# Patient Record
Sex: Female | Born: 1975 | Race: White | Hispanic: No | Marital: Married
Health system: Southern US, Community
[De-identification: ages and names within clinical notes are randomized; demographics above are authoritative.]

## PROBLEM LIST (undated history)

## (undated) DIAGNOSIS — M549 Dorsalgia, unspecified: Secondary | ICD-10-CM

## (undated) DIAGNOSIS — R87619 Unspecified abnormal cytological findings in specimens from cervix uteri: Secondary | ICD-10-CM

## (undated) DIAGNOSIS — E785 Hyperlipidemia, unspecified: Secondary | ICD-10-CM

## (undated) DIAGNOSIS — I10 Essential (primary) hypertension: Secondary | ICD-10-CM

## (undated) HISTORY — PX: OTHER SURGICAL HISTORY: SHX169

## (undated) HISTORY — PX: WISDOM TOOTH EXTRACTION: SHX21

---

## 2000-08-06 ENCOUNTER — Other Ambulatory Visit: Admission: RE | Admit: 2000-08-06 | Discharge: 2000-08-06 | Payer: Self-pay | Admitting: Obstetrics and Gynecology

## 2000-09-08 ENCOUNTER — Inpatient Hospital Stay (HOSPITAL_COMMUNITY): Admission: AD | Admit: 2000-09-08 | Discharge: 2000-09-08 | Payer: Self-pay | Admitting: Obstetrics & Gynecology

## 2000-09-08 ENCOUNTER — Encounter: Payer: Self-pay | Admitting: Obstetrics & Gynecology

## 2001-02-15 ENCOUNTER — Inpatient Hospital Stay (HOSPITAL_COMMUNITY): Admission: AD | Admit: 2001-02-15 | Discharge: 2001-02-18 | Payer: Self-pay | Admitting: Obstetrics and Gynecology

## 2001-02-26 ENCOUNTER — Inpatient Hospital Stay (HOSPITAL_COMMUNITY): Admission: AD | Admit: 2001-02-26 | Discharge: 2001-02-26 | Payer: Self-pay | Admitting: Obstetrics and Gynecology

## 2001-10-16 ENCOUNTER — Emergency Department (HOSPITAL_COMMUNITY): Admission: EM | Admit: 2001-10-16 | Discharge: 2001-10-16 | Payer: Self-pay | Admitting: Emergency Medicine

## 2002-03-07 ENCOUNTER — Other Ambulatory Visit: Admission: RE | Admit: 2002-03-07 | Discharge: 2002-03-07 | Payer: Self-pay | Admitting: Obstetrics and Gynecology

## 2002-03-29 ENCOUNTER — Emergency Department (HOSPITAL_COMMUNITY): Admission: EM | Admit: 2002-03-29 | Discharge: 2002-03-29 | Payer: Self-pay | Admitting: *Deleted

## 2002-03-29 ENCOUNTER — Encounter: Payer: Self-pay | Admitting: *Deleted

## 2002-05-21 ENCOUNTER — Emergency Department (HOSPITAL_COMMUNITY): Admission: EM | Admit: 2002-05-21 | Discharge: 2002-05-21 | Payer: Self-pay | Admitting: *Deleted

## 2003-03-09 ENCOUNTER — Other Ambulatory Visit: Admission: RE | Admit: 2003-03-09 | Discharge: 2003-03-09 | Payer: Self-pay | Admitting: Obstetrics and Gynecology

## 2003-05-08 ENCOUNTER — Emergency Department (HOSPITAL_COMMUNITY): Admission: EM | Admit: 2003-05-08 | Discharge: 2003-05-08 | Payer: Self-pay | Admitting: Emergency Medicine

## 2010-11-14 ENCOUNTER — Other Ambulatory Visit: Payer: Self-pay | Admitting: Obstetrics and Gynecology

## 2010-11-19 ENCOUNTER — Encounter (HOSPITAL_COMMUNITY): Payer: Self-pay | Admitting: *Deleted

## 2010-12-01 NOTE — H&P (Signed)
NAME:  Stacey Montoya, Stacey Montoya NO.:  [id number]  MEDICAL RECORD NO.:  [id number]  LOCATION:  [facility]                         FACILITY:  [facility]  PHYSICIAN:  Randye Lobo, M.D.   DATE OF BIRTH:  12-05-1975  DATE OF ADMISSION:  11/14/2010 DATE OF DISCHARGE:                             HISTORY & PHYSICAL   CHIEF COMPLAINT:  Abnormal Pap smear.  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  The patient subsequently had a colposcopy on November 03, 2010 which was unsatisfactory.  There were no lesions of the cervix appreciated.  Endocervical curettage demonstrated a squamous fragment with CIN-II and otherwise benign endocervical tissue.  PAST OBSTETRIC AND GYNECOLOGIC HISTORY:  Status post cesarean section x1.  The patient has a history of polycystic ovarian disease.  She has oligomenorrhea with menses 7-9 times per year.  She has recently been started on Ortho Micronor for contraception.  PAST MEDICAL HISTORY: 1. Tobacco use.  The patient smokes 1/2 package of cigarettes per day. 2. Hyperlipidemia. 3. Obesity. 4. Potential hypertension.  PAST SURGICAL HISTORY:  Status post cesarean section x1.  MEDICATIONS:  None.  ALLERGIES:  ZITHROMAX.  SOCIAL HISTORY:  The patient has recently moved back to the [place] area from [place].  She has a 41-year-old son.  She smokes 1/2 package of cigarettes per day after previously quitting.  PHYSICAL EXAMINATION:  VITAL SIGNS:  Height 5 feet 4 inches, weight 237 pounds, blood pressure 142/86. NECK:  Negative for adenopathy and thyromegaly. LUNGS:  Clear to auscultation bilaterally. HEART:  S1, S2 with regular rate and rhythm.  No evidence of a murmur, rub, or gallop.  ABDOMEN:  There is a  well-healed Pfannenstiel incision. There is central obesity appreciated.  The abdomen is soft and nontender and without evidence of hepatosplenomegaly or organomegaly.  BREASTS: No dominant masses, skin retractions, axillary adenopathy, or nipple discharge. PELVIC:  Normal external genitalia and urethra.  There are no visible lesions of the cervix or the vagina.  The cervix is very high in the vaginal vault.  The uterus is small and nontender.  No adnexal masses nor tenderness are appreciated.  IMPRESSION:  The patient is a 35 year old, gravida 1, para 1 female with an abnormal Pap smear, nonsatisfactory colposcopic examination, and Discrepancy between a Pap smear and endocervical curettage.  PLAN:  The patient will undergo a cold knife conization of the cervix with a fractional D and C at the [facility] [place] on December 02, 2010.  Risks, benefits, and alternatives have been reviewed with the patient who wishes to proceed.     Randye Lobo, M.D.     BES/MEDQ  D:  12/01/2010  T:  12/01/2010  Job:  [id number]

## 2010-12-02 ENCOUNTER — Ambulatory Visit (HOSPITAL_COMMUNITY)
Admission: RE | Admit: 2010-12-02 | Discharge: 2010-12-02 | Disposition: A | Discharge status: Home / Self Care | Payer: PRIVATE HEALTH INSURANCE | Source: Ambulatory Visit | Attending: Obstetrics and Gynecology | Admitting: Obstetrics and Gynecology

## 2010-12-02 ENCOUNTER — Ambulatory Visit (HOSPITAL_COMMUNITY): Payer: PRIVATE HEALTH INSURANCE | Admitting: Anesthesiology

## 2010-12-02 ENCOUNTER — Encounter (HOSPITAL_COMMUNITY): Payer: Self-pay | Admitting: *Deleted

## 2010-12-02 ENCOUNTER — Encounter (HOSPITAL_COMMUNITY): Payer: Self-pay | Admitting: Anesthesiology

## 2010-12-02 ENCOUNTER — Encounter (HOSPITAL_COMMUNITY)
Admission: RE | Disposition: A | Discharge status: Home / Self Care | Payer: Self-pay | Source: Ambulatory Visit | Attending: Obstetrics and Gynecology

## 2010-12-02 ENCOUNTER — Other Ambulatory Visit: Payer: Self-pay | Admitting: Obstetrics and Gynecology

## 2010-12-02 DIAGNOSIS — D069 Carcinoma in situ of cervix, unspecified: Secondary | ICD-10-CM | POA: Insufficient documentation

## 2010-12-02 HISTORY — DX: Unspecified abnormal cytological findings in specimens from cervix uteri: R87.619

## 2010-12-02 HISTORY — DX: Hyperlipidemia, unspecified: E78.5

## 2010-12-02 HISTORY — PX: DILATION AND CURETTAGE OF UTERUS: SHX78

## 2010-12-02 HISTORY — PX: CERVICAL CONIZATION W/BX: SHX1330

## 2010-12-02 LAB — CBC
MCH: 32 pg (ref 26.0–34.0)
MCV: 98.4 fL (ref 78.0–100.0)
Platelets: 315 10*3/uL (ref 150–400)
RBC: 4.38 MIL/uL (ref 3.87–5.11)

## 2010-12-02 SURGERY — CONE BIOPSY, CERVIX
Anesthesia: Choice

## 2010-12-02 MED ORDER — PROPOFOL 10 MG/ML IV EMUL
INTRAVENOUS | Status: DC | PRN
  Administered 2010-12-02: 250 mg via INTRAVENOUS

## 2010-12-02 MED ORDER — LIDOCAINE HCL (CARDIAC) 20 MG/ML IV SOLN
INTRAVENOUS | Status: DC | PRN
  Administered 2010-12-02: 70 mg via INTRAVENOUS

## 2010-12-02 MED ORDER — LIDOCAINE-EPINEPHRINE 1 %-1:100000 IJ SOLN
INTRAMUSCULAR | Status: DC | PRN
  Administered 2010-12-02: 10 mL

## 2010-12-02 MED ORDER — ONDANSETRON HCL 4 MG/2ML IJ SOLN
INTRAMUSCULAR | Status: DC | PRN
  Administered 2010-12-02: 4 mg via INTRAVENOUS

## 2010-12-02 MED ORDER — OXYCODONE-ACETAMINOPHEN 5-325 MG PO TABS
1.0000 | ORAL_TABLET | ORAL | Status: DC | PRN
  Administered 2010-12-02: 1 via ORAL

## 2010-12-02 MED ORDER — CEFAZOLIN SODIUM 1-5 GM-% IV SOLN: 1.0000 g | Freq: Once | INTRAVENOUS | Status: DC

## 2010-12-02 MED ORDER — LACTATED RINGERS IV SOLN
INTRAVENOUS | Status: DC
  Administered 2010-12-02 (×2): via INTRAVENOUS

## 2010-12-02 MED ORDER — CEFAZOLIN SODIUM 1-5 GM-% IV SOLN
INTRAVENOUS | Status: DC | PRN
  Administered 2010-12-02: 1 g via INTRAVENOUS

## 2010-12-02 MED ORDER — OXYCODONE-ACETAMINOPHEN 5-325 MG PO TABS: 1.0000 | ORAL_TABLET | ORAL | Status: AC | PRN

## 2010-12-02 MED ORDER — CEFAZOLIN SODIUM 1-5 GM-% IV SOLN
INTRAVENOUS | Status: AC
  Filled 2010-12-02: qty 50

## 2010-12-02 MED ORDER — KETOROLAC TROMETHAMINE 30 MG/ML IJ SOLN
INTRAMUSCULAR | Status: DC | PRN
  Administered 2010-12-02: 30 mg via INTRAVENOUS

## 2010-12-02 MED ORDER — MIDAZOLAM HCL 5 MG/5ML IJ SOLN
INTRAMUSCULAR | Status: DC | PRN
  Administered 2010-12-02 (×2): 1 mg via INTRAVENOUS

## 2010-12-02 MED ORDER — DEXAMETHASONE SODIUM PHOSPHATE 4 MG/ML IJ SOLN
INTRAMUSCULAR | Status: DC | PRN
  Administered 2010-12-02: 10 mg via INTRAVENOUS

## 2010-12-02 MED ORDER — FENTANYL CITRATE 0.05 MG/ML IJ SOLN
INTRAMUSCULAR | Status: DC | PRN
  Administered 2010-12-02: 100 ug via INTRAVENOUS

## 2010-12-02 MED ORDER — IBUPROFEN 200 MG PO TABS: 600.0000 mg | ORAL_TABLET | Freq: Four times a day (QID) | ORAL | Status: AC | PRN

## 2010-12-02 MED ORDER — OXYCODONE-ACETAMINOPHEN 5-325 MG PO TABS
ORAL_TABLET | ORAL | Status: AC
  Administered 2010-12-02: 1 via ORAL
  Filled 2010-12-02: qty 1

## 2010-12-02 MED ORDER — KETOROLAC TROMETHAMINE 30 MG/ML IJ SOLN
INTRAMUSCULAR | Status: AC
  Filled 2010-12-02: qty 1

## 2010-12-02 SURGICAL SUPPLY — 30 items
APPLICATOR COTTON TIP 6IN STRL (MISCELLANEOUS) IMPLANT
BLADE SURG 11 STRL SS (BLADE) ×2 IMPLANT
CATH ROBINSON RED A/P 16FR (CATHETERS) ×2 IMPLANT
CLOTH BEACON ORANGE TIMEOUT ST (SAFETY) ×2 IMPLANT
CONTAINER PREFILL 10% NBF 60ML (FORM) ×2 IMPLANT
COUNTER NEEDLE 1200 MAGNETIC (NEEDLE) ×2 IMPLANT
DRAPE UTILITY XL STRL (DRAPES) ×2 IMPLANT
ELECT BALL LEEP 5MM RED (ELECTRODE) ×2 IMPLANT
ELECT REM PT RETURN 9FT ADLT (ELECTROSURGICAL)
ELECTRODE REM PT RTRN 9FT ADLT (ELECTROSURGICAL) IMPLANT
GAUZE SPONGE 4X4 16PLY XRAY LF (GAUZE/BANDAGES/DRESSINGS) IMPLANT
GLOVE BIO SURGEON STRL SZ 6.5 (GLOVE) ×4 IMPLANT
GOWN PREVENTION PLUS LG XLONG (DISPOSABLE) ×4 IMPLANT
NEEDLE SPNL 22GX3.5 QUINCKE BK (NEEDLE) IMPLANT
NS IRRIG 1000ML POUR BTL (IV SOLUTION) ×2 IMPLANT
PACK VAGINAL MINOR WOMEN LF (CUSTOM PROCEDURE TRAY) ×2 IMPLANT
PAD PREP 24X48 CUFFED NSTRL (MISCELLANEOUS) ×2 IMPLANT
PENCIL BUTTON HOLSTER BLD 10FT (ELECTRODE) ×2 IMPLANT
SCOPETTES 8  STERILE (MISCELLANEOUS) ×1
SCOPETTES 8 STERILE (MISCELLANEOUS) ×1 IMPLANT
SPONGE SURGIFOAM ABS GEL 12-7 (HEMOSTASIS) IMPLANT
SUT CHROMIC 1 CT1 27 (SUTURE) ×6 IMPLANT
SUT SILK 2 0 SH (SUTURE) ×2 IMPLANT
SUT VIC AB 0 CT1 27 (SUTURE)
SUT VIC AB 0 CT1 27XBRD ANBCTR (SUTURE) IMPLANT
SYR CONTROL 10ML LL (SYRINGE) ×2 IMPLANT
TOWEL OR 17X24 6PK STRL BLUE (TOWEL DISPOSABLE) ×4 IMPLANT
TUBING NON-CON 1/4 X 20 CONN (TUBING) ×2 IMPLANT
WATER STERILE IRR 1000ML POUR (IV SOLUTION) ×2 IMPLANT
YANKAUER SUCT BULB TIP NO VENT (SUCTIONS) ×2 IMPLANT

## 2010-12-02 NOTE — Progress Notes (Signed)
Pre-op Note  No marked change in status since office pre-op visit. 

## 2010-12-02 NOTE — Anesthesia Postprocedure Evaluation (Signed)
Anesthesia Post Note  Patient: Stacey Montoya  Procedure(s) Performed:  CONIZATION CERVIX WITH BIOPSY - COLD KNIFE; DILATATION AND CURETTAGE (D&C)  Anesthesia type: General  Patient location: PACU  Post pain: Pain level controlled  Post assessment: Post-op Vital signs reviewed  Last Vitals:  Filed Vitals:   12/02/10 1235  BP:   Pulse: 99  Temp: 98.4 F (36.9 C)  Resp: 15    Post vital signs: Reviewed  Level of consciousness: sedated  Complications: No apparent anesthesia complications

## 2010-12-02 NOTE — Transfer of Care (Signed)
Immediate Anesthesia Transfer of Care Note  Patient: Stacey Montoya  Procedure(s) Performed:  CONIZATION CERVIX WITH BIOPSY - COLD KNIFE; DILATATION AND CURETTAGE (D&C)  Patient Location: PACU  Anesthesia Type: General  Level of Consciousness: awake and alert   Airway & Oxygen Therapy: Patient Spontanous Breathing and Patient connected to nasal cannula oxygen  Post-op Assessment: Report given to PACU RN and Post -op Vital signs reviewed and stable  Post vital signs: Reviewed and stable  Complications: No apparent anesthesia complications

## 2010-12-02 NOTE — Op Note (Signed)
NAME:  Stacey Montoya, Stacey Montoya NO.:  [id number]  MEDICAL RECORD NO.:  [id number]  LOCATION:  [facility]                          FACILITY:  [facility]  PHYSICIAN:  Randye Lobo, M.D.   DATE OF BIRTH:  Dec 07, 1975  DATE OF PROCEDURE:  12/02/2010 DATE OF DISCHARGE:  12/02/2010                              OPERATIVE REPORT   PREOPERATIVE DIAGNOSES: 1. Unsatisfactory colposcopy 2. Discrepancy between Pap smear and colposcopic biopsy.  POSTOPERATIVE DIAGNOSES: 1. Unsatisfactory colposcopy. 2. Discrepancy between Pap smear and colposcopic biopsies.  PROCEDURE:  Cold knife conization of the cervix, fractional D and C.  SURGEON:  Randye Lobo, MD  ANESTHESIA:  General endotracheal, local with 1% lidocaine with epinephrine 1:100,000.  IV FLUIDS:  __________  ESTIMATED BLOOD LOSS:  Minimal.  URINE OUTPUT:  200 mL prior to procedure.  COMPLICATIONS:  None.  INDICATIONS FOR THE PROCEDURE:  The patient is a 35 year old, gravida 1, para 1 Caucasian female with a history of a prior cesarean section, who presents for a routine examination.  The patient has not had a Pap smear since 2004.  The patient was noted to have CIN-II/CIN-III/carcinoma in situ on her Pap smear performed on October 10, 2010.  Colposcopy was performed on November 03, 2010, and this was unsatisfactory.  No lesions were seen.  Endocervical curettage demonstrated a squamous fragment was CIN-II and otherwise benign endocervical tissue.  The cervix was noted to be very high in the vagina.  Recommendation was made to proceed with a cold knife conization of the cervix after risks, benefits, and alternatives were reviewed.  FINDINGS:  Examination under anesthesia revealed a visibly and palpably normal cervix.  Monsel's solution was applied to the cervix after placement of the speculum.  The squamocolumnar junction was visualized only anteriorly.  SPECIMENS:  Conization of cervix, endocervical curettings, and  endometrial curettings were sent to pathology separately.  PROCEDURE:  The patient was sterilely prepped and draped.  A red rubber catheter was used to empty the bladder.  Lugol's solution was used to paint the cervix. Local 1% lidocaine was injected circumferentially to the cervix.  Figure-of-8 sutures of #1 chromic were placed at the 3 and the 9 o'clock positions along the descending branches of the uterine arteries along the cervix.  Deaver retractors were used to retract the vaginal sidewalls and a tenaculum was placed on the cervix anteriorly.  A scalpel was then used to perform the cold knife conization, which was performed without difficulty.  The specimen was sharply excised at the base and the cervix was marked at the 12 o'clock position.  The endocervix was curetted, and the specimen was sent to pathology.  The endometrium was similarly curetted, and the specimen was sent to pathology.  The surgical bed of the conization was then cauterized with monopolar cautery without cauterizing the endocervix.  Hemostasis was noted to be excellent. Monsel's solution was applied.  All traction was taken off the cervix, and there was still good hemostasis.  The procedure was therefore completed at this time.  All of the vaginal instruments were removed.  The patient was awakened and extubated and escorted to the recovery room in stable condition. There were no complications to the procedure.  All needle, instrument, and sponge counts were correct.     Randye Lobo, M.D.     BES/MEDQ  D:  12/02/2010  T:  12/02/2010  Job:  [id number]

## 2010-12-02 NOTE — H&P (Signed)
NAME:  TERRINA, DOCTER NO.:  [id number]      MEDICAL RECORD NO.:  [id number]      LOCATION:  [facility]                         [facility]      PHYSICIAN:  Randye Lobo, M.D.   DATE OF BIRTH:  November 24, 1975      DATE OF ADMISSION:  11/14/2010   DATE OF DISCHARGE:                                 HISTORY & PHYSICAL         CHIEF COMPLAINT:  Abnormal Pap smear.      HISTORY OF PRESENT ILLNESS:  The patient is a 35 year old, gravida 1,   para 1 Caucasian female who presented for a routine examination on   October 10, 2010 at which time she was noted to have a Pap smear showing   high-grade squamous intraepithelial lesion:  CIN-II/CIN-III/carcinoma in   situ.  The patient subsequently had a colposcopy on November 03, 2010   which was unsatisfactory.  There were no lesions of the cervix   appreciated.  Endocervical curettage demonstrated a squamous fragment   with CIN-II and otherwise benign endocervical tissue.      PAST OBSTETRIC AND GYNECOLOGIC HISTORY:  Status post cesarean section   x1.  The patient has a history of polycystic ovarian disease.  She has   oligomenorrhea with menses 7-9 times per year.  She has recently been   started on Ortho Micronor for contraception.      PAST MEDICAL HISTORY:   1. Tobacco use.  The patient smokes 1/2 package of cigarettes per day.   2. Hyperlipidemia.   3. Obesity.   4. Potential hypertension.      PAST SURGICAL HISTORY:  Status post cesarean section x1.      MEDICATIONS:  None.      ALLERGIES:  ZITHROMAX.      SOCIAL HISTORY:  The patient has recently moved back to the [place]   area from [place].  She has a 37-year-old son.  She smokes 1/2 package   of cigarettes per day after previously quitting.      PHYSICAL EXAMINATION:  VITAL SIGNS:  Height 5 feet 4 inches, weight 237   pounds, blood pressure 142/86.   NECK:  Negative for adenopathy and thyromegaly.   LUNGS:  Clear to auscultation bilaterally.   HEART:   S1, S2 with regular rate and rhythm.  No evidence of a murmur,   rub, or gallop.  ABDOMEN:  There is a well-healed Pfannenstiel incision.   There is central obesity appreciated.  The abdomen is soft and nontender   and without evidence of hepatosplenomegaly or organomegaly.  BREASTS:   No dominant masses, skin retractions, axillary adenopathy, or nipple   discharge.   PELVIC:  Normal external genitalia and urethra.  There are no visible   lesions of the cervix or the vagina.  The cervix is very high in the   vaginal vault.  The uterus is small and nontender.  No adnexal masses   nor tenderness are appreciated.      IMPRESSION:  The patient is a 35 year old, gravida 1, para 1 female with   an abnormal Pap smear, nonsatisfactory colposcopic examination, and  potential discrepancy between a Pap smear and endocervical curettage.      PLAN:  The patient will undergo a cold knife conization of the cervix   with a fractional D and C at the [facility] [place] on   December 02, 2010.  Risks, benefits, and alternatives have been reviewed   with the patient who wishes to proceed.               Randye Lobo, M.D.               BES/MEDQ  D:  12/01/2010  T:  12/01/2010  Job:  [id number]

## 2010-12-02 NOTE — Anesthesia Preprocedure Evaluation (Addendum)
Anesthesia Evaluation  Name, MR# and DOB Patient awake  General Assessment Comment  Reviewed: Allergy & Precautions, H&P , NPO status , Patient's Chart, lab work & pertinent test results, reviewed documented beta blocker date and time   History of Anesthesia Complications Negative for: history of anesthetic complications  Airway Mallampati: I TM Distance: >3 FB Neck ROM: full    Dental  (+) Teeth Intact   Pulmonary Current Smoker  clear to auscultation  Pulmonary exam normal       Cardiovascular Exercise Tolerance: Good regular Normal    Neuro/Psych Negative Neurological ROS  Negative Psych ROS   GI/Hepatic negative GI ROS Neg liver ROS    Endo/Other  Morbid obesityPCOS  Renal/GU      Musculoskeletal   Abdominal   Peds  Hematology negative hematology ROS (+)   Anesthesia Other Findings   Reproductive/Obstetrics negative OB ROS                          Anesthesia Physical Anesthesia Plan  ASA: II  Anesthesia Plan: General   Post-op Pain Management:    Induction:   Airway Management Planned:   Additional Equipment:   Intra-op Plan:   Post-operative Plan:   Informed Consent: I have reviewed the patients History and Physical, chart, labs and discussed the procedure including the risks, benefits and alternatives for the proposed anesthesia with the patient or authorized representative who has indicated his/her understanding and acceptance.   Dental Advisory Given  Plan Discussed with: CRNA and Surgeon  Anesthesia Plan Comments:         Anesthesia Quick Evaluation

## 2010-12-02 NOTE — Brief Op Note (Signed)
12/02/2010  11:43 AM  PATIENT:  Stacey Montoya  35 y.o. female  PRE-OPERATIVE DIAGNOSIS:  DISCREPANCY BETWEEN PAP AND COLPOSCOPIC BIOPSIES UNSATISFACTORY COLPOSCOPY  POST-OPERATIVE DIAGNOSIS:   DISCREPANCY BETWEEN PAP AND COLPOSCOPIC BIOPSIES UNSATISFACTORY COLPOSCOPY    PROCEDURE:  Procedure(s): CONIZATION CERVIX WITH BIOPSY FRACTIONAL DILATATION AND CURETTAGE (D&C)  SURGEON:  Surgeon(s): Brook A Silva     PHYSICIAN ASSISTANT:   ASSISTANTS:    ANESTHESIA:   general, paracervical block 1% lidocaine with epinephrine 1:100,000  EBL:  Total I/O In: 1000 [I.V.:1000] Out: -   BLOOD ADMINISTERED:none  DRAINS: none   LOCAL MEDICATIONS USED:  LIDOCAINE 10 CC  SPECIMEN:  Source of Specimen:  Conization of cervix - marked at 12:00, endocervical and endometrial curettings  DISPOSITION OF SPECIMEN:  PATHOLOGY  COUNTS:  YES  TOURNIQUET:  * No tourniquets in log *  DICTATION: .Other Dictation: Dictation Number   PLAN OF CARE: Discharge to home after PACU  PATIENT DISPOSITION:  PACU - hemodynamically stable.   Delay start of Pharmacological VTE agent (>24hrs) due to surgical blood loss or risk of bleeding:  not applicable

## 2010-12-08 ENCOUNTER — Encounter (HOSPITAL_COMMUNITY): Payer: Self-pay | Admitting: Obstetrics and Gynecology

## 2011-07-31 ENCOUNTER — Other Ambulatory Visit: Payer: Self-pay | Admitting: Obstetrics and Gynecology

## 2013-03-02 ENCOUNTER — Other Ambulatory Visit: Payer: Self-pay | Admitting: Obstetrics and Gynecology

## 2014-03-06 ENCOUNTER — Other Ambulatory Visit: Payer: Self-pay | Admitting: Obstetrics and Gynecology

## 2014-03-07 LAB — CYTOLOGY - PAP

## 2015-07-19 ENCOUNTER — Other Ambulatory Visit: Payer: Self-pay | Admitting: Obstetrics and Gynecology

## 2015-07-22 LAB — CYTOLOGY - PAP

## 2015-11-25 DIAGNOSIS — Z6839 Body mass index (BMI) 39.0-39.9, adult: Secondary | ICD-10-CM | POA: Diagnosis not present

## 2015-11-25 DIAGNOSIS — R6889 Other general symptoms and signs: Secondary | ICD-10-CM | POA: Diagnosis not present

## 2016-07-01 DIAGNOSIS — D485 Neoplasm of uncertain behavior of skin: Secondary | ICD-10-CM | POA: Diagnosis not present

## 2016-07-01 DIAGNOSIS — D2361 Other benign neoplasm of skin of right upper limb, including shoulder: Secondary | ICD-10-CM | POA: Diagnosis not present

## 2016-09-15 DIAGNOSIS — Z01419 Encounter for gynecological examination (general) (routine) without abnormal findings: Secondary | ICD-10-CM | POA: Diagnosis not present

## 2016-09-15 DIAGNOSIS — Z124 Encounter for screening for malignant neoplasm of cervix: Secondary | ICD-10-CM | POA: Diagnosis not present

## 2016-09-15 DIAGNOSIS — Z6841 Body Mass Index (BMI) 40.0 and over, adult: Secondary | ICD-10-CM | POA: Diagnosis not present

## 2016-10-28 DIAGNOSIS — J209 Acute bronchitis, unspecified: Secondary | ICD-10-CM | POA: Diagnosis not present

## 2016-10-28 DIAGNOSIS — J329 Chronic sinusitis, unspecified: Secondary | ICD-10-CM | POA: Diagnosis not present

## 2016-10-28 DIAGNOSIS — J069 Acute upper respiratory infection, unspecified: Secondary | ICD-10-CM | POA: Diagnosis not present

## 2017-07-27 DIAGNOSIS — H5213 Myopia, bilateral: Secondary | ICD-10-CM | POA: Diagnosis not present

## 2017-07-27 DIAGNOSIS — H52223 Regular astigmatism, bilateral: Secondary | ICD-10-CM | POA: Diagnosis not present

## 2017-09-14 DIAGNOSIS — J069 Acute upper respiratory infection, unspecified: Secondary | ICD-10-CM | POA: Diagnosis not present

## 2017-09-14 DIAGNOSIS — J029 Acute pharyngitis, unspecified: Secondary | ICD-10-CM | POA: Diagnosis not present

## 2017-10-27 DIAGNOSIS — N879 Dysplasia of cervix uteri, unspecified: Secondary | ICD-10-CM | POA: Diagnosis not present

## 2017-11-05 DIAGNOSIS — Z0000 Encounter for general adult medical examination without abnormal findings: Secondary | ICD-10-CM | POA: Diagnosis not present

## 2017-11-11 DIAGNOSIS — Z720 Tobacco use: Secondary | ICD-10-CM | POA: Diagnosis not present

## 2017-11-11 DIAGNOSIS — Z0000 Encounter for general adult medical examination without abnormal findings: Secondary | ICD-10-CM | POA: Diagnosis not present

## 2017-11-11 DIAGNOSIS — N879 Dysplasia of cervix uteri, unspecified: Secondary | ICD-10-CM | POA: Diagnosis not present

## 2017-11-11 DIAGNOSIS — R7301 Impaired fasting glucose: Secondary | ICD-10-CM | POA: Diagnosis not present

## 2017-12-27 DIAGNOSIS — Z6841 Body Mass Index (BMI) 40.0 and over, adult: Secondary | ICD-10-CM | POA: Diagnosis not present

## 2017-12-27 DIAGNOSIS — Z01419 Encounter for gynecological examination (general) (routine) without abnormal findings: Secondary | ICD-10-CM | POA: Diagnosis not present

## 2017-12-27 DIAGNOSIS — Z124 Encounter for screening for malignant neoplasm of cervix: Secondary | ICD-10-CM | POA: Diagnosis not present

## 2018-01-10 DIAGNOSIS — Z1231 Encounter for screening mammogram for malignant neoplasm of breast: Secondary | ICD-10-CM | POA: Diagnosis not present

## 2018-01-10 DIAGNOSIS — Z6841 Body Mass Index (BMI) 40.0 and over, adult: Secondary | ICD-10-CM | POA: Diagnosis not present

## 2018-03-10 ENCOUNTER — Ambulatory Visit (HOSPITAL_COMMUNITY)
Admission: RE | Admit: 2018-03-10 | Discharge: 2018-03-10 | Disposition: A | Discharge status: Home / Self Care | Payer: BLUE CROSS/BLUE SHIELD | Source: Ambulatory Visit | Attending: Internal Medicine | Admitting: Internal Medicine

## 2018-03-10 ENCOUNTER — Other Ambulatory Visit (HOSPITAL_COMMUNITY): Payer: Self-pay | Admitting: Internal Medicine

## 2018-03-10 DIAGNOSIS — R0782 Intercostal pain: Secondary | ICD-10-CM

## 2018-03-10 DIAGNOSIS — R0781 Pleurodynia: Secondary | ICD-10-CM | POA: Diagnosis not present

## 2018-03-10 DIAGNOSIS — R079 Chest pain, unspecified: Secondary | ICD-10-CM | POA: Diagnosis not present

## 2018-05-16 DIAGNOSIS — R030 Elevated blood-pressure reading, without diagnosis of hypertension: Secondary | ICD-10-CM | POA: Diagnosis not present

## 2018-05-16 DIAGNOSIS — E782 Mixed hyperlipidemia: Secondary | ICD-10-CM | POA: Diagnosis not present

## 2019-01-27 ENCOUNTER — Other Ambulatory Visit: Payer: Self-pay

## 2019-01-27 DIAGNOSIS — Z20822 Contact with and (suspected) exposure to covid-19: Secondary | ICD-10-CM

## 2019-01-28 LAB — NOVEL CORONAVIRUS, NAA: SARS-CoV-2, NAA: NOT DETECTED

## 2019-04-11 DIAGNOSIS — Z6841 Body Mass Index (BMI) 40.0 and over, adult: Secondary | ICD-10-CM | POA: Diagnosis not present

## 2019-04-11 DIAGNOSIS — Z3202 Encounter for pregnancy test, result negative: Secondary | ICD-10-CM | POA: Diagnosis not present

## 2019-04-11 DIAGNOSIS — Z124 Encounter for screening for malignant neoplasm of cervix: Secondary | ICD-10-CM | POA: Diagnosis not present

## 2019-04-11 DIAGNOSIS — Z01419 Encounter for gynecological examination (general) (routine) without abnormal findings: Secondary | ICD-10-CM | POA: Diagnosis not present

## 2019-04-12 ENCOUNTER — Other Ambulatory Visit: Payer: Self-pay | Admitting: Obstetrics and Gynecology

## 2019-04-12 DIAGNOSIS — N6320 Unspecified lump in the left breast, unspecified quadrant: Secondary | ICD-10-CM

## 2019-04-18 ENCOUNTER — Ambulatory Visit
Admission: RE | Admit: 2019-04-18 | Discharge: 2019-04-18 | Disposition: A | Discharge status: Home / Self Care | Payer: BC Managed Care – PPO | Source: Ambulatory Visit | Attending: Obstetrics and Gynecology | Admitting: Obstetrics and Gynecology

## 2019-04-18 ENCOUNTER — Other Ambulatory Visit: Payer: Self-pay

## 2019-04-18 ENCOUNTER — Ambulatory Visit
Admission: RE | Admit: 2019-04-18 | Discharge: 2019-04-18 | Disposition: A | Discharge status: Home / Self Care | Payer: BLUE CROSS/BLUE SHIELD | Source: Ambulatory Visit | Attending: Obstetrics and Gynecology | Admitting: Obstetrics and Gynecology

## 2019-04-18 DIAGNOSIS — N6320 Unspecified lump in the left breast, unspecified quadrant: Secondary | ICD-10-CM

## 2019-04-18 DIAGNOSIS — R928 Other abnormal and inconclusive findings on diagnostic imaging of breast: Secondary | ICD-10-CM | POA: Diagnosis not present

## 2019-04-18 DIAGNOSIS — N6489 Other specified disorders of breast: Secondary | ICD-10-CM | POA: Diagnosis not present

## 2019-05-15 ENCOUNTER — Ambulatory Visit: Payer: BC Managed Care – PPO | Attending: Internal Medicine

## 2019-05-15 ENCOUNTER — Other Ambulatory Visit: Payer: Self-pay

## 2019-05-15 DIAGNOSIS — Z20822 Contact with and (suspected) exposure to covid-19: Secondary | ICD-10-CM

## 2019-05-17 LAB — NOVEL CORONAVIRUS, NAA: SARS-CoV-2, NAA: NOT DETECTED

## 2019-05-17 LAB — SARS-COV-2, NAA 2 DAY TAT

## 2019-08-29 DIAGNOSIS — M25521 Pain in right elbow: Secondary | ICD-10-CM | POA: Diagnosis not present

## 2019-08-29 DIAGNOSIS — R42 Dizziness and giddiness: Secondary | ICD-10-CM | POA: Diagnosis not present

## 2019-09-12 DIAGNOSIS — Z716 Tobacco abuse counseling: Secondary | ICD-10-CM | POA: Diagnosis not present

## 2019-09-12 DIAGNOSIS — Z720 Tobacco use: Secondary | ICD-10-CM | POA: Diagnosis not present

## 2019-09-12 DIAGNOSIS — Z0001 Encounter for general adult medical examination with abnormal findings: Secondary | ICD-10-CM | POA: Diagnosis not present

## 2020-02-01 DIAGNOSIS — J209 Acute bronchitis, unspecified: Secondary | ICD-10-CM | POA: Diagnosis not present

## 2020-02-01 DIAGNOSIS — J069 Acute upper respiratory infection, unspecified: Secondary | ICD-10-CM | POA: Diagnosis not present

## 2020-02-07 DIAGNOSIS — M9901 Segmental and somatic dysfunction of cervical region: Secondary | ICD-10-CM | POA: Diagnosis not present

## 2020-02-07 DIAGNOSIS — M9902 Segmental and somatic dysfunction of thoracic region: Secondary | ICD-10-CM | POA: Diagnosis not present

## 2020-02-07 DIAGNOSIS — M546 Pain in thoracic spine: Secondary | ICD-10-CM | POA: Diagnosis not present

## 2020-02-17 DIAGNOSIS — I8290 Acute embolism and thrombosis of unspecified vein: Secondary | ICD-10-CM

## 2020-02-17 HISTORY — DX: Acute embolism and thrombosis of unspecified vein: I82.90

## 2020-02-21 DIAGNOSIS — M9901 Segmental and somatic dysfunction of cervical region: Secondary | ICD-10-CM | POA: Diagnosis not present

## 2020-02-21 DIAGNOSIS — M9902 Segmental and somatic dysfunction of thoracic region: Secondary | ICD-10-CM | POA: Diagnosis not present

## 2020-02-21 DIAGNOSIS — M546 Pain in thoracic spine: Secondary | ICD-10-CM | POA: Diagnosis not present

## 2020-02-27 DIAGNOSIS — M545 Low back pain, unspecified: Secondary | ICD-10-CM | POA: Diagnosis not present

## 2020-03-07 DIAGNOSIS — S22080D Wedge compression fracture of T11-T12 vertebra, subsequent encounter for fracture with routine healing: Secondary | ICD-10-CM | POA: Diagnosis not present

## 2020-03-07 DIAGNOSIS — M545 Low back pain, unspecified: Secondary | ICD-10-CM | POA: Diagnosis not present

## 2020-04-02 ENCOUNTER — Inpatient Hospital Stay (HOSPITAL_COMMUNITY)
Admission: EM | Admit: 2020-04-02 | Discharge: 2020-04-09 | DRG: 840 | Disposition: A | Discharge status: Home / Self Care | Payer: BC Managed Care – PPO | Attending: Internal Medicine | Admitting: Internal Medicine

## 2020-04-02 ENCOUNTER — Encounter (HOSPITAL_COMMUNITY): Payer: Self-pay | Admitting: Emergency Medicine

## 2020-04-02 ENCOUNTER — Inpatient Hospital Stay (HOSPITAL_COMMUNITY): Payer: BC Managed Care – PPO

## 2020-04-02 ENCOUNTER — Emergency Department (HOSPITAL_COMMUNITY): Payer: BC Managed Care – PPO

## 2020-04-02 ENCOUNTER — Other Ambulatory Visit: Payer: Self-pay

## 2020-04-02 DIAGNOSIS — E876 Hypokalemia: Secondary | ICD-10-CM | POA: Diagnosis present

## 2020-04-02 DIAGNOSIS — F17290 Nicotine dependence, other tobacco product, uncomplicated: Secondary | ICD-10-CM | POA: Diagnosis present

## 2020-04-02 DIAGNOSIS — J9601 Acute respiratory failure with hypoxia: Secondary | ICD-10-CM | POA: Diagnosis present

## 2020-04-02 DIAGNOSIS — E559 Vitamin D deficiency, unspecified: Secondary | ICD-10-CM | POA: Diagnosis present

## 2020-04-02 DIAGNOSIS — K5903 Drug induced constipation: Secondary | ICD-10-CM | POA: Diagnosis present

## 2020-04-02 DIAGNOSIS — I10 Essential (primary) hypertension: Secondary | ICD-10-CM | POA: Diagnosis present

## 2020-04-02 DIAGNOSIS — M899 Disorder of bone, unspecified: Secondary | ICD-10-CM | POA: Diagnosis present

## 2020-04-02 DIAGNOSIS — E861 Hypovolemia: Secondary | ICD-10-CM | POA: Diagnosis present

## 2020-04-02 DIAGNOSIS — R0902 Hypoxemia: Secondary | ICD-10-CM | POA: Diagnosis present

## 2020-04-02 DIAGNOSIS — N179 Acute kidney failure, unspecified: Principal | ICD-10-CM

## 2020-04-02 DIAGNOSIS — M25552 Pain in left hip: Secondary | ICD-10-CM | POA: Diagnosis present

## 2020-04-02 DIAGNOSIS — Z881 Allergy status to other antibiotic agents status: Secondary | ICD-10-CM

## 2020-04-02 DIAGNOSIS — I160 Hypertensive urgency: Secondary | ICD-10-CM | POA: Diagnosis not present

## 2020-04-02 DIAGNOSIS — C9000 Multiple myeloma not having achieved remission: Principal | ICD-10-CM | POA: Diagnosis present

## 2020-04-02 DIAGNOSIS — R000 Tachycardia, unspecified: Secondary | ICD-10-CM

## 2020-04-02 DIAGNOSIS — Z7189 Other specified counseling: Secondary | ICD-10-CM

## 2020-04-02 DIAGNOSIS — R7989 Other specified abnormal findings of blood chemistry: Secondary | ICD-10-CM | POA: Diagnosis present

## 2020-04-02 DIAGNOSIS — D649 Anemia, unspecified: Secondary | ICD-10-CM | POA: Diagnosis not present

## 2020-04-02 DIAGNOSIS — Z20822 Contact with and (suspected) exposure to covid-19: Secondary | ICD-10-CM | POA: Diagnosis present

## 2020-04-02 DIAGNOSIS — Z79899 Other long term (current) drug therapy: Secondary | ICD-10-CM

## 2020-04-02 DIAGNOSIS — M8458XA Pathological fracture in neoplastic disease, other specified site, initial encounter for fracture: Secondary | ICD-10-CM | POA: Diagnosis present

## 2020-04-02 DIAGNOSIS — C7951 Secondary malignant neoplasm of bone: Secondary | ICD-10-CM

## 2020-04-02 DIAGNOSIS — I361 Nonrheumatic tricuspid (valve) insufficiency: Secondary | ICD-10-CM | POA: Diagnosis not present

## 2020-04-02 DIAGNOSIS — E860 Dehydration: Secondary | ICD-10-CM | POA: Diagnosis present

## 2020-04-02 DIAGNOSIS — I248 Other forms of acute ischemic heart disease: Secondary | ICD-10-CM | POA: Diagnosis present

## 2020-04-02 DIAGNOSIS — M48061 Spinal stenosis, lumbar region without neurogenic claudication: Secondary | ICD-10-CM | POA: Diagnosis present

## 2020-04-02 DIAGNOSIS — J810 Acute pulmonary edema: Secondary | ICD-10-CM | POA: Diagnosis present

## 2020-04-02 DIAGNOSIS — E872 Acidosis: Secondary | ICD-10-CM | POA: Diagnosis present

## 2020-04-02 DIAGNOSIS — R0602 Shortness of breath: Secondary | ICD-10-CM

## 2020-04-02 DIAGNOSIS — E871 Hypo-osmolality and hyponatremia: Secondary | ICD-10-CM | POA: Diagnosis not present

## 2020-04-02 DIAGNOSIS — S3991XA Unspecified injury of abdomen, initial encounter: Secondary | ICD-10-CM

## 2020-04-02 DIAGNOSIS — D72829 Elevated white blood cell count, unspecified: Secondary | ICD-10-CM

## 2020-04-02 DIAGNOSIS — R631 Polydipsia: Secondary | ICD-10-CM | POA: Diagnosis present

## 2020-04-02 DIAGNOSIS — N920 Excessive and frequent menstruation with regular cycle: Secondary | ICD-10-CM | POA: Diagnosis present

## 2020-04-02 DIAGNOSIS — M549 Dorsalgia, unspecified: Secondary | ICD-10-CM

## 2020-04-02 DIAGNOSIS — Z5111 Encounter for antineoplastic chemotherapy: Secondary | ICD-10-CM

## 2020-04-02 DIAGNOSIS — D630 Anemia in neoplastic disease: Secondary | ICD-10-CM | POA: Diagnosis present

## 2020-04-02 DIAGNOSIS — G893 Neoplasm related pain (acute) (chronic): Secondary | ICD-10-CM | POA: Diagnosis present

## 2020-04-02 DIAGNOSIS — R748 Abnormal levels of other serum enzymes: Secondary | ICD-10-CM | POA: Diagnosis present

## 2020-04-02 DIAGNOSIS — R52 Pain, unspecified: Secondary | ICD-10-CM

## 2020-04-02 DIAGNOSIS — K5909 Other constipation: Secondary | ICD-10-CM | POA: Diagnosis present

## 2020-04-02 DIAGNOSIS — M5410 Radiculopathy, site unspecified: Secondary | ICD-10-CM | POA: Diagnosis present

## 2020-04-02 DIAGNOSIS — M545 Low back pain, unspecified: Secondary | ICD-10-CM | POA: Diagnosis not present

## 2020-04-02 DIAGNOSIS — R29898 Other symptoms and signs involving the musculoskeletal system: Secondary | ICD-10-CM | POA: Diagnosis not present

## 2020-04-02 DIAGNOSIS — R222 Localized swelling, mass and lump, trunk: Secondary | ICD-10-CM | POA: Diagnosis not present

## 2020-04-02 DIAGNOSIS — M79651 Pain in right thigh: Secondary | ICD-10-CM | POA: Diagnosis not present

## 2020-04-02 DIAGNOSIS — C7931 Secondary malignant neoplasm of brain: Secondary | ICD-10-CM | POA: Diagnosis not present

## 2020-04-02 DIAGNOSIS — M546 Pain in thoracic spine: Secondary | ICD-10-CM | POA: Diagnosis not present

## 2020-04-02 DIAGNOSIS — M79652 Pain in left thigh: Secondary | ICD-10-CM | POA: Diagnosis not present

## 2020-04-02 DIAGNOSIS — M8448XA Pathological fracture, other site, initial encounter for fracture: Secondary | ICD-10-CM | POA: Diagnosis not present

## 2020-04-02 DIAGNOSIS — M898X9 Other specified disorders of bone, unspecified site: Secondary | ICD-10-CM | POA: Diagnosis present

## 2020-04-02 DIAGNOSIS — C801 Malignant (primary) neoplasm, unspecified: Secondary | ICD-10-CM

## 2020-04-02 DIAGNOSIS — S2241XA Multiple fractures of ribs, right side, initial encounter for closed fracture: Secondary | ICD-10-CM | POA: Diagnosis not present

## 2020-04-02 HISTORY — DX: Dorsalgia, unspecified: M54.9

## 2020-04-02 HISTORY — DX: Malignant (primary) neoplasm, unspecified: C80.1

## 2020-04-02 LAB — I-STAT BETA HCG BLOOD, ED (MC, WL, AP ONLY): I-stat hCG, quantitative: <5 m[IU]/mL (ref <5)

## 2020-04-02 LAB — HIV ANTIBODY (ROUTINE TESTING W REFLEX): HIV Screen 4th Generation wRfx: NONREACTIVE

## 2020-04-02 LAB — BASIC METABOLIC PANEL
Anion gap: 8 (ref 5–15)
Anion gap: 8 (ref 5–15)
Anion gap: 5 (ref 5–15)
BUN: 53 mg/dL — ABNORMAL HIGH (ref 6–20)
BUN: 53 mg/dL — ABNORMAL HIGH (ref 6–20)
BUN: 49 mg/dL — ABNORMAL HIGH (ref 6–20)
CO2: 23 mmol/L (ref 22–32)
CO2: 24 mmol/L (ref 22–32)
CO2: 25 mmol/L (ref 22–32)
Calcium: 14.5 mg/dL (ref 8.9–10.3)
Calcium: 14.5 mg/dL (ref 8.9–10.3)
Calcium: 13.4 mg/dL (ref 8.9–10.3)
Chloride: 94 mmol/L — ABNORMAL LOW (ref 98–111)
Chloride: 92 mmol/L — ABNORMAL LOW (ref 98–111)
Chloride: 97 mmol/L — ABNORMAL LOW (ref 98–111)
Creatinine, Ser: 3.37 mg/dL — ABNORMAL HIGH (ref 0.44–1.00)
Creatinine, Ser: 3.38 mg/dL — ABNORMAL HIGH (ref 0.44–1.00)
Creatinine, Ser: 3.02 mg/dL — ABNORMAL HIGH (ref 0.44–1.00)
GFR, Estimated: 17 mL/min — ABNORMAL LOW (ref >60)
GFR, Estimated: 17 mL/min — ABNORMAL LOW (ref >60)
GFR, Estimated: 19 mL/min — ABNORMAL LOW (ref >60)
Glucose, Bld: 101 mg/dL — ABNORMAL HIGH (ref 70–99)
Glucose, Bld: 105 mg/dL — ABNORMAL HIGH (ref 70–99)
Glucose, Bld: 98 mg/dL (ref 70–99)
Potassium: 3.9 mmol/L (ref 3.5–5.1)
Potassium: 3.3 mmol/L — ABNORMAL LOW (ref 3.5–5.1)
Potassium: 3.7 mmol/L (ref 3.5–5.1)
Sodium: 125 mmol/L — ABNORMAL LOW (ref 135–145)
Sodium: 124 mmol/L — ABNORMAL LOW (ref 135–145)
Sodium: 127 mmol/L — ABNORMAL LOW (ref 135–145)

## 2020-04-02 LAB — CBC
HCT: 29.5 % — ABNORMAL LOW (ref 36.0–46.0)
Hemoglobin: 9.7 g/dL — ABNORMAL LOW (ref 12.0–15.0)
MCH: 31.7 pg (ref 26.0–34.0)
MCHC: 32.9 g/dL (ref 30.0–36.0)
MCV: 96.4 fL (ref 80.0–100.0)
Platelets: 387 10*3/uL (ref 150–400)
RBC: 3.06 MIL/uL — ABNORMAL LOW (ref 3.87–5.11)
RDW: 14.8 % (ref 11.5–15.5)
WBC: 14.5 10*3/uL — ABNORMAL HIGH (ref 4.0–10.5)
nRBC: 0 % (ref 0.0–0.2)

## 2020-04-02 LAB — SAVE SMEAR(SSMR), FOR PROVIDER SLIDE REVIEW

## 2020-04-02 LAB — HEPATIC FUNCTION PANEL
ALT: 27 U/L (ref 0–44)
AST: 18 U/L (ref 15–41)
Albumin: 2.3 g/dL — ABNORMAL LOW (ref 3.5–5.0)
Alkaline Phosphatase: 218 U/L — ABNORMAL HIGH (ref 38–126)
Bilirubin, Direct: 0.1 mg/dL (ref 0.0–0.2)
Indirect Bilirubin: 0.5 mg/dL (ref 0.3–0.9)
Total Bilirubin: 0.6 mg/dL (ref 0.3–1.2)
Total Protein: 9.9 g/dL — ABNORMAL HIGH (ref 6.5–8.1)

## 2020-04-02 LAB — RAPID URINE DRUG SCREEN, HOSP PERFORMED
Amphetamines: NOT DETECTED
Barbiturates: NOT DETECTED
Benzodiazepines: NOT DETECTED
Cocaine: NOT DETECTED
Opiates: POSITIVE — AB
Tetrahydrocannabinol: NOT DETECTED

## 2020-04-02 LAB — RESP PANEL BY RT-PCR (FLU A&B, COVID) ARPGX2
Influenza A by PCR: NEGATIVE
Influenza B by PCR: NEGATIVE
SARS Coronavirus 2 by RT PCR: NEGATIVE

## 2020-04-02 LAB — TROPONIN I (HIGH SENSITIVITY)
Troponin I (High Sensitivity): 51 ng/L — ABNORMAL HIGH (ref <18)
Troponin I (High Sensitivity): 68 ng/L — ABNORMAL HIGH (ref <18)

## 2020-04-02 LAB — TSH: TSH: 2.004 u[IU]/mL (ref 0.350–4.500)

## 2020-04-02 LAB — SEDIMENTATION RATE: Sed Rate: 132 mm/hr — ABNORMAL HIGH (ref 0–22)

## 2020-04-02 LAB — VITAMIN D 25 HYDROXY (VIT D DEFICIENCY, FRACTURES): Vit D, 25-Hydroxy: 19.61 ng/mL — ABNORMAL LOW (ref 30–100)

## 2020-04-02 LAB — SODIUM, URINE, RANDOM: Sodium, Ur: <10 mmol/L

## 2020-04-02 LAB — LACTATE DEHYDROGENASE: LDH: 128 U/L (ref 98–192)

## 2020-04-02 LAB — CREATININE, URINE, RANDOM: Creatinine, Urine: 55.67 mg/dL

## 2020-04-02 MED ORDER — ONDANSETRON HCL 4 MG PO TABS
4.0000 mg | ORAL_TABLET | Freq: Four times a day (QID) | ORAL | Status: DC | PRN
  Administered 2020-04-03: 4 mg via ORAL
  Filled 2020-04-02: qty 1

## 2020-04-02 MED ORDER — CALCITONIN (SALMON) 200 UNIT/ML IJ SOLN
400.0000 [IU] | Freq: Two times a day (BID) | INTRAMUSCULAR | Status: AC
  Administered 2020-04-02 – 2020-04-04 (×4): 400 [IU] via SUBCUTANEOUS
  Filled 2020-04-02 (×5): qty 2

## 2020-04-02 MED ORDER — LACTATED RINGERS IV BOLUS
2000.0000 mL | Freq: Once | INTRAVENOUS | Status: AC
  Administered 2020-04-02: 2000 mL via INTRAVENOUS

## 2020-04-02 MED ORDER — LABETALOL HCL 5 MG/ML IV SOLN
10.0000 mg | INTRAVENOUS | Status: DC | PRN
  Administered 2020-04-05 – 2020-04-06 (×2): 10 mg via INTRAVENOUS
  Filled 2020-04-02 (×5): qty 4

## 2020-04-02 MED ORDER — LORAZEPAM 0.5 MG PO TABS
0.5000 mg | ORAL_TABLET | Freq: Three times a day (TID) | ORAL | Status: DC | PRN
  Administered 2020-04-02: 0.5 mg via ORAL
  Administered 2020-04-03 – 2020-04-08 (×5): 1 mg via ORAL
  Filled 2020-04-02: qty 2
  Filled 2020-04-02: qty 1
  Filled 2020-04-02 (×4): qty 2

## 2020-04-02 MED ORDER — FENTANYL CITRATE (PF) 100 MCG/2ML IJ SOLN
25.0000 ug | INTRAMUSCULAR | Status: AC | PRN
  Administered 2020-04-02 – 2020-04-05 (×6): 25 ug via INTRAVENOUS
  Filled 2020-04-02 (×7): qty 2

## 2020-04-02 MED ORDER — DEXAMETHASONE SODIUM PHOSPHATE 10 MG/ML IJ SOLN
20.0000 mg | Freq: Every day | INTRAMUSCULAR | Status: AC
  Administered 2020-04-03 – 2020-04-05 (×3): 20 mg via INTRAVENOUS
  Filled 2020-04-02 (×3): qty 2
  Filled 2020-04-02 (×2): qty 5
  Filled 2020-04-02: qty 2

## 2020-04-02 MED ORDER — MORPHINE SULFATE (PF) 4 MG/ML IV SOLN
4.0000 mg | INTRAVENOUS | Status: DC | PRN
  Administered 2020-04-02: 4 mg via INTRAVENOUS
  Filled 2020-04-02: qty 1

## 2020-04-02 MED ORDER — FENTANYL CITRATE (PF) 100 MCG/2ML IJ SOLN: 50.0000 ug | INTRAMUSCULAR | Status: DC | PRN

## 2020-04-02 MED ORDER — METOPROLOL TARTRATE 5 MG/5ML IV SOLN
2.5000 mg | INTRAVENOUS | Status: AC
  Administered 2020-04-02: 2.5 mg via INTRAVENOUS
  Filled 2020-04-02: qty 5

## 2020-04-02 MED ORDER — DEXAMETHASONE SODIUM PHOSPHATE 10 MG/ML IJ SOLN
20.0000 mg | Freq: Once | INTRAMUSCULAR | Status: AC
  Administered 2020-04-02: 20 mg via INTRAVENOUS
  Filled 2020-04-02 (×2): qty 5
  Filled 2020-04-02 (×2): qty 2

## 2020-04-02 MED ORDER — ONDANSETRON HCL 4 MG/2ML IJ SOLN
4.0000 mg | Freq: Four times a day (QID) | INTRAMUSCULAR | Status: DC | PRN
  Administered 2020-04-02 – 2020-04-05 (×3): 4 mg via INTRAVENOUS
  Filled 2020-04-02 (×3): qty 2

## 2020-04-02 MED ORDER — FENTANYL CITRATE (PF) 100 MCG/2ML IJ SOLN
100.0000 ug | Freq: Once | INTRAMUSCULAR | Status: AC
Start: 2020-04-02 — End: 2020-04-02
  Administered 2020-04-02: 100 ug via INTRAVENOUS
  Filled 2020-04-02: qty 2

## 2020-04-02 MED ORDER — NICOTINE 21 MG/24HR TD PT24
21.0000 mg | MEDICATED_PATCH | Freq: Every day | TRANSDERMAL | Status: DC
  Administered 2020-04-02 – 2020-04-09 (×8): 21 mg via TRANSDERMAL
  Filled 2020-04-02 (×8): qty 1

## 2020-04-02 MED ORDER — SODIUM CHLORIDE 0.9% FLUSH
3.0000 mL | Freq: Two times a day (BID) | INTRAVENOUS | Status: DC
  Administered 2020-04-02 – 2020-04-09 (×11): 3 mL via INTRAVENOUS

## 2020-04-02 MED ORDER — ONDANSETRON HCL 4 MG/2ML IJ SOLN
4.0000 mg | Freq: Once | INTRAMUSCULAR | Status: AC
  Administered 2020-04-02: 4 mg via INTRAVENOUS
  Filled 2020-04-02: qty 2

## 2020-04-02 MED ORDER — HEPARIN SODIUM (PORCINE) 5000 UNIT/ML IJ SOLN
5000.0000 [IU] | Freq: Three times a day (TID) | INTRAMUSCULAR | Status: DC
  Administered 2020-04-02 – 2020-04-09 (×20): 5000 [IU] via SUBCUTANEOUS
  Filled 2020-04-02 (×20): qty 1

## 2020-04-02 MED ORDER — SODIUM CHLORIDE 0.9 % IV SOLN
60.0000 mg | Freq: Once | INTRAVENOUS | Status: AC
  Administered 2020-04-03: 60 mg via INTRAVENOUS
  Filled 2020-04-02: qty 20

## 2020-04-02 MED ORDER — ALBUTEROL SULFATE (2.5 MG/3ML) 0.083% IN NEBU
2.5000 mg | INHALATION_SOLUTION | RESPIRATORY_TRACT | Status: DC | PRN
  Administered 2020-04-04: 2.5 mg via RESPIRATORY_TRACT
  Filled 2020-04-02: qty 3

## 2020-04-02 MED ORDER — SODIUM CHLORIDE 0.9 % IV SOLN: INTRAVENOUS | Status: DC

## 2020-04-02 MED ORDER — OXYCODONE-ACETAMINOPHEN 5-325 MG PO TABS
1.0000 | ORAL_TABLET | ORAL | Status: DC | PRN
Start: 2020-04-02 — End: 2020-04-03
  Administered 2020-04-02 (×2): 1 via ORAL
  Filled 2020-04-02 (×2): qty 1

## 2020-04-02 MED ORDER — METOPROLOL TARTRATE 5 MG/5ML IV SOLN
2.5000 mg | INTRAVENOUS | Status: DC | PRN
  Administered 2020-04-02: 2.5 mg via INTRAVENOUS
  Filled 2020-04-02: qty 5

## 2020-04-02 NOTE — ED Notes (Signed)
Date and time results received: 04/02/20 1005   Test: Ca+ Critical Value: 14.5  Name of Provider Notified: Dr Kathrynn Humble

## 2020-04-02 NOTE — ED Notes (Signed)
Pt returned from ct scan. Pt repositioned in stretcher and puriwick applied

## 2020-04-02 NOTE — ED Notes (Signed)
Date and time results received: 04/02/20 1122   Test: Ca+ Critical Value: 14.5  Name of Provider Notified: Dr Kathrynn Humble

## 2020-04-02 NOTE — Consult Note (Addendum)
[place]  [phone]) [phone] Fax:(336) [phone]   MEDICAL ONCOLOGY - INITIAL CONSULTATION  Referral MD: Dr. Fuller Plan  Reason for Referral: Hypercalcemia, lytic lesions  HPI: Ms. Stacey Montoya is a 45 year old female with no significant past medical history.  She presented to the emergency room with back pain.  The patient has been seeing a chiropractor for her back pain.  She received realignment of her back on 1/5 and on 1/10 she reported severe back pain.  She was seen at [facility] on 1/20 and was diagnosed with a T12 compression fracture.  She received pain medication along with muscle relaxers and nausea medication.  Pain is worsened and has become debilitating.  Pain is located in the thoracolumbar spine and radiates down her left leg.  She also reports weakness of her bilateral legs (left greater than right) and diminished sensation in the left leg.  She is also had fevers nightly for the past 4 weeks.  Work-up shows a WBC of 14.5 and hemoglobin of 9.7.  Sodium 125, BUN 53, creatinine 3.37, calcium 14.5.  CT of the chest/abdomen/pelvis without contrast showed widespread lytic lesion involving the visualized skeleton consistent with metastatic disease or multiple myeloma.  No definite primary malignancy identified in the chest, abdomen, or pelvis.  There was also nonspecific pulmonary groundglass opacities in the left greater than right upper lobes which is likely infectious or inflammatory.  Bone survey has been obtained and results are currently pending.  The patient was seen in the emergency room.  Her husband was at the bedside.  The patient reports worsening back pain dating back to early January 2022.  She has more recently developed left hip pain with radiation down her left leg over the past 1.5 to 2 weeks.  She has been having some difficulty walking and has required use of a walker.  She reports her appetite has been poor and is uncertain if she has lost any  weight because she cannot stand on the scale.  She has been having fevers nightly typically in the 99 to low 100 range.  Fever has been as high as 101.  She is not having any headaches or dizziness.  Denies chest pain, shortness of breath.  Denies abdominal pain, nausea, vomiting.  She is having constipation.  No bleeding reported.  The patient is married and has 1 child.  Drinks alcohol socially.  She previously smoked cigarettes but now vapes.  Family history significant for paternal grandfather with pancreatic cancer and a maternal grandfather with lung cancer.  Medical oncology was asked see the patient for recommendations regarding her hypercalcemia and lytic lesions.   Past Medical History:  Diagnosis Date  . Back pain   :  History reviewed. No pertinent surgical history.:  Current Facility-Administered Medications  Medication Dose Route Frequency Provider Last Rate Last Admin  . 0.9 %  sodium chloride infusion   Intravenous Continuous Smith, Rondell A, MD      . albuterol (PROVENTIL) (2.5 MG/3ML) 0.083% nebulizer solution 2.5 mg  2.5 mg Nebulization Q4H PRN Smith, Rondell A, MD      . fentaNYL (SUBLIMAZE) injection 50 mcg  50 mcg Intravenous Q2H PRN Smith, Rondell A, MD      . heparin injection 5,000 Units  5,000 Units Subcutaneous Q8H Smith, Rondell A, MD      . metoprolol tartrate (LOPRESSOR) injection 2.5 mg  2.5 mg Intravenous Q2H PRN Smith, Rondell A, MD      . metoprolol tartrate (LOPRESSOR) injection  2.5 mg  2.5 mg Intravenous STAT Smith, Rondell A, MD      . morphine 4 MG/ML injection 4 mg  4 mg Intravenous Q4H PRN Fuller Plan A, MD   4 mg at 04/02/20 1149  . ondansetron (ZOFRAN) tablet 4 mg  4 mg Oral Q6H PRN Fuller Plan A, MD       Or  . ondansetron (ZOFRAN) injection 4 mg  4 mg Intravenous Q6H PRN Smith, Rondell A, MD      . oxyCODONE-acetaminophen (PERCOCET/ROXICET) 5-325 MG per tablet 1 tablet  1 tablet Oral Q4H PRN Smith, Rondell A, MD      . sodium chloride flush  (NS) 0.9 % injection 3 mL  3 mL Intravenous Q12H Norval Morton, MD       Current Outpatient Medications  Medication Sig Dispense Refill  . albuterol (VENTOLIN HFA) 108 (90 Base) MCG/ACT inhaler Inhale 1-2 puffs into the lungs as needed for wheezing.    . cyclobenzaprine (FLEXERIL) 10 MG tablet Take 10 mg by mouth 3 (three) times daily.    . norethindrone (MICRONOR) 0.35 MG tablet Take 1 tablet by mouth daily.    . ondansetron (ZOFRAN) 4 MG tablet Take 4 mg by mouth every 8 (eight) hours.    Marland Kitchen oxyCODONE-acetaminophen (PERCOCET/ROXICET) 5-325 MG tablet Take 1 tablet by mouth every 4 (four) hours as needed for pain.       Allergies  Allergen Reactions  . Azithromycin Rash  :  History reviewed. No pertinent family history.:  Social History   Socioeconomic History  . Marital status: Married    Spouse name: Not on file  . Number of children: Not on file  . Years of education: Not on file  . Highest education level: Not on file  Occupational History  . Not on file  Tobacco Use  . Smoking status: Never Smoker  . Smokeless tobacco: Never Used  Substance and Sexual Activity  . Alcohol use: Not Currently  . Drug use: Not Currently  . Sexual activity: Not on file  Other Topics Concern  . Not on file  Social History Narrative  . Not on file   Social Determinants of Health   Financial Resource Strain: Not on file  Food Insecurity: Not on file  Transportation Needs: Not on file  Physical Activity: Not on file  Stress: Not on file  Social Connections: Not on file  Intimate Partner Violence: Not on file  :  Review of Systems: A comprehensive 14 point review of systems was negative except as noted in the HPI.  Exam: Patient Vitals for the past 24 hrs:  BP Temp Pulse Resp SpO2  04/02/20 1315 (!) 191/113 -- (!) 130 17 93 %  04/02/20 1230 (!) 193/100 -- (!) 123 (!) 21 98 %  04/02/20 1130 (!) 187/108 -- (!) 125 15 94 %  04/02/20 1115 (!) 184/113 -- (!) 126 18 96 %  04/02/20  1045 (!) 170/102 -- (!) 123 16 90 %  04/02/20 1030 (!) 171/101 -- (!) 126 18 91 %  04/02/20 1000 (!) 175/110 -- (!) 130 (!) 22 96 %  04/02/20 0945 (!) 165/111 -- (!) 137 17 96 %  04/02/20 0906 -- -- (!) 136 19 99 %  04/02/20 0828 (!) 175/95 99.5 F (37.5 C) (!) 143 16 99 %    General:  well-nourished in no acute distress.   Eyes:  no scleral icterus.   ENT:  There were no oropharyngeal lesions.   Lymphatics:  Negative cervical, supraclavicular or axillary adenopathy.   Respiratory: lungs were clear bilaterally without wheezing or crackles.   Cardiovascular:  Regular rate and rhythm, S1/S2, without murmur, rub or gallop.  There was trace pedal edema.   GI:  abdomen was soft, flat, nontender, nondistended, without organomegaly.   Musculoskeletal: The patient is able to lift both legs off of the stretcher but has significant pain when lifting the left leg. Skin exam was without echymosis, petichae.   Neuro exam was nonfocal. Patient was alert and oriented.  Diminished sensation to the left lower extremity.   Lab Results  Component Value Date   WBC 14.5 (H) 04/02/2020   HGB 9.7 (L) 04/02/2020   HCT 29.5 (L) 04/02/2020   PLT 387 04/02/2020   GLUCOSE 105 (H) 04/02/2020   NA 124 (L) 04/02/2020   K 3.3 (L) 04/02/2020   CL 92 (L) 04/02/2020   CREATININE 3.38 (H) 04/02/2020   BUN 53 (H) 04/02/2020   CO2 24 04/02/2020    DG Chest 2 View  Result Date: 04/02/2020 CLINICAL DATA:  45 year old female with tachycardia and low grade fever. EXAM: CHEST - 2 VIEW COMPARISON:  03/10/2018 FINDINGS: The heart size and mediastinal contours are within normal limits. Both lungs are clear. The visualized skeletal structures are unremarkable. IMPRESSION: No acute cardiopulmonary process. Electronically Signed   By: Ruthann Cancer MD   On: 04/02/2020 09:09   CT L-SPINE NO CHARGE  Result Date: 04/02/2020 CLINICAL DATA:  Back pain.  Pathologic fracture with hypercalcemia. EXAM: CT LUMBAR SPINE WITHOUT  CONTRAST TECHNIQUE: Multidetector CT imaging of the lumbar spine was performed without intravenous contrast administration. Multiplanar CT image reconstructions were also generated. COMPARISON:  Thoracolumbar spine radiographs 03/07/2020 FINDINGS: Segmentation: 5 lumbar type vertebrae. Alignment: Straightening of the normal lumbar lordosis. No significant listhesis. Vertebrae: Widespread lytic bone lesions throughout the lumbar spine as well as included lower thoracic spine and pelvis. Pathologic T12 compression fracture with 50% vertebral body height loss, progressed from the prior radiographs. New pathologic L4 fracture involving the vertebral body and pedicles with 85% vertebral body height loss centrally and 6 mm retropulsion of the posterior vertebral cortex. Large lesion involving the left pedicle, pars, and transverse process of L1 with possible nondisplaced pathologic fracture. Paraspinal and other soft tissues: No acute paraspinal soft tissue abnormality. Intra-abdominal and pelvic contents reported separately. 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 IMPRESSION: 1. Widespread lytic bone lesions consistent with metastatic disease. 2. Pathologic T12 and L4 vertebral fractures as above. Possible nondisplaced pathologic fracture of the left L1 posterior elements. 3. 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 Electronically Signed   By: Logan Bores M.D.   On: 04/02/2020 12:52   CT CHEST ABDOMEN PELVIS WO CONTRAST  Result Date: 04/02/2020 CLINICAL DATA:  Thoracic and lumbar spine pain. Pathologic fracture with hypercalcemia. EXAM: CT CHEST, ABDOMEN AND PELVIS WITHOUT CONTRAST TECHNIQUE: Multidetector CT imaging of the chest, abdomen and pelvis was performed following the standard protocol without IV contrast. COMPARISON:  Chest radiographs 04/02/2020. Thoracolumbar spine radiographs 03/07/2020. FINDINGS: CT CHEST FINDINGS Cardiovascular:  Normal caliber of the thoracic aorta. Normal heart size. No pericardial effusion. Mediastinum/Nodes: No enlarged axillary, mediastinal, or hilar lymph nodes are identified with hilar assessment limited in the absence of IV contrast. Unremarkable visualized thyroid and esophagus. Lungs/Pleura: No pleural effusion or pneumothorax. Patchy and hazy ground-glass opacities in the left greater than right upper lobes. No parenchymal lung mass. Musculoskeletal: Widespread  lytic bone lesions throughout the visualized skeleton including a destructive 5 cm lesion involving the posterior right third rib and a 3 cm destructive lesion involving the posteromedial right sixth rib. Smaller destructive lesions with nondisplaced pathologic fractures of the posterolateral right fourth and posterior left fourth ribs. Large lesion involving much of the T1 vertebral body with disruption of the posterior vertebral body cortex but no gross epidural tumor. Pathologic vertebral body fractures with mild vertebral body height loss at T7 and T8 and moderate height loss at T12. CT ABDOMEN PELVIS FINDINGS Hepatobiliary: No focal liver abnormality is seen. No gallstones, gallbladder wall thickening, or biliary dilatation. Pancreas: Unremarkable. Spleen: Unremarkable. Adrenals/Urinary Tract: Unremarkable adrenal glands. No evidence of renal mass, calculi, or hydronephrosis. Unremarkable bladder. Stomach/Bowel: The stomach is moderately distended by fluid and is otherwise unremarkable. There is no evidence of bowel obstruction or inflammation. The appendix is unremarkable. Vascular/Lymphatic: Normal caliber of the abdominal aorta. No enlarged lymph nodes. Reproductive: Grossly unremarkable uterus and ovaries. Other: No ascites. Musculoskeletal: Widespread lytic bone lesions throughout the spine and pelvis with the lumbar spine evaluated in detail on the separate dedicated spine CT. IMPRESSION: 1. Widespread lytic lesions throughout the visualized  skeleton consistent with metastatic disease or multiple myeloma. No definite primary malignancy identified in the chest, abdomen, or pelvis. 2. Nonspecific pulmonary ground-glass opacities in the left greater than right upper lobes, likely infectious or inflammatory. Electronically Signed   By: Logan Bores M.D.   On: 04/02/2020 13:12     DG Chest 2 View  Result Date: 04/02/2020 CLINICAL DATA:  45 year old female with tachycardia and low grade fever. EXAM: CHEST - 2 VIEW COMPARISON:  03/10/2018 FINDINGS: The heart size and mediastinal contours are within normal limits. Both lungs are clear. The visualized skeletal structures are unremarkable. IMPRESSION: No acute cardiopulmonary process. Electronically Signed   By: Ruthann Cancer MD   On: 04/02/2020 09:09   CT L-SPINE NO CHARGE  Result Date: 04/02/2020 CLINICAL DATA:  Back pain.  Pathologic fracture with hypercalcemia. EXAM: CT LUMBAR SPINE WITHOUT CONTRAST TECHNIQUE: Multidetector CT imaging of the lumbar spine was performed without intravenous contrast administration. Multiplanar CT image reconstructions were also generated. COMPARISON:  Thoracolumbar spine radiographs 03/07/2020 FINDINGS: Segmentation: 5 lumbar type vertebrae. Alignment: Straightening of the normal lumbar lordosis. No significant listhesis. Vertebrae: Widespread lytic bone lesions throughout the lumbar spine as well as included lower thoracic spine and pelvis. Pathologic T12 compression fracture with 50% vertebral body height loss, progressed from the prior radiographs. New pathologic L4 fracture involving the vertebral body and pedicles with 85% vertebral body height loss centrally and 6 mm retropulsion of the posterior vertebral cortex. Large lesion involving the left pedicle, pars, and transverse process of L1 with possible nondisplaced pathologic fracture. Paraspinal and other soft tissues: No acute paraspinal soft tissue abnormality. Intra-abdominal and pelvic contents reported  separately. 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 IMPRESSION: 1. Widespread lytic bone lesions consistent with metastatic disease. 2. Pathologic T12 and L4 vertebral fractures as above. Possible nondisplaced pathologic fracture of the left L1 posterior elements. 3. 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 Electronically Signed   By: Logan Bores M.D.   On: 04/02/2020 12:52   CT CHEST ABDOMEN PELVIS WO CONTRAST  Result Date: 04/02/2020 CLINICAL DATA:  Thoracic and lumbar spine pain. Pathologic fracture with hypercalcemia. EXAM: CT CHEST, ABDOMEN AND PELVIS WITHOUT CONTRAST TECHNIQUE: Multidetector CT imaging of the chest, abdomen and pelvis was  performed following the standard protocol without IV contrast. COMPARISON:  Chest radiographs 04/02/2020. Thoracolumbar spine radiographs 03/07/2020. FINDINGS: CT CHEST FINDINGS Cardiovascular: Normal caliber of the thoracic aorta. Normal heart size. No pericardial effusion. Mediastinum/Nodes: No enlarged axillary, mediastinal, or hilar lymph nodes are identified with hilar assessment limited in the absence of IV contrast. Unremarkable visualized thyroid and esophagus. Lungs/Pleura: No pleural effusion or pneumothorax. Patchy and hazy ground-glass opacities in the left greater than right upper lobes. No parenchymal lung mass. Musculoskeletal: Widespread lytic bone lesions throughout the visualized skeleton including a destructive 5 cm lesion involving the posterior right third rib and a 3 cm destructive lesion involving the posteromedial right sixth rib. Smaller destructive lesions with nondisplaced pathologic fractures of the posterolateral right fourth and posterior left fourth ribs. Large lesion involving much of the T1 vertebral body with disruption of the posterior vertebral body cortex but no gross epidural tumor. Pathologic vertebral body fractures with mild vertebral body height loss at T7 and  T8 and moderate height loss at T12. CT ABDOMEN PELVIS FINDINGS Hepatobiliary: No focal liver abnormality is seen. No gallstones, gallbladder wall thickening, or biliary dilatation. Pancreas: Unremarkable. Spleen: Unremarkable. Adrenals/Urinary Tract: Unremarkable adrenal glands. No evidence of renal mass, calculi, or hydronephrosis. Unremarkable bladder. Stomach/Bowel: The stomach is moderately distended by fluid and is otherwise unremarkable. There is no evidence of bowel obstruction or inflammation. The appendix is unremarkable. Vascular/Lymphatic: Normal caliber of the abdominal aorta. No enlarged lymph nodes. Reproductive: Grossly unremarkable uterus and ovaries. Other: No ascites. Musculoskeletal: Widespread lytic bone lesions throughout the spine and pelvis with the lumbar spine evaluated in detail on the separate dedicated spine CT. IMPRESSION: 1. Widespread lytic lesions throughout the visualized skeleton consistent with metastatic disease or multiple myeloma. No definite primary malignancy identified in the chest, abdomen, or pelvis. 2. Nonspecific pulmonary ground-glass opacities in the left greater than right upper lobes, likely infectious or inflammatory. Electronically Signed   By: Logan Bores M.D.   On: 04/02/2020 13:12   Assessment and Plan:  This is a 45 year old female with  1) hypercalcemia  2) lytic bone lesions  3) acute renal failure  4) normocytic anemia  5) back pain secondary to pathologic fracture  PLAN: -I discussed the findings to date with the patient and her husband.  Given the hypercalcemia, acute renal failure, and anemia taken together with lytic bone lesions on her CT scan, findings concerning for multiple myeloma. -Bone survey results will be reviewed once available. -We discussed that additional work-up is needed to confirm the diagnosis.  Additional lab work has been ordered including SPEP, UPEP, kappa lambda light chains.  Will await this work-up.  I also  briefly discussed with the patient that she will likely need a bone marrow biopsy.  I reviewed this procedure with the patient.  Final recommendation per Dr. Irene Limbo.  -Recommend aggressive hydration for treatment of hypercalcemia.  Hold on bisphosphonate for now due to acute renal failure.  This can be considered when renal function improves.  Agree with calcitonin for now. -Recommend opioid pain medication for her back pain. -Recommend evaluation by neurosurgery for recommendations regarding her compression fracture.  Thank you for this referral.   Mikey Bussing, DNP, AGPCNP-BC, AOCNP    ADDENDUM  .Patient was Personally and independently interviewed, examined and relevant elements of the history of present illness were reviewed in details and an assessment and plan was created. All elements of the patient's history of present illness , assessment and plan were discussed in details with  Mikey Bussing, DNP, AGPCNP-BC, AOCNP  The above documentation reflects our combined findings assessment and plan.  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 Hypercalcemia for malignancy -Anemia likely related to myeloma AKI likely related to myeloma, hypercalcemia -multiple bony lytic lesion likely from myeloma PLAN -labs for myeloma w/u including UPEP PLAN -IVF, calcitonin -IV Pamidronate x 1 for malignancy related hypercalcemia -CT bone marrow aspiration and biopsy Dexamethasone 29m daily x 4 days -orthopedics consult for evaluation of need for prophylactic IM nailing of femur -neurosurgery consultation for evaluation of spinal bone lesions -radiation oncology for address painful large bone mets -will likely rx with CyBorD once diagnosis confirmed.   GSullivan LoneMD MS

## 2020-04-02 NOTE — ED Notes (Signed)
Patient transported to CT 

## 2020-04-02 NOTE — ED Notes (Addendum)
2C denied report stating "charge didn't know about this." Bed ready for 1 hour.

## 2020-04-02 NOTE — ED Triage Notes (Signed)
Reports pain to thoracic back that radiates to lower back and L hip.  Initially pain started 02/26/20 and she was going to a chiropractor to have adjustments.  States pain worsened and she went to Dr and diagnosed with T-12 compound fracture 3 weeks ago.  Unable to walk without assistance and reports weakness to bilateral lower extremities.  HR 140-150 at triage.  States she feels like heart is pounding.  Lying in recliner for 5 weeks and was told to come to ED when pain medication no longer working.  Taking Percocet for pain.  States she is not eating and not sleeping.  Denies SOB.

## 2020-04-02 NOTE — H&P (Addendum)
History and Physical    Stacey Montoya UYQ:[id number] DOB: Jul 12, 1975 DOA: 04/02/2020  Referring MD/NP/PA: Varney Biles, MD PCP: Stacey Squibb, MD  Patient coming from: Home  Chief Complaint: Back pain  I have personally briefly reviewed patient's old medical records in [place]   HPI: Stacey Montoya is a 45 y.o. female with past medical history significant for tobacco abuse who presents with complaints of severe back pain.  History is obtained from the patient with the help of her husband present at bedside. Symptoms initially started last year before Christmas in the lower part of her back.  Denies ever having any trauma or falls to onset pain.  Due to the pain in her back she was seen by a chiropractor on 1/5 who realigned her back.  She reported having a coughing spells which may have worsening in pain.   She had been seen by her primary care provider who had prescribed steroid Dosepak, Mobic, and Robaxin which have provided no relief.  Patient was referred to [facility] on 1/20 and was diagnosed with a T12 compression fracture.  At that time she was prescribed Percocet and advised to rest.  Since that time she reports that the pain in her back had spread to her left hip and would was intermittently having sharp pains radiating down her left leg.  Patient reports associated symptoms of decreased appetite, indigestion/upset stomach for which she been taking Tums up to 3 times per day, and some intermittent grogginess.  She is currently coming off her menstrual cycle which is usually heavy.  Denies any significant chest pain, shortness of breath, vomiting, or diarrhea symptoms.  She had been keeping her self hydrated.  At home pain had gotten to the point where she was unable to get up and move around like usual despite taking medications including the Percocet.  She previously smoked half a pack of cigarettes per day on average from the age of 64 to around 45  years old.  Thereafter she picked up vaping which she continues to do daily.  Patient had found a lump on her breast and had mammogram and ultrasound in March 2021 which showed that the area was benign.  Family history is significant for a paternal grandfather with pancreatic cancer and maternal grandfather with history of lung cancer.  Patient thinks her last checkup with lab work was sometime in September of last year and at that time she was not notified about abnormal kidney function  ED Course: Upon admission into the emergency department patient was seen to be afebrile 30-1 43, respirations 15-22, blood pressure is elevated up to 193/113,, and O2 saturations currently maintained on room air.  Labs from 2/15 were significant for WBC 14.5, hemoglobin 9.7, sodium 125, potassium 3.9, BUN 53, creatinine 3.37, glucose 101, calcium 14.5->14.5, and high-sensitivity troponin 51->68.  Chest x-ray showed no acute abnormality.  CT scan of the chest abdomen and pelvis had been obtained and showed widespread lytic lesions consistent with metastatic disease versus multiple myeloma.  Patient had been 2 L of lactated Ringer's, pain medication, and antiemetics.  TRH called to admit.  Review of Systems  Constitutional: Positive for malaise/fatigue. Negative for fever.  HENT: Negative for nosebleeds and sinus pain.   Eyes: Negative for photophobia and pain.  Respiratory: Negative for cough and shortness of breath.   Cardiovascular: Negative for chest pain and leg swelling.  Gastrointestinal: Positive for heartburn. Negative for diarrhea, melena and vomiting.  Genitourinary:  Positive for hematuria. Negative for dysuria.  Musculoskeletal: Positive for back pain and myalgias.  Skin: Negative for rash.  Neurological: Positive for weakness. Negative for loss of consciousness.  Psychiatric/Behavioral: Positive for substance abuse. Negative for memory loss.    Past Medical History:  Diagnosis Date  . Back pain      History reviewed. No pertinent surgical history.   reports that she has never smoked. She has never used smokeless tobacco. She reports previous alcohol use. She reports previous drug use.  Allergies  Allergen Reactions  . Azithromycin Rash    History reviewed. No pertinent family history.  Prior to Admission medications   Medication Sig Start Date End Date Taking? Authorizing Provider  albuterol (VENTOLIN HFA) 108 (90 Base) MCG/ACT inhaler Inhale 1-2 puffs into the lungs as needed for wheezing. 02/01/20  Yes [provider]  cyclobenzaprine (FLEXERIL) 10 MG tablet Take 10 mg by mouth 3 (three) times daily. 03/08/20  Yes [provider]  norethindrone (MICRONOR) 0.35 MG tablet Take 1 tablet by mouth daily. 03/15/20  Yes [provider]  ondansetron (ZOFRAN) 4 MG tablet Take 4 mg by mouth every 8 (eight) hours. 03/19/20  Yes [provider]  oxyCODONE-acetaminophen (PERCOCET/ROXICET) 5-325 MG tablet Take 1 tablet by mouth every 4 (four) hours as needed for pain. 03/19/20  Yes [provider]    Physical Exam:  Constitutional: Middle-aged female who appears to be in some distress Vitals:   04/02/20 1045 04/02/20 1115 04/02/20 1130 04/02/20 1230  BP: (!) 170/102 (!) 184/113 (!) 187/108 (!) 193/100  Pulse: (!) 123 (!) 126 (!) 125 (!) 123  Resp: 16 18 15  (!) 21  Temp:      SpO2: 90% 96% 94% 98%   Eyes: PERRL, lids and conjunctivae normal ENMT: Mucous membranes are dry. Posterior pharynx clear of any exudate or lesions.  Neck: normal, supple, no masses, no thyromegaly Respiratory: clear to auscultation bilaterally, no wheezing, no crackles. Normal respiratory effort. No accessory muscle use.  Cardiovascular: Tachycardic, no murmurs / rubs / gallops. No extremity edema. 2+ pedal pulses. No carotid bruits.  Abdomen: no tenderness, no masses palpated. No hepatosplenomegaly. Bowel sounds positive.  Musculoskeletal: no clubbing / cyanosis.   Tenderness to palpation of multiple areas including legs and back. Skin: no rashes, lesions, ulcers. No induration Neurologic: CN 2-12 grossly intact. Sensation intact, DTR normal. Strength 5/5 in all 4.  Psychiatric: Normal judgment and insight. Alert and oriented x 3. Normal mood.     Labs on Admission: I have personally reviewed following labs and imaging studies  CBC: Recent Labs  Lab 04/02/20 0843  WBC 14.5*  HGB 9.7*  HCT 29.5*  MCV 96.4  PLT 341   Basic Metabolic Panel: Recent Labs  Lab 04/02/20 0843 04/02/20 1015  NA 125* 124*  K 3.9 3.3*  CL 94* 92*  CO2 23 24  GLUCOSE 101* 105*  BUN 53* 53*  CREATININE 3.37* 3.38*  CALCIUM 14.5* 14.5*   GFR: CrCl cannot be calculated (Unknown ideal weight.). Liver Function Tests: No results for input(s): AST, ALT, ALKPHOS, BILITOT, PROT, ALBUMIN in the last 168 hours. No results for input(s): LIPASE, AMYLASE in the last 168 hours. No results for input(s): AMMONIA in the last 168 hours. Coagulation Profile: No results for input(s): INR, PROTIME in the last 168 hours. Cardiac Enzymes: No results for input(s): CKTOTAL, CKMB, CKMBINDEX, TROPONINI in the last 168 hours. BNP (last 3 results) No results for input(s): PROBNP in the last 8760 hours. HbA1C:  No results for input(s): HGBA1C in the last 72 hours. CBG: No results for input(s): GLUCAP in the last 168 hours. Lipid Profile: No results for input(s): CHOL, HDL, LDLCALC, TRIG, CHOLHDL, LDLDIRECT in the last 72 hours. Thyroid Function Tests: Recent Labs    04/02/20 1112  TSH 2.004   Anemia Panel: No results for input(s): VITAMINB12, FOLATE, FERRITIN, TIBC, IRON, RETICCTPCT in the last 72 hours. Urine analysis: No results found for: COLORURINE, APPEARANCEUR, LABSPEC, PHURINE, GLUCOSEU, HGBUR, BILIRUBINUR, KETONESUR, PROTEINUR, UROBILINOGEN, NITRITE, LEUKOCYTESUR Sepsis Labs: No results found for this or any previous visit (from the past 240 hour(s)).   Radiological  Exams on Admission: DG Chest 2 View  Result Date: 04/02/2020 CLINICAL DATA:  45 year old female with tachycardia and low grade fever. EXAM: CHEST - 2 VIEW COMPARISON:  03/10/2018 FINDINGS: The heart size and mediastinal contours are within normal limits. Both lungs are clear. The visualized skeletal structures are unremarkable. IMPRESSION: No acute cardiopulmonary process. Electronically Signed   By: Ruthann Cancer MD   On: 04/02/2020 09:09   CT L-SPINE NO CHARGE  Result Date: 04/02/2020 CLINICAL DATA:  Back pain.  Pathologic fracture with hypercalcemia. EXAM: CT LUMBAR SPINE WITHOUT CONTRAST TECHNIQUE: Multidetector CT imaging of the lumbar spine was performed without intravenous contrast administration. Multiplanar CT image reconstructions were also generated. COMPARISON:  Thoracolumbar spine radiographs 03/07/2020 FINDINGS: Segmentation: 5 lumbar type vertebrae. Alignment: Straightening of the normal lumbar lordosis. No significant listhesis. Vertebrae: Widespread lytic bone lesions throughout the lumbar spine as well as included lower thoracic spine and pelvis. Pathologic T12 compression fracture with 50% vertebral body height loss, progressed from the prior radiographs. New pathologic L4 fracture involving the vertebral body and pedicles with 85% vertebral body height loss centrally and 6 mm retropulsion of the posterior vertebral cortex. Large lesion involving the left pedicle, pars, and transverse process of L1 with possible nondisplaced pathologic fracture. Paraspinal and other soft tissues: No acute paraspinal soft tissue abnormality. Intra-abdominal and pelvic contents reported separately. 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 IMPRESSION: 1. Widespread lytic bone lesions consistent with metastatic disease. 2. Pathologic T12 and L4 vertebral fractures as above. Possible nondisplaced pathologic fracture of the left L1 posterior elements. 3. 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 Electronically Signed   By: Logan Bores M.D.   On: 04/02/2020 12:52   CT CHEST ABDOMEN PELVIS WO CONTRAST  Result Date: 04/02/2020 CLINICAL DATA:  Thoracic and lumbar spine pain. Pathologic fracture with hypercalcemia. EXAM: CT CHEST, ABDOMEN AND PELVIS WITHOUT CONTRAST TECHNIQUE: Multidetector CT imaging of the chest, abdomen and pelvis was performed following the standard protocol without IV contrast. COMPARISON:  Chest radiographs 04/02/2020. Thoracolumbar spine radiographs 03/07/2020. FINDINGS: CT CHEST FINDINGS Cardiovascular: Normal caliber of the thoracic aorta. Normal heart size. No pericardial effusion. Mediastinum/Nodes: No enlarged axillary, mediastinal, or hilar lymph nodes are identified with hilar assessment limited in the absence of IV contrast. Unremarkable visualized thyroid and esophagus. Lungs/Pleura: No pleural effusion or pneumothorax. Patchy and hazy ground-glass opacities in the left greater than right upper lobes. No parenchymal lung mass. Musculoskeletal: Widespread lytic bone lesions throughout the visualized skeleton including a destructive 5 cm lesion involving the posterior right third rib and a 3 cm destructive lesion involving the posteromedial right sixth rib. Smaller destructive lesions with nondisplaced pathologic fractures of the posterolateral right fourth and posterior left fourth ribs. Large lesion involving much of the T1 vertebral body with disruption of the posterior vertebral  body cortex but no gross epidural tumor. Pathologic vertebral body fractures with mild vertebral body height loss at T7 and T8 and moderate height loss at T12. CT ABDOMEN PELVIS FINDINGS Hepatobiliary: No focal liver abnormality is seen. No gallstones, gallbladder wall thickening, or biliary dilatation. Pancreas: Unremarkable. Spleen: Unremarkable. Adrenals/Urinary Tract: Unremarkable adrenal glands. No evidence of renal mass, calculi, or  hydronephrosis. Unremarkable bladder. Stomach/Bowel: The stomach is moderately distended by fluid and is otherwise unremarkable. There is no evidence of bowel obstruction or inflammation. The appendix is unremarkable. Vascular/Lymphatic: Normal caliber of the abdominal aorta. No enlarged lymph nodes. Reproductive: Grossly unremarkable uterus and ovaries. Other: No ascites. Musculoskeletal: Widespread lytic bone lesions throughout the spine and pelvis with the lumbar spine evaluated in detail on the separate dedicated spine CT. IMPRESSION: 1. Widespread lytic lesions throughout the visualized skeleton consistent with metastatic disease or multiple myeloma. No definite primary malignancy identified in the chest, abdomen, or pelvis. 2. Nonspecific pulmonary ground-glass opacities in the left greater than right upper lobes, likely infectious or inflammatory. Electronically Signed   By: Logan Bores M.D.   On: 04/02/2020 13:12    EKG: Independently reviewed.  Sinus tachycardia 145 bpm  Assessment/Plan Hypercalcemia & lytic bone lesions 2/2 suspected multiple myeloma: Acute.  Patient presents with severe back pain.  She had recently been taking Tums multiple times a day for indigestion.  On admission calcium level elevated at 14.5 and confirmed on repeat lab work.  She had initially received 2 L of Ringer's.  CT scan of the chest, abdomen, and pelvis noted widespread lytic lesions with pathologic compression fractures.  Given initial presentation with anemia, acute renal failure and widespread lytic bone lesions is very suggestive of multiple myeloma. -Admit to a progressive bed -Avoid Tums -Check urinalysis -Check SPEP, UPEP, and  peripheral smear -Normal saline IV fluids at 200 mL/h -Calcitonin 4 units/kg subcutaneous twice daily x4 doses -Oxycodone/fentanyl IV as needed for moderate to severe pain -Recheck BMP tonight at 8 PM  -Oncology consulted, we will follow-up for any further  recommendations.  Acute renal failure: Patient presents with creatinine elevated up to 3.37 with BUN 53.  Fractional excretion of sodium was 0.5% suggesting prerenal cause of symptoms. she reports still being able to make urine and per patient report lab work done from September 2021 showed her kidney function was within normal limits. -Strict intake and output -IV fluids as seen above -Continue to monitor kidney function  Hypertensive urgency/emergency: Acute.  On admission patient blood pressures elevated up to 191/113.  She is not on any antihypertensive medications at home. -Metoprolol IV as needed  Leukocytosis: Acute.  WBC elevated at 14.5 suspect secondary to above. -Follow-up urinalysis -Recheck CBC in a.m.  Hyponatremia: Acute. On admission sodium level noted to be 124.  Patient had been given started on lactated Ringer's. -Goal would be not to correct sodium or than 10 mmol in a 24-hour. -Adjust IV fluids as needed  Normocytic anemia: Acute. Hemoglobin 9.7 g/dL on admission.  Patient reports history of menorrhagia with irregular menstrual bleeding and had just recently ended her cycle. -Continue to monitor    DVT prophylaxis: Heparin Code Status: Full Family Communication: Husband updated at bedside Disposition Plan: Hopefully discharge home in 2 to 3 days depending on Consults called: Oncology Admission status: Inpatient, require more than 2 midnight stay for acute kidney failure  Norval Morton MD [facility]   If 7PM-7AM, please contact night-coverage   04/02/2020, 1:15 PM

## 2020-04-02 NOTE — ED Provider Notes (Signed)
[facility] EMERGENCY DEPARTMENT Provider Note   CSN: [id number] Arrival date & time: 04/02/20  0825     History Chief Complaint  Patient presents with  . Back Pain  . Tachycardia    Stacey Montoya is a 45 y.o. female.  HPI    Stacey Montoya is a 45 y.o. female with a 10-pack-year smoking history who is presenting today for evaluation of severe back pain. Patient reports that chiropractor performed a realignment of her back on 1/5. On 1/10 patient reports severe back pain. She denies any trauma to spine. Patient was seen at [facility] on 1/20 and was diagnosed with a T12 compound fracture, seen on thoracolumbar x-ray. Patient received percocet, cyclobenzaprine, and ondansetron. She reports pain has worsened and has become debilitating despite treatment. Reports she has had to live in a recliner for past 5 weeks. She endorses thoracolumbar pain that radiates down left leg. She reports weakness of bilateral legs (L>R) and diminished sensation of left leg. She endorses nightly fevers for four weeks. She is unsure if she has had weight loss. Denies drug use. Denies urinary retention. Endorses polydipsia, polyuria, nausea, constipation. Patient denies personal history of cancer, but endorses paternal grandfather diagnosed with pancreatic cancer and maternal grandfather had lung cancer.     Past Medical History:  Diagnosis Date  . Back pain     Patient Active Problem List   Diagnosis Date Noted  . Hyperglycemia 04/02/2020    History reviewed. No pertinent surgical history.   OB History   No obstetric history on file.     No family history on file.  Social History   Tobacco Use  . Smoking status: Never Smoker  . Smokeless tobacco: Never Used  Substance Use Topics  . Alcohol use: Not Currently  . Drug use: Not Currently    Home Medications Prior to Admission medications   Medication Sig Start Date End Date Taking?  Authorizing Provider  albuterol (VENTOLIN HFA) 108 (90 Base) MCG/ACT inhaler Inhale 1-2 puffs into the lungs as needed for wheezing. 02/01/20  Yes [provider]  cyclobenzaprine (FLEXERIL) 10 MG tablet Take 10 mg by mouth 3 (three) times daily. 03/08/20  Yes [provider]  norethindrone (MICRONOR) 0.35 MG tablet Take 1 tablet by mouth daily. 03/15/20  Yes [provider]  ondansetron (ZOFRAN) 4 MG tablet Take 4 mg by mouth every 8 (eight) hours. 03/19/20  Yes [provider]  oxyCODONE-acetaminophen (PERCOCET/ROXICET) 5-325 MG tablet Take 1 tablet by mouth every 4 (four) hours as needed for pain. 03/19/20  Yes [provider]    Allergies    Azithromycin  Review of Systems   Review of Systems  Constitutional: Positive for activity change, fatigue and fever.  Respiratory: Negative for shortness of breath.   Cardiovascular: Negative for chest pain.  Gastrointestinal: Positive for nausea. Negative for vomiting.  Musculoskeletal: Positive for back pain.  Neurological: Positive for numbness.  All other systems reviewed and are negative.   Physical Exam Updated Vital Signs BP (!) 191/113   Pulse (!) 130   Temp 99.5 F (37.5 C)   Resp 17   LMP 03/23/2020   SpO2 93%   Physical Exam Vitals and nursing note reviewed.  Constitutional:      Appearance: She is well-developed.  HENT:     Head: Normocephalic and atraumatic.  Eyes:     Extraocular Movements: EOM normal.  Cardiovascular:     Rate and Rhythm: Tachycardia present.  Pulmonary:     Effort: Pulmonary effort is normal.  Abdominal:     General: Bowel sounds are normal.  Musculoskeletal:        General: No swelling or tenderness.     Cervical back: Normal range of motion and neck supple.     Comments: Diffuse lumbar spine tenderness, : hip tenderness  Skin:    General: Skin is warm and dry.  Neurological:     Mental Status: She is alert and oriented to person, place, and time.      ED Results / Procedures / Treatments   Labs (all labs ordered are listed, but only abnormal results are displayed) Labs Reviewed  BASIC METABOLIC PANEL - Abnormal; Notable for the following components:      Result Value   Sodium 125 (*)    Chloride 94 (*)    Glucose, Bld 101 (*)    BUN 53 (*)    Creatinine, Ser 3.37 (*)    Calcium 14.5 (*)    GFR, Estimated 17 (*)    All other components within normal limits  CBC - Abnormal; Notable for the following components:   WBC 14.5 (*)    RBC 3.06 (*)    Hemoglobin 9.7 (*)    HCT 29.5 (*)    All other components within normal limits  BASIC METABOLIC PANEL - Abnormal; Notable for the following components:   Sodium 124 (*)    Potassium 3.3 (*)    Chloride 92 (*)    Glucose, Bld 105 (*)    BUN 53 (*)    Creatinine, Ser 3.38 (*)    Calcium 14.5 (*)    GFR, Estimated 17 (*)    All other components within normal limits  RAPID URINE DRUG SCREEN, HOSP PERFORMED - Abnormal; Notable for the following components:   Opiates POSITIVE (*)    All other components within normal limits  TROPONIN I (HIGH SENSITIVITY) - Abnormal; Notable for the following components:   Troponin I (High Sensitivity) 51 (*)    All other components within normal limits  TROPONIN I (HIGH SENSITIVITY) - Abnormal; Notable for the following components:   Troponin I (High Sensitivity) 68 (*)    All other components within normal limits  RESP PANEL BY RT-PCR (FLU A&B, COVID) ARPGX2  TSH  SODIUM, URINE, RANDOM  CREATININE, URINE, RANDOM  I-STAT BETA HCG BLOOD, ED ([facility], [facility], [facility] ONLY)    EKG EKG Interpretation  Date/Time:  Tuesday April 02 2020 08:31:55 EST Ventricular Rate:  145 PR Interval:  132 QRS Duration: 70 QT Interval:  266 QTC Calculation: 413 R Axis:   52 Text Interpretation: Sinus tachycardia Nonspecific ST and T wave abnormality Abnormal ECG No acute changes No old tracing to compare Confirmed by Varney Biles [phone]) on 04/02/2020 10:13:35  AM   Radiology DG Chest 2 View  Result Date: 04/02/2020 CLINICAL DATA:  45 year old female with tachycardia and low grade fever. EXAM: CHEST - 2 VIEW COMPARISON:  03/10/2018 FINDINGS: The heart size and mediastinal contours are within normal limits. Both lungs are clear. The visualized skeletal structures are unremarkable. IMPRESSION: No acute cardiopulmonary process. Electronically Signed   By: Ruthann Cancer MD   On: 04/02/2020 09:09   CT L-SPINE NO CHARGE  Result Date: 04/02/2020 CLINICAL DATA:  Back pain.  Pathologic fracture with hypercalcemia. EXAM: CT LUMBAR SPINE WITHOUT CONTRAST TECHNIQUE: Multidetector CT imaging of the lumbar spine was performed without intravenous contrast administration. Multiplanar CT image reconstructions were also generated. COMPARISON:  Thoracolumbar spine radiographs 03/07/2020 FINDINGS: Segmentation: 5 lumbar type vertebrae. Alignment: Straightening of the normal lumbar lordosis. No significant listhesis. Vertebrae: Widespread lytic bone lesions throughout the lumbar spine as well as included lower thoracic spine and pelvis. Pathologic T12 compression fracture with 50% vertebral body height loss, progressed from the prior radiographs. New pathologic L4 fracture involving the vertebral body and pedicles with 85% vertebral body height loss centrally and 6 mm retropulsion of the posterior vertebral cortex. Large lesion involving the left pedicle, pars, and transverse process of L1 with possible nondisplaced pathologic fracture. Paraspinal and other soft tissues: No acute paraspinal soft tissue abnormality. Intra-abdominal and pelvic contents reported separately. 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 IMPRESSION: 1. Widespread lytic bone lesions consistent with metastatic disease. 2. Pathologic T12 and L4 vertebral fractures as above. Possible nondisplaced pathologic fracture of the left L1 posterior elements. 3. 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 Electronically Signed   By: Logan Bores M.D.   On: 04/02/2020 12:52   CT CHEST ABDOMEN PELVIS WO CONTRAST  Result Date: 04/02/2020 CLINICAL DATA:  Thoracic and lumbar spine pain. Pathologic fracture with hypercalcemia. EXAM: CT CHEST, ABDOMEN AND PELVIS WITHOUT CONTRAST TECHNIQUE: Multidetector CT imaging of the chest, abdomen and pelvis was performed following the standard protocol without IV contrast. COMPARISON:  Chest radiographs 04/02/2020. Thoracolumbar spine radiographs 03/07/2020. FINDINGS: CT CHEST FINDINGS Cardiovascular: Normal caliber of the thoracic aorta. Normal heart size. No pericardial effusion. Mediastinum/Nodes: No enlarged axillary, mediastinal, or hilar lymph nodes are identified with hilar assessment limited in the absence of IV contrast. Unremarkable visualized thyroid and esophagus. Lungs/Pleura: No pleural effusion or pneumothorax. Patchy and hazy ground-glass opacities in the left greater than right upper lobes. No parenchymal lung mass. Musculoskeletal: Widespread lytic bone lesions throughout the visualized skeleton including a destructive 5 cm lesion involving the posterior right third rib and a 3 cm destructive lesion involving the posteromedial right sixth rib. Smaller destructive lesions with nondisplaced pathologic fractures of the posterolateral right fourth and posterior left fourth ribs. Large lesion involving much of the T1 vertebral body with disruption of the posterior vertebral body cortex but no gross epidural tumor. Pathologic vertebral body fractures with mild vertebral body height loss at T7 and T8 and moderate height loss at T12. CT ABDOMEN PELVIS FINDINGS Hepatobiliary: No focal liver abnormality is seen. No gallstones, gallbladder wall thickening, or biliary dilatation. Pancreas: Unremarkable. Spleen: Unremarkable. Adrenals/Urinary Tract: Unremarkable adrenal glands. No evidence of renal mass, calculi, or  hydronephrosis. Unremarkable bladder. Stomach/Bowel: The stomach is moderately distended by fluid and is otherwise unremarkable. There is no evidence of bowel obstruction or inflammation. The appendix is unremarkable. Vascular/Lymphatic: Normal caliber of the abdominal aorta. No enlarged lymph nodes. Reproductive: Grossly unremarkable uterus and ovaries. Other: No ascites. Musculoskeletal: Widespread lytic bone lesions throughout the spine and pelvis with the lumbar spine evaluated in detail on the separate dedicated spine CT. IMPRESSION: 1. Widespread lytic lesions throughout the visualized skeleton consistent with metastatic disease or multiple myeloma. No definite primary malignancy identified in the chest, abdomen, or pelvis. 2. Nonspecific pulmonary ground-glass opacities in the left greater than right upper lobes, likely infectious or inflammatory. Electronically Signed   By: Logan Bores M.D.   On: 04/02/2020 13:12    Procedures .Critical Care Performed by: Varney Biles, MD Authorized by: Varney Biles, MD   Critical care provider statement:    Critical care time (minutes):  52   Critical care  was necessary to treat or prevent imminent or life-threatening deterioration of the following conditions:  Metabolic crisis and circulatory failure   Critical care was time spent personally by me on the following activities:  Discussions with consultants, evaluation of patient's response to treatment, examination of patient, ordering and performing treatments and interventions, ordering and review of laboratory studies, ordering and review of radiographic studies, pulse oximetry, re-evaluation of patient's condition, obtaining history from patient or surrogate and review of old charts     Medications Ordered in ED Medications  morphine 4 MG/ML injection 4 mg (4 mg Intravenous Given 04/02/20 1149)  0.9 %  sodium chloride infusion (has no administration in time range)  fentaNYL (SUBLIMAZE) injection  50 mcg (has no administration in time range)  lactated ringers bolus 2,000 mL (2,000 mLs Intravenous New Bag/Given 04/02/20 1147)  ondansetron (ZOFRAN) injection 4 mg (4 mg Intravenous Given 04/02/20 1149)  fentaNYL (SUBLIMAZE) injection 100 mcg (100 mcg Intravenous Given 04/02/20 1332)    ED Course  I have reviewed the triage vital signs and the nursing notes.  Pertinent labs & imaging results that were available during my care of the patient were reviewed by me and considered in my medical decision making (see chart for details).    MDM Rules/Calculators/A&P                         CC: back pain. 45 year old female comes in a chief complaint of worsening back pain.  Few days ago she was diagnosed with T12 fracture.  There was no trauma.  Given his history, concerns are high for pathologic fracture.  Review of system is positive for fevers.  Patient is up-to-date with her cancer screening.  There is no family history of young people with cancer either.  Labs are also concerning.  Patient has profound hypercalcemia with calcium over 14.  With this she is tachycardic but does not have any QT prolongation.  Some of her pain could be attributed to the hypercalcemia.  Patient's creatinine is also elevated.  We have decided to get CT abdomen chest and pelvis without contrast with reformats of the lumbar spine.  2:39 PM Foley hypercalcemia, patient received 2 L of IV fluid.  Hospitalist to manage hypercalcemia further.  CT scan findings are concerning for metastatic cancer.  Primary is unknown at this time.  Hospitalist reports that he will also consult oncology for formal appointment.  Patient is stable for admission.  Results of the ER work-up discussed with her.  Final Clinical Impression(s) / ED Diagnoses Final diagnoses:  Back pain  AKI (acute kidney injury) ([place])  Hypercalcemia  Metastatic cancer to spine ([place])  Tachycardia    Rx / DC Orders ED Discharge Orders    None        Varney Biles, MD 04/02/20 1440

## 2020-04-03 ENCOUNTER — Ambulatory Visit
Admit: 2020-04-03 | Discharge: 2020-04-03 | Disposition: A | Discharge status: Home / Self Care | Payer: BC Managed Care – PPO | Source: Ambulatory Visit | Attending: Radiation Oncology | Admitting: Radiation Oncology

## 2020-04-03 ENCOUNTER — Inpatient Hospital Stay (HOSPITAL_COMMUNITY): Payer: BC Managed Care – PPO

## 2020-04-03 ENCOUNTER — Encounter (HOSPITAL_COMMUNITY): Payer: Self-pay | Admitting: Internal Medicine

## 2020-04-03 DIAGNOSIS — I160 Hypertensive urgency: Secondary | ICD-10-CM | POA: Diagnosis not present

## 2020-04-03 DIAGNOSIS — D649 Anemia, unspecified: Secondary | ICD-10-CM

## 2020-04-03 DIAGNOSIS — N179 Acute kidney failure, unspecified: Secondary | ICD-10-CM | POA: Diagnosis not present

## 2020-04-03 DIAGNOSIS — G893 Neoplasm related pain (acute) (chronic): Secondary | ICD-10-CM

## 2020-04-03 DIAGNOSIS — C9000 Multiple myeloma not having achieved remission: Secondary | ICD-10-CM

## 2020-04-03 DIAGNOSIS — K5903 Drug induced constipation: Secondary | ICD-10-CM

## 2020-04-03 DIAGNOSIS — E871 Hypo-osmolality and hyponatremia: Secondary | ICD-10-CM | POA: Diagnosis not present

## 2020-04-03 DIAGNOSIS — Z510 Encounter for antineoplastic radiation therapy: Secondary | ICD-10-CM | POA: Insufficient documentation

## 2020-04-03 DIAGNOSIS — M549 Dorsalgia, unspecified: Secondary | ICD-10-CM | POA: Diagnosis not present

## 2020-04-03 DIAGNOSIS — S3991XA Unspecified injury of abdomen, initial encounter: Secondary | ICD-10-CM | POA: Diagnosis not present

## 2020-04-03 LAB — COMPREHENSIVE METABOLIC PANEL
ALT: 26 U/L (ref 0–44)
AST: 16 U/L (ref 15–41)
Albumin: 2.2 g/dL — ABNORMAL LOW (ref 3.5–5.0)
Alkaline Phosphatase: 219 U/L — ABNORMAL HIGH (ref 38–126)
Anion gap: 6 (ref 5–15)
BUN: 41 mg/dL — ABNORMAL HIGH (ref 6–20)
CO2: 21 mmol/L — ABNORMAL LOW (ref 22–32)
Calcium: 12.1 mg/dL — ABNORMAL HIGH (ref 8.9–10.3)
Chloride: 104 mmol/L (ref 98–111)
Creatinine, Ser: 2.62 mg/dL — ABNORMAL HIGH (ref 0.44–1.00)
GFR, Estimated: 23 mL/min — ABNORMAL LOW (ref >60)
Glucose, Bld: 130 mg/dL — ABNORMAL HIGH (ref 70–99)
Potassium: 3.5 mmol/L (ref 3.5–5.1)
Sodium: 131 mmol/L — ABNORMAL LOW (ref 135–145)
Total Bilirubin: 0.4 mg/dL (ref 0.3–1.2)
Total Protein: 9.9 g/dL — ABNORMAL HIGH (ref 6.5–8.1)

## 2020-04-03 LAB — MRSA PCR SCREENING: MRSA by PCR: NEGATIVE

## 2020-04-03 LAB — CBC
HCT: 25.2 % — ABNORMAL LOW (ref 36.0–46.0)
Hemoglobin: 8.3 g/dL — ABNORMAL LOW (ref 12.0–15.0)
MCH: 31.3 pg (ref 26.0–34.0)
MCHC: 32.9 g/dL (ref 30.0–36.0)
MCV: 95.1 fL (ref 80.0–100.0)
Platelets: 316 10*3/uL (ref 150–400)
RBC: 2.65 MIL/uL — ABNORMAL LOW (ref 3.87–5.11)
RDW: 14.8 % (ref 11.5–15.5)
WBC: 11.4 10*3/uL — ABNORMAL HIGH (ref 4.0–10.5)
nRBC: 0 % (ref 0.0–0.2)

## 2020-04-03 LAB — URINALYSIS, ROUTINE W REFLEX MICROSCOPIC
Bilirubin Urine: NEGATIVE
Glucose, UA: NEGATIVE mg/dL
Ketones, ur: NEGATIVE mg/dL
Leukocytes,Ua: NEGATIVE
Nitrite: NEGATIVE
Protein, ur: 30 mg/dL — AB
Specific Gravity, Urine: 1.011 (ref 1.005–1.030)
pH: 5 (ref 5.0–8.0)

## 2020-04-03 LAB — BETA 2 MICROGLOBULIN, SERUM: Beta-2 Microglobulin: 11.1 mg/L — ABNORMAL HIGH (ref 0.6–2.4)

## 2020-04-03 LAB — KAPPA/LAMBDA LIGHT CHAINS
Kappa free light chain: 19.2 mg/L (ref 3.3–19.4)
Kappa, lambda light chain ratio: 1.88 — ABNORMAL HIGH (ref 0.26–1.65)
Lambda free light chains: 10.2 mg/L (ref 5.7–26.3)

## 2020-04-03 MED ORDER — FENTANYL CITRATE (PF) 100 MCG/2ML IJ SOLN
INTRAMUSCULAR | Status: AC | PRN
  Administered 2020-04-03: 100 ug via INTRAVENOUS
  Administered 2020-04-03: 50 ug via INTRAVENOUS

## 2020-04-03 MED ORDER — LIDOCAINE HCL 1 % IJ SOLN
INTRAMUSCULAR | Status: AC
  Filled 2020-04-03: qty 20

## 2020-04-03 MED ORDER — FENTANYL 12 MCG/HR TD PT72
1.0000 | MEDICATED_PATCH | TRANSDERMAL | Status: DC
  Administered 2020-04-03 – 2020-04-06 (×2): 1 via TRANSDERMAL
  Filled 2020-04-03 (×2): qty 1

## 2020-04-03 MED ORDER — POLYETHYLENE GLYCOL 3350 17 G PO PACK: 17.0000 g | PACK | Freq: Every day | ORAL | Status: DC

## 2020-04-03 MED ORDER — METOPROLOL TARTRATE 25 MG PO TABS
12.5000 mg | ORAL_TABLET | Freq: Two times a day (BID) | ORAL | Status: DC
  Administered 2020-04-03 – 2020-04-04 (×3): 12.5 mg via ORAL
  Filled 2020-04-03 (×3): qty 1

## 2020-04-03 MED ORDER — POLYETHYLENE GLYCOL 3350 17 G PO PACK
17.0000 g | PACK | Freq: Every day | ORAL | Status: DC
  Administered 2020-04-05 – 2020-04-09 (×5): 17 g via ORAL
  Filled 2020-04-03 (×7): qty 1

## 2020-04-03 MED ORDER — FENTANYL CITRATE (PF) 100 MCG/2ML IJ SOLN
INTRAMUSCULAR | Status: AC
  Filled 2020-04-03: qty 4

## 2020-04-03 MED ORDER — OXYCODONE HCL 5 MG PO TABS
10.0000 mg | ORAL_TABLET | ORAL | Status: DC | PRN
  Administered 2020-04-03 – 2020-04-09 (×19): 10 mg via ORAL
  Filled 2020-04-03 (×22): qty 2

## 2020-04-03 MED ORDER — MIDAZOLAM HCL 2 MG/2ML IJ SOLN
INTRAMUSCULAR | Status: AC
  Filled 2020-04-03: qty 6

## 2020-04-03 MED ORDER — GABAPENTIN 100 MG PO CAPS
200.0000 mg | ORAL_CAPSULE | Freq: Two times a day (BID) | ORAL | Status: DC
  Administered 2020-04-03 – 2020-04-04 (×3): 200 mg via ORAL
  Filled 2020-04-03 (×3): qty 2

## 2020-04-03 MED ORDER — MAGNESIUM CITRATE PO SOLN
0.5000 | Freq: Once | ORAL | Status: AC
  Administered 2020-04-03: 0.5 via ORAL
  Filled 2020-04-03 (×2): qty 296

## 2020-04-03 MED ORDER — MIDAZOLAM HCL 2 MG/2ML IJ SOLN
INTRAMUSCULAR | Status: AC | PRN
  Administered 2020-04-03: 1 mg via INTRAVENOUS
  Administered 2020-04-03: 2 mg via INTRAVENOUS
  Administered 2020-04-03: 1 mg via INTRAVENOUS

## 2020-04-03 MED ORDER — SENNOSIDES-DOCUSATE SODIUM 8.6-50 MG PO TABS
2.0000 | ORAL_TABLET | Freq: Two times a day (BID) | ORAL | Status: DC
  Administered 2020-04-03 – 2020-04-08 (×12): 2 via ORAL
  Filled 2020-04-03 (×12): qty 2

## 2020-04-03 MED ORDER — VITAMIN D 25 MCG (1000 UNIT) PO TABS
2000.0000 [IU] | ORAL_TABLET | Freq: Every day | ORAL | Status: DC
Start: 2020-04-03 — End: 2020-04-09
  Administered 2020-04-03 – 2020-04-09 (×7): 2000 [IU] via ORAL
  Filled 2020-04-03 (×7): qty 2

## 2020-04-03 NOTE — H&P (Addendum)
Chief Complaint: Patient was seen in consultation today for image guided bone marrow aspiration and biopsy Chief Complaint  Patient presents with   Back Pain   Tachycardia   at the request of Dr. Irene Limbo  Referring Physician(s): Dr. Irene Limbo  Supervising Physician: Jacqulynn Cadet  Patient Status: [facility] - In-pt  History of Present Illness: Stacey Montoya is a 45 y.o. female with no significant past medical history, she is a new patient to our service.  Patient presented to the emergency department on February 15 with chief complain of severe back pain.  Labs showed hypercalcemia, CT scan showed widespread lytic lesions on skeleton consistent with metastatic disease or multiple myeloma.  She has no known primary cancer diagnosis.  Patient was referred by Dr. Irene Limbo for image guided bone marrow aspiration and biopsy for further evaluation.   Patient laying in bed. States she has a severe left hip pain.  Denise headache, fever, chills, shortness of breath, cough, chest pain, abdominal pain, nausea ,vomiting, and bleeding.   Past Medical History:  Diagnosis Date   Back pain     History reviewed. No pertinent surgical history.  Allergies: Azithromycin  Medications: Prior to Admission medications   Medication Sig Start Date End Date Taking? Authorizing Provider  albuterol (VENTOLIN HFA) 108 (90 Base) MCG/ACT inhaler Inhale 1-2 puffs into the lungs as needed for wheezing. 02/01/20  Yes [provider]  cyclobenzaprine (FLEXERIL) 10 MG tablet Take 10 mg by mouth 3 (three) times daily. 03/08/20  Yes [provider]  norethindrone (MICRONOR) 0.35 MG tablet Take 1 tablet by mouth daily. 03/15/20  Yes [provider]  ondansetron (ZOFRAN) 4 MG tablet Take 4 mg by mouth every 8 (eight) hours. 03/19/20  Yes [provider]  oxyCODONE-acetaminophen (PERCOCET/ROXICET) 5-325 MG tablet Take 1 tablet by mouth every 4 (four) hours as needed for pain. 03/19/20   Yes [provider]     Family History  Problem Relation Age of Onset   Lung cancer Maternal Grandfather    Pancreatic cancer Paternal Grandfather     Social History   Socioeconomic History   Marital status: Married    Spouse name: Not on file   Number of children: Not on file   Years of education: Not on file   Highest education level: Not on file  Occupational History   Not on file  Tobacco Use   Smoking status: Current Every Day Smoker    Packs/day: 0.50    Years: 20.00    Pack years: 10.00    Types: Cigarettes   Smokeless tobacco: Never Used  Scientific laboratory technician Use: Every day  Substance and Sexual Activity   Alcohol use: Not Currently   Drug use: Not Currently   Sexual activity: Not on file  Other Topics Concern   Not on file  Social History Narrative   Not on file   Social Determinants of Health   Financial Resource Strain: Not on file  Food Insecurity: Not on file  Transportation Needs: Not on file  Physical Activity: Not on file  Stress: Not on file  Social Connections: Not on file     Review of Systems: A 12 point ROS discussed and pertinent positives are indicated in the HPI above.  All other systems are negative.   Vital Signs: BP (!) 164/96 (BP Location: Left Arm)    Pulse (!) 123    Temp 98.6 F (37 C) (Oral)    Resp 16  Ht 5' 4"  (1.626 m)    Wt 240 lb (108.9 kg)    LMP 03/23/2020    SpO2 95%    BMI 41.20 kg/m   Constitutional:      Appearance: Normal appearance.  Cardiovascular:     Rate and Rhythm: Tachycardic, regular rhythm.     Pulses: Normal pulses.     Heart sounds: Normal heart sounds.  Pulmonary:     Effort: Pulmonary effort is normal.     Breath sounds: Normal breath sounds.  Abdominal:     General: Abdomen is flat. Bowel sounds are normal.     Palpations: Abdomen is soft.  Neurological:     Mental Status: Patient is alert and oriented to person, place, and time.  Psychiatric:        Mood and  Affect: Mood normal.        Behavior: Behavior normal.        Thought Content: Thought content normal.        Judgment: Judgment normal.    MD Evaluation Airway: WNL Heart: Other (comments) Heart  comments: tachycardic Abdomen: WNL Chest/ Lungs: WNL ASA  Classification: 2 Mallampati/Airway Score: Two  Imaging: DG Chest 2 View  Result Date: 04/02/2020 CLINICAL DATA:  45 year old female with tachycardia and low grade fever. EXAM: CHEST - 2 VIEW COMPARISON:  03/10/2018 FINDINGS: The heart size and mediastinal contours are within normal limits. Both lungs are clear. The visualized skeletal structures are unremarkable. IMPRESSION: No acute cardiopulmonary process. Electronically Signed   By: Ruthann Cancer MD   On: 04/02/2020 09:09   CT L-SPINE NO CHARGE  Result Date: 04/02/2020 CLINICAL DATA:  Back pain.  Pathologic fracture with hypercalcemia. EXAM: CT LUMBAR SPINE WITHOUT CONTRAST TECHNIQUE: Multidetector CT imaging of the lumbar spine was performed without intravenous contrast administration. Multiplanar CT image reconstructions were also generated. COMPARISON:  Thoracolumbar spine radiographs 03/07/2020 FINDINGS: Segmentation: 5 lumbar type vertebrae. Alignment: Straightening of the normal lumbar lordosis. No significant listhesis. Vertebrae: Widespread lytic bone lesions throughout the lumbar spine as well as included lower thoracic spine and pelvis. Pathologic T12 compression fracture with 50% vertebral body height loss, progressed from the prior radiographs. New pathologic L4 fracture involving the vertebral body and pedicles with 85% vertebral body height loss centrally and 6 mm retropulsion of the posterior vertebral cortex. Large lesion involving the left pedicle, pars, and transverse process of L1 with possible nondisplaced pathologic fracture. Paraspinal and other soft tissues: No acute paraspinal soft tissue abnormality. Intra-abdominal and pelvic contents reported separately. 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 IMPRESSION: 1. Widespread lytic bone lesions consistent with metastatic disease. 2. Pathologic T12 and L4 vertebral fractures as above. Possible nondisplaced pathologic fracture of the left L1 posterior elements. 3. 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 Electronically Signed   By: Logan Bores M.D.   On: 04/02/2020 12:52   DG Bone Survey Met  Result Date: 04/02/2020 CLINICAL DATA:  Lytic lesion on x-ray.  Possible multiple myeloma. EXAM: METASTATIC BONE SURVEY COMPARISON:  CT of same day. FINDINGS: Ill-defined lucencies are noted throughout the skull which may represent lytic lesions. Lucencies are noted in the bilateral humeri. Lytic lesions are seen involving the right third and fourth ribs. Probable lytic lesion is seen involving the left fourth rib. Lytic lesions are noted in the left scapula. Fractures are seen involving the T12 and L4 vertebral bodies consistent with pathologic fractures. Lucencies are seen throughout the  pelvis and visualized proximal femurs consistent with lytic lesions. IMPRESSION: Multiple lytic lesions are noted consistent with multiple myeloma or metastatic disease. Pathologic fractures are seen involving the T12 and L4 vertebral bodies. Electronically Signed   By: Marijo Conception M.D.   On: 04/02/2020 15:29   DG FEMUR PORT MIN 2 VIEWS LEFT  Result Date: 04/02/2020 CLINICAL DATA:  Bilateral thigh pain.  History of myeloma. EXAM: LEFT FEMUR PORTABLE 2 VIEWS COMPARISON:  Femur exam on bone survey earlier today. Included portion from CT earlier today. FINDINGS: Lucent lesion in the lesser trochanter was better appreciated on CT earlier today. There are multiple small lucencies involving the femoral head and intertrochanteric femur that are not well seen by radiograph. No obvious destructive lesion in the femoral shaft. No evidence of pathologic fracture. IMPRESSION: Proximal  femoral lesions including a lesion of the lesser trochanter, majority of these are better seen on CT earlier today. There is no evidence of acute femur fracture or large destructive lesion. Consider MRI for more detailed assessment. Electronically Signed   By: Keith Rake M.D.   On: 04/02/2020 21:21   DG FEMUR PORT, MIN 2 VIEWS RIGHT  Result Date: 04/02/2020 CLINICAL DATA:  Right thigh pain.  Myeloma. EXAM: RIGHT FEMUR PORTABLE 2 VIEW COMPARISON:  Included portion from bone survey earlier today. Included portion from pelvis CT earlier today. FINDINGS: Lucent lesion in the intertrochanteric femur on CT earlier today is only faintly visualized, this is at risk for pathologic fracture. There is some thinning of the lateral cortex of the greater trochanter. There are multiple additional small lucencies in the femoral head on CT that are not well seen by radiograph. Small lesion noted in the distal lateral aspect of the femur in the region of the physeal scar. No evidence of acute fracture. IMPRESSION: Relatively large lucent lesion in the intertrochanteric femur on CT earlier today is only faintly visualized, this is at risk for pathologic fracture. There are multiple additional lucencies in the femoral head on CT are not well seen by radiograph. There is a small lesion in the distal lateral femoral metaphysis. MRI may be of value for more detailed assessment. Electronically Signed   By: Keith Rake M.D.   On: 04/02/2020 21:23   CT CHEST ABDOMEN PELVIS WO CONTRAST  Result Date: 04/02/2020 CLINICAL DATA:  Thoracic and lumbar spine pain. Pathologic fracture with hypercalcemia. EXAM: CT CHEST, ABDOMEN AND PELVIS WITHOUT CONTRAST TECHNIQUE: Multidetector CT imaging of the chest, abdomen and pelvis was performed following the standard protocol without IV contrast. COMPARISON:  Chest radiographs 04/02/2020. Thoracolumbar spine radiographs 03/07/2020. FINDINGS: CT CHEST FINDINGS Cardiovascular: Normal caliber  of the thoracic aorta. Normal heart size. No pericardial effusion. Mediastinum/Nodes: No enlarged axillary, mediastinal, or hilar lymph nodes are identified with hilar assessment limited in the absence of IV contrast. Unremarkable visualized thyroid and esophagus. Lungs/Pleura: No pleural effusion or pneumothorax. Patchy and hazy ground-glass opacities in the left greater than right upper lobes. No parenchymal lung mass. Musculoskeletal: Widespread lytic bone lesions throughout the visualized skeleton including a destructive 5 cm lesion involving the posterior right third rib and a 3 cm destructive lesion involving the posteromedial right sixth rib. Smaller destructive lesions with nondisplaced pathologic fractures of the posterolateral right fourth and posterior left fourth ribs. Large lesion involving much of the T1 vertebral body with disruption of the posterior vertebral body cortex but no gross epidural tumor. Pathologic vertebral body fractures with mild vertebral body height loss at T7 and T8 and  moderate height loss at T12. CT ABDOMEN PELVIS FINDINGS Hepatobiliary: No focal liver abnormality is seen. No gallstones, gallbladder wall thickening, or biliary dilatation. Pancreas: Unremarkable. Spleen: Unremarkable. Adrenals/Urinary Tract: Unremarkable adrenal glands. No evidence of renal mass, calculi, or hydronephrosis. Unremarkable bladder. Stomach/Bowel: The stomach is moderately distended by fluid and is otherwise unremarkable. There is no evidence of bowel obstruction or inflammation. The appendix is unremarkable. Vascular/Lymphatic: Normal caliber of the abdominal aorta. No enlarged lymph nodes. Reproductive: Grossly unremarkable uterus and ovaries. Other: No ascites. Musculoskeletal: Widespread lytic bone lesions throughout the spine and pelvis with the lumbar spine evaluated in detail on the separate dedicated spine CT. IMPRESSION: 1. Widespread lytic lesions throughout the visualized skeleton consistent  with metastatic disease or multiple myeloma. No definite primary malignancy identified in the chest, abdomen, or pelvis. 2. Nonspecific pulmonary ground-glass opacities in the left greater than right upper lobes, likely infectious or inflammatory. Electronically Signed   By: Logan Bores M.D.   On: 04/02/2020 13:12    Labs:  CBC: Recent Labs    04/02/20 0843 04/03/20 0432  WBC 14.5* 11.4*  HGB 9.7* 8.3*  HCT 29.5* 25.2*  PLT 387 316    COAGS: No results for input(s): INR, APTT in the last 8760 hours.  BMP: Recent Labs    04/02/20 0843 04/02/20 1015 04/02/20 1850 04/03/20 0432  NA 125* 124* 127* 131*  K 3.9 3.3* 3.7 3.5  CL 94* 92* 97* 104  CO2 23 24 25  21*  GLUCOSE 101* 105* 98 130*  BUN 53* 53* 49* 41*  CALCIUM 14.5* 14.5* 13.4* 12.1*  CREATININE 3.37* 3.38* 3.02* 2.62*  GFRNONAA 17* 17* 19* 23*    LIVER FUNCTION TESTS: Recent Labs    04/02/20 1800 04/03/20 0432  BILITOT 0.6 0.4  AST 18 16  ALT 27 26  ALKPHOS 218* 219*  PROT 9.9* 9.9*  ALBUMIN 2.3* 2.2*    TUMOR MARKERS: No results for input(s): AFPTM, CEA, CA199, CHROMGRNA in the last 8760 hours.  Assessment and Plan: 45 y.o. female with recent finding of hypercalcemia and lytic lesions on skeleton shown on CT scan with no known primary cancer diagnosis.  Patient has been referred to IR for image guided bone marrow aspiration and biopsy for further evaluation.   Risks and benefits of image guided bone marrow aspiration and biopsy was discussed with the patient and/or patient's family including, but not limited to bleeding, infection, damage to adjacent structures or low yield requiring additional tests.  All of the questions were answered and there is agreement to proceed.  Consent signed and in chart.    Thank you for this interesting consult.  I greatly enjoyed meeting Stacey Montoya and look forward to participating in their care.  A copy of this report was sent to the requesting provider on  this date.  Electronically Signed: Tera Mater, PA-C 04/03/2020, 8:54 AM   I spent a total of 20 Minutes   in face to face in clinical consultation, greater than 50% of which was counseling/coordinating care for image guided bone marrow aspiration and biopsy.

## 2020-04-03 NOTE — Consult Note (Signed)
Radiation Oncology         [phone]) [phone] ________________________________  Name: Stacey Montoya        MRN: [id number]  Date of Service: 04/03/20 DOB: Dec 08, 1975  ZY:YQMG, Edwinna Areola, MD    REFERRING PHYSICIAN: Dr. Irene Limbo  DIAGNOSIS: The primary encounter diagnosis was AKI (acute kidney injury) ([place]). Diagnoses of Back pain, Abdominal trauma, Hypercalcemia, Metastatic cancer to spine ([place]), Tachycardia, Lytic bone lesions on xray, Multiple myeloma ([facility]), Multiple myeloma ([place]), Pain, Pain, Multiple myeloma not having achieved remission ([place]), Hypercalcemia of malignancy, and Vitamin D deficiency were also pertinent to this visit.   HISTORY OF PRESENT ILLNESS: Stacey Montoya is a 45 y.o. female seen at the request of Dr. Irene Limbo for a probable new diagnosis of multiple myeloma. The patient has been having fevers and progressive low back pain since January 2022. She presented yesterday to the ED and was found to acute acute renal injury, hypercalcemia, anemia, and widespread lytic lesions throughout the skeleton without evidence by CT CAP of a primary solid tumor malignancy. She has had myeloma panels and bone marrow biopsy are pending. She was seen on 03/07/20 and found to have a compression fracture at T12 with outside imaging at [facility]. Symptoms progressed leading to her ED visit, but on CT in the ED of the lumbar spine she has findings concerning for disease throughout the lumbar spine but it sounds like Dr. Zada Finders was thinking that her radiculopathy was more from L5. She also has a destructive lesion in the intertrochanteric right femur seen best on CT, and lucencies in the femoral head on the left. She's contacted today to discuss urgent radiotherapy.   PREVIOUS RADIATION THERAPY: No   PAST MEDICAL HISTORY:  Past Medical History:  Diagnosis Date  . Back pain        PAST SURGICAL HISTORY:History reviewed. No pertinent surgical history.   FAMILY HISTORY:   Family History  Problem Relation Age of Onset  . Lung cancer Maternal Grandfather   . Pancreatic cancer Paternal Grandfather      SOCIAL HISTORY:  reports that she has been smoking cigarettes. She has a 10.00 pack-year smoking history. She has never used smokeless tobacco. She reports previous alcohol use. She reports previous drug use. 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 She is married and lives in [place] and has a 2 year old son.  ALLERGIES: Azithromycin   MEDICATIONS:  Current Facility-Administered Medications  Medication Dose Route Frequency Provider Last Rate Last Admin  . 0.9 %  sodium chloride infusion   Intravenous Continuous Smith, Rondell A, MD 200 mL/hr at 04/03/20 0400 Infusion Verify at 04/03/20 0400  . albuterol (PROVENTIL) (2.5 MG/3ML) 0.083% nebulizer solution 2.5 mg  2.5 mg Nebulization Q4H PRN Fuller Plan A, MD      . calcitonin (MIACALCIN) injection 400 Units  400 Units Subcutaneous BID Fuller Plan A, MD   400 Units at 04/03/20 1025  . cholecalciferol (VITAMIN D3) tablet 2,000 Units  2,000 Units Oral Daily Brunetta Genera, MD   2,000 Units at 04/03/20 1239  . dexamethasone (DECADRON) injection 20 mg  20 mg Intravenous Q breakfast Brunetta Genera, MD   20 mg at 04/03/20 1329  . fentaNYL (SUBLIMAZE) injection 25 mcg  25 mcg Intravenous Q2H PRN Fuller Plan A, MD   25 mcg at 04/03/20 0655  . heparin injection 5,000 Units  5,000 Units Subcutaneous Q8H Fuller Plan A, MD   5,000 Units at 04/03/20 1329  .  labetalol (NORMODYNE) injection 10 mg  10 mg Intravenous Q2H PRN Opyd, Ilene Qua, MD      . lidocaine (XYLOCAINE) 1 % (with pres) injection           . LORazepam (ATIVAN) tablet 0.5-1 mg  0.5-1 mg Oral Q8H PRN Brunetta Genera, MD   0.5 mg at 04/02/20 2206  . metoprolol tartrate (LOPRESSOR) tablet 12.5 mg  12.5 mg Oral BID Caren Griffins, MD   12.5 mg at 04/03/20 1238  . nicotine (NICODERM CQ - dosed in  mg/24 hours) patch 21 mg  21 mg Transdermal Daily Tamala Julian, Rondell A, MD   21 mg at 04/03/20 1022  . ondansetron (ZOFRAN) tablet 4 mg  4 mg Oral Q6H PRN Fuller Plan A, MD   4 mg at 04/03/20 0818   Or  . ondansetron (ZOFRAN) injection 4 mg  4 mg Intravenous Q6H PRN Fuller Plan A, MD   4 mg at 04/02/20 1745  . oxyCODONE (Oxy IR/ROXICODONE) immediate release tablet 10 mg  10 mg Oral Q4H PRN Caren Griffins, MD   10 mg at 04/03/20 1238  . senna-docusate (Senokot-S) tablet 2 tablet  2 tablet Oral BID Caren Griffins, MD   2 tablet at 04/03/20 1329  . sodium chloride flush (NS) 0.9 % injection 3 mL  3 mL Intravenous Q12H Smith, Rondell A, MD   3 mL at 04/03/20 1026     REVIEW OF SYSTEMS: On review of systems, the patient reports that she has really had pain in her left hip area that radiates down laterally from her low back and shoots into the front of her lower leg. She is not having any loss of control of bowel or bladder or frank weakness, limitations are by pain. No other complaints are noted.     PHYSICAL EXAM:  Wt Readings from Last 3 Encounters:  04/02/20 240 lb (108.9 kg)   Temp Readings from Last 3 Encounters:  04/03/20 98.6 F (37 C) (Oral)   BP Readings from Last 3 Encounters:  04/03/20 (!) 150/91   Pulse Readings from Last 3 Encounters:  04/03/20 (!) 119   Pain Assessment Pain Score: 6 /10  Unable to assess due to encounter type.   ECOG = 2  0 - Asymptomatic (Fully active, able to carry on all predisease activities without restriction)  1 - Symptomatic but completely ambulatory (Restricted in physically strenuous activity but ambulatory and able to carry out work of a light or sedentary nature. For example, light housework, office work)  2 - Symptomatic, <50% in bed during the day (Ambulatory and capable of all self care but unable to carry out any work activities. Up and about more than 50% of waking hours)  3 - Symptomatic, >50% in bed, but not bedbound  (Capable of only limited self-care, confined to bed or chair 50% or more of waking hours)  4 - Bedbound (Completely disabled. Cannot carry on any self-care. Totally confined to bed or chair)  5 - Death   Eustace Pen MM, Creech RH, Tormey DC, et al. [phone]). "Toxicity and response criteria of the [facility]". [place] Oncol. 5 (6): 649-55    LABORATORY DATA:  Lab Results  Component Value Date   WBC 11.4 (H) 04/03/2020   HGB 8.3 (L) 04/03/2020   HCT 25.2 (L) 04/03/2020   MCV 95.1 04/03/2020   PLT 316 04/03/2020   Lab Results  Component Value Date   NA 131 (L) 04/03/2020  K 3.5 04/03/2020   CL 104 04/03/2020   CO2 21 (L) 04/03/2020   Lab Results  Component Value Date   ALT 26 04/03/2020   AST 16 04/03/2020   ALKPHOS 219 (H) 04/03/2020   BILITOT 0.4 04/03/2020      RADIOGRAPHY: DG Chest 2 View  Result Date: 04/02/2020 CLINICAL DATA:  45 year old female with tachycardia and low grade fever. EXAM: CHEST - 2 VIEW COMPARISON:  03/10/2018 FINDINGS: The heart size and mediastinal contours are within normal limits. Both lungs are clear. The visualized skeletal structures are unremarkable. IMPRESSION: No acute cardiopulmonary process. Electronically Signed   By: Ruthann Cancer MD   On: 04/02/2020 09:09   CT L-SPINE NO CHARGE  Result Date: 04/02/2020 CLINICAL DATA:  Back pain.  Pathologic fracture with hypercalcemia. EXAM: CT LUMBAR SPINE WITHOUT CONTRAST TECHNIQUE: Multidetector CT imaging of the lumbar spine was performed without intravenous contrast administration. Multiplanar CT image reconstructions were also generated. COMPARISON:  Thoracolumbar spine radiographs 03/07/2020 FINDINGS: Segmentation: 5 lumbar type vertebrae. Alignment: Straightening of the normal lumbar lordosis. No significant listhesis. Vertebrae: Widespread lytic bone lesions throughout the lumbar spine as well as included lower thoracic spine and pelvis. Pathologic T12 compression fracture with  50% vertebral body height loss, progressed from the prior radiographs. New pathologic L4 fracture involving the vertebral body and pedicles with 85% vertebral body height loss centrally and 6 mm retropulsion of the posterior vertebral cortex. Large lesion involving the left pedicle, pars, and transverse process of L1 with possible nondisplaced pathologic fracture. Paraspinal and other soft tissues: No acute paraspinal soft tissue abnormality. Intra-abdominal and pelvic contents reported separately. 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 IMPRESSION: 1. Widespread lytic bone lesions consistent with metastatic disease. 2. Pathologic T12 and L4 vertebral fractures as above. Possible nondisplaced pathologic fracture of the left L1 posterior elements. 3. 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 Electronically Signed   By: Logan Bores M.D.   On: 04/02/2020 12:52   DG Bone Survey Met  Result Date: 04/02/2020 CLINICAL DATA:  Lytic lesion on x-ray.  Possible multiple myeloma. EXAM: METASTATIC BONE SURVEY COMPARISON:  CT of same day. FINDINGS: Ill-defined lucencies are noted throughout the skull which may represent lytic lesions. Lucencies are noted in the bilateral humeri. Lytic lesions are seen involving the right third and fourth ribs. Probable lytic lesion is seen involving the left fourth rib. Lytic lesions are noted in the left scapula. Fractures are seen involving the T12 and L4 vertebral bodies consistent with pathologic fractures. Lucencies are seen throughout the pelvis and visualized proximal femurs consistent with lytic lesions. IMPRESSION: Multiple lytic lesions are noted consistent with multiple myeloma or metastatic disease. Pathologic fractures are seen involving the T12 and L4 vertebral bodies. Electronically Signed   By: Marijo Conception M.D.   On: 04/02/2020 15:29   DG FEMUR PORT MIN 2 VIEWS LEFT  Result Date: 04/02/2020 CLINICAL  DATA:  Bilateral thigh pain.  History of myeloma. EXAM: LEFT FEMUR PORTABLE 2 VIEWS COMPARISON:  Femur exam on bone survey earlier today. Included portion from CT earlier today. FINDINGS: Lucent lesion in the lesser trochanter was better appreciated on CT earlier today. There are multiple small lucencies involving the femoral head and intertrochanteric femur that are not well seen by radiograph. No obvious destructive lesion in the femoral shaft. No evidence of pathologic fracture. IMPRESSION: Proximal femoral lesions including a lesion of the lesser trochanter, majority of these are  better seen on CT earlier today. There is no evidence of acute femur fracture or large destructive lesion. Consider MRI for more detailed assessment. Electronically Signed   By: Keith Rake M.D.   On: 04/02/2020 21:21   DG FEMUR PORT, MIN 2 VIEWS RIGHT  Result Date: 04/02/2020 CLINICAL DATA:  Right thigh pain.  Myeloma. EXAM: RIGHT FEMUR PORTABLE 2 VIEW COMPARISON:  Included portion from bone survey earlier today. Included portion from pelvis CT earlier today. FINDINGS: Lucent lesion in the intertrochanteric femur on CT earlier today is only faintly visualized, this is at risk for pathologic fracture. There is some thinning of the lateral cortex of the greater trochanter. There are multiple additional small lucencies in the femoral head on CT that are not well seen by radiograph. Small lesion noted in the distal lateral aspect of the femur in the region of the physeal scar. No evidence of acute fracture. IMPRESSION: Relatively large lucent lesion in the intertrochanteric femur on CT earlier today is only faintly visualized, this is at risk for pathologic fracture. There are multiple additional lucencies in the femoral head on CT are not well seen by radiograph. There is a small lesion in the distal lateral femoral metaphysis. MRI may be of value for more detailed assessment. Electronically Signed   By: Keith Rake M.D.    On: 04/02/2020 21:23   CT CHEST ABDOMEN PELVIS WO CONTRAST  Result Date: 04/02/2020 CLINICAL DATA:  Thoracic and lumbar spine pain. Pathologic fracture with hypercalcemia. EXAM: CT CHEST, ABDOMEN AND PELVIS WITHOUT CONTRAST TECHNIQUE: Multidetector CT imaging of the chest, abdomen and pelvis was performed following the standard protocol without IV contrast. COMPARISON:  Chest radiographs 04/02/2020. Thoracolumbar spine radiographs 03/07/2020. FINDINGS: CT CHEST FINDINGS Cardiovascular: Normal caliber of the thoracic aorta. Normal heart size. No pericardial effusion. Mediastinum/Nodes: No enlarged axillary, mediastinal, or hilar lymph nodes are identified with hilar assessment limited in the absence of IV contrast. Unremarkable visualized thyroid and esophagus. Lungs/Pleura: No pleural effusion or pneumothorax. Patchy and hazy ground-glass opacities in the left greater than right upper lobes. No parenchymal lung mass. Musculoskeletal: Widespread lytic bone lesions throughout the visualized skeleton including a destructive 5 cm lesion involving the posterior right third rib and a 3 cm destructive lesion involving the posteromedial right sixth rib. Smaller destructive lesions with nondisplaced pathologic fractures of the posterolateral right fourth and posterior left fourth ribs. Large lesion involving much of the T1 vertebral body with disruption of the posterior vertebral body cortex but no gross epidural tumor. Pathologic vertebral body fractures with mild vertebral body height loss at T7 and T8 and moderate height loss at T12. CT ABDOMEN PELVIS FINDINGS Hepatobiliary: No focal liver abnormality is seen. No gallstones, gallbladder wall thickening, or biliary dilatation. Pancreas: Unremarkable. Spleen: Unremarkable. Adrenals/Urinary Tract: Unremarkable adrenal glands. No evidence of renal mass, calculi, or hydronephrosis. Unremarkable bladder. Stomach/Bowel: The stomach is moderately distended by fluid and is  otherwise unremarkable. There is no evidence of bowel obstruction or inflammation. The appendix is unremarkable. Vascular/Lymphatic: Normal caliber of the abdominal aorta. No enlarged lymph nodes. Reproductive: Grossly unremarkable uterus and ovaries. Other: No ascites. Musculoskeletal: Widespread lytic bone lesions throughout the spine and pelvis with the lumbar spine evaluated in detail on the separate dedicated spine CT. IMPRESSION: 1. Widespread lytic lesions throughout the visualized skeleton consistent with metastatic disease or multiple myeloma. No definite primary malignancy identified in the chest, abdomen, or pelvis. 2. Nonspecific pulmonary ground-glass opacities in the left greater than right upper lobes, likely infectious  or inflammatory. Electronically Signed   By: Logan Bores M.D.   On: 04/02/2020 13:12       IMPRESSION/PLAN: 1. Multiple myeloma with widespread bony disease and pain secondary to bony lesions. Dr. Lisbeth Renshaw has reviewed her films and would offer a palliative course of radiation. She is on steroids per Dr. Irene Limbo and we will follow this closely and help taper if he feels that we can help. We discussed the nature of disease and would like her to transfer to [facility] for simulation and urgent radiotherapy as she has some radiographic concerns for epidural extension of her tumor in the spine. We discussed the risks, benefits, short, and long term effects of radiotherapy, as well as the curative intent, and the patient is interested in proceeding. I  discussed the delivery and logistics of radiotherapy and anticipates a course of 2 weeks of radiotherapy to at least L5, possibly in to the left femur as well once Dr. Ida Rogue had a chance to look through in more detail her femural lesions. She will sign written consent to proceed tomorrow for simulation as well as urgent start to treatment. Dr. Irene Limbo is working on whether or not she needs preventative pinning of her femur as well with  orthopedics.  In a visit lasting 90 minutes, greater than 50% of the time was spent by phone and in floor time between providers discussing the patient's condition, in preparation for the discussion, and coordinating the patient's care.   The above documentation reflects my direct findings during this shared patient visit. Please see the separate note by Dr. Lisbeth Renshaw on this date for the remainder of the patient's plan of care.    Carola Rhine, [facility]   **Disclaimer: This note was dictated with voice recognition software. Similar sounding words can inadvertently be transcribed and this note may contain transcription errors which may not have been corrected upon publication of note.**

## 2020-04-03 NOTE — Procedures (Signed)
Interventional Radiology Procedure Note  Procedure: CT guided aspirate and core biopsy of right iliac bone Complications: None Recommendations: - Bedrest supine x 1 hrs - Hydrocodone PRN  Pain - Follow biopsy results  Signed,  Numa Heatwole K. Cristalle Rohm, MD   

## 2020-04-03 NOTE — Progress Notes (Signed)
Patient transferred to [facility] via [place]. Report given to [facility] and Nature conservation officer in [employer]. Vitals taken before transfer and oxycodone 5 mg given. 24 hr. Urine specimen also  Transferred to [facility]. Patient's belonging sent with the patient. Patient's husband aware of the transfer.

## 2020-04-03 NOTE — Consult Note (Signed)
Neurosurgery Consultation  Reason for Consult: Suspected MM w/ neurologic deficit Referring Physician: Renne Crigler  CC: LLE/back pain  HPI: This is a 45 y.o. woman that presents with 3 weeks of acute on subacute low back pain and new LLE pain. She had low back pain that was tolerable until roughly 3 weeks ago, when it became much more severe and began radiating down the LLE into the L foot in roughly an L5 distribution. The pain is sharp in quality, worse with almost any type of movement, improved with laying in bed slightly flexed, with numbness in the same distribution. She had staging scans which showed likely MM and had a bone marrow biopsy this morning. She has had significant difficulty with ambulation x3 weeks, but denies any urinary retention or fecal incontinence.   ROS: A 14 point ROS was performed and is negative except as noted in the HPI.   PMHx:  Past Medical History:  Diagnosis Date  . Back pain    FamHx:  Family History  Problem Relation Age of Onset  . Lung cancer Maternal Grandfather   . Pancreatic cancer Paternal Grandfather    SocHx:  reports that she has been smoking cigarettes. She has a 10.00 pack-year smoking history. She has never used smokeless tobacco. She reports previous alcohol use. She reports previous drug use.  Exam: Vital signs in last 24 hours: Temp:  [97.8 F (36.6 C)-98.7 F (37.1 C)] 98.6 F (37 C) (02/16 0400) Pulse Rate:  [116-134] 119 (02/16 0944) Resp:  [12-21] 17 (02/16 0944) BP: (134-198)/(89-116) 150/91 (02/16 0944) SpO2:  [87 %-99 %] 96 % (02/16 0944) Weight:  [108.9 kg] 108.9 kg (02/15 2230) General: Awake, alert, cooperative, lying in bed in NAD Head: Normocephalic and atruamatic HEENT: Neck supple Pulmonary: breathing room air comfortably, no evidence of increased work of breathing Cardiac: tachycardic, regular Abdomen: obese S NT ND Extremities: Warm and well perfused x4 Neuro: AOx3, PERRL, EOMI, FS Strength 5/5 in BUE, SILT in  BUE w/ no hoffman's, reflexes diffusely very hyporeflexic and difficult to elicit  RLE diffusely 4- to 4/5 with some pain limitation due to pain in the LLE, sensation intact LLE diffusely 3 to 4-/5 with severe pain limitation, numbness most dense in the L L5 and S1 distributions   Assessment and Plan: 45 y.o. woman w/ suspected MM and LBP/LLE radicular pain and LLE weakness without bowel or bladder issues. CT L-spine personally reviewed, which shows diffuse marrow abnormalities with pathologic fracture at L4-5 with some mild retropulsion and what appears to be epidural tumor. T12 fracture without retropulsion.   -given that her weakness has been present for roughly 3 weeks and the suspected underlying pathology. Although not diagnostic, on her CT it appears that the canal stenosis is mostly tumor / soft tissue density and not due to a significant component of retropulsed fracture, which would be radiosensitive and therefore I think RT would be a better treatment modality for this.  -will need an MRI L-spine to better evaluate, would d/w radiation oncology to see what type of scan they want and timing of the scan with regard to recovery of renal function and contrast administration -please call with any concerns or questions  Judith Part, MD 04/03/20 11:56 AM [facility]

## 2020-04-03 NOTE — Progress Notes (Addendum)
PROGRESS NOTE  Stacey Montoya CBJ:[id number] DOB: 03/25/1975 DOA: 04/02/2020 PCP: Celene Squibb, MD   LOS: 1 day   Brief Narrative / Interim history: This is a 45 year old female with history of smoking but no other medical problems, comes to the hospital with severe back pain.  This has been going on and progressively getting worse over the last couple of months.  She is seeing a chiropractor as an outpatient, also saw Dr Kathyrn Sheriff with neurosurgery due to a T12 compression fracture, treated conservatively.  She has been having worsening low back pain as well as left hip pain and eventually could not take it anymore and came to the hospital.  She was found to be hypercalcemic with a calcium of 14.5, and a CT scan showed widespread lytic lesions consistent with metastatic disease or multiple myeloma.  Oncology consulted.  Subjective / 24h Interval events: Comfortable when she gets the pain medications.  No chest pain, no shortness of breath.  Assessment & Plan: Principal Problem Hypercalcemia of malignancy -calcium improving today, continue IV fluids.  She received pamidronate on 2/16.  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 Neurosurgery and Radiation oncology consulted, she will start urgent treatments in am. Will transfer to [place] of the hip on the right side showed large lucent lesion in the intertrochanteric femur, increased risk for pathological fracture.  Discussed with Ortho for prophylactic pinning, they recommend onc ortho evaluation at [facility], I guess this would need to happen after her radiation tx  Acute kidney injury  -possibly prerenal versus myeloma kidney.  Creatinine improving with fluids  Hypertension-placed on metoprolol, she is also tachycardic likely in the setting of malignancy  Hyponatremia-improving, likely due to dehydration  Normocytic anemia-in the setting of malignancy    Scheduled Meds: . calcitonin  400 Units Subcutaneous BID  . dexamethasone  20 mg Intravenous Q breakfast  . heparin  5,000 Units Subcutaneous Q8H  . lidocaine      . nicotine  21 mg Transdermal Daily  . sodium chloride flush  3 mL Intravenous Q12H   Continuous Infusions: . sodium chloride 200 mL/hr at 04/03/20 0400   PRN Meds:.albuterol, fentaNYL (SUBLIMAZE) injection, labetalol, LORazepam, ondansetron **OR** ondansetron (ZOFRAN) IV, oxyCODONE  Diet Orders (From admission, onward)    Start     Ordered   04/02/20 1553  Diet Heart Room service appropriate? Yes; Fluid consistency: Thin  Diet effective now       Question Answer Comment  Room service appropriate? Yes   Fluid consistency: Thin      04/02/20 1552          DVT prophylaxis: heparin injection 5,000 Units Start: 04/02/20 1415     Code Status: Full Code  Family Communication: no family at bedside   Status is: Inpatient  Remains inpatient appropriate because:Inpatient level of care appropriate due to severity of illness   Dispo: The patient is from: Home              Anticipated d/c is to: Home              Anticipated d/c date is: 3 days  Patient currently is not medically stable to d/c.   Difficult to place patient No   Level of care: Progressive  Consultants:  Oncology   Procedures:  None   Microbiology  None   Antimicrobials: None     Objective: Vitals:   04/03/20 0926 04/03/20 0930 04/03/20 0935 04/03/20 0944  BP: (!) 137/93 (!) 161/93 (!) 134/97 (!) 150/91  Pulse: (!) 120 (!) 122 (!) 118 (!) 119  Resp: 17 17 17 17   Temp:      TempSrc:      SpO2: 99% 99% 98% 96%  Weight:      Height:         Intake/Output Summary (Last 24 hours) at 04/03/2020 1049 Last data filed at 04/03/2020 0600 Gross per 24 hour  Intake 1494.69 ml  Output 3700 ml  Net -2205.31 ml   Filed Weights   04/02/20 1600 04/02/20 2230  Weight: 108.9 kg 108.9 kg    Examination:  Constitutional: NAD Eyes: no scleral icterus ENMT: Mucous membranes are moist.  Neck: normal, supple Respiratory: clear to auscultation bilaterally, no wheezing, no crackles. Normal respiratory effort.   Cardiovascular: Regular rate and rhythm, no murmurs / rubs / gallops. No LE edema.  Abdomen: non distended, no tenderness. Bowel sounds positive.  Musculoskeletal: no clubbing / cyanosis.  Skin: no rashes Neurologic: CN 2-12 grossly intact. Strength 5/5 in all 4.  Psychiatric: Normal judgment and insight. Alert and oriented x 3. Normal mood.    Data Reviewed: I have independently reviewed following labs and imaging studies   CBC: Recent Labs  Lab 04/02/20 0843 04/03/20 0432  WBC 14.5* 11.4*  HGB 9.7* 8.3*  HCT 29.5* 25.2*  MCV 96.4 95.1  PLT 387 174   Basic Metabolic Panel: Recent Labs  Lab 04/02/20 0843 04/02/20 1015 04/02/20 1850 04/03/20 0432  NA 125* 124* 127* 131*  K 3.9 3.3* 3.7 3.5  CL 94* 92* 97* 104  CO2 23 24 25  21*  GLUCOSE 101* 105* 98 130*  BUN 53* 53* 49* 41*  CREATININE 3.37* 3.38* 3.02* 2.62*  CALCIUM 14.5* 14.5* 13.4* 12.1*   Liver Function Tests: Recent Labs  Lab 04/02/20 1800 04/03/20 0432  AST 18 16  ALT 27 26  ALKPHOS 218* 219*  BILITOT 0.6 0.4  PROT 9.9* 9.9*  ALBUMIN 2.3* 2.2*   Coagulation Profile: No results for input(s): INR, PROTIME in the last 168 hours. HbA1C: No results for input(s): HGBA1C in the last 72 hours. CBG: No results for input(s): GLUCAP in the last 168 hours.  Recent Results (from the past 240 hour(s))  Resp Panel by RT-PCR (Flu A&B, Covid) Nasopharyngeal Swab     Status: None   Collection Time: 04/02/20 11:12 AM   Specimen: Nasopharyngeal Swab;  Nasopharyngeal(NP) swabs in vial transport medium  Result Value Ref Range Status   SARS Coronavirus 2 by RT PCR NEGATIVE NEGATIVE Final    Comment: (NOTE) SARS-CoV-2 target nucleic acids are NOT DETECTED.  The SARS-CoV-2 RNA is generally detectable in upper respiratory specimens during the acute phase of infection. The lowest concentration of SARS-CoV-2 viral copies this assay can detect is 138 copies/mL. A negative result does not preclude SARS-Cov-2 infection and should not be used as the sole basis for treatment or other patient management decisions. A negative result may occur with  improper specimen collection/handling, submission of specimen other than nasopharyngeal swab, presence of viral mutation(s) within the areas targeted by this assay, and inadequate number of viral copies(<138 copies/mL). A negative result  must be combined with clinical observations, patient history, and epidemiological information. The expected result is Negative.  Fact Sheet for Patients:  EntrepreneurPulse.com.au  Fact Sheet for Healthcare Providers:  IncredibleEmployment.be  This test is no t yet approved or cleared by the [place] FDA and  has been authorized for detection and/or diagnosis of SARS-CoV-2 by FDA under an Emergency Use Authorization (EUA). This EUA will remain  in effect (meaning this test can be used) for the duration of the COVID-19 declaration under Section 564(b)(1) of the Act, 21 U.S.C.section 360bbb-3(b)(1), unless the authorization is terminated  or revoked sooner.       Influenza A by PCR NEGATIVE NEGATIVE Final   Influenza B by PCR NEGATIVE NEGATIVE Final    Comment: (NOTE) The Xpert Xpress SARS-CoV-2/FLU/RSV plus assay is intended as an aid in the diagnosis of influenza from Nasopharyngeal swab specimens and should not be used as a sole basis for treatment. Nasal washings and aspirates are unacceptable for Xpert Xpress  SARS-CoV-2/FLU/RSV testing.  Fact Sheet for Patients: EntrepreneurPulse.com.au  Fact Sheet for Healthcare Providers: IncredibleEmployment.be  This test is not yet approved or cleared by the [place] FDA and has been authorized for detection and/or diagnosis of SARS-CoV-2 by FDA under an Emergency Use Authorization (EUA). This EUA will remain in effect (meaning this test can be used) for the duration of the COVID-19 declaration under Section 564(b)(1) of the Act, 21 U.S.C. section 360bbb-3(b)(1), unless the authorization is terminated or revoked.  Performed at [facility], [place] [address] [place], [place] [address]   MRSA PCR Screening     Status: None   Collection Time: 04/02/20 11:01 PM   Specimen: Nasal Mucosa; Nasopharyngeal  Result Value Ref Range Status   MRSA by PCR NEGATIVE NEGATIVE Final    Comment:        The GeneXpert MRSA Assay (FDA approved for NASAL specimens only), is one component of a comprehensive MRSA colonization surveillance program. It is not intended to diagnose MRSA infection nor to guide or monitor treatment for MRSA infections. Performed at [facility], [place] [address], [place], [place] [address]      Radiology Studies: CT L-SPINE NO CHARGE  Result Date: 04/02/2020 CLINICAL DATA:  Back pain.  Pathologic fracture with hypercalcemia. EXAM: CT LUMBAR SPINE WITHOUT CONTRAST TECHNIQUE: Multidetector CT imaging of the lumbar spine was performed without intravenous contrast administration. Multiplanar CT image reconstructions were also generated. COMPARISON:  Thoracolumbar spine radiographs 03/07/2020 FINDINGS: Segmentation: 5 lumbar type vertebrae. Alignment: Straightening of the normal lumbar lordosis. No significant listhesis. Vertebrae: Widespread lytic bone lesions throughout the lumbar spine as well as included lower thoracic spine and pelvis. Pathologic T12 compression fracture with 50% vertebral  body height loss, progressed from the prior radiographs. New pathologic L4 fracture involving the vertebral body and pedicles with 85% vertebral body height loss centrally and 6 mm retropulsion of the posterior vertebral cortex. Large lesion involving the left pedicle, pars, and transverse process of L1 with possible nondisplaced pathologic fracture. Paraspinal and other soft tissues: No acute paraspinal soft tissue abnormality. Intra-abdominal and pelvic contents reported separately. 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 IMPRESSION: 1. Widespread lytic bone lesions consistent with metastatic disease. 2. Pathologic T12 and L4 vertebral fractures as above. Possible nondisplaced pathologic fracture of the left L1 posterior elements. 3. 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 Electronically Signed   By: Seymour Bars.D.  On: 04/02/2020 12:52   DG Bone Survey Met  Result Date: 04/02/2020 CLINICAL DATA:  Lytic lesion on x-ray.  Possible multiple myeloma. EXAM: METASTATIC BONE SURVEY COMPARISON:  CT of same day. FINDINGS: Ill-defined lucencies are noted throughout the skull which may represent lytic lesions. Lucencies are noted in the bilateral humeri. Lytic lesions are seen involving the right third and fourth ribs. Probable lytic lesion is seen involving the left fourth rib. Lytic lesions are noted in the left scapula. Fractures are seen involving the T12 and L4 vertebral bodies consistent with pathologic fractures. Lucencies are seen throughout the pelvis and visualized proximal femurs consistent with lytic lesions. IMPRESSION: Multiple lytic lesions are noted consistent with multiple myeloma or metastatic disease. Pathologic fractures are seen involving the T12 and L4 vertebral bodies. Electronically Signed   By: Marijo Conception M.D.   On: 04/02/2020 15:29   DG FEMUR PORT MIN 2 VIEWS LEFT  Result Date: 04/02/2020 CLINICAL DATA:   Bilateral thigh pain.  History of myeloma. EXAM: LEFT FEMUR PORTABLE 2 VIEWS COMPARISON:  Femur exam on bone survey earlier today. Included portion from CT earlier today. FINDINGS: Lucent lesion in the lesser trochanter was better appreciated on CT earlier today. There are multiple small lucencies involving the femoral head and intertrochanteric femur that are not well seen by radiograph. No obvious destructive lesion in the femoral shaft. No evidence of pathologic fracture. IMPRESSION: Proximal femoral lesions including a lesion of the lesser trochanter, majority of these are better seen on CT earlier today. There is no evidence of acute femur fracture or large destructive lesion. Consider MRI for more detailed assessment. Electronically Signed   By: Keith Rake M.D.   On: 04/02/2020 21:21   DG FEMUR PORT, MIN 2 VIEWS RIGHT  Result Date: 04/02/2020 CLINICAL DATA:  Right thigh pain.  Myeloma. EXAM: RIGHT FEMUR PORTABLE 2 VIEW COMPARISON:  Included portion from bone survey earlier today. Included portion from pelvis CT earlier today. FINDINGS: Lucent lesion in the intertrochanteric femur on CT earlier today is only faintly visualized, this is at risk for pathologic fracture. There is some thinning of the lateral cortex of the greater trochanter. There are multiple additional small lucencies in the femoral head on CT that are not well seen by radiograph. Small lesion noted in the distal lateral aspect of the femur in the region of the physeal scar. No evidence of acute fracture. IMPRESSION: Relatively large lucent lesion in the intertrochanteric femur on CT earlier today is only faintly visualized, this is at risk for pathologic fracture. There are multiple additional lucencies in the femoral head on CT are not well seen by radiograph. There is a small lesion in the distal lateral femoral metaphysis. MRI may be of value for more detailed assessment. Electronically Signed   By: Keith Rake M.D.   On:  04/02/2020 21:23   CT CHEST ABDOMEN PELVIS WO CONTRAST  Result Date: 04/02/2020 CLINICAL DATA:  Thoracic and lumbar spine pain. Pathologic fracture with hypercalcemia. EXAM: CT CHEST, ABDOMEN AND PELVIS WITHOUT CONTRAST TECHNIQUE: Multidetector CT imaging of the chest, abdomen and pelvis was performed following the standard protocol without IV contrast. COMPARISON:  Chest radiographs 04/02/2020. Thoracolumbar spine radiographs 03/07/2020. FINDINGS: CT CHEST FINDINGS Cardiovascular: Normal caliber of the thoracic aorta. Normal heart size. No pericardial effusion. Mediastinum/Nodes: No enlarged axillary, mediastinal, or hilar lymph nodes are identified with hilar assessment limited in the absence of IV contrast. Unremarkable visualized thyroid and esophagus. Lungs/Pleura: No pleural effusion or pneumothorax. Patchy and  hazy ground-glass opacities in the left greater than right upper lobes. No parenchymal lung mass. Musculoskeletal: Widespread lytic bone lesions throughout the visualized skeleton including a destructive 5 cm lesion involving the posterior right third rib and a 3 cm destructive lesion involving the posteromedial right sixth rib. Smaller destructive lesions with nondisplaced pathologic fractures of the posterolateral right fourth and posterior left fourth ribs. Large lesion involving much of the T1 vertebral body with disruption of the posterior vertebral body cortex but no gross epidural tumor. Pathologic vertebral body fractures with mild vertebral body height loss at T7 and T8 and moderate height loss at T12. CT ABDOMEN PELVIS FINDINGS Hepatobiliary: No focal liver abnormality is seen. No gallstones, gallbladder wall thickening, or biliary dilatation. Pancreas: Unremarkable. Spleen: Unremarkable. Adrenals/Urinary Tract: Unremarkable adrenal glands. No evidence of renal mass, calculi, or hydronephrosis. Unremarkable bladder. Stomach/Bowel: The stomach is moderately distended by fluid and is  otherwise unremarkable. There is no evidence of bowel obstruction or inflammation. The appendix is unremarkable. Vascular/Lymphatic: Normal caliber of the abdominal aorta. No enlarged lymph nodes. Reproductive: Grossly unremarkable uterus and ovaries. Other: No ascites. Musculoskeletal: Widespread lytic bone lesions throughout the spine and pelvis with the lumbar spine evaluated in detail on the separate dedicated spine CT. IMPRESSION: 1. Widespread lytic lesions throughout the visualized skeleton consistent with metastatic disease or multiple myeloma. No definite primary malignancy identified in the chest, abdomen, or pelvis. 2. Nonspecific pulmonary ground-glass opacities in the left greater than right upper lobes, likely infectious or inflammatory. Electronically Signed   By: Logan Bores M.D.   On: 04/02/2020 13:12   Marzetta Board, MD, PhD [facility]  Between 7 am - 7 pm I am available, please contact me via Amion or Securechat  Between 7 pm - 7 am I am not available, please contact night coverage MD/APP via Amion

## 2020-04-04 ENCOUNTER — Inpatient Hospital Stay (HOSPITAL_COMMUNITY): Payer: BC Managed Care – PPO

## 2020-04-04 ENCOUNTER — Other Ambulatory Visit: Payer: Self-pay

## 2020-04-04 ENCOUNTER — Ambulatory Visit
Admit: 2020-04-04 | Discharge: 2020-04-04 | Disposition: A | Discharge status: Home / Self Care | Payer: BC Managed Care – PPO | Attending: Radiation Oncology | Admitting: Radiation Oncology

## 2020-04-04 DIAGNOSIS — K5903 Drug induced constipation: Secondary | ICD-10-CM

## 2020-04-04 DIAGNOSIS — G893 Neoplasm related pain (acute) (chronic): Secondary | ICD-10-CM | POA: Diagnosis not present

## 2020-04-04 DIAGNOSIS — C9000 Multiple myeloma not having achieved remission: Secondary | ICD-10-CM | POA: Diagnosis not present

## 2020-04-04 LAB — COMPREHENSIVE METABOLIC PANEL
ALT: 28 U/L (ref 0–44)
AST: 21 U/L (ref 15–41)
Albumin: 2.3 g/dL — ABNORMAL LOW (ref 3.5–5.0)
Alkaline Phosphatase: 222 U/L — ABNORMAL HIGH (ref 38–126)
Anion gap: 4 — ABNORMAL LOW (ref 5–15)
BUN: 45 mg/dL — ABNORMAL HIGH (ref 6–20)
CO2: 19 mmol/L — ABNORMAL LOW (ref 22–32)
Calcium: 9.9 mg/dL (ref 8.9–10.3)
Chloride: 109 mmol/L (ref 98–111)
Creatinine, Ser: 1.7 mg/dL — ABNORMAL HIGH (ref 0.44–1.00)
GFR, Estimated: 38 mL/min — ABNORMAL LOW (ref >60)
Glucose, Bld: 109 mg/dL — ABNORMAL HIGH (ref 70–99)
Potassium: 3.1 mmol/L — ABNORMAL LOW (ref 3.5–5.1)
Sodium: 132 mmol/L — ABNORMAL LOW (ref 135–145)
Total Bilirubin: 0.6 mg/dL (ref 0.3–1.2)
Total Protein: 9.2 g/dL — ABNORMAL HIGH (ref 6.5–8.1)

## 2020-04-04 LAB — CBC WITH DIFFERENTIAL/PLATELET
Abs Immature Granulocytes: 0.08 10*3/uL — ABNORMAL HIGH (ref 0.00–0.07)
Basophils Absolute: 0 10*3/uL (ref 0.0–0.1)
Basophils Relative: 0 %
Eosinophils Absolute: 0.1 10*3/uL (ref 0.0–0.5)
Eosinophils Relative: 0 %
HCT: 23.7 % — ABNORMAL LOW (ref 36.0–46.0)
Hemoglobin: 7.4 g/dL — ABNORMAL LOW (ref 12.0–15.0)
Immature Granulocytes: 1 %
Lymphocytes Relative: 4 %
Lymphs Abs: 0.6 10*3/uL — ABNORMAL LOW (ref 0.7–4.0)
MCH: 31.6 pg (ref 26.0–34.0)
MCHC: 31.2 g/dL (ref 30.0–36.0)
MCV: 101.3 fL — ABNORMAL HIGH (ref 80.0–100.0)
Monocytes Absolute: 0.7 10*3/uL (ref 0.1–1.0)
Monocytes Relative: 5 %
Neutro Abs: 14.5 10*3/uL — ABNORMAL HIGH (ref 1.7–7.7)
Neutrophils Relative %: 90 %
Platelets: 290 10*3/uL (ref 150–400)
RBC: 2.34 MIL/uL — ABNORMAL LOW (ref 3.87–5.11)
RDW: 15.4 % (ref 11.5–15.5)
WBC: 15.9 10*3/uL — ABNORMAL HIGH (ref 4.0–10.5)
nRBC: 0 % (ref 0.0–0.2)

## 2020-04-04 LAB — PREPARE RBC (CROSSMATCH)

## 2020-04-04 LAB — PROCALCITONIN: Procalcitonin: <0.1 ng/mL

## 2020-04-04 LAB — ABO/RH: ABO/RH(D): A POS

## 2020-04-04 MED ORDER — SODIUM CHLORIDE 0.9% IV SOLUTION: Freq: Once | INTRAVENOUS | Status: AC

## 2020-04-04 MED ORDER — METOPROLOL TARTRATE 25 MG PO TABS
12.5000 mg | ORAL_TABLET | Freq: Once | ORAL | Status: AC
  Administered 2020-04-04: 12.5 mg via ORAL
  Filled 2020-04-04: qty 1

## 2020-04-04 MED ORDER — POTASSIUM CHLORIDE CRYS ER 20 MEQ PO TBCR
40.0000 meq | EXTENDED_RELEASE_TABLET | Freq: Two times a day (BID) | ORAL | Status: AC
  Administered 2020-04-04 – 2020-04-05 (×4): 40 meq via ORAL
  Filled 2020-04-04 (×5): qty 2

## 2020-04-04 MED ORDER — METOPROLOL TARTRATE 25 MG PO TABS
25.0000 mg | ORAL_TABLET | Freq: Two times a day (BID) | ORAL | Status: DC
  Administered 2020-04-04 – 2020-04-05 (×3): 25 mg via ORAL
  Filled 2020-04-04 (×3): qty 1

## 2020-04-04 MED ORDER — POTASSIUM CHLORIDE CRYS ER 20 MEQ PO TBCR
40.0000 meq | EXTENDED_RELEASE_TABLET | Freq: Once | ORAL | Status: AC
  Administered 2020-04-04: 40 meq via ORAL
  Filled 2020-04-04: qty 2

## 2020-04-04 MED ORDER — FUROSEMIDE 10 MG/ML IJ SOLN
40.0000 mg | Freq: Once | INTRAMUSCULAR | Status: AC
  Administered 2020-04-04: 40 mg via INTRAVENOUS
  Filled 2020-04-04: qty 4

## 2020-04-04 NOTE — Progress Notes (Signed)
PROGRESS NOTE  Stacey Montoya:[id number] DOB: [birth year] DOA: 04/02/2020 PCP: Celene Squibb, MD  HPI/Recap of past 24 hours: This is a 45 year old female with history of smoking but no other medical problems, comes to the hospital with severe back pain.  This has been going on and progressively getting worse over the last couple of months.  She is seeing a chiropractor as an outpatient, also saw Dr Kathyrn Sheriff with neurosurgery due to a T12 compression fracture, treated conservatively.  She has been having worsening low back pain as well as left hip pain and eventually could not take it anymore and came to the hospital.  She was found to be hypercalcemic with a calcium of 14.5, and a CT scan showed widespread lytic lesions consistent with metastatic disease or multiple myeloma.  Oncology consulted.  She received pamidronate on 04/03/2020 for hypercalcemia of malignancy and aggressive IV fluid with improvement.  She underwent a bone marrow biopsy on 04/03/2020 by IR, Dr. Chevis Pretty.  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  Transferred to [facility] to start urgent radiation therapy.  First radiation therapy planned for 04/04/2020, 4PM.  04/04/20: Seen and examined with her husband at bedside.  She denies any headache at this time.  Bowel movement last night.  Hypercalcemia is much improved post treatment.    Assessment/Plan: Principal Problem:   Hypercalcemia of malignancy Active Problems:   Lytic bone lesions on xray   Hypertensive urgency   Normocytic anemia   Acute renal failure (ARF) (HCC)   Hyponatremia   Multiple myeloma (HCC)  Severe hypercalcemia of malignancy Presented with calcium 14.5 and widespread lytic lesions consistent with metastatic disease or multiple myeloma. She received pamidronate on 04/03/2020 as well as aggressive IV fluid  hydration. Hypercalcemia is much improved. Reduced rate of normal saline 100 cc/h from 200 cc/h. 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 Neurosurgery and Radiation oncology consulted, she will start urgent treatments in am. Will transfer to [place] of the hip on the right side showed large lucent lesion in the intertrochanteric femur, increased risk for pathological fracture.  Discussed with Ortho for prophylactic pinning, they recommend onc ortho evaluation at [facility], I guess this would need to happen after her radiation tx Appreciate medical oncology, radiation oncology and neurosurgery's assistance.  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 Creatinine downtrending, 1.70 with GFR of 38. Continue to avoid nephrotoxic agents Continue to monitor urine output Last urine output recorded 1.5 L in the last 24 hours.  Essential hypertension BP is not at goal. Increased dose of metoprolol 25 mg twice daily. Continue to monitor vital signs.  Hypovolemic hyponatremia, likely secondary to dehydration from hypercalcemia Reported polyuria and polydipsia prior to admission. Improved with IV fluid hydration, normal saline, continue.  Normal anion gap metabolic acidosis likely secondary to renal insufficiency. Serum bicarb 19, anion gap 4 Continue IV fluid hydration Repeat BMP in the morning.  Isolated elevated alkaline phosphatase, suspect related to malignancy AST ALT, T bilirubin normal  Continue to monitor  Hypokalemia Serum potassium 3.1 Repleted Recheck  in the morning  Leukocytosis, suspect reactive in the setting of steroid use Nonseptic appearing UA 04/02/2020 negative for pyuria No acute cardiopulmonary disease on 04/02/2020.    Code Status: Full code.  Family Communication: Updated husband at bedside.  Disposition Plan: Likely will discharge to home once medical oncology, radiation oncology and neurosurgery sign off.   Consultants:  Medical oncology  Radiation oncology  Neurosurgery.  Procedures:  Radiation planned on 04/04/2020.  Antimicrobials:  None.  DVT prophylaxis: Subcu heparin 3 times daily.  Status is: Inpatient   Dispo:  Patient From: Home  Planned Disposition: Home  Expected discharge date: 04/07/2020  Medically stable for discharge: No, ongoing management of newly diagnosed lytic lesions and presumed stage IV malignancy.          Objective: Vitals:   04/03/20 2326 04/04/20 0353 04/04/20 0810 04/04/20 1200  BP: (!) 163/104 (!) 151/95 (!) 155/95 (!) 161/90  Pulse: (!) 109 100 98 100  Resp: _0 Temp: 99.1 F (37.3 C) 98.4 F (36.9 C) 98.3 F (36.8 C) 98.3 F (36.8 C)  TempSrc: Oral Oral Oral Oral  SpO2: 94% 96% 91% 94%  Weight: 106.9 kg     Height:        Intake/Output Summary (Last 24 hours) at 04/04/2020 1532 Last data filed at 04/04/2020 1446 Gross per 24 hour  Intake 2724.72 ml  Output 3000 ml  Net -275.28 ml   Filed Weights   04/02/20 1600 04/02/20 2230 04/03/20 2326  Weight: 108.9 kg 108.9 kg 106.9 kg    Exam:   General: 45 y.o. year-old female well developed well nourished in no acute distress.  Alert and oriented x3.  Cardiovascular: Regular rate and rhythm with no rubs or gallops.  No thyromegaly or JVD noted.    Respiratory: Clear to auscultation with no wheezes or rales. Good inspiratory effort.  Abdomen: Soft nontender nondistended with normal bowel sounds x4 quadrants.  Musculoskeletal: No lower extremity edema. 2/4 pulses in all 4  extremities.  Skin: No ulcerative lesions noted or rashes,  Psychiatry: Mood is appropriate for condition and setting   Data Reviewed: CBC: Recent Labs  Lab 04/02/20 0843 04/03/20 0432 04/04/20 0958  WBC 14.5* 11.4* 15.9*  NEUTROABS  --   --  14.5*  HGB 9.7* 8.3* 7.4*  HCT 29.5* 25.2* 23.7*  MCV 96.4 95.1 101.3*  PLT 387 316 676   Basic Metabolic Panel: Recent Labs  Lab 04/02/20 0843 04/02/20 1015 04/02/20 1850 04/03/20 0432 04/04/20 0958  NA 125* 124* 127* 131* 132*  K 3.9 3.3* 3.7 3.5 3.1*  CL 94* 92* 97* 104 109  CO2 _1 21* 19*  GLUCOSE 101* 105* 98 130* 109*  BUN 53* 53* 49* 41* 45*  CREATININE 3.37* 3.38* 3.02* 2.62* 1.70*  CALCIUM 14.5* 14.5* 13.4* 12.1* 9.9   GFR: Estimated Creatinine Clearance: 51.5 mL/min (A) (by C-G formula based on SCr of 1.7 mg/dL (H)). Liver Function Tests: Recent Labs  Lab 04/02/20 1800 04/03/20 0432 04/04/20 0958  AST _2 ALT _3 ALKPHOS 218* 219* 222*  BILITOT 0.6 0.4 0.6  PROT 9.9* 9.9* 9.2*  ALBUMIN 2.3* 2.2* 2.3*   No results for input(s): LIPASE, AMYLASE in the last 168 hours. No results for input(s): AMMONIA in the last 168 hours. Coagulation Profile: No results for input(s): INR, PROTIME in the last 168 hours. Cardiac Enzymes: No results for  input(s): CKTOTAL, CKMB, CKMBINDEX, TROPONINI in the last 168 hours. BNP (last 3 results) No results for input(s): PROBNP in the last 8760 hours. HbA1C: No results for input(s): HGBA1C in the last 72 hours. CBG: No results for input(s): GLUCAP in the last 168 hours. Lipid Profile: No results for input(s): CHOL, HDL, LDLCALC, TRIG, CHOLHDL, LDLDIRECT in the last 72 hours. Thyroid Function Tests: Recent Labs    04/02/20 1112  TSH 2.004   Anemia Panel: No results for input(s): VITAMINB12, FOLATE, FERRITIN, TIBC, IRON, RETICCTPCT in the last 72 hours. Urine analysis:    Component Value Date/Time   COLORURINE YELLOW 04/02/2020 [facility] 04/02/2020 1227   LABSPEC 1.011 04/02/2020 1227   PHURINE 5.0 04/02/2020 1227   GLUCOSEU NEGATIVE 04/02/2020 1227   HGBUR LARGE (A) 04/02/2020 1227   BILIRUBINUR NEGATIVE 04/02/2020 [facility] 04/02/2020 1227   PROTEINUR 30 (A) 04/02/2020 1227   NITRITE NEGATIVE 04/02/2020 1227   LEUKOCYTESUR NEGATIVE 04/02/2020 1227   Sepsis Labs: _0 (procalcitonin:4,lacticidven:4)  ) Recent Results (from the past 240 hour(s))  Resp Panel by RT-PCR (Flu A&B, Covid) Nasopharyngeal Swab     Status: None   Collection Time: 04/02/20 11:12 AM   Specimen: Nasopharyngeal Swab; Nasopharyngeal(NP) swabs in vial transport medium  Result Value Ref Range Status   SARS Coronavirus 2 by RT PCR NEGATIVE NEGATIVE Final    Comment: (NOTE) SARS-CoV-2 target nucleic acids are NOT DETECTED.  The SARS-CoV-2 RNA is generally detectable in upper respiratory specimens during the acute phase of infection. The lowest concentration of SARS-CoV-2 viral copies this assay can detect is 138 copies/mL. A negative result does not preclude SARS-Cov-2 infection and should not be used as the sole basis for treatment or other patient management decisions. A negative result may occur with  improper specimen collection/handling, submission of specimen other than nasopharyngeal swab, presence of viral mutation(s) within the areas targeted by this assay, and inadequate number of viral copies(<138 copies/mL). A negative result must be combined with clinical observations, patient history, and epidemiological information. The expected result is Negative.  Fact Sheet for Patients:  EntrepreneurPulse.com.au  Fact Sheet for Healthcare Providers:  IncredibleEmployment.be  This test is no t yet approved or cleared by the [place] FDA and  has been authorized for detection and/or diagnosis of SARS-CoV-2 by FDA under an Emergency Use Authorization (EUA). This EUA will  remain  in effect (meaning this test can be used) for the duration of the COVID-19 declaration under Section 564(b)(1) of the Act, 21 U.S.C.section 360bbb-3(b)(1), unless the authorization is terminated  or revoked sooner.       Influenza A by PCR NEGATIVE NEGATIVE Final   Influenza B by PCR NEGATIVE NEGATIVE Final    Comment: (NOTE) The Xpert Xpress SARS-CoV-2/FLU/RSV plus assay is intended as an aid in the diagnosis of influenza from Nasopharyngeal swab specimens and should not be used as a sole basis for treatment. Nasal washings and aspirates are unacceptable for Xpert Xpress SARS-CoV-2/FLU/RSV testing.  Fact Sheet for Patients: EntrepreneurPulse.com.au  Fact Sheet for Healthcare Providers: IncredibleEmployment.be  This test is not yet approved or cleared by the [place] FDA and has been authorized for detection and/or diagnosis of SARS-CoV-2 by FDA under an Emergency Use Authorization (EUA). This EUA will remain in effect (meaning this test can be used) for the duration of the COVID-19 declaration under Section 564(b)(1) of the Act, 21 U.S.C. section 360bbb-3(b)(1), unless the authorization is terminated or revoked.  Performed at [facility]  [facility], [place] [address], [place], [place] [address]   MRSA PCR Screening     Status: None   Collection Time: 04/02/20 11:01 PM   Specimen: Nasal Mucosa; Nasopharyngeal  Result Value Ref Range Status   MRSA by PCR NEGATIVE NEGATIVE Final    Comment:        The GeneXpert MRSA Assay (FDA approved for NASAL specimens only), is one component of a comprehensive MRSA colonization surveillance program. It is not intended to diagnose MRSA infection nor to guide or monitor treatment for MRSA infections. Performed at [facility], [place] [address] [place],  [address]       Studies: No results found.  Scheduled Meds:  sodium chloride   Intravenous Once   cholecalciferol   2,000 Units Oral Daily   dexamethasone  20 mg Intravenous Q breakfast   fentaNYL  1 patch Transdermal Q72H   gabapentin  200 mg Oral BID   heparin  5,000 Units Subcutaneous Q8H   metoprolol tartrate  12.5 mg Oral BID   nicotine  21 mg Transdermal Daily   polyethylene glycol  17 g Oral Daily   potassium chloride  40 mEq Oral BID   senna-docusate  2 tablet Oral BID   sodium chloride flush  3 mL Intravenous Q12H    Continuous Infusions:  sodium chloride 100 mL/hr at 04/04/20 1449     LOS: 2 days     Kayleen Memos, MD [facility] Pager [phone]  If 7PM-7AM, please contact night-coverage [email or web address] Password [facility] 04/04/2020, 3:32 PM

## 2020-04-04 NOTE — Addendum Note (Signed)
Encounter addended by: Hayden Pedro, PA-C on: 04/04/2020 11:16 AM  Actions taken: Level of Service modified

## 2020-04-04 NOTE — Significant Event (Signed)
Rapid Response Event Note   Reason for Call :  Pt returned from radiation, SOB requiring increased oxygen  Initial Focused Assessment:  Pt resting in bed, increased WOB, Respirations 30s, face and upper chest red. Sats mid to upper 90s with 6L [place] and breathing treatment being given. Pt states pressure and chest and feels like she cannot get a deep breath in. Lungs clear upon auscultation in bilat upper lobes, and diminished in bilat lower lobes. Pt had just returned from first round of radiation and states the difficulty breathing started while laying flat and having to put hands over head for radiation.     Interventions:  CXR ordered EKG obtained, PRN anxiety medication administered. Pt respiratory status improved   Plan of Care:  Pt remains in 1611. MD notified of patient status. CXR pending. MD to follow. CXR resulted- lasix given, IV fluids stopped   Event Summary:   MD Notified: Venita Sheffield Call Time: [place] Time: Halfway  Wray Kearns, RN

## 2020-04-04 NOTE — Progress Notes (Signed)
Patient returned from radiation treatment at 1715 c/o SOB. Oxygen saturation low 80's, Oxygen applied at 6 L to bring O2 saturation to mid 90's. Breathing treatment given and Rapid response RN called for assistance. Pt stated feeling of pressure in chest, EKG was completed, MD was notified by Rivka Spring RN and CXR was ordered. Ativan was given for anxiety d/t SOB.  Lasix given as ordered after CXR resulted. Patient stated that she was feeling less SOB. Will continue to monitor.

## 2020-04-04 NOTE — Progress Notes (Signed)
[facility]   HEMATOLOGY/ONCOLOGY INPATIENT PROGRESS NOTE  Date of Service: 04/04/2019  Inpatient Attending: .Kayleen Memos, DO   SUBJECTIVE  Ms. Stacey Montoya was seen in follow-up for likely multiple myeloma.  Her calcium levels are improving significantly. Notes that she is feeling much better than yesterday but still having significant discomfort in her lower back with radicular pain radiating into her left thigh and leg. Improving p.o. intake but has not had a bowel movement in more than 10 days.  We discussed laxative options and laxatives were ordered.  We discussed optimization of pain management with addition of her fentanyl patch and adding Neurontin for her radicular pain and she is agreeable to this. Tolerating the high-dose dexamethasone well. Seen by radiation oncology and will be moving to [facility] for palliative radiation to L4-5 and possibly other areas in her femurs. Orthopedics did not have any specific input. Case was discussed with hospital medicine and radiation oncology. Neurosurgery does not plan to pursue any back surgeries at this time. Discussed with patient about likely treatment options. She had a bone marrow biopsy and results are currently pending.     OBJECTIVE:  NAD  PHYSICAL EXAMINATION: . Vitals:   04/03/20 2326 04/04/20 0353 04/04/20 0810 04/04/20 1200  BP: (!) 163/104 (!) 151/95 (!) 155/95 (!) 161/90  Pulse: (!) 109 100 98 100  Resp: _0 Temp: 99.1 F (37.3 C) 98.4 F (36.9 C) 98.3 F (36.8 C) 98.3 F (36.8 C)  TempSrc: Oral Oral Oral Oral  SpO2: 94% 96% 91% 94%  Weight: 235 lb 10.8 oz (106.9 kg)     Height:       Filed Weights   04/02/20 1600 04/02/20 2230 04/03/20 2326  Weight: 240 lb (108.9 kg) 240 lb (108.9 kg) 235 lb 10.8 oz (106.9 kg)   .Body mass index is 40.45 kg/m.  GENERAL:alert, in no acute distress and comfortable SKIN: skin color, texture, turgor are normal, no rashes or significant lesions EYES: normal, conjunctiva  are pink and non-injected, sclera clear OROPHARYNX:no exudate, no erythema and lips, buccal mucosa, and tongue normal  NECK: supple, no JVD, thyroid normal size, non-tender, without nodularity LYMPH:  no palpable lymphadenopathy in the cervical, axillary or inguinal LUNGS: clear to auscultation with normal respiratory effort HEART: regular rate & rhythm,  no murmurs and no lower extremity edema ABDOMEN: abdomen soft, non-tender, normoactive bowel sounds  Musculoskeletal: no cyanosis of digits and no clubbing  PSYCH: alert & oriented x 3 with fluent speech NEURO: no focal motor/sensory deficits  MEDICAL HISTORY:  Past Medical History:  Diagnosis Date  . Back pain     SURGICAL HISTORY: History reviewed. No pertinent surgical history.  SOCIAL HISTORY: Social History   Socioeconomic History  . Marital status: Married    Spouse name: Not on file  . Number of children: Not on file  . Years of education: Not on file  . Highest education level: Not on file  Occupational History  . Not on file  Tobacco Use  . Smoking status: Current Every Day Smoker    Packs/day: 0.50    Years: 20.00    Pack years: 10.00    Types: Cigarettes  . Smokeless tobacco: Never Used  Vaping Use  . Vaping Use: Every day  Substance and Sexual Activity  . Alcohol use: Not Currently  . Drug use: Not Currently  . Sexual activity: Not on file  Other Topics Concern  . Not on file  Social History Narrative  .  Not on file   Social Determinants of Health   Financial Resource Strain: Not on file  Food Insecurity: Not on file  Transportation Needs: Not on file  Physical Activity: Not on file  Stress: Not on file  Social Connections: Not on file  Intimate Partner Violence: Not on file    FAMILY HISTORY: Family History  Problem Relation Age of Onset  . Lung cancer Maternal Grandfather   . Pancreatic cancer Paternal Grandfather     ALLERGIES:  is allergic to azithromycin.  MEDICATIONS:  Scheduled  Meds: . sodium chloride   Intravenous Once  . cholecalciferol  2,000 Units Oral Daily  . dexamethasone  20 mg Intravenous Q breakfast  . fentaNYL  1 patch Transdermal Q72H  . gabapentin  200 mg Oral BID  . heparin  5,000 Units Subcutaneous Q8H  . metoprolol tartrate  12.5 mg Oral BID  . nicotine  21 mg Transdermal Daily  . polyethylene glycol  17 g Oral Daily  . potassium chloride  40 mEq Oral BID  . senna-docusate  2 tablet Oral BID  . sodium chloride flush  3 mL Intravenous Q12H   Continuous Infusions: . sodium chloride 200 mL/hr at 04/04/20 1015   PRN Meds:.albuterol, fentaNYL (SUBLIMAZE) injection, labetalol, LORazepam, ondansetron **OR** ondansetron (ZOFRAN) IV, oxyCODONE  REVIEW OF SYSTEMS:    10 Point review of Systems was done is negative except as noted above.   LABORATORY DATA:  I have reviewed the data as listed  . CBC Latest Ref Rng & Units 04/03/2020 04/02/2020  WBC 4.0 - 10.5 K/uL 11.4(H) 14.5(H)  Hemoglobin 12.0 - 15.0 g/dL 8.3(L) 9.7(L)  Hematocrit 36.0 - 46.0 % 25.2(L) 29.5(L)  Platelets 150 - 400 K/uL 316 387    . CMP Latest Ref Rng & Units 04/03/2020 04/02/2020  Glucose 70 - 99 mg/dL 130(H) 98  BUN 6 - 20 mg/dL 41(H) 49(H)  Creatinine 0.44 - 1.00 mg/dL 2.62(H) 3.02(H)  Sodium 135 - 145 mmol/L 131(L) 127(L)  Potassium 3.5 - 5.1 mmol/L 3.5 3.7  Chloride 98 - 111 mmol/L 104 97(L)  CO2 22 - 32 mmol/L 21(L) 25  Calcium 8.9 - 10.3 mg/dL 12.1(H) 13.4(HH)  Total Protein 6.5 - 8.1 g/dL 9.9(H) -  Total Bilirubin 0.3 - 1.2 mg/dL 0.4 -  Alkaline Phos 38 - 126 U/L 219(H) -  AST 15 - 41 U/L 16 -  ALT 0 - 44 U/L 26 -     RADIOGRAPHIC STUDIES: I have personally reviewed the radiological images as listed and agreed with the findings in the report. DG Chest 2 View  Result Date: 04/02/2020 CLINICAL DATA:  45 year old female with tachycardia and low grade fever. EXAM: CHEST - 2 VIEW COMPARISON:  03/10/2018 FINDINGS: The heart size and mediastinal contours are  within normal limits. Both lungs are clear. The visualized skeletal structures are unremarkable. IMPRESSION: No acute cardiopulmonary process. Electronically Signed   By: Ruthann Cancer MD   On: 04/02/2020 09:09   CT L-SPINE NO CHARGE  Result Date: 04/02/2020 CLINICAL DATA:  Back pain.  Pathologic fracture with hypercalcemia. EXAM: CT LUMBAR SPINE WITHOUT CONTRAST TECHNIQUE: Multidetector CT imaging of the lumbar spine was performed without intravenous contrast administration. Multiplanar CT image reconstructions were also generated. COMPARISON:  Thoracolumbar spine radiographs 03/07/2020 FINDINGS: Segmentation: 5 lumbar type vertebrae. Alignment: Straightening of the normal lumbar lordosis. No significant listhesis. Vertebrae: Widespread lytic bone lesions throughout the lumbar spine as well as included lower thoracic spine and pelvis. Pathologic T12 compression fracture with 50%  vertebral body height loss, progressed from the prior radiographs. New pathologic L4 fracture involving the vertebral body and pedicles with 85% vertebral body height loss centrally and 6 mm retropulsion of the posterior vertebral cortex. Large lesion involving the left pedicle, pars, and transverse process of L1 with possible nondisplaced pathologic fracture. Paraspinal and other soft tissues: No acute paraspinal soft tissue abnormality. Intra-abdominal and pelvic contents reported separately. 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 IMPRESSION: 1. Widespread lytic bone lesions consistent with metastatic disease. 2. Pathologic T12 and L4 vertebral fractures as above. Possible nondisplaced pathologic fracture of the left L1 posterior elements. 3. 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 Electronically Signed   By: Logan Bores M.D.   On: 04/02/2020 12:52   DG Bone Survey Met  Result Date: 04/02/2020 CLINICAL DATA:  Lytic lesion on x-ray.  Possible multiple myeloma.  EXAM: METASTATIC BONE SURVEY COMPARISON:  CT of same day. FINDINGS: Ill-defined lucencies are noted throughout the skull which may represent lytic lesions. Lucencies are noted in the bilateral humeri. Lytic lesions are seen involving the right third and fourth ribs. Probable lytic lesion is seen involving the left fourth rib. Lytic lesions are noted in the left scapula. Fractures are seen involving the T12 and L4 vertebral bodies consistent with pathologic fractures. Lucencies are seen throughout the pelvis and visualized proximal femurs consistent with lytic lesions. IMPRESSION: Multiple lytic lesions are noted consistent with multiple myeloma or metastatic disease. Pathologic fractures are seen involving the T12 and L4 vertebral bodies. Electronically Signed   By: Marijo Conception M.D.   On: 04/02/2020 15:29   CT BONE MARROW BIOPSY & ASPIRATION  Result Date: 04/03/2020 INDICATION: 45 year old female with newly diagnosed multiple myeloma. She presents for CT-guided bone marrow biopsy to assess for marrow involvement. EXAM: CT GUIDED BONE MARROW ASPIRATION AND CORE BIOPSY Interventional Radiologist:  Criselda Peaches, MD MEDICATIONS: None. ANESTHESIA/SEDATION: Moderate (conscious) sedation was employed during this procedure. A total of 4 milligrams versed and 150 micrograms fentanyl were administered intravenously. The patient's level of consciousness and vital signs were monitored continuously by radiology nursing throughout the procedure under my direct supervision. Total monitored sedation time: 18 minutes FLUOROSCOPY TIME:  None COMPLICATIONS: None immediate. Estimated blood loss: <25 mL PROCEDURE: Informed written consent was obtained from the patient after a thorough discussion of the procedural risks, benefits and alternatives. All questions were addressed. Maximal Sterile Barrier Technique was utilized including caps, mask, sterile gowns, sterile gloves, sterile drape, hand hygiene and skin antiseptic.  A timeout was performed prior to the initiation of the procedure. The patient was positioned prone and non-contrast localization CT was performed of the pelvis to demonstrate the iliac marrow spaces. Maximal barrier sterile technique utilized including caps, mask, sterile gowns, sterile gloves, large sterile drape, hand hygiene, and betadine prep. Under sterile conditions and local anesthesia, an 11 gauge coaxial bone biopsy needle was advanced into the right iliac marrow space. Needle position was confirmed with CT imaging. Initially, bone marrow aspiration was performed. Next, the 11 gauge outer cannula was utilized to obtain a right iliac bone marrow core biopsy. Needle was removed. Hemostasis was obtained with compression. The patient tolerated the procedure well. Samples were prepared with the cytotechnologist. IMPRESSION: Technically successful CT-guided bone marrow aspiration and core biopsy of the right iliac bone. Electronically Signed   By: Jacqulynn Cadet M.D.   On: 04/03/2020 19:33   DG FEMUR PORT MIN 2 VIEWS  LEFT  Result Date: 04/02/2020 CLINICAL DATA:  Bilateral thigh pain.  History of myeloma. EXAM: LEFT FEMUR PORTABLE 2 VIEWS COMPARISON:  Femur exam on bone survey earlier today. Included portion from CT earlier today. FINDINGS: Lucent lesion in the lesser trochanter was better appreciated on CT earlier today. There are multiple small lucencies involving the femoral head and intertrochanteric femur that are not well seen by radiograph. No obvious destructive lesion in the femoral shaft. No evidence of pathologic fracture. IMPRESSION: Proximal femoral lesions including a lesion of the lesser trochanter, majority of these are better seen on CT earlier today. There is no evidence of acute femur fracture or large destructive lesion. Consider MRI for more detailed assessment. Electronically Signed   By: Keith Rake M.D.   On: 04/02/2020 21:21   DG FEMUR PORT, MIN 2 VIEWS RIGHT  Result Date:  04/02/2020 CLINICAL DATA:  Right thigh pain.  Myeloma. EXAM: RIGHT FEMUR PORTABLE 2 VIEW COMPARISON:  Included portion from bone survey earlier today. Included portion from pelvis CT earlier today. FINDINGS: Lucent lesion in the intertrochanteric femur on CT earlier today is only faintly visualized, this is at risk for pathologic fracture. There is some thinning of the lateral cortex of the greater trochanter. There are multiple additional small lucencies in the femoral head on CT that are not well seen by radiograph. Small lesion noted in the distal lateral aspect of the femur in the region of the physeal scar. No evidence of acute fracture. IMPRESSION: Relatively large lucent lesion in the intertrochanteric femur on CT earlier today is only faintly visualized, this is at risk for pathologic fracture. There are multiple additional lucencies in the femoral head on CT are not well seen by radiograph. There is a small lesion in the distal lateral femoral metaphysis. MRI may be of value for more detailed assessment. Electronically Signed   By: Keith Rake M.D.   On: 04/02/2020 21:23   CT CHEST ABDOMEN PELVIS WO CONTRAST  Result Date: 04/02/2020 CLINICAL DATA:  Thoracic and lumbar spine pain. Pathologic fracture with hypercalcemia. EXAM: CT CHEST, ABDOMEN AND PELVIS WITHOUT CONTRAST TECHNIQUE: Multidetector CT imaging of the chest, abdomen and pelvis was performed following the standard protocol without IV contrast. COMPARISON:  Chest radiographs 04/02/2020. Thoracolumbar spine radiographs 03/07/2020. FINDINGS: CT CHEST FINDINGS Cardiovascular: Normal caliber of the thoracic aorta. Normal heart size. No pericardial effusion. Mediastinum/Nodes: No enlarged axillary, mediastinal, or hilar lymph nodes are identified with hilar assessment limited in the absence of IV contrast. Unremarkable visualized thyroid and esophagus. Lungs/Pleura: No pleural effusion or pneumothorax. Patchy and hazy ground-glass opacities in  the left greater than right upper lobes. No parenchymal lung mass. Musculoskeletal: Widespread lytic bone lesions throughout the visualized skeleton including a destructive 5 cm lesion involving the posterior right third rib and a 3 cm destructive lesion involving the posteromedial right sixth rib. Smaller destructive lesions with nondisplaced pathologic fractures of the posterolateral right fourth and posterior left fourth ribs. Large lesion involving much of the T1 vertebral body with disruption of the posterior vertebral body cortex but no gross epidural tumor. Pathologic vertebral body fractures with mild vertebral body height loss at T7 and T8 and moderate height loss at T12. CT ABDOMEN PELVIS FINDINGS Hepatobiliary: No focal liver abnormality is seen. No gallstones, gallbladder wall thickening, or biliary dilatation. Pancreas: Unremarkable. Spleen: Unremarkable. Adrenals/Urinary Tract: Unremarkable adrenal glands. No evidence of renal mass, calculi, or hydronephrosis. Unremarkable bladder. Stomach/Bowel: The stomach is moderately distended by fluid and is otherwise unremarkable. There  is no evidence of bowel obstruction or inflammation. The appendix is unremarkable. Vascular/Lymphatic: Normal caliber of the abdominal aorta. No enlarged lymph nodes. Reproductive: Grossly unremarkable uterus and ovaries. Other: No ascites. Musculoskeletal: Widespread lytic bone lesions throughout the spine and pelvis with the lumbar spine evaluated in detail on the separate dedicated spine CT. IMPRESSION: 1. Widespread lytic lesions throughout the visualized skeleton consistent with metastatic disease or multiple myeloma. No definite primary malignancy identified in the chest, abdomen, or pelvis. 2. Nonspecific pulmonary ground-glass opacities in the left greater than right upper lobes, likely infectious or inflammatory. Electronically Signed   By: Logan Bores M.D.   On: 04/02/2020 13:12    ASSESSMENT & PLAN:   45 year old  very pleasant lady with  1) likely newly diagnosed multiple myeloma with extensive bone lesions 2) hypercalcemia due to multiple myeloma improving with IV fluids calcitonin and bisphosphonates 3) anemia due to multiple myeloma becoming more apparent as the patient's hemoconcentration due to dehydration has been corrected 4) acute renal failure related to dehydration hypercalcemia and multiple myeloma renal function is improving with IV fluids and improving calcium levels 5) multilevel pathologic fractures in the spine most symptomatic at L4-5 with some epidural tumor and left lower extremity radicular pain 6) severe constipation related to chronic constipation plus hypercalcemia plus opiates 7) hyponatremia related to dehydration improving with IV fluids PLAN -Continue IV fluids as per hospital medicine team -Added fentanyl patch at 12 mcg/h with continued oxycodone as needed -Started Neurontin for left lower extremity radicular pain will optimize dose as tolerated. -Magnesium citrate x1 followed by daily MiraLAX and senna 2 tablets twice daily for constipation -Would hold calcitonin after calcium corrects -Started on vitamin D 2000 units daily for vitamin D deficiency -Radiation oncology has been consulted and discussed with radiation oncology patient she will be moving to [facility] for palliative radiation to symptomatic disease -Neurosurgery input appreciated -Appreciate excellent hospital medicine care. -We will complete 4 days of high-dose dexamethasone as ordered.  We will likely plan to treat the patient with CyBorD once final diagnosis is confirmed on labs and bone marrow biopsy. -Pending myeloma panel light chains and bone marrow biopsy results. -We will likely need outpatient PET CT scan -Oncology will continue to follow closely  I spent 40 minutes counseling the patient face to face. The total time spent in the appointment was 60 minutes and more than 50% was on  counseling and direct patient cares.    Sullivan Lone MD Parks AAHIVMS [facility] Hematology/Oncology Physician [facility]  (Office):       [phone] (Work cell):  [phone] (Fax):           [phone]

## 2020-04-04 NOTE — Progress Notes (Addendum)
[facility]   HEMATOLOGY/ONCOLOGY INPATIENT PROGRESS NOTE  Date of Service: 04/04/2019  Inpatient Attending: .Kayleen Memos, DO   SUBJECTIVE  Stacey Montoya was seen in follow-up for likely multiple myeloma.  Calcium level has normalized and renal function continues to improve. She reports the pain is much better controlled today.  Radicular pain has resolved at this point. She underwent simulation in preparation for radiation.  First radiation treatment later today. Bowels moved yesterday. She continues to tolerate high-dose dexamethasone well overall. Bone marrow biopsy results are pending. Her LDH was normal, beta-2 microglobulin elevated at 11.1, kappa free light chain and lambda free light chains were normal with mildly elevated kappa, lambda light chain ratio of 1.88.  Her multiple myeloma panel, protein electrophoresis, and UPEP are all still pending.  OBJECTIVE:  NAD  PHYSICAL EXAMINATION: . Vitals:   04/03/20 2326 04/04/20 0353 04/04/20 0810 04/04/20 1200  BP: (!) 163/104 (!) 151/95 (!) 155/95 (!) 161/90  Pulse: (!) 109 100 98 100  Resp: 20 20 18 18   Temp: 99.1 F (37.3 C) 98.4 F (36.9 C) 98.3 F (36.8 C) 98.3 F (36.8 C)  TempSrc: Oral Oral Oral Oral  SpO2: 94% 96% 91% 94%  Weight: 106.9 kg     Height:       Filed Weights   04/02/20 1600 04/02/20 2230 04/03/20 2326  Weight: 108.9 kg 108.9 kg 106.9 kg   .Body mass index is 40.45 kg/m.  GENERAL:alert, in no acute distress and comfortable SKIN: skin color, texture, turgor are normal, no rashes or significant lesions EYES: normal, conjunctiva are pink and non-injected, sclera clear OROPHARYNX:no exudate, no erythema and lips, buccal mucosa, and tongue normal  NECK: supple, no JVD, thyroid normal size, non-tender, without nodularity LYMPH:  no palpable lymphadenopathy in the cervical, axillary or inguinal LUNGS: clear to auscultation with normal respiratory effort HEART: regular rate & rhythm,  no murmurs and no lower  extremity edema ABDOMEN: abdomen soft, non-tender, normoactive bowel sounds  Musculoskeletal: no cyanosis of digits and no clubbing  PSYCH: alert & oriented x 3 with fluent speech NEURO: no focal motor/sensory deficits  MEDICAL HISTORY:  Past Medical History:  Diagnosis Date   Back pain     SURGICAL HISTORY: History reviewed. No pertinent surgical history.  SOCIAL HISTORY: Social History   Socioeconomic History   Marital status: Married    Spouse name: Not on file   Number of children: Not on file   Years of education: Not on file   Highest education level: Not on file  Occupational History   Not on file  Tobacco Use   Smoking status: Current Every Day Smoker    Packs/day: 0.50    Years: 20.00    Pack years: 10.00    Types: Cigarettes   Smokeless tobacco: Never Used  Scientific laboratory technician Use: Every day  Substance and Sexual Activity   Alcohol use: Not Currently   Drug use: Not Currently   Sexual activity: Not on file  Other Topics Concern   Not on file  Social History Narrative   Not on file   Social Determinants of Health   Financial Resource Strain: Not on file  Food Insecurity: Not on file  Transportation Needs: Not on file  Physical Activity: Not on file  Stress: Not on file  Social Connections: Not on file  Intimate Partner Violence: Not on file    FAMILY HISTORY: Family History  Problem Relation Age of Onset   Lung cancer Maternal Grandfather  Pancreatic cancer Paternal Grandfather     ALLERGIES:  is allergic to azithromycin.  MEDICATIONS:  Scheduled Meds:  sodium chloride   Intravenous Once   cholecalciferol  2,000 Units Oral Daily   dexamethasone  20 mg Intravenous Q breakfast   fentaNYL  1 patch Transdermal Q72H   gabapentin  200 mg Oral BID   heparin  5,000 Units Subcutaneous Q8H   metoprolol tartrate  12.5 mg Oral Once   metoprolol tartrate  25 mg Oral BID   nicotine  21 mg Transdermal Daily    polyethylene glycol  17 g Oral Daily   potassium chloride  40 mEq Oral BID   senna-docusate  2 tablet Oral BID   sodium chloride flush  3 mL Intravenous Q12H   Continuous Infusions:  sodium chloride 100 mL/hr at 04/04/20 1449   PRN Meds:.albuterol, fentaNYL (SUBLIMAZE) injection, labetalol, LORazepam, ondansetron **OR** ondansetron (ZOFRAN) IV, oxyCODONE  REVIEW OF SYSTEMS:    10 Point review of Systems was done is negative except as noted above.   LABORATORY DATA:  I have reviewed the data as listed  CBC    Component Value Date/Time   WBC 15.9 (H) 04/04/2020 0958   RBC 2.34 (L) 04/04/2020 0958   HGB 7.4 (L) 04/04/2020 0958   HCT 23.7 (L) 04/04/2020 0958   PLT 290 04/04/2020 0958   MCV 101.3 (H) 04/04/2020 0958   MCH 31.6 04/04/2020 0958   MCHC 31.2 04/04/2020 0958   RDW 15.4 04/04/2020 0958   LYMPHSABS 0.6 (L) 04/04/2020 0958   MONOABS 0.7 04/04/2020 0958   EOSABS 0.1 04/04/2020 0958   BASOSABS 0.0 04/04/2020 0958    CMP Latest Ref Rng & Units 04/04/2020 04/03/2020 04/02/2020  Glucose 70 - 99 mg/dL 109(H) 130(H) 98  BUN 6 - 20 mg/dL 45(H) 41(H) 49(H)  Creatinine 0.44 - 1.00 mg/dL 1.70(H) 2.62(H) 3.02(H)  Sodium 135 - 145 mmol/L 132(L) 131(L) 127(L)  Potassium 3.5 - 5.1 mmol/L 3.1(L) 3.5 3.7  Chloride 98 - 111 mmol/L 109 104 97(L)  CO2 22 - 32 mmol/L 19(L) 21(L) 25  Calcium 8.9 - 10.3 mg/dL 9.9 12.1(H) 13.4(HH)  Total Protein 6.5 - 8.1 g/dL 9.2(H) 9.9(H) -  Total Bilirubin 0.3 - 1.2 mg/dL 0.6 0.4 -  Alkaline Phos 38 - 126 U/L 222(H) 219(H) -  AST 15 - 41 U/L 21 16 -  ALT 0 - 44 U/L 28 26 -    RADIOGRAPHIC STUDIES: I have personally reviewed the radiological images as listed and agreed with the findings in the report. DG Chest 2 View  Result Date: 04/02/2020 CLINICAL DATA:  45 year old female with tachycardia and low grade fever. EXAM: CHEST - 2 VIEW COMPARISON:  03/10/2018 FINDINGS: The heart size and mediastinal contours are within normal limits. Both lungs  are clear. The visualized skeletal structures are unremarkable. IMPRESSION: No acute cardiopulmonary process. Electronically Signed   By: Ruthann Cancer MD   On: 04/02/2020 09:09   CT L-SPINE NO CHARGE  Result Date: 04/02/2020 CLINICAL DATA:  Back pain.  Pathologic fracture with hypercalcemia. EXAM: CT LUMBAR SPINE WITHOUT CONTRAST TECHNIQUE: Multidetector CT imaging of the lumbar spine was performed without intravenous contrast administration. Multiplanar CT image reconstructions were also generated. COMPARISON:  Thoracolumbar spine radiographs 03/07/2020 FINDINGS: Segmentation: 5 lumbar type vertebrae. Alignment: Straightening of the normal lumbar lordosis. No significant listhesis. Vertebrae: Widespread lytic bone lesions throughout the lumbar spine as well as included lower thoracic spine and pelvis. Pathologic T12 compression fracture with 50% vertebral body height loss,  progressed from the prior radiographs. New pathologic L4 fracture involving the vertebral body and pedicles with 85% vertebral body height loss centrally and 6 mm retropulsion of the posterior vertebral cortex. Large lesion involving the left pedicle, pars, and transverse process of L1 with possible nondisplaced pathologic fracture. Paraspinal and other soft tissues: No acute paraspinal soft tissue abnormality. Intra-abdominal and pelvic contents reported separately. 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 IMPRESSION: 1. Widespread lytic bone lesions consistent with metastatic disease. 2. Pathologic T12 and L4 vertebral fractures as above. Possible nondisplaced pathologic fracture of the left L1 posterior elements. 3. 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 Electronically Signed   By: Logan Bores M.D.   On: 04/02/2020 12:52   DG Bone Survey Met  Result Date: 04/02/2020 CLINICAL DATA:  Lytic lesion on x-ray.  Possible multiple myeloma. EXAM: METASTATIC BONE SURVEY  COMPARISON:  CT of same day. FINDINGS: Ill-defined lucencies are noted throughout the skull which may represent lytic lesions. Lucencies are noted in the bilateral humeri. Lytic lesions are seen involving the right third and fourth ribs. Probable lytic lesion is seen involving the left fourth rib. Lytic lesions are noted in the left scapula. Fractures are seen involving the T12 and L4 vertebral bodies consistent with pathologic fractures. Lucencies are seen throughout the pelvis and visualized proximal femurs consistent with lytic lesions. IMPRESSION: Multiple lytic lesions are noted consistent with multiple myeloma or metastatic disease. Pathologic fractures are seen involving the T12 and L4 vertebral bodies. Electronically Signed   By: Marijo Conception M.D.   On: 04/02/2020 15:29   CT BONE MARROW BIOPSY & ASPIRATION  Result Date: 04/03/2020 INDICATION: 45 year old female with newly diagnosed multiple myeloma. She presents for CT-guided bone marrow biopsy to assess for marrow involvement. EXAM: CT GUIDED BONE MARROW ASPIRATION AND CORE BIOPSY Interventional Radiologist:  Criselda Peaches, MD MEDICATIONS: None. ANESTHESIA/SEDATION: Moderate (conscious) sedation was employed during this procedure. A total of 4 milligrams versed and 150 micrograms fentanyl were administered intravenously. The patient's level of consciousness and vital signs were monitored continuously by radiology nursing throughout the procedure under my direct supervision. Total monitored sedation time: 18 minutes FLUOROSCOPY TIME:  None COMPLICATIONS: None immediate. Estimated blood loss: <25 mL PROCEDURE: Informed written consent was obtained from the patient after a thorough discussion of the procedural risks, benefits and alternatives. All questions were addressed. Maximal Sterile Barrier Technique was utilized including caps, mask, sterile gowns, sterile gloves, sterile drape, hand hygiene and skin antiseptic. A timeout was performed  prior to the initiation of the procedure. The patient was positioned prone and non-contrast localization CT was performed of the pelvis to demonstrate the iliac marrow spaces. Maximal barrier sterile technique utilized including caps, mask, sterile gowns, sterile gloves, large sterile drape, hand hygiene, and betadine prep. Under sterile conditions and local anesthesia, an 11 gauge coaxial bone biopsy needle was advanced into the right iliac marrow space. Needle position was confirmed with CT imaging. Initially, bone marrow aspiration was performed. Next, the 11 gauge outer cannula was utilized to obtain a right iliac bone marrow core biopsy. Needle was removed. Hemostasis was obtained with compression. The patient tolerated the procedure well. Samples were prepared with the cytotechnologist. IMPRESSION: Technically successful CT-guided bone marrow aspiration and core biopsy of the right iliac bone. Electronically Signed   By: Jacqulynn Cadet M.D.   On: 04/03/2020 19:33   DG FEMUR PORT MIN 2 VIEWS LEFT  Result Date:  04/02/2020 CLINICAL DATA:  Bilateral thigh pain.  History of myeloma. EXAM: LEFT FEMUR PORTABLE 2 VIEWS COMPARISON:  Femur exam on bone survey earlier today. Included portion from CT earlier today. FINDINGS: Lucent lesion in the lesser trochanter was better appreciated on CT earlier today. There are multiple small lucencies involving the femoral head and intertrochanteric femur that are not well seen by radiograph. No obvious destructive lesion in the femoral shaft. No evidence of pathologic fracture. IMPRESSION: Proximal femoral lesions including a lesion of the lesser trochanter, majority of these are better seen on CT earlier today. There is no evidence of acute femur fracture or large destructive lesion. Consider MRI for more detailed assessment. Electronically Signed   By: Keith Rake M.D.   On: 04/02/2020 21:21   DG FEMUR PORT, MIN 2 VIEWS RIGHT  Result Date: 04/02/2020 CLINICAL  DATA:  Right thigh pain.  Myeloma. EXAM: RIGHT FEMUR PORTABLE 2 VIEW COMPARISON:  Included portion from bone survey earlier today. Included portion from pelvis CT earlier today. FINDINGS: Lucent lesion in the intertrochanteric femur on CT earlier today is only faintly visualized, this is at risk for pathologic fracture. There is some thinning of the lateral cortex of the greater trochanter. There are multiple additional small lucencies in the femoral head on CT that are not well seen by radiograph. Small lesion noted in the distal lateral aspect of the femur in the region of the physeal scar. No evidence of acute fracture. IMPRESSION: Relatively large lucent lesion in the intertrochanteric femur on CT earlier today is only faintly visualized, this is at risk for pathologic fracture. There are multiple additional lucencies in the femoral head on CT are not well seen by radiograph. There is a small lesion in the distal lateral femoral metaphysis. MRI may be of value for more detailed assessment. Electronically Signed   By: Keith Rake M.D.   On: 04/02/2020 21:23   CT CHEST ABDOMEN PELVIS WO CONTRAST  Result Date: 04/02/2020 CLINICAL DATA:  Thoracic and lumbar spine pain. Pathologic fracture with hypercalcemia. EXAM: CT CHEST, ABDOMEN AND PELVIS WITHOUT CONTRAST TECHNIQUE: Multidetector CT imaging of the chest, abdomen and pelvis was performed following the standard protocol without IV contrast. COMPARISON:  Chest radiographs 04/02/2020. Thoracolumbar spine radiographs 03/07/2020. FINDINGS: CT CHEST FINDINGS Cardiovascular: Normal caliber of the thoracic aorta. Normal heart size. No pericardial effusion. Mediastinum/Nodes: No enlarged axillary, mediastinal, or hilar lymph nodes are identified with hilar assessment limited in the absence of IV contrast. Unremarkable visualized thyroid and esophagus. Lungs/Pleura: No pleural effusion or pneumothorax. Patchy and hazy ground-glass opacities in the left greater  than right upper lobes. No parenchymal lung mass. Musculoskeletal: Widespread lytic bone lesions throughout the visualized skeleton including a destructive 5 cm lesion involving the posterior right third rib and a 3 cm destructive lesion involving the posteromedial right sixth rib. Smaller destructive lesions with nondisplaced pathologic fractures of the posterolateral right fourth and posterior left fourth ribs. Large lesion involving much of the T1 vertebral body with disruption of the posterior vertebral body cortex but no gross epidural tumor. Pathologic vertebral body fractures with mild vertebral body height loss at T7 and T8 and moderate height loss at T12. CT ABDOMEN PELVIS FINDINGS Hepatobiliary: No focal liver abnormality is seen. No gallstones, gallbladder wall thickening, or biliary dilatation. Pancreas: Unremarkable. Spleen: Unremarkable. Adrenals/Urinary Tract: Unremarkable adrenal glands. No evidence of renal mass, calculi, or hydronephrosis. Unremarkable bladder. Stomach/Bowel: The stomach is moderately distended by fluid and is otherwise unremarkable. There is no evidence of  bowel obstruction or inflammation. The appendix is unremarkable. Vascular/Lymphatic: Normal caliber of the abdominal aorta. No enlarged lymph nodes. Reproductive: Grossly unremarkable uterus and ovaries. Other: No ascites. Musculoskeletal: Widespread lytic bone lesions throughout the spine and pelvis with the lumbar spine evaluated in detail on the separate dedicated spine CT. IMPRESSION: 1. Widespread lytic lesions throughout the visualized skeleton consistent with metastatic disease or multiple myeloma. No definite primary malignancy identified in the chest, abdomen, or pelvis. 2. Nonspecific pulmonary ground-glass opacities in the left greater than right upper lobes, likely infectious or inflammatory. Electronically Signed   By: Logan Bores M.D.   On: 04/02/2020 13:12    ASSESSMENT & PLAN:   45 year old very pleasant  lady with  1) likely newly diagnosed multiple myeloma with extensive bone lesions 2) hypercalcemia due to multiple myeloma-now resolved with IV fluids, calcitonin, pamidronate. 3) anemia due to multiple myeloma becoming more apparent as the patient's hemoconcentration due to dehydration has been corrected.   4) acute renal failure related to dehydration hypercalcemia and multiple myeloma.  Renal function is improving with IV fluids and improving calcium levels 5) multilevel pathologic fractures in the spine most symptomatic at L4-5 with some epidural tumor and left lower extremity radicular pain 6) severe constipation related to chronic constipation plus hypercalcemia plus opiates 7) hyponatremia related to dehydration improving with IV fluids  PLAN -Continue IV fluids as per hospital medicine team -Continue fentanyl patch at 12 mcg/h with continued oxycodone as needed -Continue Neurontin for left lower extremity radicular pain will optimize dose as tolerated. -Continue current bowel regimen. -Started on vitamin D 2000 units daily for vitamin D deficiency -Transfuse 1 unit PRBCs today.  Risk/benefits of PRBC transfusion have been discussed with the patient husband and she agrees to proceed. -Radiation oncology is seeing the patient.  Simulation completed this morning first treatment plan for later today. -Neurosurgery input appreciated -Appreciate excellent hospital medicine care. -We will complete 4 days of high-dose dexamethasone as ordered.  We will likely plan to treat the patient with CyBorD once final diagnosis is confirmed on labs and bone marrow biopsy. -Pending myeloma panel bone marrow biopsy results. -We will likely need outpatient PET CT scan -Oncology will continue to follow closely  Mikey Bussing, DNP, AGPCNP-BC, AOCNP   ADDENDUM  .Patient was Personally and independently interviewed, examined and relevant elements of the history of present illness were reviewed in  details and an assessment and plan was created. All elements of the patient's history of present illness , assessment and plan were discussed in details with Mikey Bussing, DNP, AGPCNP-BC, AOCNP. The above documentation reflects our combined findings assessment and plan.  Sullivan Lone MD MS

## 2020-04-05 ENCOUNTER — Other Ambulatory Visit: Payer: Self-pay | Admitting: Hematology

## 2020-04-05 ENCOUNTER — Ambulatory Visit
Admit: 2020-04-05 | Discharge: 2020-04-05 | Disposition: A | Discharge status: Home / Self Care | Payer: BC Managed Care – PPO | Attending: Radiation Oncology | Admitting: Radiation Oncology

## 2020-04-05 DIAGNOSIS — C9000 Multiple myeloma not having achieved remission: Secondary | ICD-10-CM

## 2020-04-05 DIAGNOSIS — G893 Neoplasm related pain (acute) (chronic): Secondary | ICD-10-CM | POA: Diagnosis not present

## 2020-04-05 DIAGNOSIS — Z7189 Other specified counseling: Secondary | ICD-10-CM

## 2020-04-05 LAB — PROTEIN ELECTROPHORESIS, SERUM
A/G Ratio: 0.5 — ABNORMAL LOW (ref 0.7–1.7)
Albumin ELP: 3.3 g/dL (ref 2.9–4.4)
Alpha-1-Globulin: 0.4 g/dL (ref 0.0–0.4)
Alpha-2-Globulin: 1 g/dL (ref 0.4–1.0)
Beta Globulin: 1 g/dL (ref 0.7–1.3)
Gamma Globulin: 4.3 g/dL — ABNORMAL HIGH (ref 0.4–1.8)
Globulin, Total: 6.7 g/dL — ABNORMAL HIGH (ref 2.2–3.9)
M-Spike, %: 4.1 g/dL — ABNORMAL HIGH
Total Protein ELP: 10 g/dL — ABNORMAL HIGH (ref 6.0–8.5)

## 2020-04-05 LAB — COMPREHENSIVE METABOLIC PANEL
ALT: 26 U/L (ref 0–44)
AST: 17 U/L (ref 15–41)
Albumin: 2.6 g/dL — ABNORMAL LOW (ref 3.5–5.0)
Alkaline Phosphatase: 240 U/L — ABNORMAL HIGH (ref 38–126)
Anion gap: 6 (ref 5–15)
BUN: 50 mg/dL — ABNORMAL HIGH (ref 6–20)
CO2: 22 mmol/L (ref 22–32)
Calcium: 10.1 mg/dL (ref 8.9–10.3)
Chloride: 109 mmol/L (ref 98–111)
Creatinine, Ser: 1.58 mg/dL — ABNORMAL HIGH (ref 0.44–1.00)
GFR, Estimated: 42 mL/min — ABNORMAL LOW (ref >60)
Glucose, Bld: 99 mg/dL (ref 70–99)
Potassium: 3.5 mmol/L (ref 3.5–5.1)
Sodium: 137 mmol/L (ref 135–145)
Total Bilirubin: 0.5 mg/dL (ref 0.3–1.2)
Total Protein: 9.1 g/dL — ABNORMAL HIGH (ref 6.5–8.1)

## 2020-04-05 LAB — MULTIPLE MYELOMA PANEL, SERUM
Albumin SerPl Elph-Mcnc: 3.1 g/dL (ref 2.9–4.4)
Albumin/Glob SerPl: 0.5 — ABNORMAL LOW (ref 0.7–1.7)
Alpha 1: 0.5 g/dL — ABNORMAL HIGH (ref 0.0–0.4)
Alpha2 Glob SerPl Elph-Mcnc: 1 g/dL (ref 0.4–1.0)
B-Globulin SerPl Elph-Mcnc: 1.1 g/dL (ref 0.7–1.3)
Gamma Glob SerPl Elph-Mcnc: 4.3 g/dL — ABNORMAL HIGH (ref 0.4–1.8)
Globulin, Total: 6.9 g/dL — ABNORMAL HIGH (ref 2.2–3.9)
IgA: 38 mg/dL — ABNORMAL LOW (ref 87–352)
IgG (Immunoglobin G), Serum: 5749 mg/dL — ABNORMAL HIGH (ref 586–1602)
IgM (Immunoglobulin M), Srm: 9 mg/dL — ABNORMAL LOW (ref 26–217)
M Protein SerPl Elph-Mcnc: 4.1 g/dL — ABNORMAL HIGH
Total Protein ELP: 10 g/dL — ABNORMAL HIGH (ref 6.0–8.5)

## 2020-04-05 LAB — TYPE AND SCREEN
ABO/RH(D): A POS
Antibody Screen: NEGATIVE
Unit division: 0

## 2020-04-05 LAB — BPAM RBC
Blood Product Expiration Date: 202203092359
ISSUE DATE / TIME: 202202172227
Unit Type and Rh: 6200

## 2020-04-05 LAB — CBC
HCT: 27.4 % — ABNORMAL LOW (ref 36.0–46.0)
Hemoglobin: 8.9 g/dL — ABNORMAL LOW (ref 12.0–15.0)
MCH: 31.6 pg (ref 26.0–34.0)
MCHC: 32.5 g/dL (ref 30.0–36.0)
MCV: 97.2 fL (ref 80.0–100.0)
Platelets: 344 10*3/uL (ref 150–400)
RBC: 2.82 MIL/uL — ABNORMAL LOW (ref 3.87–5.11)
RDW: 15.6 % — ABNORMAL HIGH (ref 11.5–15.5)
WBC: 14.8 10*3/uL — ABNORMAL HIGH (ref 4.0–10.5)
nRBC: 0 % (ref 0.0–0.2)

## 2020-04-05 LAB — SURGICAL PATHOLOGY

## 2020-04-05 LAB — D-DIMER, QUANTITATIVE: D-Dimer, Quant: 6.39 ug/mL-FEU — ABNORMAL HIGH (ref 0.00–0.50)

## 2020-04-05 LAB — TROPONIN I (HIGH SENSITIVITY)
Troponin I (High Sensitivity): 52 ng/L — ABNORMAL HIGH (ref <18)
Troponin I (High Sensitivity): 49 ng/L — ABNORMAL HIGH (ref <18)
Troponin I (High Sensitivity): 36 ng/L — ABNORMAL HIGH (ref <18)

## 2020-04-05 LAB — PHOSPHORUS: Phosphorus: 2.6 mg/dL (ref 2.5–4.6)

## 2020-04-05 LAB — MAGNESIUM: Magnesium: 1.7 mg/dL (ref 1.7–2.4)

## 2020-04-05 MED ORDER — ONDANSETRON HCL 8 MG PO TABS: 8.0000 mg | ORAL_TABLET | Freq: Two times a day (BID) | ORAL | 1 refills | Status: DC | PRN

## 2020-04-05 MED ORDER — MAGNESIUM OXIDE 400 (241.3 MG) MG PO TABS
800.0000 mg | ORAL_TABLET | Freq: Every day | ORAL | Status: AC
  Administered 2020-04-05 – 2020-04-06 (×2): 800 mg via ORAL
  Filled 2020-04-05 (×2): qty 2

## 2020-04-05 MED ORDER — ACYCLOVIR 400 MG PO TABS: 400.0000 mg | ORAL_TABLET | Freq: Two times a day (BID) | ORAL | 3 refills | Status: DC

## 2020-04-05 MED ORDER — POTASSIUM CHLORIDE CRYS ER 20 MEQ PO TBCR
40.0000 meq | EXTENDED_RELEASE_TABLET | Freq: Once | ORAL | Status: AC
  Administered 2020-04-05: 40 meq via ORAL
  Filled 2020-04-05: qty 2

## 2020-04-05 MED ORDER — PROCHLORPERAZINE MALEATE 10 MG PO TABS: 10.0000 mg | ORAL_TABLET | Freq: Four times a day (QID) | ORAL | 1 refills | Status: DC | PRN

## 2020-04-05 MED ORDER — LORAZEPAM 0.5 MG PO TABS: 0.5000 mg | ORAL_TABLET | Freq: Four times a day (QID) | ORAL | 0 refills | Status: DC | PRN

## 2020-04-05 MED ORDER — FUROSEMIDE 10 MG/ML IJ SOLN
40.0000 mg | Freq: Once | INTRAMUSCULAR | Status: AC
  Administered 2020-04-05: 40 mg via INTRAVENOUS
  Filled 2020-04-05: qty 4

## 2020-04-05 MED ORDER — DEXAMETHASONE 4 MG PO TABS: ORAL_TABLET | ORAL | 3 refills | Status: DC

## 2020-04-05 MED ORDER — GABAPENTIN 300 MG PO CAPS
300.0000 mg | ORAL_CAPSULE | Freq: Two times a day (BID) | ORAL | Status: DC
  Administered 2020-04-05 – 2020-04-08 (×7): 300 mg via ORAL
  Filled 2020-04-05 (×6): qty 1

## 2020-04-05 NOTE — Progress Notes (Addendum)
PROGRESS NOTE  ALAZAE CRYMES FWY:[id number] DOB: Feb 01, 1976 DOA: 04/02/2020 PCP: Celene Squibb, MD  HPI/Recap of past 24 hours: This is a 45 year old female with history of smoking but no other medical problems, comes to the hospital with severe back pain.  This has been going on and progressively getting worse over the last couple of months.  She is seeing a chiropractor as an outpatient, also saw Dr Kathyrn Sheriff with neurosurgery due to a T12 compression fracture, treated conservatively.  She has been having worsening low back pain as well as left hip pain and eventually could not take it anymore and came to the hospital.  She was found to be hypercalcemic with a calcium of 14.5, and a CT scan showed widespread lytic lesions consistent with metastatic disease or multiple myeloma.  Oncology consulted.  She received pamidronate on 04/03/2020 for hypercalcemia of malignancy and aggressive IV fluid with improvement.  She underwent a bone marrow biopsy on 04/03/2020 by IR, Dr. Chevis Pretty.  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  Transferred to [facility] to start urgent radiation therapy.  First radiation therapy planned for 04/04/2020, 4PM.  04/05/20: She was seen and examined at bedside this morning.  Her husband was not present.  She reports pain in her chest, worse when she takes a deep breath.    Assessment/Plan: Principal Problem:   Hypercalcemia of malignancy Active Problems:   Lytic bone lesions on xray   Hypertensive urgency   Normocytic anemia   Acute renal failure (ARF) (HCC)   Hyponatremia   Multiple myeloma (HCC)   Drug-induced constipation   Neoplasm related pain  Severe hypercalcemia of malignancy Presented with calcium 14.5 and widespread lytic lesions consistent with metastatic disease or multiple myeloma. She received pamidronate on 04/03/2020 as  well as aggressive IV fluid hydration. Hypercalcemia is much improved. IV fluid was stopped on 04/04/2020 due to dyspnea, acute hypoxemia, and pulmonary edema seen on chest x-ray. 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 Neurosurgery and Radiation oncology consulted Started first round of radiation on 04/04/2020.  -Imaging of the hip on the right side showed large lucent lesion in the intertrochanteric femur, increased risk for pathological fracture.  Ortho recommend onc ortho evaluation at [facility] onc[facility], radiation oncology and neurosurgery's assistance.  Acute hypoxic respiratory failure secondary to acute pulmonary edema Likely from IV fluid Personally reviewed chest x-ray done on 04/04/2020 which shows increasing pulmonary vascularity suggestive of pulmonary edema. Held off IV fluid on 02/01/2021 evening Received 2 doses of IV Lasix on 04/04/2020 and 04/05/2020 Continue to closely monitor Maintain O2 saturation greater than 92%.  Elevated troponin suspect demand ischemia in the setting of sinus tachycardia Obtain twelve-lead EKG Cycle troponin x2. Obtain 2D echo.  Elevated D-dimer in the setting of pleuritic pain Acute hypoxia appears to be resolving Continue to closely monitor and repeat D-dimer in the morning.  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 Creatinine downtrending, 1.70 with GFR of 38. Continue to avoid nephrotoxic agents  Continue to monitor urine output Last urine output recorded 1.5 L in the last 24  hours.  Essential hypertension BP is not at goal. Increased dose of metoprolol 25 mg twice daily. Continue to monitor vital signs.  Resolved hypovolemic hyponatremia, likely secondary to dehydration from hypercalcemia Serum sodium 137.  Resolved normal anion gap metabolic acidosis likely secondary to renal insufficiency. Received IV fluid hydration.  Worsening isolated elevated alkaline phosphatase, suspect related to malignancy AST ALT, T bilirubin normal Continue to monitor  Resolved post repletion: Hypokalemia Serum potassium 3.1>> 3.5.  Improving leukocytosis, suspect reactive in the setting of steroid use WBC is downtrending.  Not on antibiotics. Nonseptic appearing UA 04/02/2020 negative for pyuria No acute cardiopulmonary disease on 04/02/2020.    Code Status: Full code.  Family Communication: Updated husband at bedside on 04/04/2020.  Disposition Plan: Likely will discharge to home once medical oncology, radiation oncology and neurosurgery sign off.   Consultants:  Medical oncology  Radiation oncology  Neurosurgery.  Procedures:  Radiation planned on 04/04/2020.  Antimicrobials:  None.  DVT prophylaxis: Subcu heparin 3 times daily.  Status is: Inpatient   Dispo:  Patient From: Home  Planned Disposition: Home  Expected discharge date: 04/07/2020  Medically stable for discharge: No, ongoing management of newly diagnosed lytic lesions and presumed stage IV malignancy.          Objective: Vitals:   04/05/20 0141 04/05/20 0218 04/05/20 0611 04/05/20 1043  BP: (!) 182/100 (!) 165/99 (!) 181/109 (!) 163/94  Pulse: (!) 102 96 (!) 104 (!) 110  Resp: 18  18   Temp: 97.6 F (36.4 C)  98.1 F (36.7 C)   TempSrc: Oral  Oral   SpO2: 97%  98% 90%  Weight:      Height:        Intake/Output Summary (Last 24 hours) at 04/05/2020 1553 Last data filed at 04/05/2020 1138 Gross per 24 hour  Intake 489.83 ml  Output 4200 ml  Net -3710.17 ml   Filed  Weights   04/02/20 1600 04/02/20 2230 04/03/20 2326  Weight: 108.9 kg 108.9 kg 106.9 kg    Exam:  . General: 45 y.o. year-old female well developed well nourished in no acute distress.  Alert and oriented times 3. . Cardiovascular: Tachycardic with no rubs or gallops. Marland Kitchen Respiratory: Mild rales at bases.  No wheezing noted.  Poor inspiratory effort. . Abdomen: Soft nontender no bowel sounds present.   . Musculoskeletal: Trace lower Extremity edema bilaterally. . Skin: No ulcerative lesions noted. Marland Kitchen Psychiatry: Mood is appropriate for condition setting.   Data Reviewed: CBC: Recent Labs  Lab 04/02/20 0843 04/03/20 0432 04/04/20 0958 04/05/20 0503  WBC 14.5* 11.4* 15.9* 14.8*  NEUTROABS  --   --  14.5*  --   HGB 9.7* 8.3* 7.4* 8.9*  HCT 29.5* 25.2* 23.7* 27.4*  MCV 96.4 95.1 101.3* 97.2  PLT 387 316 290 852   Basic Metabolic Panel: Recent Labs  Lab 04/02/20 1015 04/02/20 1850 04/03/20 0432 04/04/20 0958 04/05/20 0503  NA 124* 127* 131* 132* 137  K 3.3* 3.7 3.5 3.1* 3.5  CL 92* 97* 104 109 109  CO2 24 25 21* 19* 22  GLUCOSE 105* 98 130* 109* 99  BUN 53* 49* 41* 45* 50*  CREATININE 3.38* 3.02* 2.62* 1.70* 1.58*  CALCIUM 14.5* 13.4* 12.1* 9.9 10.1  MG  --   --   --   --  1.7  PHOS  --   --   --   --  2.6   GFR: Estimated Creatinine Clearance: 55.4 mL/min (A) (by C-G formula based on SCr of 1.58 mg/dL (H)). Liver Function Tests: Recent Labs  Lab 04/02/20 1800 04/03/20 0432 04/04/20 0958 04/05/20 0503  AST _0 ALT _1 ALKPHOS 218* 219* 222* 240*  BILITOT 0.6 0.4 0.6 0.5  PROT 9.9* 9.9* 9.2* 9.1*  ALBUMIN 2.3* 2.2* 2.3* 2.6*   No results for input(s): LIPASE, AMYLASE in the last 168 hours. No results for input(s): AMMONIA in the last 168 hours. Coagulation Profile: No results for input(s): INR, PROTIME in the last 168 hours. Cardiac Enzymes: No results for input(s): CKTOTAL, CKMB, CKMBINDEX, TROPONINI in the last 168 hours. BNP (last 3  results) No results for input(s): PROBNP in the last 8760 hours. HbA1C: No results for input(s): HGBA1C in the last 72 hours. CBG: No results for input(s): GLUCAP in the last 168 hours. Lipid Profile: No results for input(s): CHOL, HDL, LDLCALC, TRIG, CHOLHDL, LDLDIRECT in the last 72 hours. Thyroid Function Tests: No results for input(s): TSH, T4TOTAL, FREET4, T3FREE, THYROIDAB in the last 72 hours. Anemia Panel: No results for input(s): VITAMINB12, FOLATE, FERRITIN, TIBC, IRON, RETICCTPCT in the last 72 hours. Urine analysis:    Component Value Date/Time   COLORURINE YELLOW 04/02/2020 [place] 04/02/2020 1227   LABSPEC 1.011 04/02/2020 1227   PHURINE 5.0 04/02/2020 1227   GLUCOSEU NEGATIVE 04/02/2020 1227   HGBUR LARGE (A) 04/02/2020 1227   BILIRUBINUR NEGATIVE 04/02/2020 [place] 04/02/2020 1227   PROTEINUR 30 (A) 04/02/2020 1227   NITRITE NEGATIVE 04/02/2020 1227   LEUKOCYTESUR NEGATIVE 04/02/2020 1227   Sepsis Labs: _2 (procalcitonin:4,lacticidven:4)  ) Recent Results (from the past 240 hour(s))  Resp Panel by RT-PCR (Flu A&B, Covid) Nasopharyngeal Swab     Status: None   Collection Time: 04/02/20 11:12 AM   Specimen: Nasopharyngeal Swab; Nasopharyngeal(NP) swabs in vial transport medium  Result Value Ref Range Status   SARS Coronavirus 2 by RT PCR NEGATIVE NEGATIVE Final    Comment: (NOTE) SARS-CoV-2 target nucleic acids are NOT DETECTED.  The SARS-CoV-2 RNA is generally detectable in upper respiratory specimens during the acute phase of infection. The lowest concentration of SARS-CoV-2 viral copies this assay can detect is 138 copies/mL. A negative result does not preclude SARS-Cov-2 infection and should not be used as the sole basis for treatment or other patient management decisions. A negative result may occur with  improper specimen collection/handling, submission of specimen other than nasopharyngeal swab, presence of  viral mutation(s) within the areas targeted by this assay, and inadequate number of viral copies(<138 copies/mL). A negative result must be combined with clinical observations, patient history, and epidemiological information. The expected result is Negative.  Fact Sheet for Patients:  EntrepreneurPulse.com.au  Fact Sheet for Healthcare Providers:  IncredibleEmployment.be  This test is no t yet approved or cleared by the [place] FDA and  has been authorized for detection and/or diagnosis of SARS-CoV-2 by FDA under an Emergency Use Authorization (EUA). This EUA will remain  in effect (meaning this test can be used) for the duration of the COVID-19 declaration under Section 564(b)(1) of the Act, 21 U.S.C.section 360bbb-3(b)(1), unless the authorization is terminated  or revoked sooner.       Influenza A by PCR NEGATIVE NEGATIVE Final   Influenza B by PCR NEGATIVE NEGATIVE Final    Comment: (NOTE) The Xpert Xpress SARS-CoV-2/FLU/RSV plus assay is intended as an aid in the  diagnosis of influenza from Nasopharyngeal swab specimens and should not be used as a sole basis for treatment. Nasal washings and aspirates are unacceptable for Xpert Xpress SARS-CoV-2/FLU/RSV testing.  Fact Sheet for Patients: EntrepreneurPulse.com.au  Fact Sheet for Healthcare Providers: IncredibleEmployment.be  This test is not yet approved or cleared by the [place] FDA and has been authorized for detection and/or diagnosis of SARS-CoV-2 by FDA under an Emergency Use Authorization (EUA). This EUA will remain in effect (meaning this test can be used) for the duration of the COVID-19 declaration under Section 564(b)(1) of the Act, 21 U.S.C. section 360bbb-3(b)(1), unless the authorization is terminated or revoked.  Performed at [facility], [place] [address], [place], [place] [address]   MRSA PCR Screening     Status:  None   Collection Time: 04/02/20 11:01 PM   Specimen: Nasal Mucosa; Nasopharyngeal  Result Value Ref Range Status   MRSA by PCR NEGATIVE NEGATIVE Final    Comment:        The GeneXpert MRSA Assay (FDA approved for NASAL specimens only), is one component of a comprehensive MRSA colonization surveillance program. It is not intended to diagnose MRSA infection nor to guide or monitor treatment for MRSA infections. Performed at [facility], [place] [address], [place], [place] [address]       Studies: DG CHEST PORT 1 VIEW  Result Date: 04/04/2020 CLINICAL DATA:  Multiple myeloma EXAM: PORTABLE CHEST 1 VIEW COMPARISON:  Portable exam 1812 hours compared to 04/02/2020 FINDINGS: Stable heart size and pulmonary vascularity. Prominent soft tissue at RIGHT mediastinal border just above the RIGHT hilum, increased in prominence since the previous exam, corresponding to paraspinal mass on prior CT study. Scattered interstitial infiltrates are seen in both lungs which could represent atypical infection or edema. No pleural effusion or pneumothorax. Bones demineralized. Bone destruction of the posterior RIGHT third rib identified. IMPRESSION: Scattered interstitial infiltrates, question edema versus atypical infection. Electronically Signed   By: Lavonia Dana M.D.   On: 04/04/2020 18:35    Scheduled Meds: . cholecalciferol  2,000 Units Oral Daily  . fentaNYL  1 patch Transdermal Q72H  . gabapentin  300 mg Oral BID  . heparin  5,000 Units Subcutaneous Q8H  . metoprolol tartrate  25 mg Oral BID  . nicotine  21 mg Transdermal Daily  . polyethylene glycol  17 g Oral Daily  . potassium chloride  40 mEq Oral BID  . senna-docusate  2 tablet Oral BID  . sodium chloride flush  3 mL Intravenous Q12H    Continuous Infusions:    LOS: 3 days     Kayleen Memos, MD [facility] Pager [phone]  If 7PM-7AM, please contact night-coverage [email or web address] Password [facility] 04/05/2020, 3:53 PM

## 2020-04-05 NOTE — Progress Notes (Signed)
START ON PATHWAY REGIMEN - Multiple Myeloma and Other Plasma Cell Dyscrasias     A cycle is every 21 days:     Dexamethasone      Bortezomib      Cyclophosphamide   **Always confirm dose/schedule in your pharmacy ordering system**  Patient Characteristics: Multiple Myeloma, Newly Diagnosed, Transplant Eligible, Unknown or Awaiting Test Results Disease Classification: Multiple Myeloma R-ISS Staging: Unknown Therapeutic Status: Newly Diagnosed Is Patient Eligible for Transplant<= Transplant Eligible Risk Status: Awaiting Test Results Intent of Therapy: Non-Curative / Palliative Intent, Discussed with Patient 

## 2020-04-05 NOTE — Progress Notes (Addendum)
[facility]   HEMATOLOGY/ONCOLOGY INPATIENT PROGRESS NOTE  Date of Service: 04/04/2019  Inpatient Attending: .Kayleen Memos, DO   SUBJECTIVE  Ms. Liskey was seen in follow-up for likely multiple myeloma.  Calcium level has normalized and renal function continues to improve. She reports the pain is much better controlled today.  Radicular pain has overall improved. She tells me that she was able to walk with a walker from her bed to the bathroom. Radiation is started 04/04/2020. Bowels are moving much better at this time.   Received last dose of dexamethasone 2/17. Bone marrow biopsy results are pending.  We will call pathology later today to get a preliminary report. Her LDH was normal, beta-2 microglobulin elevated at 11.1, kappa free light chain and lambda free light chains were normal with mildly elevated kappa, lambda light chain ratio of 1.88.  Her multiple myeloma panel, protein electrophoresis, and UPEP are all still pending.  OBJECTIVE:  NAD  PHYSICAL EXAMINATION: . Vitals:   04/04/20 2250 04/05/20 0141 04/05/20 0218 04/05/20 0611  BP: (!) 158/97 (!) 182/100 (!) 165/99 (!) 181/109  Pulse: (!) 109 (!) 102 96 (!) 104  Resp: 18 18  18   Temp: 98.4 F (36.9 C) 97.6 F (36.4 C)  98.1 F (36.7 C)  TempSrc: Oral Oral  Oral  SpO2: 96% 97%  98%  Weight:      Height:       Filed Weights   04/02/20 1600 04/02/20 2230 04/03/20 2326  Weight: 108.9 kg 108.9 kg 106.9 kg   .Body mass index is 40.45 kg/m.  GENERAL:alert, in no acute distress and comfortable SKIN: skin color, texture, turgor are normal, no rashes or significant lesions EYES: normal, conjunctiva are pink and non-injected, sclera clear OROPHARYNX:no exudate, no erythema and lips, buccal mucosa, and tongue normal  NECK: supple, no JVD, thyroid normal size, non-tender, without nodularity LYMPH:  no palpable lymphadenopathy in the cervical, axillary or inguinal LUNGS: clear to auscultation with normal respiratory  effort HEART: regular rate & rhythm,  no murmurs and no lower extremity edema ABDOMEN: abdomen soft, non-tender, normoactive bowel sounds  Musculoskeletal: no cyanosis of digits and no clubbing  PSYCH: alert & oriented x 3 with fluent speech NEURO: no focal motor/sensory deficits  MEDICAL HISTORY:  Past Medical History:  Diagnosis Date  . Back pain     SURGICAL HISTORY: History reviewed. No pertinent surgical history.  SOCIAL HISTORY: Social History   Socioeconomic History  . Marital status: Married    Spouse name: Not on file  . Number of children: Not on file  . Years of education: Not on file  . Highest education level: Not on file  Occupational History  . Not on file  Tobacco Use  . Smoking status: Current Every Day Smoker    Packs/day: 0.50    Years: 20.00    Pack years: 10.00    Types: Cigarettes  . Smokeless tobacco: Never Used  Vaping Use  . Vaping Use: Every day  Substance and Sexual Activity  . Alcohol use: Not Currently  . Drug use: Not Currently  . Sexual activity: Not on file  Other Topics Concern  . Not on file  Social History Narrative  . Not on file   Social Determinants of Health   Financial Resource Strain: Not on file  Food Insecurity: Not on file  Transportation Needs: Not on file  Physical Activity: Not on file  Stress: Not on file  Social Connections: Not on file  Intimate Partner Violence:  Not on file    FAMILY HISTORY: Family History  Problem Relation Age of Onset  . Lung cancer Maternal Grandfather   . Pancreatic cancer Paternal Grandfather     ALLERGIES:  is allergic to azithromycin.  MEDICATIONS:  Scheduled Meds: . cholecalciferol  2,000 Units Oral Daily  . fentaNYL  1 patch Transdermal Q72H  . gabapentin  300 mg Oral BID  . heparin  5,000 Units Subcutaneous Q8H  . metoprolol tartrate  25 mg Oral BID  . nicotine  21 mg Transdermal Daily  . polyethylene glycol  17 g Oral Daily  . potassium chloride  40 mEq Oral BID   . senna-docusate  2 tablet Oral BID  . sodium chloride flush  3 mL Intravenous Q12H   Continuous Infusions:  PRN Meds:.albuterol, fentaNYL (SUBLIMAZE) injection, labetalol, LORazepam, ondansetron **OR** ondansetron (ZOFRAN) IV, oxyCODONE  REVIEW OF SYSTEMS:    10 Point review of Systems was done is negative except as noted above.   LABORATORY DATA:  I have reviewed the data as listed  CBC    Component Value Date/Time   WBC 14.8 (H) 04/05/2020 0503   RBC 2.82 (L) 04/05/2020 0503   HGB 8.9 (L) 04/05/2020 0503   HCT 27.4 (L) 04/05/2020 0503   PLT 344 04/05/2020 0503   MCV 97.2 04/05/2020 0503   MCH 31.6 04/05/2020 0503   MCHC 32.5 04/05/2020 0503   RDW 15.6 (H) 04/05/2020 0503   LYMPHSABS 0.6 (L) 04/04/2020 0958   MONOABS 0.7 04/04/2020 0958   EOSABS 0.1 04/04/2020 0958   BASOSABS 0.0 04/04/2020 0958    CMP Latest Ref Rng & Units 04/05/2020 04/04/2020 04/03/2020  Glucose 70 - 99 mg/dL 99 109(H) 130(H)  BUN 6 - 20 mg/dL 50(H) 45(H) 41(H)  Creatinine 0.44 - 1.00 mg/dL 1.58(H) 1.70(H) 2.62(H)  Sodium 135 - 145 mmol/L 137 132(L) 131(L)  Potassium 3.5 - 5.1 mmol/L 3.5 3.1(L) 3.5  Chloride 98 - 111 mmol/L 109 109 104  CO2 22 - 32 mmol/L 22 19(L) 21(L)  Calcium 8.9 - 10.3 mg/dL 10.1 9.9 12.1(H)  Total Protein 6.5 - 8.1 g/dL 9.1(H) 9.2(H) 9.9(H)  Total Bilirubin 0.3 - 1.2 mg/dL 0.5 0.6 0.4  Alkaline Phos 38 - 126 U/L 240(H) 222(H) 219(H)  AST 15 - 41 U/L 17 21 16   ALT 0 - 44 U/L 26 28 26     RADIOGRAPHIC STUDIES: I have personally reviewed the radiological images as listed and agreed with the findings in the report. DG Chest 2 View  Result Date: 04/02/2020 CLINICAL DATA:  45 year old female with tachycardia and low grade fever. EXAM: CHEST - 2 VIEW COMPARISON:  03/10/2018 FINDINGS: The heart size and mediastinal contours are within normal limits. Both lungs are clear. The visualized skeletal structures are unremarkable. IMPRESSION: No acute cardiopulmonary process.  Electronically Signed   By: Ruthann Cancer MD   On: 04/02/2020 09:09   CT L-SPINE NO CHARGE  Result Date: 04/02/2020 CLINICAL DATA:  Back pain.  Pathologic fracture with hypercalcemia. EXAM: CT LUMBAR SPINE WITHOUT CONTRAST TECHNIQUE: Multidetector CT imaging of the lumbar spine was performed without intravenous contrast administration. Multiplanar CT image reconstructions were also generated. COMPARISON:  Thoracolumbar spine radiographs 03/07/2020 FINDINGS: Segmentation: 5 lumbar type vertebrae. Alignment: Straightening of the normal lumbar lordosis. No significant listhesis. Vertebrae: Widespread lytic bone lesions throughout the lumbar spine as well as included lower thoracic spine and pelvis. Pathologic T12 compression fracture with 50% vertebral body height loss, progressed from the prior radiographs. New pathologic L4 fracture involving  the vertebral body and pedicles with 85% vertebral body height loss centrally and 6 mm retropulsion of the posterior vertebral cortex. Large lesion involving the left pedicle, pars, and transverse process of L1 with possible nondisplaced pathologic fracture. Paraspinal and other soft tissues: No acute paraspinal soft tissue abnormality. Intra-abdominal and pelvic contents reported separately. 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 IMPRESSION: 1. Widespread lytic bone lesions consistent with metastatic disease. 2. Pathologic T12 and L4 vertebral fractures as above. Possible nondisplaced pathologic fracture of the left L1 posterior elements. 3. 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 Electronically Signed   By: Logan Bores M.D.   On: 04/02/2020 12:52   DG CHEST PORT 1 VIEW  Result Date: 04/04/2020 CLINICAL DATA:  Multiple myeloma EXAM: PORTABLE CHEST 1 VIEW COMPARISON:  Portable exam 1812 hours compared to 04/02/2020 FINDINGS: Stable heart size and pulmonary vascularity. Prominent soft tissue at RIGHT  mediastinal border just above the RIGHT hilum, increased in prominence since the previous exam, corresponding to paraspinal mass on prior CT study. Scattered interstitial infiltrates are seen in both lungs which could represent atypical infection or edema. No pleural effusion or pneumothorax. Bones demineralized. Bone destruction of the posterior RIGHT third rib identified. IMPRESSION: Scattered interstitial infiltrates, question edema versus atypical infection. Electronically Signed   By: Lavonia Dana M.D.   On: 04/04/2020 18:35   DG Bone Survey Met  Result Date: 04/02/2020 CLINICAL DATA:  Lytic lesion on x-ray.  Possible multiple myeloma. EXAM: METASTATIC BONE SURVEY COMPARISON:  CT of same day. FINDINGS: Ill-defined lucencies are noted throughout the skull which may represent lytic lesions. Lucencies are noted in the bilateral humeri. Lytic lesions are seen involving the right third and fourth ribs. Probable lytic lesion is seen involving the left fourth rib. Lytic lesions are noted in the left scapula. Fractures are seen involving the T12 and L4 vertebral bodies consistent with pathologic fractures. Lucencies are seen throughout the pelvis and visualized proximal femurs consistent with lytic lesions. IMPRESSION: Multiple lytic lesions are noted consistent with multiple myeloma or metastatic disease. Pathologic fractures are seen involving the T12 and L4 vertebral bodies. Electronically Signed   By: Marijo Conception M.D.   On: 04/02/2020 15:29   CT BONE MARROW BIOPSY & ASPIRATION  Result Date: 04/03/2020 INDICATION: 45 year old female with newly diagnosed multiple myeloma. She presents for CT-guided bone marrow biopsy to assess for marrow involvement. EXAM: CT GUIDED BONE MARROW ASPIRATION AND CORE BIOPSY Interventional Radiologist:  Criselda Peaches, MD MEDICATIONS: None. ANESTHESIA/SEDATION: Moderate (conscious) sedation was employed during this procedure. A total of 4 milligrams versed and 150  micrograms fentanyl were administered intravenously. The patient's level of consciousness and vital signs were monitored continuously by radiology nursing throughout the procedure under my direct supervision. Total monitored sedation time: 18 minutes FLUOROSCOPY TIME:  None COMPLICATIONS: None immediate. Estimated blood loss: <25 mL PROCEDURE: Informed written consent was obtained from the patient after a thorough discussion of the procedural risks, benefits and alternatives. All questions were addressed. Maximal Sterile Barrier Technique was utilized including caps, mask, sterile gowns, sterile gloves, sterile drape, hand hygiene and skin antiseptic. A timeout was performed prior to the initiation of the procedure. The patient was positioned prone and non-contrast localization CT was performed of the pelvis to demonstrate the iliac marrow spaces. Maximal barrier sterile technique utilized including caps, mask, sterile gowns, sterile gloves, large sterile drape, hand hygiene, and betadine prep. Under sterile conditions and  local anesthesia, an 11 gauge coaxial bone biopsy needle was advanced into the right iliac marrow space. Needle position was confirmed with CT imaging. Initially, bone marrow aspiration was performed. Next, the 11 gauge outer cannula was utilized to obtain a right iliac bone marrow core biopsy. Needle was removed. Hemostasis was obtained with compression. The patient tolerated the procedure well. Samples were prepared with the cytotechnologist. IMPRESSION: Technically successful CT-guided bone marrow aspiration and core biopsy of the right iliac bone. Electronically Signed   By: Jacqulynn Cadet M.D.   On: 04/03/2020 19:33   DG FEMUR PORT MIN 2 VIEWS LEFT  Result Date: 04/02/2020 CLINICAL DATA:  Bilateral thigh pain.  History of myeloma. EXAM: LEFT FEMUR PORTABLE 2 VIEWS COMPARISON:  Femur exam on bone survey earlier today. Included portion from CT earlier today. FINDINGS: Lucent lesion in  the lesser trochanter was better appreciated on CT earlier today. There are multiple small lucencies involving the femoral head and intertrochanteric femur that are not well seen by radiograph. No obvious destructive lesion in the femoral shaft. No evidence of pathologic fracture. IMPRESSION: Proximal femoral lesions including a lesion of the lesser trochanter, majority of these are better seen on CT earlier today. There is no evidence of acute femur fracture or large destructive lesion. Consider MRI for more detailed assessment. Electronically Signed   By: Keith Rake M.D.   On: 04/02/2020 21:21   DG FEMUR PORT, MIN 2 VIEWS RIGHT  Result Date: 04/02/2020 CLINICAL DATA:  Right thigh pain.  Myeloma. EXAM: RIGHT FEMUR PORTABLE 2 VIEW COMPARISON:  Included portion from bone survey earlier today. Included portion from pelvis CT earlier today. FINDINGS: Lucent lesion in the intertrochanteric femur on CT earlier today is only faintly visualized, this is at risk for pathologic fracture. There is some thinning of the lateral cortex of the greater trochanter. There are multiple additional small lucencies in the femoral head on CT that are not well seen by radiograph. Small lesion noted in the distal lateral aspect of the femur in the region of the physeal scar. No evidence of acute fracture. IMPRESSION: Relatively large lucent lesion in the intertrochanteric femur on CT earlier today is only faintly visualized, this is at risk for pathologic fracture. There are multiple additional lucencies in the femoral head on CT are not well seen by radiograph. There is a small lesion in the distal lateral femoral metaphysis. MRI may be of value for more detailed assessment. Electronically Signed   By: Keith Rake M.D.   On: 04/02/2020 21:23   CT CHEST ABDOMEN PELVIS WO CONTRAST  Result Date: 04/02/2020 CLINICAL DATA:  Thoracic and lumbar spine pain. Pathologic fracture with hypercalcemia. EXAM: CT CHEST, ABDOMEN AND  PELVIS WITHOUT CONTRAST TECHNIQUE: Multidetector CT imaging of the chest, abdomen and pelvis was performed following the standard protocol without IV contrast. COMPARISON:  Chest radiographs 04/02/2020. Thoracolumbar spine radiographs 03/07/2020. FINDINGS: CT CHEST FINDINGS Cardiovascular: Normal caliber of the thoracic aorta. Normal heart size. No pericardial effusion. Mediastinum/Nodes: No enlarged axillary, mediastinal, or hilar lymph nodes are identified with hilar assessment limited in the absence of IV contrast. Unremarkable visualized thyroid and esophagus. Lungs/Pleura: No pleural effusion or pneumothorax. Patchy and hazy ground-glass opacities in the left greater than right upper lobes. No parenchymal lung mass. Musculoskeletal: Widespread lytic bone lesions throughout the visualized skeleton including a destructive 5 cm lesion involving the posterior right third rib and a 3 cm destructive lesion involving the posteromedial right sixth rib. Smaller destructive lesions with nondisplaced pathologic  fractures of the posterolateral right fourth and posterior left fourth ribs. Large lesion involving much of the T1 vertebral body with disruption of the posterior vertebral body cortex but no gross epidural tumor. Pathologic vertebral body fractures with mild vertebral body height loss at T7 and T8 and moderate height loss at T12. CT ABDOMEN PELVIS FINDINGS Hepatobiliary: No focal liver abnormality is seen. No gallstones, gallbladder wall thickening, or biliary dilatation. Pancreas: Unremarkable. Spleen: Unremarkable. Adrenals/Urinary Tract: Unremarkable adrenal glands. No evidence of renal mass, calculi, or hydronephrosis. Unremarkable bladder. Stomach/Bowel: The stomach is moderately distended by fluid and is otherwise unremarkable. There is no evidence of bowel obstruction or inflammation. The appendix is unremarkable. Vascular/Lymphatic: Normal caliber of the abdominal aorta. No enlarged lymph nodes.  Reproductive: Grossly unremarkable uterus and ovaries. Other: No ascites. Musculoskeletal: Widespread lytic bone lesions throughout the spine and pelvis with the lumbar spine evaluated in detail on the separate dedicated spine CT. IMPRESSION: 1. Widespread lytic lesions throughout the visualized skeleton consistent with metastatic disease or multiple myeloma. No definite primary malignancy identified in the chest, abdomen, or pelvis. 2. Nonspecific pulmonary ground-glass opacities in the left greater than right upper lobes, likely infectious or inflammatory. Electronically Signed   By: Logan Bores M.D.   On: 04/02/2020 13:12    ASSESSMENT & PLAN:   45 year old very pleasant lady with  1) likely newly diagnosed multiple myeloma with extensive bone lesions 2) hypercalcemia due to multiple myeloma-now resolved with IV fluids, calcitonin, pamidronate. 3) anemia due to multiple myeloma becoming more apparent as the patient's hemoconcentration due to dehydration has been corrected.   4) acute renal failure related to dehydration hypercalcemia and multiple myeloma.  Renal function is improving with IV fluids and improving calcium levels 5) multilevel pathologic fractures in the spine most symptomatic at L4-5 with some epidural tumor and left lower extremity radicular pain 6) severe constipation related to chronic constipation plus hypercalcemia plus opiates 7) hyponatremia related to dehydration improving with IV fluids  PLAN -Continue IV fluids as per hospital medicine team -Continue fentanyl patch at 12 mcg/h with continued oxycodone as needed -Continue Neurontin for left lower extremity radicular pain.  Dose has been increased to 300 mg twice daily today. -Continue current bowel regimen. -Started on vitamin D 2000 units daily for vitamin D deficiency -Received 1 unit PRBCs 04/04/2020 with improvement of her hemoglobin.  Continue to monitor and transfuse for hemoglobin less than 7. -Continue  radiation under the care of Dr. Lisbeth Renshaw. -Neurosurgery input appreciated -Appreciate excellent hospital medicine care. -She completed 4 days of high-dose dexamethasone on 2/17.  We will likely plan to treat the patient with CyBorD once final diagnosis is confirmed on labs and bone marrow biopsy.  Will call later today to get preliminary report. -Pending myeloma panel bone marrow biopsy results. -We will likely need outpatient PET CT scan -Oncology will continue to follow closely  Mikey Bussing, DNP, AGPCNP-BC, AOCNP   ADDENDUM  .Patient was Personally and independently interviewed, examined and relevant elements of the history of present illness were reviewed in details and an assessment and plan was created. All elements of the patient's history of present illness , assessment and plan were discussed in details with Mikey Bussing, DNP, AGPCNP-BC, AOCNP.  The above documentation reflects our combined findings assessment and plan. Prelim pathology -- likely consistent with Multiple myeloma  Sullivan Lone MD MS

## 2020-04-06 ENCOUNTER — Inpatient Hospital Stay (HOSPITAL_COMMUNITY): Payer: BC Managed Care – PPO

## 2020-04-06 DIAGNOSIS — R7989 Other specified abnormal findings of blood chemistry: Secondary | ICD-10-CM

## 2020-04-06 DIAGNOSIS — C9000 Multiple myeloma not having achieved remission: Secondary | ICD-10-CM | POA: Diagnosis not present

## 2020-04-06 DIAGNOSIS — I361 Nonrheumatic tricuspid (valve) insufficiency: Secondary | ICD-10-CM

## 2020-04-06 DIAGNOSIS — G893 Neoplasm related pain (acute) (chronic): Secondary | ICD-10-CM | POA: Diagnosis not present

## 2020-04-06 DIAGNOSIS — D649 Anemia, unspecified: Secondary | ICD-10-CM | POA: Diagnosis not present

## 2020-04-06 LAB — COMPREHENSIVE METABOLIC PANEL
ALT: 26 U/L (ref 0–44)
AST: 11 U/L — ABNORMAL LOW (ref 15–41)
Albumin: 2.5 g/dL — ABNORMAL LOW (ref 3.5–5.0)
Alkaline Phosphatase: 266 U/L — ABNORMAL HIGH (ref 38–126)
Anion gap: 6 (ref 5–15)
BUN: 51 mg/dL — ABNORMAL HIGH (ref 6–20)
CO2: 22 mmol/L (ref 22–32)
Calcium: 9.9 mg/dL (ref 8.9–10.3)
Chloride: 105 mmol/L (ref 98–111)
Creatinine, Ser: 1.35 mg/dL — ABNORMAL HIGH (ref 0.44–1.00)
GFR, Estimated: 50 mL/min — ABNORMAL LOW (ref >60)
Glucose, Bld: 89 mg/dL (ref 70–99)
Potassium: 3.8 mmol/L (ref 3.5–5.1)
Sodium: 133 mmol/L — ABNORMAL LOW (ref 135–145)
Total Bilirubin: 0.6 mg/dL (ref 0.3–1.2)
Total Protein: 8.8 g/dL — ABNORMAL HIGH (ref 6.5–8.1)

## 2020-04-06 LAB — CBC
HCT: 28.1 % — ABNORMAL LOW (ref 36.0–46.0)
Hemoglobin: 9 g/dL — ABNORMAL LOW (ref 12.0–15.0)
MCH: 31.3 pg (ref 26.0–34.0)
MCHC: 32 g/dL (ref 30.0–36.0)
MCV: 97.6 fL (ref 80.0–100.0)
Platelets: 253 10*3/uL (ref 150–400)
RBC: 2.88 MIL/uL — ABNORMAL LOW (ref 3.87–5.11)
RDW: 15.6 % — ABNORMAL HIGH (ref 11.5–15.5)
WBC: 9.7 10*3/uL (ref 4.0–10.5)
nRBC: 0 % (ref 0.0–0.2)

## 2020-04-06 LAB — ECHOCARDIOGRAM COMPLETE
Area-P 1/2: 2.6 cm2
Height: 64 in
S' Lateral: 3 cm
Weight: 3770.75 oz

## 2020-04-06 LAB — D-DIMER, QUANTITATIVE: D-Dimer, Quant: 3.4 ug/mL-FEU — ABNORMAL HIGH (ref 0.00–0.50)

## 2020-04-06 MED ORDER — DEXAMETHASONE 4 MG PO TABS
4.0000 mg | ORAL_TABLET | Freq: Every day | ORAL | Status: DC
  Administered 2020-04-07 – 2020-04-09 (×3): 4 mg via ORAL
  Filled 2020-04-06 (×3): qty 1

## 2020-04-06 MED ORDER — FENTANYL 25 MCG/HR TD PT72
1.0000 | MEDICATED_PATCH | TRANSDERMAL | Status: DC
  Administered 2020-04-06: 1 via TRANSDERMAL
  Filled 2020-04-06: qty 1

## 2020-04-06 MED ORDER — METOPROLOL TARTRATE 50 MG PO TABS
50.0000 mg | ORAL_TABLET | Freq: Two times a day (BID) | ORAL | Status: DC
  Administered 2020-04-06 – 2020-04-09 (×7): 50 mg via ORAL
  Filled 2020-04-06 (×7): qty 1

## 2020-04-06 NOTE — Progress Notes (Signed)
Stacey Montoya Kitchen   HEMATOLOGY/ONCOLOGY INPATIENT PROGRESS NOTE  Date of Service: 04/04/2019  Inpatient Attending: .Kayleen Memos, DO   SUBJECTIVE  Stacey Montoya is in good spirits today.  Increased mobilization was out of the bed in the chair for 1 hour today and has been trying to ambulate a little bit with her walker.  Some increased pain with ambulation and mobility.  Fentanyl patch was increased to 25 mcg/h in keeping with her need for breakthrough medications and to foster improved mobility.  Also started dexamethasone 4 mg p.o. daily to while on radiation. Discussed the bone marrow biopsy results confirming multiple myeloma. Myeloma panel shows M spike of 4.1 g/dL.   OBJECTIVE:  NAD  PHYSICAL EXAMINATION: . Vitals:   04/05/20 1805 04/05/20 2005 04/06/20 0535 04/06/20 1503  BP: (!) 160/97 (!) 156/96 (!) 188/99 (!) 143/93  Pulse: (!) 105 (!) 110 97 96  Resp: 18 18  18   Temp: 97.6 F (36.4 C) 97.6 F (36.4 C) 97.8 F (36.6 C) 98.7 F (37.1 C)  TempSrc: Oral Oral Oral Oral  SpO2: 93% 93% 94% 95%  Weight:      Height:       Filed Weights   04/02/20 1600 04/02/20 2230 04/03/20 2326  Weight: 240 lb (108.9 kg) 240 lb (108.9 kg) 235 lb 10.8 oz (106.9 kg)   .Body mass index is 40.45 kg/m.  GENERAL:alert, in no acute distress and comfortable SKIN: skin color, texture, turgor are normal, no rashes or significant lesions EYES: normal, conjunctiva are pink and non-injected, sclera clear OROPHARYNX:no exudate, no erythema and lips, buccal mucosa, and tongue normal  NECK: supple, no JVD, thyroid normal size, non-tender, without nodularity LYMPH:  no palpable lymphadenopathy in the cervical, axillary or inguinal LUNGS: clear to auscultation with normal respiratory effort HEART: regular rate & rhythm,  no murmurs and no lower extremity edema ABDOMEN: abdomen soft, non-tender, normoactive bowel sounds  Musculoskeletal: no cyanosis of digits and no clubbing  PSYCH: alert & oriented x 3 with  fluent speech NEURO: no focal motor/sensory deficits  MEDICAL HISTORY:  Past Medical History:  Diagnosis Date  . Back pain     SURGICAL HISTORY: History reviewed. No pertinent surgical history.  SOCIAL HISTORY: Social History   Socioeconomic History  . Marital status: Married    Spouse name: Not on file  . Number of children: Not on file  . Years of education: Not on file  . Highest education level: Not on file  Occupational History  . Not on file  Tobacco Use  . Smoking status: Current Every Day Smoker    Packs/day: 0.50    Years: 20.00    Pack years: 10.00    Types: Cigarettes  . Smokeless tobacco: Never Used  Vaping Use  . Vaping Use: Every day  Substance and Sexual Activity  . Alcohol use: Not Currently  . Drug use: Not Currently  . Sexual activity: Not on file  Other Topics Concern  . Not on file  Social History Narrative  . Not on file   Social Determinants of Health   Financial Resource Strain: Not on file  Food Insecurity: Not on file  Transportation Needs: Not on file  Physical Activity: Not on file  Stress: Not on file  Social Connections: Not on file  Intimate Partner Violence: Not on file    FAMILY HISTORY: Family History  Problem Relation Age of Onset  . Lung cancer Maternal Grandfather   . Pancreatic cancer Paternal Grandfather  ALLERGIES:  is allergic to azithromycin.  MEDICATIONS:  Scheduled Meds: . cholecalciferol  2,000 Units Oral Daily  . fentaNYL  1 patch Transdermal Q72H  . gabapentin  300 mg Oral BID  . heparin  5,000 Units Subcutaneous Q8H  . metoprolol tartrate  50 mg Oral BID  . nicotine  21 mg Transdermal Daily  . polyethylene glycol  17 g Oral Daily  . senna-docusate  2 tablet Oral BID  . sodium chloride flush  3 mL Intravenous Q12H   Continuous Infusions:  PRN Meds:.albuterol, labetalol, LORazepam, ondansetron **OR** ondansetron (ZOFRAN) IV, oxyCODONE  REVIEW OF SYSTEMS:    10 Point review of Systems was  done is negative except as noted above.   LABORATORY DATA:  I have reviewed the data as listed  CBC    Component Value Date/Time   WBC 9.7 04/06/2020 0607   RBC 2.88 (L) 04/06/2020 0607   HGB 9.0 (L) 04/06/2020 0607   HCT 28.1 (L) 04/06/2020 0607   PLT 253 04/06/2020 0607   MCV 97.6 04/06/2020 0607   MCH 31.3 04/06/2020 0607   MCHC 32.0 04/06/2020 0607   RDW 15.6 (H) 04/06/2020 0607   LYMPHSABS 0.6 (L) 04/04/2020 0958   MONOABS 0.7 04/04/2020 0958   EOSABS 0.1 04/04/2020 0958   BASOSABS 0.0 04/04/2020 0958    CMP Latest Ref Rng & Units 04/06/2020 04/05/2020 04/04/2020  Glucose 70 - 99 mg/dL 89 99 109(H)  BUN 6 - 20 mg/dL 51(H) 50(H) 45(H)  Creatinine 0.44 - 1.00 mg/dL 1.35(H) 1.58(H) 1.70(H)  Sodium 135 - 145 mmol/L 133(L) 137 132(L)  Potassium 3.5 - 5.1 mmol/L 3.8 3.5 3.1(L)  Chloride 98 - 111 mmol/L 105 109 109  CO2 22 - 32 mmol/L 22 22 19(L)  Calcium 8.9 - 10.3 mg/dL 9.9 10.1 9.9  Total Protein 6.5 - 8.1 g/dL 8.8(H) 9.1(H) 9.2(H)  Total Bilirubin 0.3 - 1.2 mg/dL 0.6 0.5 0.6  Alkaline Phos 38 - 126 U/L 266(H) 240(H) 222(H)  AST 15 - 41 U/L 11(L) 17 21  ALT 0 - 44 U/L 26 26 28    Component     Latest Ref Rng & Units 04/03/2020         4:32 AM  IgG (Immunoglobin G), Serum     586 - 1,602 mg/dL 5,749 (H)  IgA     87 - 352 mg/dL 38 (L)  IgM (Immunoglobulin M), Srm     26 - 217 mg/dL 9 (L)  Total Protein ELP     6.0 - 8.5 g/dL 10.0 (H)  Albumin SerPl Elph-Mcnc     2.9 - 4.4 g/dL 3.1  Alpha 1     0.0 - 0.4 g/dL 0.5 (H)  Alpha2 Glob SerPl Elph-Mcnc     0.4 - 1.0 g/dL 1.0  B-Globulin SerPl Elph-Mcnc     0.7 - 1.3 g/dL 1.1  Gamma Glob SerPl Elph-Mcnc     0.4 - 1.8 g/dL 4.3 (H)  M Protein SerPl Elph-Mcnc     Not Observed g/dL 4.1 (H)  Globulin, Total     2.2 - 3.9 g/dL 6.9 (H)  Albumin/Glob SerPl     0.7 - 1.7 0.5 (L)  IFE 1      Comment (A)  Please Note (HCV):      Comment  Albumin ELP     2.9 - 4.4 g/dL   Alpha-1-Globulin     0.0 - 0.4 g/dL    Alpha-2-Globulin     0.4 - 1.0 g/dL   Beta  Globulin     0.7 - 1.3 g/dL   Gamma Globulin     0.4 - 1.8 g/dL   M-SPIKE, %     Not Observed g/dL   SPE Interp.        Comment        A/G Ratio     0.7 - 1.7    Component     Latest Ref Rng & Units 04/02/2020          IgG (Immunoglobin G), Serum     586 - 1,602 mg/dL   IgA     87 - 352 mg/dL   IgM (Immunoglobulin M), Srm     26 - 217 mg/dL   Total Protein ELP     6.0 - 8.5 g/dL   Albumin SerPl Elph-Mcnc     2.9 - 4.4 g/dL   Alpha 1     0.0 - 0.4 g/dL   Alpha2 Glob SerPl Elph-Mcnc     0.4 - 1.0 g/dL   B-Globulin SerPl Elph-Mcnc     0.7 - 1.3 g/dL   Gamma Glob SerPl Elph-Mcnc     0.4 - 1.8 g/dL   M Protein SerPl Elph-Mcnc     Not Observed g/dL   Globulin, Total     2.2 - 3.9 g/dL   Albumin/Glob SerPl     0.7 - 1.7   IFE 1        Please Note (HCV):        Albumin ELP     2.9 - 4.4 g/dL   Alpha-1-Globulin     0.0 - 0.4 g/dL   Alpha-2-Globulin     0.4 - 1.0 g/dL   Beta Globulin     0.7 - 1.3 g/dL   Gamma Globulin     0.4 - 1.8 g/dL   M-SPIKE, %     Not Observed g/dL   SPE Interp.        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 Plasma cells show aberrant CD56 expression by  immunohistochemistry and are kappa-restricted by light chain in situ  hybridization. Plasma cells are increased on aspirate smears (12% by  manual differential count) and show atypical morphology. 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 Correlation with clinical findings, radiographic  studies, other laboratory data (  eg. SPEP, immunofixation), and  cytogenetics/FISH results is recommended.   Preliminary findings were reported to K. Curcio on 04/05/20 at 1300 by S.  O'Neill.  RADIOGRAPHIC STUDIES: I have personally reviewed the radiological images as listed and agreed with the findings in the report. DG Chest 2 View  Result Date: 04/02/2020 CLINICAL DATA:  45 year old female with tachycardia and low grade fever. EXAM: CHEST - 2 VIEW COMPARISON:  03/10/2018 FINDINGS: The heart size and mediastinal contours are within normal limits. Both lungs are clear. The visualized skeletal structures are unremarkable. IMPRESSION: No acute cardiopulmonary process. Electronically Signed   By: Ruthann Cancer MD   On: 04/02/2020 09:09   CT L-SPINE NO CHARGE  Result Date: 04/02/2020 CLINICAL DATA:  Back pain.  Pathologic fracture with hypercalcemia. EXAM: CT LUMBAR SPINE WITHOUT CONTRAST TECHNIQUE: Multidetector CT imaging of the lumbar spine was performed without intravenous contrast administration. Multiplanar CT image reconstructions were also generated. COMPARISON:  Thoracolumbar spine radiographs 03/07/2020 FINDINGS: Segmentation: 5 lumbar type vertebrae. Alignment: Straightening of the normal lumbar lordosis. No significant listhesis. Vertebrae: Widespread lytic bone lesions throughout the lumbar spine as well as included lower thoracic spine and pelvis. Pathologic T12 compression fracture  with 50% vertebral body height loss, progressed from the prior radiographs. New pathologic L4 fracture involving the vertebral body and pedicles with 85% vertebral body height loss centrally and 6 mm retropulsion of the posterior vertebral cortex. Large lesion involving the left pedicle, pars, and transverse process of L1 with possible nondisplaced pathologic fracture. Paraspinal and other soft tissues: No acute paraspinal soft tissue abnormality. Intra-abdominal and pelvic contents reported separately. 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 IMPRESSION: 1. Widespread lytic bone lesions consistent with metastatic disease. 2. Pathologic T12 and L4 vertebral fractures as above. Possible nondisplaced pathologic fracture of the left L1 posterior elements. 3. 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 Electronically Signed   By: Logan Bores M.D.   On: 04/02/2020 12:52   DG CHEST PORT 1 VIEW  Result Date: 04/04/2020 CLINICAL DATA:  Multiple myeloma EXAM: PORTABLE CHEST 1 VIEW COMPARISON:  Portable exam 1812 hours compared to 04/02/2020 FINDINGS: Stable heart size and pulmonary vascularity. Prominent soft tissue at RIGHT mediastinal border just above the RIGHT hilum, increased in prominence since the previous exam, corresponding to paraspinal mass on prior CT study. Scattered interstitial infiltrates are seen in both lungs which could represent atypical infection or edema. No pleural effusion or pneumothorax. Bones demineralized. Bone destruction of the posterior RIGHT third rib identified. IMPRESSION: Scattered interstitial infiltrates, question edema versus atypical infection. Electronically Signed   By: Lavonia Dana M.D.   On: 04/04/2020 18:35   DG Bone Survey Met  Result Date: 04/02/2020 CLINICAL DATA:  Lytic lesion on x-ray.  Possible multiple myeloma. EXAM: METASTATIC BONE SURVEY COMPARISON:  CT of same day. FINDINGS: Ill-defined lucencies  are noted throughout the skull which may represent lytic lesions. Lucencies are noted in the bilateral humeri. Lytic lesions are seen involving the right third and fourth ribs. Probable lytic lesion is seen involving the left fourth rib. Lytic lesions are noted in the left scapula. Fractures are seen involving the T12 and L4 vertebral bodies consistent with pathologic fractures. Lucencies are seen throughout the pelvis and visualized proximal femurs consistent with lytic lesions. IMPRESSION: Multiple lytic lesions are noted consistent with multiple myeloma or metastatic disease. Pathologic fractures are seen involving the T12 and L4 vertebral bodies. Electronically Signed   By:  Marijo Conception M.D.   On: 04/02/2020 15:29   CT BONE MARROW BIOPSY & ASPIRATION  Result Date: 04/03/2020 INDICATION: 45 year old female with newly diagnosed multiple myeloma. She presents for CT-guided bone marrow biopsy to assess for marrow involvement. EXAM: CT GUIDED BONE MARROW ASPIRATION AND CORE BIOPSY Interventional Radiologist:  Criselda Peaches, MD MEDICATIONS: None. ANESTHESIA/SEDATION: Moderate (conscious) sedation was employed during this procedure. A total of 4 milligrams versed and 150 micrograms fentanyl were administered intravenously. The patient's level of consciousness and vital signs were monitored continuously by radiology nursing throughout the procedure under my direct supervision. Total monitored sedation time: 18 minutes FLUOROSCOPY TIME:  None COMPLICATIONS: None immediate. Estimated blood loss: <25 mL PROCEDURE: Informed written consent was obtained from the patient after a thorough discussion of the procedural risks, benefits and alternatives. All questions were addressed. Maximal Sterile Barrier Technique was utilized including caps, mask, sterile gowns, sterile gloves, sterile drape, hand hygiene and skin antiseptic. A timeout was performed prior to the initiation of the procedure. The patient was  positioned prone and non-contrast localization CT was performed of the pelvis to demonstrate the iliac marrow spaces. Maximal barrier sterile technique utilized including caps, mask, sterile gowns, sterile gloves, large sterile drape, hand hygiene, and betadine prep. Under sterile conditions and local anesthesia, an 11 gauge coaxial bone biopsy needle was advanced into the right iliac marrow space. Needle position was confirmed with CT imaging. Initially, bone marrow aspiration was performed. Next, the 11 gauge outer cannula was utilized to obtain a right iliac bone marrow core biopsy. Needle was removed. Hemostasis was obtained with compression. The patient tolerated the procedure well. Samples were prepared with the cytotechnologist. IMPRESSION: Technically successful CT-guided bone marrow aspiration and core biopsy of the right iliac bone. Electronically Signed   By: Jacqulynn Cadet M.D.   On: 04/03/2020 19:33   ECHOCARDIOGRAM COMPLETE  Result Date: 04/06/2020    ECHOCARDIOGRAM REPORT   Patient Name:   NIHARIKA SAVINO Depoy Date of Exam: 04/06/2020 Medical Rec #:  [id number]         Height:       64.0 in Accession #:    [id number]        Weight:       235.7 lb Date of Birth:  05-12-1975         BSA:          2.098 m Patient Age:    19 years          BP:           188/99 mmHg Patient Gender: F                 HR:           99 bpm. Exam Location:  Inpatient Procedure: 2D Echo, Cardiac Doppler and Color Doppler Indications:    Elevated Troponin  History:        Patient has no prior history of Echocardiogram examinations.  Sonographer:    Bernadene Person RDCS Referring Phys: [id number] [place]  1. Left ventricular ejection fraction, by estimation, is 60 to 65%. The left ventricle has normal function. The left ventricle has no regional wall motion abnormalities. Left ventricular diastolic parameters are consistent with Grade I diastolic dysfunction (impaired relaxation). Elevated left ventricular  end-diastolic pressure.  2. Right ventricular systolic function is normal. The right ventricular size is normal. There is mildly elevated pulmonary artery systolic pressure. The estimated right ventricular systolic pressure is 41.6 mmHg.  3. The mitral valve is normal in structure. Mild mitral valve regurgitation. No evidence of mitral stenosis.  4. The aortic valve is normal in structure. Aortic valve regurgitation is not visualized. No aortic stenosis is present.  5. The inferior vena cava is normal in size with greater than 50% respiratory variability, suggesting right atrial pressure of 3 mmHg. FINDINGS  Left Ventricle: Left ventricular ejection fraction, by estimation, is 60 to 65%. The left ventricle has normal function. The left ventricle has no regional wall motion abnormalities. The left ventricular internal cavity size was normal in size. There is  no left ventricular hypertrophy. Left ventricular diastolic parameters are consistent with Grade I diastolic dysfunction (impaired relaxation). Elevated left ventricular end-diastolic pressure. Right Ventricle: The right ventricular size is normal. No increase in right ventricular wall thickness. Right ventricular systolic function is normal. There is mildly elevated pulmonary artery systolic pressure. The tricuspid regurgitant velocity is 2.88  m/s, and with an assumed right atrial pressure of 3 mmHg, the estimated right ventricular systolic pressure is 33.8 mmHg. Left Atrium: Left atrial size was normal in size. Right Atrium: Right atrial size was normal in size. Pericardium: There is no evidence of pericardial effusion. Mitral Valve: The mitral valve is normal in structure. There is mild thickening of the mitral valve leaflet(s). Mild mitral valve regurgitation. No evidence of mitral valve stenosis. Tricuspid Valve: The tricuspid valve is normal in structure. Tricuspid valve regurgitation is mild . No evidence of tricuspid stenosis. Aortic Valve: The aortic  valve is normal in structure. Aortic valve regurgitation is not visualized. No aortic stenosis is present. Pulmonic Valve: The pulmonic valve was normal in structure. Pulmonic valve regurgitation is not visualized. No evidence of pulmonic stenosis. Aorta: The aortic root is normal in size and structure. Venous: The inferior vena cava is normal in size with greater than 50% respiratory variability, suggesting right atrial pressure of 3 mmHg. IAS/Shunts: No atrial level shunt detected by color flow Doppler.  LEFT VENTRICLE PLAX 2D LVIDd:         5.08 cm  Diastology LVIDs:         3.00 cm  LV e' medial:    0.06 cm/s LV PW:         0.96 cm  LV E/e' medial:  17.5 LV IVS:        0.87 cm  LV e' lateral:   0.08 cm/s LVOT diam:     2.20 cm  LV E/e' lateral: 13.0 LV SV:         81 LV SV Index:   38 LVOT Area:     3.80 cm  RIGHT VENTRICLE RV S prime:     9.11 cm/s TAPSE (M-mode): 2.0 cm LEFT ATRIUM         Index LA diam:    3.30 cm 1.57 cm/m  AORTIC VALVE LVOT Vmax:   124.00 cm/s LVOT Vmean:  85.100 cm/s LVOT VTI:    0.212 m  AORTA Ao Root diam: 3.30 cm Ao Asc diam:  3.20 cm MITRAL VALVE                TRICUSPID VALVE MV Area (PHT): 2.60 cm     TR Peak grad:   33.2 mmHg MV Decel Time: 292 msec     TR Vmax:        288.00 cm/s MV E velocity: 0.98 cm/s MV A velocity: 102.00 cm/s  SHUNTS MV E/A ratio:  0.01  Systemic VTI:  0.21 m                             Systemic Diam: 2.20 cm Ena Dawley MD Electronically signed by Ena Dawley MD Signature Date/Time: 04/06/2020/1:18:07 PM    Final    DG FEMUR PORT MIN 2 VIEWS LEFT  Result Date: 04/02/2020 CLINICAL DATA:  Bilateral thigh pain.  History of myeloma. EXAM: LEFT FEMUR PORTABLE 2 VIEWS COMPARISON:  Femur exam on bone survey earlier today. Included portion from CT earlier today. FINDINGS: Lucent lesion in the lesser trochanter was better appreciated on CT earlier today. There are multiple small lucencies involving the femoral head and intertrochanteric femur that  are not well seen by radiograph. No obvious destructive lesion in the femoral shaft. No evidence of pathologic fracture. IMPRESSION: Proximal femoral lesions including a lesion of the lesser trochanter, majority of these are better seen on CT earlier today. There is no evidence of acute femur fracture or large destructive lesion. Consider MRI for more detailed assessment. Electronically Signed   By: Keith Rake M.D.   On: 04/02/2020 21:21   DG FEMUR PORT, MIN 2 VIEWS RIGHT  Result Date: 04/02/2020 CLINICAL DATA:  Right thigh pain.  Myeloma. EXAM: RIGHT FEMUR PORTABLE 2 VIEW COMPARISON:  Included portion from bone survey earlier today. Included portion from pelvis CT earlier today. FINDINGS: Lucent lesion in the intertrochanteric femur on CT earlier today is only faintly visualized, this is at risk for pathologic fracture. There is some thinning of the lateral cortex of the greater trochanter. There are multiple additional small lucencies in the femoral head on CT that are not well seen by radiograph. Small lesion noted in the distal lateral aspect of the femur in the region of the physeal scar. No evidence of acute fracture. IMPRESSION: Relatively large lucent lesion in the intertrochanteric femur on CT earlier today is only faintly visualized, this is at risk for pathologic fracture. There are multiple additional lucencies in the femoral head on CT are not well seen by radiograph. There is a small lesion in the distal lateral femoral metaphysis. MRI may be of value for more detailed assessment. Electronically Signed   By: Keith Rake M.D.   On: 04/02/2020 21:23   CT CHEST ABDOMEN PELVIS WO CONTRAST  Result Date: 04/02/2020 CLINICAL DATA:  Thoracic and lumbar spine pain. Pathologic fracture with hypercalcemia. EXAM: CT CHEST, ABDOMEN AND PELVIS WITHOUT CONTRAST TECHNIQUE: Multidetector CT imaging of the chest, abdomen and pelvis was performed following the standard protocol without IV contrast.  COMPARISON:  Chest radiographs 04/02/2020. Thoracolumbar spine radiographs 03/07/2020. FINDINGS: CT CHEST FINDINGS Cardiovascular: Normal caliber of the thoracic aorta. Normal heart size. No pericardial effusion. Mediastinum/Nodes: No enlarged axillary, mediastinal, or hilar lymph nodes are identified with hilar assessment limited in the absence of IV contrast. Unremarkable visualized thyroid and esophagus. Lungs/Pleura: No pleural effusion or pneumothorax. Patchy and hazy ground-glass opacities in the left greater than right upper lobes. No parenchymal lung mass. Musculoskeletal: Widespread lytic bone lesions throughout the visualized skeleton including a destructive 5 cm lesion involving the posterior right third rib and a 3 cm destructive lesion involving the posteromedial right sixth rib. Smaller destructive lesions with nondisplaced pathologic fractures of the posterolateral right fourth and posterior left fourth ribs. Large lesion involving much of the T1 vertebral body with disruption of the posterior vertebral body cortex but no gross epidural tumor. Pathologic vertebral body fractures with mild vertebral body  height loss at T7 and T8 and moderate height loss at T12. CT ABDOMEN PELVIS FINDINGS Hepatobiliary: No focal liver abnormality is seen. No gallstones, gallbladder wall thickening, or biliary dilatation. Pancreas: Unremarkable. Spleen: Unremarkable. Adrenals/Urinary Tract: Unremarkable adrenal glands. No evidence of renal mass, calculi, or hydronephrosis. Unremarkable bladder. Stomach/Bowel: The stomach is moderately distended by fluid and is otherwise unremarkable. There is no evidence of bowel obstruction or inflammation. The appendix is unremarkable. Vascular/Lymphatic: Normal caliber of the abdominal aorta. No enlarged lymph nodes. Reproductive: Grossly unremarkable uterus and ovaries. Other: No ascites. Musculoskeletal: Widespread lytic bone lesions throughout the spine and pelvis with the lumbar  spine evaluated in detail on the separate dedicated spine CT. IMPRESSION: 1. Widespread lytic lesions throughout the visualized skeleton consistent with metastatic disease or multiple myeloma. No definite primary malignancy identified in the chest, abdomen, or pelvis. 2. Nonspecific pulmonary ground-glass opacities in the left greater than right upper lobes, likely infectious or inflammatory. Electronically Signed   By: Logan Bores M.D.   On: 04/02/2020 13:12    ASSESSMENT & PLAN:   45 year old very pleasant lady with  1) likely newly diagnosed multiple myeloma with extensive bone lesions 2) hypercalcemia due to multiple myeloma-now resolved with IV fluids, calcitonin, pamidronate. 3) anemia due to multiple myeloma becoming more apparent as the patient's hemoconcentration due to dehydration has been corrected.   4) acute renal failure related to dehydration hypercalcemia and multiple myeloma.  Renal function is improving with IV fluids and improving calcium levels 5) multilevel pathologic fractures in the spine most symptomatic at L4-5 with some epidural tumor and left lower extremity radicular pain 6) severe constipation related to chronic constipation plus hypercalcemia plus opiates 7) hyponatremia related to dehydration improving with IV fluids  PLAN -Continue IV fluids as per hospital medicine team -Echo done today with normal ejection fraction of 60 to 65% with grade 1 diastolic dysfunction -Increased fentanyl patch to 25 mcg/h with continued oxycodone as needed -Continue Neurontin for left lower extremity radicular pain@ 300 mg twice daily  -Continue current bowel regimen. -Started on vitamin D 2000 units daily for vitamin D deficiency -Hemoglobin stable transfuse as needed for hemoglobin less than 7.5. -Continue radiation under the care of Dr. Lisbeth Renshaw. -Dexamethasone 4 mg p.o. daily while on radiation therapy. -Appreciate excellent hospital medicine care. -Bone marrow biopsy results  and myeloma panel reviewed -We will likely need outpatient PET CT scan -Oncology will continue to follow closely  Sullivan Lone MD MS Hematology/Oncology Physician [facility]  . The total time spent in the appointment was 35 minutes and more than 50% was on counseling and direct patient cares.

## 2020-04-06 NOTE — Progress Notes (Signed)
  Echocardiogram 2D Echocardiogram has been performed.  Stacey Montoya 04/06/2020, 1:00 PM

## 2020-04-06 NOTE — Progress Notes (Signed)
PROGRESS NOTE  Stacey Montoya ZDG:[id number] DOB: 02-18-1975 DOA: 04/02/2020 PCP: Celene Squibb, MD  HPI/Recap of past 24 hours: This is a 45 year old female with history of smoking but no other medical problems, comes to the hospital with severe back pain.  This has been going on and progressively getting worse over the last couple of months.  She is seeing a chiropractor as an outpatient, also saw Dr Kathyrn Sheriff with neurosurgery due to a T12 compression fracture, treated conservatively.  She has been having worsening low back pain as well as left hip pain and eventually could not take it anymore and came to the hospital.  She was found to be hypercalcemic with a calcium of 14.5, and a CT scan showed widespread lytic lesions consistent with metastatic disease or multiple myeloma.  Oncology consulted.  She received pamidronate on 04/03/2020 for hypercalcemia of malignancy and aggressive IV fluid with improvement.  She underwent a bone marrow biopsy on 04/03/2020 by IR, Dr. Chevis Pretty.  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  Transferred to [facility] hospital to start urgent radiation therapy.  First radiation therapy planned for 04/04/2020, 4PM.  04/06/20: She was seen and examined at bedside.  Breathing appears improved.  Elevated troponin and D-dimer are downtrending.  2D echo is pending.   Assessment/Plan: Principal Problem:   Hypercalcemia of malignancy Active Problems:   Lytic bone lesions on xray   Hypertensive urgency   Normocytic anemia   Acute renal failure (ARF) (HCC)   Hyponatremia   Multiple myeloma (HCC)   Drug-induced constipation   Neoplasm related pain  Severe hypercalcemia of malignancy, improving. Presented with calcium 14.5 and widespread lytic lesions consistent with metastatic disease or multiple myeloma. She received pamidronate on 04/03/2020 as  well as aggressive IV fluid hydration. Hypercalcemia is much improved. IV fluid was stopped on 04/04/2020 due to dyspnea, acute hypoxemia, and pulmonary edema seen on chest x-ray. Serum calcium continues to downtrend, 9.9. 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 Neurosurgery and Radiation oncology consulted Started first round of radiation on 04/04/2020.  -Imaging of the hip on the right side showed large lucent lesion in the intertrochanteric femur, increased risk for pathological fracture.  Ortho recommend onc ortho evaluation at [facility], radiation oncology and neurosurgery's assistance.  Resolved acute hypoxic respiratory failure secondary to acute pulmonary edema Likely from IV fluid Personally reviewed chest x-ray done on 04/04/2020 which shows increasing pulmonary vascularity suggestive of pulmonary edema. Held off IV fluid on 02/01/2021 evening Received 2 doses of IV Lasix on 04/04/2020 and 04/05/2020 Maintain O2 saturation greater than 92%. Continue to monitor.  Elevated troponin suspect demand ischemia in the setting of sinus tachycardia No evidence of acute ischemia on EKG. Troponin downtrended, peaked at 72. 2D echo is pending.  Elevated D-dimer in the setting of pleuritic pain Acute hypoxia is resolved. D-dimer is downtrending.  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 Creatinine downtrending, 1.35 from 1.70 Continue to avoid nephrotoxic agents Continue to monitor urine  output Net I&O -9.4 L  Essential  hypertension BP is not at goal Increased dose of metoprolol to 50 mg twice daily. Continue to monitor vital signs.  Hypovolemic hyponatremia, likely secondary to dehydration from hypercalcemia Serum sodium 137> 133.  Resolved normal anion gap metabolic acidosis likely secondary to renal insufficiency. Received IV fluid hydration.  Worsening isolated elevated alkaline phosphatase, suspect related to malignancy AST ALT, T bilirubin normal Continue to monitor  Resolved post repletion: Hypokalemia Serum potassium 3.1>> 3.5>> 3.8.  Resolved leukocytosis, suspect reactive in the setting of steroid use WBC 9.7 from 15.9 Nonseptic appearing UA 04/02/2020 negative for pyuria No acute cardiopulmonary disease on 04/02/2020.  Anemia of chronic disease with presumed malignancy Hemoglobin stable at 9.0 from 7.4. No overt bleeding Continue to monitor    Code Status: Full code.  Family Communication: Updated husband at bedside on 04/04/2020.  Disposition Plan: Likely will discharge to home once medical oncology, radiation oncology and neurosurgery sign off.   Consultants:  Medical oncology  Radiation oncology  Neurosurgery.  Procedures:  Radiation planned on 04/04/2020.  Antimicrobials:  None.  DVT prophylaxis: Subcu heparin 3 times daily.  Status is: Inpatient   Dispo:  Patient From: Home  Planned Disposition: Home  Expected discharge date: 04/09/2020  Medically stable for discharge: No, ongoing management of newly diagnosed lytic lesions and presumed stage IV malignancy.          Objective: Vitals:   04/05/20 1805 04/05/20 2005 04/06/20 0535 04/06/20 1503  BP: (!) 160/97 (!) 156/96 (!) 188/99 (!) 143/93  Pulse: (!) 105 (!) 110 97 96  Resp: 18 18  18   Temp: 97.6 F (36.4 C) 97.6 F (36.4 C) 97.8 F (36.6 C) 98.7 F (37.1 C)  TempSrc: Oral Oral Oral Oral  SpO2: 93% 93% 94% 95%  Weight:      Height:        Intake/Output Summary (Last 24 hours) at  04/06/2020 1654 Last data filed at 04/06/2020 1300 Gross per 24 hour  Intake 600 ml  Output 3725 ml  Net -3125 ml   Filed Weights   04/02/20 1600 04/02/20 2230 04/03/20 2326  Weight: 108.9 kg 108.9 kg 106.9 kg    Exam:  . General: 45 y.o. year-old female pleasant well-developed well-nourished in no acute distress.  Alert and oriented x3. . Cardiovascular: Regular rate and rhythm no rubs or gallops. Marland Kitchen Respiratory: Clear to auscultation no wheeze no rales.  Good inspiratory effort. . Abdomen: Soft nontender normal bowel sounds present. . Musculoskeletal: Trace lower extremity edema bilaterally. . Skin: No ulcerative lesions noted. Marland Kitchen Psychiatry: Mood is appropriate for condition and setting.   Data Reviewed: CBC: Recent Labs  Lab 04/02/20 0843 04/03/20 0432 04/04/20 0958 04/05/20 0503 04/06/20 0607  WBC 14.5* 11.4* 15.9* 14.8* 9.7  NEUTROABS  --   --  14.5*  --   --   HGB 9.7* 8.3* 7.4* 8.9* 9.0*  HCT 29.5* 25.2* 23.7* 27.4* 28.1*  MCV 96.4 95.1 101.3* 97.2 97.6  PLT 387 316 290 344 672   Basic Metabolic Panel: Recent Labs  Lab 04/02/20 1850 04/03/20 0432 04/04/20 0958 04/05/20 0503 04/06/20 0607  NA 127* 131* 132* 137 133*  K 3.7 3.5 3.1* 3.5 3.8  CL 97* 104 109 109 105  CO2 25 21* 19* 22 22  GLUCOSE 98 130* 109* 99 89  BUN 49* 41* 45* 50* 51*  CREATININE 3.02* 2.62* 1.70* 1.58* 1.35*  CALCIUM 13.4* 12.1* 9.9 10.1 9.9  MG  --   --   --  1.7  --   PHOS  --   --   --  2.6  --    GFR: Estimated Creatinine Clearance: 64.8 mL/min (A) (by C-G formula based on SCr of 1.35 mg/dL (H)). Liver Function Tests: Recent Labs  Lab 04/02/20 1800 04/03/20 0432 04/04/20 0958 04/05/20 0503 04/06/20 0607  AST 18 16 21 17  11*  ALT 27 26 28 26 26   ALKPHOS 218* 219* 222* 240* 266*  BILITOT 0.6 0.4 0.6 0.5 0.6  PROT 9.9* 9.9* 9.2* 9.1* 8.8*  ALBUMIN 2.3* 2.2* 2.3* 2.6* 2.5*   No results for input(s): LIPASE, AMYLASE in the last 168 hours. No results for input(s):  AMMONIA in the last 168 hours. Coagulation Profile: No results for input(s): INR, PROTIME in the last 168 hours. Cardiac Enzymes: No results for input(s): CKTOTAL, CKMB, CKMBINDEX, TROPONINI in the last 168 hours. BNP (last 3 results) No results for input(s): PROBNP in the last 8760 hours. HbA1C: No results for input(s): HGBA1C in the last 72 hours. CBG: No results for input(s): GLUCAP in the last 168 hours. Lipid Profile: No results for input(s): CHOL, HDL, LDLCALC, TRIG, CHOLHDL, LDLDIRECT in the last 72 hours. Thyroid Function Tests: No results for input(s): TSH, T4TOTAL, FREET4, T3FREE, THYROIDAB in the last 72 hours. Anemia Panel: No results for input(s): VITAMINB12, FOLATE, FERRITIN, TIBC, IRON, RETICCTPCT in the last 72 hours. Urine analysis:    Component Value Date/Time   COLORURINE YELLOW 04/02/2020 [place] 04/02/2020 1227   LABSPEC 1.011 04/02/2020 1227   PHURINE 5.0 04/02/2020 1227   GLUCOSEU NEGATIVE 04/02/2020 1227   HGBUR LARGE (A) 04/02/2020 1227   BILIRUBINUR NEGATIVE 04/02/2020 [place] 04/02/2020 1227   PROTEINUR 30 (A) 04/02/2020 1227   NITRITE NEGATIVE 04/02/2020 1227   LEUKOCYTESUR NEGATIVE 04/02/2020 1227   Sepsis Labs: @LABRCNTIP (procalcitonin:4,lacticidven:4)  ) Recent Results (from the past 240 hour(s))  Resp Panel by RT-PCR (Flu A&B, Covid) Nasopharyngeal Swab     Status: None   Collection Time: 04/02/20 11:12 AM   Specimen: Nasopharyngeal Swab; Nasopharyngeal(NP) swabs in vial transport medium  Result Value Ref Range Status   SARS Coronavirus 2 by RT PCR NEGATIVE NEGATIVE Final    Comment: (NOTE) SARS-CoV-2 target nucleic acids are NOT DETECTED.  The SARS-CoV-2 RNA is generally detectable in upper respiratory specimens during the acute phase of infection. The lowest concentration of SARS-CoV-2 viral copies this assay can detect is 138 copies/mL. A negative result does not preclude SARS-Cov-2 infection and  should not be used as the sole basis for treatment or other patient management decisions. A negative result may occur with  improper specimen collection/handling, submission of specimen other than nasopharyngeal swab, presence of viral mutation(s) within the areas targeted by this assay, and inadequate number of viral copies(<138 copies/mL). A negative result must be combined with clinical observations, patient history, and epidemiological information. The expected result is Negative.  Fact Sheet for Patients:  EntrepreneurPulse.com.au  Fact Sheet for Healthcare Providers:  IncredibleEmployment.be  This test is no t yet approved or cleared by the [place] FDA and  has been authorized for detection and/or diagnosis of SARS-CoV-2 by FDA under an Emergency Use Authorization (EUA). This EUA will remain  in effect (meaning this test can be used) for the duration of the COVID-19 declaration under Section 564(b)(1) of the Act, 21 U.S.C.section 360bbb-3(b)(1), unless the authorization is terminated  or revoked sooner.       Influenza A by PCR NEGATIVE NEGATIVE Final  Influenza B by PCR NEGATIVE NEGATIVE Final    Comment: (NOTE) The Xpert Xpress SARS-CoV-2/FLU/RSV plus assay is intended as an aid in the diagnosis of influenza from Nasopharyngeal swab specimens and should not be used as a sole basis for treatment. Nasal washings and aspirates are unacceptable for Xpert Xpress SARS-CoV-2/FLU/RSV testing.  Fact Sheet for Patients: EntrepreneurPulse.com.au  Fact Sheet for Healthcare Providers: IncredibleEmployment.be  This test is not yet approved or cleared by the [place] FDA and has been authorized for detection and/or diagnosis of SARS-CoV-2 by FDA under an Emergency Use Authorization (EUA). This EUA will remain in effect (meaning this test can be used) for the duration of the COVID-19 declaration  under Section 564(b)(1) of the Act, 21 U.S.C. section 360bbb-3(b)(1), unless the authorization is terminated or revoked.  Performed at [facility], [place] [address], [place], [place] [address]   MRSA PCR Screening     Status: None   Collection Time: 04/02/20 11:01 PM   Specimen: Nasal Mucosa; Nasopharyngeal  Result Value Ref Range Status   MRSA by PCR NEGATIVE NEGATIVE Final    Comment:        The GeneXpert MRSA Assay (FDA approved for NASAL specimens only), is one component of a comprehensive MRSA colonization surveillance program. It is not intended to diagnose MRSA infection nor to guide or monitor treatment for MRSA infections. Performed at [facility], [place] [address] [name], [place] [address]       Studies: ECHOCARDIOGRAM COMPLETE  Result Date: 04/06/2020    ECHOCARDIOGRAM REPORT   Patient Name:   Stacey Montoya Coates Date of Exam: 04/06/2020 Medical Rec #:  [id number]         Height:       64.0 in Accession #:    [id number]        Weight:       235.7 lb Date of Birth:  01-05-1976         BSA:          2.098 m Patient Age:    71 years          BP:           188/99 mmHg Patient Gender: F                 HR:           99 bpm. Exam Location:  Inpatient Procedure: 2D Echo, Cardiac Doppler and Color Doppler Indications:    Elevated Troponin  History:        Patient has no prior history of Echocardiogram examinations.  Sonographer:    Bernadene Person RDCS Referring Phys: [id number] [place]  1. Left ventricular ejection fraction, by estimation, is 60 to 65%. The left ventricle has normal function. The left ventricle has no regional wall motion abnormalities. Left ventricular diastolic parameters are consistent with Grade I diastolic dysfunction (impaired relaxation). Elevated left ventricular end-diastolic pressure.  2. Right ventricular systolic function is normal. The right ventricular size is normal. There is mildly elevated pulmonary artery systolic pressure.  The estimated right ventricular systolic pressure is 40.0 mmHg.  3. The mitral valve is normal in structure. Mild mitral valve regurgitation. No evidence of mitral stenosis.  4. The aortic valve is normal in structure. Aortic valve regurgitation is not visualized. No aortic stenosis is present.  5. The inferior vena cava is normal in size with greater than 50% respiratory variability, suggesting right atrial pressure of 3 mmHg. FINDINGS  Left Ventricle: Left ventricular ejection fraction, by estimation, is 60 to 65%. The left ventricle has normal function. The left ventricle has no regional wall motion abnormalities. The left ventricular internal cavity size was normal in size. There is  no left ventricular hypertrophy. Left ventricular diastolic parameters are consistent with Grade I diastolic dysfunction (impaired relaxation). Elevated left ventricular end-diastolic pressure. Right Ventricle: The right ventricular size is normal. No increase in right ventricular wall thickness. Right ventricular systolic function is normal. There is mildly elevated pulmonary artery systolic pressure. The tricuspid regurgitant velocity is 2.88  m/s, and with an assumed right atrial pressure of 3 mmHg, the estimated right ventricular systolic pressure is 74.2 mmHg. Left Atrium: Left atrial size was normal in size. Right Atrium: Right atrial size was normal in size. Pericardium: There is no evidence of pericardial effusion. Mitral Valve: The mitral valve is normal in structure. There is mild thickening of the mitral valve leaflet(s). Mild mitral valve regurgitation. No evidence of mitral valve stenosis. Tricuspid Valve: The tricuspid valve is normal in structure. Tricuspid valve regurgitation is mild . No evidence of tricuspid stenosis. Aortic Valve: The aortic valve is normal in structure. Aortic valve regurgitation is not visualized. No aortic stenosis is present. Pulmonic Valve: The pulmonic valve was normal in structure. Pulmonic  valve regurgitation is not visualized. No evidence of pulmonic stenosis. Aorta: The aortic root is normal in size and structure. Venous: The inferior vena cava is normal in size with greater than 50% respiratory variability, suggesting right atrial pressure of 3 mmHg. IAS/Shunts: No atrial level shunt detected by color flow Doppler.  LEFT VENTRICLE PLAX 2D LVIDd:         5.08 cm  Diastology LVIDs:         3.00 cm  LV e' medial:    0.06 cm/s LV PW:         0.96 cm  LV E/e' medial:  17.5 LV IVS:        0.87 cm  LV e' lateral:   0.08 cm/s LVOT diam:     2.20 cm  LV E/e' lateral: 13.0 LV SV:         81 LV SV Index:   38 LVOT Area:     3.80 cm  RIGHT VENTRICLE RV S prime:     9.11 cm/s TAPSE (M-mode): 2.0 cm LEFT ATRIUM         Index LA diam:    3.30 cm 1.57 cm/m  AORTIC VALVE LVOT Vmax:   124.00 cm/s LVOT Vmean:  85.100 cm/s LVOT VTI:    0.212 m  AORTA Ao Root diam: 3.30 cm Ao Asc diam:  3.20 cm MITRAL VALVE                TRICUSPID VALVE MV Area (PHT): 2.60 cm     TR Peak grad:   33.2 mmHg MV Decel Time: 292 msec     TR Vmax:        288.00 cm/s MV E velocity: 0.98 cm/s MV A velocity: 102.00 cm/s  SHUNTS MV E/A ratio:  0.01         Systemic VTI:  0.21 m                             Systemic Diam: 2.20 cm Ena Dawley MD Electronically signed by Ena Dawley MD Signature Date/Time: 04/06/2020/1:18:07 PM    Final     Scheduled Meds: . cholecalciferol  2,000 Units Oral Daily  . fentaNYL  1 patch Transdermal Q72H  . gabapentin  300 mg Oral BID  . heparin  5,000 Units Subcutaneous Q8H  . metoprolol tartrate  50 mg Oral BID  . nicotine  21 mg Transdermal Daily  . polyethylene glycol  17 g Oral Daily  . senna-docusate  2 tablet Oral BID  . sodium chloride flush  3 mL Intravenous Q12H    Continuous Infusions:    LOS: 4 days     Kayleen Memos, MD [facility] Pager [phone]  If 7PM-7AM, please contact night-coverage [email or web address] Password [facility] 04/06/2020, 4:54 PM

## 2020-04-07 LAB — COMPREHENSIVE METABOLIC PANEL
ALT: 31 U/L (ref 0–44)
AST: 13 U/L — ABNORMAL LOW (ref 15–41)
Albumin: 2.6 g/dL — ABNORMAL LOW (ref 3.5–5.0)
Alkaline Phosphatase: 327 U/L — ABNORMAL HIGH (ref 38–126)
Anion gap: 6 (ref 5–15)
BUN: 46 mg/dL — ABNORMAL HIGH (ref 6–20)
CO2: 22 mmol/L (ref 22–32)
Calcium: 9.3 mg/dL (ref 8.9–10.3)
Chloride: 104 mmol/L (ref 98–111)
Creatinine, Ser: 1.22 mg/dL — ABNORMAL HIGH (ref 0.44–1.00)
GFR, Estimated: 57 mL/min — ABNORMAL LOW (ref >60)
Glucose, Bld: 94 mg/dL (ref 70–99)
Potassium: 3.4 mmol/L — ABNORMAL LOW (ref 3.5–5.1)
Sodium: 132 mmol/L — ABNORMAL LOW (ref 135–145)
Total Bilirubin: 0.7 mg/dL (ref 0.3–1.2)
Total Protein: 8.7 g/dL — ABNORMAL HIGH (ref 6.5–8.1)

## 2020-04-07 LAB — CBC
HCT: 29.1 % — ABNORMAL LOW (ref 36.0–46.0)
Hemoglobin: 9.5 g/dL — ABNORMAL LOW (ref 12.0–15.0)
MCH: 31.6 pg (ref 26.0–34.0)
MCHC: 32.6 g/dL (ref 30.0–36.0)
MCV: 96.7 fL (ref 80.0–100.0)
Platelets: 254 10*3/uL (ref 150–400)
RBC: 3.01 MIL/uL — ABNORMAL LOW (ref 3.87–5.11)
RDW: 15.4 % (ref 11.5–15.5)
WBC: 6.3 10*3/uL (ref 4.0–10.5)
nRBC: 0 % (ref 0.0–0.2)

## 2020-04-07 MED ORDER — POTASSIUM CHLORIDE CRYS ER 20 MEQ PO TBCR
40.0000 meq | EXTENDED_RELEASE_TABLET | Freq: Once | ORAL | Status: AC
  Administered 2020-04-07: 40 meq via ORAL
  Filled 2020-04-07: qty 2

## 2020-04-07 MED ORDER — AMLODIPINE BESYLATE 5 MG PO TABS
5.0000 mg | ORAL_TABLET | Freq: Every day | ORAL | Status: DC
  Administered 2020-04-07 – 2020-04-09 (×3): 5 mg via ORAL
  Filled 2020-04-07 (×3): qty 1

## 2020-04-07 NOTE — Progress Notes (Signed)
PROGRESS NOTE  Stacey Montoya DSK:[id number] DOB: [birth year] DOA: 04/02/2020 PCP: Celene Squibb, MD  HPI/Recap of past 24 hours: This is a 45 year old female with history of smoking but no other medical problems, comes to the hospital with severe back pain.  This has been going on and progressively getting worse over the last couple of months.  She is seeing a chiropractor as an outpatient, also saw Dr Kathyrn Sheriff with neurosurgery due to a T12 compression fracture, treated conservatively.  She has been having worsening low back pain as well as left hip pain and eventually could not take it anymore and came to the hospital.  She was found to be hypercalcemic with a calcium of 14.5, and a CT scan showed widespread lytic lesions consistent with metastatic disease or multiple myeloma.  Oncology consulted.  She received pamidronate on 04/03/2020 for hypercalcemia of malignancy and aggressive IV fluid with improvement.  She underwent a bone marrow biopsy on 04/03/2020 by IR, Dr. Chevis Pretty.  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  Transferred to [facility] long hospital to start urgent radiation therapy.  First radiation therapy planned for 04/04/2020, 4PM.  04/07/20: Seen and examined at bedside.  Reports pain in her left hip and left knee with ambulation, 5/10 and improves with rest.  Fentanyl patch dose increased by oncology.  Bone marrow biopsy confirmed multiple myeloma.  Assessment/Plan: Principal Problem:   Hypercalcemia of malignancy Active Problems:   Lytic bone lesions on xray   Hypertensive urgency   Normocytic anemia   Acute renal failure (ARF) (HCC)   Hyponatremia   Multiple myeloma (HCC)   Drug-induced constipation   Neoplasm related pain  Resolved severe hypercalcemia of malignancy, improving. Presented with calcium 14.5 corrected for albumin 15.9.  And widespread  lytic lesions consistent with metastatic disease or multiple myeloma. She received pamidronate on 04/03/2020 as well as aggressive IV fluid hydration. Hypercalcemia resolved, 9.3. IV fluid was stopped on 04/04/2020 due to dyspnea, acute hypoxemia, and pulmonary edema seen on chest x-ray. Continue to monitor serum calcium with daily BMPs  Newly diagnosed multiple myeloma Post bone marrow biopsy on 04/03/2020, which confirmed multiple myeloma. Myeloma panel shows M spike of 4.1. 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  Started first round of radiation on 04/04/2020.  -Imaging of the hip on the right side showed large lucent lesion in the intertrochanteric femur, increased risk for pathological fracture.   Started on p.o. Decadron 4 mg daily while on radiation by oncology.  Resolved acute hypoxic respiratory failure secondary to acute pulmonary edema Likely from IV fluid Personally reviewed chest x-ray done on 04/04/2020 which shows increasing pulmonary vascularity suggestive of pulmonary edema. Held off IV fluid on 02/01/2021 evening Received 2 doses of IV Lasix on 04/04/2020 and 04/05/2020 Maintain O2 saturation greater than 92%. Continue to monitor.  Elevated troponin suspect demand ischemia in the setting of sinus tachycardia No evidence of acute ischemia on EKG. Troponin downtrended, peaked at 41. 2D echo done on 2020-04-06 showed normal LVEF 60 to 65% with grade 1 diastolic dysfunction.  Elevated D-dimer in the setting of pleuritic pain Acute hypoxia is resolved. D-dimer is downtrending.  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 Creatinine downtrending, 1.35 from 1.70 Continue to avoid nephrotoxic  agents Continue to monitor urine output Net I&O -12.4 L.  Essential hypertension BP is not at goal. Started Norvasc 5 mg daily Continue metoprolol Tartrate 50 mg twice daily. Continue to closely monitor vital signs.  Hypovolemic hyponatremia, likely secondary to dehydration from hypercalcemia Serum sodium 137> 133> 132.  Hyponatremia Repleted Recheck in the morning.  Resolved normal anion gap metabolic acidosis likely secondary to renal insufficiency. Received IV fluid hydration.  Worsening isolated elevated alkaline phosphatase, suspect related to lytic lesions from multiple myeloma. AST ALT, T bilirubin normal Continue to monitor  Resolved leukocytosis, suspect reactive in the setting of steroid use WBC 6.3 from 9.7 from 15.9 Nonseptic appearing UA 04/02/2020 negative for pyuria No acute cardiopulmonary disease on 04/02/2020.  Improving anemia of chronic disease with presumed malignancy Hemoglobin is uptrending 9.5 from 9.0 from 7.4. No overt bleeding. Continue to monitor  Ambulatory dysfunction in the setting of multiple myeloma PT OT to assess Fall precautions due to multiple lytic lesions increasing risks of pathological fractures.    Code Status: Full code.  Family Communication: Updated husband at bedside on 04/04/2020.  Disposition Plan: Likely will discharge to home once medical oncology, radiation oncology and neurosurgery sign off.   Consultants:  Medical oncology  Radiation oncology  Neurosurgery.  Procedures:  Radiation planned on 04/04/2020.  Antimicrobials:  None.  DVT prophylaxis: Subcu heparin 3 times daily.  Status is: Inpatient   Dispo:  Patient From: Home  Planned Disposition: Home  Expected discharge date: 04/09/2020  Medically stable for discharge: No, ongoing management of newly diagnosed multiple myeloma.          Objective: Vitals:   04/06/20 1503 04/06/20 2118 04/07/20 0544 04/07/20 0624  BP: (!) 143/93 (!)  157/96 (!) 167/98 (!) 158/82  Pulse: 96 (!) 103 (!) 102   Resp: 18 20 18    Temp: 98.7 F (37.1 C) 98 F (36.7 C) 98.5 F (36.9 C)   TempSrc: Oral Oral Oral   SpO2: 95% 97% 93%   Weight:      Height:        Intake/Output Summary (Last 24 hours) at 04/07/2020 1436 Last data filed at 04/07/2020 0900 Gross per 24 hour  Intake 240 ml  Output 3300 ml  Net -3060 ml   Filed Weights   04/02/20 1600 04/02/20 2230 04/03/20 2326  Weight: 108.9 kg 108.9 kg 106.9 kg    Exam:  . General: 45 y.o. year-old female pleasant well-developed well-nourished no distress.  Alert oriented x3.   . Cardiovascular: Regular rate and rhythm no rubs or gallops.   Marland Kitchen Respiratory: Clear to auscultation no wheezing . Abdomen: Soft nontender Normal Bowel Sounds Present. . Musculoskeletal: Trace lower extremity edema bilaterally.   . Skin: No ulcerative lesions noted. Marland Kitchen Psychiatry: Mood is appropriate for condition and setting.   Data Reviewed: CBC: Recent Labs  Lab 04/03/20 0432 04/04/20 0958 04/05/20 0503 04/06/20 0607 04/07/20 0606  WBC 11.4* 15.9* 14.8* 9.7 6.3  NEUTROABS  --  14.5*  --   --   --   HGB 8.3* 7.4* 8.9* 9.0* 9.5*  HCT 25.2* 23.7* 27.4* 28.1* 29.1*  MCV 95.1 101.3* 97.2 97.6 96.7  PLT 316 290 344 253 867   Basic Metabolic Panel: Recent Labs  Lab 04/03/20 0432 04/04/20 0958 04/05/20 0503 04/06/20 0607 04/07/20 0606  NA 131* 132* 137 133* 132*  K 3.5 3.1* 3.5 3.8  3.4*  CL 104 109 109 105 104  CO2 21* 19* 22 22 22   GLUCOSE 130* 109* 99 89 94  BUN 41* 45* 50* 51* 46*  CREATININE 2.62* 1.70* 1.58* 1.35* 1.22*  CALCIUM 12.1* 9.9 10.1 9.9 9.3  MG  --   --  1.7  --   --   PHOS  --   --  2.6  --   --    GFR: Estimated Creatinine Clearance: 71.7 mL/min (A) (by C-G formula based on SCr of 1.22 mg/dL (H)). Liver Function Tests: Recent Labs  Lab 04/03/20 0432 04/04/20 0958 04/05/20 0503 04/06/20 0607 04/07/20 0606  AST 16 21 17  11* 13*  ALT 26 28 26 26 31   ALKPHOS 219*  222* 240* 266* 327*  BILITOT 0.4 0.6 0.5 0.6 0.7  PROT 9.9* 9.2* 9.1* 8.8* 8.7*  ALBUMIN 2.2* 2.3* 2.6* 2.5* 2.6*   No results for input(s): LIPASE, AMYLASE in the last 168 hours. No results for input(s): AMMONIA in the last 168 hours. Coagulation Profile: No results for input(s): INR, PROTIME in the last 168 hours. Cardiac Enzymes: No results for input(s): CKTOTAL, CKMB, CKMBINDEX, TROPONINI in the last 168 hours. BNP (last 3 results) No results for input(s): PROBNP in the last 8760 hours. HbA1C: No results for input(s): HGBA1C in the last 72 hours. CBG: No results for input(s): GLUCAP in the last 168 hours. Lipid Profile: No results for input(s): CHOL, HDL, LDLCALC, TRIG, CHOLHDL, LDLDIRECT in the last 72 hours. Thyroid Function Tests: No results for input(s): TSH, T4TOTAL, FREET4, T3FREE, THYROIDAB in the last 72 hours. Anemia Panel: No results for input(s): VITAMINB12, FOLATE, FERRITIN, TIBC, IRON, RETICCTPCT in the last 72 hours. Urine analysis:    Component Value Date/Time   COLORURINE YELLOW 04/02/2020 [place] 04/02/2020 1227   LABSPEC 1.011 04/02/2020 1227   PHURINE 5.0 04/02/2020 1227   GLUCOSEU NEGATIVE 04/02/2020 1227   HGBUR LARGE (A) 04/02/2020 1227   BILIRUBINUR NEGATIVE 04/02/2020 [place] 04/02/2020 1227   PROTEINUR 30 (A) 04/02/2020 1227   NITRITE NEGATIVE 04/02/2020 1227   LEUKOCYTESUR NEGATIVE 04/02/2020 1227   Sepsis Labs: @LABRCNTIP (procalcitonin:4,lacticidven:4)  ) Recent Results (from the past 240 hour(s))  Resp Panel by RT-PCR (Flu A&B, Covid) Nasopharyngeal Swab     Status: None   Collection Time: 04/02/20 11:12 AM   Specimen: Nasopharyngeal Swab; Nasopharyngeal(NP) swabs in vial transport medium  Result Value Ref Range Status   SARS Coronavirus 2 by RT PCR NEGATIVE NEGATIVE Final    Comment: (NOTE) SARS-CoV-2 target nucleic acids are NOT DETECTED.  The SARS-CoV-2 RNA is generally detectable in upper  respiratory specimens during the acute phase of infection. The lowest concentration of SARS-CoV-2 viral copies this assay can detect is 138 copies/mL. A negative result does not preclude SARS-Cov-2 infection and should not be used as the sole basis for treatment or other patient management decisions. A negative result may occur with  improper specimen collection/handling, submission of specimen other than nasopharyngeal swab, presence of viral mutation(s) within the areas targeted by this assay, and inadequate number of viral copies(<138 copies/mL). A negative result must be combined with clinical observations, patient history, and epidemiological information. The expected result is Negative.  Fact Sheet for Patients:  EntrepreneurPulse.com.au  Fact Sheet for Healthcare Providers:  IncredibleEmployment.be  This test is no t yet approved or cleared by the [place] FDA and  has been authorized for detection and/or diagnosis of SARS-CoV-2 by FDA under an Emergency Use  Authorization (EUA). This EUA will remain  in effect (meaning this test can be used) for the duration of the COVID-19 declaration under Section 564(b)(1) of the Act, 21 U.S.C.section 360bbb-3(b)(1), unless the authorization is terminated  or revoked sooner.       Influenza A by PCR NEGATIVE NEGATIVE Final   Influenza B by PCR NEGATIVE NEGATIVE Final    Comment: (NOTE) The Xpert Xpress SARS-CoV-2/FLU/RSV plus assay is intended as an aid in the diagnosis of influenza from Nasopharyngeal swab specimens and should not be used as a sole basis for treatment. Nasal washings and aspirates are unacceptable for Xpert Xpress SARS-CoV-2/FLU/RSV testing.  Fact Sheet for Patients: EntrepreneurPulse.com.au  Fact Sheet for Healthcare Providers: IncredibleEmployment.be  This test is not yet approved or cleared by the [place] FDA and has been  authorized for detection and/or diagnosis of SARS-CoV-2 by FDA under an Emergency Use Authorization (EUA). This EUA will remain in effect (meaning this test can be used) for the duration of the COVID-19 declaration under Section 564(b)(1) of the Act, 21 U.S.C. section 360bbb-3(b)(1), unless the authorization is terminated or revoked.  Performed at [facility], [place] [address], [place], [place] [address]   MRSA PCR Screening     Status: None   Collection Time: 04/02/20 11:01 PM   Specimen: Nasal Mucosa; Nasopharyngeal  Result Value Ref Range Status   MRSA by PCR NEGATIVE NEGATIVE Final    Comment:        The GeneXpert MRSA Assay (FDA approved for NASAL specimens only), is one component of a comprehensive MRSA colonization surveillance program. It is not intended to diagnose MRSA infection nor to guide or monitor treatment for MRSA infections. Performed at [facility], [place] [address], [place], [place] [address]       Studies: No results found.  Scheduled Meds: . amLODipine  5 mg Oral Daily  . cholecalciferol  2,000 Units Oral Daily  . dexamethasone  4 mg Oral Q breakfast  . fentaNYL  1 patch Transdermal Q72H  . gabapentin  300 mg Oral BID  . heparin  5,000 Units Subcutaneous Q8H  . metoprolol tartrate  50 mg Oral BID  . nicotine  21 mg Transdermal Daily  . polyethylene glycol  17 g Oral Daily  . senna-docusate  2 tablet Oral BID  . sodium chloride flush  3 mL Intravenous Q12H    Continuous Infusions:    LOS: 5 days     Kayleen Memos, MD [facility] Pager [phone]  If 7PM-7AM, please contact night-coverage [email or web address] Password [facility] 04/07/2020, 2:36 PM

## 2020-04-07 NOTE — Plan of Care (Signed)
  Problem: Clinical Measurements: Goal: Will remain free from infection Outcome: Progressing Goal: Cardiovascular complication will be avoided Outcome: Progressing   

## 2020-04-08 ENCOUNTER — Ambulatory Visit
Admit: 2020-04-08 | Discharge: 2020-04-08 | Disposition: A | Discharge status: Home / Self Care | Payer: BC Managed Care – PPO | Attending: Radiation Oncology | Admitting: Radiation Oncology

## 2020-04-08 DIAGNOSIS — Z5111 Encounter for antineoplastic chemotherapy: Secondary | ICD-10-CM | POA: Diagnosis not present

## 2020-04-08 DIAGNOSIS — C9000 Multiple myeloma not having achieved remission: Secondary | ICD-10-CM | POA: Diagnosis not present

## 2020-04-08 LAB — CBC
HCT: 29.9 % — ABNORMAL LOW (ref 36.0–46.0)
Hemoglobin: 9.5 g/dL — ABNORMAL LOW (ref 12.0–15.0)
MCH: 31.9 pg (ref 26.0–34.0)
MCHC: 31.8 g/dL (ref 30.0–36.0)
MCV: 100.3 fL — ABNORMAL HIGH (ref 80.0–100.0)
Platelets: 216 10*3/uL (ref 150–400)
RBC: 2.98 MIL/uL — ABNORMAL LOW (ref 3.87–5.11)
RDW: 15.1 % (ref 11.5–15.5)
WBC: 6.6 10*3/uL (ref 4.0–10.5)
nRBC: 0 % (ref 0.0–0.2)

## 2020-04-08 LAB — DIFFERENTIAL
Abs Immature Granulocytes: 0.04 10*3/uL (ref 0.00–0.07)
Basophils Absolute: 0 10*3/uL (ref 0.0–0.1)
Basophils Relative: 0 %
Eosinophils Absolute: 0.2 10*3/uL (ref 0.0–0.5)
Eosinophils Relative: 3 %
Immature Granulocytes: 1 %
Lymphocytes Relative: 15 %
Lymphs Abs: 1 10*3/uL (ref 0.7–4.0)
Monocytes Absolute: 0.6 10*3/uL (ref 0.1–1.0)
Monocytes Relative: 8 %
Neutro Abs: 4.9 10*3/uL (ref 1.7–7.7)
Neutrophils Relative %: 73 %

## 2020-04-08 LAB — BASIC METABOLIC PANEL
Anion gap: 8 (ref 5–15)
BUN: 41 mg/dL — ABNORMAL HIGH (ref 6–20)
CO2: 19 mmol/L — ABNORMAL LOW (ref 22–32)
Calcium: 9.3 mg/dL (ref 8.9–10.3)
Chloride: 106 mmol/L (ref 98–111)
Creatinine, Ser: 1.11 mg/dL — ABNORMAL HIGH (ref 0.44–1.00)
GFR, Estimated: >60 mL/min (ref >60)
Glucose, Bld: 93 mg/dL (ref 70–99)
Potassium: 3.6 mmol/L (ref 3.5–5.1)
Sodium: 133 mmol/L — ABNORMAL LOW (ref 135–145)

## 2020-04-08 LAB — MAGNESIUM: Magnesium: 2 mg/dL (ref 1.7–2.4)

## 2020-04-08 LAB — PHOSPHORUS: Phosphorus: 2.2 mg/dL — ABNORMAL LOW (ref 2.5–4.6)

## 2020-04-08 MED ORDER — BORTEZOMIB CHEMO SQ INJECTION 3.5 MG (2.5MG/ML)
1.3000 mg/m2 | Freq: Once | INTRAMUSCULAR | Status: AC
  Administered 2020-04-08: 2.75 mg via SUBCUTANEOUS
  Filled 2020-04-08: qty 1.1

## 2020-04-08 MED ORDER — HEPARIN SOD (PORK) LOCK FLUSH 100 UNIT/ML IV SOLN: 250.0000 [IU] | Freq: Once | INTRAVENOUS | Status: DC | PRN

## 2020-04-08 MED ORDER — SODIUM CHLORIDE 0.9 % IV SOLN
400.0000 mg/m2 | Freq: Once | INTRAVENOUS | Status: AC
  Administered 2020-04-08: 880 mg via INTRAVENOUS
  Filled 2020-04-08: qty 44

## 2020-04-08 MED ORDER — SODIUM CHLORIDE 0.9% FLUSH: 10.0000 mL | INTRAVENOUS | Status: DC | PRN

## 2020-04-08 MED ORDER — HEPARIN SOD (PORK) LOCK FLUSH 100 UNIT/ML IV SOLN: 500.0000 [IU] | Freq: Once | INTRAVENOUS | Status: DC | PRN

## 2020-04-08 MED ORDER — PALONOSETRON HCL INJECTION 0.25 MG/5ML
0.2500 mg | Freq: Once | INTRAVENOUS | Status: AC
Start: 2020-04-08 — End: 2020-04-08
  Administered 2020-04-08: 0.25 mg via INTRAVENOUS
  Filled 2020-04-08: qty 5

## 2020-04-08 MED ORDER — SODIUM CHLORIDE 0.9% FLUSH: 3.0000 mL | INTRAVENOUS | Status: DC | PRN

## 2020-04-08 MED ORDER — GABAPENTIN 300 MG PO CAPS
300.0000 mg | ORAL_CAPSULE | Freq: Three times a day (TID) | ORAL | Status: DC
  Administered 2020-04-08 – 2020-04-09 (×2): 300 mg via ORAL
  Filled 2020-04-08 (×2): qty 1

## 2020-04-08 MED ORDER — SODIUM CHLORIDE 0.9 % IV SOLN
40.0000 mg | Freq: Once | INTRAVENOUS | Status: AC
  Administered 2020-04-08: 40 mg via INTRAVENOUS
  Filled 2020-04-08: qty 4

## 2020-04-08 MED ORDER — SODIUM CHLORIDE 0.9 % IV SOLN: Freq: Once | INTRAVENOUS | Status: DC

## 2020-04-08 MED ORDER — ALTEPLASE 2 MG IJ SOLR
2.0000 mg | Freq: Once | INTRAMUSCULAR | Status: DC | PRN
  Filled 2020-04-08: qty 2

## 2020-04-08 NOTE — Progress Notes (Signed)
Dr Irene Limbo does wish to start oral antiviral at this time to allow kidneys to stabilize.  He will start in the future.  T.O. Dr Wayna Chalet, PharmD

## 2020-04-08 NOTE — Evaluation (Signed)
Physical Therapy Evaluation Patient Details Name: Stacey Montoya MRN: [id number] DOB: 1975-05-15 Today's Date: 04/08/2020   History of Present Illness  Stacey Montoya is a 45 y.o. 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  Clinical Impression  Pt admitted with above diagnosis. PTA, pt independent and ambulating 1-2 miles for exercise. Pt reports recently getting rollator walker and BSC at home due to decreased activity with increased pain over the past few weeks. Pt with newly diagnosed multiple myeloma, currently with increased pain throughout L hip down into L leg with weight-bearing limiting ambulation tolerance and distance. Pt reports good family support at home and goal is to return home with HHPT/OT to assist with strengthening and mobility training. Pt's spouse present during eval encouraging pt throughout. Pt currently with functional limitations due to the deficits listed below (see PT Problem List). Pt will benefit from skilled PT to increase their independence and safety with mobility to allow discharge to the venue listed below.       Follow Up Recommendations Home health PT;Supervision - Intermittent    Equipment Recommendations  Rolling walker with 5" wheels    Recommendations for Other Services       Precautions / Restrictions Precautions Precautions: Fall Precaution Comments: at risk for pathological fx due to multiple myeloma Restrictions Weight Bearing Restrictions: No      Mobility  Bed Mobility Overal bed mobility: Modified Independent  General bed mobility comments: increased time, light use of bedrail to come to sitting EOB    Transfers Overall transfer level: Needs  assistance Equipment used: Rolling walker (2 wheeled) Transfers: Sit to/from Stand Sit to Stand: Supervision  General transfer comment: BUE assisting to power up, good steadiness upon rising, increased LLE pain without buckling noted  Ambulation/Gait Ambulation/Gait assistance: Min guard Gait Distance (Feet): 22 Feet Assistive device: Rolling walker (2 wheeled) Gait Pattern/deviations: Step-to pattern;Decreased stride length Gait velocity: decreased   General Gait Details: short steps with minimal foot clearence from floor, heavy UE use on RW to decrease weight-bearing in LE, limited by L LE pain, no knee buckling or unsteadiness noted  Stairs            Wheelchair Mobility    Modified Rankin (Stroke Patients Only)       Balance Overall balance assessment: Needs assistance Sitting-balance support: Feet supported Sitting balance-Leahy Scale: Good Sitting balance - Comments: seated EOB   Standing balance support: During functional activity;Bilateral upper extremity supported Standing balance-Leahy Scale: Poor Standing balance comment: reliant on UE support        Pertinent Vitals/Pain Pain Assessment: 0-10 Pain Score: 5  Pain Location: L hip/thigh with ambulation Pain Descriptors / Indicators: Burning Pain Intervention(s): Limited activity within patient's tolerance;Monitored during session;Repositioned;RN gave pain meds during session    [place] expects to be discharged to:: Private residence Living Arrangements: Spouse/significant other Available Help at Discharge: Family;Available 24 hours/day Type of Home: House Home Access: Level entry     Home Layout: Two level;Bed/bath upstairs Home Equipment: Bedside commode;Shower seat - built in;Other (comment) (rollator)      Prior Function Level of Independence: Independent         Comments: Pt reports previously independent, walking 1-2 miles for exercise, driving.  Hand Dominance         Extremity/Trunk Assessment   Upper Extremity Assessment Upper Extremity Assessment: Defer to OT evaluation    Lower Extremity Assessment Lower Extremity Assessment: Overall WFL for tasks assessed (AROM WNL, functional strength 3+/5, numbness/tingling in bil feet)    Cervical / Trunk Assessment Cervical / Trunk Assessment: Normal  Communication   Communication: No difficulties  Cognition Arousal/Alertness: Awake/alert Behavior During Therapy: WFL for tasks assessed/performed Overall Cognitive Status: Within Functional Limits for tasks assessed       General Comments General comments (skin integrity, edema, etc.): Pt reports dizziness, sweating, ringing in ears once seated after ambulation, VSS and symptoms resolved with sitting rest and BLE elevated- RN notified    Exercises     Assessment/Plan    PT Assessment Patient needs continued PT services  PT Problem List Decreased activity tolerance;Decreased balance;Decreased knowledge of use of DME;Impaired sensation;Obesity;Pain       PT Treatment Interventions DME instruction;Gait training;Stair training;Functional mobility training;Therapeutic activities;Therapeutic exercise;Balance training;Neuromuscular re-education;Patient/family education    PT Goals (Current goals can be found in the Care Plan section)  Acute Rehab PT Goals Patient Stated Goal: return home with family to assist PT Goal Formulation: With patient/family Time For Goal Achievement: 04/22/20 Potential to Achieve Goals: Good    Frequency Min 3X/week   Barriers to discharge        Co-evaluation               AM-PAC PT "6 Clicks" Mobility  Outcome Measure Help needed turning from your back to your side while in a flat bed without using bedrails?: None Help needed moving from lying on your back to sitting on the side of a flat bed without using bedrails?: None Help needed moving to and from a bed to a chair (including a wheelchair)?:  None Help needed standing up from a chair using your arms (e.g., wheelchair or bedside chair)?: None Help needed to walk in hospital room?: A Little Help needed climbing 3-5 steps with a railing? : A Lot 6 Click Score: 21    End of Session Equipment Utilized During Treatment: Gait belt Activity Tolerance: Patient limited by pain Patient left: in chair;with call bell/phone within reach;with family/visitor present Nurse Communication: Mobility status;Other (comment) (dizziness, sweating, ear ringing with ambulation and VSS) PT Visit Diagnosis: Other abnormalities of gait and mobility (R26.89)    Time: 1220-1245 PT Time Calculation (min) (ACUTE ONLY): 25 min   Charges:   PT Evaluation $PT Eval Moderate Complexity: 1 Mod           Tori Broussard PT, DPT 04/08/20, 1:03 PM   

## 2020-04-08 NOTE — Progress Notes (Addendum)
[facility]   HEMATOLOGY/ONCOLOGY INPATIENT PROGRESS NOTE  Date of Service: 04/04/2019  Inpatient Attending: .Kayleen Memos, DO   SUBJECTIVE  Ms. Sturgess is in good spirits today.  Pain is overall well controlled at this time.  She has been ambulating to and from the bathroom with some increase in pain with mobility.  Fentanyl patch remains at 25 mcg/h.  Gabapentin 300 mg twice daily.  Overall usage of as needed oxycodone is decreasing.  Has received 20 mg over the past 24 hours.  Discussed the bone marrow biopsy results confirming multiple myeloma. Myeloma panel shows M spike of 4.1 g/dL.  Discussed plan to begin systemic chemotherapy with CyBorD.   OBJECTIVE:  NAD  PHYSICAL EXAMINATION: . Vitals:   04/07/20 0544 04/07/20 0624 04/07/20 1954 04/08/20 0544  BP: (!) 167/98 (!) 158/82 (!) 144/82 (!) 148/86  Pulse: (!) 102  (!) 110 93  Resp: 18  18   Temp: 98.5 F (36.9 C)  98.1 F (36.7 C) 97.9 F (36.6 C)  TempSrc: Oral  Oral Oral  SpO2: 93%  96% 97%  Weight:      Height:       Filed Weights   04/02/20 1600 04/02/20 2230 04/03/20 2326  Weight: 108.9 kg 108.9 kg 106.9 kg   .Body mass index is 40.45 kg/m.  GENERAL:alert, in no acute distress and comfortable SKIN: skin color, texture, turgor are normal, no rashes or significant lesions EYES: normal, conjunctiva are pink and non-injected, sclera clear OROPHARYNX:no exudate, no erythema and lips, buccal mucosa, and tongue normal  NECK: supple, no JVD, thyroid normal size, non-tender, without nodularity LYMPH:  no palpable lymphadenopathy in the cervical, axillary or inguinal LUNGS: clear to auscultation with normal respiratory effort HEART: regular rate & rhythm,  no murmurs and no lower extremity edema ABDOMEN: abdomen soft, non-tender, normoactive bowel sounds  Musculoskeletal: no cyanosis of digits and no clubbing  PSYCH: alert & oriented x 3 with fluent speech NEURO: no focal motor/sensory deficits  MEDICAL HISTORY:  Past  Medical History:  Diagnosis Date  . Back pain     SURGICAL HISTORY: History reviewed. No pertinent surgical history.  SOCIAL HISTORY: Social History   Socioeconomic History  . Marital status: Married    Spouse name: Not on file  . Number of children: Not on file  . Years of education: Not on file  . Highest education level: Not on file  Occupational History  . Not on file  Tobacco Use  . Smoking status: Current Every Day Smoker    Packs/day: 0.50    Years: 20.00    Pack years: 10.00    Types: Cigarettes  . Smokeless tobacco: Never Used  Vaping Use  . Vaping Use: Every day  Substance and Sexual Activity  . Alcohol use: Not Currently  . Drug use: Not Currently  . Sexual activity: Not on file  Other Topics Concern  . Not on file  Social History Narrative  . Not on file   Social Determinants of Health   Financial Resource Strain: Not on file  Food Insecurity: Not on file  Transportation Needs: Not on file  Physical Activity: Not on file  Stress: Not on file  Social Connections: Not on file  Intimate Partner Violence: Not on file    FAMILY HISTORY: Family History  Problem Relation Age of Onset  . Lung cancer Maternal Grandfather   . Pancreatic cancer Paternal Grandfather     ALLERGIES:  is allergic to azithromycin.  MEDICATIONS:  Scheduled  Meds: . amLODipine  5 mg Oral Daily  . cholecalciferol  2,000 Units Oral Daily  . dexamethasone  4 mg Oral Q breakfast  . fentaNYL  1 patch Transdermal Q72H  . gabapentin  300 mg Oral BID  . heparin  5,000 Units Subcutaneous Q8H  . metoprolol tartrate  50 mg Oral BID  . nicotine  21 mg Transdermal Daily  . polyethylene glycol  17 g Oral Daily  . senna-docusate  2 tablet Oral BID  . sodium chloride flush  3 mL Intravenous Q12H   Continuous Infusions:  PRN Meds:.albuterol, labetalol, LORazepam, ondansetron **OR** ondansetron (ZOFRAN) IV, oxyCODONE  REVIEW OF SYSTEMS:    10 Point review of Systems was done is  negative except as noted above.   LABORATORY DATA:  I have reviewed the data as listed  CBC    Component Value Date/Time   WBC 6.6 04/08/2020 0533   RBC 2.98 (L) 04/08/2020 0533   HGB 9.5 (L) 04/08/2020 0533   HCT 29.9 (L) 04/08/2020 0533   PLT 216 04/08/2020 0533   MCV 100.3 (H) 04/08/2020 0533   MCH 31.9 04/08/2020 0533   MCHC 31.8 04/08/2020 0533   RDW 15.1 04/08/2020 0533   LYMPHSABS 0.6 (L) 04/04/2020 0958   MONOABS 0.7 04/04/2020 0958   EOSABS 0.1 04/04/2020 0958   BASOSABS 0.0 04/04/2020 0958    CMP Latest Ref Rng & Units 04/08/2020 04/07/2020 04/06/2020  Glucose 70 - 99 mg/dL 93 94 89  BUN 6 - 20 mg/dL 41(H) 46(H) 51(H)  Creatinine 0.44 - 1.00 mg/dL 1.11(H) 1.22(H) 1.35(H)  Sodium 135 - 145 mmol/L 133(L) 132(L) 133(L)  Potassium 3.5 - 5.1 mmol/L 3.6 3.4(L) 3.8  Chloride 98 - 111 mmol/L 106 104 105  CO2 22 - 32 mmol/L 19(L) 22 22  Calcium 8.9 - 10.3 mg/dL 9.3 9.3 9.9  Total Protein 6.5 - 8.1 g/dL - 8.7(H) 8.8(H)  Total Bilirubin 0.3 - 1.2 mg/dL - 0.7 0.6  Alkaline Phos 38 - 126 U/L - 327(H) 266(H)  AST 15 - 41 U/L - 13(L) 11(L)  ALT 0 - 44 U/L - 31 26   Component     Latest Ref Rng & Units 04/03/2020         4:32 AM  IgG (Immunoglobin G), Serum     586 - 1,602 mg/dL 5,749 (H)  IgA     87 - 352 mg/dL 38 (L)  IgM (Immunoglobulin M), Srm     26 - 217 mg/dL 9 (L)  Total Protein ELP     6.0 - 8.5 g/dL 10.0 (H)  Albumin SerPl Elph-Mcnc     2.9 - 4.4 g/dL 3.1  Alpha 1     0.0 - 0.4 g/dL 0.5 (H)  Alpha2 Glob SerPl Elph-Mcnc     0.4 - 1.0 g/dL 1.0  B-Globulin SerPl Elph-Mcnc     0.7 - 1.3 g/dL 1.1  Gamma Glob SerPl Elph-Mcnc     0.4 - 1.8 g/dL 4.3 (H)  M Protein SerPl Elph-Mcnc     Not Observed g/dL 4.1 (H)  Globulin, Total     2.2 - 3.9 g/dL 6.9 (H)  Albumin/Glob SerPl     0.7 - 1.7 0.5 (L)  IFE 1      Comment (A)  Please Note (HCV):      Comment  Albumin ELP     2.9 - 4.4 g/dL   Alpha-1-Globulin     0.0 - 0.4 g/dL   Alpha-2-Globulin     0.4 -  1.0 g/dL   Beta Globulin     0.7 - 1.3 g/dL   Gamma Globulin     0.4 - 1.8 g/dL   M-SPIKE, %     Not Observed g/dL   SPE Interp.        Comment        A/G Ratio     0.7 - 1.7    Component     Latest Ref Rng & Units 04/02/2020          IgG (Immunoglobin G), Serum     586 - 1,602 mg/dL   IgA     87 - 352 mg/dL   IgM (Immunoglobulin M), Srm     26 - 217 mg/dL   Total Protein ELP     6.0 - 8.5 g/dL   Albumin SerPl Elph-Mcnc     2.9 - 4.4 g/dL   Alpha 1     0.0 - 0.4 g/dL   Alpha2 Glob SerPl Elph-Mcnc     0.4 - 1.0 g/dL   B-Globulin SerPl Elph-Mcnc     0.7 - 1.3 g/dL   Gamma Glob SerPl Elph-Mcnc     0.4 - 1.8 g/dL   M Protein SerPl Elph-Mcnc     Not Observed g/dL   Globulin, Total     2.2 - 3.9 g/dL   Albumin/Glob SerPl     0.7 - 1.7   IFE 1        Please Note (HCV):        Albumin ELP     2.9 - 4.4 g/dL   Alpha-1-Globulin     0.0 - 0.4 g/dL   Alpha-2-Globulin     0.4 - 1.0 g/dL   Beta Globulin     0.7 - 1.3 g/dL   Gamma Globulin     0.4 - 1.8 g/dL   M-SPIKE, %     Not Observed g/dL   SPE Interp.        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 Plasma cells show aberrant CD56 expression by  immunohistochemistry and are kappa-restricted by light chain in situ  hybridization. Plasma cells are increased on aspirate smears (12% by  manual differential count) and show atypical morphology. 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 Correlation with clinical findings, radiographic  studies, other laboratory data (eg. SPEP, immunofixation), and  cytogenetics/FISH results is recommended.   Preliminary findings were reported to K. Curcio on 04/05/20 at 1300 by S.  O'Neill.  RADIOGRAPHIC STUDIES: I have personally reviewed the radiological images as listed and agreed with the findings in the report. DG Chest 2 View  Result Date: 04/02/2020 CLINICAL DATA:  45 year old female with tachycardia and low grade fever. EXAM: CHEST - 2 VIEW COMPARISON:  03/10/2018 FINDINGS: The heart size and mediastinal contours are within normal limits. Both lungs are clear. The visualized skeletal structures are unremarkable. IMPRESSION: No acute cardiopulmonary process. Electronically Signed   By: Ruthann Cancer MD   On: 04/02/2020 09:09   CT L-SPINE NO CHARGE  Result Date: 04/02/2020 CLINICAL DATA:  Back pain.  Pathologic fracture with hypercalcemia. EXAM: CT LUMBAR SPINE WITHOUT CONTRAST TECHNIQUE: Multidetector CT imaging of the lumbar spine was performed without intravenous contrast administration. Multiplanar CT image reconstructions were also generated. COMPARISON:  Thoracolumbar spine radiographs 03/07/2020 FINDINGS: Segmentation: 5 lumbar type vertebrae. Alignment: Straightening of the normal lumbar lordosis. No significant listhesis. Vertebrae: Widespread lytic bone lesions throughout the lumbar spine as well as included lower thoracic spine and pelvis. Pathologic T12 compression fracture with 50% vertebral body height  loss, progressed from the prior radiographs. New pathologic L4 fracture involving the vertebral body and pedicles with 85% vertebral body height loss centrally and 6 mm retropulsion of the posterior vertebral cortex. Large lesion involving the left pedicle, pars, and transverse process of L1 with possible nondisplaced pathologic fracture. Paraspinal and other soft tissues: No acute paraspinal soft tissue abnormality. Intra-abdominal and pelvic contents reported separately. 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 IMPRESSION: 1. Widespread lytic bone lesions consistent with metastatic disease. 2. Pathologic T12 and L4 vertebral fractures as above. Possible nondisplaced pathologic fracture of the left L1 posterior elements. 3. 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 Electronically Signed   By: Logan Bores M.D.   On: 04/02/2020 12:52   DG CHEST PORT 1 VIEW  Result Date: 04/04/2020 CLINICAL DATA:  Multiple myeloma EXAM: PORTABLE CHEST 1 VIEW COMPARISON:  Portable exam 1812 hours compared to 04/02/2020 FINDINGS: Stable heart size and pulmonary vascularity. Prominent soft tissue at RIGHT mediastinal border just above the RIGHT hilum, increased in prominence since the previous exam, corresponding to paraspinal mass on prior CT study. Scattered interstitial infiltrates are seen in both lungs which could represent atypical infection or edema. No pleural effusion or pneumothorax. Bones demineralized. Bone destruction of the posterior RIGHT third rib identified. IMPRESSION: Scattered interstitial infiltrates, question edema versus atypical infection. Electronically Signed   By: Lavonia Dana M.D.   On: 04/04/2020 18:35   DG Bone Survey Met  Result Date: 04/02/2020 CLINICAL DATA:  Lytic lesion on x-ray.  Possible multiple myeloma. EXAM: METASTATIC BONE SURVEY COMPARISON:  CT of same day. FINDINGS: Ill-defined lucencies are noted throughout the skull  which may represent lytic lesions. Lucencies are noted in the bilateral humeri. Lytic lesions are seen involving the right third and fourth ribs. Probable lytic lesion is seen involving the left fourth rib. Lytic lesions are noted in the left scapula. Fractures are seen involving the T12 and L4 vertebral bodies consistent with pathologic fractures. Lucencies are seen throughout the pelvis and visualized proximal femurs consistent with lytic lesions. IMPRESSION: Multiple lytic lesions are noted consistent with multiple myeloma or metastatic disease. Pathologic fractures are seen involving the T12 and L4 vertebral bodies. Electronically  Signed   By: Marijo Conception M.D.   On: 04/02/2020 15:29   CT BONE MARROW BIOPSY & ASPIRATION  Result Date: 04/03/2020 INDICATION: 45 year old female with newly diagnosed multiple myeloma. She presents for CT-guided bone marrow biopsy to assess for marrow involvement. EXAM: CT GUIDED BONE MARROW ASPIRATION AND CORE BIOPSY Interventional Radiologist:  Criselda Peaches, MD MEDICATIONS: None. ANESTHESIA/SEDATION: Moderate (conscious) sedation was employed during this procedure. A total of 4 milligrams versed and 150 micrograms fentanyl were administered intravenously. The patient's level of consciousness and vital signs were monitored continuously by radiology nursing throughout the procedure under my direct supervision. Total monitored sedation time: 18 minutes FLUOROSCOPY TIME:  None COMPLICATIONS: None immediate. Estimated blood loss: <25 mL PROCEDURE: Informed written consent was obtained from the patient after a thorough discussion of the procedural risks, benefits and alternatives. All questions were addressed. Maximal Sterile Barrier Technique was utilized including caps, mask, sterile gowns, sterile gloves, sterile drape, hand hygiene and skin antiseptic. A timeout was performed prior to the initiation of the procedure. The patient was positioned prone and non-contrast  localization CT was performed of the pelvis to demonstrate the iliac marrow spaces. Maximal barrier sterile technique utilized including caps, mask, sterile gowns, sterile gloves, large sterile drape, hand hygiene, and betadine prep. Under sterile conditions and local anesthesia, an 11 gauge coaxial bone biopsy needle was advanced into the right iliac marrow space. Needle position was confirmed with CT imaging. Initially, bone marrow aspiration was performed. Next, the 11 gauge outer cannula was utilized to obtain a right iliac bone marrow core biopsy. Needle was removed. Hemostasis was obtained with compression. The patient tolerated the procedure well. Samples were prepared with the cytotechnologist. IMPRESSION: Technically successful CT-guided bone marrow aspiration and core biopsy of the right iliac bone. Electronically Signed   By: Jacqulynn Cadet M.D.   On: 04/03/2020 19:33   ECHOCARDIOGRAM COMPLETE  Result Date: 04/06/2020    ECHOCARDIOGRAM REPORT   Patient Name:   SHARIA AVERITT Renaud Date of Exam: 04/06/2020 Medical Rec #:  [id number]         Height:       64.0 in Accession #:    [id number]        Weight:       235.7 lb Date of Birth:  07/12/1975         BSA:          2.098 m Patient Age:    45 years          BP:           188/99 mmHg Patient Gender: F                 HR:           99 bpm. Exam Location:  Inpatient Procedure: 2D Echo, Cardiac Doppler and Color Doppler Indications:    Elevated Troponin  History:        Patient has no prior history of Echocardiogram examinations.  Sonographer:    Bernadene Person RDCS Referring Phys: [id number] [place]  1. Left ventricular ejection fraction, by estimation, is 60 to 65%. The left ventricle has normal function. The left ventricle has no regional wall motion abnormalities. Left ventricular diastolic parameters are consistent with Grade I diastolic dysfunction (impaired relaxation). Elevated left ventricular end-diastolic pressure.  2. Right  ventricular systolic function is normal. The right ventricular size is normal. There is mildly elevated pulmonary artery systolic pressure. The estimated right ventricular systolic  pressure is 36.2 mmHg.  3. The mitral valve is normal in structure. Mild mitral valve regurgitation. No evidence of mitral stenosis.  4. The aortic valve is normal in structure. Aortic valve regurgitation is not visualized. No aortic stenosis is present.  5. The inferior vena cava is normal in size with greater than 50% respiratory variability, suggesting right atrial pressure of 3 mmHg. FINDINGS  Left Ventricle: Left ventricular ejection fraction, by estimation, is 60 to 65%. The left ventricle has normal function. The left ventricle has no regional wall motion abnormalities. The left ventricular internal cavity size was normal in size. There is  no left ventricular hypertrophy. Left ventricular diastolic parameters are consistent with Grade I diastolic dysfunction (impaired relaxation). Elevated left ventricular end-diastolic pressure. Right Ventricle: The right ventricular size is normal. No increase in right ventricular wall thickness. Right ventricular systolic function is normal. There is mildly elevated pulmonary artery systolic pressure. The tricuspid regurgitant velocity is 2.88  m/s, and with an assumed right atrial pressure of 3 mmHg, the estimated right ventricular systolic pressure is 53.2 mmHg. Left Atrium: Left atrial size was normal in size. Right Atrium: Right atrial size was normal in size. Pericardium: There is no evidence of pericardial effusion. Mitral Valve: The mitral valve is normal in structure. There is mild thickening of the mitral valve leaflet(s). Mild mitral valve regurgitation. No evidence of mitral valve stenosis. Tricuspid Valve: The tricuspid valve is normal in structure. Tricuspid valve regurgitation is mild . No evidence of tricuspid stenosis. Aortic Valve: The aortic valve is normal in structure.  Aortic valve regurgitation is not visualized. No aortic stenosis is present. Pulmonic Valve: The pulmonic valve was normal in structure. Pulmonic valve regurgitation is not visualized. No evidence of pulmonic stenosis. Aorta: The aortic root is normal in size and structure. Venous: The inferior vena cava is normal in size with greater than 50% respiratory variability, suggesting right atrial pressure of 3 mmHg. IAS/Shunts: No atrial level shunt detected by color flow Doppler.  LEFT VENTRICLE PLAX 2D LVIDd:         5.08 cm  Diastology LVIDs:         3.00 cm  LV e' medial:    0.06 cm/s LV PW:         0.96 cm  LV E/e' medial:  17.5 LV IVS:        0.87 cm  LV e' lateral:   0.08 cm/s LVOT diam:     2.20 cm  LV E/e' lateral: 13.0 LV SV:         81 LV SV Index:   38 LVOT Area:     3.80 cm  RIGHT VENTRICLE RV S prime:     9.11 cm/s TAPSE (M-mode): 2.0 cm LEFT ATRIUM         Index LA diam:    3.30 cm 1.57 cm/m  AORTIC VALVE LVOT Vmax:   124.00 cm/s LVOT Vmean:  85.100 cm/s LVOT VTI:    0.212 m  AORTA Ao Root diam: 3.30 cm Ao Asc diam:  3.20 cm MITRAL VALVE                TRICUSPID VALVE MV Area (PHT): 2.60 cm     TR Peak grad:   33.2 mmHg MV Decel Time: 292 msec     TR Vmax:        288.00 cm/s MV E velocity: 0.98 cm/s MV A velocity: 102.00 cm/s  SHUNTS MV E/A ratio:  0.01  Systemic VTI:  0.21 m                             Systemic Diam: 2.20 cm Ena Dawley MD Electronically signed by Ena Dawley MD Signature Date/Time: 04/06/2020/1:18:07 PM    Final    DG FEMUR PORT MIN 2 VIEWS LEFT  Result Date: 04/02/2020 CLINICAL DATA:  Bilateral thigh pain.  History of myeloma. EXAM: LEFT FEMUR PORTABLE 2 VIEWS COMPARISON:  Femur exam on bone survey earlier today. Included portion from CT earlier today. FINDINGS: Lucent lesion in the lesser trochanter was better appreciated on CT earlier today. There are multiple small lucencies involving the femoral head and intertrochanteric femur that are not well seen by  radiograph. No obvious destructive lesion in the femoral shaft. No evidence of pathologic fracture. IMPRESSION: Proximal femoral lesions including a lesion of the lesser trochanter, majority of these are better seen on CT earlier today. There is no evidence of acute femur fracture or large destructive lesion. Consider MRI for more detailed assessment. Electronically Signed   By: Keith Rake M.D.   On: 04/02/2020 21:21   DG FEMUR PORT, MIN 2 VIEWS RIGHT  Result Date: 04/02/2020 CLINICAL DATA:  Right thigh pain.  Myeloma. EXAM: RIGHT FEMUR PORTABLE 2 VIEW COMPARISON:  Included portion from bone survey earlier today. Included portion from pelvis CT earlier today. FINDINGS: Lucent lesion in the intertrochanteric femur on CT earlier today is only faintly visualized, this is at risk for pathologic fracture. There is some thinning of the lateral cortex of the greater trochanter. There are multiple additional small lucencies in the femoral head on CT that are not well seen by radiograph. Small lesion noted in the distal lateral aspect of the femur in the region of the physeal scar. No evidence of acute fracture. IMPRESSION: Relatively large lucent lesion in the intertrochanteric femur on CT earlier today is only faintly visualized, this is at risk for pathologic fracture. There are multiple additional lucencies in the femoral head on CT are not well seen by radiograph. There is a small lesion in the distal lateral femoral metaphysis. MRI may be of value for more detailed assessment. Electronically Signed   By: Keith Rake M.D.   On: 04/02/2020 21:23   CT CHEST ABDOMEN PELVIS WO CONTRAST  Result Date: 04/02/2020 CLINICAL DATA:  Thoracic and lumbar spine pain. Pathologic fracture with hypercalcemia. EXAM: CT CHEST, ABDOMEN AND PELVIS WITHOUT CONTRAST TECHNIQUE: Multidetector CT imaging of the chest, abdomen and pelvis was performed following the standard protocol without IV contrast. COMPARISON:  Chest  radiographs 04/02/2020. Thoracolumbar spine radiographs 03/07/2020. FINDINGS: CT CHEST FINDINGS Cardiovascular: Normal caliber of the thoracic aorta. Normal heart size. No pericardial effusion. Mediastinum/Nodes: No enlarged axillary, mediastinal, or hilar lymph nodes are identified with hilar assessment limited in the absence of IV contrast. Unremarkable visualized thyroid and esophagus. Lungs/Pleura: No pleural effusion or pneumothorax. Patchy and hazy ground-glass opacities in the left greater than right upper lobes. No parenchymal lung mass. Musculoskeletal: Widespread lytic bone lesions throughout the visualized skeleton including a destructive 5 cm lesion involving the posterior right third rib and a 3 cm destructive lesion involving the posteromedial right sixth rib. Smaller destructive lesions with nondisplaced pathologic fractures of the posterolateral right fourth and posterior left fourth ribs. Large lesion involving much of the T1 vertebral body with disruption of the posterior vertebral body cortex but no gross epidural tumor. Pathologic vertebral body fractures with mild vertebral body  height loss at T7 and T8 and moderate height loss at T12. CT ABDOMEN PELVIS FINDINGS Hepatobiliary: No focal liver abnormality is seen. No gallstones, gallbladder wall thickening, or biliary dilatation. Pancreas: Unremarkable. Spleen: Unremarkable. Adrenals/Urinary Tract: Unremarkable adrenal glands. No evidence of renal mass, calculi, or hydronephrosis. Unremarkable bladder. Stomach/Bowel: The stomach is moderately distended by fluid and is otherwise unremarkable. There is no evidence of bowel obstruction or inflammation. The appendix is unremarkable. Vascular/Lymphatic: Normal caliber of the abdominal aorta. No enlarged lymph nodes. Reproductive: Grossly unremarkable uterus and ovaries. Other: No ascites. Musculoskeletal: Widespread lytic bone lesions throughout the spine and pelvis with the lumbar spine evaluated in  detail on the separate dedicated spine CT. IMPRESSION: 1. Widespread lytic lesions throughout the visualized skeleton consistent with metastatic disease or multiple myeloma. No definite primary malignancy identified in the chest, abdomen, or pelvis. 2. Nonspecific pulmonary ground-glass opacities in the left greater than right upper lobes, likely infectious or inflammatory. Electronically Signed   By: Logan Bores M.D.   On: 04/02/2020 13:12    ASSESSMENT & PLAN:   45 year old very pleasant lady with  1) Newly diagnosed multiple myeloma with extensive bone lesions 2) hypercalcemia due to multiple myeloma-now resolved with IV fluids, calcitonin, pamidronate. 3) anemia due to multiple myeloma becoming more apparent as the patient's hemoconcentration due to dehydration has been corrected.   4) acute renal failure related to dehydration hypercalcemia and multiple myeloma.  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 -Continue gabapentin 300 mg twice a day for radicular pain. -Continue current bowel regimen. -Started on vitamin D 2000 units daily for vitamin D deficiency -Hemoglobin stable transfuse as needed for hemoglobin less than 7.5. -Continue radiation under the care of Dr. Lisbeth Renshaw. -We will plan to begin systemic chemotherapy with day 1 cycle 1 of CyBorD.  Discussed the treatment regimen with the patient and provided handouts to her about the medications that will be given.  Discussed adverse effects of this treatment including but not limited to myelosuppression, nausea and vomiting, constipation or diarrhea  peripheral neuropathy, rash.  The patient agrees to proceed. -We will consider adding antiviral once renal function continues to improve. -We will likely need outpatient PET CT scan -Oncology will continue to follow closely  Mikey Bussing, DNP, AGPCNP-BC, AOCNP  ADDENDUM  .Patient was Personally and independently interviewed, examined and relevant elements of the history of present illness were reviewed in details and an assessment and plan was created. All elements of the patient's history of present illness , assessment and plan were discussed in details with Mikey Bussing, DNP, AGPCNP-BC, AOCNP. The above documentation reflects our combined findings assessment and plan.  -will need outpatient PET/CT, referral to IR for consideration of osteocool ablation and vertebroplasty for vertebral pathologic fractures and referral to [place] oncology to evaluate risk of femoral fractures. -mobilize with PT -will likely need HHS and home PT/OT -counseled on smoking cessation and importance of nicotine avoidance for bone healing.  Sullivan Lone MD MS

## 2020-04-08 NOTE — Progress Notes (Signed)
PROGRESS NOTE  Stacey Montoya WER:[id number] DOB: [birth year] DOA: 04/02/2020 PCP: Celene Squibb, MD  HPI/Recap of past 24 hours: This is a 45 year old female with history of smoking but no other medical problems, comes to the hospital with severe back pain.  This has been going on and progressively getting worse over the last couple of months.  She is seeing a chiropractor as an outpatient, also saw Dr Kathyrn Sheriff with neurosurgery due to a T12 compression fracture, treated conservatively.  She has been having worsening low back pain as well as left hip pain and eventually could not take it anymore and came to the hospital.  She was found to be hypercalcemic with a calcium of 14.5, and a CT scan showed widespread lytic lesions consistent with metastatic disease or multiple myeloma.  Oncology consulted.  She received pamidronate on 04/03/2020 for hypercalcemia of malignancy and aggressive IV fluid with improvement.  She underwent a bone marrow biopsy on 04/03/2020 by IR, Dr. Chevis Pretty.  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  Transferred to [facility] hospital to start urgent radiation therapy.  First radiation therapy planned for 04/04/2020, 4PM.  Bone marrow biopsy confirmed multiple myeloma.    04/08/20: Seen and examined at bedside.  Pain in left hip with ambulation.     Assessment/Plan: Principal Problem:   Hypercalcemia of malignancy Active Problems:   Lytic bone lesions on xray   Hypertensive urgency   Normocytic anemia   Acute renal failure (ARF) (HCC)   Hyponatremia   Multiple myeloma (HCC)   Drug-induced constipation   Neoplasm related pain   Encounter for antineoplastic chemotherapy  Resolved severe hypercalcemia of malignancy, improving. Presented with calcium 14.5 corrected for albumin 15.9.  And widespread lytic lesions consistent with metastatic disease  or multiple myeloma. She received pamidronate on 04/03/2020 as well as aggressive IV fluid hydration. Hypercalcemia resolved, 9.3. IV fluid was stopped on 04/04/2020 due to dyspnea, acute hypoxemia, and pulmonary edema seen on chest x-ray. Continue to monitor serum calcium with daily BMPs  Newly diagnosed multiple myeloma Post bone marrow biopsy on 04/03/2020, which confirmed multiple myeloma. Myeloma panel shows M spike of 4.1. 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  Started first round of radiation on 04/04/2020.  -Imaging of the hip on the right side showed large lucent lesion in the intertrochanteric femur, increased risk for pathological fracture.   Started on p.o. Decadron 4 mg daily while on radiation by oncology.  Left hip pain likely secondary to multiple myeloma lytic lesions Pain control in place Appreciate oncology's assistance.  Resolved acute hypoxic respiratory failure secondary to acute pulmonary edema Likely from IV fluid Personally reviewed chest x-ray done on 04/04/2020 which shows increasing pulmonary vascularity suggestive of pulmonary edema. Held off IV fluid on 02/01/2021 evening Received 2 doses of IV Lasix on 04/04/2020 and 04/05/2020 Maintain O2 saturation greater than 92%. Continue to monitor.  Elevated troponin suspect demand ischemia in the setting of sinus tachycardia No evidence of acute ischemia on EKG. Troponin downtrended, peaked at 53. 2D echo done on 2020-04-06 showed normal LVEF 60 to 65% with grade 1 diastolic dysfunction.  Elevated D-dimer in the setting of pleuritic pain Acute hypoxia is resolved. D-dimer is downtrending.  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 Creatinine downtrending, 1.35 from 1.70 Continue to avoid nephrotoxic agents Continue to monitor urine output Net I&O -16.4 L  Essential hypertension BP is not at goal. Started Norvasc 5 mg daily Continue metoprolol Tartrate 50 mg twice daily. Continue to closely monitor vital signs.  Hypovolemic hyponatremia, likely secondary to dehydration from hypercalcemia Serum sodium 137> 133> 132.  Hyponatremia Repleted Recheck in the morning.  Resolved normal anion gap metabolic acidosis likely secondary to renal insufficiency. Received IV fluid hydration.  Worsening isolated elevated alkaline phosphatase, suspect related to lytic lesions from multiple myeloma. AST ALT, T bilirubin normal Continue to monitor  Resolved leukocytosis, suspect reactive in the setting of steroid use WBC 6.3 from 9.7 from 15.9 Nonseptic appearing UA 04/02/2020 negative for pyuria No acute cardiopulmonary disease on 04/02/2020.  Improving anemia of chronic disease with presumed malignancy Hemoglobin is uptrending 9.5 from 9.0 from 7.4. No overt bleeding. Continue to monitor  Ambulatory dysfunction in the setting of multiple myeloma PT OT to assess Fall precautions due to multiple lytic lesions increasing risks of pathological fractures.    Code Status: Full code.  Family Communication: Updated husband at bedside on 04/04/2020.  Disposition Plan: Likely will discharge to home once medical oncology, radiation oncology and neurosurgery sign off.   Consultants:  Medical oncology  Radiation oncology  Neurosurgery.  Procedures:  Radiation planned on 04/04/2020.  Antimicrobials:  None.  DVT prophylaxis: Subcu heparin 3 times daily.  Status is: Inpatient   Dispo:  Patient From: Home  Planned Disposition: Home  Expected discharge date: 04/10/2020  Medically stable for discharge: No, ongoing management of newly diagnosed multiple myeloma.          Objective: Vitals:    04/07/20 0544 04/07/20 0624 04/07/20 1954 04/08/20 0544  BP: (!) 167/98 (!) 158/82 (!) 144/82 (!) 148/86  Pulse: (!) 102  (!) 110 93  Resp: 18  18   Temp: 98.5 F (36.9 C)  98.1 F (36.7 C) 97.9 F (36.6 C)  TempSrc: Oral  Oral Oral  SpO2: 93%  96% 97%  Weight:      Height:        Intake/Output Summary (Last 24 hours) at 04/08/2020 1821 Last data filed at 04/08/2020 1636 Gross per 24 hour  Intake 1010 ml  Output 5000 ml  Net -3990 ml   Filed Weights   04/02/20 1600 04/02/20 2230 04/03/20 2326  Weight: 108.9 kg 108.9 kg 106.9 kg    Exam:  . General: 45 y.o. year-old female pleasant well-developed well-nourished in no acute distress.  Alert and oriented x3.   . Cardiovascular: Regular rate and rhythm no rubs or gallops. Marland Kitchen Respiratory: Clear to auscultation no wheezes or rales. . Abdomen: Soft nontender normal bowel sounds present. . Musculoskeletal: Trace lower extremity edema bilaterally. . Skin: No ulcerative lesions noted. Marland Kitchen Psychiatry: Mood is appropriate for condition and setting   Data Reviewed: CBC: Recent Labs  Lab 04/04/20 0958 04/05/20 0503 04/06/20 0607 04/07/20 0606 04/08/20 0533  WBC 15.9* 14.8* 9.7 6.3 6.6  NEUTROABS 14.5*  --   --   --  4.9  HGB 7.4* 8.9* 9.0* 9.5* 9.5*  HCT 23.7* 27.4* 28.1* 29.1* 29.9*  MCV 101.3* 97.2 97.6 96.7 100.3*  PLT 290 344 253 254 099   Basic Metabolic Panel: Recent Labs  Lab 04/04/20 0958 04/05/20 0503 04/06/20 0607 04/07/20 0606 04/08/20 0531  NA  132* 137 133* 132* 133*  K 3.1* 3.5 3.8 3.4* 3.6  CL 109 109 105 104 106  CO2 19* 22 22 22  19*  GLUCOSE 109* 99 89 94 93  BUN 45* 50* 51* 46* 41*  CREATININE 1.70* 1.58* 1.35* 1.22* 1.11*  CALCIUM 9.9 10.1 9.9 9.3 9.3  MG  --  1.7  --   --  2.0  PHOS  --  2.6  --   --  2.2*   GFR: Estimated Creatinine Clearance: 78.8 mL/min (A) (by C-G formula based on SCr of 1.11 mg/dL (H)). Liver Function Tests: Recent Labs  Lab 04/03/20 0432 04/04/20 0958 04/05/20 0503  04/06/20 0607 04/07/20 0606  AST 16 21 17  11* 13*  ALT 26 28 26 26 31   ALKPHOS 219* 222* 240* 266* 327*  BILITOT 0.4 0.6 0.5 0.6 0.7  PROT 9.9* 9.2* 9.1* 8.8* 8.7*  ALBUMIN 2.2* 2.3* 2.6* 2.5* 2.6*   No results for input(s): LIPASE, AMYLASE in the last 168 hours. No results for input(s): AMMONIA in the last 168 hours. Coagulation Profile: No results for input(s): INR, PROTIME in the last 168 hours. Cardiac Enzymes: No results for input(s): CKTOTAL, CKMB, CKMBINDEX, TROPONINI in the last 168 hours. BNP (last 3 results) No results for input(s): PROBNP in the last 8760 hours. HbA1C: No results for input(s): HGBA1C in the last 72 hours. CBG: No results for input(s): GLUCAP in the last 168 hours. Lipid Profile: No results for input(s): CHOL, HDL, LDLCALC, TRIG, CHOLHDL, LDLDIRECT in the last 72 hours. Thyroid Function Tests: No results for input(s): TSH, T4TOTAL, FREET4, T3FREE, THYROIDAB in the last 72 hours. Anemia Panel: No results for input(s): VITAMINB12, FOLATE, FERRITIN, TIBC, IRON, RETICCTPCT in the last 72 hours. Urine analysis:    Component Value Date/Time   COLORURINE YELLOW 04/02/2020 [place] 04/02/2020 1227   LABSPEC 1.011 04/02/2020 1227   PHURINE 5.0 04/02/2020 1227   GLUCOSEU NEGATIVE 04/02/2020 1227   HGBUR LARGE (A) 04/02/2020 1227   BILIRUBINUR NEGATIVE 04/02/2020 [place] 04/02/2020 1227   PROTEINUR 30 (A) 04/02/2020 1227   NITRITE NEGATIVE 04/02/2020 1227   LEUKOCYTESUR NEGATIVE 04/02/2020 1227   Sepsis Labs: @LABRCNTIP (procalcitonin:4,lacticidven:4)  ) Recent Results (from the past 240 hour(s))  Resp Panel by RT-PCR (Flu A&B, Covid) Nasopharyngeal Swab     Status: None   Collection Time: 04/02/20 11:12 AM   Specimen: Nasopharyngeal Swab; Nasopharyngeal(NP) swabs in vial transport medium  Result Value Ref Range Status   SARS Coronavirus 2 by RT PCR NEGATIVE NEGATIVE Final    Comment: (NOTE) SARS-CoV-2 target  nucleic acids are NOT DETECTED.  The SARS-CoV-2 RNA is generally detectable in upper respiratory specimens during the acute phase of infection. The lowest concentration of SARS-CoV-2 viral copies this assay can detect is 138 copies/mL. A negative result does not preclude SARS-Cov-2 infection and should not be used as the sole basis for treatment or other patient management decisions. A negative result may occur with  improper specimen collection/handling, submission of specimen other than nasopharyngeal swab, presence of viral mutation(s) within the areas targeted by this assay, and inadequate number of viral copies(<138 copies/mL). A negative result must be combined with clinical observations, patient history, and epidemiological information. The expected result is Negative.  Fact Sheet for Patients:  EntrepreneurPulse.com.au  Fact Sheet for Healthcare Providers:  IncredibleEmployment.be  This test is no t yet approved or cleared by the [place] FDA and  has been authorized for detection and/or diagnosis of  SARS-CoV-2 by FDA under an Emergency Use Authorization (EUA). This EUA will remain  in effect (meaning this test can be used) for the duration of the COVID-19 declaration under Section 564(b)(1) of the Act, 21 U.S.C.section 360bbb-3(b)(1), unless the authorization is terminated  or revoked sooner.       Influenza A by PCR NEGATIVE NEGATIVE Final   Influenza B by PCR NEGATIVE NEGATIVE Final    Comment: (NOTE) The Xpert Xpress SARS-CoV-2/FLU/RSV plus assay is intended as an aid in the diagnosis of influenza from Nasopharyngeal swab specimens and should not be used as a sole basis for treatment. Nasal washings and aspirates are unacceptable for Xpert Xpress SARS-CoV-2/FLU/RSV testing.  Fact Sheet for Patients: EntrepreneurPulse.com.au  Fact Sheet for Healthcare  Providers: IncredibleEmployment.be  This test is not yet approved or cleared by the [place] FDA and has been authorized for detection and/or diagnosis of SARS-CoV-2 by FDA under an Emergency Use Authorization (EUA). This EUA will remain in effect (meaning this test can be used) for the duration of the COVID-19 declaration under Section 564(b)(1) of the Act, 21 U.S.C. section 360bbb-3(b)(1), unless the authorization is terminated or revoked.  Performed at [facility], [place] [address] [place], [place] [address]   MRSA PCR Screening     Status: None   Collection Time: 04/02/20 11:01 PM   Specimen: Nasal Mucosa; Nasopharyngeal  Result Value Ref Range Status   MRSA by PCR NEGATIVE NEGATIVE Final    Comment:        The GeneXpert MRSA Assay (FDA approved for NASAL specimens only), is one component of a comprehensive MRSA colonization surveillance program. It is not intended to diagnose MRSA infection nor to guide or monitor treatment for MRSA infections. Performed at [facility], [place] [address], [place], [place] [address]       Studies: No results found.  Scheduled Meds: . amLODipine  5 mg Oral Daily  . cholecalciferol  2,000 Units Oral Daily  . dexamethasone  4 mg Oral Q breakfast  . fentaNYL  1 patch Transdermal Q72H  . gabapentin  300 mg Oral TID  . heparin  5,000 Units Subcutaneous Q8H  . metoprolol tartrate  50 mg Oral BID  . nicotine  21 mg Transdermal Daily  . polyethylene glycol  17 g Oral Daily  . senna-docusate  2 tablet Oral BID  . sodium chloride flush  3 mL Intravenous Q12H    Continuous Infusions: . sodium chloride       LOS: 6 days     Kayleen Memos, MD [facility] Pager [phone]  If 7PM-7AM, please contact night-coverage [email or web address] Password [facility] 04/08/2020, 6:21 PM

## 2020-04-09 ENCOUNTER — Other Ambulatory Visit: Payer: Self-pay | Admitting: Internal Medicine

## 2020-04-09 ENCOUNTER — Ambulatory Visit
Admit: 2020-04-09 | Discharge: 2020-04-09 | Disposition: A | Discharge status: Home / Self Care | Payer: BC Managed Care – PPO | Attending: Radiation Oncology | Admitting: Radiation Oncology

## 2020-04-09 DIAGNOSIS — Z5111 Encounter for antineoplastic chemotherapy: Secondary | ICD-10-CM | POA: Diagnosis not present

## 2020-04-09 DIAGNOSIS — S22000A Wedge compression fracture of unspecified thoracic vertebra, initial encounter for closed fracture: Secondary | ICD-10-CM

## 2020-04-09 DIAGNOSIS — N179 Acute kidney failure, unspecified: Secondary | ICD-10-CM

## 2020-04-09 DIAGNOSIS — C9000 Multiple myeloma not having achieved remission: Secondary | ICD-10-CM | POA: Diagnosis not present

## 2020-04-09 LAB — BASIC METABOLIC PANEL
Anion gap: 8 (ref 5–15)
BUN: 37 mg/dL — ABNORMAL HIGH (ref 6–20)
CO2: 19 mmol/L — ABNORMAL LOW (ref 22–32)
Calcium: 8.5 mg/dL — ABNORMAL LOW (ref 8.9–10.3)
Chloride: 104 mmol/L (ref 98–111)
Creatinine, Ser: 0.98 mg/dL (ref 0.44–1.00)
GFR, Estimated: >60 mL/min (ref >60)
Glucose, Bld: 145 mg/dL — ABNORMAL HIGH (ref 70–99)
Potassium: 3.9 mmol/L (ref 3.5–5.1)
Sodium: 131 mmol/L — ABNORMAL LOW (ref 135–145)

## 2020-04-09 LAB — UPEP/UIFE/LIGHT CHAINS/TP, 24-HR UR
% BETA, Urine: 24.9 %
ALPHA 1 URINE: 3 %
Albumin, U: 27.8 %
Alpha 2, Urine: 21.8 %
Free Kappa Lt Chains,Ur: 36.15 mg/L (ref 0.63–113.79)
Free Kappa/Lambda Ratio: 4.28 (ref 1.03–31.76)
Free Lambda Lt Chains,Ur: 8.44 mg/L (ref 0.47–11.77)
GAMMA GLOBULIN URINE: 22.4 %
M-SPIKE %, Urine: 11.3 % — ABNORMAL HIGH
M-Spike, Mg/24 Hr: 121 mg/24 hr — ABNORMAL HIGH
Total Protein, Urine-Ur/day: 1068 mg/24 hr — ABNORMAL HIGH (ref 30–150)
Total Protein, Urine: 26.7 mg/dL
Total Volume: 4000

## 2020-04-09 LAB — CBC
HCT: 27.3 % — ABNORMAL LOW (ref 36.0–46.0)
Hemoglobin: 9 g/dL — ABNORMAL LOW (ref 12.0–15.0)
MCH: 32 pg (ref 26.0–34.0)
MCHC: 33 g/dL (ref 30.0–36.0)
MCV: 97.2 fL (ref 80.0–100.0)
Platelets: 255 10*3/uL (ref 150–400)
RBC: 2.81 MIL/uL — ABNORMAL LOW (ref 3.87–5.11)
RDW: 14.6 % (ref 11.5–15.5)
WBC: 6.4 10*3/uL (ref 4.0–10.5)
nRBC: 0 % (ref 0.0–0.2)

## 2020-04-09 LAB — HCG, QUANTITATIVE, PREGNANCY: hCG, Beta Chain, Quant, S: <1 m[IU]/mL (ref <5)

## 2020-04-09 MED ORDER — POLYETHYLENE GLYCOL 3350 17 G PO PACK: 17.0000 g | PACK | Freq: Every day | ORAL | 0 refills | Status: DC

## 2020-04-09 MED ORDER — GABAPENTIN 300 MG PO CAPS: 300.0000 mg | ORAL_CAPSULE | Freq: Two times a day (BID) | ORAL | Status: DC

## 2020-04-09 MED ORDER — GABAPENTIN 300 MG PO CAPS: 300.0000 mg | ORAL_CAPSULE | Freq: Three times a day (TID) | ORAL | 0 refills | Status: DC

## 2020-04-09 MED ORDER — FENTANYL 25 MCG/HR TD PT72: 1.0000 | MEDICATED_PATCH | TRANSDERMAL | 0 refills | Status: DC

## 2020-04-09 MED ORDER — MAGNESIUM OXIDE 400 (241.3 MG) MG PO TABS
200.0000 mg | ORAL_TABLET | Freq: Every day | ORAL | Status: DC
  Administered 2020-04-09: 200 mg via ORAL
  Filled 2020-04-09: qty 1

## 2020-04-09 MED ORDER — FOSFOMYCIN TROMETHAMINE 3 G PO PACK
3.0000 g | PACK | Freq: Once | ORAL | Status: AC
  Administered 2020-04-09: 3 g via ORAL
  Filled 2020-04-09: qty 3

## 2020-04-09 MED ORDER — AMLODIPINE BESYLATE 5 MG PO TABS
5.0000 mg | ORAL_TABLET | Freq: Every day | ORAL | 0 refills | Status: DC
Start: 2020-04-10 — End: 2020-07-10

## 2020-04-09 MED ORDER — METOPROLOL TARTRATE 50 MG PO TABS: 50.0000 mg | ORAL_TABLET | Freq: Two times a day (BID) | ORAL | 0 refills | Status: DC

## 2020-04-09 MED ORDER — DEXAMETHASONE 4 MG PO TABS: 4.0000 mg | ORAL_TABLET | Freq: Every day | ORAL | 0 refills | Status: AC

## 2020-04-09 MED ORDER — NICOTINE 21 MG/24HR TD PT24: 21.0000 mg | MEDICATED_PATCH | Freq: Every day | TRANSDERMAL | 0 refills | Status: DC

## 2020-04-09 MED ORDER — VITAMIN D3 25 MCG PO TABS: 2000.0000 [IU] | ORAL_TABLET | Freq: Every day | ORAL | 0 refills | Status: AC

## 2020-04-09 MED ORDER — GABAPENTIN 300 MG PO CAPS: 300.0000 mg | ORAL_CAPSULE | Freq: Two times a day (BID) | ORAL | 0 refills | Status: DC

## 2020-04-09 MED ORDER — POTASSIUM & SODIUM PHOSPHATES 280-160-250 MG PO PACK
1.0000 | PACK | Freq: Three times a day (TID) | ORAL | Status: DC
  Administered 2020-04-09 (×2): 1 via ORAL
  Filled 2020-04-09 (×3): qty 1

## 2020-04-09 MED ORDER — POTASSIUM CHLORIDE CRYS ER 10 MEQ PO TBCR
10.0000 meq | EXTENDED_RELEASE_TABLET | Freq: Every day | ORAL | Status: DC
  Administered 2020-04-09: 10 meq via ORAL
  Filled 2020-04-09: qty 1

## 2020-04-09 MED ORDER — OXYCODONE HCL 10 MG PO TABS: 10.0000 mg | ORAL_TABLET | Freq: Four times a day (QID) | ORAL | 0 refills | Status: AC | PRN

## 2020-04-09 NOTE — Progress Notes (Addendum)
[facility]   HEMATOLOGY/ONCOLOGY INPATIENT PROGRESS NOTE  Date of Service: 04/04/2019  Inpatient Attending: .Kayleen Memos, DO   SUBJECTIVE  Ms. Stacey Montoya continues to do well.  She received day 1 of cycle 1 of CyBorD yesterday and tolerated it well overall.  Pain is overall well controlled at this time.  Pain increases with ambulation. Fentanyl patch remains at 25 mcg/h.  Gabapentin 300 mg twice daily.  Overall usage of as needed oxycodone is decreasing.  Has received 20 mg over the past 24 hours -last dose was given around 5 PM 2/21.  Discussed the bone marrow biopsy results confirming multiple myeloma. Myeloma panel shows M spike of 4.1 g/dL.     OBJECTIVE:  NAD  PHYSICAL EXAMINATION: . Vitals:   04/07/20 1954 04/08/20 0544 04/08/20 2120 04/09/20 0651  BP: (!) 144/82 (!) 148/86 (!) 153/88 137/85  Pulse: (!) 110 93 (!) 109 93  Resp: 18  17 17   Temp: 98.1 F (36.7 C) 97.9 F (36.6 C) 98.4 F (36.9 C) 97.7 F (36.5 C)  TempSrc: Oral Oral Oral Oral  SpO2: 96% 97% 98% 98%  Weight:      Height:       Filed Weights   04/02/20 1600 04/02/20 2230 04/03/20 2326  Weight: 108.9 kg 108.9 kg 106.9 kg   .Body mass index is 40.45 kg/m.  GENERAL:alert, in no acute distress and comfortable SKIN: skin color, texture, turgor are normal, no rashes or significant lesions EYES: normal, conjunctiva are pink and non-injected, sclera clear OROPHARYNX:no exudate, no erythema and lips, buccal mucosa, and tongue normal  NECK: supple, no JVD, thyroid normal size, non-tender, without nodularity LYMPH:  no palpable lymphadenopathy in the cervical, axillary or inguinal LUNGS: clear to auscultation with normal respiratory effort HEART: regular rate & rhythm,  no murmurs and no lower extremity edema ABDOMEN: abdomen soft, non-tender, normoactive bowel sounds  Musculoskeletal: no cyanosis of digits and no clubbing  PSYCH: alert & oriented x 3 with fluent speech NEURO: no focal motor/sensory  deficits  MEDICAL HISTORY:  Past Medical History:  Diagnosis Date  . Back pain     SURGICAL HISTORY: History reviewed. No pertinent surgical history.  SOCIAL HISTORY: Social History   Socioeconomic History  . Marital status: Married    Spouse name: Not on file  . Number of children: Not on file  . Years of education: Not on file  . Highest education level: Not on file  Occupational History  . Not on file  Tobacco Use  . Smoking status: Current Every Day Smoker    Packs/day: 0.50    Years: 20.00    Pack years: 10.00    Types: Cigarettes  . Smokeless tobacco: Never Used  Vaping Use  . Vaping Use: Every day  Substance and Sexual Activity  . Alcohol use: Not Currently  . Drug use: Not Currently  . Sexual activity: Not on file  Other Topics Concern  . Not on file  Social History Narrative  . Not on file   Social Determinants of Health   Financial Resource Strain: Not on file  Food Insecurity: Not on file  Transportation Needs: Not on file  Physical Activity: Not on file  Stress: Not on file  Social Connections: Not on file  Intimate Partner Violence: Not on file    FAMILY HISTORY: Family History  Problem Relation Age of Onset  . Lung cancer Maternal Grandfather   . Pancreatic cancer Paternal Grandfather     ALLERGIES:  is allergic to  azithromycin.  MEDICATIONS:  Scheduled Meds: . amLODipine  5 mg Oral Daily  . cholecalciferol  2,000 Units Oral Daily  . dexamethasone  4 mg Oral Q breakfast  . fentaNYL  1 patch Transdermal Q72H  . gabapentin  300 mg Oral TID  . heparin  5,000 Units Subcutaneous Q8H  . magnesium oxide  200 mg Oral Daily  . metoprolol tartrate  50 mg Oral BID  . nicotine  21 mg Transdermal Daily  . polyethylene glycol  17 g Oral Daily  . potassium & sodium phosphates  1 packet Oral TID AC & HS  . potassium chloride  10 mEq Oral Daily  . senna-docusate  2 tablet Oral BID  . sodium chloride flush  3 mL Intravenous Q12H   Continuous  Infusions: . sodium chloride     PRN Meds:.albuterol, alteplase, heparin lock flush, heparin lock flush, labetalol, LORazepam, ondansetron **OR** ondansetron (ZOFRAN) IV, oxyCODONE, sodium chloride flush, sodium chloride flush  REVIEW OF SYSTEMS:    10 Point review of Systems was done is negative except as noted above.   LABORATORY DATA:  I have reviewed the data as listed  CBC    Component Value Date/Time   WBC 6.4 04/09/2020 0539   RBC 2.81 (L) 04/09/2020 0539   HGB 9.0 (L) 04/09/2020 0539   HCT 27.3 (L) 04/09/2020 0539   PLT 255 04/09/2020 0539   MCV 97.2 04/09/2020 0539   MCH 32.0 04/09/2020 0539   MCHC 33.0 04/09/2020 0539   RDW 14.6 04/09/2020 0539   LYMPHSABS 1.0 04/08/2020 0533   MONOABS 0.6 04/08/2020 0533   EOSABS 0.2 04/08/2020 0533   BASOSABS 0.0 04/08/2020 0533    CMP Latest Ref Rng & Units 04/09/2020 04/08/2020 04/07/2020  Glucose 70 - 99 mg/dL 145(H) 93 94  BUN 6 - 20 mg/dL 37(H) 41(H) 46(H)  Creatinine 0.44 - 1.00 mg/dL 0.98 1.11(H) 1.22(H)  Sodium 135 - 145 mmol/L 131(L) 133(L) 132(L)  Potassium 3.5 - 5.1 mmol/L 3.9 3.6 3.4(L)  Chloride 98 - 111 mmol/L 104 106 104  CO2 22 - 32 mmol/L 19(L) 19(L) 22  Calcium 8.9 - 10.3 mg/dL 8.5(L) 9.3 9.3  Total Protein 6.5 - 8.1 g/dL - - 8.7(H)  Total Bilirubin 0.3 - 1.2 mg/dL - - 0.7  Alkaline Phos 38 - 126 U/L - - 327(H)  AST 15 - 41 U/L - - 13(L)  ALT 0 - 44 U/L - - 31   Component     Latest Ref Rng & Units 04/03/2020         4:32 AM  IgG (Immunoglobin G), Serum     586 - 1,602 mg/dL 5,749 (H)  IgA     87 - 352 mg/dL 38 (L)  IgM (Immunoglobulin M), Srm     26 - 217 mg/dL 9 (L)  Total Protein ELP     6.0 - 8.5 g/dL 10.0 (H)  Albumin SerPl Elph-Mcnc     2.9 - 4.4 g/dL 3.1  Alpha 1     0.0 - 0.4 g/dL 0.5 (H)  Alpha2 Glob SerPl Elph-Mcnc     0.4 - 1.0 g/dL 1.0  B-Globulin SerPl Elph-Mcnc     0.7 - 1.3 g/dL 1.1  Gamma Glob SerPl Elph-Mcnc     0.4 - 1.8 g/dL 4.3 (H)  M Protein SerPl Elph-Mcnc     Not  Observed g/dL 4.1 (H)  Globulin, Total     2.2 - 3.9 g/dL 6.9 (H)  Albumin/Glob SerPl  0.7 - 1.7 0.5 (L)  IFE 1      Comment (A)  Please Note (HCV):      Comment  Albumin ELP     2.9 - 4.4 g/dL   Alpha-1-Globulin     0.0 - 0.4 g/dL   Alpha-2-Globulin     0.4 - 1.0 g/dL   Beta Globulin     0.7 - 1.3 g/dL   Gamma Globulin     0.4 - 1.8 g/dL   M-SPIKE, %     Not Observed g/dL   SPE Interp.        Comment        A/G Ratio     0.7 - 1.7    Component     Latest Ref Rng & Units 04/02/2020          IgG (Immunoglobin G), Serum     586 - 1,602 mg/dL   IgA     87 - 352 mg/dL   IgM (Immunoglobulin M), Srm     26 - 217 mg/dL   Total Protein ELP     6.0 - 8.5 g/dL   Albumin SerPl Elph-Mcnc     2.9 - 4.4 g/dL   Alpha 1     0.0 - 0.4 g/dL   Alpha2 Glob SerPl Elph-Mcnc     0.4 - 1.0 g/dL   B-Globulin SerPl Elph-Mcnc     0.7 - 1.3 g/dL   Gamma Glob SerPl Elph-Mcnc     0.4 - 1.8 g/dL   M Protein SerPl Elph-Mcnc     Not Observed g/dL   Globulin, Total     2.2 - 3.9 g/dL   Albumin/Glob SerPl     0.7 - 1.7   IFE 1        Please Note (HCV):        Albumin ELP     2.9 - 4.4 g/dL   Alpha-1-Globulin     0.0 - 0.4 g/dL   Alpha-2-Globulin     0.4 - 1.0 g/dL   Beta Globulin     0.7 - 1.3 g/dL   Gamma Globulin     0.4 - 1.8 g/dL   M-SPIKE, %     Not Observed g/dL   SPE Interp.        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 Plasma  cells show aberrant CD56 expression by  immunohistochemistry and are kappa-restricted by light chain in situ  hybridization. Plasma cells are increased on aspirate smears (12% by  manual differential count) and show atypical morphology. 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 Correlation with clinical findings, radiographic  studies, other laboratory data (eg. SPEP, immunofixation), and  cytogenetics/FISH results is recommended.   Preliminary findings were reported to K. Curcio on 04/05/20 at 1300 by S.  O'Neill.  RADIOGRAPHIC STUDIES: I have personally reviewed the radiological images as listed and agreed with the findings in the report. DG Chest 2 View  Result Date: 04/02/2020 CLINICAL DATA:  45 year old female with tachycardia and low grade fever. EXAM: CHEST - 2 VIEW COMPARISON:  03/10/2018 FINDINGS: The heart size and mediastinal contours are within normal limits. Both lungs are clear. The visualized skeletal structures are unremarkable. IMPRESSION: No acute cardiopulmonary process. Electronically Signed   By: Ruthann Cancer MD   On: 04/02/2020 09:09   CT L-SPINE NO CHARGE  Result Date: 04/02/2020 CLINICAL DATA:  Back pain.  Pathologic fracture with hypercalcemia. EXAM: CT LUMBAR SPINE WITHOUT CONTRAST TECHNIQUE: Multidetector CT imaging of the lumbar spine was performed without intravenous contrast administration. Multiplanar CT image reconstructions were also generated. COMPARISON:  Thoracolumbar spine radiographs 03/07/2020 FINDINGS: Segmentation: 5 lumbar type  vertebrae. Alignment: Straightening of the normal lumbar lordosis. No significant listhesis. Vertebrae: Widespread lytic bone lesions throughout the lumbar spine as well as included lower thoracic spine and pelvis. Pathologic T12 compression fracture with 50% vertebral body height loss, progressed from the prior radiographs. New pathologic L4 fracture involving the vertebral body and pedicles with 85% vertebral body height loss centrally and 6 mm retropulsion of the posterior vertebral cortex. Large lesion involving the left pedicle, pars, and transverse process of L1 with possible nondisplaced pathologic fracture. Paraspinal and other soft tissues: No acute paraspinal soft tissue abnormality. Intra-abdominal and pelvic contents reported separately. 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 IMPRESSION: 1. Widespread lytic bone lesions consistent with metastatic disease. 2. Pathologic T12 and L4 vertebral fractures as above. Possible nondisplaced pathologic fracture of the left L1 posterior elements. 3. 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 Electronically Signed   By: Logan Bores M.D.   On: 04/02/2020 12:52   DG CHEST PORT 1 VIEW  Result Date: 04/04/2020 CLINICAL DATA:  Multiple myeloma EXAM: PORTABLE CHEST 1 VIEW COMPARISON:  Portable exam 1812 hours compared to 04/02/2020 FINDINGS: Stable heart size and pulmonary vascularity. Prominent soft tissue at RIGHT mediastinal border just above the RIGHT hilum, increased in prominence since the previous exam, corresponding to paraspinal mass on prior CT study. Scattered interstitial infiltrates are seen in both lungs which could represent atypical infection or edema. No pleural effusion or pneumothorax. Bones demineralized. Bone destruction of the posterior RIGHT third rib identified. IMPRESSION: Scattered interstitial infiltrates, question edema versus atypical infection. Electronically Signed    By: Lavonia Dana M.D.   On: 04/04/2020 18:35   DG Bone Survey Met  Result Date: 04/02/2020 CLINICAL DATA:  Lytic lesion on x-ray.  Possible multiple myeloma. EXAM: METASTATIC BONE SURVEY COMPARISON:  CT of same day. FINDINGS: Ill-defined lucencies are noted throughout the skull which may represent lytic lesions. Lucencies are noted in the bilateral humeri. Lytic lesions are seen involving the right third and fourth ribs. Probable lytic lesion is seen involving the left fourth rib. Lytic lesions are noted  in the left scapula. Fractures are seen involving the T12 and L4 vertebral bodies consistent with pathologic fractures. Lucencies are seen throughout the pelvis and visualized proximal femurs consistent with lytic lesions. IMPRESSION: Multiple lytic lesions are noted consistent with multiple myeloma or metastatic disease. Pathologic fractures are seen involving the T12 and L4 vertebral bodies. Electronically Signed   By: Marijo Conception M.D.   On: 04/02/2020 15:29   CT BONE MARROW BIOPSY & ASPIRATION  Result Date: 04/03/2020 INDICATION: 45 year old female with newly diagnosed multiple myeloma. She presents for CT-guided bone marrow biopsy to assess for marrow involvement. EXAM: CT GUIDED BONE MARROW ASPIRATION AND CORE BIOPSY Interventional Radiologist:  Criselda Peaches, MD MEDICATIONS: None. ANESTHESIA/SEDATION: Moderate (conscious) sedation was employed during this procedure. A total of 4 milligrams versed and 150 micrograms fentanyl were administered intravenously. The patient's level of consciousness and vital signs were monitored continuously by radiology nursing throughout the procedure under my direct supervision. Total monitored sedation time: 18 minutes FLUOROSCOPY TIME:  None COMPLICATIONS: None immediate. Estimated blood loss: <25 mL PROCEDURE: Informed written consent was obtained from the patient after a thorough discussion of the procedural risks, benefits and alternatives. All questions  were addressed. Maximal Sterile Barrier Technique was utilized including caps, mask, sterile gowns, sterile gloves, sterile drape, hand hygiene and skin antiseptic. A timeout was performed prior to the initiation of the procedure. The patient was positioned prone and non-contrast localization CT was performed of the pelvis to demonstrate the iliac marrow spaces. Maximal barrier sterile technique utilized including caps, mask, sterile gowns, sterile gloves, large sterile drape, hand hygiene, and betadine prep. Under sterile conditions and local anesthesia, an 11 gauge coaxial bone biopsy needle was advanced into the right iliac marrow space. Needle position was confirmed with CT imaging. Initially, bone marrow aspiration was performed. Next, the 11 gauge outer cannula was utilized to obtain a right iliac bone marrow core biopsy. Needle was removed. Hemostasis was obtained with compression. The patient tolerated the procedure well. Samples were prepared with the cytotechnologist. IMPRESSION: Technically successful CT-guided bone marrow aspiration and core biopsy of the right iliac bone. Electronically Signed   By: Jacqulynn Cadet M.D.   On: 04/03/2020 19:33   ECHOCARDIOGRAM COMPLETE  Result Date: 04/06/2020    ECHOCARDIOGRAM REPORT   Patient Name:   JOSANNA HEFEL Ast Date of Exam: 04/06/2020 Medical Rec #:  [id number]         Height:       64.0 in Accession #:    [id number]        Weight:       235.7 lb Date of Birth:  Jul 09, 1975         BSA:          2.098 m Patient Age:    44 years          BP:           188/99 mmHg Patient Gender: F                 HR:           99 bpm. Exam Location:  Inpatient Procedure: 2D Echo, Cardiac Doppler and Color Doppler Indications:    Elevated Troponin  History:        Patient has no prior history of Echocardiogram examinations.  Sonographer:    Bernadene Person RDCS Referring Phys: [id number] [place]  1. Left ventricular ejection fraction, by estimation, is 60 to 65%.  The left ventricle  has normal function. The left ventricle has no regional wall motion abnormalities. Left ventricular diastolic parameters are consistent with Grade I diastolic dysfunction (impaired relaxation). Elevated left ventricular end-diastolic pressure.  2. Right ventricular systolic function is normal. The right ventricular size is normal. There is mildly elevated pulmonary artery systolic pressure. The estimated right ventricular systolic pressure is 25.9 mmHg.  3. The mitral valve is normal in structure. Mild mitral valve regurgitation. No evidence of mitral stenosis.  4. The aortic valve is normal in structure. Aortic valve regurgitation is not visualized. No aortic stenosis is present.  5. The inferior vena cava is normal in size with greater than 50% respiratory variability, suggesting right atrial pressure of 3 mmHg. FINDINGS  Left Ventricle: Left ventricular ejection fraction, by estimation, is 60 to 65%. The left ventricle has normal function. The left ventricle has no regional wall motion abnormalities. The left ventricular internal cavity size was normal in size. There is  no left ventricular hypertrophy. Left ventricular diastolic parameters are consistent with Grade I diastolic dysfunction (impaired relaxation). Elevated left ventricular end-diastolic pressure. Right Ventricle: The right ventricular size is normal. No increase in right ventricular wall thickness. Right ventricular systolic function is normal. There is mildly elevated pulmonary artery systolic pressure. The tricuspid regurgitant velocity is 2.88  m/s, and with an assumed right atrial pressure of 3 mmHg, the estimated right ventricular systolic pressure is 56.3 mmHg. Left Atrium: Left atrial size was normal in size. Right Atrium: Right atrial size was normal in size. Pericardium: There is no evidence of pericardial effusion. Mitral Valve: The mitral valve is normal in structure. There is mild thickening of the mitral valve  leaflet(s). Mild mitral valve regurgitation. No evidence of mitral valve stenosis. Tricuspid Valve: The tricuspid valve is normal in structure. Tricuspid valve regurgitation is mild . No evidence of tricuspid stenosis. Aortic Valve: The aortic valve is normal in structure. Aortic valve regurgitation is not visualized. No aortic stenosis is present. Pulmonic Valve: The pulmonic valve was normal in structure. Pulmonic valve regurgitation is not visualized. No evidence of pulmonic stenosis. Aorta: The aortic root is normal in size and structure. Venous: The inferior vena cava is normal in size with greater than 50% respiratory variability, suggesting right atrial pressure of 3 mmHg. IAS/Shunts: No atrial level shunt detected by color flow Doppler.  LEFT VENTRICLE PLAX 2D LVIDd:         5.08 cm  Diastology LVIDs:         3.00 cm  LV e' medial:    0.06 cm/s LV PW:         0.96 cm  LV E/e' medial:  17.5 LV IVS:        0.87 cm  LV e' lateral:   0.08 cm/s LVOT diam:     2.20 cm  LV E/e' lateral: 13.0 LV SV:         81 LV SV Index:   38 LVOT Area:     3.80 cm  RIGHT VENTRICLE RV S prime:     9.11 cm/s TAPSE (M-mode): 2.0 cm LEFT ATRIUM         Index LA diam:    3.30 cm 1.57 cm/m  AORTIC VALVE LVOT Vmax:   124.00 cm/s LVOT Vmean:  85.100 cm/s LVOT VTI:    0.212 m  AORTA Ao Root diam: 3.30 cm Ao Asc diam:  3.20 cm MITRAL VALVE                TRICUSPID  VALVE MV Area (PHT): 2.60 cm     TR Peak grad:   33.2 mmHg MV Decel Time: 292 msec     TR Vmax:        288.00 cm/s MV E velocity: 0.98 cm/s MV A velocity: 102.00 cm/s  SHUNTS MV E/A ratio:  0.01         Systemic VTI:  0.21 m                             Systemic Diam: 2.20 cm Ena Dawley MD Electronically signed by Ena Dawley MD Signature Date/Time: 04/06/2020/1:18:07 PM    Final    DG FEMUR PORT MIN 2 VIEWS LEFT  Result Date: 04/02/2020 CLINICAL DATA:  Bilateral thigh pain.  History of myeloma. EXAM: LEFT FEMUR PORTABLE 2 VIEWS COMPARISON:  Femur exam on bone  survey earlier today. Included portion from CT earlier today. FINDINGS: Lucent lesion in the lesser trochanter was better appreciated on CT earlier today. There are multiple small lucencies involving the femoral head and intertrochanteric femur that are not well seen by radiograph. No obvious destructive lesion in the femoral shaft. No evidence of pathologic fracture. IMPRESSION: Proximal femoral lesions including a lesion of the lesser trochanter, majority of these are better seen on CT earlier today. There is no evidence of acute femur fracture or large destructive lesion. Consider MRI for more detailed assessment. Electronically Signed   By: Keith Rake M.D.   On: 04/02/2020 21:21   DG FEMUR PORT, MIN 2 VIEWS RIGHT  Result Date: 04/02/2020 CLINICAL DATA:  Right thigh pain.  Myeloma. EXAM: RIGHT FEMUR PORTABLE 2 VIEW COMPARISON:  Included portion from bone survey earlier today. Included portion from pelvis CT earlier today. FINDINGS: Lucent lesion in the intertrochanteric femur on CT earlier today is only faintly visualized, this is at risk for pathologic fracture. There is some thinning of the lateral cortex of the greater trochanter. There are multiple additional small lucencies in the femoral head on CT that are not well seen by radiograph. Small lesion noted in the distal lateral aspect of the femur in the region of the physeal scar. No evidence of acute fracture. IMPRESSION: Relatively large lucent lesion in the intertrochanteric femur on CT earlier today is only faintly visualized, this is at risk for pathologic fracture. There are multiple additional lucencies in the femoral head on CT are not well seen by radiograph. There is a small lesion in the distal lateral femoral metaphysis. MRI may be of value for more detailed assessment. Electronically Signed   By: Keith Rake M.D.   On: 04/02/2020 21:23   CT CHEST ABDOMEN PELVIS WO CONTRAST  Result Date: 04/02/2020 CLINICAL DATA:  Thoracic and  lumbar spine pain. Pathologic fracture with hypercalcemia. EXAM: CT CHEST, ABDOMEN AND PELVIS WITHOUT CONTRAST TECHNIQUE: Multidetector CT imaging of the chest, abdomen and pelvis was performed following the standard protocol without IV contrast. COMPARISON:  Chest radiographs 04/02/2020. Thoracolumbar spine radiographs 03/07/2020. FINDINGS: CT CHEST FINDINGS Cardiovascular: Normal caliber of the thoracic aorta. Normal heart size. No pericardial effusion. Mediastinum/Nodes: No enlarged axillary, mediastinal, or hilar lymph nodes are identified with hilar assessment limited in the absence of IV contrast. Unremarkable visualized thyroid and esophagus. Lungs/Pleura: No pleural effusion or pneumothorax. Patchy and hazy ground-glass opacities in the left greater than right upper lobes. No parenchymal lung mass. Musculoskeletal: Widespread lytic bone lesions throughout the visualized skeleton including a destructive 5 cm lesion involving the posterior  right third rib and a 3 cm destructive lesion involving the posteromedial right sixth rib. Smaller destructive lesions with nondisplaced pathologic fractures of the posterolateral right fourth and posterior left fourth ribs. Large lesion involving much of the T1 vertebral body with disruption of the posterior vertebral body cortex but no gross epidural tumor. Pathologic vertebral body fractures with mild vertebral body height loss at T7 and T8 and moderate height loss at T12. CT ABDOMEN PELVIS FINDINGS Hepatobiliary: No focal liver abnormality is seen. No gallstones, gallbladder wall thickening, or biliary dilatation. Pancreas: Unremarkable. Spleen: Unremarkable. Adrenals/Urinary Tract: Unremarkable adrenal glands. No evidence of renal mass, calculi, or hydronephrosis. Unremarkable bladder. Stomach/Bowel: The stomach is moderately distended by fluid and is otherwise unremarkable. There is no evidence of bowel obstruction or inflammation. The appendix is unremarkable.  Vascular/Lymphatic: Normal caliber of the abdominal aorta. No enlarged lymph nodes. Reproductive: Grossly unremarkable uterus and ovaries. Other: No ascites. Musculoskeletal: Widespread lytic bone lesions throughout the spine and pelvis with the lumbar spine evaluated in detail on the separate dedicated spine CT. IMPRESSION: 1. Widespread lytic lesions throughout the visualized skeleton consistent with metastatic disease or multiple myeloma. No definite primary malignancy identified in the chest, abdomen, or pelvis. 2. Nonspecific pulmonary ground-glass opacities in the left greater than right upper lobes, likely infectious or inflammatory. Electronically Signed   By: Logan Bores M.D.   On: 04/02/2020 13:12    ASSESSMENT & PLAN:   45 year old very pleasant lady with  1) Newly diagnosed multiple myeloma with extensive bone lesions 2) hypercalcemia due to multiple myeloma-now resolved with IV fluids, calcitonin, pamidronate. 3) anemia due to multiple myeloma becoming more apparent as the patient's hemoconcentration due to dehydration has been corrected.   4) acute renal failure related to dehydration hypercalcemia and multiple myeloma.  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 -Continue gabapentin 300 mg twice a day for radicular pain. -Continue current bowel regimen. -Started on vitamin D 2000 units daily for vitamin D deficiency -Hemoglobin stable transfuse as needed for hemoglobin less than 7.5. -Continue radiation under the care of Dr. Lisbeth Renshaw. -The patient received day 1 of cycle #1 of CyBorD on 04/08/2020.  She  tolerated it well.  She is due for day 4 on 04/11/2020 which will be subcu Velcade only. -We will consider adding antiviral once renal function continues to improve. -She will need outpatient PET/CT scan and referral to IR for consideration of osteocool ablation and vertebroplasty for vertebral pathologic fractures.  She will also need referral to [facility] with orthopedic oncology to evaluate risk of femoral fractures. -PT has seen the patient and they recommend home with home health PT -Recommend smoking cessation and nicotine avoidance. -From our standpoint, the patient may discharged home.  I have sent a scheduling message to arrange for Velcade 2/24, lab, MD visit, and chemo on 2/28, and Velcade on 3/3.  Mikey Bussing, DNP, AGPCNP-BC, AOCNP   ADDENDUM  .Patient was Personally and independently interviewed, examined and relevant elements of the history of present illness were reviewed in details and an assessment and plan was created. All elements of the patient's history of present illness , assessment and plan were discussed in  details with Mikey Bussing, DNP, AGPCNP-BC, AOCNP. The above documentation reflects our combined findings assessment and plan.  -scheduling message sent to schedule continued treatment in clinic.  Sullivan Lone MD MS

## 2020-04-09 NOTE — Discharge Instructions (Signed)
Acute Kidney Injury, Adult  Acute kidney injury is a sudden worsening of kidney function. The kidneys are organs that have several jobs. They filter the blood to remove waste products and extra fluid. They also maintain a healthy balance of minerals and hormones in the body, which helps control blood pressure and keep bones strong. With this condition, your kidneys do not do their jobs as well as they should. This condition ranges from mild to severe. Over time, it may develop into long-lasting (chronic) kidney disease. Early detection and treatment may prevent acute kidney injury from developing into a chronic condition. What are the causes? Common causes of this condition include:  A problem with blood flow to the kidneys. This may be caused by: ? Low blood pressure (hypotension) or shock. ? Blood loss. ? Heart and blood vessel (cardiovascular) disease. ? Severe burns. ? Liver disease.  Direct damage to the kidneys. This may be caused by: ? Certain medicines. ? A kidney infection. ? Poisoning. ? Being around or in contact with toxic substances. ? A surgical wound. ? A hard, direct hit to the kidney area.  A sudden blockage of urine flow. This may be caused by: ? Cancer. ? Kidney stones. ? An enlarged prostate in males. What increases the risk? You are more likely to develop this condition if you:  Are older than age 30.  Are female.  Are hospitalized, especially if you are in critical condition.  Have certain conditions, such as: ? Chronic kidney disease. ? Diabetes. ? Coronary artery disease and heart failure. ? Pulmonary disease. ? Chronic liver disease. What are the signs or symptoms? Symptoms of this condition may not be obvious until the condition becomes severe. Symptoms of this condition can include:  Tiredness (lethargy) or difficulty staying awake.  Nausea or vomiting.  Swelling (edema) of the face, legs, ankles, or feet.  Problems with urination, such  as: ? Pain in the abdomen, or pain along the side of your stomach (flank). ? Producing little or no urine. ? Passing urine with a weak flow.  Muscle twitches and cramps, especially in the legs.  Confusion or trouble concentrating.  Loss of appetite.  Fever. How is this diagnosed? Your health care provider can diagnose this condition based on your symptoms, medical history, and a physical exam.  You may also have other tests, such as:  Blood tests.  Urine tests.  Imaging tests.  A test in which a sample of tissue is removed from the kidneys to be examined under a microscope (kidney biopsy). How is this treated? Treatment for this condition depends on the cause and how severe the condition is. In mild cases, treatment may not be needed. The kidneys may heal on their own. In more severe cases, treatment will involve:  Treating the cause of the kidney injury. This may involve changing any medicines you are taking or adjusting your dosage.  Fluids. You may need specialized IV fluids to balance your body's needs.  Having a catheter placed to drain urine and prevent blockages.  Preventing problems from occurring. This may mean avoiding certain medicines or procedures that can cause further injury to the kidneys. In some cases, treatment may also require:  A procedure to remove toxic wastes from the body (dialysis or continuous renal replacement therapy, CRRT).  Surgery. This may be done to repair a torn kidney or to remove the blockage from the urinary system. Follow these instructions at home: Medicines  Take over-the-counter and prescription medicines only as  told by your health care provider.  Do not take any new medicines without your health care provider's approval. Many medicines can worsen your kidney damage.  Do not take any vitamin and mineral supplements without your health care provider's approval. Many nutritional supplements can worsen your kidney  damage. Lifestyle  If your health care provider prescribed changes to your diet, follow them. You may need to decrease the amount of protein you eat.  Achieve and maintain a healthy weight. If you need help with this, ask your health care provider.  Start or continue an exercise plan. Try to exercise at least 30 minutes a day, 5 days a week.  Do not use any products that contain nicotine or tobacco, such as cigarettes, e-cigarettes, and chewing tobacco. If you need help quitting, ask your health care provider.   General instructions  Keep track of your blood pressure. Report changes in your blood pressure as told by your health care provider.  Stay up to date with your vaccines. Ask your health care provider which vaccines you need.  Keep all follow-up visits as told by your health care provider. This is important.   Where to find more information  American Association of Kidney Patients: BombTimer.gl  National Kidney Foundation: www.kidney.[place]: https://mathis.com/  Life Options Rehabilitation Program: ? www.lifeoptions.org ? www.kidneyschool.org Contact a health care provider if:  Your symptoms get worse.  You develop new symptoms. Get help right away if:  You develop symptoms of worsening kidney disease, which include: ? Headaches. ? Abnormally dark or light skin. ? Easy bruising. ? Frequent hiccups. ? Chest pain. ? Shortness of breath. ? End of menstruation in women. ? Seizures. ? Confusion or altered mental status. ? Abdominal or back pain. ? Itchiness.  You have a fever.  Your body is producing less urine.  You have pain or bleeding when you urinate. Summary  Acute kidney injury is a sudden worsening of kidney function.  Acute kidney injury can be caused by problems with blood flow to the kidneys, direct damage to the kidneys, and sudden blockage of urine flow.  Symptoms of this condition may not be obvious until it becomes severe.  Symptoms may include edema, lethargy, confusion, nausea or vomiting, and problems passing urine.  This condition can be diagnosed with blood tests, urine tests, and imaging tests. Sometimes a kidney biopsy is done to diagnose this condition.  Treatment for this condition often involves treating the underlying cause. It is treated with fluids, medicines, diet changes, dialysis, or surgery. This information is not intended to replace advice given to you by your health care provider. Make sure you discuss any questions you have with your health care provider. Document Revised: 12/13/2018 Document Reviewed: 12/13/2018 Elsevier Patient Education  2021 [place].   Hypercalcemia Hypercalcemia is when the level of calcium in a person's blood is above normal. The body needs calcium to make bones and keep them strong. Calcium also helps the muscles, nerves, brain, and heart work the way they should. Most of the calcium in the body is in the bones. There is also some calcium in the blood. Hypercalcemia can happen when calcium comes out of the bones, or when the kidneys are not able to remove calcium from the blood. Hypercalcemia can be mild or severe. What are the causes? There are many possible causes of hypercalcemia. Common causes of this condition include:  Hyperparathyroidism. This is a condition in which the body produces too much parathyroid hormone. There  are four parathyroid glands in your neck. These glands produce a chemical messenger (hormone) that helps the body absorb calcium from foods and helps your bones release calcium.  Certain kinds of cancer. Less common causes of hypercalcemia include:  Getting too much calcium or vitamin D from your diet.  Kidney failure.  Hyperthyroidism.  Severe dehydration.  Being on bed rest or being inactive for a long time.  Certain medicines.  Infections. What increases the risk? You are more likely to develop this condition if you:  Are  female.  Are 23 years of age or older.  Have a family history of hypercalcemia. What are the signs or symptoms? Mild hypercalcemia that starts slowly may not cause symptoms. Severe, sudden hypercalcemia is more likely to cause symptoms, such as:  Being more thirsty than usual.  Needing to urinate more often than usual.  Abdominal pain.  Nausea and vomiting.  Constipation.  Muscle pain, twitching, or weakness.  Feeling very tired. How is this diagnosed? Hypercalcemia is usually diagnosed with a blood test. You may also have tests to help determine what is causing this condition, such as imaging tests and more blood tests.   How is this treated? Treatment for hypercalcemia depends on the cause. Treatment may include:  Receiving fluids through an IV.  Medicines that: ? Keep calcium levels steady after receiving fluids (loop diuretics). ? Keep calcium in your bones (bisphosphonates). ? Lower the calcium level in your blood.  Surgery to remove overactive parathyroid glands.  A procedure that filters your blood to correct calcium levels (hemodialysis). Follow these instructions at home:  Take over-the-counter and prescription medicines only as told by your health care provider.  Follow instructions from your health care provider about eating or drinking restrictions.  Drink enough fluid to keep your urine pale yellow.  Stay active. Weight-bearing exercise helps to keep calcium in your bones. Follow instructions from your health care provider about what type and level of exercise is safe for you.  Keep all follow-up visits as told by your health care provider. This is important.   Contact a health care provider if you have:  A fever.  A heartbeat that is irregular or very fast.  Changes in mood, memory, or personality. Get help right away if you:  Have severe abdominal pain.  Have chest pain.  Have trouble breathing.  Become very confused and sleepy.  Lose  consciousness. Summary  Hypercalcemia is when the level of calcium in a person's blood is above normal. The body needs calcium to make bones and keep them strong. Calcium also helps the muscles, nerves, brain, and heart work the way they should.  There are many possible causes of hypercalcemia, and treatment depends on the cause.  Take over-the-counter and prescription medicines only as told by your health care provider.  Follow instructions from your health care provider about eating or drinking restrictions. This information is not intended to replace advice given to you by your health care provider. Make sure you discuss any questions you have with your health care provider. Document Revised: 03/01/2018 Document Reviewed: 11/08/2017 Elsevier Patient Education  2021 [place].   Multiple Myeloma  Multiple myeloma is a form of cancer. It develops when abnormal plasma cells grow out of control. Plasma cells are a type of white blood cell that is made in the soft tissue inside the bones (bone marrow). They are part of the body's disease-fighting system (immune system). Multiple myeloma damages bones and causes other health problems  because of its effect on blood cells. Abnormal plasma cells produce monoclonal proteins (M proteins) and interfere with many important functions that normal cells perform in the body. The disease gets worse over time (progresses) and reduces the body's ability to fight infections. What are the causes? The cause of multiple myeloma is not known. What increases the risk? You are more likely to develop this condition if you:  Are older than age 80.  Are female.  Are African American.  Have a family history of multiple myeloma.  Have a history of monoclonal gammopathy of undetermined significance (MGUS).  Have a history of radiation exposure.  Have been exposed to certain chemicals, such as benzene or pesticides. What are the signs or symptoms? Signs and  symptoms of multiple myeloma may include:  Bone pain, especially in the back, ribs, and hips.  Broken bones (fractures).  Having a low level of red blood cells (anemia), white blood cells (leukopenia), and platelets (thrombocytopenia). Platelets are cells that help blood to clot so a wound does not keep bleeding.  Fatigue.  Weakness.  Infections.  Unusual bleeding, such as: ? Bleeding from the nose or gums. ? Bleeding a lot from a small scrape or cut.  High blood calcium levels.  Increased urination.  Confusion.  Shortness of breath.  Weakness or numbness in your legs.  Sudden, severe back pain. How is this diagnosed? This condition is diagnosed based on your symptoms, your medical history, and a physical exam. You will have blood and urine tests to confirm that M proteins are present. You may also have other tests, including:  Additional blood tests.  X-rays.  MRI.  CT scan.  PET scan.  Tests to check the function of your kidneys.  Heart tests, such as an echocardiogram. An echocardiogram uses sound waves to produce an image of the heart.  A procedure to remove a sample of bone marrow (bone marrow biopsy). The sample is examined for abnormal plasma cells. How is this treated? There is no cure for multiple myeloma. However, treatments can manage symptoms and slow the progression of the disease. Treatment options may vary depending on how much the disease has advanced. Possible treatment options may include:  Medicines that kill cancer cells (chemotherapy).  Radiation therapy. This is the use of high-energy rays to kill cancer cells.  A bone marrow transplant. This procedure replaces diseased bone marrow with healthy bone marrow (stem cell transplant).  Medicines that block the growth and spread of cancer cells (targeted drug therapy).  Medicines that strengthen your immune system's ability to fight cancer cells (immunotherapy or biologic  therapy).  Participating in clinical trials to find out if new (experimental) treatments are effective.  Medicines that help to prevent bone damage (bisphosphonates).  Medicines that reduce swelling (corticosteroids).  Surgery to repair bone damage.  A procedure to remove plasma cells from your blood (plasmapheresis).  Other medicines to treat problems such as infections or pain. Follow these instructions at home: Eating and drinking  Drink enough fluid to keep your urine pale yellow.  Try to eat healthy meals on a regular basis. Some of your treatments might affect your appetite. If you are having problems eating or if you do not have an appetite, meet with a diet and nutrition specialist (dietitian).  Take vitamins or supplements only as told by your health care provider or dietitian. Some vitamins and supplements may interfere with how well your treatment works.   General instructions  Take over-the-counter and prescription medicines  only as told by your health care provider.  Stay active. Talk with your health care provider about what types of exercises and activities are safe for you. ? Avoid activities that cause increased pain. ? Do not lift anything that is heavier than 10 lb (4.5 kg), or the limit that you are told, until your health care provider says that it is safe.  Consider joining a support group or getting counseling to help you cope with the stress of having multiple myeloma.  Keep all follow-up visits as told by your health care provider. This is important. Where to find more information  American Cancer Society: www.cancer.org  Leukemia and Lymphoma Society: www.LLS.[place]): www.cancer.gov Contact a health care provider if you:  Have pain that gets worse or does not get better with medicine.  Have a fever.  Have swollen legs.  Have weakness or dizziness.  Have unexplained weight loss.  Have unexplained bleeding or  bruising.  Have a cough or symptoms of the common cold.  Feel depressed.  Have changes in urination or bowel movements. Get help right away if you:  Have sudden severe pain, especially back pain.  Have numbness or weakness in your arms, hands, legs, or feet.  Become very confused.  Have weakness on one side of your body.  Have slurred speech.  Have trouble staying awake.  Have shortness of breath.  Have blood in your stool (feces) or urine.  Vomit blood or cough up blood. Summary  Multiple myeloma is a form of cancer. It develops when abnormal plasma cells grow out of control.  There is no cure for multiple myeloma. However, treatments can manage symptoms and slow the progression of the disease. Treatment options may vary depending on how much the disease has advanced.  Do not lift anything that is heavier than 10 lb (4.5 kg), or the limit that you are told, until your health care provider says that it is safe.  Contact your health care provider if you have any new symptoms or sudden severe pain, especially back pain. This information is not intended to replace advice given to you by your health care provider. Make sure you discuss any questions you have with your health care provider. Document Revised: 06/26/2019 Document Reviewed: 06/26/2019 Elsevier Patient Education  [place].

## 2020-04-09 NOTE — TOC Initial Note (Signed)
Transition of Care [facility]) - Initial/Assessment Note    Patient Details  Name: Stacey Montoya MRN: [id number] Date of Birth: 09-06-1975  [facility] CM/SW Contact:    Lynnell Catalan, RN Phone Number: 04/09/2020, 2:14 PM  Clinical Narrative:                 Pt declined home health services at this time. She states someone is home with her all the time. RW ordered from [place].  Expected Discharge Plan: Home/Self Care Barriers to Discharge: Continued Medical Work up   Patient Goals and CMS Choice Patient states their goals for this hospitalization and ongoing recovery are:: To get home      Expected Discharge Plan and Services Expected Discharge Plan: Home/Self Care   Discharge Planning Services: CM Consult   Living arrangements for the past 2 months: Single Family Home Expected Discharge Date: 04/09/20               DME Arranged: Gilford Rile rolling DME Agency: [employer] Date DME Agency Contacted: 04/09/20   Representative spoke with at DME Agency: Freda Munro            Prior Living Arrangements/Services Living arrangements for the past 2 months: [place] Lives with:: Spouse Patient language and need for interpreter reviewed:: Yes Do you feel safe going back to the place where you live?: Yes      Need for Family Participation in Patient Care: Yes (Comment) Care giver support system in place?: Yes (comment)   Criminal Activity/Legal Involvement Pertinent to Current Situation/Hospitalization: No - Comment as needed  Activities of Daily Living Home Assistive Devices/Equipment: Cane (specify quad or straight),Walker (specify type),Eyeglasses ADL Screening (condition at time of admission) Patient's cognitive ability adequate to safely complete daily activities?: Yes Is the patient deaf or have difficulty hearing?: No Does the patient have difficulty seeing, even when wearing glasses/contacts?: No Does the patient have difficulty concentrating,  remembering, or making decisions?: No Patient able to express need for assistance with ADLs?: Yes Does the patient have difficulty dressing or bathing?: Yes Independently performs ADLs?: No Communication: Independent Dressing (OT): Needs assistance Is this a change from baseline?: Change from baseline, expected to last >3 days Grooming: Independent Feeding: Independent Bathing: Needs assistance Is this a change from baseline?: Change from baseline, expected to last >3 days Toileting: Needs assistance Is this a change from baseline?: Change from baseline, expected to last >3days In/Out Bed: Needs assistance Is this a change from baseline?: Change from baseline, expected to last >3 days Walks in Home: Independent with device (comment) Does the patient have difficulty walking or climbing stairs?: Yes Weakness of Legs: Both Weakness of Arms/Hands: None  Permission Sought/Granted   Permission granted to share information with : Yes, Verbal Permission Granted              Emotional Assessment Appearance:: Appears stated age     Orientation: : Oriented to Self,Oriented to Place,Oriented to  Time,Oriented to Situation Alcohol / Substance Use: Not Applicable    Admission diagnosis:  Hypercalcemia [E83.52] Back pain [M54.9] Tachycardia [R00.0] Pain [R52] Hyperglycemia [R73.9] 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 Encounter for antineoplastic chemotherapy   . Counseling regarding advance care planning and goals of care 04/05/2020  . Drug-induced constipation   .  Neoplasm related pain   . 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 Lytic bone lesions on xray 04/02/2020  . Hypertensive urgency 04/02/2020  . Normocytic anemia  04/02/2020  . Acute renal failure (ARF) ([place]) 04/02/2020  . Hyponatremia 04/02/2020  . Multiple myeloma ([place])    PCP:  Celene Squibb, MD Pharmacy:   [facility] [place] [address] [place] [address] Phone: [phone] Fax: [phone]     Social Determinants of Health (SDOH) Interventions    Readmission Risk Interventions Readmission Risk Prevention Plan 04/09/2020  Transportation Screening Complete  Medication Review ([place]) Complete  PCP or Specialist appointment within 3-5 days of discharge Complete  HRI or Home Care Consult Patient refused  SW Recovery Care/Counseling Consult Complete  Palliative Care Screening Not [place] Not Applicable

## 2020-04-09 NOTE — Discharge Summary (Addendum)
Discharge Summary  Stacey Montoya MOQ:[id number] DOB: [birth year]  PCP: Celene Squibb, MD  Admit date: 04/02/2020 Discharge date: 04/09/2020  Time spent: 35 minutes.  Recommendations for Outpatient Follow-up:  1. Follow-up with medical oncology within 1 week. 2. Follow up with interventional radiology, Dr. Earleen Newport, for possible osteocool ablation and vertebroplasty for vertebral pathologic fractures. 3. Follow up with orthopedic oncology, Dr Janice Coffin, on 05/06/20 at 3:20 pm. 4. Follow-up with radiation oncology in 1 to 2 weeks. 5. Follow-up with your PCP in 1 to 2 weeks. 6. Take your medications as prescribed. 7. Continue fall precautions.  Discharge Diagnoses:  Active Hospital Problems   Diagnosis Date Noted  . 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 Encounter for antineoplastic chemotherapy   . Drug-induced constipation   . Neoplasm related pain   . Lytic bone lesions on xray 04/02/2020  . Hypertensive urgency 04/02/2020  . Normocytic anemia 04/02/2020  . Acute renal failure (ARF) ([place]) 04/02/2020  . Hyponatremia 04/02/2020  . Multiple myeloma [facility])     Resolved Hospital Problems  No resolved problems to display.    Discharge Condition: Stable.  Diet recommendation: Resume previous diet.  Vitals:   04/08/20 2120 04/09/20 0651  BP: (!) 153/88 137/85  Pulse: (!) 109 93  Resp: 17 17  Temp: 98.4 F (36.9 C) 97.7 F (36.5 C)  SpO2: 98% 98%    History of present illness:  This is a 45 year old female with history of smoking but no other medical problems, comes to the hospital with severe back pain. This has been going on and progressively getting worse over the last couple of months. She is seeing a chiropractor as an outpatient, also saw Dr Legrand Pitts neurosurgery due toaT12 compression fracture, treated conservatively. She was having worsening low back pain as well as left hip pain and eventually could not take it anymore and came to the hospital. She  was found to be hypercalcemic with a serum calcium of 14.5, and a CT scan showed widespread lytic lesions consistent with metastatic disease or multiple myeloma. Oncology consulted.  She received pamidronate on 04/03/2020 for hypercalcemia of malignancy and aggressive IV fluid with improvement.  She underwent a bone marrow biopsy on 04/03/2020 by IR, Dr. Chevis Pretty.  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 Transferred to [facility] to start urgent radiation therapy.  First radiation therapy 04/04/2020 by Dr. Lisbeth Renshaw.  She received chemotherapy, day 1 of cycle 1 of CyBorD, on 04/08/20 and tolerated it well overall.  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  04/09/20: Seen at bedside.  No new complaints.  No nausea, tolerating a diet well.  Ok to discharge from oncology standpoint.  Vital signs and labs reviewed and are stable.  Hospital Course:  Principal Problem:   Hypercalcemia of malignancy Active Problems:   Lytic bone lesions on xray   Hypertensive urgency   Normocytic anemia   Acute renal failure (ARF) ([facility])   Hyponatremia   Multiple myeloma ([facility])   Drug-induced constipation   Neoplasm related pain   Encounter for antineoplastic chemotherapy  Resolved severe hypercalcemia of malignancy. Presented with serum calcium 14.5 corrected for albumin, calcium of 15.9.  And widespread lytic lesions consistent with multiple myeloma. She received pamidronate on 04/03/2020 as well as aggressive IV fluid hydration. Hypercalcemia resolved and serum Ca2+ currently 9.0. IV fluid was stopped on 04/04/2020 due to dyspnea,  acute hypoxemia, and pulmonary edema seen on chest x-ray. Follow up with oncology and PCP  Newly diagnosed multiple myeloma Post bone marrow biopsy on 04/03/2020, which confirmed multiple myeloma. Myeloma panel shows M spike of 4.1. 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 Started first round of radiation on 04/04/2020 by Dr. Lisbeth Renshaw.  -Imaging of the hip on the rightsideshowed large lucent lesion in the intertrochanteric femur, increased risk for pathological fracture.   Started on p.o. Decadron 4 mg daily while on radiation by oncology. Follow up with Dr. Mylo Red at [facility], appointment made for you on 05/06/20 at 3:20 pm.  They will contact you if one can be made sooner.  Left hip pain likely secondary to multiple myeloma lytic lesions Pain control in place -Continue fentanyl patch at 25 mcg/h with as needed oxycodone. -Continue gabapentin 300 mg twice a day for radicular pain. -Continue current bowel regimen. -Started on vitamin D 2000 units daily for vitamin D deficiency Appreciate oncology's assistance.  Vertebral pathologic fractures T12, L4 and possible L1 secondary to multiple myeloma She will need outpatient PET/CT scan and referral to IR for consideration of osteocool ablation and vertebroplasty for vertebral pathologic fractures.  Resolved acute hypoxic respiratory failure secondary to acute pulmonary edema Likely from IV fluid Personally reviewed chest x-ray done on 04/04/2020 which shows increasing pulmonary vascularity suggestive of pulmonary edema. Held off IV fluid on 02/01/2021 evening Received 2 doses of IV Lasix on 04/04/2020 and 04/05/2020 Currently oxygen saturation 98% on room air.  Elevated troponin suspect demand ischemia in the setting of sinus tachycardia No evidence of acute ischemia  on twelve-lead EKG. Troponin downtrended, peaked at 29. 2D echo done on 2020-04-06 showed normal LVEF 60 to 65% with grade 1 diastolic dysfunction. Tachycardia has resolved.  Elevated D-dimer in the setting of pleuritic pain Acute hypoxia is resolved and D-dimer downtrended.  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 Creatinine back to baseline 0.9 with GFR greater than 60. Continue to avoid nephrotoxins. Net I&O -7.8 L  Essential hypertension BP is currently at goal BP 137/85, heart rate 93, respiration rate 17, O2 saturation 98% on room air. Continue Norvasc 5 mg daily Continue metoprolol Tartrate 50 mg twice daily. Follow-up with your PCP in 1 to 2 weeks.  Hypovolemic hyponatremia, likely secondary to dehydration from hypercalcemia Serum sodium 131 Follow-up with your PCP.  Mild normal anion gap metabolic acidosis likely secondary to renal insufficiency. Received IV fluid hydration.  Worsening isolated elevated alkaline phosphatase, suspect related to lytic lesions from multiple myeloma. AST ALT, T bilirubin normal Follow-up with oncology.  Resolved leukocytosis, suspect reactive in the setting of steroid use WBC 6.4K from 15.9K Nonseptic appearing UA 04/02/2020 negative for pyuria No acute cardiopulmonary disease on chest x-ray done 04/02/2020.  Anemia of chronic disease in the setting of newly diagnosed multiple myeloma. Hemoglobin 9.0 from 1.5 Follow-up with hematology oncology.  Ambulatory dysfunction in the setting of multiple myeloma PT OT recommended home health PT OT. Continue fall precautions due to multiple lytic lesions increasing risks of pathological fractures.  Code Status: Full code.  Family Communication: Updated husband via phone on 04/08/2020.   Consultants:  Medical oncology  Radiation oncology  Procedures:  Radiation  planned on 04/04/2020.  Antimicrobials:  None.   Discharge Exam: BP 137/85 (BP Location: Left Arm)   Pulse 93   Temp 97.7 F (36.5 C) (Oral)   Resp 17   Ht 5' 4"  (1.626 m)   Wt 106.9 kg   LMP 03/23/2020   SpO2 98%   BMI 40.45 kg/m  . General: 45 y.o. year-old female well developed well nourished in no acute distress.  Alert and oriented x3. . Cardiovascular: Regular rate and rhythm with no rubs or gallops.  No thyromegaly or JVD noted.   Marland Kitchen Respiratory: Clear to auscultation with no wheezes or rales. Good inspiratory effort. . Abdomen: Soft nontender nondistended with normal bowel sounds x4 quadrants. . Musculoskeletal: No lower extremity edema bilaterally. . Skin: No ulcerative lesions noted or rashes. . Psychiatry: Mood is appropriate for condition and setting  Discharge Instructions You were cared for by a hospitalist during your hospital stay. If you have any questions about your discharge medications or the care you received while you were in the hospital after you are discharged, you can call the unit and asked to speak with the hospitalist on call if the hospitalist that took care of you is not available. Once you are discharged, your primary care physician will handle any further medical issues. Please note that NO REFILLS for any discharge medications will be authorized once you are discharged, as it is imperative that you return to your primary care physician (or establish a relationship with a primary care physician if you do not have one) for your aftercare needs so that they can reassess your need for medications and monitor your lab values.  Discharge Instructions    Physician communication order   Complete by: As directed    Dexamethasone 40 mg IV to be given in clinic prior to Velcade.   TREATMENT CONDITIONS   Complete by: As directed    Patient should have CBC & CMP within 7 days prior to chemotherapy administration. NOTIFY MD IF: ANC < 1500, Hemoglobin < 8, PLT <  100,000,  Total Bili > 1.5, Creatinine > 1.5, ALT & AST > 80 or if patient has unstable vital signs: Temperature > 38.5, SBP > 180 or < 90, RR > 30 or HR > 100.     Allergies as of 04/09/2020      Reactions   Azithromycin Rash      Medication List    STOP taking these medications   cyclobenzaprine 10 MG tablet Commonly known as: FLEXERIL   oxyCODONE-acetaminophen 5-325 MG tablet Commonly known as: PERCOCET/ROXICET     TAKE these medications   acyclovir 400 MG tablet Commonly known as: ZOVIRAX Take 1 tablet (400 mg total) by mouth 2 (two) times daily.   albuterol 108 (90 Base) MCG/ACT inhaler Commonly known as: VENTOLIN HFA Inhale 1-2 puffs into the lungs as needed for wheezing.   amLODipine 5 MG tablet Commonly known as: NORVASC Take 1 tablet (5 mg total) by mouth daily. Start taking on: April 10, 2020   dexamethasone 4 MG tablet Commonly known as: DECADRON Take 1 tablet (4 mg total) by mouth daily.   fentaNYL 25 MCG/HR Commonly known as: [place] 1 patch onto the skin every 3 (three) days for 6 days.   gabapentin 300 MG capsule Commonly known as: NEURONTIN Take 1 capsule (300 mg  total) by mouth 2 (two) times daily.   LORazepam 0.5 MG tablet Commonly known as: Ativan Take 1 tablet (0.5 mg total) by mouth every 6 (six) hours as needed (Nausea or vomiting).   metoprolol tartrate 50 MG tablet Commonly known as: LOPRESSOR Take 1 tablet (50 mg total) by mouth 2 (two) times daily.   nicotine 21 mg/24hr patch Commonly known as: NICODERM CQ - dosed in mg/24 hours Place 1 patch (21 mg total) onto the skin daily. Start taking on: April 10, 2020   norethindrone 0.35 MG tablet Commonly known as: MICRONOR Take 1 tablet by mouth daily.   ondansetron 4 MG tablet Commonly known as: ZOFRAN Take 4 mg by mouth every 8 (eight) hours. What changed: Another medication with the same name was added. Make sure you understand how and when to take each.    ondansetron 8 MG tablet Commonly known as: Zofran Take 1 tablet (8 mg total) by mouth 2 (two) times daily as needed for refractory nausea / vomiting. Start on day 3 after Cytoxan. What changed: You were already taking a medication with the same name, and this prescription was added. Make sure you understand how and when to take each.   Oxycodone HCl 10 MG Tabs Take 1 tablet (10 mg total) by mouth every 6 (six) hours as needed for up to 3 days for breakthrough pain.   polyethylene glycol 17 g packet Commonly known as: MIRALAX / GLYCOLAX Take 17 g by mouth daily. Start taking on: April 10, 2020   prochlorperazine 10 MG tablet Commonly known as: COMPAZINE Take 1 tablet (10 mg total) by mouth every 6 (six) hours as needed (Nausea or vomiting).   Vitamin D3 25 MCG tablet Commonly known as: Vitamin D Take 2 tablets (2,000 Units total) by mouth daily. Start taking on: April 10, 2020            Durable Medical Equipment  (From admission, onward)         Start     Ordered   04/09/20 0700  For home use only DME Walker rolling  Once       Question Answer Comment  Walker: With 5 Inch Wheels   Patient needs a walker to treat with the following condition Ambulatory dysfunction      04/09/20 0550         Allergies  Allergen Reactions  . Azithromycin Rash    Follow-up Information    Celene Squibb, MD. Call in 1 day(s).   Specialty: Internal Medicine Why: Please call for a post hospital follow-up appointment. Contact information: [place] [facility] [address] [phone]        Brunetta Genera, MD. Call in 1 day(s).   Specialties: Hematology, Oncology Why: Please call for a post hospital follow-up appointment. Contact information: [place] [address] [phone]        Janice Coffin, MD. Call in 1 day(s).   Specialty: Orthopedic Surgery Why: You are scheduled for appointment on 05/06/20 at 3:20 pm.   Go to the  [place] on the 4th Floor.  Parking Lot is at C purple level. Contact information: [place] [address] [phone]        Corrie Mckusick, DO. Call in 1 day(s).   Specialties: Interventional Radiology, Radiology Why: Please call for a post hospital follow up appointment. Contact information: [place] [address] [phone]  The results of significant diagnostics from this hospitalization (including imaging, microbiology, ancillary and laboratory) are listed below for reference.    Significant Diagnostic Studies: DG Chest 2 View  Result Date: 04/02/2020 CLINICAL DATA:  45 year old female with tachycardia and low grade fever. EXAM: CHEST - 2 VIEW COMPARISON:  03/10/2018 FINDINGS: The heart size and mediastinal contours are within normal limits. Both lungs are clear. The visualized skeletal structures are unremarkable. IMPRESSION: No acute cardiopulmonary process. Electronically Signed   By: Ruthann Cancer MD   On: 04/02/2020 09:09   CT L-SPINE NO CHARGE  Result Date: 04/02/2020 CLINICAL DATA:  Back pain.  Pathologic fracture with hypercalcemia. EXAM: CT LUMBAR SPINE WITHOUT CONTRAST TECHNIQUE: Multidetector CT imaging of the lumbar spine was performed without intravenous contrast administration. Multiplanar CT image reconstructions were also generated. COMPARISON:  Thoracolumbar spine radiographs 03/07/2020 FINDINGS: Segmentation: 5 lumbar type vertebrae. Alignment: Straightening of the normal lumbar lordosis. No significant listhesis. Vertebrae: Widespread lytic bone lesions throughout the lumbar spine as well as included lower thoracic spine and pelvis. Pathologic T12 compression fracture with 50% vertebral body height loss, progressed from the prior radiographs. New pathologic L4 fracture involving the vertebral body and pedicles with 85% vertebral body height loss  centrally and 6 mm retropulsion of the posterior vertebral cortex. Large lesion involving the left pedicle, pars, and transverse process of L1 with possible nondisplaced pathologic fracture. Paraspinal and other soft tissues: No acute paraspinal soft tissue abnormality. Intra-abdominal and pelvic contents reported separately. 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 IMPRESSION: 1. Widespread lytic bone lesions consistent with metastatic disease. 2. Pathologic T12 and L4 vertebral fractures as above. Possible nondisplaced pathologic fracture of the left L1 posterior elements. 3. 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 Electronically Signed   By: Logan Bores M.D.   On: 04/02/2020 12:52   DG CHEST PORT 1 VIEW  Result Date: 04/04/2020 CLINICAL DATA:  Multiple myeloma EXAM: PORTABLE CHEST 1 VIEW COMPARISON:  Portable exam 1812 hours compared to 04/02/2020 FINDINGS: Stable heart size and pulmonary vascularity. Prominent soft tissue at RIGHT mediastinal border just above the RIGHT hilum, increased in prominence since the previous exam, corresponding to paraspinal mass on prior CT study. Scattered interstitial infiltrates are seen in both lungs which could represent atypical infection or edema. No pleural effusion or pneumothorax. Bones demineralized. Bone destruction of the posterior RIGHT third rib identified. IMPRESSION: Scattered interstitial infiltrates, question edema versus atypical infection. Electronically Signed   By: Lavonia Dana M.D.   On: 04/04/2020 18:35   DG Bone Survey Met  Result Date: 04/02/2020 CLINICAL DATA:  Lytic lesion on x-ray.  Possible multiple myeloma. EXAM: METASTATIC BONE SURVEY COMPARISON:  CT of same day. FINDINGS: Ill-defined lucencies are noted throughout the skull which may represent lytic lesions. Lucencies are noted in the bilateral humeri. Lytic lesions are seen involving the right third and fourth ribs.  Probable lytic lesion is seen involving the left fourth rib. Lytic lesions are noted in the left scapula. Fractures are seen involving the T12 and L4 vertebral bodies consistent with pathologic fractures. Lucencies are seen throughout the pelvis and visualized proximal femurs consistent with lytic lesions. IMPRESSION: Multiple lytic lesions are noted consistent with multiple myeloma or metastatic disease. Pathologic fractures are seen involving the T12 and L4 vertebral bodies. Electronically Signed   By: Marijo Conception M.D.   On: 04/02/2020 15:29   CT BONE MARROW BIOPSY & ASPIRATION  Result Date:  04/03/2020 INDICATION: 45 year old female with newly diagnosed multiple myeloma. She presents for CT-guided bone marrow biopsy to assess for marrow involvement. EXAM: CT GUIDED BONE MARROW ASPIRATION AND CORE BIOPSY Interventional Radiologist:  Criselda Peaches, MD MEDICATIONS: None. ANESTHESIA/SEDATION: Moderate (conscious) sedation was employed during this procedure. A total of 4 milligrams versed and 150 micrograms fentanyl were administered intravenously. The patient's level of consciousness and vital signs were monitored continuously by radiology nursing throughout the procedure under my direct supervision. Total monitored sedation time: 18 minutes FLUOROSCOPY TIME:  None COMPLICATIONS: None immediate. Estimated blood loss: <25 mL PROCEDURE: Informed written consent was obtained from the patient after a thorough discussion of the procedural risks, benefits and alternatives. All questions were addressed. Maximal Sterile Barrier Technique was utilized including caps, mask, sterile gowns, sterile gloves, sterile drape, hand hygiene and skin antiseptic. A timeout was performed prior to the initiation of the procedure. The patient was positioned prone and non-contrast localization CT was performed of the pelvis to demonstrate the iliac marrow spaces. Maximal barrier sterile technique utilized including caps, mask,  sterile gowns, sterile gloves, large sterile drape, hand hygiene, and betadine prep. Under sterile conditions and local anesthesia, an 11 gauge coaxial bone biopsy needle was advanced into the right iliac marrow space. Needle position was confirmed with CT imaging. Initially, bone marrow aspiration was performed. Next, the 11 gauge outer cannula was utilized to obtain a right iliac bone marrow core biopsy. Needle was removed. Hemostasis was obtained with compression. The patient tolerated the procedure well. Samples were prepared with the cytotechnologist. IMPRESSION: Technically successful CT-guided bone marrow aspiration and core biopsy of the right iliac bone. Electronically Signed   By: Jacqulynn Cadet M.D.   On: 04/03/2020 19:33   ECHOCARDIOGRAM COMPLETE  Result Date: 04/06/2020    ECHOCARDIOGRAM REPORT   Patient Name:   ADDISON FREIMUTH [place] Date of Exam: 04/06/2020 Medical Rec #:  [id number]         Height:       64.0 in Accession #:    [id number]        Weight:       235.7 lb Date of Birth:  [birth year]         BSA:          2.098 m Patient Age:    21 years          BP:           188/99 mmHg Patient Gender: F                 HR:           99 bpm. Exam Location:  Inpatient Procedure: 2D Echo, Cardiac Doppler and Color Doppler Indications:    Elevated Troponin  History:        Patient has no prior history of Echocardiogram examinations.  Sonographer:    Bernadene Person RDCS Referring Phys: [id number] [place]  1. Left ventricular ejection fraction, by estimation, is 60 to 65%. The left ventricle has normal function. The left ventricle has no regional wall motion abnormalities. Left ventricular diastolic parameters are consistent with Grade I diastolic dysfunction (impaired relaxation). Elevated left ventricular end-diastolic pressure.  2. Right ventricular systolic function is normal. The right ventricular size is normal. There is mildly elevated pulmonary artery systolic pressure. The estimated  right ventricular systolic pressure is 53.6 mmHg.  3. The mitral valve is normal in structure. Mild mitral valve regurgitation. No evidence of mitral stenosis.  4. The  aortic valve is normal in structure. Aortic valve regurgitation is not visualized. No aortic stenosis is present.  5. The inferior vena cava is normal in size with greater than 50% respiratory variability, suggesting right atrial pressure of 3 mmHg. FINDINGS  Left Ventricle: Left ventricular ejection fraction, by estimation, is 60 to 65%. The left ventricle has normal function. The left ventricle has no regional wall motion abnormalities. The left ventricular internal cavity size was normal in size. There is  no left ventricular hypertrophy. Left ventricular diastolic parameters are consistent with Grade I diastolic dysfunction (impaired relaxation). Elevated left ventricular end-diastolic pressure. Right Ventricle: The right ventricular size is normal. No increase in right ventricular wall thickness. Right ventricular systolic function is normal. There is mildly elevated pulmonary artery systolic pressure. The tricuspid regurgitant velocity is 2.88  m/s, and with an assumed right atrial pressure of 3 mmHg, the estimated right ventricular systolic pressure is 56.3 mmHg. Left Atrium: Left atrial size was normal in size. Right Atrium: Right atrial size was normal in size. Pericardium: There is no evidence of pericardial effusion. Mitral Valve: The mitral valve is normal in structure. There is mild thickening of the mitral valve leaflet(s). Mild mitral valve regurgitation. No evidence of mitral valve stenosis. Tricuspid Valve: The tricuspid valve is normal in structure. Tricuspid valve regurgitation is mild . No evidence of tricuspid stenosis. Aortic Valve: The aortic valve is normal in structure. Aortic valve regurgitation is not visualized. No aortic stenosis is present. Pulmonic Valve: The pulmonic valve was normal in structure. Pulmonic valve  regurgitation is not visualized. No evidence of pulmonic stenosis. Aorta: The aortic root is normal in size and structure. Venous: The inferior vena cava is normal in size with greater than 50% respiratory variability, suggesting right atrial pressure of 3 mmHg. IAS/Shunts: No atrial level shunt detected by color flow Doppler.  LEFT VENTRICLE PLAX 2D LVIDd:         5.08 cm  Diastology LVIDs:         3.00 cm  LV e' medial:    0.06 cm/s LV PW:         0.96 cm  LV E/e' medial:  17.5 LV IVS:        0.87 cm  LV e' lateral:   0.08 cm/s LVOT diam:     2.20 cm  LV E/e' lateral: 13.0 LV SV:         81 LV SV Index:   38 LVOT Area:     3.80 cm  RIGHT VENTRICLE RV S prime:     9.11 cm/s TAPSE (M-mode): 2.0 cm LEFT ATRIUM         Index LA diam:    3.30 cm 1.57 cm/m  AORTIC VALVE LVOT Vmax:   124.00 cm/s LVOT Vmean:  85.100 cm/s LVOT VTI:    0.212 m  AORTA Ao Root diam: 3.30 cm Ao Asc diam:  3.20 cm MITRAL VALVE                TRICUSPID VALVE MV Area (PHT): 2.60 cm     TR Peak grad:   33.2 mmHg MV Decel Time: 292 msec     TR Vmax:        288.00 cm/s MV E velocity: 0.98 cm/s MV A velocity: 102.00 cm/s  SHUNTS MV E/A ratio:  0.01         Systemic VTI:  0.21 m  Systemic Diam: 2.20 cm Ena Dawley MD Electronically signed by Ena Dawley MD Signature Date/Time: 04/06/2020/1:18:07 PM    Final    DG FEMUR PORT MIN 2 VIEWS LEFT  Result Date: 04/02/2020 CLINICAL DATA:  Bilateral thigh pain.  History of myeloma. EXAM: LEFT FEMUR PORTABLE 2 VIEWS COMPARISON:  Femur exam on bone survey earlier today. Included portion from CT earlier today. FINDINGS: Lucent lesion in the lesser trochanter was better appreciated on CT earlier today. There are multiple small lucencies involving the femoral head and intertrochanteric femur that are not well seen by radiograph. No obvious destructive lesion in the femoral shaft. No evidence of pathologic fracture. IMPRESSION: Proximal femoral lesions including a lesion of  the lesser trochanter, majority of these are better seen on CT earlier today. There is no evidence of acute femur fracture or large destructive lesion. Consider MRI for more detailed assessment. Electronically Signed   By: Keith Rake M.D.   On: 04/02/2020 21:21   DG FEMUR PORT, MIN 2 VIEWS RIGHT  Result Date: 04/02/2020 CLINICAL DATA:  Right thigh pain.  Myeloma. EXAM: RIGHT FEMUR PORTABLE 2 VIEW COMPARISON:  Included portion from bone survey earlier today. Included portion from pelvis CT earlier today. FINDINGS: Lucent lesion in the intertrochanteric femur on CT earlier today is only faintly visualized, this is at risk for pathologic fracture. There is some thinning of the lateral cortex of the greater trochanter. There are multiple additional small lucencies in the femoral head on CT that are not well seen by radiograph. Small lesion noted in the distal lateral aspect of the femur in the region of the physeal scar. No evidence of acute fracture. IMPRESSION: Relatively large lucent lesion in the intertrochanteric femur on CT earlier today is only faintly visualized, this is at risk for pathologic fracture. There are multiple additional lucencies in the femoral head on CT are not well seen by radiograph. There is a small lesion in the distal lateral femoral metaphysis. MRI may be of value for more detailed assessment. Electronically Signed   By: Keith Rake M.D.   On: 04/02/2020 21:23   CT CHEST ABDOMEN PELVIS WO CONTRAST  Result Date: 04/02/2020 CLINICAL DATA:  Thoracic and lumbar spine pain. Pathologic fracture with hypercalcemia. EXAM: CT CHEST, ABDOMEN AND PELVIS WITHOUT CONTRAST TECHNIQUE: Multidetector CT imaging of the chest, abdomen and pelvis was performed following the standard protocol without IV contrast. COMPARISON:  Chest radiographs 04/02/2020. Thoracolumbar spine radiographs 03/07/2020. FINDINGS: CT CHEST FINDINGS Cardiovascular: Normal caliber of the thoracic aorta. Normal heart  size. No pericardial effusion. Mediastinum/Nodes: No enlarged axillary, mediastinal, or hilar lymph nodes are identified with hilar assessment limited in the absence of IV contrast. Unremarkable visualized thyroid and esophagus. Lungs/Pleura: No pleural effusion or pneumothorax. Patchy and hazy ground-glass opacities in the left greater than right upper lobes. No parenchymal lung mass. Musculoskeletal: Widespread lytic bone lesions throughout the visualized skeleton including a destructive 5 cm lesion involving the posterior right third rib and a 3 cm destructive lesion involving the posteromedial right sixth rib. Smaller destructive lesions with nondisplaced pathologic fractures of the posterolateral right fourth and posterior left fourth ribs. Large lesion involving much of the T1 vertebral body with disruption of the posterior vertebral body cortex but no gross epidural tumor. Pathologic vertebral body fractures with mild vertebral body height loss at T7 and T8 and moderate height loss at T12. CT ABDOMEN PELVIS FINDINGS Hepatobiliary: No focal liver abnormality is seen. No gallstones, gallbladder wall thickening, or biliary dilatation. Pancreas: Unremarkable.  Spleen: Unremarkable. Adrenals/Urinary Tract: Unremarkable adrenal glands. No evidence of renal mass, calculi, or hydronephrosis. Unremarkable bladder. Stomach/Bowel: The stomach is moderately distended by fluid and is otherwise unremarkable. There is no evidence of bowel obstruction or inflammation. The appendix is unremarkable. Vascular/Lymphatic: Normal caliber of the abdominal aorta. No enlarged lymph nodes. Reproductive: Grossly unremarkable uterus and ovaries. Other: No ascites. Musculoskeletal: Widespread lytic bone lesions throughout the spine and pelvis with the lumbar spine evaluated in detail on the separate dedicated spine CT. IMPRESSION: 1. Widespread lytic lesions throughout the visualized skeleton consistent with metastatic disease or multiple  myeloma. No definite primary malignancy identified in the chest, abdomen, or pelvis. 2. Nonspecific pulmonary ground-glass opacities in the left greater than right upper lobes, likely infectious or inflammatory. Electronically Signed   By: Logan Bores M.D.   On: 04/02/2020 13:12    Microbiology: Recent Results (from the past 240 hour(s))  Resp Panel by RT-PCR (Flu A&B, Covid) Nasopharyngeal Swab     Status: None   Collection Time: 04/02/20 11:12 AM   Specimen: Nasopharyngeal Swab; Nasopharyngeal(NP) swabs in vial transport medium  Result Value Ref Range Status   SARS Coronavirus 2 by RT PCR NEGATIVE NEGATIVE Final    Comment: (NOTE) SARS-CoV-2 target nucleic acids are NOT DETECTED.  The SARS-CoV-2 RNA is generally detectable in upper respiratory specimens during the acute phase of infection. The lowest concentration of SARS-CoV-2 viral copies this assay can detect is 138 copies/mL. A negative result does not preclude SARS-Cov-2 infection and should not be used as the sole basis for treatment or other patient management decisions. A negative result may occur with  improper specimen collection/handling, submission of specimen other than nasopharyngeal swab, presence of viral mutation(s) within the areas targeted by this assay, and inadequate number of viral copies(<138 copies/mL). A negative result must be combined with clinical observations, patient history, and epidemiological information. The expected result is Negative.  Fact Sheet for Patients:  EntrepreneurPulse.com.au  Fact Sheet for Healthcare Providers:  IncredibleEmployment.be  This test is no t yet approved or cleared by the [place] FDA and  has been authorized for detection and/or diagnosis of SARS-CoV-2 by FDA under an Emergency Use Authorization (EUA). This EUA will remain  in effect (meaning this test can be used) for the duration of the COVID-19 declaration under Section  564(b)(1) of the Act, 21 U.S.C.section 360bbb-3(b)(1), unless the authorization is terminated  or revoked sooner.       Influenza A by PCR NEGATIVE NEGATIVE Final   Influenza B by PCR NEGATIVE NEGATIVE Final    Comment: (NOTE) The Xpert Xpress SARS-CoV-2/FLU/RSV plus assay is intended as an aid in the diagnosis of influenza from Nasopharyngeal swab specimens and should not be used as a sole basis for treatment. Nasal washings and aspirates are unacceptable for Xpert Xpress SARS-CoV-2/FLU/RSV testing.  Fact Sheet for Patients: EntrepreneurPulse.com.au  Fact Sheet for Healthcare Providers: IncredibleEmployment.be  This test is not yet approved or cleared by the [place] FDA and has been authorized for detection and/or diagnosis of SARS-CoV-2 by FDA under an Emergency Use Authorization (EUA). This EUA will remain in effect (meaning this test can be used) for the duration of the COVID-19 declaration under Section 564(b)(1) of the Act, 21 U.S.C. section 360bbb-3(b)(1), unless the authorization is terminated or revoked.  Performed at [facility], [place] [address] [place], [place] [address]   MRSA PCR Screening     Status: None   Collection Time: 04/02/20 11:01 PM   Specimen:  Nasal Mucosa; Nasopharyngeal  Result Value Ref Range Status   MRSA by PCR NEGATIVE NEGATIVE Final    Comment:        The GeneXpert MRSA Assay (FDA approved for NASAL specimens only), is one component of a comprehensive MRSA colonization surveillance program. It is not intended to diagnose MRSA infection nor to guide or monitor treatment for MRSA infections. Performed at [facility], [place] [address] [place], [place] [address]      Labs: Basic Metabolic Panel: Recent Labs  Lab 04/05/20 0503 04/06/20 0607 04/07/20 0606 04/08/20 0531 04/09/20 0539  NA 137 133* 132* 133* 131*  K 3.5 3.8 3.4* 3.6 3.9  CL 109 105 104 106 104  CO2 22 22 22  19*  19*  GLUCOSE 99 89 94 93 145*  BUN 50* 51* 46* 41* 37*  CREATININE 1.58* 1.35* 1.22* 1.11* 0.98  CALCIUM 10.1 9.9 9.3 9.3 8.5*  MG 1.7  --   --  2.0  --   PHOS 2.6  --   --  2.2*  --    Liver Function Tests: Recent Labs  Lab 04/03/20 0432 04/04/20 0958 04/05/20 0503 04/06/20 0607 04/07/20 0606  AST 16 21 17  11* 13*  ALT 26 28 26 26 31   ALKPHOS 219* 222* 240* 266* 327*  BILITOT 0.4 0.6 0.5 0.6 0.7  PROT 9.9* 9.2* 9.1* 8.8* 8.7*  ALBUMIN 2.2* 2.3* 2.6* 2.5* 2.6*   No results for input(s): LIPASE, AMYLASE in the last 168 hours. No results for input(s): AMMONIA in the last 168 hours. CBC: Recent Labs  Lab 04/04/20 0958 04/05/20 0503 04/06/20 0607 04/07/20 0606 04/08/20 0533 04/09/20 0539  WBC 15.9* 14.8* 9.7 6.3 6.6 6.4  NEUTROABS 14.5*  --   --   --  4.9  --   HGB 7.4* 8.9* 9.0* 9.5* 9.5* 9.0*  HCT 23.7* 27.4* 28.1* 29.1* 29.9* 27.3*  MCV 101.3* 97.2 97.6 96.7 100.3* 97.2  PLT 290 344 253 254 216 255   Cardiac Enzymes: No results for input(s): CKTOTAL, CKMB, CKMBINDEX, TROPONINI in the last 168 hours. BNP: BNP (last 3 results) No results for input(s): BNP in the last 8760 hours.  ProBNP (last 3 results) No results for input(s): PROBNP in the last 8760 hours.  CBG: No results for input(s): GLUCAP in the last 168 hours.     Signed:  Kayleen Memos, MD [facility] 04/09/2020, 2:14 PM

## 2020-04-10 ENCOUNTER — Ambulatory Visit
Admission: RE | Admit: 2020-04-10 | Discharge: 2020-04-10 | Disposition: A | Discharge status: Home / Self Care | Payer: BC Managed Care – PPO | Source: Ambulatory Visit | Attending: Radiation Oncology | Admitting: Radiation Oncology

## 2020-04-10 DIAGNOSIS — Z510 Encounter for antineoplastic radiation therapy: Secondary | ICD-10-CM | POA: Diagnosis not present

## 2020-04-10 DIAGNOSIS — G893 Neoplasm related pain (acute) (chronic): Secondary | ICD-10-CM | POA: Diagnosis not present

## 2020-04-10 DIAGNOSIS — C7931 Secondary malignant neoplasm of brain: Secondary | ICD-10-CM | POA: Diagnosis not present

## 2020-04-10 DIAGNOSIS — C9000 Multiple myeloma not having achieved remission: Secondary | ICD-10-CM | POA: Diagnosis not present

## 2020-04-11 ENCOUNTER — Other Ambulatory Visit: Payer: Self-pay | Admitting: Oncology

## 2020-04-11 ENCOUNTER — Encounter (HOSPITAL_COMMUNITY): Payer: Self-pay | Admitting: Hematology

## 2020-04-11 ENCOUNTER — Ambulatory Visit
Admission: RE | Admit: 2020-04-11 | Discharge: 2020-04-11 | Disposition: A | Discharge status: Home / Self Care | Payer: BC Managed Care – PPO | Source: Ambulatory Visit | Attending: Radiation Oncology | Admitting: Radiation Oncology

## 2020-04-11 DIAGNOSIS — C9000 Multiple myeloma not having achieved remission: Secondary | ICD-10-CM

## 2020-04-11 DIAGNOSIS — Z510 Encounter for antineoplastic radiation therapy: Secondary | ICD-10-CM | POA: Diagnosis not present

## 2020-04-12 ENCOUNTER — Encounter (HOSPITAL_COMMUNITY): Payer: Self-pay | Admitting: Hematology

## 2020-04-12 ENCOUNTER — Ambulatory Visit
Admission: RE | Admit: 2020-04-12 | Discharge: 2020-04-12 | Disposition: A | Discharge status: Home / Self Care | Payer: BC Managed Care – PPO | Source: Ambulatory Visit | Attending: Radiation Oncology | Admitting: Radiation Oncology

## 2020-04-12 ENCOUNTER — Inpatient Hospital Stay: Payer: BC Managed Care – PPO | Attending: Hematology

## 2020-04-12 VITALS — BP 142/77 | HR 95 | Temp 98.0°F | Resp 16

## 2020-04-12 DIAGNOSIS — Z5112 Encounter for antineoplastic immunotherapy: Secondary | ICD-10-CM | POA: Diagnosis present

## 2020-04-12 DIAGNOSIS — Z5111 Encounter for antineoplastic chemotherapy: Secondary | ICD-10-CM | POA: Insufficient documentation

## 2020-04-12 DIAGNOSIS — Z79899 Other long term (current) drug therapy: Secondary | ICD-10-CM | POA: Insufficient documentation

## 2020-04-12 DIAGNOSIS — C9000 Multiple myeloma not having achieved remission: Secondary | ICD-10-CM | POA: Insufficient documentation

## 2020-04-12 DIAGNOSIS — Z7189 Other specified counseling: Secondary | ICD-10-CM

## 2020-04-12 DIAGNOSIS — Z510 Encounter for antineoplastic radiation therapy: Secondary | ICD-10-CM | POA: Diagnosis not present

## 2020-04-12 MED ORDER — PROCHLORPERAZINE MALEATE 10 MG PO TABS
ORAL_TABLET | ORAL | Status: AC
  Filled 2020-04-12: qty 1

## 2020-04-12 MED ORDER — PROCHLORPERAZINE MALEATE 10 MG PO TABS
10.0000 mg | ORAL_TABLET | Freq: Once | ORAL | Status: AC
  Administered 2020-04-12: 10 mg via ORAL

## 2020-04-12 MED ORDER — BORTEZOMIB CHEMO SQ INJECTION 3.5 MG (2.5MG/ML)
1.3000 mg/m2 | Freq: Once | INTRAMUSCULAR | Status: AC
  Administered 2020-04-12: 2.75 mg via SUBCUTANEOUS
  Filled 2020-04-12: qty 1.1

## 2020-04-12 NOTE — Patient Instructions (Addendum)
[facility] Discharge Instructions for Patients Receiving Chemotherapy  Today you received the following chemotherapy agents: bortezomib.  To help prevent nausea and vomiting after your treatment, we encourage you to take your nausea medication as directed.   If you develop nausea and vomiting that is not controlled by your nausea medication, call the clinic.   BELOW ARE SYMPTOMS THAT SHOULD BE REPORTED IMMEDIATELY:  *FEVER GREATER THAN 100.5 F  *CHILLS WITH OR WITHOUT FEVER  NAUSEA AND VOMITING THAT IS NOT CONTROLLED WITH YOUR NAUSEA MEDICATION  *UNUSUAL SHORTNESS OF BREATH  *UNUSUAL BRUISING OR BLEEDING  TENDERNESS IN MOUTH AND THROAT WITH OR WITHOUT PRESENCE OF ULCERS  *URINARY PROBLEMS  *BOWEL PROBLEMS  UNUSUAL RASH Items with * indicate a potential emergency and should be followed up as soon as possible.  Feel free to call the clinic should you have any questions or concerns. The clinic phone number is [phone].  Please show the [facility] at check-in to the Emergency Department and triage nurse.  Bortezomib injection What is this medicine? BORTEZOMIB (bor TEZ oh mib) targets proteins in cancer cells and stops the cancer cells from growing. It treats multiple myeloma and mantle cell lymphoma. This medicine may be used for other purposes; ask your health care provider or pharmacist if you have questions. COMMON BRAND NAME(S): Velcade What should I tell my health care provider before I take this medicine? They need to know if you have any of these conditions:  dehydration  diabetes (high blood sugar)  heart disease  liver disease  tingling of the fingers or toes or other nerve disorder  an unusual or allergic reaction to bortezomib, mannitol, boron, other medicines, foods, dyes, or preservatives  pregnant or trying to get pregnant  breast-feeding How should I use this medicine? This medicine is injected into a vein or under the  skin. It is given by a health care provider in a hospital or clinic setting. Talk to your health care provider about the use of this medicine in children. Special care may be needed. Overdosage: If you think you have taken too much of this medicine contact a poison control center or emergency room at once. NOTE: This medicine is only for you. Do not share this medicine with others. What if I miss a dose? Keep appointments for follow-up doses. It is important not to miss your dose. Call your health care provider if you are unable to keep an appointment. What may interact with this medicine? This medicine may interact with the following medications:  ketoconazole  rifampin This list may not describe all possible interactions. Give your health care provider a list of all the medicines, herbs, non-prescription drugs, or dietary supplements you use. Also tell them if you smoke, drink alcohol, or use illegal drugs. Some items may interact with your medicine. What should I watch for while using this medicine? Your condition will be monitored carefully while you are receiving this medicine. You may need blood work done while you are taking this medicine. You may get drowsy or dizzy. Do not drive, use machinery, or do anything that needs mental alertness until you know how this medicine affects you. Do not stand up or sit up quickly, especially if you are an older patient. This reduces the risk of dizzy or fainting spells This medicine may increase your risk of getting an infection. Call your health care provider for advice if you get a fever, chills, sore throat, or other symptoms of a  cold or flu. Do not treat yourself. Try to avoid being around people who are sick. Check with your health care provider if you have severe diarrhea, nausea, and vomiting, or if you sweat a lot. The loss of too much body fluid may make it dangerous for you to take this medicine. Do not become pregnant while taking this  medicine or for 7 months after stopping it. Women should inform their health care provider if they wish to become pregnant or think they might be pregnant. Men should not father a child while taking this medicine and for 4 months after stopping it. There is a potential for serious harm to an unborn child. Talk to your health care provider for more information. Do not breast-feed an infant while taking this medicine or for 2 months after stopping it. This medicine may make it more difficult to get pregnant or father a child. Talk to your health care provider if you are concerned about your fertility. What side effects may I notice from receiving this medicine? Side effects that you should report to your doctor or health care professional as soon as possible:  allergic reactions (skin rash; itching or hives; swelling of the face, lips, or tongue)  bleeding (bloody or black, tarry stools; red or dark brown urine; spitting up blood or brown material that looks like coffee grounds; red spots on the skin; unusual bruising or bleeding from the eye, gums, or nose)  blurred vision or changes in vision  confusion  constipation  headache  heart failure (trouble breathing; fast, irregular heartbeat; sudden weight gain; swelling of the ankles, feet, hands)  infection (fever, chills, cough, sore throat, pain or trouble passing urine)  lack or loss of appetite  liver injury (dark yellow or brown urine; general ill feeling or flu-like symptoms; loss of appetite, right upper belly pain; yellowing of the eyes or skin)  low blood pressure (dizziness; feeling faint or lightheaded, falls; unusually weak or tired)  muscle cramps  pain, redness, or irritation at site where injected  pain, tingling, numbness in the hands or feet  seizures  trouble breathing  unusual bruising or bleeding Side effects that usually do not require medical attention (report to your doctor or health care professional if they  continue or are bothersome):  diarrhea  nausea  stomach pain  trouble sleeping  vomiting This list may not describe all possible side effects. Call your doctor for medical advice about side effects. You may report side effects to FDA at 1-800-FDA-1088. Where should I keep my medicine? This medicine is given in a hospital or clinic. It will not be stored at home. NOTE: This sheet is a summary. It may not cover all possible information. If you have questions about this medicine, talk to your doctor, pharmacist, or health care provider.  2021 Elsevier/Gold Standard (2020-01-25 13:22:53)

## 2020-04-14 NOTE — Progress Notes (Signed)
HEMATOLOGY/ONCOLOGY CONSULTATION NOTE  Date of Service: .04/15/2020   Patient Care Team: Celene Squibb, MD as PCP - General (Internal Medicine) Corrie Mckusick, DO as Consulting Physician (Interventional Radiology)  CHIEF COMPLAINTS/PURPOSE OF CONSULTATION:  Multiple Myeloma  HISTORY OF PRESENTING ILLNESS:   Stacey Montoya is a wonderful 45 y.o. female who is here today for evaluation and management of newly diagnosed Myeloma. The patient's last visit with [place] was inpatient on 04/02/2020. The pt reports that she is doing well overall. We are joined today by her husband.  The pt reports that she has been enjoying being back home, but notes more pain in her leg due to increased activities. She note some soreness in her throat. The pt notes her back pain has improved, and she is very hungry thus eating and drinking well. The pt notes minor abdominal pain. The pt notes this pain in her left leg is combination of both pain and weakness. The pt notes no issues getting into and out of the car for travels. She feels a deep burn down the side of her thigh that is somewhat positional. The pt notes more pain going up stairs than down stairs.  Lab results today 04/15/2020 of CBC w/diff and CMP is as follows: all values are WNL except for WBC of 3.6, RBC of 2.84, Hgb of 9.0, HCT of 28.3, RDW of 16.1, Plt of 142K, Lymphs Abs of 0.2K, Sodium of 134, Glucose of 132, Calcium of 8.1, Albumin of 2.6, AST of 10, Alkaline Phosphatase of 325. 04/15/2020 MMP in progress. 04/15/2020 K/L Light Chains in progress.  On review of systems, pt reports leg pain, leg weakness and denies changes in bowel habits, difficulty urinating, decreased appetite, back pain, abdominal pain, leg swelling, thigh pain and any other symptoms.   MEDICAL HISTORY:  Past Medical History:  Diagnosis Date   Back pain     SURGICAL HISTORY: No past surgical history on file.  SOCIAL HISTORY: Social History   Socioeconomic History    Marital status: Married    Spouse name: Not on file   Number of children: Not on file   Years of education: Not on file   Highest education level: Not on file  Occupational History   Not on file  Tobacco Use   Smoking status: Current Every Day Smoker    Packs/day: 0.50    Years: 20.00    Pack years: 10.00    Types: Cigarettes   Smokeless tobacco: Never Used  Scientific laboratory technician Use: Every day  Substance and Sexual Activity   Alcohol use: Not Currently   Drug use: Not Currently   Sexual activity: Not on file  Other Topics Concern   Not on file  Social History Narrative   Not on file   Social Determinants of Health   Financial Resource Strain: Not on file  Food Insecurity: Not on file  Transportation Needs: Not on file  Physical Activity: Not on file  Stress: Not on file  Social Connections: Not on file  Intimate Partner Violence: Not on file    FAMILY HISTORY: Family History  Problem Relation Age of Onset   Lung cancer Maternal Grandfather    Pancreatic cancer Paternal Grandfather     ALLERGIES:  is allergic to azithromycin.  MEDICATIONS:  Current Outpatient Medications  Medication Sig Dispense Refill   acyclovir (ZOVIRAX) 400 MG tablet Take 1 tablet (400 mg total) by mouth 2 (two) times daily. 60 tablet 3  albuterol (VENTOLIN HFA) 108 (90 Base) MCG/ACT inhaler Inhale 1-2 puffs into the lungs as needed for wheezing.     amLODipine (NORVASC) 5 MG tablet Take 1 tablet (5 mg total) by mouth daily. 90 tablet 0   cholecalciferol (VITAMIN D) 25 MCG tablet Take 2 tablets (2,000 Units total) by mouth daily. 180 tablet 0   dexamethasone (DECADRON) 4 MG tablet Take 1 tablet (4 mg total) by mouth daily. 30 tablet 0   fentaNYL (DURAGESIC) 25 MCG/HR Place 1 patch onto the skin every 3 (three) days for 6 days. 2 patch 0   gabapentin (NEURONTIN) 300 MG capsule Take 1 capsule (300 mg total) by mouth 2 (two) times daily. 180 capsule 0   LORazepam  (ATIVAN) 0.5 MG tablet Take 1 tablet (0.5 mg total) by mouth every 6 (six) hours as needed (Nausea or vomiting). 30 tablet 0   metoprolol tartrate (LOPRESSOR) 50 MG tablet Take 1 tablet (50 mg total) by mouth 2 (two) times daily. 180 tablet 0   nicotine (NICODERM CQ - DOSED IN MG/24 HOURS) 21 mg/24hr patch Place 1 patch (21 mg total) onto the skin daily. 28 patch 0   norethindrone (MICRONOR) 0.35 MG tablet Take 1 tablet by mouth daily.     ondansetron (ZOFRAN) 4 MG tablet Take 4 mg by mouth every 8 (eight) hours.     ondansetron (ZOFRAN) 8 MG tablet Take 1 tablet (8 mg total) by mouth 2 (two) times daily as needed for refractory nausea / vomiting. Start on day 3 after Cytoxan. 30 tablet 1   polyethylene glycol (MIRALAX / GLYCOLAX) 17 g packet Take 17 g by mouth daily. 14 each 0   prochlorperazine (COMPAZINE) 10 MG tablet Take 1 tablet (10 mg total) by mouth every 6 (six) hours as needed (Nausea or vomiting). 30 tablet 1   No current facility-administered medications for this visit.    REVIEW OF SYSTEMS:   10 Point review of Systems was done is negative except as noted above.  PHYSICAL EXAMINATION: ECOG PERFORMANCE STATUS: 2 - Symptomatic, <50% confined to bed  . Vitals:   04/15/20 1225  BP: 122/77  Pulse: 87  Resp: 20  Temp: 98.7 F (37.1 C)  SpO2: 100%   Filed Weights   .Body mass index is 40.45 kg/m.  Exam was given in a wheelchair.   GENERAL:alert, in no acute distress and comfortable SKIN: no acute rashes, no significant lesions EYES: conjunctiva are pink and non-injected, sclera anicteric OROPHARYNX: MMM, no exudates, no oropharyngeal erythema or ulceration NECK: supple, no JVD LYMPH:  no palpable lymphadenopathy in the cervical, axillary or inguinal regions LUNGS: clear to auscultation b/l with normal respiratory effort HEART: regular rate & rhythm ABDOMEN:  normoactive bowel sounds , non tender, not distended. Extremity: no pedal edema PSYCH: alert &  oriented x 3 with fluent speech NEURO: no focal motor/sensory deficits  LABORATORY DATA:  I have reviewed the data as listed  . CBC Latest Ref Rng & Units 04/15/2020 04/09/2020 04/08/2020  WBC 4.0 - 10.5 K/uL 3.6(L) 6.4 6.6  Hemoglobin 12.0 - 15.0 g/dL 9.0(L) 9.0(L) 9.5(L)  Hematocrit 36.0 - 46.0 % 28.3(L) 27.3(L) 29.9(L)  Platelets 150 - 400 K/uL 142(L) 255 216    . CMP Latest Ref Rng & Units 04/15/2020 04/09/2020 04/08/2020  Glucose 70 - 99 mg/dL 132(H) 145(H) 93  BUN 6 - 20 mg/dL 18 37(H) 41(H)  Creatinine 0.44 - 1.00 mg/dL 0.80 0.98 1.11(H)  Sodium 135 - 145 mmol/L 134(L) 131(L) 133(L)  Potassium 3.5 - 5.1 mmol/L 3.9 3.9 3.6  Chloride 98 - 111 mmol/L 107 104 106  CO2 22 - 32 mmol/L 22 19(L) 19(L)  Calcium 8.9 - 10.3 mg/dL 8.1(L) 8.5(L) 9.3  Total Protein 6.5 - 8.1 g/dL 7.7 - -  Total Bilirubin 0.3 - 1.2 mg/dL 0.3 - -  Alkaline Phos 38 - 126 U/L 325(H) - -  AST 15 - 41 U/L 10(L) - -  ALT 0 - 44 U/L 27 - -     RADIOGRAPHIC STUDIES: I have personally reviewed the radiological images as listed and agreed with the findings in the report. DG Chest 2 View  Result Date: 04/02/2020 CLINICAL DATA:  45 year old female with tachycardia and low grade fever. EXAM: CHEST - 2 VIEW COMPARISON:  03/10/2018 FINDINGS: The heart size and mediastinal contours are within normal limits. Both lungs are clear. The visualized skeletal structures are unremarkable. IMPRESSION: No acute cardiopulmonary process. Electronically Signed   By: Ruthann Cancer MD   On: 04/02/2020 09:09   CT L-SPINE NO CHARGE  Result Date: 04/02/2020 CLINICAL DATA:  Back pain.  Pathologic fracture with hypercalcemia. EXAM: CT LUMBAR SPINE WITHOUT CONTRAST TECHNIQUE: Multidetector CT imaging of the lumbar spine was performed without intravenous contrast administration. Multiplanar CT image reconstructions were also generated. COMPARISON:  Thoracolumbar spine radiographs 03/07/2020 FINDINGS: Segmentation: 5 lumbar type vertebrae.  Alignment: Straightening of the normal lumbar lordosis. No significant listhesis. Vertebrae: Widespread lytic bone lesions throughout the lumbar spine as well as included lower thoracic spine and pelvis. Pathologic T12 compression fracture with 50% vertebral body height loss, progressed from the prior radiographs. New pathologic L4 fracture involving the vertebral body and pedicles with 85% vertebral body height loss centrally and 6 mm retropulsion of the posterior vertebral cortex. Large lesion involving the left pedicle, pars, and transverse process of L1 with possible nondisplaced pathologic fracture. Paraspinal and other soft tissues: No acute paraspinal soft tissue abnormality. Intra-abdominal and pelvic contents reported separately. 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 IMPRESSION: 1. Widespread lytic bone lesions consistent with metastatic disease. 2. Pathologic T12 and L4 vertebral fractures as above. Possible nondisplaced pathologic fracture of the left L1 posterior elements. 3. 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 Electronically Signed   By: Logan Bores M.D.   On: 04/02/2020 12:52   DG CHEST PORT 1 VIEW  Result Date: 04/04/2020 CLINICAL DATA:  Multiple myeloma EXAM: PORTABLE CHEST 1 VIEW COMPARISON:  Portable exam 1812 hours compared to 04/02/2020 FINDINGS: Stable heart size and pulmonary vascularity. Prominent soft tissue at RIGHT mediastinal border just above the RIGHT hilum, increased in prominence since the previous exam, corresponding to paraspinal mass on prior CT study. Scattered interstitial infiltrates are seen in both lungs which could represent atypical infection or edema. No pleural effusion or pneumothorax. Bones demineralized. Bone destruction of the posterior RIGHT third rib identified. IMPRESSION: Scattered interstitial infiltrates, question edema versus atypical infection. Electronically Signed   By: Lavonia Dana M.D.   On: 04/04/2020 18:35   DG Bone Survey Met  Result Date: 04/02/2020 CLINICAL DATA:  Lytic lesion on x-ray.  Possible multiple myeloma. EXAM: METASTATIC BONE SURVEY COMPARISON:  CT of same day. FINDINGS: Ill-defined lucencies are noted throughout the skull which may represent lytic lesions. Lucencies are noted in the bilateral humeri. Lytic lesions are seen involving the right third and fourth ribs. Probable lytic lesion is seen involving the left fourth rib. Lytic lesions are noted in  the left scapula. Fractures are seen involving the T12 and L4 vertebral bodies consistent with pathologic fractures. Lucencies are seen throughout the pelvis and visualized proximal femurs consistent with lytic lesions. IMPRESSION: Multiple lytic lesions are noted consistent with multiple myeloma or metastatic disease. Pathologic fractures are seen involving the T12 and L4 vertebral bodies. Electronically Signed   By: Marijo Conception M.D.   On: 04/02/2020 15:29   CT BONE MARROW BIOPSY & ASPIRATION  Result Date: 04/03/2020 INDICATION: 45 year old female with newly diagnosed multiple myeloma. She presents for CT-guided bone marrow biopsy to assess for marrow involvement. EXAM: CT GUIDED BONE MARROW ASPIRATION AND CORE BIOPSY Interventional Radiologist:  Criselda Peaches, MD MEDICATIONS: None. ANESTHESIA/SEDATION: Moderate (conscious) sedation was employed during this procedure. A total of 4 milligrams versed and 150 micrograms fentanyl were administered intravenously. The patient's level of consciousness and vital signs were monitored continuously by radiology nursing throughout the procedure under my direct supervision. Total monitored sedation time: 18 minutes FLUOROSCOPY TIME:  None COMPLICATIONS: None immediate. Estimated blood loss: <25 mL PROCEDURE: Informed written consent was obtained from the patient after a thorough discussion of the procedural risks, benefits and alternatives. All questions were  addressed. Maximal Sterile Barrier Technique was utilized including caps, mask, sterile gowns, sterile gloves, sterile drape, hand hygiene and skin antiseptic. A timeout was performed prior to the initiation of the procedure. The patient was positioned prone and non-contrast localization CT was performed of the pelvis to demonstrate the iliac marrow spaces. Maximal barrier sterile technique utilized including caps, mask, sterile gowns, sterile gloves, large sterile drape, hand hygiene, and betadine prep. Under sterile conditions and local anesthesia, an 11 gauge coaxial bone biopsy needle was advanced into the right iliac marrow space. Needle position was confirmed with CT imaging. Initially, bone marrow aspiration was performed. Next, the 11 gauge outer cannula was utilized to obtain a right iliac bone marrow core biopsy. Needle was removed. Hemostasis was obtained with compression. The patient tolerated the procedure well. Samples were prepared with the cytotechnologist. IMPRESSION: Technically successful CT-guided bone marrow aspiration and core biopsy of the right iliac bone. Electronically Signed   By: Jacqulynn Cadet M.D.   On: 04/03/2020 19:33   ECHOCARDIOGRAM COMPLETE  Result Date: 04/06/2020    ECHOCARDIOGRAM REPORT   Patient Name:   Stacey Montoya Date of Exam: 04/06/2020 Medical Rec #:  [id number]         Height:       64.0 in Accession #:    [id number]        Weight:       235.7 lb Date of Birth:  [birth year]         BSA:          2.098 m Patient Age:    26 years          BP:           188/99 mmHg Patient Gender: F                 HR:           99 bpm. Exam Location:  Inpatient Procedure: 2D Echo, Cardiac Doppler and Color Doppler Indications:    Elevated Troponin  History:        Patient has no prior history of Echocardiogram examinations.  Sonographer:    Bernadene Person RDCS Referring Phys: [id number] [place]  1. Left ventricular ejection fraction, by estimation, is 60 to 65%. The  left ventricle has  normal function. The left ventricle has no regional wall motion abnormalities. Left ventricular diastolic parameters are consistent with Grade I diastolic dysfunction (impaired relaxation). Elevated left ventricular end-diastolic pressure.  2. Right ventricular systolic function is normal. The right ventricular size is normal. There is mildly elevated pulmonary artery systolic pressure. The estimated right ventricular systolic pressure is 49.4 mmHg.  3. The mitral valve is normal in structure. Mild mitral valve regurgitation. No evidence of mitral stenosis.  4. The aortic valve is normal in structure. Aortic valve regurgitation is not visualized. No aortic stenosis is present.  5. The inferior vena cava is normal in size with greater than 50% respiratory variability, suggesting right atrial pressure of 3 mmHg. FINDINGS  Left Ventricle: Left ventricular ejection fraction, by estimation, is 60 to 65%. The left ventricle has normal function. The left ventricle has no regional wall motion abnormalities. The left ventricular internal cavity size was normal in size. There is  no left ventricular hypertrophy. Left ventricular diastolic parameters are consistent with Grade I diastolic dysfunction (impaired relaxation). Elevated left ventricular end-diastolic pressure. Right Ventricle: The right ventricular size is normal. No increase in right ventricular wall thickness. Right ventricular systolic function is normal. There is mildly elevated pulmonary artery systolic pressure. The tricuspid regurgitant velocity is 2.88  m/s, and with an assumed right atrial pressure of 3 mmHg, the estimated right ventricular systolic pressure is 49.6 mmHg. Left Atrium: Left atrial size was normal in size. Right Atrium: Right atrial size was normal in size. Pericardium: There is no evidence of pericardial effusion. Mitral Valve: The mitral valve is normal in structure. There is mild thickening of the mitral valve  leaflet(s). Mild mitral valve regurgitation. No evidence of mitral valve stenosis. Tricuspid Valve: The tricuspid valve is normal in structure. Tricuspid valve regurgitation is mild . No evidence of tricuspid stenosis. Aortic Valve: The aortic valve is normal in structure. Aortic valve regurgitation is not visualized. No aortic stenosis is present. Pulmonic Valve: The pulmonic valve was normal in structure. Pulmonic valve regurgitation is not visualized. No evidence of pulmonic stenosis. Aorta: The aortic root is normal in size and structure. Venous: The inferior vena cava is normal in size with greater than 50% respiratory variability, suggesting right atrial pressure of 3 mmHg. IAS/Shunts: No atrial level shunt detected by color flow Doppler.  LEFT VENTRICLE PLAX 2D LVIDd:         5.08 cm  Diastology LVIDs:         3.00 cm  LV e' medial:    0.06 cm/s LV PW:         0.96 cm  LV E/e' medial:  17.5 LV IVS:        0.87 cm  LV e' lateral:   0.08 cm/s LVOT diam:     2.20 cm  LV E/e' lateral: 13.0 LV SV:         81 LV SV Index:   38 LVOT Area:     3.80 cm  RIGHT VENTRICLE RV S prime:     9.11 cm/s TAPSE (M-mode): 2.0 cm LEFT ATRIUM         Index LA diam:    3.30 cm 1.57 cm/m  AORTIC VALVE LVOT Vmax:   124.00 cm/s LVOT Vmean:  85.100 cm/s LVOT VTI:    0.212 m  AORTA Ao Root diam: 3.30 cm Ao Asc diam:  3.20 cm MITRAL VALVE                TRICUSPID VALVE  MV Area (PHT): 2.60 cm     TR Peak grad:   33.2 mmHg MV Decel Time: 292 msec     TR Vmax:        288.00 cm/s MV E velocity: 0.98 cm/s MV A velocity: 102.00 cm/s  SHUNTS MV E/A ratio:  0.01         Systemic VTI:  0.21 m                             Systemic Diam: 2.20 cm Ena Dawley MD Electronically signed by Ena Dawley MD Signature Date/Time: 04/06/2020/1:18:07 PM    Final    DG FEMUR PORT MIN 2 VIEWS LEFT  Result Date: 04/02/2020 CLINICAL DATA:  Bilateral thigh pain.  History of myeloma. EXAM: LEFT FEMUR PORTABLE 2 VIEWS COMPARISON:  Femur exam on bone  survey earlier today. Included portion from CT earlier today. FINDINGS: Lucent lesion in the lesser trochanter was better appreciated on CT earlier today. There are multiple small lucencies involving the femoral head and intertrochanteric femur that are not well seen by radiograph. No obvious destructive lesion in the femoral shaft. No evidence of pathologic fracture. IMPRESSION: Proximal femoral lesions including a lesion of the lesser trochanter, majority of these are better seen on CT earlier today. There is no evidence of acute femur fracture or large destructive lesion. Consider MRI for more detailed assessment. Electronically Signed   By: Keith Rake M.D.   On: 04/02/2020 21:21   DG FEMUR PORT, MIN 2 VIEWS RIGHT  Result Date: 04/02/2020 CLINICAL DATA:  Right thigh pain.  Myeloma. EXAM: RIGHT FEMUR PORTABLE 2 VIEW COMPARISON:  Included portion from bone survey earlier today. Included portion from pelvis CT earlier today. FINDINGS: Lucent lesion in the intertrochanteric femur on CT earlier today is only faintly visualized, this is at risk for pathologic fracture. There is some thinning of the lateral cortex of the greater trochanter. There are multiple additional small lucencies in the femoral head on CT that are not well seen by radiograph. Small lesion noted in the distal lateral aspect of the femur in the region of the physeal scar. No evidence of acute fracture. IMPRESSION: Relatively large lucent lesion in the intertrochanteric femur on CT earlier today is only faintly visualized, this is at risk for pathologic fracture. There are multiple additional lucencies in the femoral head on CT are not well seen by radiograph. There is a small lesion in the distal lateral femoral metaphysis. MRI may be of value for more detailed assessment. Electronically Signed   By: Keith Rake M.D.   On: 04/02/2020 21:23   CT CHEST ABDOMEN PELVIS WO CONTRAST  Result Date: 04/02/2020 CLINICAL DATA:  Thoracic and  lumbar spine pain. Pathologic fracture with hypercalcemia. EXAM: CT CHEST, ABDOMEN AND PELVIS WITHOUT CONTRAST TECHNIQUE: Multidetector CT imaging of the chest, abdomen and pelvis was performed following the standard protocol without IV contrast. COMPARISON:  Chest radiographs 04/02/2020. Thoracolumbar spine radiographs 03/07/2020. FINDINGS: CT CHEST FINDINGS Cardiovascular: Normal caliber of the thoracic aorta. Normal heart size. No pericardial effusion. Mediastinum/Nodes: No enlarged axillary, mediastinal, or hilar lymph nodes are identified with hilar assessment limited in the absence of IV contrast. Unremarkable visualized thyroid and esophagus. Lungs/Pleura: No pleural effusion or pneumothorax. Patchy and hazy ground-glass opacities in the left greater than right upper lobes. No parenchymal lung mass. Musculoskeletal: Widespread lytic bone lesions throughout the visualized skeleton including a destructive 5 cm lesion involving the posterior right  third rib and a 3 cm destructive lesion involving the posteromedial right sixth rib. Smaller destructive lesions with nondisplaced pathologic fractures of the posterolateral right fourth and posterior left fourth ribs. Large lesion involving much of the T1 vertebral body with disruption of the posterior vertebral body cortex but no gross epidural tumor. Pathologic vertebral body fractures with mild vertebral body height loss at T7 and T8 and moderate height loss at T12. CT ABDOMEN PELVIS FINDINGS Hepatobiliary: No focal liver abnormality is seen. No gallstones, gallbladder wall thickening, or biliary dilatation. Pancreas: Unremarkable. Spleen: Unremarkable. Adrenals/Urinary Tract: Unremarkable adrenal glands. No evidence of renal mass, calculi, or hydronephrosis. Unremarkable bladder. Stomach/Bowel: The stomach is moderately distended by fluid and is otherwise unremarkable. There is no evidence of bowel obstruction or inflammation. The appendix is unremarkable.  Vascular/Lymphatic: Normal caliber of the abdominal aorta. No enlarged lymph nodes. Reproductive: Grossly unremarkable uterus and ovaries. Other: No ascites. Musculoskeletal: Widespread lytic bone lesions throughout the spine and pelvis with the lumbar spine evaluated in detail on the separate dedicated spine CT. IMPRESSION: 1. Widespread lytic lesions throughout the visualized skeleton consistent with metastatic disease or multiple myeloma. No definite primary malignancy identified in the chest, abdomen, or pelvis. 2. Nonspecific pulmonary ground-glass opacities in the left greater than right upper lobes, likely infectious or inflammatory. Electronically Signed   By: Logan Bores M.D.   On: 04/02/2020 13:12    04/03/2020 FISH    ASSESSMENT & PLAN:    45 year old very pleasant lady with  1) Newly diagnosed multiple myeloma with extensive bone lesions 2) hypercalcemia due to multiple myeloma-now resolved with IV fluids, calcitonin, pamidronate. 3) anemia due to multiple myeloma becoming more apparent as the patient's hemoconcentration due to dehydration has been corrected.   4) acute renal failure related to dehydration hypercalcemia and multiple myeloma.  Renal function is improving with IV fluids and improving calcium levels 5) multilevel pathologic fractures in the spine most symptomatic at L4-5 with some epidural tumor and left lower extremity radicular pain 6) severe constipation related to chronic constipation plus hypercalcemia plus   PLAN: -Discussed pt labwork today, 04/15/2020; Calcium slightly lower, Hgb stable, blood counts stable, chemistries improved. Kidney numbers normal.Total Protein decreased/improved. -Discussed genetic testing results. Advised pt she does not have traditional high risk mutations, she has adverse features and other high-risk mutations.  -Advised pt that most patients with 1p mutation respond well. Will continue to monitor and use appropriate treatment options  for best response. -Recommended pt continue to eat calcium-rich foods.  -Will increase Cytoxan dosage as labs received to achieve best response. -Continue Vitamin D daily. Will increase dosage and Rx Ergocalciferol. Goal Vitamin D. -Will continue with treatment as planned right now. Will continue to monitor. -Emphasized importance of pt getting opinion on her leg and weakness for risk of fractures. The pt is agreeable and will f/u. The pt has an appt on March 21 as of right now. -Recommended a lower back brace for movement prn. -Advised pt to be careful with steps. Start PT once able to but do not rush or hurt it more.  -Refill Fentanyl patch, Oxycodone, Lorazepam. Rx Ergocalciferol. -Will see back with C2D1 and next dose Zometa.   FOLLOW UP: Today is cycle 1 day 8 patient will follow up to finish remaining cycle 1 of treatment as scheduled. Please schedule cycle 2 of treatment (day 1, day 4, day 8, day 11) as ordered. Labs on day 1 and day 8 of each cycle. MD visit on cycle 2-day  1. Start Zometa on cycle 2-day 1 and schedule every 4 weeks x 6 Follow-up for appointment with Dr. Janice Coffin and interventional radiology as scheduled.  All of the patients questions were answered with apparent satisfaction. The patient knows to call the clinic with any problems, questions or concerns.  I spent 30 minutes counseling the patient face to face. The total time spent in the appointment was 40 minutes and more than 50% was on counseling and direct patient cares.    Sullivan Lone MD [place] AAHIVMS [facility] Hematology/Oncology Physician [facility]  (Office):       [phone] (Work cell):  [phone] (Fax):           [phone]  04/14/2020 11:06 AM  I, Reinaldo Raddle, am acting as scribe for Dr. Sullivan Lone, MD.   .I have reviewed the above documentation for accuracy and completeness, and I agree with the above. Brunetta Genera MD

## 2020-04-15 ENCOUNTER — Inpatient Hospital Stay: Payer: BC Managed Care – PPO

## 2020-04-15 ENCOUNTER — Other Ambulatory Visit: Payer: Self-pay

## 2020-04-15 ENCOUNTER — Other Ambulatory Visit: Payer: Self-pay | Admitting: *Deleted

## 2020-04-15 ENCOUNTER — Inpatient Hospital Stay: Payer: BC Managed Care – PPO | Admitting: Hematology

## 2020-04-15 ENCOUNTER — Ambulatory Visit
Admission: RE | Admit: 2020-04-15 | Discharge: 2020-04-15 | Disposition: A | Discharge status: Home / Self Care | Payer: BC Managed Care – PPO | Source: Ambulatory Visit | Attending: Radiation Oncology | Admitting: Radiation Oncology

## 2020-04-15 DIAGNOSIS — C9000 Multiple myeloma not having achieved remission: Secondary | ICD-10-CM

## 2020-04-15 DIAGNOSIS — Z5111 Encounter for antineoplastic chemotherapy: Secondary | ICD-10-CM | POA: Diagnosis not present

## 2020-04-15 DIAGNOSIS — Z7189 Other specified counseling: Secondary | ICD-10-CM

## 2020-04-15 DIAGNOSIS — G893 Neoplasm related pain (acute) (chronic): Secondary | ICD-10-CM | POA: Diagnosis not present

## 2020-04-15 DIAGNOSIS — C7951 Secondary malignant neoplasm of bone: Secondary | ICD-10-CM | POA: Diagnosis not present

## 2020-04-15 DIAGNOSIS — Z510 Encounter for antineoplastic radiation therapy: Secondary | ICD-10-CM | POA: Diagnosis not present

## 2020-04-15 DIAGNOSIS — Z79899 Other long term (current) drug therapy: Secondary | ICD-10-CM | POA: Diagnosis not present

## 2020-04-15 DIAGNOSIS — Z5112 Encounter for antineoplastic immunotherapy: Secondary | ICD-10-CM | POA: Diagnosis not present

## 2020-04-15 LAB — CMP (CANCER CENTER ONLY)
ALT: 27 U/L (ref 0–44)
AST: 10 U/L — ABNORMAL LOW (ref 15–41)
Albumin: 2.6 g/dL — ABNORMAL LOW (ref 3.5–5.0)
Alkaline Phosphatase: 325 U/L — ABNORMAL HIGH (ref 38–126)
Anion gap: 5 (ref 5–15)
BUN: 18 mg/dL (ref 6–20)
CO2: 22 mmol/L (ref 22–32)
Calcium: 8.1 mg/dL — ABNORMAL LOW (ref 8.9–10.3)
Chloride: 107 mmol/L (ref 98–111)
Creatinine: 0.8 mg/dL (ref 0.44–1.00)
GFR, Estimated: >60 mL/min (ref >60)
Glucose, Bld: 132 mg/dL — ABNORMAL HIGH (ref 70–99)
Potassium: 3.9 mmol/L (ref 3.5–5.1)
Sodium: 134 mmol/L — ABNORMAL LOW (ref 135–145)
Total Bilirubin: 0.3 mg/dL (ref 0.3–1.2)
Total Protein: 7.7 g/dL (ref 6.5–8.1)

## 2020-04-15 LAB — CBC WITH DIFFERENTIAL (CANCER CENTER ONLY)
Abs Immature Granulocytes: 0.04 10*3/uL (ref 0.00–0.07)
Basophils Absolute: 0 10*3/uL (ref 0.0–0.1)
Basophils Relative: 0 %
Eosinophils Absolute: 0.1 10*3/uL (ref 0.0–0.5)
Eosinophils Relative: 3 %
HCT: 28.3 % — ABNORMAL LOW (ref 36.0–46.0)
Hemoglobin: 9 g/dL — ABNORMAL LOW (ref 12.0–15.0)
Immature Granulocytes: 1 %
Lymphocytes Relative: 5 %
Lymphs Abs: 0.2 10*3/uL — ABNORMAL LOW (ref 0.7–4.0)
MCH: 31.7 pg (ref 26.0–34.0)
MCHC: 31.8 g/dL (ref 30.0–36.0)
MCV: 99.6 fL (ref 80.0–100.0)
Monocytes Absolute: 0.2 10*3/uL (ref 0.1–1.0)
Monocytes Relative: 4 %
Neutro Abs: 3.1 10*3/uL (ref 1.7–7.7)
Neutrophils Relative %: 87 %
Platelet Count: 142 10*3/uL — ABNORMAL LOW (ref 150–400)
RBC: 2.84 MIL/uL — ABNORMAL LOW (ref 3.87–5.11)
RDW: 16.1 % — ABNORMAL HIGH (ref 11.5–15.5)
WBC Count: 3.6 10*3/uL — ABNORMAL LOW (ref 4.0–10.5)
nRBC: 0 % (ref 0.0–0.2)

## 2020-04-15 LAB — SAMPLE TO BLOOD BANK

## 2020-04-15 MED ORDER — FENTANYL 25 MCG/HR TD PT72: 1.0000 | MEDICATED_PATCH | TRANSDERMAL | 0 refills | Status: DC

## 2020-04-15 MED ORDER — SODIUM CHLORIDE 0.9 % IV SOLN
Freq: Once | INTRAVENOUS | Status: AC
  Filled 2020-04-15: qty 250

## 2020-04-15 MED ORDER — PALONOSETRON HCL INJECTION 0.25 MG/5ML
INTRAVENOUS | Status: AC
  Filled 2020-04-15: qty 5

## 2020-04-15 MED ORDER — SODIUM CHLORIDE 0.9 % IV SOLN
400.0000 mg/m2 | Freq: Once | INTRAVENOUS | Status: AC
  Administered 2020-04-15: 880 mg via INTRAVENOUS
  Filled 2020-04-15: qty 44

## 2020-04-15 MED ORDER — SODIUM CHLORIDE 0.9 % IV SOLN
40.0000 mg | Freq: Once | INTRAVENOUS | Status: AC
  Administered 2020-04-15: 40 mg via INTRAVENOUS
  Filled 2020-04-15: qty 4

## 2020-04-15 MED ORDER — BORTEZOMIB CHEMO SQ INJECTION 3.5 MG (2.5MG/ML)
1.3000 mg/m2 | Freq: Once | INTRAMUSCULAR | Status: AC
  Administered 2020-04-15: 2.75 mg via SUBCUTANEOUS
  Filled 2020-04-15: qty 1.1

## 2020-04-15 MED ORDER — OXYCODONE HCL 5 MG PO TABS: 5.0000 mg | ORAL_TABLET | Freq: Four times a day (QID) | ORAL | 0 refills | Status: DC | PRN

## 2020-04-15 MED ORDER — ERGOCALCIFEROL 1.25 MG (50000 UT) PO CAPS: 50000.0000 [IU] | ORAL_CAPSULE | ORAL | 2 refills | Status: DC

## 2020-04-15 MED ORDER — PALONOSETRON HCL INJECTION 0.25 MG/5ML
0.2500 mg | Freq: Once | INTRAVENOUS | Status: AC
  Administered 2020-04-15: 0.25 mg via INTRAVENOUS

## 2020-04-15 MED ORDER — LORAZEPAM 0.5 MG PO TABS: 0.5000 mg | ORAL_TABLET | Freq: Four times a day (QID) | ORAL | 0 refills | Status: DC | PRN

## 2020-04-15 NOTE — Patient Instructions (Signed)
Thank you for choosing [facility] to provide your oncology and hematology care.   Should you have questions after your visit to the [facility] ([facility]), please contact this office at [phone] between 8:30 AM and 4:30 PM.  Voice mails left after 4:00 PM may not be returned until the following business day.  Calls received after 4:30 PM will be answered by an off-site Nurse Triage Line.    Prescription Refills:  Please have your pharmacy contact us directly for most prescription requests.  Contact the office directly for refills of narcotics (pain medications). Allow 48-72 hours for refills.  Appointments: Please contact the [facility] scheduling department [phone] for questions regarding [facility] appointment scheduling.  Contact the schedulers with any scheduling changes so that your appointment can be rescheduled in a timely manner.   Central Scheduling for [place] [phone] - Call to schedule procedures such as PET scans, CT scans, MRI, Ultrasound, etc.  To afford each patient quality time with our providers, please arrive 30 minutes before your scheduled appointment time.  If you arrive late for your appointment, you may be asked to reschedule.  We strive to give you quality time with our providers, and arriving late affects you and other patients whose appointments are after yours. If you are a no show for multiple scheduled visits, you may be dismissed from the clinic at the providers discretion.     Resources: [facility] Social Workers [phone] for additional information on assistance programs or assistance connecting with community support programs   [place] DSS  [phone]: Information regarding food stamps, Medicaid, and utility assistance [place] [employer] [place] [phone]   [place] [employer]'s shared-ride transportation service for eligible riders who have a disability that prevents them from riding the fixed route bus.   Medicare  Rights Center 800-333-4114 Helps people with Medicare understand their rights and benefits, navigate the Medicare system, and secure the quality healthcare they deserve [employer] 800-227-2345 Assists patients locate various types of support and financial assistance Cancer Care: 1-800-813-HOPE (4673) Provides financial assistance, online support groups, medication/co-pay assistance.   Transportation Assistance for appointments at [facility]: Transportation Coordinator [phone]  Again, thank you for choosing [facility] for your care.       

## 2020-04-15 NOTE — Patient Instructions (Signed)
[place] Discharge Instructions for Patients Receiving Chemotherapy  Today you received the following chemotherapy agents: Bortezomib and Cyclophosphomide   To help prevent nausea and vomiting after your treatment, we encourage you to take your nausea medication as directed.   If you develop nausea and vomiting that is not controlled by your nausea medication, call the clinic.   BELOW ARE SYMPTOMS THAT SHOULD BE REPORTED IMMEDIATELY:  *FEVER GREATER THAN 100.5 F  *CHILLS WITH OR WITHOUT FEVER  NAUSEA AND VOMITING THAT IS NOT CONTROLLED WITH YOUR NAUSEA MEDICATION  *UNUSUAL SHORTNESS OF BREATH  *UNUSUAL BRUISING OR BLEEDING  TENDERNESS IN MOUTH AND THROAT WITH OR WITHOUT PRESENCE OF ULCERS  *URINARY PROBLEMS  *BOWEL PROBLEMS  UNUSUAL RASH Items with * indicate a potential emergency and should be followed up as soon as possible.  Feel free to call the clinic should you have any questions or concerns. The clinic phone number is [phone].  Please show the [place] at check-in to the Emergency Department and triage nurse.

## 2020-04-16 ENCOUNTER — Ambulatory Visit
Admission: RE | Admit: 2020-04-16 | Discharge: 2020-04-16 | Disposition: A | Discharge status: Home / Self Care | Payer: BC Managed Care – PPO | Source: Ambulatory Visit | Attending: Internal Medicine | Admitting: Internal Medicine

## 2020-04-16 ENCOUNTER — Encounter (HOSPITAL_COMMUNITY): Payer: Self-pay | Admitting: Hematology

## 2020-04-16 ENCOUNTER — Ambulatory Visit
Admission: RE | Admit: 2020-04-16 | Discharge: 2020-04-16 | Disposition: A | Discharge status: Home / Self Care | Payer: BC Managed Care – PPO | Source: Ambulatory Visit | Attending: Radiation Oncology | Admitting: Radiation Oncology

## 2020-04-16 DIAGNOSIS — S22000A Wedge compression fracture of unspecified thoracic vertebra, initial encounter for closed fracture: Secondary | ICD-10-CM

## 2020-04-16 DIAGNOSIS — C9000 Multiple myeloma not having achieved remission: Secondary | ICD-10-CM | POA: Insufficient documentation

## 2020-04-16 DIAGNOSIS — Z510 Encounter for antineoplastic radiation therapy: Secondary | ICD-10-CM | POA: Insufficient documentation

## 2020-04-16 DIAGNOSIS — S22080A Wedge compression fracture of T11-T12 vertebra, initial encounter for closed fracture: Secondary | ICD-10-CM | POA: Diagnosis not present

## 2020-04-16 DIAGNOSIS — S32040A Wedge compression fracture of fourth lumbar vertebra, initial encounter for closed fracture: Secondary | ICD-10-CM | POA: Diagnosis not present

## 2020-04-16 HISTORY — PX: IR RADIOLOGIST EVAL & MGMT: IMG5224

## 2020-04-16 LAB — KAPPA/LAMBDA LIGHT CHAINS
Kappa free light chain: 7.8 mg/L (ref 3.3–19.4)
Kappa, lambda light chain ratio: 1.95 — ABNORMAL HIGH (ref 0.26–1.65)
Lambda free light chains: 4 mg/L — ABNORMAL LOW (ref 5.7–26.3)

## 2020-04-17 ENCOUNTER — Encounter: Payer: Self-pay | Admitting: Radiation Oncology

## 2020-04-17 ENCOUNTER — Ambulatory Visit
Admission: RE | Admit: 2020-04-17 | Discharge: 2020-04-17 | Disposition: A | Discharge status: Home / Self Care | Payer: BC Managed Care – PPO | Source: Ambulatory Visit | Attending: Radiation Oncology | Admitting: Radiation Oncology

## 2020-04-17 DIAGNOSIS — Z510 Encounter for antineoplastic radiation therapy: Secondary | ICD-10-CM | POA: Diagnosis not present

## 2020-04-17 DIAGNOSIS — C9000 Multiple myeloma not having achieved remission: Secondary | ICD-10-CM | POA: Diagnosis not present

## 2020-04-17 DIAGNOSIS — G893 Neoplasm related pain (acute) (chronic): Secondary | ICD-10-CM | POA: Diagnosis not present

## 2020-04-17 DIAGNOSIS — C7931 Secondary malignant neoplasm of brain: Secondary | ICD-10-CM | POA: Diagnosis not present

## 2020-04-17 LAB — MULTIPLE MYELOMA PANEL, SERUM
Albumin SerPl Elph-Mcnc: 3 g/dL (ref 2.9–4.4)
Albumin/Glob SerPl: 0.8 (ref 0.7–1.7)
Alpha 1: 0.3 g/dL (ref 0.0–0.4)
Alpha2 Glob SerPl Elph-Mcnc: 0.8 g/dL (ref 0.4–1.0)
B-Globulin SerPl Elph-Mcnc: 0.9 g/dL (ref 0.7–1.3)
Gamma Glob SerPl Elph-Mcnc: 1.9 g/dL — ABNORMAL HIGH (ref 0.4–1.8)
Globulin, Total: 4 g/dL — ABNORMAL HIGH (ref 2.2–3.9)
IgA: 31 mg/dL — ABNORMAL LOW (ref 87–352)
IgG (Immunoglobin G), Serum: 2464 mg/dL — ABNORMAL HIGH (ref 586–1602)
IgM (Immunoglobulin M), Srm: 11 mg/dL — ABNORMAL LOW (ref 26–217)
M Protein SerPl Elph-Mcnc: 1.8 g/dL — ABNORMAL HIGH
Total Protein ELP: 7 g/dL (ref 6.0–8.5)

## 2020-04-18 ENCOUNTER — Encounter (HOSPITAL_COMMUNITY): Payer: Self-pay | Admitting: Hematology

## 2020-04-18 ENCOUNTER — Other Ambulatory Visit: Payer: Self-pay

## 2020-04-18 ENCOUNTER — Inpatient Hospital Stay: Payer: BC Managed Care – PPO | Attending: Hematology

## 2020-04-18 VITALS — BP 130/82 | HR 82 | Temp 98.0°F | Resp 18 | Wt 198.5 lb

## 2020-04-18 DIAGNOSIS — C9000 Multiple myeloma not having achieved remission: Secondary | ICD-10-CM

## 2020-04-18 DIAGNOSIS — Z7189 Other specified counseling: Secondary | ICD-10-CM

## 2020-04-18 DIAGNOSIS — Z5112 Encounter for antineoplastic immunotherapy: Secondary | ICD-10-CM | POA: Diagnosis not present

## 2020-04-18 DIAGNOSIS — Z79899 Other long term (current) drug therapy: Secondary | ICD-10-CM | POA: Diagnosis not present

## 2020-04-18 DIAGNOSIS — D630 Anemia in neoplastic disease: Secondary | ICD-10-CM | POA: Diagnosis not present

## 2020-04-18 MED ORDER — PROCHLORPERAZINE MALEATE 10 MG PO TABS
ORAL_TABLET | ORAL | Status: AC
  Filled 2020-04-18: qty 1

## 2020-04-18 MED ORDER — PROCHLORPERAZINE MALEATE 10 MG PO TABS
10.0000 mg | ORAL_TABLET | Freq: Once | ORAL | Status: AC
  Administered 2020-04-18: 10 mg via ORAL

## 2020-04-18 MED ORDER — BORTEZOMIB CHEMO SQ INJECTION 3.5 MG (2.5MG/ML)
1.3000 mg/m2 | Freq: Once | INTRAMUSCULAR | Status: AC
  Administered 2020-04-18: 2.75 mg via SUBCUTANEOUS
  Filled 2020-04-18: qty 1.1

## 2020-04-18 NOTE — Patient Instructions (Signed)
[place] Discharge Instructions for Patients Receiving Chemotherapy  Today you received the following chemotherapy agents: bortezomib.  To help prevent nausea and vomiting after your treatment, we encourage you to take your nausea medication as directed.   If you develop nausea and vomiting that is not controlled by your nausea medication, call the clinic.   BELOW ARE SYMPTOMS THAT SHOULD BE REPORTED IMMEDIATELY:  *FEVER GREATER THAN 100.5 F  *CHILLS WITH OR WITHOUT FEVER  NAUSEA AND VOMITING THAT IS NOT CONTROLLED WITH YOUR NAUSEA MEDICATION  *UNUSUAL SHORTNESS OF BREATH  *UNUSUAL BRUISING OR BLEEDING  TENDERNESS IN MOUTH AND THROAT WITH OR WITHOUT PRESENCE OF ULCERS  *URINARY PROBLEMS  *BOWEL PROBLEMS  UNUSUAL RASH Items with * indicate a potential emergency and should be followed up as soon as possible.  Feel free to call the clinic should you have any questions or concerns. The clinic phone number is [phone].  Please show the [place] at check-in to the Emergency Department and triage nurse.  Bortezomib injection What is this medicine? BORTEZOMIB (bor TEZ oh mib) targets proteins in cancer cells and stops the cancer cells from growing. It treats multiple myeloma and mantle cell lymphoma. This medicine may be used for other purposes; ask your health care provider or pharmacist if you have questions. COMMON BRAND NAME(S): Velcade What should I tell my health care provider before I take this medicine? They need to know if you have any of these conditions:  dehydration  diabetes (high blood sugar)  heart disease  liver disease  tingling of the fingers or toes or other nerve disorder  an unusual or allergic reaction to bortezomib, mannitol, boron, other medicines, foods, dyes, or preservatives  pregnant or trying to get pregnant  breast-feeding How should I use this medicine? This medicine is injected into a vein or under the  skin. It is given by a health care provider in a hospital or clinic setting. Talk to your health care provider about the use of this medicine in children. Special care may be needed. Overdosage: If you think you have taken too much of this medicine contact a poison control center or emergency room at once. NOTE: This medicine is only for you. Do not share this medicine with others. What if I miss a dose? Keep appointments for follow-up doses. It is important not to miss your dose. Call your health care provider if you are unable to keep an appointment. What may interact with this medicine? This medicine may interact with the following medications:  ketoconazole  rifampin This list may not describe all possible interactions. Give your health care provider a list of all the medicines, herbs, non-prescription drugs, or dietary supplements you use. Also tell them if you smoke, drink alcohol, or use illegal drugs. Some items may interact with your medicine. What should I watch for while using this medicine? Your condition will be monitored carefully while you are receiving this medicine. You may need blood work done while you are taking this medicine. You may get drowsy or dizzy. Do not drive, use machinery, or do anything that needs mental alertness until you know how this medicine affects you. Do not stand up or sit up quickly, especially if you are an older patient. This reduces the risk of dizzy or fainting spells This medicine may increase your risk of getting an infection. Call your health care provider for advice if you get a fever, chills, sore throat, or other symptoms of a  cold or flu. Do not treat yourself. Try to avoid being around people who are sick. Check with your health care provider if you have severe diarrhea, nausea, and vomiting, or if you sweat a lot. The loss of too much body fluid may make it dangerous for you to take this medicine. Do not become pregnant while taking this  medicine or for 7 months after stopping it. Women should inform their health care provider if they wish to become pregnant or think they might be pregnant. Men should not father a child while taking this medicine and for 4 months after stopping it. There is a potential for serious harm to an unborn child. Talk to your health care provider for more information. Do not breast-feed an infant while taking this medicine or for 2 months after stopping it. This medicine may make it more difficult to get pregnant or father a child. Talk to your health care provider if you are concerned about your fertility. What side effects may I notice from receiving this medicine? Side effects that you should report to your doctor or health care professional as soon as possible:  allergic reactions (skin rash; itching or hives; swelling of the face, lips, or tongue)  bleeding (bloody or black, tarry stools; red or dark brown urine; spitting up blood or brown material that looks like coffee grounds; red spots on the skin; unusual bruising or bleeding from the eye, gums, or nose)  blurred vision or changes in vision  confusion  constipation  headache  heart failure (trouble breathing; fast, irregular heartbeat; sudden weight gain; swelling of the ankles, feet, hands)  infection (fever, chills, cough, sore throat, pain or trouble passing urine)  lack or loss of appetite  liver injury (dark yellow or brown urine; general ill feeling or flu-like symptoms; loss of appetite, right upper belly pain; yellowing of the eyes or skin)  low blood pressure (dizziness; feeling faint or lightheaded, falls; unusually weak or tired)  muscle cramps  pain, redness, or irritation at site where injected  pain, tingling, numbness in the hands or feet  seizures  trouble breathing  unusual bruising or bleeding Side effects that usually do not require medical attention (report to your doctor or health care professional if they  continue or are bothersome):  diarrhea  nausea  stomach pain  trouble sleeping  vomiting This list may not describe all possible side effects. Call your doctor for medical advice about side effects. You may report side effects to FDA at 1-800-FDA-1088. Where should I keep my medicine? This medicine is given in a hospital or clinic. It will not be stored at home. NOTE: This sheet is a summary. It may not cover all possible information. If you have questions about this medicine, talk to your doctor, pharmacist, or health care provider.  2021 Elsevier/Gold Standard (2020-01-25 13:22:53)

## 2020-04-19 ENCOUNTER — Telehealth: Payer: Self-pay | Admitting: *Deleted

## 2020-04-19 LAB — SURGICAL PATHOLOGY

## 2020-04-19 NOTE — Telephone Encounter (Signed)
Patient called - Her PET is scheduled for 04/24/20 at 3pm. She told radiology that she is very claustrophobic. She told them she wanted to be 'knocked out' for the hour long test. They told her to call her doctor. Dr. Irene Limbo informed Contacted patient with Dr. Grier Mitts response: She may use 1 to 2 tablets of her lorazepam that is 0.5 to 1 mg 30 to 45 minutes prior to her PET scan to help with anxiety and sedation. We would not completely knock her out for a PET scan. If her claustrophobia is severe despite the lorazepam we will have to cancel the PET scan. Please inform patient that PET is not like an MRI.  Patient verbalized understanding and states she will let office know if she is unable to complete scan.

## 2020-04-22 NOTE — Progress Notes (Signed)
  Patient Name: IMOGEAN CIAMPA MRN: [id number] DOB: 09-15-1975 Referring Physician: Marzetta Board (Profile Not Attached) Date of Service: 04/17/2020 [facility], [place]                                                        End Of Treatment Note  Diagnoses: C90.00-Multiple myeloma not having achieved remission  Cancer Staging: Multiple myeloma with widespread bony disease and pain secondary to bony lesions.   Intent: Palliative  Radiation Treatment Dates: 04/04/2020 through 04/17/2020 Site Technique Total Dose (Gy) Dose per Fx (Gy) Completed Fx Beam Energies  Femur Left: Ext_Lt 3D 25/25 2.5 10/10 15X  Femur Right: Ext_Rt 3D 25/25 2.5 10/10 15X  Lumbar Spine: Spine_T12-L5 3D 30/30 3 10/10 15X  Thoracic Spine: Spine_T1/Rib3 3D 25/25 2.5 10/10 10X   Narrative: The patient tolerated radiation therapy relatively well. She was still having left hip pain at the end of radiotherapy and some esophagitis as well.  Plan: The patient will receive a call in about one month from the radiation oncology department. She will continue follow up with Dr. Irene Limbo as well.   ________________________________________________    Carola Rhine, [facility]

## 2020-04-23 ENCOUNTER — Encounter: Payer: Self-pay | Admitting: *Deleted

## 2020-04-23 DIAGNOSIS — C9000 Multiple myeloma not having achieved remission: Secondary | ICD-10-CM | POA: Diagnosis not present

## 2020-04-23 NOTE — Consult Note (Signed)
Chief Complaint: Patient was consulted remotely today (TeleHealth) for painful pathologic lumbar fracture at the request of Hall,Carole N.    Referring Physician(s): Hall,Carole N  History of Present Illness: Stacey Montoya is a 45 y.o. female Who presented earlier this year with several months of severe back pain, unrelieved by chiropractic treatments.  She also has severe left hip pain.  On evaluation she was noted to be hypercalcemic, 04/02/2020 CT demonstrated multiple lytic lesions suggesting metastatic disease or multiple myeloma. 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  She still has severe left lower extremity and hip pain.  She takes 25 mg fentanyl as needed, and 10 mg OxyContin once or twice a day.  She has basically no pain at rest.  Her lower extremity pain at its worst is 8-9 out of 10 on the visual analog pain scale.  Past Medical History:  Diagnosis Date  . Back pain     No past surgical history on file.  Allergies: Azithromycin  Medications: Prior to Admission medications   Medication Sig Start Date End Date Taking? Authorizing Provider  acyclovir (ZOVIRAX) 400 MG tablet Take 1 tablet (400 mg total) by mouth 2 (two) times daily. 04/05/20   Brunetta Genera, MD  albuterol (VENTOLIN HFA) 108 (90 Base) MCG/ACT inhaler Inhale 1-2 puffs into the lungs as needed for wheezing. 02/01/20   [provider]  amLODipine (NORVASC) 5 MG tablet Take 1 tablet (5 mg total) by mouth daily. 04/10/20 07/09/20  Kayleen Memos, DO  cholecalciferol (VITAMIN D) 25 MCG tablet Take 2 tablets (2,000 Units total) by mouth daily. 04/10/20 07/09/20  Kayleen Memos, DO  dexamethasone (DECADRON) 4 MG tablet Take 1 tablet (4 mg total) by mouth daily. 04/09/20 05/09/20  Kayleen Memos,  DO  ergocalciferol (VITAMIN D2) 1.25 MG (50000 UT) capsule Take 1 capsule (50,000 Units total) by mouth once a week. 04/15/20   Brunetta Genera, MD  fentaNYL (DURAGESIC) 25 MCG/HR Place 1 patch onto the skin every 3 (three) days. 04/15/20 05/15/20  Brunetta Genera, MD  gabapentin (NEURONTIN) 300 MG capsule Take 1 capsule (300 mg total) by mouth 2 (two) times daily. 04/09/20 07/08/20  Kayleen Memos, DO  LORazepam (ATIVAN) 0.5 MG tablet Take 1 tablet (0.5 mg total) by mouth every 6 (six) hours as needed (Nausea or vomiting). 04/15/20   Brunetta Genera, MD  metoprolol tartrate (LOPRESSOR) 50 MG tablet Take 1 tablet (50 mg total) by mouth 2 (two) times daily. 04/09/20 07/08/20  Kayleen Memos, DO  nicotine (NICODERM CQ - DOSED IN MG/24 HOURS) 21 mg/24hr patch Place 1 patch (21 mg total) onto the skin daily. 04/10/20   Kayleen Memos, DO  norethindrone (MICRONOR) 0.35 MG tablet Take 1 tablet by mouth daily. 03/15/20   [provider]  ondansetron (ZOFRAN) 4 MG tablet Take 4 mg by mouth every 8 (eight) hours. 03/19/20   [provider]  ondansetron (ZOFRAN) 8 MG tablet Take 1 tablet (8 mg total) by mouth 2 (two) times daily as needed for refractory nausea / vomiting. Start on day 3 after Cytoxan. 04/05/20   Brunetta Genera, MD  oxyCODONE (OXY IR/ROXICODONE) 5 MG immediate release tablet Take 1-2 tablets (5-10 mg total) by mouth every 6 (six) hours as needed for moderate pain, severe pain or breakthrough  pain. 04/15/20   Brunetta Genera, MD  polyethylene glycol (MIRALAX / GLYCOLAX) 17 g packet Take 17 g by mouth daily. 04/10/20   Kayleen Memos, DO  prochlorperazine (COMPAZINE) 10 MG tablet Take 1 tablet (10 mg total) by mouth every 6 (six) hours as needed (Nausea or vomiting). 04/05/20   Brunetta Genera, MD     Family History  Problem Relation Age of Onset  . Lung cancer Maternal Grandfather   . Pancreatic cancer Paternal Grandfather     Social History    Socioeconomic History  . Marital status: Married    Spouse name: Not on file  . Number of children: Not on file  . Years of education: Not on file  . Highest education level: Not on file  Occupational History  . Not on file  Tobacco Use  . Smoking status: Current Every Day Smoker    Packs/day: 0.50    Years: 20.00    Pack years: 10.00    Types: Cigarettes  . Smokeless tobacco: Never Used  Vaping Use  . Vaping Use: Every day  Substance and Sexual Activity  . Alcohol use: Not Currently  . Drug use: Not Currently  . Sexual activity: Not on file  Other Topics Concern  . Not on file  Social History Narrative  . Not on file   Social Determinants of Health   Financial Resource Strain: Not on file  Food Insecurity: Not on file  Transportation Needs: Not on file  Physical Activity: Not on file  Stress: Not on file  Social Connections: Not on file    ECOG Status: 2 - Symptomatic, <50% confined to bed  Review of Systems  Review of Systems: A 12 point ROS discussed and pertinent positives are indicated in the HPI above.  All other systems are negative.  Physical Exam No direct physical exam was performed (except for noted visual exam findings with Video Visits).     Vital Signs: LMP 03/23/2020   Imaging: DG Chest 2 View  Result Date: 04/02/2020 CLINICAL DATA:  45 year old female with tachycardia and low grade fever. EXAM: CHEST - 2 VIEW COMPARISON:  03/10/2018 FINDINGS: The heart size and mediastinal contours are within normal limits. Both lungs are clear. The visualized skeletal structures are unremarkable. IMPRESSION: No acute cardiopulmonary process. Electronically Signed   By: Ruthann Cancer MD   On: 04/02/2020 09:09   CT L-SPINE NO CHARGE  Result Date: 04/02/2020 CLINICAL DATA:  Back pain.  Pathologic fracture with hypercalcemia. EXAM: CT LUMBAR SPINE WITHOUT CONTRAST TECHNIQUE: Multidetector CT imaging of the lumbar spine was performed without intravenous contrast  administration. Multiplanar CT image reconstructions were also generated. COMPARISON:  Thoracolumbar spine radiographs 03/07/2020 FINDINGS: Segmentation: 5 lumbar type vertebrae. Alignment: Straightening of the normal lumbar lordosis. No significant listhesis. Vertebrae: Widespread lytic bone lesions throughout the lumbar spine as well as included lower thoracic spine and pelvis. Pathologic T12 compression fracture with 50% vertebral body height loss, progressed from the prior radiographs. New pathologic L4 fracture involving the vertebral body and pedicles with 85% vertebral body height loss centrally and 6 mm retropulsion of the posterior vertebral cortex. Large lesion involving the left pedicle, pars, and transverse process of L1 with possible nondisplaced pathologic fracture. Paraspinal and other soft tissues: No acute paraspinal soft tissue abnormality. Intra-abdominal and pelvic contents reported separately. 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 IMPRESSION: 1. Widespread lytic bone lesions consistent with metastatic disease. 2. Pathologic T12 and L4  vertebral fractures as above. Possible nondisplaced pathologic fracture of the left L1 posterior elements. 3. 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 Electronically Signed   By: Logan Bores M.D.   On: 04/02/2020 12:52   DG CHEST PORT 1 VIEW  Result Date: 04/04/2020 CLINICAL DATA:  Multiple myeloma EXAM: PORTABLE CHEST 1 VIEW COMPARISON:  Portable exam 1812 hours compared to 04/02/2020 FINDINGS: Stable heart size and pulmonary vascularity. Prominent soft tissue at RIGHT mediastinal border just above the RIGHT hilum, increased in prominence since the previous exam, corresponding to paraspinal mass on prior CT study. Scattered interstitial infiltrates are seen in both lungs which could represent atypical infection or edema. No pleural effusion or pneumothorax. Bones demineralized. Bone  destruction of the posterior RIGHT third rib identified. IMPRESSION: Scattered interstitial infiltrates, question edema versus atypical infection. Electronically Signed   By: Lavonia Dana M.D.   On: 04/04/2020 18:35   DG Bone Survey Met  Result Date: 04/02/2020 CLINICAL DATA:  Lytic lesion on x-ray.  Possible multiple myeloma. EXAM: METASTATIC BONE SURVEY COMPARISON:  CT of same day. FINDINGS: Ill-defined lucencies are noted throughout the skull which may represent lytic lesions. Lucencies are noted in the bilateral humeri. Lytic lesions are seen involving the right third and fourth ribs. Probable lytic lesion is seen involving the left fourth rib. Lytic lesions are noted in the left scapula. Fractures are seen involving the T12 and L4 vertebral bodies consistent with pathologic fractures. Lucencies are seen throughout the pelvis and visualized proximal femurs consistent with lytic lesions. IMPRESSION: Multiple lytic lesions are noted consistent with multiple myeloma or metastatic disease. Pathologic fractures are seen involving the T12 and L4 vertebral bodies. Electronically Signed   By: Marijo Conception M.D.   On: 04/02/2020 15:29   CT BONE MARROW BIOPSY & ASPIRATION  Result Date: 04/03/2020 INDICATION: 45 year old female with newly diagnosed multiple myeloma. She presents for CT-guided bone marrow biopsy to assess for marrow involvement. EXAM: CT GUIDED BONE MARROW ASPIRATION AND CORE BIOPSY Interventional Radiologist:  Criselda Peaches, MD MEDICATIONS: None. ANESTHESIA/SEDATION: Moderate (conscious) sedation was employed during this procedure. A total of 4 milligrams versed and 150 micrograms fentanyl were administered intravenously. The patient's level of consciousness and vital signs were monitored continuously by radiology nursing throughout the procedure under my direct supervision. Total monitored sedation time: 18 minutes FLUOROSCOPY TIME:  None COMPLICATIONS: None immediate. Estimated blood loss:  <25 mL PROCEDURE: Informed written consent was obtained from the patient after a thorough discussion of the procedural risks, benefits and alternatives. All questions were addressed. Maximal Sterile Barrier Technique was utilized including caps, mask, sterile gowns, sterile gloves, sterile drape, hand hygiene and skin antiseptic. A timeout was performed prior to the initiation of the procedure. The patient was positioned prone and non-contrast localization CT was performed of the pelvis to demonstrate the iliac marrow spaces. Maximal barrier sterile technique utilized including caps, mask, sterile gowns, sterile gloves, large sterile drape, hand hygiene, and betadine prep. Under sterile conditions and local anesthesia, an 11 gauge coaxial bone biopsy needle was advanced into the right iliac marrow space. Needle position was confirmed with CT imaging. Initially, bone marrow aspiration was performed. Next, the 11 gauge outer cannula was utilized to obtain a right iliac bone marrow core biopsy. Needle was removed. Hemostasis was obtained with compression. The patient tolerated the procedure well. Samples were prepared with the cytotechnologist. IMPRESSION: Technically successful CT-guided bone marrow aspiration and core biopsy of the right iliac bone. Electronically  Signed   By: Jacqulynn Cadet M.D.   On: 04/03/2020 19:33   ECHOCARDIOGRAM COMPLETE  Result Date: 04/06/2020    ECHOCARDIOGRAM REPORT   Patient Name:   Stacey Montoya Devincent Date of Exam: 04/06/2020 Medical Rec #:  [id number]         Height:       64.0 in Accession #:    [id number]        Weight:       235.7 lb Date of Birth:  1975/04/15         BSA:          2.098 m Patient Age:    41 years          BP:           188/99 mmHg Patient Gender: F                 HR:           99 bpm. Exam Location:  Inpatient Procedure: 2D Echo, Cardiac Doppler and Color Doppler Indications:    Elevated Troponin  History:        Patient has no prior history of Echocardiogram  examinations.  Sonographer:    Bernadene Person RDCS Referring Phys: [id number] [place]  1. Left ventricular ejection fraction, by estimation, is 60 to 65%. The left ventricle has normal function. The left ventricle has no regional wall motion abnormalities. Left ventricular diastolic parameters are consistent with Grade I diastolic dysfunction (impaired relaxation). Elevated left ventricular end-diastolic pressure.  2. Right ventricular systolic function is normal. The right ventricular size is normal. There is mildly elevated pulmonary artery systolic pressure. The estimated right ventricular systolic pressure is 15.7 mmHg.  3. The mitral valve is normal in structure. Mild mitral valve regurgitation. No evidence of mitral stenosis.  4. The aortic valve is normal in structure. Aortic valve regurgitation is not visualized. No aortic stenosis is present.  5. The inferior vena cava is normal in size with greater than 50% respiratory variability, suggesting right atrial pressure of 3 mmHg. FINDINGS  Left Ventricle: Left ventricular ejection fraction, by estimation, is 60 to 65%. The left ventricle has normal function. The left ventricle has no regional wall motion abnormalities. The left ventricular internal cavity size was normal in size. There is  no left ventricular hypertrophy. Left ventricular diastolic parameters are consistent with Grade I diastolic dysfunction (impaired relaxation). Elevated left ventricular end-diastolic pressure. Right Ventricle: The right ventricular size is normal. No increase in right ventricular wall thickness. Right ventricular systolic function is normal. There is mildly elevated pulmonary artery systolic pressure. The tricuspid regurgitant velocity is 2.88  m/s, and with an assumed right atrial pressure of 3 mmHg, the estimated right ventricular systolic pressure is 26.2 mmHg. Left Atrium: Left atrial size was normal in size. Right Atrium: Right atrial size was normal in  size. Pericardium: There is no evidence of pericardial effusion. Mitral Valve: The mitral valve is normal in structure. There is mild thickening of the mitral valve leaflet(s). Mild mitral valve regurgitation. No evidence of mitral valve stenosis. Tricuspid Valve: The tricuspid valve is normal in structure. Tricuspid valve regurgitation is mild . No evidence of tricuspid stenosis. Aortic Valve: The aortic valve is normal in structure. Aortic valve regurgitation is not visualized. No aortic stenosis is present. Pulmonic Valve: The pulmonic valve was normal in structure. Pulmonic valve regurgitation is not visualized. No evidence of pulmonic stenosis. Aorta: The aortic root is normal in size  and structure. Venous: The inferior vena cava is normal in size with greater than 50% respiratory variability, suggesting right atrial pressure of 3 mmHg. IAS/Shunts: No atrial level shunt detected by color flow Doppler.  LEFT VENTRICLE PLAX 2D LVIDd:         5.08 cm  Diastology LVIDs:         3.00 cm  LV e' medial:    0.06 cm/s LV PW:         0.96 cm  LV E/e' medial:  17.5 LV IVS:        0.87 cm  LV e' lateral:   0.08 cm/s LVOT diam:     2.20 cm  LV E/e' lateral: 13.0 LV SV:         81 LV SV Index:   38 LVOT Area:     3.80 cm  RIGHT VENTRICLE RV S prime:     9.11 cm/s TAPSE (M-mode): 2.0 cm LEFT ATRIUM         Index LA diam:    3.30 cm 1.57 cm/m  AORTIC VALVE LVOT Vmax:   124.00 cm/s LVOT Vmean:  85.100 cm/s LVOT VTI:    0.212 m  AORTA Ao Root diam: 3.30 cm Ao Asc diam:  3.20 cm MITRAL VALVE                TRICUSPID VALVE MV Area (PHT): 2.60 cm     TR Peak grad:   33.2 mmHg MV Decel Time: 292 msec     TR Vmax:        288.00 cm/s MV E velocity: 0.98 cm/s MV A velocity: 102.00 cm/s  SHUNTS MV E/A ratio:  0.01         Systemic VTI:  0.21 m                             Systemic Diam: 2.20 cm Ena Dawley MD Electronically signed by Ena Dawley MD Signature Date/Time: 04/06/2020/1:18:07 PM    Final    DG FEMUR PORT MIN 2  VIEWS LEFT  Result Date: 04/02/2020 CLINICAL DATA:  Bilateral thigh pain.  History of myeloma. EXAM: LEFT FEMUR PORTABLE 2 VIEWS COMPARISON:  Femur exam on bone survey earlier today. Included portion from CT earlier today. FINDINGS: Lucent lesion in the lesser trochanter was better appreciated on CT earlier today. There are multiple small lucencies involving the femoral head and intertrochanteric femur that are not well seen by radiograph. No obvious destructive lesion in the femoral shaft. No evidence of pathologic fracture. IMPRESSION: Proximal femoral lesions including a lesion of the lesser trochanter, majority of these are better seen on CT earlier today. There is no evidence of acute femur fracture or large destructive lesion. Consider MRI for more detailed assessment. Electronically Signed   By: Keith Rake M.D.   On: 04/02/2020 21:21   DG FEMUR PORT, MIN 2 VIEWS RIGHT  Result Date: 04/02/2020 CLINICAL DATA:  Right thigh pain.  Myeloma. EXAM: RIGHT FEMUR PORTABLE 2 VIEW COMPARISON:  Included portion from bone survey earlier today. Included portion from pelvis CT earlier today. FINDINGS: Lucent lesion in the intertrochanteric femur on CT earlier today is only faintly visualized, this is at risk for pathologic fracture. There is some thinning of the lateral cortex of the greater trochanter. There are multiple additional small lucencies in the femoral head on CT that are not well seen by radiograph. Small lesion noted in the distal lateral aspect  of the femur in the region of the physeal scar. No evidence of acute fracture. IMPRESSION: Relatively large lucent lesion in the intertrochanteric femur on CT earlier today is only faintly visualized, this is at risk for pathologic fracture. There are multiple additional lucencies in the femoral head on CT are not well seen by radiograph. There is a small lesion in the distal lateral femoral metaphysis. MRI may be of value for more detailed assessment.  Electronically Signed   By: Keith Rake M.D.   On: 04/02/2020 21:23   CT CHEST ABDOMEN PELVIS WO CONTRAST  Result Date: 04/02/2020 CLINICAL DATA:  Thoracic and lumbar spine pain. Pathologic fracture with hypercalcemia. EXAM: CT CHEST, ABDOMEN AND PELVIS WITHOUT CONTRAST TECHNIQUE: Multidetector CT imaging of the chest, abdomen and pelvis was performed following the standard protocol without IV contrast. COMPARISON:  Chest radiographs 04/02/2020. Thoracolumbar spine radiographs 03/07/2020. FINDINGS: CT CHEST FINDINGS Cardiovascular: Normal caliber of the thoracic aorta. Normal heart size. No pericardial effusion. Mediastinum/Nodes: No enlarged axillary, mediastinal, or hilar lymph nodes are identified with hilar assessment limited in the absence of IV contrast. Unremarkable visualized thyroid and esophagus. Lungs/Pleura: No pleural effusion or pneumothorax. Patchy and hazy ground-glass opacities in the left greater than right upper lobes. No parenchymal lung mass. Musculoskeletal: Widespread lytic bone lesions throughout the visualized skeleton including a destructive 5 cm lesion involving the posterior right third rib and a 3 cm destructive lesion involving the posteromedial right sixth rib. Smaller destructive lesions with nondisplaced pathologic fractures of the posterolateral right fourth and posterior left fourth ribs. Large lesion involving much of the T1 vertebral body with disruption of the posterior vertebral body cortex but no gross epidural tumor. Pathologic vertebral body fractures with mild vertebral body height loss at T7 and T8 and moderate height loss at T12. CT ABDOMEN PELVIS FINDINGS Hepatobiliary: No focal liver abnormality is seen. No gallstones, gallbladder wall thickening, or biliary dilatation. Pancreas: Unremarkable. Spleen: Unremarkable. Adrenals/Urinary Tract: Unremarkable adrenal glands. No evidence of renal mass, calculi, or hydronephrosis. Unremarkable bladder. Stomach/Bowel: The  stomach is moderately distended by fluid and is otherwise unremarkable. There is no evidence of bowel obstruction or inflammation. The appendix is unremarkable. Vascular/Lymphatic: Normal caliber of the abdominal aorta. No enlarged lymph nodes. Reproductive: Grossly unremarkable uterus and ovaries. Other: No ascites. Musculoskeletal: Widespread lytic bone lesions throughout the spine and pelvis with the lumbar spine evaluated in detail on the separate dedicated spine CT. IMPRESSION: 1. Widespread lytic lesions throughout the visualized skeleton consistent with metastatic disease or multiple myeloma. No definite primary malignancy identified in the chest, abdomen, or pelvis. 2. Nonspecific pulmonary ground-glass opacities in the left greater than right upper lobes, likely infectious or inflammatory. Electronically Signed   By: Logan Bores M.D.   On: 04/02/2020 13:12    Labs:  CBC: Recent Labs    04/07/20 0606 04/08/20 0533 04/09/20 0539 04/15/20 1112  WBC 6.3 6.6 6.4 3.6*  HGB 9.5* 9.5* 9.0* 9.0*  HCT 29.1* 29.9* 27.3* 28.3*  PLT 254 216 255 142*    COAGS: No results for input(s): INR, APTT in the last 8760 hours.  BMP: Recent Labs    04/07/20 0606 04/08/20 0531 04/09/20 0539 04/15/20 1112  NA 132* 133* 131* 134*  K 3.4* 3.6 3.9 3.9  CL 104 106 104 107  CO2 22 19* 19* 22  GLUCOSE 94 93 145* 132*  BUN 46* 41* 37* 18  CALCIUM 9.3 9.3 8.5* 8.1*  CREATININE 1.22* 1.11* 0.98 0.80  GFRNONAA 57* >60 >60 >  60    LIVER FUNCTION TESTS: Recent Labs    04/05/20 0503 04/06/20 0607 04/07/20 0606 04/15/20 1112  BILITOT 0.5 0.6 0.7 0.3  AST 17 11* 13* 10*  ALT 26 26 31 27   ALKPHOS 240* 266* 327* 325*  PROT 9.1* 8.8* 8.7* 7.7  ALBUMIN 2.6* 2.5* 2.6* 2.6*    TUMOR MARKERS: No results for input(s): AFPTM, CEA, CA199, CHROMGRNA in the last 8760 hours.  Assessment and Plan:  My impression is that this patient has pathologic compression fractures of thoracic T12 and lumbar L4,  however her pain in these regions has responded nicely to radiation therapy.  Her main complaint is left hip pain secondary to neoplastic involvement of that region.  I do not think her back pain warrants additional invasive treatment unless there is some exacerbation from her current status.  We talked about vertebral plasty/kyphoplasty and osteocool ablation, anticipated benefits, possible risks and complications, and alternatives.  She was in agreement with holding off more invasive treatments at this point.  She knows to call should she have any exacerbation of pain or reconsider and think that additional percutaneous intervention may be symptomatically indicated.  Thank you for this interesting consult.  I greatly enjoyed meeting Stacey Montoya and look forward to participating in their care.  A copy of this report was sent to the requesting provider on this date.  Electronically Signed: Rickard Rhymes 04/23/2020, 1:07 PM   I spent a total of  30 Minutes   in remote  clinical consultation, greater than 50% of which was counseling/coordinating care for pathologic compression fractures of T12 and L4.Marland Kitchen    Visit type: Audio only (telephone). Audio (no video) only due to patient's lack of internet/smartphone capability. Alternative for in-person consultation at [facility], [address] [place], [place], [place]. This visit type was conducted due to national recommendations for restrictions regarding the COVID-19 Pandemic (e.g. social distancing).  This format is felt to be most appropriate for this patient at this time.  All issues noted in this document were discussed and addressed.

## 2020-04-24 ENCOUNTER — Other Ambulatory Visit: Payer: Self-pay

## 2020-04-24 ENCOUNTER — Ambulatory Visit (HOSPITAL_COMMUNITY): Admission: RE | Admit: 2020-04-24 | Payer: BC Managed Care – PPO | Source: Ambulatory Visit

## 2020-04-24 ENCOUNTER — Ambulatory Visit (HOSPITAL_COMMUNITY)
Admission: RE | Admit: 2020-04-24 | Discharge: 2020-04-24 | Disposition: A | Discharge status: Home / Self Care | Payer: BC Managed Care – PPO | Source: Ambulatory Visit | Attending: Oncology | Admitting: Oncology

## 2020-04-24 DIAGNOSIS — C9000 Multiple myeloma not having achieved remission: Secondary | ICD-10-CM | POA: Insufficient documentation

## 2020-04-24 LAB — GLUCOSE, CAPILLARY: Glucose-Capillary: 92 mg/dL (ref 70–99)

## 2020-04-24 MED ORDER — FLUDEOXYGLUCOSE F - 18 (FDG) INJECTION
10.0000 | Freq: Once | INTRAVENOUS | Status: AC | PRN
  Administered 2020-04-24: 9.8 via INTRAVENOUS

## 2020-04-25 ENCOUNTER — Telehealth: Payer: Self-pay | Admitting: *Deleted

## 2020-04-25 ENCOUNTER — Other Ambulatory Visit: Payer: Self-pay | Admitting: Hematology

## 2020-04-25 NOTE — Telephone Encounter (Signed)
Patient called - Her throat is raw and scratchy feeling, hurts really bad when she swallows. Her ears - mostly on right-are feeling congested. No cough. No fever. She said it doesn't feel like she's getting sick. Patient also states she is having increased femur pain and needs refill of oxycodone. She saw Dr. Mylo Red at [facility] and Dr. Mylo Red has declined to place a rod for now - will reevaluate in 6-8 weeks.  Dr.Kale informed of information and these symptoms.  Contacted patient with Dr. Grier Mitts response: he refilled her oxycodone. Also recommended salt and baking soda (1cup warm water + 1/2 teaspoons baking soda + 1/4 teaspoon salt) mouth washes 4-5 X daily, steam inhalation (distilled water), OTC CLaritin for possible allergies. Patient verbalized understanding of directions. Dr. Irene Limbo also said to inform patient her M protein has already come down from 4.1 to 1.8. Patient verbalized understanding of information.

## 2020-04-28 NOTE — Progress Notes (Signed)
HEMATOLOGY/ONCOLOGY CLINIC NOTE  Date of Service: 04/29/2020   Patient Care Team: Celene Squibb, MD as PCP - General (Internal Medicine) Corrie Mckusick, DO as Consulting Physician (Interventional Radiology)  CHIEF COMPLAINTS/PURPOSE OF CONSULTATION:  Multiple Myeloma- continued mx  HISTORY OF PRESENTING ILLNESS:   Stacey Montoya is a wonderful 45 y.o. female who is here today for evaluation and management of newly diagnosed Myeloma. The patient's last visit with [place] was inpatient on 04/15/2020. The pt reports that she is doing well overall. We are joined today by her husband. She is here for C2D1 CyBorD.  The pt reports that she has been retaining fluid all in her lower extremities, from her feet to her hips. The pt also notes her throat has ben very sore and is affecting how much she can drink and eat each day. The pt notes she talked to the Interventional Radiologist, who told her that due to no significant pain in back they will not do any action at this time. The discomfort in her back and pain down leg has improved, but depends on the activity she performs. This is now intermittent, not continuous as with prior visit. The pt has bene using her walker and been able to move around the house more. She notes going up the stairs is still worse than down. The pt notes the last two days she has not felt well and has been more bedbound.  The pt notes that she is nearing her menstrual period. These can be very heavy with clotting and last up to 2 weeks. This just depends on the time.   Lab results today 04/29/2020 of CBC w/diff and CMP is as follows: all values are WNL except for WBC of 2.1K, RBC of 2.46, Hgb of 8.0, HCT of 25.2, MCV of 102.4, RDW of 18.9, Plt of 88K, Neutro Abs of 1.6K, Lymphs Abs of 0.2K, Glucose of 115, Calcium of 7.3, Total Protein of 4.9, Albumin of 2.4, AST of 12, Alkaline Phosphatase of 189. 04/29/2020 MMP - M spike is down to 0.3g/dl 04/29/2020 Light Chains and K/L  WNL  On review of systems, pt reports fluid retention, throat soreness and denies fevers, white coating on tongue, dizziness, lightheadedness, abdominal pain, back pain, and any other symptoms.  MEDICAL HISTORY:  Past Medical History:  Diagnosis Date  . Abnormal Pap smear of cervix   . Back pain   . Hyperlipidemia     SURGICAL HISTORY: Past Surgical History:  Procedure Laterality Date  . CERVICAL CONIZATION W/BX  12/02/2010   Procedure: CONIZATION CERVIX WITH BIOPSY;  Surgeon: Arloa Koh;  Location: [facility];  Service: Gynecology;  Laterality: N/A;  COLD KNIFE  . CESAREAN SECTION    . DILATION AND CURETTAGE OF UTERUS  12/02/2010   Procedure: DILATATION AND CURETTAGE (D&C);  Surgeon: Arloa Koh;  Location: [facility];  Service: Gynecology;  Laterality: N/A;  . IR RADIOLOGIST EVAL & MGMT  04/16/2020  . left hand     drain - infection  . WISDOM TOOTH EXTRACTION      SOCIAL HISTORY: Social History   Socioeconomic History  . Marital status: Married    Spouse name: Not on file  . Number of children: Not on file  . Years of education: Not on file  . Highest education level: Not on file  Occupational History  . Not on file  Tobacco Use  . Smoking status: Current Every Day Smoker    Packs/day: 0.50  Years: 20.00    Pack years: 10.00    Types: Cigarettes  . Smokeless tobacco: Never Used  Vaping Use  . Vaping Use: Every day  Substance and Sexual Activity  . Alcohol use: Not Currently    Comment: socially - 6 beers per month  . Drug use: Not Currently  . Sexual activity: Yes    Partners: Male    Birth control/protection: Condom  Other Topics Concern  . Not on file  Social History Narrative   ** Merged History Encounter **       Social Determinants of Health   Financial Resource Strain: Not on file  Food Insecurity: Not on file  Transportation Needs: Not on file  Physical Activity: Not on file  Stress: Not on file  Social Connections: Not on file  Intimate  Partner Violence: Not on file    FAMILY HISTORY: Family History  Problem Relation Age of Onset  . Lung cancer Maternal Grandfather   . Pancreatic cancer Paternal Grandfather     ALLERGIES:  is allergic to zithromax [azithromycin].  MEDICATIONS:  Current Outpatient Medications  Medication Sig Dispense Refill  . furosemide (LASIX) 20 MG tablet Take 1 tablet (20 mg total) by mouth daily. 30 tablet 1  . acyclovir (ZOVIRAX) 400 MG tablet Take 1 tablet (400 mg total) by mouth 2 (two) times daily. 60 tablet 3  . albuterol (VENTOLIN HFA) 108 (90 Base) MCG/ACT inhaler Inhale 1-2 puffs into the lungs as needed for wheezing.    Marland Kitchen amLODipine (NORVASC) 5 MG tablet Take 1 tablet (5 mg total) by mouth daily. 90 tablet 0  . cholecalciferol (VITAMIN D) 25 MCG tablet Take 2 tablets (2,000 Units total) by mouth daily. 180 tablet 0  . dexamethasone (DECADRON) 4 MG tablet Take 1 tablet (4 mg total) by mouth daily. 30 tablet 0  . ergocalciferol (VITAMIN D2) 1.25 MG (50000 UT) capsule Take 1 capsule (50,000 Units total) by mouth once a week. 12 capsule 2  . fentaNYL (DURAGESIC) 25 MCG/HR Place 1 patch onto the skin every 3 (three) days. 10 patch 0  . gabapentin (NEURONTIN) 300 MG capsule Take 1 capsule (300 mg total) by mouth 2 (two) times daily. 180 capsule 0  . LORazepam (ATIVAN) 0.5 MG tablet Take 1 tablet (0.5 mg total) by mouth every 6 (six) hours as needed (Nausea or vomiting). 30 tablet 0  . metoprolol tartrate (LOPRESSOR) 50 MG tablet Take 1 tablet (50 mg total) by mouth 2 (two) times daily. 180 tablet 0  . nicotine (NICODERM CQ - DOSED IN MG/24 HOURS) 21 mg/24hr patch Place 1 patch (21 mg total) onto the skin daily. 28 patch 0  . norethindrone (MICRONOR) 0.35 MG tablet Take 1 tablet by mouth daily.    . norethindrone (MICRONOR,CAMILA,ERRIN) 0.35 MG tablet Take 1 tablet by mouth daily.      . ondansetron (ZOFRAN) 4 MG tablet Take 4 mg by mouth every 8 (eight) hours.    . ondansetron (ZOFRAN) 8 MG  tablet Take 1 tablet (8 mg total) by mouth 2 (two) times daily as needed for refractory nausea / vomiting. Start on day 3 after Cytoxan. 30 tablet 1  . oxyCODONE (OXY IR/ROXICODONE) 5 MG immediate release tablet TAKE 1 TO 2 TABLETS BY MOUTH EVERY 6 HOURS AS NEEDED. 60 tablet 0  . polyethylene glycol (MIRALAX / GLYCOLAX) 17 g packet Take 17 g by mouth daily. 14 each 0  . prochlorperazine (COMPAZINE) 10 MG tablet Take 1 tablet (10 mg total) by  mouth every 6 (six) hours as needed (Nausea or vomiting). 30 tablet 1   No current facility-administered medications for this visit.    REVIEW OF SYSTEMS:   10 Point review of Systems was done is negative except as noted above.  PHYSICAL EXAMINATION: ECOG PERFORMANCE STATUS: 2 - Symptomatic, <50% confined to bed  Vitals:   04/29/20 1234  BP: (!) 101/58  Pulse: 85  Resp: 16  Temp: 97.7 F (36.5 C)  SpO2: 100%   Filed Weights   04/29/20 1234  Weight: 241 lb 9.6 oz (109.6 kg)   .Body mass index is 41.47 kg/m.  Exam was given in a wheelchair.   GENERAL:alert, in no acute distress and comfortable SKIN: no acute rashes, no significant lesions EYES: conjunctiva are pink and non-injected, sclera anicteric OROPHARYNX: MMM, no exudates, no oropharyngeal erythema or ulceration NECK: supple, no JVD LYMPH:  no palpable lymphadenopathy in the cervical, axillary or inguinal regions LUNGS: clear to auscultation b/l with normal respiratory effort HEART: regular rate & rhythm ABDOMEN:  normoactive bowel sounds , non tender, not distended. Extremity: no pedal edema PSYCH: alert & oriented x 3 with fluent speech NEURO: no focal motor/sensory deficits  LABORATORY DATA:  I have reviewed the data as listed  . CBC Latest Ref Rng & Units 04/29/2020 04/15/2020 04/09/2020  WBC 4.0 - 10.5 K/uL 2.1(L) 3.6(L) 6.4  Hemoglobin 12.0 - 15.0 g/dL 8.0(L) 9.0(L) 9.0(L)  Hematocrit 36.0 - 46.0 % 25.2(L) 28.3(L) 27.3(L)  Platelets 150 - 400 K/uL 88(L) 142(L) 255    ANC1.6k  . CMP Latest Ref Rng & Units 04/29/2020 04/15/2020 04/09/2020  Glucose 70 - 99 mg/dL 115(H) 132(H) 145(H)  BUN 6 - 20 mg/dL 13 18 37(H)  Creatinine 0.44 - 1.00 mg/dL 0.66 0.80 0.98  Sodium 135 - 145 mmol/L 137 134(L) 131(L)  Potassium 3.5 - 5.1 mmol/L 4.1 3.9 3.9  Chloride 98 - 111 mmol/L 108 107 104  CO2 22 - 32 mmol/L 23 22 19(L)  Calcium 8.9 - 10.3 mg/dL 7.3(L) 8.1(L) 8.5(L)  Total Protein 6.5 - 8.1 g/dL 4.9(L) 7.7 -  Total Bilirubin 0.3 - 1.2 mg/dL 0.5 0.3 -  Alkaline Phos 38 - 126 U/L 189(H) 325(H) -  AST 15 - 41 U/L 12(L) 10(L) -  ALT 0 - 44 U/L 21 27 -     RADIOGRAPHIC STUDIES: I have personally reviewed the radiological images as listed and agreed with the findings in the report. DG Chest 2 View  Result Date: 04/02/2020 CLINICAL DATA:  45 year old female with tachycardia and low grade fever. EXAM: CHEST - 2 VIEW COMPARISON:  03/10/2018 FINDINGS: The heart size and mediastinal contours are within normal limits. Both lungs are clear. The visualized skeletal structures are unremarkable. IMPRESSION: No acute cardiopulmonary process. Electronically Signed   By: Ruthann Cancer MD   On: 04/02/2020 09:09   NM PET Image Initial (PI) Whole Body  Result Date: 04/24/2020 CLINICAL DATA:  Subsequent treatment strategy for multiple myeloma. EXAM: NUCLEAR MEDICINE PET WHOLE BODY TECHNIQUE: 9.8 mCi F-18 FDG was injected intravenously. Full-ring PET imaging was performed from the head to foot after the radiotracer. CT data was obtained and used for attenuation correction and anatomic localization. Fasting blood glucose: 92 mg/dl COMPARISON:  CT 04/02/2018 FINDINGS: Mediastinal blood pool activity: SUV max 2.2 HEAD/NECK: No hypermetabolic activity in the scalp. No hypermetabolic cervical lymph nodes. Incidental CT findings: none CHEST: No hypermetabolic mediastinal or hilar nodes. No suspicious pulmonary nodules on the CT scan. Incidental CT findings: none ABDOMEN/PELVIS:  No abnormal  hypermetabolic activity within the liver, pancreas, adrenal glands, or spleen. No hypermetabolic lymph nodes in the abdomen or pelvis. Incidental CT findings: Uterus and adnexa unremarkable. SKELETON: No hypermetabolic lesions within the axillary or appendicular skeleton. There is expansile lytic change on the CT portion exam at multiple sites without metabolic activity. For example prominent lytic expansile lesions in the LEFT RIGHT scapula. The upper ribs posteriorly are markedly expanded. For Example the posterior RIGHT second rib measures 2.3 cm sec thickness with no metabolic activity. Likewise there multiple lytic lesions throughout the spine without focal metabolic activity multiple lytic lesions in the pelvis and proximal femurs. Incidental CT findings: Multiple lytic lesions described. EXTREMITIES: No lytic change within thea distal extremities. No abnormal metabolic activity Incidental CT findings: As above IMPRESSION: 1. No evidence of active multiple myeloma by FDG PET imaging. 2. Multiple skeletal lesions which are expansile and lytic involving the ribs, scapula, spine, pelvis and proximal femurs. No associated metabolic activity consistent with dormant/remission multiple myeloma. 3. No evidence of soft tissue plasmacytoma Electronically Signed   By: Suzy Bouchard M.D.   On: 04/24/2020 15:50   CT L-SPINE NO CHARGE  Result Date: 04/02/2020 CLINICAL DATA:  Back pain.  Pathologic fracture with hypercalcemia. EXAM: CT LUMBAR SPINE WITHOUT CONTRAST TECHNIQUE: Multidetector CT imaging of the lumbar spine was performed without intravenous contrast administration. Multiplanar CT image reconstructions were also generated. COMPARISON:  Thoracolumbar spine radiographs 03/07/2020 FINDINGS: Segmentation: 5 lumbar type vertebrae. Alignment: Straightening of the normal lumbar lordosis. No significant listhesis. Vertebrae: Widespread lytic bone lesions throughout the lumbar spine as well as included lower  thoracic spine and pelvis. Pathologic T12 compression fracture with 50% vertebral body height loss, progressed from the prior radiographs. New pathologic L4 fracture involving the vertebral body and pedicles with 85% vertebral body height loss centrally and 6 mm retropulsion of the posterior vertebral cortex. Large lesion involving the left pedicle, pars, and transverse process of L1 with possible nondisplaced pathologic fracture. Paraspinal and other soft tissues: No acute paraspinal soft tissue abnormality. Intra-abdominal and pelvic contents reported separately. 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 IMPRESSION: 1. Widespread lytic bone lesions consistent with metastatic disease. 2. Pathologic T12 and L4 vertebral fractures as above. Possible nondisplaced pathologic fracture of the left L1 posterior elements. 3. 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 Electronically Signed   By: Logan Bores M.D.   On: 04/02/2020 12:52   DG CHEST PORT 1 VIEW  Result Date: 04/04/2020 CLINICAL DATA:  Multiple myeloma EXAM: PORTABLE CHEST 1 VIEW COMPARISON:  Portable exam 1812 hours compared to 04/02/2020 FINDINGS: Stable heart size and pulmonary vascularity. Prominent soft tissue at RIGHT mediastinal border just above the RIGHT hilum, increased in prominence since the previous exam, corresponding to paraspinal mass on prior CT study. Scattered interstitial infiltrates are seen in both lungs which could represent atypical infection or edema. No pleural effusion or pneumothorax. Bones demineralized. Bone destruction of the posterior RIGHT third rib identified. IMPRESSION: Scattered interstitial infiltrates, question edema versus atypical infection. Electronically Signed   By: Lavonia Dana M.D.   On: 04/04/2020 18:35   DG Bone Survey Met  Result Date: 04/02/2020 CLINICAL DATA:  Lytic lesion on x-ray.  Possible multiple myeloma. EXAM: METASTATIC BONE SURVEY  COMPARISON:  CT of same day. FINDINGS: Ill-defined lucencies are noted throughout the skull which may represent lytic lesions. Lucencies are noted in the bilateral humeri. Lytic lesions are seen involving  the right third and fourth ribs. Probable lytic lesion is seen involving the left fourth rib. Lytic lesions are noted in the left scapula. Fractures are seen involving the T12 and L4 vertebral bodies consistent with pathologic fractures. Lucencies are seen throughout the pelvis and visualized proximal femurs consistent with lytic lesions. IMPRESSION: Multiple lytic lesions are noted consistent with multiple myeloma or metastatic disease. Pathologic fractures are seen involving the T12 and L4 vertebral bodies. Electronically Signed   By: Marijo Conception M.D.   On: 04/02/2020 15:29   CT BONE MARROW BIOPSY & ASPIRATION  Result Date: 04/03/2020 INDICATION: 45 year old female with newly diagnosed multiple myeloma. She presents for CT-guided bone marrow biopsy to assess for marrow involvement. EXAM: CT GUIDED BONE MARROW ASPIRATION AND CORE BIOPSY Interventional Radiologist:  Criselda Peaches, MD MEDICATIONS: None. ANESTHESIA/SEDATION: Moderate (conscious) sedation was employed during this procedure. A total of 4 milligrams versed and 150 micrograms fentanyl were administered intravenously. The patient's level of consciousness and vital signs were monitored continuously by radiology nursing throughout the procedure under my direct supervision. Total monitored sedation time: 18 minutes FLUOROSCOPY TIME:  None COMPLICATIONS: None immediate. Estimated blood loss: <25 mL PROCEDURE: Informed written consent was obtained from the patient after a thorough discussion of the procedural risks, benefits and alternatives. All questions were addressed. Maximal Sterile Barrier Technique was utilized including caps, mask, sterile gowns, sterile gloves, sterile drape, hand hygiene and skin antiseptic. A timeout was performed  prior to the initiation of the procedure. The patient was positioned prone and non-contrast localization CT was performed of the pelvis to demonstrate the iliac marrow spaces. Maximal barrier sterile technique utilized including caps, mask, sterile gowns, sterile gloves, large sterile drape, hand hygiene, and betadine prep. Under sterile conditions and local anesthesia, an 11 gauge coaxial bone biopsy needle was advanced into the right iliac marrow space. Needle position was confirmed with CT imaging. Initially, bone marrow aspiration was performed. Next, the 11 gauge outer cannula was utilized to obtain a right iliac bone marrow core biopsy. Needle was removed. Hemostasis was obtained with compression. The patient tolerated the procedure well. Samples were prepared with the cytotechnologist. IMPRESSION: Technically successful CT-guided bone marrow aspiration and core biopsy of the right iliac bone. Electronically Signed   By: Jacqulynn Cadet M.D.   On: 04/03/2020 19:33   ECHOCARDIOGRAM COMPLETE  Result Date: 04/06/2020    ECHOCARDIOGRAM REPORT   Patient Name:   KAHMARI HERARD Burkard Date of Exam: 04/06/2020 Medical Rec #:  [id number]         Height:       64.0 in Accession #:    [id number]        Weight:       235.7 lb Date of Birth:  16-Nov-1975         BSA:          2.098 m Patient Age:    32 years          BP:           188/99 mmHg Patient Gender: F                 HR:           99 bpm. Exam Location:  Inpatient Procedure: 2D Echo, Cardiac Doppler and Color Doppler Indications:    Elevated Troponin  History:        Patient has no prior history of Echocardiogram examinations.  Sonographer:    Bernadene Person RDCS Referring Phys: [id number]  CAROLE N HALL IMPRESSIONS  1. Left ventricular ejection fraction, by estimation, is 60 to 65%. The left ventricle has normal function. The left ventricle has no regional wall motion abnormalities. Left ventricular diastolic parameters are consistent with Grade I diastolic  dysfunction (impaired relaxation). Elevated left ventricular end-diastolic pressure.  2. Right ventricular systolic function is normal. The right ventricular size is normal. There is mildly elevated pulmonary artery systolic pressure. The estimated right ventricular systolic pressure is 14.9 mmHg.  3. The mitral valve is normal in structure. Mild mitral valve regurgitation. No evidence of mitral stenosis.  4. The aortic valve is normal in structure. Aortic valve regurgitation is not visualized. No aortic stenosis is present.  5. The inferior vena cava is normal in size with greater than 50% respiratory variability, suggesting right atrial pressure of 3 mmHg. FINDINGS  Left Ventricle: Left ventricular ejection fraction, by estimation, is 60 to 65%. The left ventricle has normal function. The left ventricle has no regional wall motion abnormalities. The left ventricular internal cavity size was normal in size. There is  no left ventricular hypertrophy. Left ventricular diastolic parameters are consistent with Grade I diastolic dysfunction (impaired relaxation). Elevated left ventricular end-diastolic pressure. Right Ventricle: The right ventricular size is normal. No increase in right ventricular wall thickness. Right ventricular systolic function is normal. There is mildly elevated pulmonary artery systolic pressure. The tricuspid regurgitant velocity is 2.88  m/s, and with an assumed right atrial pressure of 3 mmHg, the estimated right ventricular systolic pressure is 70.2 mmHg. Left Atrium: Left atrial size was normal in size. Right Atrium: Right atrial size was normal in size. Pericardium: There is no evidence of pericardial effusion. Mitral Valve: The mitral valve is normal in structure. There is mild thickening of the mitral valve leaflet(s). Mild mitral valve regurgitation. No evidence of mitral valve stenosis. Tricuspid Valve: The tricuspid valve is normal in structure. Tricuspid valve regurgitation is mild .  No evidence of tricuspid stenosis. Aortic Valve: The aortic valve is normal in structure. Aortic valve regurgitation is not visualized. No aortic stenosis is present. Pulmonic Valve: The pulmonic valve was normal in structure. Pulmonic valve regurgitation is not visualized. No evidence of pulmonic stenosis. Aorta: The aortic root is normal in size and structure. Venous: The inferior vena cava is normal in size with greater than 50% respiratory variability, suggesting right atrial pressure of 3 mmHg. IAS/Shunts: No atrial level shunt detected by color flow Doppler.  LEFT VENTRICLE PLAX 2D LVIDd:         5.08 cm  Diastology LVIDs:         3.00 cm  LV e' medial:    0.06 cm/s LV PW:         0.96 cm  LV E/e' medial:  17.5 LV IVS:        0.87 cm  LV e' lateral:   0.08 cm/s LVOT diam:     2.20 cm  LV E/e' lateral: 13.0 LV SV:         81 LV SV Index:   38 LVOT Area:     3.80 cm  RIGHT VENTRICLE RV S prime:     9.11 cm/s TAPSE (M-mode): 2.0 cm LEFT ATRIUM         Index LA diam:    3.30 cm 1.57 cm/m  AORTIC VALVE LVOT Vmax:   124.00 cm/s LVOT Vmean:  85.100 cm/s LVOT VTI:    0.212 m  AORTA Ao Root diam: 3.30 cm Ao Asc diam:  3.20 cm MITRAL VALVE                TRICUSPID VALVE MV Area (PHT): 2.60 cm     TR Peak grad:   33.2 mmHg MV Decel Time: 292 msec     TR Vmax:        288.00 cm/s MV E velocity: 0.98 cm/s MV A velocity: 102.00 cm/s  SHUNTS MV E/A ratio:  0.01         Systemic VTI:  0.21 m                             Systemic Diam: 2.20 cm Ena Dawley MD Electronically signed by Ena Dawley MD Signature Date/Time: 04/06/2020/1:18:07 PM    Final    DG FEMUR PORT MIN 2 VIEWS LEFT  Result Date: 04/02/2020 CLINICAL DATA:  Bilateral thigh pain.  History of myeloma. EXAM: LEFT FEMUR PORTABLE 2 VIEWS COMPARISON:  Femur exam on bone survey earlier today. Included portion from CT earlier today. FINDINGS: Lucent lesion in the lesser trochanter was better appreciated on CT earlier today. There are multiple small lucencies  involving the femoral head and intertrochanteric femur that are not well seen by radiograph. No obvious destructive lesion in the femoral shaft. No evidence of pathologic fracture. IMPRESSION: Proximal femoral lesions including a lesion of the lesser trochanter, majority of these are better seen on CT earlier today. There is no evidence of acute femur fracture or large destructive lesion. Consider MRI for more detailed assessment. Electronically Signed   By: Keith Rake M.D.   On: 04/02/2020 21:21   DG FEMUR PORT, MIN 2 VIEWS RIGHT  Result Date: 04/02/2020 CLINICAL DATA:  Right thigh pain.  Myeloma. EXAM: RIGHT FEMUR PORTABLE 2 VIEW COMPARISON:  Included portion from bone survey earlier today. Included portion from pelvis CT earlier today. FINDINGS: Lucent lesion in the intertrochanteric femur on CT earlier today is only faintly visualized, this is at risk for pathologic fracture. There is some thinning of the lateral cortex of the greater trochanter. There are multiple additional small lucencies in the femoral head on CT that are not well seen by radiograph. Small lesion noted in the distal lateral aspect of the femur in the region of the physeal scar. No evidence of acute fracture. IMPRESSION: Relatively large lucent lesion in the intertrochanteric femur on CT earlier today is only faintly visualized, this is at risk for pathologic fracture. There are multiple additional lucencies in the femoral head on CT are not well seen by radiograph. There is a small lesion in the distal lateral femoral metaphysis. MRI may be of value for more detailed assessment. Electronically Signed   By: Keith Rake M.D.   On: 04/02/2020 21:23   CT CHEST ABDOMEN PELVIS WO CONTRAST  Result Date: 04/02/2020 CLINICAL DATA:  Thoracic and lumbar spine pain. Pathologic fracture with hypercalcemia. EXAM: CT CHEST, ABDOMEN AND PELVIS WITHOUT CONTRAST TECHNIQUE: Multidetector CT imaging of the chest, abdomen and pelvis was  performed following the standard protocol without IV contrast. COMPARISON:  Chest radiographs 04/02/2020. Thoracolumbar spine radiographs 03/07/2020. FINDINGS: CT CHEST FINDINGS Cardiovascular: Normal caliber of the thoracic aorta. Normal heart size. No pericardial effusion. Mediastinum/Nodes: No enlarged axillary, mediastinal, or hilar lymph nodes are identified with hilar assessment limited in the absence of IV contrast. Unremarkable visualized thyroid and esophagus. Lungs/Pleura: No pleural effusion or pneumothorax. Patchy and hazy ground-glass opacities in the left greater than right upper lobes. No parenchymal  lung mass. Musculoskeletal: Widespread lytic bone lesions throughout the visualized skeleton including a destructive 5 cm lesion involving the posterior right third rib and a 3 cm destructive lesion involving the posteromedial right sixth rib. Smaller destructive lesions with nondisplaced pathologic fractures of the posterolateral right fourth and posterior left fourth ribs. Large lesion involving much of the T1 vertebral body with disruption of the posterior vertebral body cortex but no gross epidural tumor. Pathologic vertebral body fractures with mild vertebral body height loss at T7 and T8 and moderate height loss at T12. CT ABDOMEN PELVIS FINDINGS Hepatobiliary: No focal liver abnormality is seen. No gallstones, gallbladder wall thickening, or biliary dilatation. Pancreas: Unremarkable. Spleen: Unremarkable. Adrenals/Urinary Tract: Unremarkable adrenal glands. No evidence of renal mass, calculi, or hydronephrosis. Unremarkable bladder. Stomach/Bowel: The stomach is moderately distended by fluid and is otherwise unremarkable. There is no evidence of bowel obstruction or inflammation. The appendix is unremarkable. Vascular/Lymphatic: Normal caliber of the abdominal aorta. No enlarged lymph nodes. Reproductive: Grossly unremarkable uterus and ovaries. Other: No ascites. Musculoskeletal: Widespread lytic  bone lesions throughout the spine and pelvis with the lumbar spine evaluated in detail on the separate dedicated spine CT. IMPRESSION: 1. Widespread lytic lesions throughout the visualized skeleton consistent with metastatic disease or multiple myeloma. No definite primary malignancy identified in the chest, abdomen, or pelvis. 2. Nonspecific pulmonary ground-glass opacities in the left greater than right upper lobes, likely infectious or inflammatory. Electronically Signed   By: Logan Bores M.D.   On: 04/02/2020 13:12   IR Radiologist Eval & Mgmt  Result Date: 04/23/2020 Please refer to notes tab for details about interventional procedure. 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 3) anemia due to multiple myeloma becoming more apparent as the patient's hemoconcentration due to dehydration has been corrected.   4) acute renal failure related to dehydration hypercalcemia and multiple myeloma.  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 -Discussed pt last MMP, 04/15/2020; m protein decreased from 4.1 to 1.8. Sent out again today to observe response after 1 full cycle. -Discussed pt NM PET Whole Body on  04/24/2020; reviewed imaged and findings with the pt. -Discussed pt molecular testing 04/03/2020; showed translocation 4;14 mutations present in 39% of cells. This would be considered high-risk. No active lesions.  -Discussed switching treatment if needed.  - Will continue with current treatment at this time with dose adjustments sicne she has had excellent treatment response thus far. -Advised pt the mutation profile allows [place] to know what more aggressive treatment we will switch to if/when needed. -Advised pt that the anemia could be playing role in her feeling increasingly worse the last two days.  -Discussed balance of dosage in treatment. Blood counts will fluctuate during this time. -Hgb of 8.0 today. No need for transfusion at this time, but will continue  to monitor. Will schedule a transfusion for C2D4 and pt is aware she can cancel if better. -Will decrease dosage of Cytoxan at this time for C2 @ 200 mg /m2 -Continue salt and baking soda mouth washes for throat. -Advised pt we will have to decrease steroid doses and increase Lasix.  -Advised pt to decrease steroid dosage on D15 from 40 mg to 20 mg.  -Recommended pt wear compression socks for swelling and keep legs elevated while sitting down and laying down.  -Recommended pt continue to eat calcium-rich foods.  -Recommended pt increase protein intake in diet. Recommended protein shake. -Advised pt to switch Ergocalciferol from once weekly to twice weekly. -Advised pt Ibuprofen use prn is okay at this time, but not on standard or chronic basis. -Recommended pt increase potassium intake while on Lasix. Suggested orange juice and bananas. -Rx Magic mouthwash for throat and swallowing pain for use prn before meals. No obvious signs of thrush upon examination. -Rx Lasix. -Will decrease Cytoxan dosage to 200 mg/m^2. -Will see back on C2D11 in ten days with labs.   FOLLOW UP: Please schedule 1 unit of PRBC transfusion with C2D4 in 3  days Please add MD visit with cycle 2-day 11 Please schedule cycle 3 as per orders.  Labs on day 1 and day 8 of cycle. MD visit on cycle 3-day 1   All of the patients questions were answered with apparent satisfaction. The patient knows to call the clinic with any problems, questions or concerns.  The total time spent in the appointment was 30 minutes and more than 50% was on counseling and direct patient cares.     Sullivan Lone MD [place] AAHIVMS [facility] Hematology/Oncology Physician [facility]  (Office):       [phone] (Work cell):  [phone] (Fax):           [phone]  04/29/2020 1:11 PM  I, Reinaldo Raddle, am acting as scribe for Dr. Sullivan Lone, MD.   .I have reviewed the above documentation for accuracy and completeness, and I agree with the above. Brunetta Genera MD

## 2020-04-29 ENCOUNTER — Other Ambulatory Visit: Payer: Self-pay | Admitting: *Deleted

## 2020-04-29 ENCOUNTER — Inpatient Hospital Stay: Payer: BC Managed Care – PPO

## 2020-04-29 ENCOUNTER — Inpatient Hospital Stay (HOSPITAL_BASED_OUTPATIENT_CLINIC_OR_DEPARTMENT_OTHER): Payer: BC Managed Care – PPO | Admitting: Hematology

## 2020-04-29 ENCOUNTER — Other Ambulatory Visit: Payer: Self-pay

## 2020-04-29 VITALS — BP 101/58 | HR 85 | Temp 97.7°F | Resp 16 | Ht 64.0 in | Wt 241.6 lb

## 2020-04-29 DIAGNOSIS — Z5112 Encounter for antineoplastic immunotherapy: Secondary | ICD-10-CM | POA: Diagnosis not present

## 2020-04-29 DIAGNOSIS — D630 Anemia in neoplastic disease: Secondary | ICD-10-CM | POA: Diagnosis not present

## 2020-04-29 DIAGNOSIS — C7951 Secondary malignant neoplasm of bone: Secondary | ICD-10-CM

## 2020-04-29 DIAGNOSIS — R609 Edema, unspecified: Secondary | ICD-10-CM

## 2020-04-29 DIAGNOSIS — Z5111 Encounter for antineoplastic chemotherapy: Secondary | ICD-10-CM

## 2020-04-29 DIAGNOSIS — C9000 Multiple myeloma not having achieved remission: Secondary | ICD-10-CM

## 2020-04-29 DIAGNOSIS — G893 Neoplasm related pain (acute) (chronic): Secondary | ICD-10-CM

## 2020-04-29 DIAGNOSIS — Z79899 Other long term (current) drug therapy: Secondary | ICD-10-CM | POA: Diagnosis not present

## 2020-04-29 DIAGNOSIS — Z7189 Other specified counseling: Secondary | ICD-10-CM

## 2020-04-29 LAB — CMP (CANCER CENTER ONLY)
ALT: 21 U/L (ref 0–44)
AST: 12 U/L — ABNORMAL LOW (ref 15–41)
Albumin: 2.4 g/dL — ABNORMAL LOW (ref 3.5–5.0)
Alkaline Phosphatase: 189 U/L — ABNORMAL HIGH (ref 38–126)
Anion gap: 6 (ref 5–15)
BUN: 13 mg/dL (ref 6–20)
CO2: 23 mmol/L (ref 22–32)
Calcium: 7.3 mg/dL — ABNORMAL LOW (ref 8.9–10.3)
Chloride: 108 mmol/L (ref 98–111)
Creatinine: 0.66 mg/dL (ref 0.44–1.00)
GFR, Estimated: >60 mL/min (ref >60)
Glucose, Bld: 115 mg/dL — ABNORMAL HIGH (ref 70–99)
Potassium: 4.1 mmol/L (ref 3.5–5.1)
Sodium: 137 mmol/L (ref 135–145)
Total Bilirubin: 0.5 mg/dL (ref 0.3–1.2)
Total Protein: 4.9 g/dL — ABNORMAL LOW (ref 6.5–8.1)

## 2020-04-29 LAB — CBC WITH DIFFERENTIAL (CANCER CENTER ONLY)
Abs Immature Granulocytes: 0.02 10*3/uL (ref 0.00–0.07)
Basophils Absolute: 0 10*3/uL (ref 0.0–0.1)
Basophils Relative: 1 %
Eosinophils Absolute: 0 10*3/uL (ref 0.0–0.5)
Eosinophils Relative: 1 %
HCT: 25.2 % — ABNORMAL LOW (ref 36.0–46.0)
Hemoglobin: 8 g/dL — ABNORMAL LOW (ref 12.0–15.0)
Immature Granulocytes: 1 %
Lymphocytes Relative: 11 %
Lymphs Abs: 0.2 10*3/uL — ABNORMAL LOW (ref 0.7–4.0)
MCH: 32.5 pg (ref 26.0–34.0)
MCHC: 31.7 g/dL (ref 30.0–36.0)
MCV: 102.4 fL — ABNORMAL HIGH (ref 80.0–100.0)
Monocytes Absolute: 0.3 10*3/uL (ref 0.1–1.0)
Monocytes Relative: 13 %
Neutro Abs: 1.6 10*3/uL — ABNORMAL LOW (ref 1.7–7.7)
Neutrophils Relative %: 73 %
Platelet Count: 88 10*3/uL — ABNORMAL LOW (ref 150–400)
RBC: 2.46 MIL/uL — ABNORMAL LOW (ref 3.87–5.11)
RDW: 18.9 % — ABNORMAL HIGH (ref 11.5–15.5)
WBC Count: 2.1 10*3/uL — ABNORMAL LOW (ref 4.0–10.5)
nRBC: 0 % (ref 0.0–0.2)

## 2020-04-29 LAB — SAMPLE TO BLOOD BANK

## 2020-04-29 LAB — PREPARE RBC (CROSSMATCH)

## 2020-04-29 MED ORDER — MAGIC MOUTHWASH W/LIDOCAINE: ORAL | 1 refills | Status: DC

## 2020-04-29 MED ORDER — SODIUM CHLORIDE 0.9 % IV SOLN
200.0000 mg/m2 | Freq: Once | INTRAVENOUS | Status: AC
  Administered 2020-04-29: 440 mg via INTRAVENOUS
  Filled 2020-04-29: qty 22

## 2020-04-29 MED ORDER — ZOLEDRONIC ACID 4 MG/100ML IV SOLN
INTRAVENOUS | Status: AC
  Filled 2020-04-29: qty 100

## 2020-04-29 MED ORDER — SODIUM CHLORIDE 0.9 % IV SOLN
40.0000 mg | Freq: Once | INTRAVENOUS | Status: AC
  Administered 2020-04-29: 40 mg via INTRAVENOUS
  Filled 2020-04-29: qty 4

## 2020-04-29 MED ORDER — ZOLEDRONIC ACID 4 MG/100ML IV SOLN
4.0000 mg | Freq: Once | INTRAVENOUS | Status: AC
  Administered 2020-04-29: 4 mg via INTRAVENOUS

## 2020-04-29 MED ORDER — FUROSEMIDE 20 MG PO TABS: 20.0000 mg | ORAL_TABLET | Freq: Every day | ORAL | 1 refills | Status: DC

## 2020-04-29 MED ORDER — BORTEZOMIB CHEMO SQ INJECTION 3.5 MG (2.5MG/ML)
1.3000 mg/m2 | Freq: Once | INTRAMUSCULAR | Status: AC
  Administered 2020-04-29: 2.75 mg via SUBCUTANEOUS
  Filled 2020-04-29: qty 1.1

## 2020-04-29 MED ORDER — SODIUM CHLORIDE 0.9 % IV SOLN
Freq: Once | INTRAVENOUS | Status: AC
Start: 2020-04-29 — End: 2020-04-29
  Filled 2020-04-29: qty 250

## 2020-04-29 MED ORDER — SODIUM CHLORIDE 0.9 % IV SOLN
Freq: Once | INTRAVENOUS | Status: DC
  Filled 2020-04-29: qty 250

## 2020-04-29 MED ORDER — PALONOSETRON HCL INJECTION 0.25 MG/5ML
INTRAVENOUS | Status: AC
  Filled 2020-04-29: qty 5

## 2020-04-29 MED ORDER — PALONOSETRON HCL INJECTION 0.25 MG/5ML
0.2500 mg | Freq: Once | INTRAVENOUS | Status: AC
  Administered 2020-04-29: 0.25 mg via INTRAVENOUS

## 2020-04-29 NOTE — Patient Instructions (Addendum)
[facility] Discharge Instructions for Patients Receiving Chemotherapy  Today you received the following chemotherapy agents Velcade, Cytoxan, and Zometa  To help prevent nausea and vomiting after your treatment, we encourage you to take your nausea medication as directed. If you develop nausea and vomiting that is not controlled by your nausea medication, call the clinic.   BELOW ARE SYMPTOMS THAT SHOULD BE REPORTED IMMEDIATELY:  *FEVER GREATER THAN 100.5 F  *CHILLS WITH OR WITHOUT FEVER  NAUSEA AND VOMITING THAT IS NOT CONTROLLED WITH YOUR NAUSEA MEDICATION  *UNUSUAL SHORTNESS OF BREATH  *UNUSUAL BRUISING OR BLEEDING  TENDERNESS IN MOUTH AND THROAT WITH OR WITHOUT PRESENCE OF ULCERS  *URINARY PROBLEMS  *BOWEL PROBLEMS  UNUSUAL RASH Items with * indicate a potential emergency and should be followed up as soon as possible.  Feel free to call the clinic should you have any questions or concerns. The clinic phone number is [phone].  Please show the [place] at check-in to the Emergency Department and triage nurse. Zoledronic Acid Injection (Hypercalcemia, Oncology) What is this medicine? ZOLEDRONIC ACID (ZOE le dron ik AS id) slows calcium loss from bones. It high calcium levels in the blood from some kinds of cancer. It may be used in other people at risk for bone loss. This medicine may be used for other purposes; ask your health care provider or pharmacist if you have questions. COMMON BRAND NAME(S): Zometa What should I tell my health care provider before I take this medicine? They need to know if you have any of these conditions:  cancer  dehydration  dental disease  kidney disease  liver disease  low levels of calcium in the blood  lung or breathing disease (asthma)  receiving steroids like dexamethasone or prednisone  an unusual or allergic reaction to zoledronic acid, other medicines, foods, dyes, or preservatives  pregnant  or trying to get pregnant  breast-feeding How should I use this medicine? This drug is injected into a vein. It is given by a health care provider in a hospital or clinic setting. Talk to your health care provider about the use of this drug in children. Special care may be needed. Overdosage: If you think you have taken too much of this medicine contact a poison control center or emergency room at once. NOTE: This medicine is only for you. Do not share this medicine with others. What if I miss a dose? Keep appointments for follow-up doses. It is important not to miss your dose. Call your health care provider if you are unable to keep an appointment. What may interact with this medicine?  certain antibiotics given by injection  NSAIDs, medicines for pain and inflammation, like ibuprofen or naproxen  some diuretics like bumetanide, furosemide  teriparatide  thalidomide This list may not describe all possible interactions. Give your health care provider a list of all the medicines, herbs, non-prescription drugs, or dietary supplements you use. Also tell them if you smoke, drink alcohol, or use illegal drugs. Some items may interact with your medicine. What should I watch for while using this medicine? Visit your health care provider for regular checks on your progress. It may be some time before you see the benefit from this drug. Some people who take this drug have severe bone, joint, or muscle pain. This drug may also increase your risk for jaw problems or a broken thigh bone. Tell your health care provider right away if you have severe pain in your jaw, bones, joints,  or muscles. Tell you health care provider if you have any pain that does not go away or that gets worse. Tell your dentist and dental surgeon that you are taking this drug. You should not have major dental surgery while on this drug. See your dentist to have a dental exam and fix any dental problems before starting this drug.  Take good care of your teeth while on this drug. Make sure you see your dentist for regular follow-up appointments. You should make sure you get enough calcium and vitamin D while you are taking this drug. Discuss the foods you eat and the vitamins you take with your health care provider. Check with your health care provider if you have severe diarrhea, nausea, and vomiting, or if you sweat a lot. The loss of too much body fluid may make it dangerous for you to take this drug. You may need blood work done while you are taking this drug. Do not become pregnant while taking this drug. Women should inform their health care provider if they wish to become pregnant or think they might be pregnant. There is potential for serious harm to an unborn child. Talk to your health care provider for more information. What side effects may I notice from receiving this medicine? Side effects that you should report to your doctor or health care provider as soon as possible:  allergic reactions (skin rash, itching or hives; swelling of the face, lips, or tongue)  bone pain  infection (fever, chills, cough, sore throat, pain or trouble passing urine)  jaw pain, especially after dental work  joint pain  kidney injury (trouble passing urine or change in the amount of urine)  low blood pressure (dizziness; feeling faint or lightheaded, falls; unusually weak or tired)  low calcium levels (fast heartbeat; muscle cramps or pain; pain, tingling, or numbness in the hands or feet; seizures)  low magnesium levels (fast, irregular heartbeat; muscle cramp or pain; muscle weakness; tremors; seizures)  low red blood cell counts (trouble breathing; feeling faint; lightheaded, falls; unusually weak or tired)  muscle pain  redness, blistering, peeling, or loosening of the skin, including inside the mouth  severe diarrhea  swelling of the ankles, feet, hands  trouble breathing Side effects that usually do not require  medical attention (report to your doctor or health care provider if they continue or are bothersome):  anxious  constipation  coughing  depressed mood  eye irritation, itching, or pain  fever  general ill feeling or flu-like symptoms  nausea  pain, redness, or irritation at site where injected  trouble sleeping This list may not describe all possible side effects. Call your doctor for medical advice about side effects. You may report side effects to FDA at 1-800-FDA-1088. Where should I keep my medicine? This drug is given in a hospital or clinic. It will not be stored at home. NOTE: This sheet is a summary. It may not cover all possible information. If you have questions about this medicine, talk to your doctor, pharmacist, or health care provider.  2021 Elsevier/Gold Standard (2018-11-17 09:13:00)    Per Dr. Irene Limbo take two TUMS when you get home.

## 2020-04-29 NOTE — Telephone Encounter (Signed)
Patient requested lasix sent to [facility]. Patient called and asked Lasix be sent to [facility]. Prescription sent to [facility] as requested. Contacted [facility] to advise they will not need to fill prescription. Confirmed with [facility].

## 2020-04-29 NOTE — Progress Notes (Signed)
Verbal order/Dr. Irene Limbo: Ok to receive Cytoxan  with PLT 88 and Ok to receive Zometa with Calcium 7.3. Per Dr. Irene Limbo - give patient 2 TUMS.

## 2020-04-29 NOTE — Progress Notes (Signed)
Verbal order from Dr. Irene Limbo - Transfuse 1 unit PRBCs with C2, D4 treatment.  Premeds: Tylenol 650 mg PO, Solumedrol 40 mg IV Confirmed orders with Beth at [facility]. Beth states they received orders T&S and prepare for transfusion on 3/17

## 2020-04-30 ENCOUNTER — Telehealth: Payer: Self-pay | Admitting: Hematology

## 2020-04-30 LAB — KAPPA/LAMBDA LIGHT CHAINS
Kappa free light chain: 9.1 mg/L (ref 3.3–19.4)
Kappa, lambda light chain ratio: 1.4 (ref 0.26–1.65)
Lambda free light chains: 6.5 mg/L (ref 5.7–26.3)

## 2020-04-30 NOTE — Telephone Encounter (Signed)
Scheduled follow-up appointments per 3/14 los. Patient is aware. ?

## 2020-05-02 ENCOUNTER — Other Ambulatory Visit: Payer: Self-pay

## 2020-05-02 ENCOUNTER — Other Ambulatory Visit: Payer: Self-pay | Admitting: *Deleted

## 2020-05-02 ENCOUNTER — Inpatient Hospital Stay: Payer: BC Managed Care – PPO

## 2020-05-02 VITALS — BP 101/47 | HR 83 | Temp 98.8°F | Resp 18

## 2020-05-02 DIAGNOSIS — C7951 Secondary malignant neoplasm of bone: Secondary | ICD-10-CM

## 2020-05-02 DIAGNOSIS — D630 Anemia in neoplastic disease: Secondary | ICD-10-CM | POA: Diagnosis not present

## 2020-05-02 DIAGNOSIS — Z5112 Encounter for antineoplastic immunotherapy: Secondary | ICD-10-CM | POA: Diagnosis not present

## 2020-05-02 DIAGNOSIS — C9000 Multiple myeloma not having achieved remission: Secondary | ICD-10-CM

## 2020-05-02 DIAGNOSIS — Z79899 Other long term (current) drug therapy: Secondary | ICD-10-CM | POA: Diagnosis not present

## 2020-05-02 DIAGNOSIS — Z7189 Other specified counseling: Secondary | ICD-10-CM

## 2020-05-02 LAB — MULTIPLE MYELOMA PANEL, SERUM
Albumin SerPl Elph-Mcnc: 2.3 g/dL — ABNORMAL LOW (ref 2.9–4.4)
Albumin/Glob SerPl: 1.2 (ref 0.7–1.7)
Alpha 1: 0.3 g/dL (ref 0.0–0.4)
Alpha2 Glob SerPl Elph-Mcnc: 0.4 g/dL (ref 0.4–1.0)
B-Globulin SerPl Elph-Mcnc: 0.8 g/dL (ref 0.7–1.3)
Gamma Glob SerPl Elph-Mcnc: 0.5 g/dL (ref 0.4–1.8)
Globulin, Total: 2 g/dL — ABNORMAL LOW (ref 2.2–3.9)
IgA: 31 mg/dL — ABNORMAL LOW (ref 87–352)
IgG (Immunoglobin G), Serum: 581 mg/dL — ABNORMAL LOW (ref 586–1602)
IgM (Immunoglobulin M), Srm: 9 mg/dL — ABNORMAL LOW (ref 26–217)
M Protein SerPl Elph-Mcnc: 0.3 g/dL — ABNORMAL HIGH
Total Protein ELP: 4.3 g/dL — ABNORMAL LOW (ref 6.0–8.5)

## 2020-05-02 MED ORDER — ACETAMINOPHEN 325 MG PO TABS
ORAL_TABLET | ORAL | Status: AC
  Filled 2020-05-02: qty 2

## 2020-05-02 MED ORDER — BORTEZOMIB CHEMO SQ INJECTION 3.5 MG (2.5MG/ML)
1.3000 mg/m2 | Freq: Once | INTRAMUSCULAR | Status: AC
  Administered 2020-05-02: 2.75 mg via SUBCUTANEOUS
  Filled 2020-05-02: qty 1.1

## 2020-05-02 MED ORDER — PROCHLORPERAZINE MALEATE 10 MG PO TABS
ORAL_TABLET | ORAL | Status: AC
  Filled 2020-05-02: qty 1

## 2020-05-02 MED ORDER — SODIUM CHLORIDE 0.9% IV SOLUTION
250.0000 mL | Freq: Once | INTRAVENOUS | Status: AC
  Administered 2020-05-02: 250 mL via INTRAVENOUS
  Filled 2020-05-02: qty 250

## 2020-05-02 MED ORDER — METHYLPREDNISOLONE SODIUM SUCC 40 MG IJ SOLR
INTRAMUSCULAR | Status: AC
  Filled 2020-05-02: qty 1

## 2020-05-02 MED ORDER — PROCHLORPERAZINE MALEATE 10 MG PO TABS
10.0000 mg | ORAL_TABLET | Freq: Once | ORAL | Status: AC
  Administered 2020-05-02: 10 mg via ORAL

## 2020-05-02 MED ORDER — METHYLPREDNISOLONE SODIUM SUCC 40 MG IJ SOLR
40.0000 mg | Freq: Once | INTRAMUSCULAR | Status: AC
  Administered 2020-05-02: 40 mg via INTRAVENOUS

## 2020-05-02 MED ORDER — ACETAMINOPHEN 325 MG PO TABS
650.0000 mg | ORAL_TABLET | Freq: Once | ORAL | Status: AC
  Administered 2020-05-02: 650 mg via ORAL

## 2020-05-02 NOTE — Patient Instructions (Signed)
[facility] Discharge Instructions for Patients Receiving Chemotherapy  Today you received the following chemotherapy agents: bortezomib.  To help prevent nausea and vomiting after your treatment, we encourage you to take your nausea medication as directed.   If you develop nausea and vomiting that is not controlled by your nausea medication, call the clinic.   BELOW ARE SYMPTOMS THAT SHOULD BE REPORTED IMMEDIATELY:  *FEVER GREATER THAN 100.5 F  *CHILLS WITH OR WITHOUT FEVER  NAUSEA AND VOMITING THAT IS NOT CONTROLLED WITH YOUR NAUSEA MEDICATION  *UNUSUAL SHORTNESS OF BREATH  *UNUSUAL BRUISING OR BLEEDING  TENDERNESS IN MOUTH AND THROAT WITH OR WITHOUT PRESENCE OF ULCERS  *URINARY PROBLEMS  *BOWEL PROBLEMS  UNUSUAL RASH Items with * indicate a potential emergency and should be followed up as soon as possible.  Feel free to call the clinic should you have any questions or concerns. The clinic phone number is [phone].  Please show the CHEMO ALERT CARD at check-in to the Emergency Department and triage nurse.   

## 2020-05-02 NOTE — Patient Instructions (Signed)

## 2020-05-03 ENCOUNTER — Other Ambulatory Visit: Payer: Self-pay | Admitting: Hematology

## 2020-05-03 ENCOUNTER — Telehealth: Payer: Self-pay

## 2020-05-03 DIAGNOSIS — C9000 Multiple myeloma not having achieved remission: Secondary | ICD-10-CM

## 2020-05-03 DIAGNOSIS — Z7189 Other specified counseling: Secondary | ICD-10-CM

## 2020-05-03 LAB — TYPE AND SCREEN
ABO/RH(D): A POS
Antibody Screen: NEGATIVE
Unit division: 0

## 2020-05-03 LAB — BPAM RBC
Blood Product Expiration Date: 202204092359
ISSUE DATE / TIME: 202203171049
Unit Type and Rh: 6200

## 2020-05-03 NOTE — Telephone Encounter (Signed)
Please review for refill, Thank you. 

## 2020-05-03 NOTE — Telephone Encounter (Signed)
Received call from patient to refill Lorazepam and Oxycodone.  Request forwarded to MD.

## 2020-05-06 ENCOUNTER — Inpatient Hospital Stay: Payer: BC Managed Care – PPO

## 2020-05-06 ENCOUNTER — Other Ambulatory Visit: Payer: Self-pay

## 2020-05-06 VITALS — BP 117/71 | HR 93 | Temp 98.6°F | Resp 18 | Wt 241.8 lb

## 2020-05-06 DIAGNOSIS — C9000 Multiple myeloma not having achieved remission: Secondary | ICD-10-CM

## 2020-05-06 DIAGNOSIS — Z5112 Encounter for antineoplastic immunotherapy: Secondary | ICD-10-CM | POA: Diagnosis not present

## 2020-05-06 DIAGNOSIS — D630 Anemia in neoplastic disease: Secondary | ICD-10-CM | POA: Diagnosis not present

## 2020-05-06 DIAGNOSIS — Z79899 Other long term (current) drug therapy: Secondary | ICD-10-CM | POA: Diagnosis not present

## 2020-05-06 DIAGNOSIS — Z7189 Other specified counseling: Secondary | ICD-10-CM

## 2020-05-06 DIAGNOSIS — C7951 Secondary malignant neoplasm of bone: Secondary | ICD-10-CM

## 2020-05-06 LAB — CBC WITH DIFFERENTIAL (CANCER CENTER ONLY)
Abs Immature Granulocytes: 0.06 10*3/uL (ref 0.00–0.07)
Basophils Absolute: 0 10*3/uL (ref 0.0–0.1)
Basophils Relative: 0 %
Eosinophils Absolute: 0 10*3/uL (ref 0.0–0.5)
Eosinophils Relative: 1 %
HCT: 26.4 % — ABNORMAL LOW (ref 36.0–46.0)
Hemoglobin: 8.3 g/dL — ABNORMAL LOW (ref 12.0–15.0)
Immature Granulocytes: 2 %
Lymphocytes Relative: 4 %
Lymphs Abs: 0.2 10*3/uL — ABNORMAL LOW (ref 0.7–4.0)
MCH: 32.3 pg (ref 26.0–34.0)
MCHC: 31.4 g/dL (ref 30.0–36.0)
MCV: 102.7 fL — ABNORMAL HIGH (ref 80.0–100.0)
Monocytes Absolute: 0.2 10*3/uL (ref 0.1–1.0)
Monocytes Relative: 6 %
Neutro Abs: 3.5 10*3/uL (ref 1.7–7.7)
Neutrophils Relative %: 87 %
Platelet Count: 63 10*3/uL — ABNORMAL LOW (ref 150–400)
RBC: 2.57 MIL/uL — ABNORMAL LOW (ref 3.87–5.11)
RDW: 19.7 % — ABNORMAL HIGH (ref 11.5–15.5)
WBC Count: 3.9 10*3/uL — ABNORMAL LOW (ref 4.0–10.5)
nRBC: 0.5 % — ABNORMAL HIGH (ref 0.0–0.2)

## 2020-05-06 LAB — CMP (CANCER CENTER ONLY)
ALT: 22 U/L (ref 0–44)
AST: 11 U/L — ABNORMAL LOW (ref 15–41)
Albumin: 2.8 g/dL — ABNORMAL LOW (ref 3.5–5.0)
Alkaline Phosphatase: 147 U/L — ABNORMAL HIGH (ref 38–126)
Anion gap: 7 (ref 5–15)
BUN: 11 mg/dL (ref 6–20)
CO2: 25 mmol/L (ref 22–32)
Calcium: 7.9 mg/dL — ABNORMAL LOW (ref 8.9–10.3)
Chloride: 104 mmol/L (ref 98–111)
Creatinine: 0.68 mg/dL (ref 0.44–1.00)
GFR, Estimated: >60 mL/min (ref >60)
Glucose, Bld: 123 mg/dL — ABNORMAL HIGH (ref 70–99)
Potassium: 3.6 mmol/L (ref 3.5–5.1)
Sodium: 136 mmol/L (ref 135–145)
Total Bilirubin: 0.7 mg/dL (ref 0.3–1.2)
Total Protein: 5.5 g/dL — ABNORMAL LOW (ref 6.5–8.1)

## 2020-05-06 LAB — SAMPLE TO BLOOD BANK

## 2020-05-06 MED ORDER — PALONOSETRON HCL INJECTION 0.25 MG/5ML
INTRAVENOUS | Status: AC
  Filled 2020-05-06: qty 5

## 2020-05-06 MED ORDER — BORTEZOMIB CHEMO SQ INJECTION 3.5 MG (2.5MG/ML)
1.3000 mg/m2 | Freq: Once | INTRAMUSCULAR | Status: AC
  Administered 2020-05-06: 2.75 mg via SUBCUTANEOUS
  Filled 2020-05-06: qty 1.1

## 2020-05-06 MED ORDER — SODIUM CHLORIDE 0.9 % IV SOLN
40.0000 mg | Freq: Once | INTRAVENOUS | Status: AC
  Administered 2020-05-06: 40 mg via INTRAVENOUS
  Filled 2020-05-06: qty 4

## 2020-05-06 MED ORDER — SODIUM CHLORIDE 0.9 % IV SOLN
200.0000 mg/m2 | Freq: Once | INTRAVENOUS | Status: AC
  Administered 2020-05-06: 440 mg via INTRAVENOUS
  Filled 2020-05-06: qty 22

## 2020-05-06 MED ORDER — SODIUM CHLORIDE 0.9 % IV SOLN
Freq: Once | INTRAVENOUS | Status: AC
  Filled 2020-05-06: qty 250

## 2020-05-06 MED ORDER — PALONOSETRON HCL INJECTION 0.25 MG/5ML: 0.2500 mg | Freq: Once | INTRAVENOUS | Status: DC

## 2020-05-06 NOTE — Patient Instructions (Signed)
[place] Discharge Instructions for Patients Receiving Chemotherapy  Today you received the following chemotherapy agents Bortezimib(Velcade), Cyclophosfamide(cytoxan)  To help prevent nausea and vomiting after your treatment, we encourage you to take your nausea medication as directed.  If you develop nausea and vomiting that is not controlled by your nausea medication, call the clinic.   BELOW ARE SYMPTOMS THAT SHOULD BE REPORTED IMMEDIATELY:  *FEVER GREATER THAN 100.5 F  *CHILLS WITH OR WITHOUT FEVER  NAUSEA AND VOMITING THAT IS NOT CONTROLLED WITH YOUR NAUSEA MEDICATION  *UNUSUAL SHORTNESS OF BREATH  *UNUSUAL BRUISING OR BLEEDING  TENDERNESS IN MOUTH AND THROAT WITH OR WITHOUT PRESENCE OF ULCERS  *URINARY PROBLEMS  *BOWEL PROBLEMS  UNUSUAL RASH Items with * indicate a potential emergency and should be followed up as soon as possible.  Feel free to call the clinic should you have any questions or concerns. The clinic phone number is [phone].  Please show the [place] at check-in to the Emergency Department and triage nurse.

## 2020-05-06 NOTE — Progress Notes (Signed)
MD states okay to treat with PLT 63

## 2020-05-08 NOTE — Progress Notes (Signed)
HEMATOLOGY/ONCOLOGY CLINIC NOTE  Date of Service: 05/09/2020  Patient Care Team: Celene Squibb, MD as PCP - General (Internal Medicine) Corrie Mckusick, DO as Consulting Physician (Interventional Radiology)  CHIEF COMPLAINTS/PURPOSE OF CONSULTATION:  Multiple Myeloma- continued mx  HISTORY OF PRESENTING ILLNESS:   Stacey Montoya is a wonderful 45 y.o. female who is here today for evaluation and management of newly diagnosed Myeloma. The patient's last visit with [place] was inpatient on 04/29/2020. The pt reports that she is doing well overall. We are joined today by her husband. She is here for C2D11 CyBorD.  The pt reports that her leg swelling has improved in her feet and ankles, but she is still swollen in her knees and thighs. The pt notes her back pain has worsened recently, due to increased activity. The pt notes she cannot sleep at night even on the Ativan, from waking up to sleep and waking up in pain. The pain is deep in her hip joint primarily, also shooting down her leg while showering, going to bathroom, and stairs. The back pain is constant. The pt notes that she is needing to take the Oxycodone four times daily due to being in extreme pain. The pt notes she is taking the Miralax to help with bowel movements.  Lab results 05/06/2020 of CBC w/diff and CMP is as follows: all values are WNL except for WBC of 3.9K, RBC of 2.57, Hgb of 8.3, HCT of 26.4, MCV of 102.7, RDW of 19.7, Plt of 63K, nRBC of 0.5, Glucose of 123, Calcium of 7.9, Total Protein of 5.5, Albumin of 2.8, AST of 11, Alkaline Phosphatase of 147.  On review of systems, pt reports back pain, hip pain, difficulty sleeping, leg swelling and denies SOB, upper back pain, abdominal pain, changes in bowel habits, and any other symptoms.  MEDICAL HISTORY:  Past Medical History:  Diagnosis Date  . Abnormal Pap smear of cervix   . Back pain   . Hyperlipidemia     SURGICAL HISTORY: Past Surgical History:  Procedure  Laterality Date  . CERVICAL CONIZATION W/BX  12/02/2010   Procedure: CONIZATION CERVIX WITH BIOPSY;  Surgeon: Arloa Koh;  Location: [facility];  Service: Gynecology;  Laterality: N/A;  COLD KNIFE  . CESAREAN SECTION    . DILATION AND CURETTAGE OF UTERUS  12/02/2010   Procedure: DILATATION AND CURETTAGE (D&C);  Surgeon: Arloa Koh;  Location: [facility];  Service: Gynecology;  Laterality: N/A;  . IR RADIOLOGIST EVAL & MGMT  04/16/2020  . left hand     drain - infection  . WISDOM TOOTH EXTRACTION      SOCIAL HISTORY: Social History   Socioeconomic History  . Marital status: Married    Spouse name: Not on file  . Number of children: Not on file  . Years of education: Not on file  . Highest education level: Not on file  Occupational History  . Not on file  Tobacco Use  . Smoking status: Current Every Day Smoker    Packs/day: 0.50    Years: 20.00    Pack years: 10.00    Types: Cigarettes  . Smokeless tobacco: Never Used  Vaping Use  . Vaping Use: Every day  Substance and Sexual Activity  . Alcohol use: Not Currently    Comment: socially - 6 beers per month  . Drug use: Not Currently  . Sexual activity: Yes    Partners: Male    Birth control/protection: Condom  Other Topics Concern  .  Not on file  Social History Narrative   ** Merged History Encounter **       Social Determinants of Health   Financial Resource Strain: Not on file  Food Insecurity: Not on file  Transportation Needs: Not on file  Physical Activity: Not on file  Stress: Not on file  Social Connections: Not on file  Intimate Partner Violence: Not on file    FAMILY HISTORY: Family History  Problem Relation Age of Onset  . Lung cancer Maternal Grandfather   . Pancreatic cancer Paternal Grandfather     ALLERGIES:  is allergic to zithromax [azithromycin].  MEDICATIONS:  Current Outpatient Medications  Medication Sig Dispense Refill  . acyclovir (ZOVIRAX) 400 MG tablet Take 1 tablet (400 mg total) by  mouth 2 (two) times daily. 60 tablet 3  . albuterol (VENTOLIN HFA) 108 (90 Base) MCG/ACT inhaler Inhale 1-2 puffs into the lungs as needed for wheezing.    Marland Kitchen amLODipine (NORVASC) 5 MG tablet Take 1 tablet (5 mg total) by mouth daily. 90 tablet 0  . cholecalciferol (VITAMIN D) 25 MCG tablet Take 2 tablets (2,000 Units total) by mouth daily. 180 tablet 0  . dexamethasone (DECADRON) 4 MG tablet Take 1 tablet (4 mg total) by mouth daily. 30 tablet 0  . ergocalciferol (VITAMIN D2) 1.25 MG (50000 UT) capsule Take 1 capsule (50,000 Units total) by mouth once a week. 12 capsule 2  . fentaNYL (DURAGESIC) 25 MCG/HR Place 1 patch onto the skin every 3 (three) days. 10 patch 0  . furosemide (LASIX) 20 MG tablet Take 1 tablet (20 mg total) by mouth daily. 30 tablet 1  . gabapentin (NEURONTIN) 300 MG capsule Take 1 capsule (300 mg total) by mouth 2 (two) times daily. 180 capsule 0  . LORazepam (ATIVAN) 0.5 MG tablet TAKE (1) TABLET BY MOUTH EVERY SIX HOURS AS NEEDED. 30 tablet 0  . magic mouthwash w/lidocaine SOLN Dispense 488m of magic mouthwash compounded preparation. Patient to use 527m4 times daily as needed for throat discomfort. 80 ml viscous lidocaine 2% 80 ml Mylanta 80 ml diphenhydramine at 12.5 mg per 5 ml elixir 80 ml nystatin at 100,000U per 5 mL suspension 80 ml distilled water 400 mL 1  . medroxyPROGESTERone (PROVERA) 10 MG tablet daily.    . metoprolol tartrate (LOPRESSOR) 50 MG tablet Take 1 tablet (50 mg total) by mouth 2 (two) times daily. 180 tablet 0  . nicotine (NICODERM CQ - DOSED IN MG/24 HOURS) 21 mg/24hr patch Place 1 patch (21 mg total) onto the skin daily. 28 patch 0  . norethindrone (MICRONOR) 0.35 MG tablet Take 1 tablet by mouth daily.    . norethindrone (MICRONOR,CAMILA,ERRIN) 0.35 MG tablet Take 1 tablet by mouth daily.      . ondansetron (ZOFRAN) 4 MG tablet Take 4 mg by mouth every 8 (eight) hours.    . ondansetron (ZOFRAN) 8 MG tablet Take 1 tablet (8 mg total) by mouth 2  (two) times daily as needed for refractory nausea / vomiting. Start on day 3 after Cytoxan. 30 tablet 1  . oxyCODONE (OXY IR/ROXICODONE) 5 MG immediate release tablet TAKE 1 TO 2 TABLETS BY MOUTH EVERY 6 HOURS AS NEEDED. 60 tablet 0  . polyethylene glycol (MIRALAX / GLYCOLAX) 17 g packet Take 17 g by mouth daily. 14 each 0  . prochlorperazine (COMPAZINE) 10 MG tablet Take 1 tablet (10 mg total) by mouth every 6 (six) hours as needed (Nausea or vomiting). 30 tablet 1  No current facility-administered medications for this visit.    REVIEW OF SYSTEMS:   10 Point review of Systems was done is negative except as noted above.  PHYSICAL EXAMINATION: ECOG PERFORMANCE STATUS: 2 - Symptomatic, <50% confined to bed  Vitals:   05/09/20 0900  BP: (!) 109/47  Pulse: 89  Resp: 18  Temp: (!) 97 F (36.1 C)  SpO2: 99%   Filed Weights   05/09/20 0900  Weight: 241 lb 14.4 oz (109.7 kg)   .Body mass index is 41.52 kg/m.  Exam was given in a wheelchair.  GENERAL:alert, in no acute distress and comfortable SKIN: no acute rashes, no significant lesions EYES: conjunctiva are pink and non-injected, sclera anicteric OROPHARYNX: MMM, no exudates, no oropharyngeal erythema or ulceration NECK: supple, no JVD LYMPH:  no palpable lymphadenopathy in the cervical, axillary or inguinal regions LUNGS: clear to auscultation b/l with normal respiratory effort HEART: regular rate & rhythm ABDOMEN:  normoactive bowel sounds , non tender, not distended. Extremity: no pedal edema. 1+ leg swelling b/l. PSYCH: alert & oriented x 3 with fluent speech NEURO: no focal motor/sensory deficits   LABORATORY DATA:  I have reviewed the data as listed  . CBC Latest Ref Rng & Units 05/06/2020 04/29/2020 04/15/2020  WBC 4.0 - 10.5 K/uL 3.9(L) 2.1(L) 3.6(L)  Hemoglobin 12.0 - 15.0 g/dL 8.3(L) 8.0(L) 9.0(L)  Hematocrit 36.0 - 46.0 % 26.4(L) 25.2(L) 28.3(L)  Platelets 150 - 400 K/uL 63(L) 88(L) 142(L)    ANC1.6k  . CMP Latest Ref Rng & Units 05/06/2020 04/29/2020 04/15/2020  Glucose 70 - 99 mg/dL 123(H) 115(H) 132(H)  BUN 6 - 20 mg/dL _0 Creatinine 0.44 - 1.00 mg/dL 0.68 0.66 0.80  Sodium 135 - 145 mmol/L 136 137 134(L)  Potassium 3.5 - 5.1 mmol/L 3.6 4.1 3.9  Chloride 98 - 111 mmol/L 104 108 107  CO2 22 - 32 mmol/L _1 Calcium 8.9 - 10.3 mg/dL 7.9(L) 7.3(L) 8.1(L)  Total Protein 6.5 - 8.1 g/dL 5.5(L) 4.9(L) 7.7  Total Bilirubin 0.3 - 1.2 mg/dL 0.7 0.5 0.3  Alkaline Phos 38 - 126 U/L 147(H) 189(H) 325(H)  AST 15 - 41 U/L 11(L) 12(L) 10(L)  ALT 0 - 44 U/L _2 RADIOGRAPHIC STUDIES: I have personally reviewed the radiological images as listed and agreed with the findings in the report. NM PET Image Initial (PI) Whole Body  Result Date: 04/24/2020 CLINICAL DATA:  Subsequent treatment strategy for multiple myeloma. EXAM: NUCLEAR MEDICINE PET WHOLE BODY TECHNIQUE: 9.8 mCi F-18 FDG was injected intravenously. Full-ring PET imaging was performed from the head to foot after the radiotracer. CT data was obtained and used for attenuation correction and anatomic localization. Fasting blood glucose: 92 mg/dl COMPARISON:  CT 04/02/2018 FINDINGS: Mediastinal blood pool activity: SUV max 2.2 HEAD/NECK: No hypermetabolic activity in the scalp. No hypermetabolic cervical lymph nodes. Incidental CT findings: none CHEST: No hypermetabolic mediastinal or hilar nodes. No suspicious pulmonary nodules on the CT scan. Incidental CT findings: none ABDOMEN/PELVIS: No abnormal hypermetabolic activity within the liver, pancreas, adrenal glands, or spleen. No hypermetabolic lymph nodes in the abdomen or pelvis. Incidental CT findings: Uterus and adnexa unremarkable. SKELETON: No hypermetabolic lesions within the axillary or appendicular skeleton. There is expansile lytic change on the CT portion exam at multiple sites without metabolic activity. For example prominent lytic expansile lesions in the  LEFT RIGHT scapula. The upper ribs posteriorly are markedly expanded. For Example the posterior RIGHT second  rib measures 2.3 cm sec thickness with no metabolic activity. Likewise there multiple lytic lesions throughout the spine without focal metabolic activity multiple lytic lesions in the pelvis and proximal femurs. Incidental CT findings: Multiple lytic lesions described. EXTREMITIES: No lytic change within thea distal extremities. No abnormal metabolic activity Incidental CT findings: As above IMPRESSION: 1. No evidence of active multiple myeloma by FDG PET imaging. 2. Multiple skeletal lesions which are expansile and lytic involving the ribs, scapula, spine, pelvis and proximal femurs. No associated metabolic activity consistent with dormant/remission multiple myeloma. 3. No evidence of soft tissue plasmacytoma Electronically Signed   By: Suzy Bouchard M.D.   On: 04/24/2020 15:50   IR Radiologist Eval & Mgmt  Result Date: 04/23/2020 Please refer to notes tab for details about interventional procedure. 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 3) anemia due to multiple myeloma becoming more apparent as the patient's hemoconcentration due to dehydration has been corrected.   4) acute renal failure related to dehydration hypercalcemia and multiple myeloma.  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 -Discussed pt MMP 04/29/2020; m protein down to 0.3 and pt is already in VGPR after 1.5 cycles. -Recommended pt check her weight daily to observe fluid retention.  -Continue salt and baking soda mouth washes for throat.  -Recommended pt wear back brace and avoid bending and twisting. -Will give early referral to [facility] for transplant. Will still complete at least four cycles. Advised this would be standard consolidation regiment, but not mandatory if pt chooses not to undergo this. -Recommended pt be careful of sun as Cytoxan increases sun sensitivity. -Advised pt to avoid stress to the hips and lower back. -Recommended pt wear compression socks for swelling and keep legs elevated while sitting down and laying down.  -Recommended pt increase protein intake in diet. Recommended protein shake. -Continue Ergocalciferol twice weekly. -Recommended pt increase potassium intake while on Lasix. Suggested orange juice and bananas. -Will increase Lasix from 20 mg to 40 mg. Take with morning and lunch. -Will keep Cytoxan dosage at 200 mg/m^2. -Will see back with C3D4 in two weeks. -Refill Fentanyl.   FOLLOW UP: Please schedule cycle 3 and cycle 4 of chemotherapy as per orders. Labs on day 1 and day 8 of each cycle. Please schedule continue Zometa every 4 weeks for next 4 doses. MD visit on cycle 3-day 4 Referral to [facility] Dr. Aris Lot for consideration of HDT-Auto HSCT for myeloma    All of the patients questions were answered with apparent satisfaction. The patient knows to call the clinic with any problems, questions or concerns.   The total time spent in the appointment was 30 minutes and more than 50% was on counseling and direct patient cares.      Sullivan Lone MD [place] AAHIVMS [facility] Hematology/Oncology Physician [place]  (Office):  [phone] (Work cell):  [phone] (Fax):           [phone]  05/09/2020 9:00 AM  I, Reinaldo Raddle, am acting as scribe for Dr. Sullivan Lone, MD.   .I have reviewed the above documentation for accuracy and completeness, and I agree with the above. Brunetta Genera MD

## 2020-05-09 ENCOUNTER — Inpatient Hospital Stay: Payer: BC Managed Care – PPO

## 2020-05-09 ENCOUNTER — Other Ambulatory Visit: Payer: Self-pay

## 2020-05-09 ENCOUNTER — Inpatient Hospital Stay (HOSPITAL_BASED_OUTPATIENT_CLINIC_OR_DEPARTMENT_OTHER): Payer: BC Managed Care – PPO | Admitting: Hematology

## 2020-05-09 ENCOUNTER — Telehealth: Payer: Self-pay | Admitting: *Deleted

## 2020-05-09 ENCOUNTER — Ambulatory Visit: Payer: BC Managed Care – PPO

## 2020-05-09 VITALS — BP 109/47 | HR 89 | Temp 97.0°F | Resp 18 | Ht 64.0 in | Wt 241.9 lb

## 2020-05-09 DIAGNOSIS — C7951 Secondary malignant neoplasm of bone: Secondary | ICD-10-CM | POA: Diagnosis not present

## 2020-05-09 DIAGNOSIS — C9000 Multiple myeloma not having achieved remission: Secondary | ICD-10-CM

## 2020-05-09 DIAGNOSIS — Z5112 Encounter for antineoplastic immunotherapy: Secondary | ICD-10-CM | POA: Diagnosis not present

## 2020-05-09 DIAGNOSIS — D630 Anemia in neoplastic disease: Secondary | ICD-10-CM | POA: Diagnosis not present

## 2020-05-09 DIAGNOSIS — Z5111 Encounter for antineoplastic chemotherapy: Secondary | ICD-10-CM | POA: Diagnosis not present

## 2020-05-09 DIAGNOSIS — G893 Neoplasm related pain (acute) (chronic): Secondary | ICD-10-CM

## 2020-05-09 DIAGNOSIS — Z79899 Other long term (current) drug therapy: Secondary | ICD-10-CM | POA: Diagnosis not present

## 2020-05-09 DIAGNOSIS — R609 Edema, unspecified: Secondary | ICD-10-CM

## 2020-05-09 DIAGNOSIS — Z7189 Other specified counseling: Secondary | ICD-10-CM

## 2020-05-09 MED ORDER — FUROSEMIDE 20 MG PO TABS: 20.0000 mg | ORAL_TABLET | Freq: Two times a day (BID) | ORAL | 1 refills | Status: DC

## 2020-05-09 MED ORDER — PROCHLORPERAZINE MALEATE 10 MG PO TABS
ORAL_TABLET | ORAL | Status: AC
  Filled 2020-05-09: qty 1

## 2020-05-09 MED ORDER — FENTANYL 25 MCG/HR TD PT72: 1.0000 | MEDICATED_PATCH | TRANSDERMAL | 0 refills | Status: DC

## 2020-05-09 MED ORDER — BORTEZOMIB CHEMO SQ INJECTION 3.5 MG (2.5MG/ML)
1.3000 mg/m2 | Freq: Once | INTRAMUSCULAR | Status: AC
Start: 2020-05-09 — End: 2020-05-09
  Administered 2020-05-09: 2.75 mg via SUBCUTANEOUS
  Filled 2020-05-09: qty 1.1

## 2020-05-09 MED ORDER — OXYCODONE HCL 5 MG PO TABS: 5.0000 mg | ORAL_TABLET | Freq: Four times a day (QID) | ORAL | 0 refills | Status: DC | PRN

## 2020-05-09 MED ORDER — PROCHLORPERAZINE MALEATE 10 MG PO TABS
10.0000 mg | ORAL_TABLET | Freq: Once | ORAL | Status: AC
  Administered 2020-05-09: 10 mg via ORAL

## 2020-05-09 NOTE — Patient Instructions (Signed)
[facility] Discharge Instructions for Patients Receiving Chemotherapy  Today you received the following chemotherapy agents: bortezomib.  To help prevent nausea and vomiting after your treatment, we encourage you to take your nausea medication as directed.   If you develop nausea and vomiting that is not controlled by your nausea medication, call the clinic.   BELOW ARE SYMPTOMS THAT SHOULD BE REPORTED IMMEDIATELY:  *FEVER GREATER THAN 100.5 F  *CHILLS WITH OR WITHOUT FEVER  NAUSEA AND VOMITING THAT IS NOT CONTROLLED WITH YOUR NAUSEA MEDICATION  *UNUSUAL SHORTNESS OF BREATH  *UNUSUAL BRUISING OR BLEEDING  TENDERNESS IN MOUTH AND THROAT WITH OR WITHOUT PRESENCE OF ULCERS  *URINARY PROBLEMS  *BOWEL PROBLEMS  UNUSUAL RASH Items with * indicate a potential emergency and should be followed up as soon as possible.  Feel free to call the clinic should you have any questions or concerns. The clinic phone number is [phone].  Please show the CHEMO ALERT CARD at check-in to the Emergency Department and triage nurse.   

## 2020-05-09 NOTE — Telephone Encounter (Signed)
Contacted Dr. Gaynelle Adu office at [facility] - spoke with Carmell Austria. Advised her that Dr. Irene Limbo had referred patient to Dr. Aris Lot for consideration for transplant. She acknowledged information and stated she made a note in patient's [facility]] chart. Information - demographics/OV note/labs/path/image - faxed to their office [phone].Fax confirmation received.

## 2020-05-13 ENCOUNTER — Telehealth: Payer: Self-pay | Admitting: Hematology

## 2020-05-13 NOTE — Telephone Encounter (Signed)
Scheduled follow-up appointments per 3/24 los. Patient is aware.

## 2020-05-16 ENCOUNTER — Other Ambulatory Visit: Payer: Self-pay | Admitting: Hematology

## 2020-05-16 DIAGNOSIS — Z7189 Other specified counseling: Secondary | ICD-10-CM

## 2020-05-16 DIAGNOSIS — C9000 Multiple myeloma not having achieved remission: Secondary | ICD-10-CM

## 2020-05-16 NOTE — Telephone Encounter (Signed)
Please review for refill.  

## 2020-05-20 ENCOUNTER — Other Ambulatory Visit: Payer: Self-pay

## 2020-05-20 ENCOUNTER — Other Ambulatory Visit: Payer: BC Managed Care – PPO

## 2020-05-20 ENCOUNTER — Inpatient Hospital Stay: Payer: BC Managed Care – PPO

## 2020-05-20 ENCOUNTER — Other Ambulatory Visit: Payer: Self-pay | Admitting: Hematology

## 2020-05-20 ENCOUNTER — Inpatient Hospital Stay: Payer: BC Managed Care – PPO | Attending: Hematology

## 2020-05-20 ENCOUNTER — Ambulatory Visit: Payer: BC Managed Care – PPO | Admitting: Hematology

## 2020-05-20 VITALS — BP 107/60 | HR 84 | Temp 98.5°F | Resp 18 | Wt 245.4 lb

## 2020-05-20 DIAGNOSIS — Z5112 Encounter for antineoplastic immunotherapy: Secondary | ICD-10-CM | POA: Insufficient documentation

## 2020-05-20 DIAGNOSIS — C9000 Multiple myeloma not having achieved remission: Secondary | ICD-10-CM

## 2020-05-20 DIAGNOSIS — Z7189 Other specified counseling: Secondary | ICD-10-CM

## 2020-05-20 DIAGNOSIS — Z79899 Other long term (current) drug therapy: Secondary | ICD-10-CM | POA: Diagnosis not present

## 2020-05-20 DIAGNOSIS — Z5111 Encounter for antineoplastic chemotherapy: Secondary | ICD-10-CM

## 2020-05-20 LAB — CBC WITH DIFFERENTIAL/PLATELET
Abs Immature Granulocytes: 0.1 10*3/uL — ABNORMAL HIGH (ref 0.00–0.07)
Basophils Absolute: 0 10*3/uL (ref 0.0–0.1)
Basophils Relative: 0 %
Eosinophils Absolute: 0.1 10*3/uL (ref 0.0–0.5)
Eosinophils Relative: 1 %
HCT: 25.9 % — ABNORMAL LOW (ref 36.0–46.0)
Hemoglobin: 8.1 g/dL — ABNORMAL LOW (ref 12.0–15.0)
Immature Granulocytes: 1 %
Lymphocytes Relative: 5 %
Lymphs Abs: 0.4 10*3/uL — ABNORMAL LOW (ref 0.7–4.0)
MCH: 32.4 pg (ref 26.0–34.0)
MCHC: 31.3 g/dL (ref 30.0–36.0)
MCV: 103.6 fL — ABNORMAL HIGH (ref 80.0–100.0)
Monocytes Absolute: 0.8 10*3/uL (ref 0.1–1.0)
Monocytes Relative: 10 %
Neutro Abs: 6 10*3/uL (ref 1.7–7.7)
Neutrophils Relative %: 83 %
Platelets: 256 10*3/uL (ref 150–400)
RBC: 2.5 MIL/uL — ABNORMAL LOW (ref 3.87–5.11)
RDW: 19.7 % — ABNORMAL HIGH (ref 11.5–15.5)
WBC: 7.3 10*3/uL (ref 4.0–10.5)
nRBC: 0.4 % — ABNORMAL HIGH (ref 0.0–0.2)

## 2020-05-20 LAB — CMP (CANCER CENTER ONLY)
ALT: 11 U/L (ref 0–44)
AST: 8 U/L — ABNORMAL LOW (ref 15–41)
Albumin: 2.9 g/dL — ABNORMAL LOW (ref 3.5–5.0)
Alkaline Phosphatase: 98 U/L (ref 38–126)
Anion gap: 13 (ref 5–15)
BUN: 12 mg/dL (ref 6–20)
CO2: 24 mmol/L (ref 22–32)
Calcium: 8.1 mg/dL — ABNORMAL LOW (ref 8.9–10.3)
Chloride: 103 mmol/L (ref 98–111)
Creatinine: 0.68 mg/dL (ref 0.44–1.00)
GFR, Estimated: >60 mL/min (ref >60)
Glucose, Bld: 128 mg/dL — ABNORMAL HIGH (ref 70–99)
Potassium: 3.6 mmol/L (ref 3.5–5.1)
Sodium: 140 mmol/L (ref 135–145)
Total Bilirubin: 0.3 mg/dL (ref 0.3–1.2)
Total Protein: 5.7 g/dL — ABNORMAL LOW (ref 6.5–8.1)

## 2020-05-20 LAB — MAGNESIUM: Magnesium: 1.8 mg/dL (ref 1.7–2.4)

## 2020-05-20 MED ORDER — SODIUM CHLORIDE 0.9% FLUSH
10.0000 mL | INTRAVENOUS | Status: DC | PRN
  Filled 2020-05-20: qty 10

## 2020-05-20 MED ORDER — SODIUM CHLORIDE 0.9 % IV SOLN
200.0000 mg/m2 | Freq: Once | INTRAVENOUS | Status: AC
  Administered 2020-05-20: 440 mg via INTRAVENOUS
  Filled 2020-05-20: qty 22

## 2020-05-20 MED ORDER — HEPARIN SOD (PORK) LOCK FLUSH 100 UNIT/ML IV SOLN
500.0000 [IU] | Freq: Once | INTRAVENOUS | Status: DC | PRN
  Filled 2020-05-20: qty 5

## 2020-05-20 MED ORDER — PALONOSETRON HCL INJECTION 0.25 MG/5ML
0.2500 mg | Freq: Once | INTRAVENOUS | Status: AC
  Administered 2020-05-20: 0.25 mg via INTRAVENOUS

## 2020-05-20 MED ORDER — BORTEZOMIB CHEMO SQ INJECTION 3.5 MG (2.5MG/ML)
1.3000 mg/m2 | Freq: Once | INTRAMUSCULAR | Status: AC
  Administered 2020-05-20: 2.75 mg via SUBCUTANEOUS
  Filled 2020-05-20: qty 1.1

## 2020-05-20 MED ORDER — SODIUM CHLORIDE 0.9 % IV SOLN
40.0000 mg | Freq: Once | INTRAVENOUS | Status: AC
  Administered 2020-05-20: 40 mg via INTRAVENOUS
  Filled 2020-05-20: qty 4

## 2020-05-20 MED ORDER — PALONOSETRON HCL INJECTION 0.25 MG/5ML
INTRAVENOUS | Status: AC
  Filled 2020-05-20: qty 5

## 2020-05-20 MED ORDER — SODIUM CHLORIDE 0.9 % IV SOLN
Freq: Once | INTRAVENOUS | Status: AC
Start: 2020-05-20 — End: 2020-05-20
  Filled 2020-05-20: qty 250

## 2020-05-20 NOTE — Patient Instructions (Signed)
[facility] Discharge Instructions for Patients Receiving Chemotherapy  Today you received the following chemotherapy agents Bortezimib(Velcade), Cyclophosphamide (Cytoxan).  To help prevent nausea and vomiting after your treatment, we encourage you to take your nausea medication as directed.  If you develop nausea and vomiting that is not controlled by your nausea medication, call the clinic.   BELOW ARE SYMPTOMS THAT SHOULD BE REPORTED IMMEDIATELY:  *FEVER GREATER THAN 100.5 F  *CHILLS WITH OR WITHOUT FEVER  NAUSEA AND VOMITING THAT IS NOT CONTROLLED WITH YOUR NAUSEA MEDICATION  *UNUSUAL SHORTNESS OF BREATH  *UNUSUAL BRUISING OR BLEEDING  TENDERNESS IN MOUTH AND THROAT WITH OR WITHOUT PRESENCE OF ULCERS  *URINARY PROBLEMS  *BOWEL PROBLEMS  UNUSUAL RASH Items with * indicate a potential emergency and should be followed up as soon as possible.  Feel free to call the clinic should you have any questions or concerns. The clinic phone number is [phone].  Please show the [place] at check-in to the Emergency Department and triage nurse.

## 2020-05-21 LAB — MULTIPLE MYELOMA PANEL, SERUM
Albumin SerPl Elph-Mcnc: 2.7 g/dL — ABNORMAL LOW (ref 2.9–4.4)
Albumin/Glob SerPl: 1.1 (ref 0.7–1.7)
Alpha 1: 0.4 g/dL (ref 0.0–0.4)
Alpha2 Glob SerPl Elph-Mcnc: 0.8 g/dL (ref 0.4–1.0)
B-Globulin SerPl Elph-Mcnc: 1 g/dL (ref 0.7–1.3)
Gamma Glob SerPl Elph-Mcnc: 0.3 g/dL — ABNORMAL LOW (ref 0.4–1.8)
Globulin, Total: 2.6 g/dL (ref 2.2–3.9)
IgA: 39 mg/dL — ABNORMAL LOW (ref 87–352)
IgG (Immunoglobin G), Serum: 456 mg/dL — ABNORMAL LOW (ref 586–1602)
IgM (Immunoglobulin M), Srm: 9 mg/dL — ABNORMAL LOW (ref 26–217)
M Protein SerPl Elph-Mcnc: 0.2 g/dL — ABNORMAL HIGH
Total Protein ELP: 5.3 g/dL — ABNORMAL LOW (ref 6.0–8.5)

## 2020-05-22 NOTE — Progress Notes (Signed)
HEMATOLOGY/ONCOLOGY CLINIC NOTE  Date of Service: 05/23/2020  Patient Care Team: Celene Squibb, MD as PCP - General (Internal Medicine) Corrie Mckusick, DO as Consulting Physician (Interventional Radiology)  CHIEF COMPLAINTS/PURPOSE OF CONSULTATION:  Multiple Myeloma- continued mx  HISTORY OF PRESENTING ILLNESS:   Stacey Montoya is a wonderful 45 y.o. female who is here today for evaluation and management of recently diagnosed Myeloma. The patient's last visit with [place] was inpatient on 05/09/2020. The pt reports that she is doing well overall. We are joined today by her husband. She is here for C3D4 CyBorD.  The pt reports that she still has much swelling in her leg, but this is improving. The pt notes that her left leg was worse a few days ago. She still elevates her legs when resting. The pt notes that the pain going down her leg is now localized in her lower leg and is on the verge of a cramping pain. She notes that massaging this is uncomfortable and movement is also painful. The pt notes that her hands are starting to peel. She continues to moisturize them well. The pt notes that showering is still very exhaustive to the pt. She is using the pain meds and this is controlling her pain. The pt notes that she has an appointment with [facility] w Dr. Aris Lot on May 3.  Lab results today 05/20/2020 of CBC w/diff and CMP is as follows: all values are WNL except for RBC of 2.50, Hgb of 8.1, HCT of 25.9, MCV of 103.6, RDW of 19.7, nRBC of 0.4, Lymphs Abs of 0.4K, Abs Immature Granulocytes of 0.10K, Glucose of 128, Calcium of 8.1, Total Protein of 5.7, Albumin of 2.9, AST of 8. 05/20/2020 Magnesium of 1.8. 05/20/2020 MMP WNL except IgG of 456, IgA of 39, IgM of 9, Total Protein of 5.3, Albumin of 2.7, Gamma Glob of 0.3, m-protein of 0.2.  On review of systems, pt reports fatigue, leg swelling, lower leg pain/cramping, skin peeling on hands and denies change in bowel habits, decreased appetite,  tingling/numbness in hands/feet, new bone pains, and any other symptoms.  MEDICAL HISTORY:  Past Medical History:  Diagnosis Date  . Abnormal Pap smear of cervix   . Back pain   . Hyperlipidemia     SURGICAL HISTORY: Past Surgical History:  Procedure Laterality Date  . CERVICAL CONIZATION W/BX  12/02/2010   Procedure: CONIZATION CERVIX WITH BIOPSY;  Surgeon: Arloa Koh;  Location: [facility];  Service: Gynecology;  Laterality: N/A;  COLD KNIFE  . CESAREAN SECTION    . DILATION AND CURETTAGE OF UTERUS  12/02/2010   Procedure: DILATATION AND CURETTAGE (D&C);  Surgeon: Arloa Koh;  Location: [facility];  Service: Gynecology;  Laterality: N/A;  . IR RADIOLOGIST EVAL & MGMT  04/16/2020  . left hand     drain - infection  . WISDOM TOOTH EXTRACTION      SOCIAL HISTORY: Social History   Socioeconomic History  . Marital status: Married    Spouse name: Not on file  . Number of children: Not on file  . Years of education: Not on file  . Highest education level: Not on file  Occupational History  . Not on file  Tobacco Use  . Smoking status: Current Every Day Smoker    Packs/day: 0.50    Years: 20.00    Pack years: 10.00    Types: Cigarettes  . Smokeless tobacco: Never Used  Vaping Use  . Vaping Use: Every day  Substance and Sexual Activity  . Alcohol use: Not Currently    Comment: socially - 6 beers per month  . Drug use: Not Currently  . Sexual activity: Yes    Partners: Male    Birth control/protection: Condom  Other Topics Concern  . Not on file  Social History Narrative   ** Merged History Encounter **       Social Determinants of Health   Financial Resource Strain: Not on file  Food Insecurity: Not on file  Transportation Needs: Not on file  Physical Activity: Not on file  Stress: Not on file  Social Connections: Not on file  Intimate Partner Violence: Not on file    FAMILY HISTORY: Family History  Problem Relation Age of Onset  . Lung cancer Maternal  Grandfather   . Pancreatic cancer Paternal Grandfather     ALLERGIES:  is allergic to zithromax [azithromycin].  MEDICATIONS:  Current Outpatient Medications  Medication Sig Dispense Refill  . acyclovir (ZOVIRAX) 400 MG tablet Take 1 tablet (400 mg total) by mouth 2 (two) times daily. 60 tablet 3  . albuterol (VENTOLIN HFA) 108 (90 Base) MCG/ACT inhaler Inhale 1-2 puffs into the lungs as needed for wheezing.    Marland Kitchen amLODipine (NORVASC) 5 MG tablet Take 1 tablet (5 mg total) by mouth daily. 90 tablet 0  . cholecalciferol (VITAMIN D) 25 MCG tablet Take 2 tablets (2,000 Units total) by mouth daily. 180 tablet 0  . ergocalciferol (VITAMIN D2) 1.25 MG (50000 UT) capsule Take 1 capsule (50,000 Units total) by mouth once a week. 12 capsule 2  . fentaNYL (DURAGESIC) 25 MCG/HR Place 1 patch onto the skin every 3 (three) days. 10 patch 0  . furosemide (LASIX) 20 MG tablet Take 1 tablet (20 mg total) by mouth 2 (two) times daily. 60 tablet 1  . gabapentin (NEURONTIN) 300 MG capsule Take 1 capsule (300 mg total) by mouth 2 (two) times daily. 180 capsule 0  . LORazepam (ATIVAN) 0.5 MG tablet TAKE (1) TABLET BY MOUTH EVERY SIX HOURS AS NEEDED. 30 tablet 0  . magic mouthwash w/lidocaine SOLN Dispense 45m of magic mouthwash compounded preparation. Patient to use 580m4 times daily as needed for throat discomfort. 80 ml viscous lidocaine 2% 80 ml Mylanta 80 ml diphenhydramine at 12.5 mg per 5 ml elixir 80 ml nystatin at 100,000U per 5 mL suspension 80 ml distilled water 400 mL 1  . medroxyPROGESTERone (PROVERA) 10 MG tablet daily.    . metoprolol tartrate (LOPRESSOR) 50 MG tablet Take 1 tablet (50 mg total) by mouth 2 (two) times daily. 180 tablet 0  . nicotine (NICODERM CQ - DOSED IN MG/24 HOURS) 21 mg/24hr patch Place 1 patch (21 mg total) onto the skin daily. 28 patch 0  . norethindrone (MICRONOR) 0.35 MG tablet Take 1 tablet by mouth daily.    . norethindrone (MICRONOR,CAMILA,ERRIN) 0.35 MG tablet Take  1 tablet by mouth daily.      . ondansetron (ZOFRAN) 4 MG tablet Take 4 mg by mouth every 8 (eight) hours.    . ondansetron (ZOFRAN) 8 MG tablet Take 1 tablet (8 mg total) by mouth 2 (two) times daily as needed for refractory nausea / vomiting. Start on day 3 after Cytoxan. 30 tablet 1  . oxyCODONE (OXY IR/ROXICODONE) 5 MG immediate release tablet TAKE 1 TO 2 TABLETS BY MOUTH EVERY 6 HOURS AS NEEDED. 60 tablet 0  . polyethylene glycol (MIRALAX / GLYCOLAX) 17 g packet Take 17 g by mouth daily.  14 each 0  . prochlorperazine (COMPAZINE) 10 MG tablet Take 1 tablet (10 mg total) by mouth every 6 (six) hours as needed (Nausea or vomiting). 30 tablet 1   No current facility-administered medications for this visit.    REVIEW OF SYSTEMS:   10 Point review of Systems was done is negative except as noted above.  PHYSICAL EXAMINATION: ECOG PERFORMANCE STATUS: 2 - Symptomatic, <50% confined to bed VS stable Exam was given in infusion.  GENERAL:alert, in no acute distress and comfortable SKIN: no acute rashes, no significant lesions EYES: conjunctiva are pink and non-injected, sclera anicteric OROPHARYNX: MMM, no exudates, no oropharyngeal erythema or ulceration NECK: supple, no JVD LYMPH:  no palpable lymphadenopathy in the cervical, axillary or inguinal regions LUNGS: clear to auscultation b/l with normal respiratory effort HEART: regular rate & rhythm ABDOMEN:  normoactive bowel sounds , non tender, not distended. Extremity: no pedal edema PSYCH: alert & oriented x 3 with fluent speech NEURO: no focal motor/sensory deficits   LABORATORY DATA:  I have reviewed the data as listed  . CBC Latest Ref Rng & Units 05/20/2020 05/06/2020 04/29/2020  WBC 4.0 - 10.5 K/uL 7.3 3.9(L) 2.1(L)  Hemoglobin 12.0 - 15.0 g/dL 8.1(L) 8.3(L) 8.0(L)  Hematocrit 36.0 - 46.0 % 25.9(L) 26.4(L) 25.2(L)  Platelets 150 - 400 K/uL 256 63(L) 88(L)   ANC1.6k  . CMP Latest Ref Rng & Units 05/20/2020 05/06/2020 04/29/2020   Glucose 70 - 99 mg/dL 128(H) 123(H) 115(H)  BUN 6 - 20 mg/dL 12 11 13   Creatinine 0.44 - 1.00 mg/dL 0.68 0.68 0.66  Sodium 135 - 145 mmol/L 140 136 137  Potassium 3.5 - 5.1 mmol/L 3.6 3.6 4.1  Chloride 98 - 111 mmol/L 103 104 108  CO2 22 - 32 mmol/L 24 25 23   Calcium 8.9 - 10.3 mg/dL 8.1(L) 7.9(L) 7.3(L)  Total Protein 6.5 - 8.1 g/dL 5.7(L) 5.5(L) 4.9(L)  Total Bilirubin 0.3 - 1.2 mg/dL 0.3 0.7 0.5  Alkaline Phos 38 - 126 U/L 98 147(H) 189(H)  AST 15 - 41 U/L 8(L) 11(L) 12(L)  ALT 0 - 44 U/L 11 22 21      RADIOGRAPHIC STUDIES: I have personally reviewed the radiological images as listed and agreed with the findings in the report. NM PET Image Initial (PI) Whole Body  Result Date: 04/24/2020 CLINICAL DATA:  Subsequent treatment strategy for multiple myeloma. EXAM: NUCLEAR MEDICINE PET WHOLE BODY TECHNIQUE: 9.8 mCi F-18 FDG was injected intravenously. Full-ring PET imaging was performed from the head to foot after the radiotracer. CT data was obtained and used for attenuation correction and anatomic localization. Fasting blood glucose: 92 mg/dl COMPARISON:  CT 04/02/2018 FINDINGS: Mediastinal blood pool activity: SUV max 2.2 HEAD/NECK: No hypermetabolic activity in the scalp. No hypermetabolic cervical lymph nodes. Incidental CT findings: none CHEST: No hypermetabolic mediastinal or hilar nodes. No suspicious pulmonary nodules on the CT scan. Incidental CT findings: none ABDOMEN/PELVIS: No abnormal hypermetabolic activity within the liver, pancreas, adrenal glands, or spleen. No hypermetabolic lymph nodes in the abdomen or pelvis. Incidental CT findings: Uterus and adnexa unremarkable. SKELETON: No hypermetabolic lesions within the axillary or appendicular skeleton. There is expansile lytic change on the CT portion exam at multiple sites without metabolic activity. For example prominent lytic expansile lesions in the LEFT RIGHT scapula. The upper ribs posteriorly are markedly expanded. For Example  the posterior RIGHT second rib measures 2.3 cm sec thickness with no metabolic activity. Likewise there multiple lytic lesions throughout the spine without focal metabolic activity  multiple lytic lesions in the pelvis and proximal femurs. Incidental CT findings: Multiple lytic lesions described. EXTREMITIES: No lytic change within thea distal extremities. No abnormal metabolic activity Incidental CT findings: As above IMPRESSION: 1. No evidence of active multiple myeloma by FDG PET imaging. 2. Multiple skeletal lesions which are expansile and lytic involving the ribs, scapula, spine, pelvis and proximal femurs. No associated metabolic activity consistent with dormant/remission multiple myeloma. 3. No evidence of soft tissue plasmacytoma Electronically Signed   By: Suzy Bouchard M.D.   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 3) anemia due to multiple myeloma becoming more apparent as the patient's hemoconcentration due to dehydration has been corrected.   4) acute renal failure related to dehydration hypercalcemia and multiple myeloma.  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 Chemistries stable. WBC improved. -Advised pt that her m-protein has decreased to 0.2 and is already in VGPR with over a 90% decrease.  -Will get [place] Lower  Extremities to observe for potential clot. Will order STAT. -Advised pt our current goal is to complete four cycles of induction treatment. Will plan logistics alongside [facility] transplant team. -Recommended pt wear compression socks (graded sports if medical grade are too bothersome) for swelling and keep legs elevated while sitting down and laying down.  -Advised pt that as the albumin improves, the swelling should also improve. -Recommended pt increase protein intake in diet. Recommended protein shake. -Continue Ergocalciferol twice weekly. -Continue Lasix at 40 mg. -Will increase Cytoxan dosage to 300 mg/m^2. Will monitor Hgb and Plt levels for potential transfusion needs. -Start Baby ASA. -Will see back in 2.5 weeks with C4D1. -Refill pain medications.   FOLLOW UP: Ultrasound bilateral lower extremity venous stat today to rule out DVT. Please schedule cycle 4 of CyBorD treatment as ordered. Port flush and labs on day 1 and day 8 of cycle. MD visit with cycle 4-day 1    All of the patients questions were answered with apparent satisfaction. The patient knows to call the clinic with any problems, questions or concerns.   The total time spent in the appointment was 30 minutes and more than 50% was on counseling and direct patient cares., ordering and mx of chemotherapy.      Sullivan Lone MD [place] AAHIVMS [facility] Hematology/Oncology Physician [facility]  (Office):       [phone] (Work cell):  [phone] (Fax):           [phone]  05/23/2020 10:47 AM  I, Reinaldo Raddle, am acting as scribe for Dr. Sullivan Lone, MD.   .I have reviewed the above documentation for accuracy and completeness, and I agree with the above. Brunetta Genera MD

## 2020-05-23 ENCOUNTER — Encounter (HOSPITAL_COMMUNITY): Payer: BC Managed Care – PPO

## 2020-05-23 ENCOUNTER — Inpatient Hospital Stay: Payer: BC Managed Care – PPO

## 2020-05-23 ENCOUNTER — Inpatient Hospital Stay: Payer: BC Managed Care – PPO | Admitting: Hematology

## 2020-05-23 ENCOUNTER — Other Ambulatory Visit: Payer: Self-pay

## 2020-05-23 VITALS — BP 106/69 | HR 79 | Temp 98.1°F | Resp 18

## 2020-05-23 DIAGNOSIS — Z5112 Encounter for antineoplastic immunotherapy: Secondary | ICD-10-CM | POA: Diagnosis not present

## 2020-05-23 DIAGNOSIS — C9000 Multiple myeloma not having achieved remission: Secondary | ICD-10-CM

## 2020-05-23 DIAGNOSIS — M7989 Other specified soft tissue disorders: Secondary | ICD-10-CM

## 2020-05-23 DIAGNOSIS — Z79899 Other long term (current) drug therapy: Secondary | ICD-10-CM | POA: Diagnosis not present

## 2020-05-23 DIAGNOSIS — Z7189 Other specified counseling: Secondary | ICD-10-CM

## 2020-05-23 DIAGNOSIS — Z5111 Encounter for antineoplastic chemotherapy: Secondary | ICD-10-CM | POA: Diagnosis not present

## 2020-05-23 DIAGNOSIS — G893 Neoplasm related pain (acute) (chronic): Secondary | ICD-10-CM

## 2020-05-23 MED ORDER — PROCHLORPERAZINE MALEATE 10 MG PO TABS
10.0000 mg | ORAL_TABLET | Freq: Once | ORAL | Status: AC
  Administered 2020-05-23: 10 mg via ORAL

## 2020-05-23 MED ORDER — FENTANYL 25 MCG/HR TD PT72: 1.0000 | MEDICATED_PATCH | TRANSDERMAL | 0 refills | Status: AC

## 2020-05-23 MED ORDER — PROCHLORPERAZINE MALEATE 10 MG PO TABS
ORAL_TABLET | ORAL | Status: AC
  Filled 2020-05-23: qty 1

## 2020-05-23 MED ORDER — OXYCODONE HCL 5 MG PO TABS: 5.0000 mg | ORAL_TABLET | Freq: Four times a day (QID) | ORAL | 0 refills | Status: DC | PRN

## 2020-05-23 MED ORDER — BORTEZOMIB CHEMO SQ INJECTION 3.5 MG (2.5MG/ML)
1.3000 mg/m2 | Freq: Once | INTRAMUSCULAR | Status: AC
  Administered 2020-05-23: 2.75 mg via SUBCUTANEOUS
  Filled 2020-05-23: qty 1.1

## 2020-05-24 ENCOUNTER — Telehealth: Payer: Self-pay

## 2020-05-24 ENCOUNTER — Ambulatory Visit (HOSPITAL_COMMUNITY)
Admission: RE | Admit: 2020-05-24 | Discharge: 2020-05-24 | Disposition: A | Discharge status: Home / Self Care | Payer: BC Managed Care – PPO | Source: Ambulatory Visit | Attending: Hematology | Admitting: Hematology

## 2020-05-24 ENCOUNTER — Other Ambulatory Visit: Payer: Self-pay | Admitting: Hematology

## 2020-05-24 DIAGNOSIS — M7989 Other specified soft tissue disorders: Secondary | ICD-10-CM | POA: Diagnosis not present

## 2020-05-24 DIAGNOSIS — C9000 Multiple myeloma not having achieved remission: Secondary | ICD-10-CM | POA: Diagnosis not present

## 2020-05-24 MED ORDER — APIXABAN (ELIQUIS) VTE STARTER PACK (10MG AND 5MG): ORAL_TABLET | ORAL | 0 refills | Status: DC

## 2020-05-24 NOTE — Progress Notes (Signed)
Called and discussed ultrasound results with the patient showing left lower extremity DVT.  BILATERAL:  -No evidence of popliteal cyst, bilaterally.  RIGHT:  - There is no evidence of deep vein thrombosis in the lower extremity.  - There is no evidence of superficial venous thrombosis.    LEFT:  - Findings consistent with acute deep vein thrombosis involving the left  femoral vein, left popliteal vein, left posterior tibial veins, left  peroneal veins, and left gastrocnemius veins.  - There is no evidence of superficial venous thrombosis.    *See table(s) above for measurements and observations.    Plan -Discussed ultrasound results from left lower extremity DVT with the patient. -Discussed and sent Eliquis prescription to her pharmacy to start immediately. -Discussed symptoms to monitor for and call for if any other new concerns.  Brunetta Genera

## 2020-05-24 NOTE — CV Procedure (Signed)
BLE venous duplex completed. Critical results given to Eustaquio Maize, RN at Dr. Grier Mitts office @ [phone]. Patient instructed to go straight to her pharmacy to pick up new medication.  Results can be found under chart review under CV PROC. 05/24/2020 12:22 PM Kemond Amorin RVT, RDMS

## 2020-05-24 NOTE — Telephone Encounter (Signed)
Contacted pt to let her know that her eliquis had been called in to her pharmacy. Pt verbalized understanding.

## 2020-05-27 ENCOUNTER — Other Ambulatory Visit: Payer: Self-pay

## 2020-05-27 ENCOUNTER — Inpatient Hospital Stay: Payer: BC Managed Care – PPO

## 2020-05-27 VITALS — BP 107/47 | HR 90 | Temp 98.3°F | Resp 17 | Wt 245.0 lb

## 2020-05-27 DIAGNOSIS — C7951 Secondary malignant neoplasm of bone: Secondary | ICD-10-CM

## 2020-05-27 DIAGNOSIS — C9000 Multiple myeloma not having achieved remission: Secondary | ICD-10-CM

## 2020-05-27 DIAGNOSIS — Z79899 Other long term (current) drug therapy: Secondary | ICD-10-CM | POA: Diagnosis not present

## 2020-05-27 DIAGNOSIS — Z5112 Encounter for antineoplastic immunotherapy: Secondary | ICD-10-CM | POA: Diagnosis not present

## 2020-05-27 DIAGNOSIS — Z7189 Other specified counseling: Secondary | ICD-10-CM

## 2020-05-27 DIAGNOSIS — Z5111 Encounter for antineoplastic chemotherapy: Secondary | ICD-10-CM

## 2020-05-27 LAB — CMP (CANCER CENTER ONLY)
ALT: 9 U/L (ref 0–44)
AST: <6 U/L — ABNORMAL LOW (ref 15–41)
Albumin: 2.9 g/dL — ABNORMAL LOW (ref 3.5–5.0)
Alkaline Phosphatase: 88 U/L (ref 38–126)
Anion gap: 12 (ref 5–15)
BUN: 12 mg/dL (ref 6–20)
CO2: 25 mmol/L (ref 22–32)
Calcium: 9.5 mg/dL (ref 8.9–10.3)
Chloride: 98 mmol/L (ref 98–111)
Creatinine: 0.7 mg/dL (ref 0.44–1.00)
GFR, Estimated: >60 mL/min (ref >60)
Glucose, Bld: 99 mg/dL (ref 70–99)
Potassium: 3.6 mmol/L (ref 3.5–5.1)
Sodium: 135 mmol/L (ref 135–145)
Total Bilirubin: 0.5 mg/dL (ref 0.3–1.2)
Total Protein: 6.6 g/dL (ref 6.5–8.1)

## 2020-05-27 LAB — CBC WITH DIFFERENTIAL/PLATELET
Abs Immature Granulocytes: 0.11 10*3/uL — ABNORMAL HIGH (ref 0.00–0.07)
Basophils Absolute: 0 10*3/uL (ref 0.0–0.1)
Basophils Relative: 0 %
Eosinophils Absolute: 0.1 10*3/uL (ref 0.0–0.5)
Eosinophils Relative: 1 %
HCT: 30.1 % — ABNORMAL LOW (ref 36.0–46.0)
Hemoglobin: 9.6 g/dL — ABNORMAL LOW (ref 12.0–15.0)
Immature Granulocytes: 1 %
Lymphocytes Relative: 3 %
Lymphs Abs: 0.3 10*3/uL — ABNORMAL LOW (ref 0.7–4.0)
MCH: 32.3 pg (ref 26.0–34.0)
MCHC: 31.9 g/dL (ref 30.0–36.0)
MCV: 101.3 fL — ABNORMAL HIGH (ref 80.0–100.0)
Monocytes Absolute: 1.3 10*3/uL — ABNORMAL HIGH (ref 0.1–1.0)
Monocytes Relative: 12 %
Neutro Abs: 8.6 10*3/uL — ABNORMAL HIGH (ref 1.7–7.7)
Neutrophils Relative %: 83 %
Platelets: 182 10*3/uL (ref 150–400)
RBC: 2.97 MIL/uL — ABNORMAL LOW (ref 3.87–5.11)
RDW: 18.6 % — ABNORMAL HIGH (ref 11.5–15.5)
WBC: 10.4 10*3/uL (ref 4.0–10.5)
nRBC: 0 % (ref 0.0–0.2)

## 2020-05-27 MED ORDER — SODIUM CHLORIDE 0.9 % IV SOLN
40.0000 mg | Freq: Once | INTRAVENOUS | Status: AC
  Administered 2020-05-27: 40 mg via INTRAVENOUS
  Filled 2020-05-27: qty 4

## 2020-05-27 MED ORDER — ZOLEDRONIC ACID 4 MG/100ML IV SOLN
INTRAVENOUS | Status: AC
  Filled 2020-05-27: qty 100

## 2020-05-27 MED ORDER — SODIUM CHLORIDE 0.9 % IV SOLN
Freq: Once | INTRAVENOUS | Status: AC
Start: 2020-05-27 — End: 2020-05-27
  Filled 2020-05-27: qty 250

## 2020-05-27 MED ORDER — SODIUM CHLORIDE 0.9 % IV SOLN
200.0000 mg/m2 | Freq: Once | INTRAVENOUS | Status: AC
  Administered 2020-05-27: 440 mg via INTRAVENOUS
  Filled 2020-05-27: qty 22

## 2020-05-27 MED ORDER — BORTEZOMIB CHEMO SQ INJECTION 3.5 MG (2.5MG/ML)
1.3000 mg/m2 | Freq: Once | INTRAMUSCULAR | Status: AC
  Administered 2020-05-27: 2.75 mg via SUBCUTANEOUS
  Filled 2020-05-27: qty 1.1

## 2020-05-27 MED ORDER — PALONOSETRON HCL INJECTION 0.25 MG/5ML
INTRAVENOUS | Status: AC
  Filled 2020-05-27: qty 5

## 2020-05-27 MED ORDER — PALONOSETRON HCL INJECTION 0.25 MG/5ML
0.2500 mg | Freq: Once | INTRAVENOUS | Status: AC
  Administered 2020-05-27: 0.25 mg via INTRAVENOUS

## 2020-05-27 MED ORDER — ZOLEDRONIC ACID 4 MG/100ML IV SOLN
4.0000 mg | Freq: Once | INTRAVENOUS | Status: AC
  Administered 2020-05-27: 4 mg via INTRAVENOUS

## 2020-05-27 NOTE — Patient Instructions (Signed)
[facility] Discharge Instructions for Patients Receiving Chemotherapy  Today you received the following chemotherapy agents bortezomib  To help prevent nausea and vomiting after your treatment, we encourage you to take your nausea medication as directed.  If you develop nausea and vomiting that is not controlled by your nausea medication, call the clinic.   BELOW ARE SYMPTOMS THAT SHOULD BE REPORTED IMMEDIATELY:  *FEVER GREATER THAN 100.5 F  *CHILLS WITH OR WITHOUT FEVER  NAUSEA AND VOMITING THAT IS NOT CONTROLLED WITH YOUR NAUSEA MEDICATION  *UNUSUAL SHORTNESS OF BREATH  *UNUSUAL BRUISING OR BLEEDING  TENDERNESS IN MOUTH AND THROAT WITH OR WITHOUT PRESENCE OF ULCERS  *URINARY PROBLEMS  *BOWEL PROBLEMS  UNUSUAL RASH Items with * indicate a potential emergency and should be followed up as soon as possible.  Feel free to call the clinic should you have any questions or concerns. The clinic phone number is [phone].  Please show the [place] at check-in to the Emergency Department and triage nurse.  Zoledronic Acid Injection (Hypercalcemia, Oncology) What is this medicine? ZOLEDRONIC ACID (ZOE le dron ik AS id) slows calcium loss from bones. It high calcium levels in the blood from some kinds of cancer. It may be used in other people at risk for bone loss. This medicine may be used for other purposes; ask your health care provider or pharmacist if you have questions. COMMON BRAND NAME(S): Zometa What should I tell my health care provider before I take this medicine? They need to know if you have any of these conditions:  cancer  dehydration  dental disease  kidney disease  liver disease  low levels of calcium in the blood  lung or breathing disease (asthma)  receiving steroids like dexamethasone or prednisone  an unusual or allergic reaction to zoledronic acid, other medicines, foods, dyes, or preservatives  pregnant or trying to  get pregnant  breast-feeding How should I use this medicine? This drug is injected into a vein. It is given by a health care provider in a hospital or clinic setting. Talk to your health care provider about the use of this drug in children. Special care may be needed. Overdosage: If you think you have taken too much of this medicine contact a poison control center or emergency room at once. NOTE: This medicine is only for you. Do not share this medicine with others. What if I miss a dose? Keep appointments for follow-up doses. It is important not to miss your dose. Call your health care provider if you are unable to keep an appointment. What may interact with this medicine?  certain antibiotics given by injection  NSAIDs, medicines for pain and inflammation, like ibuprofen or naproxen  some diuretics like bumetanide, furosemide  teriparatide  thalidomide This list may not describe all possible interactions. Give your health care provider a list of all the medicines, herbs, non-prescription drugs, or dietary supplements you use. Also tell them if you smoke, drink alcohol, or use illegal drugs. Some items may interact with your medicine. What should I watch for while using this medicine? Visit your health care provider for regular checks on your progress. It may be some time before you see the benefit from this drug. Some people who take this drug have severe bone, joint, or muscle pain. This drug may also increase your risk for jaw problems or a broken thigh bone. Tell your health care provider right away if you have severe pain in your jaw, bones, joints, or  muscles. Tell you health care provider if you have any pain that does not go away or that gets worse. Tell your dentist and dental surgeon that you are taking this drug. You should not have major dental surgery while on this drug. See your dentist to have a dental exam and fix any dental problems before starting this drug. Take good care  of your teeth while on this drug. Make sure you see your dentist for regular follow-up appointments. You should make sure you get enough calcium and vitamin D while you are taking this drug. Discuss the foods you eat and the vitamins you take with your health care provider. Check with your health care provider if you have severe diarrhea, nausea, and vomiting, or if you sweat a lot. The loss of too much body fluid may make it dangerous for you to take this drug. You may need blood work done while you are taking this drug. Do not become pregnant while taking this drug. Women should inform their health care provider if they wish to become pregnant or think they might be pregnant. There is potential for serious harm to an unborn child. Talk to your health care provider for more information. What side effects may I notice from receiving this medicine? Side effects that you should report to your doctor or health care provider as soon as possible:  allergic reactions (skin rash, itching or hives; swelling of the face, lips, or tongue)  bone pain  infection (fever, chills, cough, sore throat, pain or trouble passing urine)  jaw pain, especially after dental work  joint pain  kidney injury (trouble passing urine or change in the amount of urine)  low blood pressure (dizziness; feeling faint or lightheaded, falls; unusually weak or tired)  low calcium levels (fast heartbeat; muscle cramps or pain; pain, tingling, or numbness in the hands or feet; seizures)  low magnesium levels (fast, irregular heartbeat; muscle cramp or pain; muscle weakness; tremors; seizures)  low red blood cell counts (trouble breathing; feeling faint; lightheaded, falls; unusually weak or tired)  muscle pain  redness, blistering, peeling, or loosening of the skin, including inside the mouth  severe diarrhea  swelling of the ankles, feet, hands  trouble breathing Side effects that usually do not require medical  attention (report to your doctor or health care provider if they continue or are bothersome):  anxious  constipation  coughing  depressed mood  eye irritation, itching, or pain  fever  general ill feeling or flu-like symptoms  nausea  pain, redness, or irritation at site where injected  trouble sleeping This list may not describe all possible side effects. Call your doctor for medical advice about side effects. You may report side effects to FDA at 1-800-FDA-1088. Where should I keep my medicine? This drug is given in a hospital or clinic. It will not be stored at home. NOTE: This sheet is a summary. It may not cover all possible information. If you have questions about this medicine, talk to your doctor, pharmacist, or health care provider.  2021 Elsevier/Gold Standard (2018-11-17 09:13:00)

## 2020-05-29 ENCOUNTER — Other Ambulatory Visit: Payer: Self-pay | Admitting: Hematology

## 2020-05-29 ENCOUNTER — Telehealth: Payer: Self-pay | Admitting: Hematology

## 2020-05-29 DIAGNOSIS — Z7189 Other specified counseling: Secondary | ICD-10-CM

## 2020-05-29 DIAGNOSIS — C9000 Multiple myeloma not having achieved remission: Secondary | ICD-10-CM

## 2020-05-29 NOTE — Telephone Encounter (Signed)
Scheduled follow-up appointment per 4/7 los. Patient is aware.

## 2020-05-29 NOTE — Telephone Encounter (Signed)
Refill request

## 2020-05-30 ENCOUNTER — Other Ambulatory Visit: Payer: Self-pay

## 2020-05-30 ENCOUNTER — Inpatient Hospital Stay: Payer: BC Managed Care – PPO

## 2020-05-30 VITALS — BP 110/67 | HR 78 | Temp 98.4°F | Resp 18

## 2020-05-30 DIAGNOSIS — C9000 Multiple myeloma not having achieved remission: Secondary | ICD-10-CM | POA: Diagnosis not present

## 2020-05-30 DIAGNOSIS — Z79899 Other long term (current) drug therapy: Secondary | ICD-10-CM | POA: Diagnosis not present

## 2020-05-30 DIAGNOSIS — Z5112 Encounter for antineoplastic immunotherapy: Secondary | ICD-10-CM | POA: Diagnosis not present

## 2020-05-30 DIAGNOSIS — Z7189 Other specified counseling: Secondary | ICD-10-CM

## 2020-05-30 MED ORDER — BORTEZOMIB CHEMO SQ INJECTION 3.5 MG (2.5MG/ML)
1.3000 mg/m2 | Freq: Once | INTRAMUSCULAR | Status: AC
  Administered 2020-05-30: 2.75 mg via SUBCUTANEOUS
  Filled 2020-05-30: qty 1.1

## 2020-05-30 NOTE — Patient Instructions (Signed)
[facility] Discharge Instructions for Patients Receiving Chemotherapy  Today you received the following chemotherapy agents: Bortezomib (Velcade)  To help prevent nausea and vomiting after your treatment, we encourage you to take your nausea medication  as prescribed.    If you develop nausea and vomiting that is not controlled by your nausea medication, call the clinic.   BELOW ARE SYMPTOMS THAT SHOULD BE REPORTED IMMEDIATELY:  *FEVER GREATER THAN 100.5 F  *CHILLS WITH OR WITHOUT FEVER  NAUSEA AND VOMITING THAT IS NOT CONTROLLED WITH YOUR NAUSEA MEDICATION  *UNUSUAL SHORTNESS OF BREATH  *UNUSUAL BRUISING OR BLEEDING  TENDERNESS IN MOUTH AND THROAT WITH OR WITHOUT PRESENCE OF ULCERS  *URINARY PROBLEMS  *BOWEL PROBLEMS  UNUSUAL RASH Items with * indicate a potential emergency and should be followed up as soon as possible.  Feel free to call the clinic should you have any questions or concerns. The clinic phone number is [phone].  Please show the [place] at check-in to the Emergency Department and triage nurse.

## 2020-05-30 NOTE — Progress Notes (Signed)
Patient reports taking compazine prior to infusion appointment. Pharmacy notified.

## 2020-06-03 DIAGNOSIS — C9000 Multiple myeloma not having achieved remission: Secondary | ICD-10-CM | POA: Diagnosis not present

## 2020-06-05 ENCOUNTER — Other Ambulatory Visit: Payer: Self-pay | Admitting: Hematology

## 2020-06-05 DIAGNOSIS — C9000 Multiple myeloma not having achieved remission: Secondary | ICD-10-CM

## 2020-06-05 DIAGNOSIS — Z7189 Other specified counseling: Secondary | ICD-10-CM

## 2020-06-09 NOTE — Progress Notes (Signed)
HEMATOLOGY/ONCOLOGY CLINIC NOTE  Date of Service: 06/10/2020  Patient Care Team: Celene Squibb, MD as PCP - General (Internal Medicine) Corrie Mckusick, DO as Consulting Physician (Interventional Radiology)  CHIEF COMPLAINTS/PURPOSE OF CONSULTATION:  Multiple Myeloma- continued mx  HISTORY OF PRESENTING ILLNESS:   Stacey Montoya is a wonderful 45 y.o. female who is here today for evaluation and management of recently diagnosed Myeloma. The patient's last visit with [place] was inpatient on 05/23/2020. The pt reports that she is doing well overall. We are joined today by her husband. She is here for C4D1 CyBorD.  The pt reports that she was seen by Janice Coffin, who told her she would not need any prophylaxis from the orthopaedic oncology perspective. The pt will not need any kind of procedure and can proceed with chemo according to Dr. Mylo Red.  The pt reports that her leg has been improving once she has gone down on the Eliquis dosage. The pt notes that she was in extreme pain when on the four pills daily as opposed to the two. The pt notes that this hurt her mobility, but she is starting back to improving the mobility and activity. The pt notes the swelling has improved. The pt notes that her Ergocalciferol will not be refilled by her pharmacy as they were unaware of the move from once to twice daily. The pt notes she can npo longer go up and down the stairs. She notes she tires out very quickly, but can use a walker to move around the house and garage. The pt notes she still feels a lot of fluid and swelling. The pt notes that she does not like weight or anything on the tips of her toes. The pt notes that she is taking the breakthrough medicine every six hours at this time.  Lab results today 06/10/2020 of CBC w/diff and CMP is as follows: all values are WNL except for Rbc of 3.00, Hgb of 9.5, HCT of 30.4, MCV of 101.3, RDW of 18.7, Lymphs Abs of 0.3K, Glucose of 106, Total Protein of 6.0,  Albumin of 3.2, AST of 7.  On review of systems, pt reports cotinued leg swelling and feeling of fluid retention and denies acute tingling/numbness in hands/feet, decreased appetite, changes in bowel habits, and any other symptoms.  MEDICAL HISTORY:  Past Medical History:  Diagnosis Date  . Abnormal Pap smear of cervix   . Back pain   . Hyperlipidemia     SURGICAL HISTORY: Past Surgical History:  Procedure Laterality Date  . CERVICAL CONIZATION W/BX  12/02/2010   Procedure: CONIZATION CERVIX WITH BIOPSY;  Surgeon: Arloa Koh;  Location: [facility];  Service: Gynecology;  Laterality: N/A;  COLD KNIFE  . CESAREAN SECTION    . DILATION AND CURETTAGE OF UTERUS  12/02/2010   Procedure: DILATATION AND CURETTAGE (D&C);  Surgeon: Arloa Koh;  Location: [facility];  Service: Gynecology;  Laterality: N/A;  . IR RADIOLOGIST EVAL & MGMT  04/16/2020  . left hand     drain - infection  . WISDOM TOOTH EXTRACTION      SOCIAL HISTORY: Social History   Socioeconomic History  . Marital status: Married    Spouse name: Not on file  . Number of children: Not on file  . Years of education: Not on file  . Highest education level: Not on file  Occupational History  . Not on file  Tobacco Use  . Smoking status: Current Every Day Smoker  Packs/day: 0.50    Years: 20.00    Pack years: 10.00    Types: Cigarettes  . Smokeless tobacco: Never Used  Vaping Use  . Vaping Use: Every day  Substance and Sexual Activity  . Alcohol use: Not Currently    Comment: socially - 6 beers per month  . Drug use: Not Currently  . Sexual activity: Yes    Partners: Male    Birth control/protection: Condom  Other Topics Concern  . Not on file  Social History Narrative   ** Merged History Encounter **       Social Determinants of Health   Financial Resource Strain: Not on file  Food Insecurity: Not on file  Transportation Needs: Not on file  Physical Activity: Not on file  Stress: Not on file  Social  Connections: Not on file  Intimate Partner Violence: Not on file    FAMILY HISTORY: Family History  Problem Relation Age of Onset  . Lung cancer Maternal Grandfather   . Pancreatic cancer Paternal Grandfather     ALLERGIES:  is allergic to zithromax [azithromycin].  MEDICATIONS:  Current Outpatient Medications  Medication Sig Dispense Refill  . acyclovir (ZOVIRAX) 400 MG tablet Take 1 tablet (400 mg total) by mouth 2 (two) times daily. 60 tablet 3  . albuterol (VENTOLIN HFA) 108 (90 Base) MCG/ACT inhaler Inhale 1-2 puffs into the lungs as needed for wheezing.    Marland Kitchen amLODipine (NORVASC) 5 MG tablet Take 1 tablet (5 mg total) by mouth daily. 90 tablet 0  . APIXABAN (ELIQUIS) VTE STARTER PACK (10MG AND 5MG) Take as directed on package: start with two-76m tablets twice daily for 7 days. On day 8, switch to one-561mtablet twice daily. 1 each 0  . cholecalciferol (VITAMIN D) 25 MCG tablet Take 2 tablets (2,000 Units total) by mouth daily. 180 tablet 0  . ergocalciferol (VITAMIN D2) 1.25 MG (50000 UT) capsule Take 1 capsule (50,000 Units total) by mouth once a week. 12 capsule 2  . fentaNYL (DURAGESIC) 25 MCG/HR Place 1 patch onto the skin every 3 (three) days. 10 patch 0  . furosemide (LASIX) 20 MG tablet Take 1 tablet (20 mg total) by mouth 2 (two) times daily. 60 tablet 1  . gabapentin (NEURONTIN) 300 MG capsule Take 1 capsule (300 mg total) by mouth 2 (two) times daily. 180 capsule 0  . LORazepam (ATIVAN) 0.5 MG tablet TAKE (1) TABLET BY MOUTH EVERY SIX HOURS AS NEEDED. 30 tablet 0  . magic mouthwash w/lidocaine SOLN Dispense 40035mf magic mouthwash compounded preparation. Patient to use 5ml89mtimes daily as needed for throat discomfort. 80 ml viscous lidocaine 2% 80 ml Mylanta 80 ml diphenhydramine at 12.5 mg per 5 ml elixir 80 ml nystatin at 100,000U per 5 mL suspension 80 ml distilled water 400 mL 1  . medroxyPROGESTERone (PROVERA) 10 MG tablet daily.    . metoprolol tartrate  (LOPRESSOR) 50 MG tablet Take 1 tablet (50 mg total) by mouth 2 (two) times daily. 180 tablet 0  . nicotine (NICODERM CQ - DOSED IN MG/24 HOURS) 21 mg/24hr patch Place 1 patch (21 mg total) onto the skin daily. 28 patch 0  . norethindrone (MICRONOR) 0.35 MG tablet Take 1 tablet by mouth daily.    . norethindrone (MICRONOR,CAMILA,ERRIN) 0.35 MG tablet Take 1 tablet by mouth daily.      . ondansetron (ZOFRAN) 4 MG tablet Take 4 mg by mouth every 8 (eight) hours.    . ondansetron (ZOFRAN) 8 MG  tablet Take 1 tablet (8 mg total) by mouth 2 (two) times daily as needed for refractory nausea / vomiting. Start on day 3 after Cytoxan. 30 tablet 1  . oxyCODONE (OXY IR/ROXICODONE) 5 MG immediate release tablet TAKE 1 TO 2 TABLETS BY MOUTH EVERY 6 HOURS AS NEEDED. 60 tablet 0  . polyethylene glycol (MIRALAX / GLYCOLAX) 17 g packet Take 17 g by mouth daily. 14 each 0  . prochlorperazine (COMPAZINE) 10 MG tablet Take 1 tablet (10 mg total) by mouth every 6 (six) hours as needed (Nausea or vomiting). 30 tablet 1   No current facility-administered medications for this visit.    REVIEW OF SYSTEMS:   10 Point review of Systems was done is negative except as noted above.  PHYSICAL EXAMINATION: ECOG PERFORMANCE STATUS: 2 - Symptomatic, <50% confined to bed VS stable  Exam was given in infusion.  GENERAL:alert, in no acute distress and comfortable SKIN: no acute rashes, no significant lesions EYES: conjunctiva are pink and non-injected, sclera anicteric OROPHARYNX: MMM, no exudates, no oropharyngeal erythema or ulceration NECK: supple, no JVD LYMPH:  no palpable lymphadenopathy in the cervical, axillary or inguinal regions LUNGS: clear to auscultation b/l with normal respiratory effort HEART: regular rate & rhythm ABDOMEN:  normoactive bowel sounds , non tender, not distended. Extremity: no pedal edema PSYCH: alert & oriented x 3 with fluent speech NEURO: no focal motor/sensory deficits   LABORATORY  DATA:  I have reviewed the data as listed  . CBC Latest Ref Rng & Units 06/10/2020 05/27/2020 05/20/2020  WBC 4.0 - 10.5 K/uL 7.4 10.4 7.3  Hemoglobin 12.0 - 15.0 g/dL 9.5(L) 9.6(L) 8.1(L)  Hematocrit 36.0 - 46.0 % 30.4(L) 30.1(L) 25.9(L)  Platelets 150 - 400 K/uL 286 182 256   ANC1.6k  . CMP Latest Ref Rng & Units 06/10/2020 05/27/2020 05/20/2020  Glucose 70 - 99 mg/dL 106(H) 99 128(H)  BUN 6 - 20 mg/dL 16 12 12   Creatinine 0.44 - 1.00 mg/dL 0.69 0.70 0.68  Sodium 135 - 145 mmol/L 140 135 140  Potassium 3.5 - 5.1 mmol/L 3.7 3.6 3.6  Chloride 98 - 111 mmol/L 104 98 103  CO2 22 - 32 mmol/L 26 25 24   Calcium 8.9 - 10.3 mg/dL 9.1 9.5 8.1(L)  Total Protein 6.5 - 8.1 g/dL 6.0(L) 6.6 5.7(L)  Total Bilirubin 0.3 - 1.2 mg/dL 0.3 0.5 0.3  Alkaline Phos 38 - 126 U/L 79 88 98  AST 15 - 41 U/L 7(L) <6(L) 8(L)  ALT 0 - 44 U/L 10 9 11      RADIOGRAPHIC STUDIES: I have personally reviewed the radiological images as listed and agreed with the findings in the report. VAS [place] LOWER EXTREMITY VENOUS (DVT)  Result Date: 05/24/2020  Lower Venous DVT Study Indications: BLE edema LLE pain.  Risk Factors: Cancer (metastatic multiple myeloma) recent diagnosis on treatment. Comparison Study: No previous exams Performing Technologist: Rogelia Rohrer  Examination Guidelines: A complete evaluation includes B-mode imaging, spectral Doppler, color Doppler, and power Doppler as needed of all accessible portions of each vessel. Bilateral testing is considered an integral part of a complete examination. Limited examinations for reoccurring indications may be performed as noted. The reflux portion of the exam is performed with the patient in reverse Trendelenburg.  +---------+---------------+---------+-----------+----------+-------------------+ RIGHT    CompressibilityPhasicitySpontaneityPropertiesThrombus Aging      +---------+---------------+---------+-----------+----------+-------------------+ CFV      Full            Yes      Yes                                      +---------+---------------+---------+-----------+----------+-------------------+  SFJ      Full                                                             +---------+---------------+---------+-----------+----------+-------------------+ FV Prox  Full           Yes      Yes                                      +---------+---------------+---------+-----------+----------+-------------------+ FV Mid   Full           Yes      Yes                                      +---------+---------------+---------+-----------+----------+-------------------+ FV DistalFull           Yes      Yes                                      +---------+---------------+---------+-----------+----------+-------------------+ PFV      Full                                                             +---------+---------------+---------+-----------+----------+-------------------+ POP      Full           Yes      Yes                                      +---------+---------------+---------+-----------+----------+-------------------+ PTV      Full                                                             +---------+---------------+---------+-----------+----------+-------------------+ PERO     Full                                         Not well visualized +---------+---------------+---------+-----------+----------+-------------------+   +---------+---------------+---------+-----------+----------+--------------+ LEFT     CompressibilityPhasicitySpontaneityPropertiesThrombus Aging +---------+---------------+---------+-----------+----------+--------------+ CFV      Full           Yes      Yes                                 +---------+---------------+---------+-----------+----------+--------------+ SFJ      Full                                                         +---------+---------------+---------+-----------+----------+--------------+  FV Prox  Partial        Yes      Yes                  Acute          +---------+---------------+---------+-----------+----------+--------------+ FV Mid   None           No       No                   Acute          +---------+---------------+---------+-----------+----------+--------------+ FV DistalNone           No       No                   Acute          +---------+---------------+---------+-----------+----------+--------------+ PFV      Full                                                        +---------+---------------+---------+-----------+----------+--------------+ POP      None           No       No                   Acute          +---------+---------------+---------+-----------+----------+--------------+ PTV      None           No       No                   Acute          +---------+---------------+---------+-----------+----------+--------------+ PERO     None           No       No                   Acute          +---------+---------------+---------+-----------+----------+--------------+ Gastroc  None           No       No                   Acute          +---------+---------------+---------+-----------+----------+--------------+     Summary: BILATERAL: -No evidence of popliteal cyst, bilaterally. RIGHT: - There is no evidence of deep vein thrombosis in the lower extremity. - There is no evidence of superficial venous thrombosis.  LEFT: - Findings consistent with acute deep vein thrombosis involving the left femoral vein, left popliteal vein, left posterior tibial veins, left peroneal veins, and left gastrocnemius veins. - There is no evidence of superficial venous thrombosis.  *See table(s) above for measurements and observations. Electronically signed by Deitra Mayo MD on 05/24/2020 at 3:42:20 PM.    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 3) anemia due to multiple myeloma becoming more apparent as the patient's hemoconcentration due to dehydration has been corrected.   4) acute renal  failure related to dehydration hypercalcemia and multiple myeloma.  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 - Continue f/u w Dr. Aris Lot on 05/03 as scheduled. -Advised pt that she may need a Port in future, especially given bothersome nature of her being a hard stick currently. The pt is now agreeable to this. -Discussed risk of holding blood thinner versus need for Port insertion. Will wait until end of C4 prior to inserting this. -Recommended pt wear compression socks (graded sports if medical grade are too bothersome) for swelling and keep legs elevated while sitting down and laying down.  -Advised pt that as the albumin improves, the swelling should also improve. -Recommended pt increase protein intake in diet. Recommended protein shake. -Continue Ergocalciferol twice weekly. -Advised pt that she can get 50000 IU Ergocalciferol on [facility] if cheaper than the pharmacy. Will send prescription in just in case pt chooses this option. -Continue Lasix at 40 mg. -Will increase Cytoxan dosage to 300 mg/m^2. Will monitor Hgb and Plt levels for potential transfusion needs.  -Will decrease Dexamethasone dosage from 40 to 20  mg from this cycle. -Advised pt that we will send out a MMP with next treatment (C4D8) to reflect the full three cycles of treatment. Will reassess after finishing C4 if additional cycles are needed and discuss with Dr. Aris Lot regarding timing and timeline. -Discussed goal of deepest response possible prior to transplant. -Advised pt that we usually stop treatment two weeks prior to stem cell collection. Will tentatively schedule C5 at this time. -Recommended pt continue to try to reduce breakthrough pain medication needs. -Advised pt we will increase Neurontin to 300 mg in morning and 600 mg at night prior to bedtime. -Recommended pt start OTC Vitamin B-Complex to reduce chances of neuropathy due to vitamin deficiencies.   FOLLOW UP: plz schedule C5 of CyBOrD as ordered Labs with D1 and D8 of each cycle MD visit with C5D1    All of the patients questions were answered with apparent satisfaction. The patient knows to call the clinic with any problems, questions or concerns.   The total time spent in the appointment was 30 minutes and more than 50% was on counseling and direct patient cares.       Sullivan Lone MD [place] AAHIVMS [facility] Hematology/Oncology Physician [facility]  (Office):       [phone] (Work cell):  [phone] (Fax):           [phone]  06/10/2020 10:08 AM  I, Reinaldo Raddle, am acting as scribe for Dr. Sullivan Lone, MD.   .I have reviewed the above documentation for accuracy and completeness, and I agree with the above. Brunetta Genera MD

## 2020-06-10 ENCOUNTER — Other Ambulatory Visit: Payer: Self-pay

## 2020-06-10 ENCOUNTER — Inpatient Hospital Stay: Payer: BC Managed Care – PPO

## 2020-06-10 ENCOUNTER — Inpatient Hospital Stay (HOSPITAL_BASED_OUTPATIENT_CLINIC_OR_DEPARTMENT_OTHER): Payer: BC Managed Care – PPO | Admitting: Hematology

## 2020-06-10 VITALS — BP 117/62 | HR 83 | Temp 98.8°F | Resp 18

## 2020-06-10 DIAGNOSIS — Z79899 Other long term (current) drug therapy: Secondary | ICD-10-CM | POA: Diagnosis not present

## 2020-06-10 DIAGNOSIS — C9000 Multiple myeloma not having achieved remission: Secondary | ICD-10-CM

## 2020-06-10 DIAGNOSIS — C7951 Secondary malignant neoplasm of bone: Secondary | ICD-10-CM

## 2020-06-10 DIAGNOSIS — I82412 Acute embolism and thrombosis of left femoral vein: Secondary | ICD-10-CM

## 2020-06-10 DIAGNOSIS — Z5111 Encounter for antineoplastic chemotherapy: Secondary | ICD-10-CM

## 2020-06-10 DIAGNOSIS — Z5112 Encounter for antineoplastic immunotherapy: Secondary | ICD-10-CM | POA: Diagnosis not present

## 2020-06-10 DIAGNOSIS — Z7189 Other specified counseling: Secondary | ICD-10-CM

## 2020-06-10 LAB — CBC WITH DIFFERENTIAL/PLATELET
Abs Immature Granulocytes: 0.04 10*3/uL (ref 0.00–0.07)
Basophils Absolute: 0 10*3/uL (ref 0.0–0.1)
Basophils Relative: 0 %
Eosinophils Absolute: 0.3 10*3/uL (ref 0.0–0.5)
Eosinophils Relative: 4 %
HCT: 30.4 % — ABNORMAL LOW (ref 36.0–46.0)
Hemoglobin: 9.5 g/dL — ABNORMAL LOW (ref 12.0–15.0)
Immature Granulocytes: 1 %
Lymphocytes Relative: 4 %
Lymphs Abs: 0.3 10*3/uL — ABNORMAL LOW (ref 0.7–4.0)
MCH: 31.7 pg (ref 26.0–34.0)
MCHC: 31.3 g/dL (ref 30.0–36.0)
MCV: 101.3 fL — ABNORMAL HIGH (ref 80.0–100.0)
Monocytes Absolute: 0.6 10*3/uL (ref 0.1–1.0)
Monocytes Relative: 9 %
Neutro Abs: 6.2 10*3/uL (ref 1.7–7.7)
Neutrophils Relative %: 82 %
Platelets: 286 10*3/uL (ref 150–400)
RBC: 3 MIL/uL — ABNORMAL LOW (ref 3.87–5.11)
RDW: 18.7 % — ABNORMAL HIGH (ref 11.5–15.5)
WBC: 7.4 10*3/uL (ref 4.0–10.5)
nRBC: 0 % (ref 0.0–0.2)

## 2020-06-10 LAB — CMP (CANCER CENTER ONLY)
ALT: 10 U/L (ref 0–44)
AST: 7 U/L — ABNORMAL LOW (ref 15–41)
Albumin: 3.2 g/dL — ABNORMAL LOW (ref 3.5–5.0)
Alkaline Phosphatase: 79 U/L (ref 38–126)
Anion gap: 10 (ref 5–15)
BUN: 16 mg/dL (ref 6–20)
CO2: 26 mmol/L (ref 22–32)
Calcium: 9.1 mg/dL (ref 8.9–10.3)
Chloride: 104 mmol/L (ref 98–111)
Creatinine: 0.69 mg/dL (ref 0.44–1.00)
GFR, Estimated: >60 mL/min (ref >60)
Glucose, Bld: 106 mg/dL — ABNORMAL HIGH (ref 70–99)
Potassium: 3.7 mmol/L (ref 3.5–5.1)
Sodium: 140 mmol/L (ref 135–145)
Total Bilirubin: 0.3 mg/dL (ref 0.3–1.2)
Total Protein: 6 g/dL — ABNORMAL LOW (ref 6.5–8.1)

## 2020-06-10 MED ORDER — GABAPENTIN 300 MG PO CAPS: ORAL_CAPSULE | ORAL | 1 refills | Status: DC

## 2020-06-10 MED ORDER — DEXAMETHASONE SODIUM PHOSPHATE 100 MG/10ML IJ SOLN
20.0000 mg | Freq: Once | INTRAMUSCULAR | Status: AC
  Administered 2020-06-10: 20 mg via INTRAVENOUS
  Filled 2020-06-10: qty 20

## 2020-06-10 MED ORDER — ERGOCALCIFEROL 1.25 MG (50000 UT) PO CAPS: 50000.0000 [IU] | ORAL_CAPSULE | ORAL | 2 refills | Status: DC

## 2020-06-10 MED ORDER — PALONOSETRON HCL INJECTION 0.25 MG/5ML
0.2500 mg | Freq: Once | INTRAVENOUS | Status: AC
  Administered 2020-06-10: 0.25 mg via INTRAVENOUS

## 2020-06-10 MED ORDER — BORTEZOMIB CHEMO SQ INJECTION 3.5 MG (2.5MG/ML)
1.3000 mg/m2 | Freq: Once | INTRAMUSCULAR | Status: AC
  Administered 2020-06-10: 2.75 mg via SUBCUTANEOUS
  Filled 2020-06-10: qty 1.1

## 2020-06-10 MED ORDER — SODIUM CHLORIDE 0.9 % IV SOLN
Freq: Once | INTRAVENOUS | Status: AC
  Filled 2020-06-10: qty 250

## 2020-06-10 MED ORDER — SODIUM CHLORIDE 0.9 % IV SOLN
300.0000 mg/m2 | Freq: Once | INTRAVENOUS | Status: AC
  Administered 2020-06-10: 660 mg via INTRAVENOUS
  Filled 2020-06-10: qty 33

## 2020-06-10 MED ORDER — B COMPLEX VITAMINS PO CAPS: 1.0000 | ORAL_CAPSULE | Freq: Every day | ORAL | Status: AC

## 2020-06-10 MED ORDER — PALONOSETRON HCL INJECTION 0.25 MG/5ML
INTRAVENOUS | Status: AC
  Filled 2020-06-10: qty 5

## 2020-06-10 NOTE — Patient Instructions (Signed)
[facility] MEDICAL ONCOLOGY  Discharge Instructions: ?Thank you for choosing [facility] to provide your oncology and hematology care.  ? ?If you have a lab appointment with the Cancer Center, please go directly to the Cancer Center and check in at the registration area. ?  ?Wear comfortable clothing and clothing appropriate for easy access to any Portacath or PICC line.  ? ?We strive to give you quality time with your provider. You may need to reschedule your appointment if you arrive late (15 or more minutes).  Arriving late affects you and other patients whose appointments are after yours.  Also, if you miss three or more appointments without notifying the office, you may be dismissed from the clinic at the provider?s discretion.    ?  ?For prescription refill requests, have your pharmacy contact our office and allow 72 hours for refills to be completed.   ? ?Today you received the following chemotherapy and/or immunotherapy agents cytoxan    ?  ?To help prevent nausea and vomiting after your treatment, we encourage you to take your nausea medication as directed. ? ?BELOW ARE SYMPTOMS THAT SHOULD BE REPORTED IMMEDIATELY: ?*FEVER GREATER THAN 100.4 F (38 ?C) OR HIGHER ?*CHILLS OR SWEATING ?*NAUSEA AND VOMITING THAT IS NOT CONTROLLED WITH YOUR NAUSEA MEDICATION ?*UNUSUAL SHORTNESS OF BREATH ?*UNUSUAL BRUISING OR BLEEDING ?*URINARY PROBLEMS (pain or burning when urinating, or frequent urination) ?*BOWEL PROBLEMS (unusual diarrhea, constipation, pain near the anus) ?TENDERNESS IN MOUTH AND THROAT WITH OR WITHOUT PRESENCE OF ULCERS (sore throat, sores in mouth, or a toothache) ?UNUSUAL RASH, SWELLING OR PAIN  ?UNUSUAL VAGINAL DISCHARGE OR ITCHING  ? ?Items with * indicate a potential emergency and should be followed up as soon as possible or go to the Emergency Department if any problems should occur. ? ?Please show the CHEMOTHERAPY ALERT CARD or IMMUNOTHERAPY ALERT CARD at check-in to the  Emergency Department and triage nurse. ? ?Should you have questions after your visit or need to cancel or reschedule your appointment, please contact [facility] MEDICAL ONCOLOG[facility]  Dept: [phone]  and follow the prompts.  Office hours are 8:00 a.m. to 4:30 p.m. Monday - Friday. Please note that voicemails left after 4:00 p.m. may not be returned until the following business day.  We are closed weekends and major holidays. You have access to a nurse at all times for urgent questions. Please call the main number to the clinic Dept: [phone] and follow the prompts. ? ? ?For any non-urgent questions, you may also contact your provider using MyChart. We now offer e-Visits for anyone 18 and older to request care online for non-urgent symptoms. For details visit [email or web address]. ?  ?Also download the MyChart app! Go to the app store, search "MyChart", open the app, select [place], and log in with your MyChart username and password. ? ?Due to Covid, a mask is required upon entering the hospital/clinic. If you do not have a mask, one will be given to you upon arrival. For doctor visits, patients may have 1 support person aged 18 or older with them. For treatment visits, patients cannot have anyone with them due to current Covid guidelines and our immunocompromised population.  ? ?

## 2020-06-13 ENCOUNTER — Other Ambulatory Visit: Payer: Self-pay | Admitting: *Deleted

## 2020-06-13 ENCOUNTER — Inpatient Hospital Stay: Payer: BC Managed Care – PPO

## 2020-06-13 ENCOUNTER — Other Ambulatory Visit: Payer: Self-pay

## 2020-06-13 VITALS — BP 105/50 | HR 83 | Temp 98.2°F | Resp 18 | Wt 252.0 lb

## 2020-06-13 DIAGNOSIS — Z7189 Other specified counseling: Secondary | ICD-10-CM

## 2020-06-13 DIAGNOSIS — C9000 Multiple myeloma not having achieved remission: Secondary | ICD-10-CM

## 2020-06-13 DIAGNOSIS — Z5112 Encounter for antineoplastic immunotherapy: Secondary | ICD-10-CM | POA: Diagnosis not present

## 2020-06-13 DIAGNOSIS — C7951 Secondary malignant neoplasm of bone: Secondary | ICD-10-CM

## 2020-06-13 DIAGNOSIS — Z79899 Other long term (current) drug therapy: Secondary | ICD-10-CM | POA: Diagnosis not present

## 2020-06-13 MED ORDER — OXYCODONE HCL 5 MG PO TABS: 5.0000 mg | ORAL_TABLET | Freq: Four times a day (QID) | ORAL | 0 refills | Status: DC | PRN

## 2020-06-13 MED ORDER — BORTEZOMIB CHEMO SQ INJECTION 3.5 MG (2.5MG/ML)
1.3000 mg/m2 | Freq: Once | INTRAMUSCULAR | Status: AC
  Administered 2020-06-13: 2.75 mg via SUBCUTANEOUS
  Filled 2020-06-13: qty 1.1

## 2020-06-13 MED ORDER — PROCHLORPERAZINE MALEATE 10 MG PO TABS: 10.0000 mg | ORAL_TABLET | Freq: Once | ORAL | Status: DC

## 2020-06-13 NOTE — Patient Instructions (Signed)
[facility] MEDICAL ONCOLOGY   ?Discharge Instructions: ?Thank you for choosing [facility] to provide your oncology and hematology care.  ? ?If you have a lab appointment with the Cancer Center, please go directly to the Cancer Center and check in at the registration area. ?  ?Wear comfortable clothing and clothing appropriate for easy access to any Portacath or PICC line.  ? ?We strive to give you quality time with your provider. You may need to reschedule your appointment if you arrive late (15 or more minutes).  Arriving late affects you and other patients whose appointments are after yours.  Also, if you miss three or more appointments without notifying the office, you may be dismissed from the clinic at the provider?s discretion.    ?  ?For prescription refill requests, have your pharmacy contact our office and allow 72 hours for refills to be completed.   ? ?Today you received the following chemotherapy and/or immunotherapy agents: bortezomib    ?  ?To help prevent nausea and vomiting after your treatment, we encourage you to take your nausea medication as directed. ? ?BELOW ARE SYMPTOMS THAT SHOULD BE REPORTED IMMEDIATELY: ?*FEVER GREATER THAN 100.4 F (38 ?C) OR HIGHER ?*CHILLS OR SWEATING ?*NAUSEA AND VOMITING THAT IS NOT CONTROLLED WITH YOUR NAUSEA MEDICATION ?*UNUSUAL SHORTNESS OF BREATH ?*UNUSUAL BRUISING OR BLEEDING ?*URINARY PROBLEMS (pain or burning when urinating, or frequent urination) ?*BOWEL PROBLEMS (unusual diarrhea, constipation, pain near the anus) ?TENDERNESS IN MOUTH AND THROAT WITH OR WITHOUT PRESENCE OF ULCERS (sore throat, sores in mouth, or a toothache) ?UNUSUAL RASH, SWELLING OR PAIN  ?UNUSUAL VAGINAL DISCHARGE OR ITCHING  ? ?Items with * indicate a potential emergency and should be followed up as soon as possible or go to the Emergency Department if any problems should occur. ? ?Please show the CHEMOTHERAPY ALERT CARD or IMMUNOTHERAPY ALERT CARD at check-in  to the Emergency Department and triage nurse. ? ?Should you have questions after your visit or need to cancel or reschedule your appointment, please contact [facility]COL[facility]  Dept: [phone]  and follow the prompts.  Office hours are 8:00 a.m. to 4:30 p.m. Monday - Friday. Please note that voicemails left after 4:00 p.m. may not be returned until the following business day.  We are closed weekends and major holidays. You have access to a nurse at all times for urgent questions. Please call the main number to the clinic Dept: [phone] and follow the prompts. ? ? ?For any non-urgent questions, you may also contact your provider using MyChart. We now offer e-Visits for anyone 18 and older to request care online for non-urgent symptoms. For details visit [email or web address]. ?  ?Also download the MyChart app! Go to the app store, search "MyChart", open the app, select [place], and log in with your MyChart username and password. ? ?Due to Covid, a mask is required upon entering the hospital/clinic. If you do not have a mask, one will be given to you upon arrival. For doctor visits, patients may have 1 support person aged 18 or older with them. For treatment visits, patients cannot have anyone with them due to current Covid guidelines and our immunocompromised population.  ? ?

## 2020-06-17 ENCOUNTER — Other Ambulatory Visit: Payer: Self-pay

## 2020-06-17 ENCOUNTER — Inpatient Hospital Stay: Payer: BC Managed Care – PPO | Attending: Hematology

## 2020-06-17 ENCOUNTER — Inpatient Hospital Stay: Payer: BC Managed Care – PPO

## 2020-06-17 VITALS — BP 110/59 | HR 80 | Temp 98.4°F | Resp 17 | Wt 251.2 lb

## 2020-06-17 DIAGNOSIS — Z5111 Encounter for antineoplastic chemotherapy: Secondary | ICD-10-CM | POA: Insufficient documentation

## 2020-06-17 DIAGNOSIS — Z5112 Encounter for antineoplastic immunotherapy: Secondary | ICD-10-CM | POA: Diagnosis not present

## 2020-06-17 DIAGNOSIS — C9000 Multiple myeloma not having achieved remission: Secondary | ICD-10-CM | POA: Diagnosis not present

## 2020-06-17 DIAGNOSIS — Z79899 Other long term (current) drug therapy: Secondary | ICD-10-CM | POA: Diagnosis not present

## 2020-06-17 DIAGNOSIS — Z7189 Other specified counseling: Secondary | ICD-10-CM

## 2020-06-17 LAB — CBC WITH DIFFERENTIAL/PLATELET
Abs Immature Granulocytes: 0.03 10*3/uL (ref 0.00–0.07)
Basophils Absolute: 0 10*3/uL (ref 0.0–0.1)
Basophils Relative: 0 %
Eosinophils Absolute: 0.2 10*3/uL (ref 0.0–0.5)
Eosinophils Relative: 4 %
HCT: 32.1 % — ABNORMAL LOW (ref 36.0–46.0)
Hemoglobin: 10 g/dL — ABNORMAL LOW (ref 12.0–15.0)
Immature Granulocytes: 1 %
Lymphocytes Relative: 5 %
Lymphs Abs: 0.3 10*3/uL — ABNORMAL LOW (ref 0.7–4.0)
MCH: 31.6 pg (ref 26.0–34.0)
MCHC: 31.2 g/dL (ref 30.0–36.0)
MCV: 101.6 fL — ABNORMAL HIGH (ref 80.0–100.0)
Monocytes Absolute: 0.5 10*3/uL (ref 0.1–1.0)
Monocytes Relative: 9 %
Neutro Abs: 3.9 10*3/uL (ref 1.7–7.7)
Neutrophils Relative %: 81 %
Platelets: 181 10*3/uL (ref 150–400)
RBC: 3.16 MIL/uL — ABNORMAL LOW (ref 3.87–5.11)
RDW: 18.6 % — ABNORMAL HIGH (ref 11.5–15.5)
WBC: 4.9 10*3/uL (ref 4.0–10.5)
nRBC: 0 % (ref 0.0–0.2)

## 2020-06-17 LAB — CMP (CANCER CENTER ONLY)
ALT: 10 U/L (ref 0–44)
AST: 11 U/L — ABNORMAL LOW (ref 15–41)
Albumin: 3.2 g/dL — ABNORMAL LOW (ref 3.5–5.0)
Alkaline Phosphatase: 74 U/L (ref 38–126)
Anion gap: 11 (ref 5–15)
BUN: 13 mg/dL (ref 6–20)
CO2: 28 mmol/L (ref 22–32)
Calcium: 9.1 mg/dL (ref 8.9–10.3)
Chloride: 102 mmol/L (ref 98–111)
Creatinine: 0.72 mg/dL (ref 0.44–1.00)
GFR, Estimated: >60 mL/min (ref >60)
Glucose, Bld: 102 mg/dL — ABNORMAL HIGH (ref 70–99)
Potassium: 3.5 mmol/L (ref 3.5–5.1)
Sodium: 141 mmol/L (ref 135–145)
Total Bilirubin: 0.3 mg/dL (ref 0.3–1.2)
Total Protein: 6.2 g/dL — ABNORMAL LOW (ref 6.5–8.1)

## 2020-06-17 MED ORDER — SODIUM CHLORIDE 0.9 % IV SOLN
300.0000 mg/m2 | Freq: Once | INTRAVENOUS | Status: AC
  Administered 2020-06-17: 660 mg via INTRAVENOUS
  Filled 2020-06-17: qty 33

## 2020-06-17 MED ORDER — SODIUM CHLORIDE 0.9 % IV SOLN
Freq: Once | INTRAVENOUS | Status: AC
  Filled 2020-06-17: qty 250

## 2020-06-17 MED ORDER — SODIUM CHLORIDE 0.9 % IV SOLN
20.0000 mg | Freq: Once | INTRAVENOUS | Status: AC
  Administered 2020-06-17: 20 mg via INTRAVENOUS
  Filled 2020-06-17: qty 20

## 2020-06-17 MED ORDER — PALONOSETRON HCL INJECTION 0.25 MG/5ML
INTRAVENOUS | Status: AC
  Filled 2020-06-17: qty 5

## 2020-06-17 MED ORDER — BORTEZOMIB CHEMO SQ INJECTION 3.5 MG (2.5MG/ML)
1.3000 mg/m2 | Freq: Once | INTRAMUSCULAR | Status: AC
  Administered 2020-06-17: 2.75 mg via SUBCUTANEOUS
  Filled 2020-06-17: qty 1.1

## 2020-06-17 MED ORDER — PALONOSETRON HCL INJECTION 0.25 MG/5ML
0.2500 mg | Freq: Once | INTRAVENOUS | Status: AC
  Administered 2020-06-17: 0.25 mg via INTRAVENOUS

## 2020-06-17 NOTE — Patient Instructions (Signed)
[facility]  Discharge Instructions: °Thank you for choosing [facility] to provide your oncology and hematology care.  ° °If you have a lab appointment with the Cancer Center, please go directly to the Cancer Center and check in at the registration area. °  °Wear comfortable clothing and clothing appropriate for easy access to any Portacath or PICC line.  ° °We strive to give you quality time with your provider. You may need to reschedule your appointment if you arrive late (15 or more minutes).  Arriving late affects you and other patients whose appointments are after yours.  Also, if you miss three or more appointments without notifying the office, you may be dismissed from the clinic at the provider’s discretion.    °  °For prescription refill requests, have your pharmacy contact our office and allow 72 hours for refills to be completed.   ° °Today you received the following chemotherapy and/or immunotherapy agents: Cytoxan & Velcade    °  °To help prevent nausea and vomiting after your treatment, we encourage you to take your nausea medication as directed. ° °BELOW ARE SYMPTOMS THAT SHOULD BE REPORTED IMMEDIATELY: °*FEVER GREATER THAN 100.4 F (38 °C) OR HIGHER °*CHILLS OR SWEATING °*NAUSEA AND VOMITING THAT IS NOT CONTROLLED WITH YOUR NAUSEA MEDICATION °*UNUSUAL SHORTNESS OF BREATH °*UNUSUAL BRUISING OR BLEEDING °*URINARY PROBLEMS (pain or burning when urinating, or frequent urination) °*BOWEL PROBLEMS (unusual diarrhea, constipation, pain near the anus) °TENDERNESS IN MOUTH AND THROAT WITH OR WITHOUT PRESENCE OF ULCERS (sore throat, sores in mouth, or a toothache) °UNUSUAL RASH, SWELLING OR PAIN  °UNUSUAL VAGINAL DISCHARGE OR ITCHING  ° °Items with * indicate a potential emergency and should be followed up as soon as possible or go to the Emergency Department if any problems should occur. ° °Please show the CHEMOTHERAPY ALERT CARD or IMMUNOTHERAPY ALERT CARD at  check-in to the Emergency Department and triage nurse. ° °Should you have questions after your visit or need to cancel or reschedule your appointment, please contact [facility]  Dept: [phone]  and follow the prompts.  Office hours are 8:00 a.m. to 4:30 p.m. Monday - Friday. Please note that voicemails left after 4:00 p.m. may not be returned until the following business day.  We are closed weekends and major holidays. You have access to a nurse at all times for urgent questions. Please call the main number to the clinic Dept: [phone] and follow the prompts. ° ° °For any non-urgent questions, you may also contact your provider using MyChart. We now offer e-Visits for anyone 18 and older to request care online for non-urgent symptoms. For details visit [email or web address]. °  °Also download the MyChart app! Go to the app store, search "MyChart", open the app, select [facility], and log in with your MyChart username and password. ° °Due to Covid, a mask is required upon entering the hospital/clinic. If you do not have a mask, one will be given to you upon arrival. For doctor visits, patients may have 1 support person aged 18 or older with them. For treatment visits, patients cannot have anyone with them due to current Covid guidelines and our immunocompromised population.  ° °

## 2020-06-18 ENCOUNTER — Telehealth: Payer: Self-pay | Admitting: Hematology

## 2020-06-18 DIAGNOSIS — C9000 Multiple myeloma not having achieved remission: Secondary | ICD-10-CM | POA: Diagnosis not present

## 2020-06-18 DIAGNOSIS — Z01818 Encounter for other preprocedural examination: Secondary | ICD-10-CM | POA: Diagnosis not present

## 2020-06-18 DIAGNOSIS — M25559 Pain in unspecified hip: Secondary | ICD-10-CM | POA: Diagnosis not present

## 2020-06-18 LAB — KAPPA/LAMBDA LIGHT CHAINS
Kappa free light chain: 6.3 mg/L (ref 3.3–19.4)
Kappa, lambda light chain ratio: 1.66 — ABNORMAL HIGH (ref 0.26–1.65)
Lambda free light chains: 3.8 mg/L — ABNORMAL LOW (ref 5.7–26.3)

## 2020-06-18 NOTE — Telephone Encounter (Signed)
Scheduled appts per 5/1 sch msg. Called pt, no answer. Left msg with appts dates and times.

## 2020-06-19 ENCOUNTER — Other Ambulatory Visit: Payer: Self-pay | Admitting: Hematology

## 2020-06-19 DIAGNOSIS — R609 Edema, unspecified: Secondary | ICD-10-CM

## 2020-06-19 DIAGNOSIS — C7951 Secondary malignant neoplasm of bone: Secondary | ICD-10-CM

## 2020-06-19 DIAGNOSIS — C9000 Multiple myeloma not having achieved remission: Secondary | ICD-10-CM

## 2020-06-20 ENCOUNTER — Other Ambulatory Visit: Payer: Self-pay

## 2020-06-20 ENCOUNTER — Inpatient Hospital Stay: Payer: BC Managed Care – PPO

## 2020-06-20 VITALS — BP 112/62 | HR 78 | Temp 98.2°F | Resp 18

## 2020-06-20 DIAGNOSIS — Z5112 Encounter for antineoplastic immunotherapy: Secondary | ICD-10-CM | POA: Diagnosis not present

## 2020-06-20 DIAGNOSIS — Z7189 Other specified counseling: Secondary | ICD-10-CM

## 2020-06-20 DIAGNOSIS — C9000 Multiple myeloma not having achieved remission: Secondary | ICD-10-CM | POA: Diagnosis not present

## 2020-06-20 DIAGNOSIS — Z79899 Other long term (current) drug therapy: Secondary | ICD-10-CM | POA: Diagnosis not present

## 2020-06-20 DIAGNOSIS — Z5111 Encounter for antineoplastic chemotherapy: Secondary | ICD-10-CM | POA: Diagnosis not present

## 2020-06-20 LAB — MULTIPLE MYELOMA PANEL, SERUM
Albumin SerPl Elph-Mcnc: 3.3 g/dL (ref 2.9–4.4)
Albumin/Glob SerPl: 1.4 (ref 0.7–1.7)
Alpha 1: 0.3 g/dL (ref 0.0–0.4)
Alpha2 Glob SerPl Elph-Mcnc: 0.8 g/dL (ref 0.4–1.0)
B-Globulin SerPl Elph-Mcnc: 1 g/dL (ref 0.7–1.3)
Gamma Glob SerPl Elph-Mcnc: 0.3 g/dL — ABNORMAL LOW (ref 0.4–1.8)
Globulin, Total: 2.4 g/dL (ref 2.2–3.9)
IgA: 30 mg/dL — ABNORMAL LOW (ref 87–352)
IgG (Immunoglobin G), Serum: 396 mg/dL — ABNORMAL LOW (ref 586–1602)
IgM (Immunoglobulin M), Srm: 7 mg/dL — ABNORMAL LOW (ref 26–217)
M Protein SerPl Elph-Mcnc: 0.2 g/dL — ABNORMAL HIGH
Total Protein ELP: 5.7 g/dL — ABNORMAL LOW (ref 6.0–8.5)

## 2020-06-20 MED ORDER — BORTEZOMIB CHEMO SQ INJECTION 3.5 MG (2.5MG/ML)
1.3000 mg/m2 | Freq: Once | INTRAMUSCULAR | Status: AC
  Administered 2020-06-20: 2.75 mg via SUBCUTANEOUS
  Filled 2020-06-20: qty 1.1

## 2020-06-20 MED ORDER — PROCHLORPERAZINE MALEATE 10 MG PO TABS: 10.0000 mg | ORAL_TABLET | Freq: Once | ORAL | Status: DC

## 2020-06-21 ENCOUNTER — Other Ambulatory Visit: Payer: Self-pay | Admitting: Hematology and Oncology

## 2020-06-21 DIAGNOSIS — C9000 Multiple myeloma not having achieved remission: Secondary | ICD-10-CM

## 2020-06-21 DIAGNOSIS — Z7189 Other specified counseling: Secondary | ICD-10-CM

## 2020-06-24 ENCOUNTER — Other Ambulatory Visit: Payer: Self-pay | Admitting: Hematology

## 2020-06-24 ENCOUNTER — Inpatient Hospital Stay: Payer: BC Managed Care – PPO

## 2020-06-24 ENCOUNTER — Other Ambulatory Visit: Payer: Self-pay

## 2020-06-24 VITALS — BP 115/57 | HR 76 | Temp 98.3°F | Resp 18 | Wt 261.1 lb

## 2020-06-24 DIAGNOSIS — C9000 Multiple myeloma not having achieved remission: Secondary | ICD-10-CM

## 2020-06-24 DIAGNOSIS — C7951 Secondary malignant neoplasm of bone: Secondary | ICD-10-CM

## 2020-06-24 DIAGNOSIS — Z5111 Encounter for antineoplastic chemotherapy: Secondary | ICD-10-CM | POA: Diagnosis not present

## 2020-06-24 DIAGNOSIS — Z79899 Other long term (current) drug therapy: Secondary | ICD-10-CM | POA: Diagnosis not present

## 2020-06-24 DIAGNOSIS — Z7189 Other specified counseling: Secondary | ICD-10-CM

## 2020-06-24 DIAGNOSIS — Z5112 Encounter for antineoplastic immunotherapy: Secondary | ICD-10-CM | POA: Diagnosis not present

## 2020-06-24 MED ORDER — ZOLEDRONIC ACID 4 MG/100ML IV SOLN
INTRAVENOUS | Status: AC
  Filled 2020-06-24: qty 100

## 2020-06-24 MED ORDER — APIXABAN 5 MG PO TABS: 5.0000 mg | ORAL_TABLET | Freq: Two times a day (BID) | ORAL | 2 refills | Status: DC

## 2020-06-24 MED ORDER — ZOLEDRONIC ACID 4 MG/100ML IV SOLN
4.0000 mg | Freq: Once | INTRAVENOUS | Status: AC
  Administered 2020-06-24: 4 mg via INTRAVENOUS

## 2020-06-24 MED ORDER — LORAZEPAM 0.5 MG PO TABS: ORAL_TABLET | ORAL | 0 refills | Status: DC

## 2020-06-24 MED ORDER — SODIUM CHLORIDE 0.9 % IV SOLN
Freq: Once | INTRAVENOUS | Status: AC
  Filled 2020-06-24: qty 250

## 2020-06-24 NOTE — Addendum Note (Signed)
Addended by: Sullivan Lone on: 06/24/2020 12:42 PM   Modules accepted: Orders

## 2020-06-24 NOTE — Patient Instructions (Signed)
[facility]  Discharge Instructions: Thank you for choosing [facility] to provide your oncology and hematology care.   If you have a lab appointment with the [facility], please go directly to the [facility] and check in at the registration area.   Wear comfortable clothing and clothing appropriate for easy access to any Portacath or PICC line.   We strive to give you quality time with your provider. You may need to reschedule your appointment if you arrive late (15 or more minutes).  Arriving late affects you and other patients whose appointments are after yours.  Also, if you miss three or more appointments without notifying the office, you may be dismissed from the clinic at the provider's discretion.      For prescription refill requests, have your pharmacy contact our office and allow 72 hours for refills to be completed.  Today you received the following chemotherapy and/or immunotherapy agents Zometa      To help prevent nausea and vomiting after your treatment, we encourage you to take your nausea medication as directed.  BELOW ARE SYMPTOMS THAT SHOULD BE REPORTED IMMEDIATELY: . *FEVER GREATER THAN 100.4 F (38 C) OR HIGHER . *CHILLS OR SWEATING . *NAUSEA AND VOMITING THAT IS NOT CONTROLLED WITH YOUR NAUSEA MEDICATION . *UNUSUAL SHORTNESS OF BREATH . *UNUSUAL BRUISING OR BLEEDING . *URINARY PROBLEMS (pain or burning when urinating, or frequent urination) . *BOWEL PROBLEMS (unusual diarrhea, constipation, pain near the anus) . TENDERNESS IN MOUTH AND THROAT WITH OR WITHOUT PRESENCE OF ULCERS (sore throat, sores in mouth, or a toothache) . UNUSUAL RASH, SWELLING OR PAIN  . UNUSUAL VAGINAL DISCHARGE OR ITCHING   Items with * indicate a potential emergency and should be followed up as soon as possible or go to the Emergency Department if any problems should occur.  Please show the CHEMOTHERAPY ALERT CARD or IMMUNOTHERAPY ALERT CARD at  check-in to the Emergency Department and triage nurse.  Should you have questions after your visit or need to cancel or reschedule your appointment, please contact [facility]  Dept: [phone]  and follow the prompts.  Office hours are 8:00 a.m. to 4:30 p.m. Monday - Friday. Please note that voicemails left after 4:00 p.m. may not be returned until the following business day.  We are closed weekends and major holidays. You have access to a nurse at all times for urgent questions. Please call the main number to the clinic Dept: [phone] and follow the prompts.   For any non-urgent questions, you may also contact your provider using MyChart. We now offer e-Visits for anyone 9 and older to request care online for non-urgent symptoms. For details visit [email or web address].   Also download the MyChart app! Go to the app store, search "MyChart", open the app, select [facility], and log in with your MyChart username and password.  Due to Covid, a mask is required upon entering the hospital/clinic. If you do not have a mask, one will be given to you upon arrival. For doctor visits, patients may have 1 support person aged 65 or older with them. For treatment visits, patients cannot have anyone with them due to current Covid guidelines and our immunocompromised population.  Zoledronic Acid Injection (Hypercalcemia, Oncology) What is this medicine?   ZOLEDRONIC ACID (ZOE le dron ik AS id) slows calcium loss from bones. It high calcium levels in the blood from some kinds of cancer. It may be used in other  people at risk for bone loss. This medicine may be used for other purposes; ask your health care provider or pharmacist if you have questions. COMMON BRAND NAME(S): Zometa What should I tell my health care provider before I take this medicine? They need to know if you have any of these conditions:  cancer  dehydration  dental disease  kidney disease  liver  disease  low levels of calcium in the blood  lung or breathing disease (asthma)  receiving steroids like dexamethasone or prednisone  an unusual or allergic reaction to zoledronic acid, other medicines, foods, dyes, or preservatives  pregnant or trying to get pregnant  breast-feeding How should I use this medicine? This drug is injected into a vein. It is given by a health care provider in a hospital or clinic setting. Talk to your health care provider about the use of this drug in children. Special care may be needed. Overdosage: If you think you have taken too much of this medicine contact a poison control center or emergency room at once. NOTE: This medicine is only for you. Do not share this medicine with others. What if I miss a dose? Keep appointments for follow-up doses. It is important not to miss your dose. Call your health care provider if you are unable to keep an appointment. What may interact with this medicine?  certain antibiotics given by injection  NSAIDs, medicines for pain and inflammation, like ibuprofen or naproxen  some diuretics like bumetanide, furosemide  teriparatide  thalidomide This list may not describe all possible interactions. Give your health care provider a list of all the medicines, herbs, non-prescription drugs, or dietary supplements you use. Also tell them if you smoke, drink alcohol, or use illegal drugs. Some items may interact with your medicine. What should I watch for while using this medicine? Visit your health care provider for regular checks on your progress. It may be some time before you see the benefit from this drug. Some people who take this drug have severe bone, joint, or muscle pain. This drug may also increase your risk for jaw problems or a broken thigh bone. Tell your health care provider right away if you have severe pain in your jaw, bones, joints, or muscles. Tell you health care provider if you have any pain that does not  go away or that gets worse. Tell your dentist and dental surgeon that you are taking this drug. You should not have major dental surgery while on this drug. See your dentist to have a dental exam and fix any dental problems before starting this drug. Take good care of your teeth while on this drug. Make sure you see your dentist for regular follow-up appointments. You should make sure you get enough calcium and vitamin D while you are taking this drug. Discuss the foods you eat and the vitamins you take with your health care provider. Check with your health care provider if you have severe diarrhea, nausea, and vomiting, or if you sweat a lot. The loss of too much body fluid may make it dangerous for you to take this drug. You may need blood work done while you are taking this drug. Do not become pregnant while taking this drug. Women should inform their health care provider if they wish to become pregnant or think they might be pregnant. There is potential for serious harm to an unborn child. Talk to your health care provider for more information. What side effects may I notice from receiving  this medicine? Side effects that you should report to your doctor or health care provider as soon as possible:  allergic reactions (skin rash, itching or hives; swelling of the face, lips, or tongue)  bone pain  infection (fever, chills, cough, sore throat, pain or trouble passing urine)  jaw pain, especially after dental work  joint pain  kidney injury (trouble passing urine or change in the amount of urine)  low blood pressure (dizziness; feeling faint or lightheaded, falls; unusually weak or tired)  low calcium levels (fast heartbeat; muscle cramps or pain; pain, tingling, or numbness in the hands or feet; seizures)  low magnesium levels (fast, irregular heartbeat; muscle cramp or pain; muscle weakness; tremors; seizures)  low red blood cell counts (trouble breathing; feeling faint; lightheaded,  falls; unusually weak or tired)  muscle pain  redness, blistering, peeling, or loosening of the skin, including inside the mouth  severe diarrhea  swelling of the ankles, feet, hands  trouble breathing Side effects that usually do not require medical attention (report to your doctor or health care provider if they continue or are bothersome):  anxious  constipation  coughing  depressed mood  eye irritation, itching, or pain  fever  general ill feeling or flu-like symptoms  nausea  pain, redness, or irritation at site where injected  trouble sleeping This list may not describe all possible side effects. Call your doctor for medical advice about side effects. You may report side effects to FDA at 1-800-FDA-1088. Where should I keep my medicine? This drug is given in a hospital or clinic. It will not be stored at home. NOTE: This sheet is a summary. It may not cover all possible information. If you have questions about this medicine, talk to your doctor, pharmacist, or health care provider.  2021 [employer] (2018-11-17 09:13:00)

## 2020-06-26 ENCOUNTER — Other Ambulatory Visit: Payer: Self-pay | Admitting: Hematology

## 2020-06-26 DIAGNOSIS — R609 Edema, unspecified: Secondary | ICD-10-CM

## 2020-06-26 DIAGNOSIS — C7951 Secondary malignant neoplasm of bone: Secondary | ICD-10-CM

## 2020-06-26 DIAGNOSIS — C9000 Multiple myeloma not having achieved remission: Secondary | ICD-10-CM

## 2020-06-27 ENCOUNTER — Other Ambulatory Visit: Payer: Self-pay

## 2020-06-27 ENCOUNTER — Other Ambulatory Visit: Payer: Self-pay | Admitting: Hematology

## 2020-06-27 DIAGNOSIS — C9000 Multiple myeloma not having achieved remission: Secondary | ICD-10-CM

## 2020-06-27 MED ORDER — POTASSIUM CHLORIDE CRYS ER 20 MEQ PO TBCR: 20.0000 meq | EXTENDED_RELEASE_TABLET | Freq: Two times a day (BID) | ORAL | 1 refills | Status: DC

## 2020-06-27 NOTE — Progress Notes (Signed)
PT referral sent to [facility] in [place]. (Per pt's request)

## 2020-06-27 NOTE — Progress Notes (Unsigned)
ptoassiu

## 2020-06-29 NOTE — Progress Notes (Signed)
HEMATOLOGY/ONCOLOGY CLINIC NOTE  Date of Service: 07/01/2020  Patient Care Team: Celene Squibb, MD as PCP - General (Internal Medicine) Corrie Mckusick, DO as Consulting Physician (Interventional Radiology)  CHIEF COMPLAINTS/PURPOSE OF CONSULTATION:  Multiple Myeloma- continued mx  HISTORY OF PRESENTING ILLNESS:   Stacey Montoya is a wonderful 45 y.o. female who is here today for evaluation and management of  Myeloma. The patient's last visit with [place] was inpatient on 06/10/2020. The pt reports that she is doing well overall. We are joined today by her husband. She is here for C5D1 CyBorD.  The pt reports that her leg swelling has been improved with the increase in lasix recently. She had a visit with Dr. Aris Lot at the beginning of this month with labs that showed an m-protein at a stable 0.2. He believes the pt needed to be more active and independent prior to transplant due to reservations on mobility. The pt notes that her lower back is the source of her pain and decreasing her mobility. The pt notes much concerns regarding her visit to [facility] due to his bluntness and putting a timeline on her. She does not feel as though she was heard. The pt will continue to f/u on this and notes that she is still considering and desiring transplant. The pt notes that she will now be seeing a therapist. The pt notes that she feels as though she has hit a plateau in her progress and is desiring to work her way out of this. The pt notes that she feels soreness under her skin to the touch.   Lab results today 07/01/2020 of CBC w/diff and CMP is as follows: all values are WNL except for Glucose of 100, AST of 11, RBC of 3.47, Hgb of 10.9, HCT of 33.8, RDW of 17.6, Lymphs Abs of 0.3K.  On review of systems, pt reports continued lower back and hip pain, improving leg swelling, left abdominal pain  and denies new bone pains, changes in breathing, neuropathy, SOB, acute leg swelling, changes in bowel  habits, constipation, and any other symptoms.  MEDICAL HISTORY:  Past Medical History:  Diagnosis Date  . Abnormal Pap smear of cervix   . Back pain   . Hyperlipidemia     SURGICAL HISTORY: Past Surgical History:  Procedure Laterality Date  . CERVICAL CONIZATION W/BX  12/02/2010   Procedure: CONIZATION CERVIX WITH BIOPSY;  Surgeon: Arloa Koh;  Location: [facility];  Service: Gynecology;  Laterality: N/A;  COLD KNIFE  . CESAREAN SECTION    . DILATION AND CURETTAGE OF UTERUS  12/02/2010   Procedure: DILATATION AND CURETTAGE (D&C);  Surgeon: Arloa Koh;  Location: [facility];  Service: Gynecology;  Laterality: N/A;  . IR RADIOLOGIST EVAL & MGMT  04/16/2020  . left hand     drain - infection  . WISDOM TOOTH EXTRACTION      SOCIAL HISTORY: Social History   Socioeconomic History  . Marital status: Married    Spouse name: Not on file  . Number of children: Not on file  . Years of education: Not on file  . Highest education level: Not on file  Occupational History  . Not on file  Tobacco Use  . Smoking status: Current Every Day Smoker    Packs/day: 0.50    Years: 20.00    Pack years: 10.00    Types: Cigarettes  . Smokeless tobacco: Never Used  Vaping Use  . Vaping Use: Every day  Substance  and Sexual Activity  . Alcohol use: Not Currently    Comment: socially - 6 beers per month  . Drug use: Not Currently  . Sexual activity: Yes    Partners: Male    Birth control/protection: Condom  Other Topics Concern  . Not on file  Social History Narrative   ** Merged History Encounter **       Social Determinants of Health   Financial Resource Strain: Not on file  Food Insecurity: Not on file  Transportation Needs: Not on file  Physical Activity: Not on file  Stress: Not on file  Social Connections: Not on file  Intimate Partner Violence: Not on file    FAMILY HISTORY: Family History  Problem Relation Age of Onset  . Lung cancer Maternal Grandfather   . Pancreatic  cancer Paternal Grandfather     ALLERGIES:  is allergic to zithromax [azithromycin].  MEDICATIONS:  Current Outpatient Medications  Medication Sig Dispense Refill  . acyclovir (ZOVIRAX) 400 MG tablet Take 1 tablet (400 mg total) by mouth 2 (two) times daily. 60 tablet 3  . albuterol (VENTOLIN HFA) 108 (90 Base) MCG/ACT inhaler Inhale 1-2 puffs into the lungs as needed for wheezing.    Marland Kitchen amLODipine (NORVASC) 5 MG tablet Take 1 tablet (5 mg total) by mouth daily. 90 tablet 0  . apixaban (ELIQUIS) 5 MG TABS tablet Take 1 tablet (5 mg total) by mouth 2 (two) times daily. 60 tablet 2  . b complex vitamins capsule Take 1 capsule by mouth daily. 30 capsule   . cholecalciferol (VITAMIN D) 25 MCG tablet Take 2 tablets (2,000 Units total) by mouth daily. 180 tablet 0  . dexamethasone (DECADRON) 4 MG tablet TAKE 10 TABLETS ON DAY 15 OF EACH CYCLE. REPEAT EVERY 21 DAYS. TAKE WITH BREAKFAST. 10 tablet 0  . ergocalciferol (VITAMIN D2) 1.25 MG (50000 UT) capsule Take 1 capsule (50,000 Units total) by mouth 2 (two) times a week. 12 capsule 2  . furosemide (LASIX) 20 MG tablet TAKE (1) TABLET BY MOUTH TWICE DAILY. 60 tablet 0  . gabapentin (NEURONTIN) 300 MG capsule Plz take 359m (1 cap)PO in the morning and 6049m(2 caps) po at bedtime 180 capsule 1  . LORazepam (ATIVAN) 0.5 MG tablet TAKE (1) TABLET BY MOUTH EVERY SIX HOURS AS NEEDED. 30 tablet 0  . magic mouthwash w/lidocaine SOLN Dispense 40080mf magic mouthwash compounded preparation. Patient to use 5ml49mtimes daily as needed for throat discomfort. 80 ml viscous lidocaine 2% 80 ml Mylanta 80 ml diphenhydramine at 12.5 mg per 5 ml elixir 80 ml nystatin at 100,000U per 5 mL suspension 80 ml distilled water 400 mL 1  . medroxyPROGESTERone (PROVERA) 10 MG tablet daily.    . metoprolol tartrate (LOPRESSOR) 50 MG tablet Take 1 tablet (50 mg total) by mouth 2 (two) times daily. 180 tablet 0  . nicotine (NICODERM CQ - DOSED IN MG/24 HOURS) 21 mg/24hr patch  Place 1 patch (21 mg total) onto the skin daily. 28 patch 0  . norethindrone (MICRONOR) 0.35 MG tablet Take 1 tablet by mouth daily.    . norethindrone (MICRONOR,CAMILA,ERRIN) 0.35 MG tablet Take 1 tablet by mouth daily.    . ondansetron (ZOFRAN) 4 MG tablet Take 4 mg by mouth every 8 (eight) hours.    . ondansetron (ZOFRAN) 8 MG tablet Take 1 tablet (8 mg total) by mouth 2 (two) times daily as needed for refractory nausea / vomiting. Start on day 3 after Cytoxan. 30 tablet  1  . oxyCODONE (OXY IR/ROXICODONE) 5 MG immediate release tablet TAKE 1 OR 2 TABLETS BY MOUTH EVERY 6 HOURS AS NEEDED 60 tablet 0  . polyethylene glycol (MIRALAX / GLYCOLAX) 17 g packet Take 17 g by mouth daily. 14 each 0  . potassium chloride SA (KLOR-CON) 20 MEQ tablet Take 1 tablet (20 mEq total) by mouth 2 (two) times daily. 60 tablet 1  . prochlorperazine (COMPAZINE) 10 MG tablet Take 1 tablet (10 mg total) by mouth every 6 (six) hours as needed (Nausea or vomiting). 30 tablet 1   No current facility-administered medications for this visit.    REVIEW OF SYSTEMS:   10 Point review of Systems was done is negative except as noted above.  PHYSICAL EXAMINATION: ECOG PERFORMANCE STATUS: 2 - Symptomatic, <50% confined to bed VS stable  Exam was given in infusion.   GENERAL:alert, in no acute distress and comfortable SKIN: no acute rashes, no significant lesions EYES: conjunctiva are pink and non-injected, sclera anicteric OROPHARYNX: MMM, no exudates, no oropharyngeal erythema or ulceration NECK: supple, no JVD LYMPH:  no palpable lymphadenopathy in the cervical, axillary or inguinal regions LUNGS: clear to auscultation b/l with normal respiratory effort HEART: regular rate & rhythm ABDOMEN:  normoactive bowel sounds , non tender, not distended. Extremity: no pedal edema PSYCH: alert & oriented x 3 with fluent speech NEURO: no focal motor/sensory deficits   LABORATORY DATA:  I have reviewed the data as  listed  . CBC Latest Ref Rng & Units 07/01/2020 06/17/2020 06/10/2020  WBC 4.0 - 10.5 K/uL 6.4 4.9 7.4  Hemoglobin 12.0 - 15.0 g/dL 10.9(L) 10.0(L) 9.5(L)  Hematocrit 36.0 - 46.0 % 33.8(L) 32.1(L) 30.4(L)  Platelets 150 - 400 K/uL 308 181 286   ANC1.6k  . CMP Latest Ref Rng & Units 07/01/2020 06/17/2020 06/10/2020  Glucose 70 - 99 mg/dL 100(H) 102(H) 106(H)  BUN 6 - 20 mg/dL 14 13 16   Creatinine 0.44 - 1.00 mg/dL 0.74 0.72 0.69  Sodium 135 - 145 mmol/L 137 141 140  Potassium 3.5 - 5.1 mmol/L 3.9 3.5 3.7  Chloride 98 - 111 mmol/L 100 102 104  CO2 22 - 32 mmol/L 26 28 26   Calcium 8.9 - 10.3 mg/dL 9.8 9.1 9.1  Total Protein 6.5 - 8.1 g/dL 6.6 6.2(L) 6.0(L)  Total Bilirubin 0.3 - 1.2 mg/dL 0.4 0.3 0.3  Alkaline Phos 38 - 126 U/L 64 74 79  AST 15 - 41 U/L 11(L) 11(L) 7(L)  ALT 0 - 44 U/L 13 10 10      RADIOGRAPHIC STUDIES: I have personally reviewed the radiological images as listed and agreed with the findings in the report. No results found.  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 3) anemia due to multiple myeloma becoming more apparent as the patient's hemoconcentration due to dehydration has been corrected.   4) acute renal failure related to dehydration hypercalcemia and multiple myeloma.  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  -  Advised pt  that the emphasis is to work on mobility and performance status.  -Recommended pt use back brace to help increase mobility. -Discussed possibility of repeat lower back MRI to observe any changes or if anything can be done to help. -Will send referral to PT to help pt increase mobility. The pt will be seeing one in [place] pt her initial m-protein was 4.1 and she is already in [facility]. -Recommended pt wear compression socks (graded sports if medical grade are too bothersome) for swelling and keep legs elevated while sitting down and laying down.  -Recommended pt increase protein intake in diet. Recommended protein shake. -Continue Ergocalciferol twice weekly. -Increase Lasix to 40 mg BID. -Will increase Cytoxan dosage to 400 mg/m^2. Will monitor Hgb and Plt levels for potential transfusion needs.  -Continue Dexamethasone dosage at 20 mg from this cycle. -Recommended pt continue to try to reduce breakthrough pain medication needs. -Continue Neurontin at 300 mg in morning and 600 mg at night prior to bedtime. -Continue OTC Vitamin B-Complex to reduce chances of neuropathy due to vitamin deficiencies. -MRI Lower Back in 3-4 days. -Will see back in 1 week via phone.   FOLLOW UP: MRI L spine in 3-4 days Phone visit in 1 week Plz schedule C6 of CyBorD as ordered Plz schedule zometa q4weeks x 3 doses    All of the patients questions were answered with apparent satisfaction. The patient knows to call the clinic with any problems, questions or concerns.   The total time spent in the appointment was 30 minutes and more than 50% was on counseling and direct patient cares, ordering and mx of chemotherapy. Discussion with transplant team.    Sullivan Lone MD [place] AAHIVMS [facility] Hematology/Oncology Physician [facility]  (Office):       [phone] (Work cell):  [phone] (Fax):           [phone]  07/01/2020 9:55 AM  I, Reinaldo Raddle, am acting as scribe for Dr.  Sullivan Lone, MD.   .I have reviewed the above documentation for accuracy and completeness, and I agree with the above. Brunetta Genera MD

## 2020-07-01 ENCOUNTER — Inpatient Hospital Stay: Payer: BC Managed Care – PPO | Admitting: Hematology

## 2020-07-01 ENCOUNTER — Inpatient Hospital Stay: Payer: BC Managed Care – PPO

## 2020-07-01 ENCOUNTER — Ambulatory Visit
Admission: RE | Admit: 2020-07-01 | Discharge: 2020-07-01 | Disposition: A | Discharge status: Home / Self Care | Payer: BC Managed Care – PPO | Source: Ambulatory Visit | Attending: Radiation Oncology | Admitting: Radiation Oncology

## 2020-07-01 ENCOUNTER — Other Ambulatory Visit: Payer: Self-pay

## 2020-07-01 VITALS — BP 113/61 | HR 78 | Temp 98.6°F | Resp 18 | Wt 249.2 lb

## 2020-07-01 DIAGNOSIS — G893 Neoplasm related pain (acute) (chronic): Secondary | ICD-10-CM | POA: Diagnosis not present

## 2020-07-01 DIAGNOSIS — Z5111 Encounter for antineoplastic chemotherapy: Secondary | ICD-10-CM | POA: Diagnosis not present

## 2020-07-01 DIAGNOSIS — C7951 Secondary malignant neoplasm of bone: Secondary | ICD-10-CM

## 2020-07-01 DIAGNOSIS — C9000 Multiple myeloma not having achieved remission: Secondary | ICD-10-CM

## 2020-07-01 DIAGNOSIS — Z79899 Other long term (current) drug therapy: Secondary | ICD-10-CM | POA: Diagnosis not present

## 2020-07-01 DIAGNOSIS — Z7189 Other specified counseling: Secondary | ICD-10-CM

## 2020-07-01 DIAGNOSIS — Z5112 Encounter for antineoplastic immunotherapy: Secondary | ICD-10-CM | POA: Diagnosis not present

## 2020-07-01 LAB — CMP (CANCER CENTER ONLY)
ALT: 13 U/L (ref 0–44)
AST: 11 U/L — ABNORMAL LOW (ref 15–41)
Albumin: 3.7 g/dL (ref 3.5–5.0)
Alkaline Phosphatase: 64 U/L (ref 38–126)
Anion gap: 11 (ref 5–15)
BUN: 14 mg/dL (ref 6–20)
CO2: 26 mmol/L (ref 22–32)
Calcium: 9.8 mg/dL (ref 8.9–10.3)
Chloride: 100 mmol/L (ref 98–111)
Creatinine: 0.74 mg/dL (ref 0.44–1.00)
GFR, Estimated: >60 mL/min (ref >60)
Glucose, Bld: 100 mg/dL — ABNORMAL HIGH (ref 70–99)
Potassium: 3.9 mmol/L (ref 3.5–5.1)
Sodium: 137 mmol/L (ref 135–145)
Total Bilirubin: 0.4 mg/dL (ref 0.3–1.2)
Total Protein: 6.6 g/dL (ref 6.5–8.1)

## 2020-07-01 LAB — CBC WITH DIFFERENTIAL/PLATELET
Abs Immature Granulocytes: 0.03 10*3/uL (ref 0.00–0.07)
Basophils Absolute: 0 10*3/uL (ref 0.0–0.1)
Basophils Relative: 0 %
Eosinophils Absolute: 0.2 10*3/uL (ref 0.0–0.5)
Eosinophils Relative: 2 %
HCT: 33.8 % — ABNORMAL LOW (ref 36.0–46.0)
Hemoglobin: 10.9 g/dL — ABNORMAL LOW (ref 12.0–15.0)
Immature Granulocytes: 1 %
Lymphocytes Relative: 4 %
Lymphs Abs: 0.3 10*3/uL — ABNORMAL LOW (ref 0.7–4.0)
MCH: 31.4 pg (ref 26.0–34.0)
MCHC: 32.2 g/dL (ref 30.0–36.0)
MCV: 97.4 fL (ref 80.0–100.0)
Monocytes Absolute: 0.7 10*3/uL (ref 0.1–1.0)
Monocytes Relative: 11 %
Neutro Abs: 5.3 10*3/uL (ref 1.7–7.7)
Neutrophils Relative %: 82 %
Platelets: 308 10*3/uL (ref 150–400)
RBC: 3.47 MIL/uL — ABNORMAL LOW (ref 3.87–5.11)
RDW: 17.6 % — ABNORMAL HIGH (ref 11.5–15.5)
WBC: 6.4 10*3/uL (ref 4.0–10.5)
nRBC: 0 % (ref 0.0–0.2)

## 2020-07-01 MED ORDER — SODIUM CHLORIDE 0.9 % IV SOLN
400.0000 mg/m2 | Freq: Once | INTRAVENOUS | Status: AC
  Administered 2020-07-01: 880 mg via INTRAVENOUS
  Filled 2020-07-01: qty 44

## 2020-07-01 MED ORDER — SODIUM CHLORIDE 0.9 % IV SOLN
Freq: Once | INTRAVENOUS | Status: AC
Start: 2020-07-01 — End: 2020-07-01
  Filled 2020-07-01: qty 250

## 2020-07-01 MED ORDER — PALONOSETRON HCL INJECTION 0.25 MG/5ML
INTRAVENOUS | Status: AC
  Filled 2020-07-01: qty 5

## 2020-07-01 MED ORDER — SODIUM CHLORIDE 0.9 % IV SOLN
20.0000 mg | Freq: Once | INTRAVENOUS | Status: AC
  Administered 2020-07-01: 20 mg via INTRAVENOUS
  Filled 2020-07-01: qty 20

## 2020-07-01 MED ORDER — BORTEZOMIB CHEMO SQ INJECTION 3.5 MG (2.5MG/ML)
1.3000 mg/m2 | Freq: Once | INTRAMUSCULAR | Status: AC
Start: 2020-07-01 — End: 2020-07-01
  Administered 2020-07-01: 2.75 mg via SUBCUTANEOUS
  Filled 2020-07-01: qty 1.1

## 2020-07-01 MED ORDER — PALONOSETRON HCL INJECTION 0.25 MG/5ML
0.2500 mg | Freq: Once | INTRAVENOUS | Status: AC
  Administered 2020-07-01: 0.25 mg via INTRAVENOUS

## 2020-07-01 NOTE — Patient Instructions (Signed)
[facility] MEDICAL ONCOLOGY  Discharge Instructions: °Thank you for choosing [facility] to provide your oncology and hematology care.  ° °If you have a lab appointment with the Cancer Center, please go directly to the Cancer Center and check in at the registration area. °  °Wear comfortable clothing and clothing appropriate for easy access to any Portacath or PICC line.  ° °We strive to give you quality time with your provider. You may need to reschedule your appointment if you arrive late (15 or more minutes).  Arriving late affects you and other patients whose appointments are after yours.  Also, if you miss three or more appointments without notifying the office, you may be dismissed from the clinic at the provider’s discretion.    °  °For prescription refill requests, have your pharmacy contact our office and allow 72 hours for refills to be completed.   ° °Today you received the following chemotherapy and/or immunotherapy agents: Velcade and Cytoxan    °  °To help prevent nausea and vomiting after your treatment, we encourage you to take your nausea medication as directed. ° °BELOW ARE SYMPTOMS THAT SHOULD BE REPORTED IMMEDIATELY: °*FEVER GREATER THAN 100.4 F (38 °C) OR HIGHER °*CHILLS OR SWEATING °*NAUSEA AND VOMITING THAT IS NOT CONTROLLED WITH YOUR NAUSEA MEDICATION °*UNUSUAL SHORTNESS OF BREATH °*UNUSUAL BRUISING OR BLEEDING °*URINARY PROBLEMS (pain or burning when urinating, or frequent urination) °*BOWEL PROBLEMS (unusual diarrhea, constipation, pain near the anus) °TENDERNESS IN MOUTH AND THROAT WITH OR WITHOUT PRESENCE OF ULCERS (sore throat, sores in mouth, or a toothache) °UNUSUAL RASH, SWELLING OR PAIN  °UNUSUAL VAGINAL DISCHARGE OR ITCHING  ° °Items with * indicate a potential emergency and should be followed up as soon as possible or go to the Emergency Department if any problems should occur. ° °Please show the CHEMOTHERAPY ALERT CARD or IMMUNOTHERAPY ALERT CARD at  check-in to the Emergency Department and triage nurse. ° °Should you have questions after your visit or need to cancel or reschedule your appointment, please contact [facility] MEDICAL ONCOLOGY  Dept: [phone]  and follow the prompts.  Office hours are 8:00 a.m. to 4:30 p.m. Monday - Friday. Please note that voicemails left after 4:00 p.m. may not be returned until the following business day.  We are closed weekends and major holidays. You have access to a nurse at all times for urgent questions. Please call the main number to the clinic Dept: [phone] and follow the prompts. ° ° °For any non-urgent questions, you may also contact your provider using MyChart. We now offer e-Visits for anyone 18 and older to request care online for non-urgent symptoms. For details visit [email or web address]. °  °Also download the MyChart app! Go to the app store, search "MyChart", open the app, select [place], and log in with your MyChart username and password. ° °Due to Covid, a mask is required upon entering the hospital/clinic. If you do not have a mask, one will be given to you upon arrival. For doctor visits, patients may have 1 support person aged 18 or older with them. For treatment visits, patients cannot have anyone with them due to current Covid guidelines and our immunocompromised population.  ° °

## 2020-07-01 NOTE — Progress Notes (Signed)
  Radiation Oncology         [phone]) [phone] ________________________________  Name: Stacey Montoya MRN: [id number]  Date of Service: 07/01/2020  DOB: [birth year]  Post Treatment Telephone Note  Diagnosis:   Multiple myeloma with widespread bony disease and pain secondary to bony lesions.  Interval Since Last Radiation:  11 weeks   04/04/2020 through 04/17/2020 Site Technique Total Dose (Gy) Dose per Fx (Gy) Completed Fx Beam Energies  Femur Left: Ext_Lt 3D 25/25 2.5 10/10 15X  Femur Right: Ext_Rt 3D 25/25 2.5 10/10 15X  Lumbar Spine: Spine_T12-L5 3D 30/30 3 10/10 15X  Thoracic Spine: Spine_T1/Rib3 3D 25/25 2.5 10/10 10X     Narrative:  The patient was contacted today for routine follow-up. During treatment she did very well with radiotherapy and did not have significant desquamation.   Impression/Plan: 1. Multiple myeloma with widespread bony disease and pain secondary to bony lesions.I was unable to reach the patient or leave her a voicemail. We would be happy to continue to follow her as needed, but she will also continue to follow up with Dr. Irene Limbo in medical oncology.       Carola Rhine, PAC

## 2020-07-02 ENCOUNTER — Telehealth: Payer: Self-pay | Admitting: Hematology

## 2020-07-02 NOTE — Telephone Encounter (Signed)
Scheduled follow-up appointments per 5/16 los. Patient is aware. 

## 2020-07-03 ENCOUNTER — Other Ambulatory Visit: Payer: Self-pay | Admitting: Hematology

## 2020-07-03 ENCOUNTER — Telehealth: Payer: Self-pay | Admitting: *Deleted

## 2020-07-03 DIAGNOSIS — R609 Edema, unspecified: Secondary | ICD-10-CM

## 2020-07-03 DIAGNOSIS — R269 Unspecified abnormalities of gait and mobility: Secondary | ICD-10-CM | POA: Diagnosis not present

## 2020-07-03 DIAGNOSIS — R531 Weakness: Secondary | ICD-10-CM | POA: Diagnosis not present

## 2020-07-03 DIAGNOSIS — C9000 Multiple myeloma not having achieved remission: Secondary | ICD-10-CM

## 2020-07-03 DIAGNOSIS — C7951 Secondary malignant neoplasm of bone: Secondary | ICD-10-CM

## 2020-07-03 NOTE — Telephone Encounter (Signed)
Please review for refill.  

## 2020-07-03 NOTE — Telephone Encounter (Signed)
Contacted by Rhea Pink, Physical Therapist with [facility]. She requests information about patient goals r/t recent referral.  Per Dr. Irene Limbo, goals as follows: Ambulation with out assistance or assistance devices and increased strength in preparation for transplant.

## 2020-07-04 ENCOUNTER — Other Ambulatory Visit: Payer: Self-pay

## 2020-07-04 ENCOUNTER — Other Ambulatory Visit: Payer: Self-pay | Admitting: Hematology

## 2020-07-04 ENCOUNTER — Ambulatory Visit (HOSPITAL_COMMUNITY)
Admission: RE | Admit: 2020-07-04 | Discharge: 2020-07-04 | Disposition: A | Discharge status: Home / Self Care | Payer: BC Managed Care – PPO | Source: Ambulatory Visit | Attending: Hematology | Admitting: Hematology

## 2020-07-04 ENCOUNTER — Inpatient Hospital Stay: Payer: BC Managed Care – PPO

## 2020-07-04 VITALS — BP 111/70 | HR 79 | Temp 98.6°F | Resp 17

## 2020-07-04 DIAGNOSIS — Z5111 Encounter for antineoplastic chemotherapy: Secondary | ICD-10-CM | POA: Diagnosis not present

## 2020-07-04 DIAGNOSIS — C7951 Secondary malignant neoplasm of bone: Secondary | ICD-10-CM | POA: Insufficient documentation

## 2020-07-04 DIAGNOSIS — C9000 Multiple myeloma not having achieved remission: Secondary | ICD-10-CM | POA: Diagnosis not present

## 2020-07-04 DIAGNOSIS — Z5112 Encounter for antineoplastic immunotherapy: Secondary | ICD-10-CM | POA: Diagnosis not present

## 2020-07-04 DIAGNOSIS — Z79899 Other long term (current) drug therapy: Secondary | ICD-10-CM | POA: Diagnosis not present

## 2020-07-04 DIAGNOSIS — Z7189 Other specified counseling: Secondary | ICD-10-CM

## 2020-07-04 DIAGNOSIS — S32048G Other fracture of fourth lumbar vertebra, subsequent encounter for fracture with delayed healing: Secondary | ICD-10-CM

## 2020-07-04 DIAGNOSIS — M545 Low back pain, unspecified: Secondary | ICD-10-CM | POA: Diagnosis not present

## 2020-07-04 DIAGNOSIS — S22080S Wedge compression fracture of T11-T12 vertebra, sequela: Secondary | ICD-10-CM

## 2020-07-04 MED ORDER — PROCHLORPERAZINE MALEATE 10 MG PO TABS: 10.0000 mg | ORAL_TABLET | Freq: Once | ORAL | Status: DC

## 2020-07-04 MED ORDER — FENTANYL 25 MCG/HR TD PT72: 1.0000 | MEDICATED_PATCH | TRANSDERMAL | 0 refills | Status: DC

## 2020-07-04 MED ORDER — GADOBUTROL 1 MMOL/ML IV SOLN
10.0000 mL | Freq: Once | INTRAVENOUS | Status: AC | PRN
  Administered 2020-07-04: 10 mL via INTRAVENOUS

## 2020-07-04 MED ORDER — BORTEZOMIB CHEMO SQ INJECTION 3.5 MG (2.5MG/ML)
1.3000 mg/m2 | Freq: Once | INTRAMUSCULAR | Status: AC
  Administered 2020-07-04: 2.75 mg via SUBCUTANEOUS
  Filled 2020-07-04: qty 1.1

## 2020-07-05 ENCOUNTER — Telehealth: Payer: Self-pay | Admitting: Hematology

## 2020-07-05 NOTE — Telephone Encounter (Signed)
Urgent Referral sent per 5/19 sch msg. Sent via [facility].

## 2020-07-08 ENCOUNTER — Inpatient Hospital Stay: Payer: BC Managed Care – PPO

## 2020-07-08 ENCOUNTER — Other Ambulatory Visit: Payer: Self-pay

## 2020-07-08 ENCOUNTER — Other Ambulatory Visit: Payer: BC Managed Care – PPO

## 2020-07-08 ENCOUNTER — Telehealth: Payer: Self-pay | Admitting: Hematology

## 2020-07-08 VITALS — BP 127/71 | HR 81 | Temp 98.3°F | Resp 16 | Wt 249.5 lb

## 2020-07-08 DIAGNOSIS — Z79899 Other long term (current) drug therapy: Secondary | ICD-10-CM | POA: Diagnosis not present

## 2020-07-08 DIAGNOSIS — Z7189 Other specified counseling: Secondary | ICD-10-CM

## 2020-07-08 DIAGNOSIS — Z5112 Encounter for antineoplastic immunotherapy: Secondary | ICD-10-CM | POA: Diagnosis not present

## 2020-07-08 DIAGNOSIS — C9000 Multiple myeloma not having achieved remission: Secondary | ICD-10-CM | POA: Diagnosis not present

## 2020-07-08 DIAGNOSIS — Z5111 Encounter for antineoplastic chemotherapy: Secondary | ICD-10-CM

## 2020-07-08 LAB — CBC WITH DIFFERENTIAL/PLATELET
Abs Immature Granulocytes: 0.03 10*3/uL (ref 0.00–0.07)
Basophils Absolute: 0 10*3/uL (ref 0.0–0.1)
Basophils Relative: 0 %
Eosinophils Absolute: 0.1 10*3/uL (ref 0.0–0.5)
Eosinophils Relative: 2 %
HCT: 34.9 % — ABNORMAL LOW (ref 36.0–46.0)
Hemoglobin: 11.2 g/dL — ABNORMAL LOW (ref 12.0–15.0)
Immature Granulocytes: 1 %
Lymphocytes Relative: 6 %
Lymphs Abs: 0.3 10*3/uL — ABNORMAL LOW (ref 0.7–4.0)
MCH: 31.3 pg (ref 26.0–34.0)
MCHC: 32.1 g/dL (ref 30.0–36.0)
MCV: 97.5 fL (ref 80.0–100.0)
Monocytes Absolute: 0.5 10*3/uL (ref 0.1–1.0)
Monocytes Relative: 10 %
Neutro Abs: 4.2 10*3/uL (ref 1.7–7.7)
Neutrophils Relative %: 81 %
Platelets: 200 10*3/uL (ref 150–400)
RBC: 3.58 MIL/uL — ABNORMAL LOW (ref 3.87–5.11)
RDW: 17.3 % — ABNORMAL HIGH (ref 11.5–15.5)
WBC: 5.1 10*3/uL (ref 4.0–10.5)
nRBC: 0 % (ref 0.0–0.2)

## 2020-07-08 LAB — CMP (CANCER CENTER ONLY)
ALT: 14 U/L (ref 0–44)
AST: 12 U/L — ABNORMAL LOW (ref 15–41)
Albumin: 3.6 g/dL (ref 3.5–5.0)
Alkaline Phosphatase: 58 U/L (ref 38–126)
Anion gap: 12 (ref 5–15)
BUN: 13 mg/dL (ref 6–20)
CO2: 29 mmol/L (ref 22–32)
Calcium: 9.6 mg/dL (ref 8.9–10.3)
Chloride: 99 mmol/L (ref 98–111)
Creatinine: 0.77 mg/dL (ref 0.44–1.00)
GFR, Estimated: >60 mL/min (ref >60)
Glucose, Bld: 102 mg/dL — ABNORMAL HIGH (ref 70–99)
Potassium: 3.6 mmol/L (ref 3.5–5.1)
Sodium: 140 mmol/L (ref 135–145)
Total Bilirubin: 0.4 mg/dL (ref 0.3–1.2)
Total Protein: 6.5 g/dL (ref 6.5–8.1)

## 2020-07-08 MED ORDER — SODIUM CHLORIDE 0.9 % IV SOLN
20.0000 mg | Freq: Once | INTRAVENOUS | Status: AC
  Administered 2020-07-08: 20 mg via INTRAVENOUS
  Filled 2020-07-08: qty 20

## 2020-07-08 MED ORDER — PALONOSETRON HCL INJECTION 0.25 MG/5ML
0.2500 mg | Freq: Once | INTRAVENOUS | Status: AC
  Administered 2020-07-08: 0.25 mg via INTRAVENOUS

## 2020-07-08 MED ORDER — PALONOSETRON HCL INJECTION 0.25 MG/5ML
INTRAVENOUS | Status: AC
  Filled 2020-07-08: qty 5

## 2020-07-08 MED ORDER — SODIUM CHLORIDE 0.9 % IV SOLN
400.0000 mg/m2 | Freq: Once | INTRAVENOUS | Status: AC
  Administered 2020-07-08: 880 mg via INTRAVENOUS
  Filled 2020-07-08: qty 44

## 2020-07-08 MED ORDER — BORTEZOMIB CHEMO SQ INJECTION 3.5 MG (2.5MG/ML)
1.3000 mg/m2 | Freq: Once | INTRAMUSCULAR | Status: AC
  Administered 2020-07-08: 2.75 mg via SUBCUTANEOUS
  Filled 2020-07-08: qty 1.1

## 2020-07-08 MED ORDER — SODIUM CHLORIDE 0.9 % IV SOLN
Freq: Once | INTRAVENOUS | Status: AC
Start: 2020-07-08 — End: 2020-07-08
  Filled 2020-07-08: qty 250

## 2020-07-08 NOTE — Patient Instructions (Signed)
[facility]ICAL [facility]COLOG[facility]   Discharge Instructions: Thank you for choosing [facility] to provide your oncology and hematology care.   If you have a lab appointment with the Cancer Center, please go directly to the Cancer Center and check in at the registration area.   Wear comfortable clothing and clothing appropriate for easy access to any Portacath or PICC line.   We strive to give you quality time with your provider. You may need to reschedule your appointment if you arrive late (15 or more minutes).  Arriving late affects you and other patients whose appointments are after yours.  Also, if you miss three or more appointments without notifying the office, you may be dismissed from the clinic at the provider's discretion.      For prescription refill requests, have your pharmacy contact our office and allow 72 hours for refills to be completed.    Today you received the following chemotherapy and/or immunotherapy agents: bortezomib and cyclophosphamide.      To help prevent nausea and vomiting after your treatment, we encourage you to take your nausea medication as directed.  BELOW ARE SYMPTOMS THAT SHOULD BE REPORTED IMMEDIATELY: *FEVER GREATER THAN 100.4 F (38 C) OR HIGHER *CHILLS OR SWEATING *NAUSEA AND VOMITING THAT IS NOT CONTROLLED WITH YOUR NAUSEA MEDICATION *UNUSUAL SHORTNESS OF BREATH *UNUSUAL BRUISING OR BLEEDING *URINARY PROBLEMS (pain or burning when urinating, or frequent urination) *BOWEL PROBLEMS (unusual diarrhea, constipation, pain near the anus) TENDERNESS IN MOUTH AND THROAT WITH OR WITHOUT PRESENCE OF ULCERS (sore throat, sores in mouth, or a toothache) UNUSUAL RASH, SWELLING OR PAIN  UNUSUAL VAGINAL DISCHARGE OR ITCHING   Items with * indicate a potential emergency and should be followed up as soon as possible or go to the Emergency Department if any problems should occur.  Please show the CHEMOTHERAPY ALERT CARD or IMMUNOTHERAPY  ALERT CARD at check-in to the Emergency Department and triage nurse.  Should you have questions after your visit or need to cancel or reschedule your appointment, please contact [facility]  Dept: [phone]  and follow the prompts.  Office hours are 8:00 a.m. to 4:30 p.m. Monday - Friday. Please note that voicemails left after 4:00 p.m. may not be returned until the following business day.  We are closed weekends and major holidays. You have access to a nurse at all times for urgent questions. Please call the main number to the clinic Dept: [phone] and follow the prompts.   For any non-urgent questions, you may also contact your provider using MyChart. We now offer e-Visits for anyone 18 and older to request care online for non-urgent symptoms. For details visit [email or web address].   Also download the MyChart app! Go to the app store, search "MyChart", open the app, select [place], and log in with your MyChart username and password.  Due to Covid, a mask is required upon entering the hospital/clinic. If you do not have a mask, one will be given to you upon arrival. For doctor visits, patients may have 1 support person aged 18 or older with them. For treatment visits, patients cannot have anyone with them due to current Covid guidelines and our immunocompromised population.   

## 2020-07-08 NOTE — Telephone Encounter (Signed)
Scheduled appts per 5/23 sch msg. Updated calendar will be printed for pt.

## 2020-07-09 DIAGNOSIS — R531 Weakness: Secondary | ICD-10-CM | POA: Diagnosis not present

## 2020-07-09 DIAGNOSIS — C9000 Multiple myeloma not having achieved remission: Secondary | ICD-10-CM | POA: Diagnosis not present

## 2020-07-09 DIAGNOSIS — R269 Unspecified abnormalities of gait and mobility: Secondary | ICD-10-CM | POA: Diagnosis not present

## 2020-07-09 NOTE — Progress Notes (Incomplete)
HEMATOLOGY/ONCOLOGY CLINIC NOTE  Date of Service: 07/01/2020  Patient Care Team: Celene Squibb, MD as PCP - General (Internal Medicine) Corrie Mckusick, DO as Consulting Physician (Interventional Radiology)  CHIEF COMPLAINTS/PURPOSE OF CONSULTATION:  Multiple Myeloma- continued mx  HISTORY OF PRESENTING ILLNESS:  I connected with Stacey Montoya on 07/10/2020 by telephone and verified that I am speaking with the correct person using two identifiers.   I discussed the limitations of evaluation and management by telemedicine. The patient expressed understanding and agreed to proceed.   Other persons participating in the visit and their role in the encounter:                                                         - Reinaldo Raddle, Medical Scribe     Patient's location: Home Provider's location: [place] [facility] [place] is a wonderful 45 y.o. female who is here today for evaluation and management of  Myeloma. The patient's last visit with [place] was inpatient on 07/01/2020. The pt reports that she is doing well overall. We are joined today by her husband.  The pt reports ***  Of note since the patient's last visit, pt has had MR Lumbar Spine ([id number]) on 07/04/2020, which revealed "1. Diffuse enhancing marrow lesions throughout the visible spine and pelvis compatible with severe multiple myeloma. Another widely metastatic malignancy could also have this appearance. Comparing across different modalities for individual lesion restaging is difficult, but the disease burden appears grossly stable since February. 2. Pathologic compression fracture of T12 has progressed since February, but with no retropulsion or stenosis. 3. Severe pathologic fracture of L4 with bulky retropulsion and associated spinal stenosis appears stable since February"  Lab results today *** of CBC w/diff and CMP is as follows: all values are WNL except for ***  On review of systems, pt reports ***  and denies *** and any other symptoms.  MEDICAL HISTORY:  Past Medical History:  Diagnosis Date  . Abnormal Pap smear of cervix   . Back pain   . Hyperlipidemia     SURGICAL HISTORY: Past Surgical History:  Procedure Laterality Date  . CERVICAL CONIZATION W/BX  12/02/2010   Procedure: CONIZATION CERVIX WITH BIOPSY;  Surgeon: Arloa Koh;  Location: [facility];  Service: Gynecology;  Laterality: N/A;  COLD KNIFE  . CESAREAN SECTION    . DILATION AND CURETTAGE OF UTERUS  12/02/2010   Procedure: DILATATION AND CURETTAGE (D&C);  Surgeon: Arloa Koh;  Location: [facility];  Service: Gynecology;  Laterality: N/A;  . IR RADIOLOGIST EVAL & MGMT  04/16/2020  . left hand     drain - infection  . WISDOM TOOTH EXTRACTION      SOCIAL HISTORY: Social History   Socioeconomic History  . Marital status: Married    Spouse name: Not on file  . Number of children: Not on file  . Years of education: Not on file  . Highest education level: Not on file  Occupational History  . Not on file  Tobacco Use  . Smoking status: Current Every Day Smoker    Packs/day: 0.50    Years: 20.00    Pack years: 10.00    Types: Cigarettes  . Smokeless tobacco: Never Used  Vaping Use  . Vaping Use: Every  day  Substance and Sexual Activity  . Alcohol use: Not Currently    Comment: socially - 6 beers per month  . Drug use: Not Currently  . Sexual activity: Yes    Partners: Male    Birth control/protection: Condom  Other Topics Concern  . Not on file  Social History Narrative   ** Merged History Encounter **       Social Determinants of Health   Financial Resource Strain: Not on file  Food Insecurity: Not on file  Transportation Needs: Not on file  Physical Activity: Not on file  Stress: Not on file  Social Connections: Not on file  Intimate Partner Violence: Not on file    FAMILY HISTORY: Family History  Problem Relation Age of Onset  . Lung cancer Maternal Grandfather   . Pancreatic cancer  Paternal Grandfather     ALLERGIES:  is allergic to zithromax [azithromycin].  MEDICATIONS:  Current Outpatient Medications  Medication Sig Dispense Refill  . acyclovir (ZOVIRAX) 400 MG tablet Take 1 tablet (400 mg total) by mouth 2 (two) times daily. 60 tablet 3  . albuterol (VENTOLIN HFA) 108 (90 Base) MCG/ACT inhaler Inhale 1-2 puffs into the lungs as needed for wheezing.    Marland Kitchen amLODipine (NORVASC) 5 MG tablet Take 1 tablet (5 mg total) by mouth daily. 90 tablet 0  . apixaban (ELIQUIS) 5 MG TABS tablet Take 1 tablet (5 mg total) by mouth 2 (two) times daily. 60 tablet 2  . b complex vitamins capsule Take 1 capsule by mouth daily. 30 capsule   . cholecalciferol (VITAMIN D) 25 MCG tablet Take 2 tablets (2,000 Units total) by mouth daily. 180 tablet 0  . dexamethasone (DECADRON) 4 MG tablet TAKE 10 TABLETS ON DAY 15 OF EACH CYCLE. REPEAT EVERY 21 DAYS. TAKE WITH BREAKFAST. 10 tablet 0  . ergocalciferol (VITAMIN D2) 1.25 MG (50000 UT) capsule Take 1 capsule (50,000 Units total) by mouth 2 (two) times a week. 12 capsule 2  . fentaNYL (DURAGESIC) 25 MCG/HR Place 1 patch onto the skin every 3 (three) days. 10 patch 0  . furosemide (LASIX) 20 MG tablet TAKE (1) TABLET BY MOUTH TWICE DAILY. 60 tablet 0  . gabapentin (NEURONTIN) 300 MG capsule Plz take 390m (1 cap)PO in the morning and 6053m(2 caps) po at bedtime 180 capsule 1  . LORazepam (ATIVAN) 0.5 MG tablet TAKE (1) TABLET BY MOUTH EVERY SIX HOURS AS NEEDED. 30 tablet 0  . magic mouthwash w/lidocaine SOLN Dispense 40041mf magic mouthwash compounded preparation. Patient to use 5ml74mtimes daily as needed for throat discomfort. 80 ml viscous lidocaine 2% 80 ml Mylanta 80 ml diphenhydramine at 12.5 mg per 5 ml elixir 80 ml nystatin at 100,000U per 5 mL suspension 80 ml distilled water 400 mL 1  . medroxyPROGESTERone (PROVERA) 10 MG tablet daily.    . metoprolol tartrate (LOPRESSOR) 50 MG tablet Take 1 tablet (50 mg total) by mouth 2 (two)  times daily. 180 tablet 0  . nicotine (NICODERM CQ - DOSED IN MG/24 HOURS) 21 mg/24hr patch Place 1 patch (21 mg total) onto the skin daily. 28 patch 0  . norethindrone (MICRONOR) 0.35 MG tablet Take 1 tablet by mouth daily.    . norethindrone (MICRONOR,CAMILA,ERRIN) 0.35 MG tablet Take 1 tablet by mouth daily.    . ondansetron (ZOFRAN) 4 MG tablet Take 4 mg by mouth every 8 (eight) hours.    . ondansetron (ZOFRAN) 8 MG tablet Take 1 tablet (8 mg  total) by mouth 2 (two) times daily as needed for refractory nausea / vomiting. Start on day 3 after Cytoxan. 30 tablet 1  . oxyCODONE (OXY IR/ROXICODONE) 5 MG immediate release tablet TAKE 1 OR 2 TABLETS BY MOUTH EVERY 6 HOURS AS NEEDED 60 tablet 0  . polyethylene glycol (MIRALAX / GLYCOLAX) 17 g packet Take 17 g by mouth daily. 14 each 0  . potassium chloride SA (KLOR-CON) 20 MEQ tablet Take 1 tablet (20 mEq total) by mouth 2 (two) times daily. 60 tablet 1  . prochlorperazine (COMPAZINE) 10 MG tablet Take 1 tablet (10 mg total) by mouth every 6 (six) hours as needed (Nausea or vomiting). 30 tablet 1   No current facility-administered medications for this visit.    REVIEW OF SYSTEMS:   10 Point review of Systems was done is negative except as noted above.  PHYSICAL EXAMINATION: ECOG PERFORMANCE STATUS: 2 - Symptomatic, <50% confined to bed VS stable  *** GENERAL:alert, in no acute distress and comfortable SKIN: no acute rashes, no significant lesions EYES: conjunctiva are pink and non-injected, sclera anicteric OROPHARYNX: MMM, no exudates, no oropharyngeal erythema or ulceration NECK: supple, no JVD LYMPH:  no palpable lymphadenopathy in the cervical, axillary or inguinal regions LUNGS: clear to auscultation b/l with normal respiratory effort HEART: regular rate & rhythm ABDOMEN:  normoactive bowel sounds , non tender, not distended. Extremity: no pedal edema PSYCH: alert & oriented x 3 with fluent speech NEURO: no focal motor/sensory  deficits   LABORATORY DATA:  I have reviewed the data as listed  . CBC Latest Ref Rng & Units 07/08/2020 07/01/2020 06/17/2020  WBC 4.0 - 10.5 K/uL 5.1 6.4 4.9  Hemoglobin 12.0 - 15.0 g/dL 11.2(L) 10.9(L) 10.0(L)  Hematocrit 36.0 - 46.0 % 34.9(L) 33.8(L) 32.1(L)  Platelets 150 - 400 K/uL 200 308 181   ANC1.6k  . CMP Latest Ref Rng & Units 07/08/2020 07/01/2020 06/17/2020  Glucose 70 - 99 mg/dL 102(H) 100(H) 102(H)  BUN 6 - 20 mg/dL _0 Creatinine 0.44 - 1.00 mg/dL 0.77 0.74 0.72  Sodium 135 - 145 mmol/L 140 137 141  Potassium 3.5 - 5.1 mmol/L 3.6 3.9 3.5  Chloride 98 - 111 mmol/L 99 100 102  CO2 22 - 32 mmol/L _1 Calcium 8.9 - 10.3 mg/dL 9.6 9.8 9.1  Total Protein 6.5 - 8.1 g/dL 6.5 6.6 6.2(L)  Total Bilirubin 0.3 - 1.2 mg/dL 0.4 0.4 0.3  Alkaline Phos 38 - 126 U/L 58 64 74  AST 15 - 41 U/L 12(L) 11(L) 11(L)  ALT 0 - 44 U/L _2 RADIOGRAPHIC STUDIES: I have personally reviewed the radiological images as listed and agreed with the findings in the report. MR Lumbar Spine W Wo Contrast  Result Date: 07/04/2020 CLINICAL DATA:  45 year old female with history of multiple myeloma. Persistent severe back pain despite good treatment response. Restaging. EXAM: MRI LUMBAR SPINE WITHOUT AND WITH CONTRAST TECHNIQUE: Multiplanar and multiecho pulse sequences of the lumbar spine were obtained without and with intravenous contrast. CONTRAST:  75m GADAVIST GADOBUTROL 1 MMOL/ML IV SOLN COMPARISON:  PET-CT 04/24/2020. Lumbar spine CT 04/02/2020. No prior MRI. FINDINGS: Segmentation:  Normal as seen on the CT. Alignment:  Stable straightening of cervical lordosis. Vertebrae: Diffuse dark T1, T2/STIR hyperintense and enhancing marrow lesions throughout the visible spine and pelvis in keeping with advanced multiple myeloma. Pathologic compression fracture of T12 was present in February but demonstrates severe central loss of height since  that time, approximately 70%. No retropulsion  there. Severe L4 compression fracture with retropulsion not significantly changed since February. Retropulsion results in moderate to severe spinal stenosis when combined with epidural lipomatosis as seen on series 9, image 8. Other visible spinal levels appear intact with extensive vertebral body and posterior element enhancing lesions, including the visible central sacrum beginning at S2. Conus medullaris and cauda equina: Conus extends to the T12-L1 level. No lower spinal cord or conus signal abnormality. No abnormal intradural enhancement. No dural thickening. Paraspinal and other soft tissues: Negative. Disc levels: Outside of the L4 level there is no significant spinal stenosis despite epidural lipomatosis effacing some CSF from the thecal sac at L3 and L5. IMPRESSION: 1. Diffuse enhancing marrow lesions throughout the visible spine and pelvis compatible with severe multiple myeloma. Another widely metastatic malignancy could also have this appearance. Comparing across different modalities for individual lesion restaging is difficult, but the disease burden appears grossly stable since February. 2. Pathologic compression fracture of T12 has progressed since February, but with no retropulsion or stenosis. 3. Severe pathologic fracture of L4 with bulky retropulsion and associated spinal stenosis appears stable since February. Electronically Signed   By: Genevie Ann M.D.   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 3) anemia due to multiple myeloma becoming more apparent as the patient's hemoconcentration due to dehydration has been corrected.   4) acute renal failure  related to dehydration hypercalcemia and multiple myeloma.  Renal function is improving with IV fluids and improving calcium levels 5) multilevel pathologic fractures in the spine most symptomatic at L4-5 with some epidural tumor and left lower extremity radicular pain 6) severe constipation related to chronic constipation plus hypercalcemia plus   PLAN: -Discussed pt ***   -Recommended pt increase protein intake in diet. Recommended protein shake. -Continue Ergocalciferol twice weekly. -Increase Lasix to 40 mg BID. -Will increase Cytoxan dosage to 400 mg/m^2. Will monitor Hgb and Plt levels for potential transfusion needs.  -Continue Dexamethasone dosage at 20 mg from this cycle. -Recommended pt continue to try to reduce breakthrough pain medication needs. -Continue Neurontin at 300 mg in morning and 600 mg at night prior to bedtime. -Continue OTC Vitamin B-Complex to reduce chances of neuropathy due to vitamin deficiencies. -Will see back in ***   FOLLOW UP: ***   All of the patients questions were answered with apparent satisfaction. The patient knows to call the clinic with any problems, questions or concerns.   The total time spent in the appointment was *** minutes and more than 50% was on counseling and direct patient cares.     Sullivan Lone MD [place] AAHIVMS [facility] Hematology/Oncology Physician [facility]  (Office):       [phone] (Work cell):  [phone] (Fax):           [phone]  07/09/2020 12:41 PM  I, Reinaldo Raddle, am acting as scribe for Dr. Sullivan Lone, MD.

## 2020-07-10 ENCOUNTER — Inpatient Hospital Stay (HOSPITAL_BASED_OUTPATIENT_CLINIC_OR_DEPARTMENT_OTHER): Payer: BC Managed Care – PPO | Admitting: Hematology

## 2020-07-10 DIAGNOSIS — C9000 Multiple myeloma not having achieved remission: Secondary | ICD-10-CM

## 2020-07-10 MED ORDER — METOPROLOL TARTRATE 50 MG PO TABS: 50.0000 mg | ORAL_TABLET | Freq: Two times a day (BID) | ORAL | 0 refills | Status: DC

## 2020-07-10 MED ORDER — AMLODIPINE BESYLATE 5 MG PO TABS: 5.0000 mg | ORAL_TABLET | Freq: Every day | ORAL | 0 refills | Status: DC

## 2020-07-11 ENCOUNTER — Other Ambulatory Visit: Payer: BC Managed Care – PPO

## 2020-07-11 ENCOUNTER — Other Ambulatory Visit: Payer: Self-pay | Admitting: Hematology

## 2020-07-11 ENCOUNTER — Other Ambulatory Visit: Payer: Self-pay

## 2020-07-11 ENCOUNTER — Inpatient Hospital Stay: Payer: BC Managed Care – PPO

## 2020-07-11 VITALS — BP 121/72 | HR 78 | Temp 98.2°F | Resp 18

## 2020-07-11 DIAGNOSIS — Z5112 Encounter for antineoplastic immunotherapy: Secondary | ICD-10-CM | POA: Diagnosis not present

## 2020-07-11 DIAGNOSIS — Z79899 Other long term (current) drug therapy: Secondary | ICD-10-CM | POA: Diagnosis not present

## 2020-07-11 DIAGNOSIS — C9000 Multiple myeloma not having achieved remission: Secondary | ICD-10-CM

## 2020-07-11 DIAGNOSIS — C7951 Secondary malignant neoplasm of bone: Secondary | ICD-10-CM

## 2020-07-11 DIAGNOSIS — Z7189 Other specified counseling: Secondary | ICD-10-CM

## 2020-07-11 DIAGNOSIS — R609 Edema, unspecified: Secondary | ICD-10-CM

## 2020-07-11 DIAGNOSIS — Z5111 Encounter for antineoplastic chemotherapy: Secondary | ICD-10-CM | POA: Diagnosis not present

## 2020-07-11 MED ORDER — BORTEZOMIB CHEMO SQ INJECTION 3.5 MG (2.5MG/ML)
1.3000 mg/m2 | Freq: Once | INTRAMUSCULAR | Status: AC
  Administered 2020-07-11: 2.75 mg via SUBCUTANEOUS
  Filled 2020-07-11: qty 1.1

## 2020-07-11 MED ORDER — PROCHLORPERAZINE MALEATE 10 MG PO TABS: 10.0000 mg | ORAL_TABLET | Freq: Once | ORAL | Status: DC

## 2020-07-11 NOTE — Patient Instructions (Signed)
[facility] MEDICAL ONCOLOGY   ?Discharge Instructions: ?Thank you for choosing [facility] to provide your oncology and hematology care.  ? ?If you have a lab appointment with the Cancer Center, please go directly to the Cancer Center and check in at the registration area. ?  ?Wear comfortable clothing and clothing appropriate for easy access to any Portacath or PICC line.  ? ?We strive to give you quality time with your provider. You may need to reschedule your appointment if you arrive late (15 or more minutes).  Arriving late affects you and other patients whose appointments are after yours.  Also, if you miss three or more appointments without notifying the office, you may be dismissed from the clinic at the provider?s discretion.    ?  ?For prescription refill requests, have your pharmacy contact our office and allow 72 hours for refills to be completed.   ? ?Today you received the following chemotherapy and/or immunotherapy agents: bortezomib    ?  ?To help prevent nausea and vomiting after your treatment, we encourage you to take your nausea medication as directed. ? ?BELOW ARE SYMPTOMS THAT SHOULD BE REPORTED IMMEDIATELY: ?*FEVER GREATER THAN 100.4 F (38 ?C) OR HIGHER ?*CHILLS OR SWEATING ?*NAUSEA AND VOMITING THAT IS NOT CONTROLLED WITH YOUR NAUSEA MEDICATION ?*UNUSUAL SHORTNESS OF BREATH ?*UNUSUAL BRUISING OR BLEEDING ?*URINARY PROBLEMS (pain or burning when urinating, or frequent urination) ?*BOWEL PROBLEMS (unusual diarrhea, constipation, pain near the anus) ?TENDERNESS IN MOUTH AND THROAT WITH OR WITHOUT PRESENCE OF ULCERS (sore throat, sores in mouth, or a toothache) ?UNUSUAL RASH, SWELLING OR PAIN  ?UNUSUAL VAGINAL DISCHARGE OR ITCHING  ? ?Items with * indicate a potential emergency and should be followed up as soon as possible or go to the Emergency Department if any problems should occur. ? ?Please show the CHEMOTHERAPY ALERT CARD or IMMUNOTHERAPY ALERT CARD at check-in  to the Emergency Department and triage nurse. ? ?Should you have questions after your visit or need to cancel or reschedule your appointment, please contact [facility] MEDICAL ONCOLOGY  Dept: [phone]  and follow the prompts.  Office hours are 8:00 a.m. to 4:30 p.m. Monday - Friday. Please note that voicemails left after 4:00 p.m. may not be returned until the following business day.  We are closed weekends and major holidays. You have access to a nurse at all times for urgent questions. Please call the main number to the clinic Dept: [phone] and follow the prompts. ? ? ?For any non-urgent questions, you may also contact your provider using MyChart. We now offer e-Visits for anyone 18 and older to request care online for non-urgent symptoms. For details visit [email or web address]. ?  ?Also download the MyChart app! Go to the app store, search "MyChart", open the app, select [facility], and log in with your MyChart username and password. ? ?Due to Covid, a mask is required upon entering the hospital/clinic. If you do not have a mask, one will be given to you upon arrival. For doctor visits, patients may have 1 support person aged 18 or older with them. For treatment visits, patients cannot have anyone with them due to current Covid guidelines and our immunocompromised population.  ? ?

## 2020-07-12 ENCOUNTER — Other Ambulatory Visit: Payer: Self-pay | Admitting: Hematology

## 2020-07-12 DIAGNOSIS — R609 Edema, unspecified: Secondary | ICD-10-CM

## 2020-07-12 DIAGNOSIS — C7951 Secondary malignant neoplasm of bone: Secondary | ICD-10-CM

## 2020-07-12 DIAGNOSIS — C9000 Multiple myeloma not having achieved remission: Secondary | ICD-10-CM

## 2020-07-12 DIAGNOSIS — R269 Unspecified abnormalities of gait and mobility: Secondary | ICD-10-CM | POA: Diagnosis not present

## 2020-07-12 DIAGNOSIS — R531 Weakness: Secondary | ICD-10-CM | POA: Diagnosis not present

## 2020-07-16 ENCOUNTER — Encounter: Payer: Self-pay | Admitting: Hematology

## 2020-07-16 DIAGNOSIS — R269 Unspecified abnormalities of gait and mobility: Secondary | ICD-10-CM | POA: Diagnosis not present

## 2020-07-16 DIAGNOSIS — R531 Weakness: Secondary | ICD-10-CM | POA: Diagnosis not present

## 2020-07-16 DIAGNOSIS — C9000 Multiple myeloma not having achieved remission: Secondary | ICD-10-CM | POA: Diagnosis not present

## 2020-07-17 DIAGNOSIS — M4850XA Collapsed vertebra, not elsewhere classified, site unspecified, initial encounter for fracture: Secondary | ICD-10-CM | POA: Insufficient documentation

## 2020-07-17 DIAGNOSIS — Z720 Tobacco use: Secondary | ICD-10-CM | POA: Insufficient documentation

## 2020-07-17 DIAGNOSIS — N879 Dysplasia of cervix uteri, unspecified: Secondary | ICD-10-CM | POA: Insufficient documentation

## 2020-07-17 DIAGNOSIS — I10 Essential (primary) hypertension: Secondary | ICD-10-CM | POA: Insufficient documentation

## 2020-07-17 DIAGNOSIS — E785 Hyperlipidemia, unspecified: Secondary | ICD-10-CM | POA: Insufficient documentation

## 2020-07-17 DIAGNOSIS — G47419 Narcolepsy without cataplexy: Secondary | ICD-10-CM | POA: Insufficient documentation

## 2020-07-19 DIAGNOSIS — R531 Weakness: Secondary | ICD-10-CM | POA: Diagnosis not present

## 2020-07-19 DIAGNOSIS — R269 Unspecified abnormalities of gait and mobility: Secondary | ICD-10-CM | POA: Diagnosis not present

## 2020-07-19 DIAGNOSIS — C9000 Multiple myeloma not having achieved remission: Secondary | ICD-10-CM | POA: Diagnosis not present

## 2020-07-22 ENCOUNTER — Other Ambulatory Visit: Payer: Self-pay | Admitting: Hematology and Oncology

## 2020-07-22 ENCOUNTER — Inpatient Hospital Stay: Payer: BC Managed Care – PPO | Attending: Hematology

## 2020-07-22 ENCOUNTER — Other Ambulatory Visit: Payer: Self-pay

## 2020-07-22 ENCOUNTER — Inpatient Hospital Stay: Payer: BC Managed Care – PPO

## 2020-07-22 VITALS — BP 121/70 | HR 79 | Temp 98.2°F | Resp 18 | Wt 248.6 lb

## 2020-07-22 DIAGNOSIS — C9000 Multiple myeloma not having achieved remission: Secondary | ICD-10-CM | POA: Insufficient documentation

## 2020-07-22 DIAGNOSIS — C7951 Secondary malignant neoplasm of bone: Secondary | ICD-10-CM

## 2020-07-22 DIAGNOSIS — Z5112 Encounter for antineoplastic immunotherapy: Secondary | ICD-10-CM | POA: Diagnosis not present

## 2020-07-22 DIAGNOSIS — Z7189 Other specified counseling: Secondary | ICD-10-CM

## 2020-07-22 DIAGNOSIS — Z5111 Encounter for antineoplastic chemotherapy: Secondary | ICD-10-CM

## 2020-07-22 DIAGNOSIS — Z79899 Other long term (current) drug therapy: Secondary | ICD-10-CM | POA: Diagnosis not present

## 2020-07-22 LAB — CMP (CANCER CENTER ONLY)
ALT: 17 U/L (ref 0–44)
AST: 11 U/L — ABNORMAL LOW (ref 15–41)
Albumin: 3.7 g/dL (ref 3.5–5.0)
Alkaline Phosphatase: 54 U/L (ref 38–126)
Anion gap: 14 (ref 5–15)
BUN: 14 mg/dL (ref 6–20)
CO2: 25 mmol/L (ref 22–32)
Calcium: 9.8 mg/dL (ref 8.9–10.3)
Chloride: 102 mmol/L (ref 98–111)
Creatinine: 0.77 mg/dL (ref 0.44–1.00)
GFR, Estimated: >60 mL/min (ref >60)
Glucose, Bld: 141 mg/dL — ABNORMAL HIGH (ref 70–99)
Potassium: 3.5 mmol/L (ref 3.5–5.1)
Sodium: 141 mmol/L (ref 135–145)
Total Bilirubin: 0.4 mg/dL (ref 0.3–1.2)
Total Protein: 6.6 g/dL (ref 6.5–8.1)

## 2020-07-22 LAB — CBC WITH DIFFERENTIAL/PLATELET
Abs Immature Granulocytes: 0.01 10*3/uL (ref 0.00–0.07)
Basophils Absolute: 0 10*3/uL (ref 0.0–0.1)
Basophils Relative: 0 %
Eosinophils Absolute: 0.1 10*3/uL (ref 0.0–0.5)
Eosinophils Relative: 3 %
HCT: 36.5 % (ref 36.0–46.0)
Hemoglobin: 11.5 g/dL — ABNORMAL LOW (ref 12.0–15.0)
Immature Granulocytes: 0 %
Lymphocytes Relative: 6 %
Lymphs Abs: 0.3 10*3/uL — ABNORMAL LOW (ref 0.7–4.0)
MCH: 30.9 pg (ref 26.0–34.0)
MCHC: 31.5 g/dL (ref 30.0–36.0)
MCV: 98.1 fL (ref 80.0–100.0)
Monocytes Absolute: 0.5 10*3/uL (ref 0.1–1.0)
Monocytes Relative: 11 %
Neutro Abs: 3.8 10*3/uL (ref 1.7–7.7)
Neutrophils Relative %: 80 %
Platelets: 279 10*3/uL (ref 150–400)
RBC: 3.72 MIL/uL — ABNORMAL LOW (ref 3.87–5.11)
RDW: 17.3 % — ABNORMAL HIGH (ref 11.5–15.5)
WBC: 4.7 10*3/uL (ref 4.0–10.5)
nRBC: 0 % (ref 0.0–0.2)

## 2020-07-22 MED ORDER — PALONOSETRON HCL INJECTION 0.25 MG/5ML
INTRAVENOUS | Status: AC
  Filled 2020-07-22: qty 5

## 2020-07-22 MED ORDER — CYCLOPHOSPHAMIDE CHEMO INJECTION 1 GM
400.0000 mg/m2 | Freq: Once | INTRAMUSCULAR | Status: AC
  Administered 2020-07-22: 880 mg via INTRAVENOUS
  Filled 2020-07-22: qty 44

## 2020-07-22 MED ORDER — PALONOSETRON HCL INJECTION 0.25 MG/5ML
0.2500 mg | Freq: Once | INTRAVENOUS | Status: AC
  Administered 2020-07-22: 0.25 mg via INTRAVENOUS

## 2020-07-22 MED ORDER — ZOLEDRONIC ACID 4 MG/100ML IV SOLN
INTRAVENOUS | Status: AC
  Filled 2020-07-22: qty 100

## 2020-07-22 MED ORDER — SODIUM CHLORIDE 0.9 % IV SOLN
Freq: Once | INTRAVENOUS | Status: AC
  Filled 2020-07-22: qty 250

## 2020-07-22 MED ORDER — BORTEZOMIB CHEMO SQ INJECTION 3.5 MG (2.5MG/ML)
1.3000 mg/m2 | Freq: Once | INTRAMUSCULAR | Status: AC
  Administered 2020-07-22: 2.75 mg via SUBCUTANEOUS
  Filled 2020-07-22: qty 1.1

## 2020-07-22 MED ORDER — SODIUM CHLORIDE 0.9 % IV SOLN
20.0000 mg | Freq: Once | INTRAVENOUS | Status: AC
  Administered 2020-07-22: 20 mg via INTRAVENOUS
  Filled 2020-07-22: qty 20

## 2020-07-22 MED ORDER — ZOLEDRONIC ACID 4 MG/100ML IV SOLN
4.0000 mg | Freq: Once | INTRAVENOUS | Status: AC
  Administered 2020-07-22: 4 mg via INTRAVENOUS

## 2020-07-22 NOTE — Patient Instructions (Signed)
[facility] ONCOLOGY    Discharge Instructions: Thank you for choosing [facility] to provide your oncology and hematology care.   If you have a lab appointment with the [facility], please go directly to the [facility] and check in at the registration area.   Wear comfortable clothing and clothing appropriate for easy access to any Portacath or PICC line.   We strive to give you quality time with your provider. You may need to reschedule your appointment if you arrive late (15 or more minutes).  Arriving late affects you and other patients whose appointments are after yours.  Also, if you miss three or more appointments without notifying the office, you may be dismissed from the clinic at the provider's discretion.      For prescription refill requests, have your pharmacy contact our office and allow 72 hours for refills to be completed.    Today you received the following chemotherapy and/or immunotherapy agents: bortezomib, cytoxan    To help prevent nausea and vomiting after your treatment, we encourage you to take your nausea medication as directed.  BELOW ARE SYMPTOMS THAT SHOULD BE REPORTED IMMEDIATELY: . *FEVER GREATER THAN 100.4 F (38 C) OR HIGHER . *CHILLS OR SWEATING . *NAUSEA AND VOMITING THAT IS NOT CONTROLLED WITH YOUR NAUSEA MEDICATION . *UNUSUAL SHORTNESS OF BREATH . *UNUSUAL BRUISING OR BLEEDING . *URINARY PROBLEMS (pain or burning when urinating, or frequent urination) . *BOWEL PROBLEMS (unusual diarrhea, constipation, pain near the anus) . TENDERNESS IN MOUTH AND THROAT WITH OR WITHOUT PRESENCE OF ULCERS (sore throat, sores in mouth, or a toothache) . UNUSUAL RASH, SWELLING OR PAIN  . UNUSUAL VAGINAL DISCHARGE OR ITCHING   Items with * indicate a potential emergency and should be followed up as soon as possible or go to the Emergency Department if any problems should occur.  Please show the CHEMOTHERAPY ALERT CARD or  IMMUNOTHERAPY ALERT CARD at check-in to the Emergency Department and triage nurse.  Should you have questions after your visit or need to cancel or reschedule your appointment, please contact [facility]  Dept: [phone]  and follow the prompts.  Office hours are 8:00 a.m. to 4:30 p.m. Monday - Friday. Please note that voicemails left after 4:00 p.m. may not be returned until the following business day.  We are closed weekends and major holidays. You have access to a nurse at all times for urgent questions. Please call the main number to the clinic Dept: [phone] and follow the prompts.   For any non-urgent questions, you may also contact your provider using MyChart. We now offer e-Visits for anyone 32 and older to request care online for non-urgent symptoms. For details visit [email or web address].   Also download the MyChart app! Go to the app store, search "MyChart", open the app, select [facility], and log in with your MyChart username and password.  Due to Covid, a mask is required upon entering the hospital/clinic. If you do not have a mask, one will be given to you upon arrival. For doctor visits, patients may have 1 support person aged 62 or older with them. For treatment visits, patients cannot have anyone with them due to current Covid guidelines and our immunocompromised population.

## 2020-07-23 DIAGNOSIS — R269 Unspecified abnormalities of gait and mobility: Secondary | ICD-10-CM | POA: Diagnosis not present

## 2020-07-23 DIAGNOSIS — C9000 Multiple myeloma not having achieved remission: Secondary | ICD-10-CM | POA: Diagnosis not present

## 2020-07-23 DIAGNOSIS — R531 Weakness: Secondary | ICD-10-CM | POA: Diagnosis not present

## 2020-07-25 ENCOUNTER — Inpatient Hospital Stay: Payer: BC Managed Care – PPO

## 2020-07-25 ENCOUNTER — Other Ambulatory Visit: Payer: Self-pay

## 2020-07-25 VITALS — BP 113/57 | HR 80 | Temp 98.7°F | Resp 16

## 2020-07-25 DIAGNOSIS — Z5111 Encounter for antineoplastic chemotherapy: Secondary | ICD-10-CM | POA: Diagnosis not present

## 2020-07-25 DIAGNOSIS — Z5112 Encounter for antineoplastic immunotherapy: Secondary | ICD-10-CM | POA: Diagnosis not present

## 2020-07-25 DIAGNOSIS — C9000 Multiple myeloma not having achieved remission: Secondary | ICD-10-CM | POA: Diagnosis not present

## 2020-07-25 DIAGNOSIS — Z79899 Other long term (current) drug therapy: Secondary | ICD-10-CM | POA: Diagnosis not present

## 2020-07-25 DIAGNOSIS — Z7189 Other specified counseling: Secondary | ICD-10-CM

## 2020-07-25 MED ORDER — PROCHLORPERAZINE MALEATE 10 MG PO TABS: 10.0000 mg | ORAL_TABLET | Freq: Once | ORAL | Status: DC

## 2020-07-25 MED ORDER — BORTEZOMIB CHEMO SQ INJECTION 3.5 MG (2.5MG/ML)
1.3000 mg/m2 | Freq: Once | INTRAMUSCULAR | Status: AC
  Administered 2020-07-25: 2.75 mg via SUBCUTANEOUS
  Filled 2020-07-25: qty 1.1

## 2020-07-25 NOTE — Patient Instructions (Signed)
[facility] MEDICAL ONCOLOGY   ?Discharge Instructions: ?Thank you for choosing [facility] to provide your oncology and hematology care.  ? ?If you have a lab appointment with the Cancer Center, please go directly to the Cancer Center and check in at the registration area. ?  ?Wear comfortable clothing and clothing appropriate for easy access to any Portacath or PICC line.  ? ?We strive to give you quality time with your provider. You may need to reschedule your appointment if you arrive late (15 or more minutes).  Arriving late affects you and other patients whose appointments are after yours.  Also, if you miss three or more appointments without notifying the office, you may be dismissed from the clinic at the provider?s discretion.    ?  ?For prescription refill requests, have your pharmacy contact our office and allow 72 hours for refills to be completed.   ? ?Today you received the following chemotherapy and/or immunotherapy agents: bortezomib    ?  ?To help prevent nausea and vomiting after your treatment, we encourage you to take your nausea medication as directed. ? ?BELOW ARE SYMPTOMS THAT SHOULD BE REPORTED IMMEDIATELY: ?*FEVER GREATER THAN 100.4 F (38 ?C) OR HIGHER ?*CHILLS OR SWEATING ?*NAUSEA AND VOMITING THAT IS NOT CONTROLLED WITH YOUR NAUSEA MEDICATION ?*UNUSUAL SHORTNESS OF BREATH ?*UNUSUAL BRUISING OR BLEEDING ?*URINARY PROBLEMS (pain or burning when urinating, or frequent urination) ?*BOWEL PROBLEMS (unusual diarrhea, constipation, pain near the anus) ?TENDERNESS IN MOUTH AND THROAT WITH OR WITHOUT PRESENCE OF ULCERS (sore throat, sores in mouth, or a toothache) ?UNUSUAL RASH, SWELLING OR PAIN  ?UNUSUAL VAGINAL DISCHARGE OR ITCHING  ? ?Items with * indicate a potential emergency and should be followed up as soon as possible or go to the Emergency Department if any problems should occur. ? ?Please show the CHEMOTHERAPY ALERT CARD or IMMUNOTHERAPY ALERT CARD at check-in  to the Emergency Department and triage nurse. ? ?Should you have questions after your visit or need to cancel or reschedule your appointment, please contact [facility] ONCOLOG[facility]  Dept: [phone]  and follow the prompts.  Office hours are 8:00 a.m. to 4:30 p.m. Monday - Friday. Please note that voicemails left after 4:00 p.m. may not be returned until the following business day.  We are closed weekends and major holidays. You have access to a nurse at all times for urgent questions. Please call the main number to the clinic Dept: [phone] and follow the prompts. ? ? ?For any non-urgent questions, you may also contact your provider using MyChart. We now offer e-Visits for anyone 18 and older to request care online for non-urgent symptoms. For details visit [email or web address]. ?  ?Also download the MyChart app! Go to the app store, search "MyChart", open the app, select [facility], and log in with your MyChart username and password. ? ?Due to Covid, a mask is required upon entering the hospital/clinic. If you do not have a mask, one will be given to you upon arrival. For doctor visits, patients may have 1 support person aged 18 or older with them. For treatment visits, patients cannot have anyone with them due to current Covid guidelines and our immunocompromised population.  ? ?

## 2020-07-26 DIAGNOSIS — C9000 Multiple myeloma not having achieved remission: Secondary | ICD-10-CM | POA: Diagnosis not present

## 2020-07-26 DIAGNOSIS — R269 Unspecified abnormalities of gait and mobility: Secondary | ICD-10-CM | POA: Diagnosis not present

## 2020-07-26 DIAGNOSIS — R531 Weakness: Secondary | ICD-10-CM | POA: Diagnosis not present

## 2020-07-29 ENCOUNTER — Other Ambulatory Visit: Payer: Self-pay

## 2020-07-29 ENCOUNTER — Other Ambulatory Visit: Payer: Self-pay | Admitting: Hematology

## 2020-07-29 ENCOUNTER — Inpatient Hospital Stay: Payer: BC Managed Care – PPO

## 2020-07-29 VITALS — BP 138/79 | HR 77 | Temp 98.6°F | Resp 18

## 2020-07-29 DIAGNOSIS — Z7189 Other specified counseling: Secondary | ICD-10-CM

## 2020-07-29 DIAGNOSIS — C9000 Multiple myeloma not having achieved remission: Secondary | ICD-10-CM | POA: Diagnosis not present

## 2020-07-29 DIAGNOSIS — Z79899 Other long term (current) drug therapy: Secondary | ICD-10-CM | POA: Diagnosis not present

## 2020-07-29 DIAGNOSIS — R609 Edema, unspecified: Secondary | ICD-10-CM

## 2020-07-29 DIAGNOSIS — C7951 Secondary malignant neoplasm of bone: Secondary | ICD-10-CM

## 2020-07-29 DIAGNOSIS — Z5111 Encounter for antineoplastic chemotherapy: Secondary | ICD-10-CM | POA: Diagnosis not present

## 2020-07-29 DIAGNOSIS — Z5112 Encounter for antineoplastic immunotherapy: Secondary | ICD-10-CM | POA: Diagnosis not present

## 2020-07-29 LAB — CBC WITH DIFFERENTIAL/PLATELET
Abs Immature Granulocytes: 0.03 10*3/uL (ref 0.00–0.07)
Basophils Absolute: 0 10*3/uL (ref 0.0–0.1)
Basophils Relative: 0 %
Eosinophils Absolute: 0.1 10*3/uL (ref 0.0–0.5)
Eosinophils Relative: 2 %
HCT: 35.2 % — ABNORMAL LOW (ref 36.0–46.0)
Hemoglobin: 11.2 g/dL — ABNORMAL LOW (ref 12.0–15.0)
Immature Granulocytes: 1 %
Lymphocytes Relative: 6 %
Lymphs Abs: 0.3 10*3/uL — ABNORMAL LOW (ref 0.7–4.0)
MCH: 30.7 pg (ref 26.0–34.0)
MCHC: 31.8 g/dL (ref 30.0–36.0)
MCV: 96.4 fL (ref 80.0–100.0)
Monocytes Absolute: 0.5 10*3/uL (ref 0.1–1.0)
Monocytes Relative: 11 %
Neutro Abs: 4.2 10*3/uL (ref 1.7–7.7)
Neutrophils Relative %: 80 %
Platelets: 169 10*3/uL (ref 150–400)
RBC: 3.65 MIL/uL — ABNORMAL LOW (ref 3.87–5.11)
RDW: 17.6 % — ABNORMAL HIGH (ref 11.5–15.5)
WBC: 5.1 10*3/uL (ref 4.0–10.5)
nRBC: 0 % (ref 0.0–0.2)

## 2020-07-29 LAB — CMP (CANCER CENTER ONLY)
ALT: 15 U/L (ref 0–44)
AST: 13 U/L — ABNORMAL LOW (ref 15–41)
Albumin: 3.4 g/dL — ABNORMAL LOW (ref 3.5–5.0)
Alkaline Phosphatase: 53 U/L (ref 38–126)
Anion gap: 12 (ref 5–15)
BUN: 12 mg/dL (ref 6–20)
CO2: 24 mmol/L (ref 22–32)
Calcium: 9.5 mg/dL (ref 8.9–10.3)
Chloride: 104 mmol/L (ref 98–111)
Creatinine: 0.72 mg/dL (ref 0.44–1.00)
GFR, Estimated: >60 mL/min (ref >60)
Glucose, Bld: 108 mg/dL — ABNORMAL HIGH (ref 70–99)
Potassium: 4.2 mmol/L (ref 3.5–5.1)
Sodium: 140 mmol/L (ref 135–145)
Total Bilirubin: 0.3 mg/dL (ref 0.3–1.2)
Total Protein: 6.2 g/dL — ABNORMAL LOW (ref 6.5–8.1)

## 2020-07-29 MED ORDER — SODIUM CHLORIDE 0.9 % IV SOLN
400.0000 mg/m2 | Freq: Once | INTRAVENOUS | Status: AC
  Administered 2020-07-29: 880 mg via INTRAVENOUS
  Filled 2020-07-29: qty 44

## 2020-07-29 MED ORDER — DEXAMETHASONE SODIUM PHOSPHATE 100 MG/10ML IJ SOLN
20.0000 mg | Freq: Once | INTRAMUSCULAR | Status: AC
  Administered 2020-07-29: 20 mg via INTRAVENOUS
  Filled 2020-07-29: qty 20

## 2020-07-29 MED ORDER — PALONOSETRON HCL INJECTION 0.25 MG/5ML
0.2500 mg | Freq: Once | INTRAVENOUS | Status: AC
  Administered 2020-07-29: 0.25 mg via INTRAVENOUS

## 2020-07-29 MED ORDER — PALONOSETRON HCL INJECTION 0.25 MG/5ML
INTRAVENOUS | Status: AC
  Filled 2020-07-29: qty 5

## 2020-07-29 MED ORDER — BORTEZOMIB CHEMO SQ INJECTION 3.5 MG (2.5MG/ML)
1.3000 mg/m2 | Freq: Once | INTRAMUSCULAR | Status: DC
  Filled 2020-07-29: qty 1.1

## 2020-07-29 MED ORDER — SODIUM CHLORIDE 0.9 % IV SOLN
Freq: Once | INTRAVENOUS | Status: AC
  Filled 2020-07-29: qty 250

## 2020-07-29 NOTE — Patient Instructions (Signed)
[facility] ONCOLOGY  Discharge Instructions: Thank you for choosing [facility] to provide your oncology and hematology care.   If you have a lab appointment with the [facility], please go directly to the [facility] and check in at the registration area.   Wear comfortable clothing and clothing appropriate for easy access to any Portacath or PICC line.   We strive to give you quality time with your provider. You may need to reschedule your appointment if you arrive late (15 or more minutes).  Arriving late affects you and other patients whose appointments are after yours.  Also, if you miss three or more appointments without notifying the office, you may be dismissed from the clinic at the provider's discretion.      For prescription refill requests, have your pharmacy contact our office and allow 72 hours for refills to be completed.    Today you received the following chemotherapy and/or immunotherapy agents Cytoxin   To help prevent nausea and vomiting after your treatment, we encourage you to take your nausea medication as directed.  BELOW ARE SYMPTOMS THAT SHOULD BE REPORTED IMMEDIATELY: *FEVER GREATER THAN 100.4 F (38 C) OR HIGHER *CHILLS OR SWEATING *NAUSEA AND VOMITING THAT IS NOT CONTROLLED WITH YOUR NAUSEA MEDICATION *UNUSUAL SHORTNESS OF BREATH *UNUSUAL BRUISING OR BLEEDING *URINARY PROBLEMS (pain or burning when urinating, or frequent urination) *BOWEL PROBLEMS (unusual diarrhea, constipation, pain near the anus) TENDERNESS IN MOUTH AND THROAT WITH OR WITHOUT PRESENCE OF ULCERS (sore throat, sores in mouth, or a toothache) UNUSUAL RASH, SWELLING OR PAIN  UNUSUAL VAGINAL DISCHARGE OR ITCHING   Items with * indicate a potential emergency and should be followed up as soon as possible or go to the Emergency Department if any problems should occur.  Please show the CHEMOTHERAPY ALERT CARD or IMMUNOTHERAPY ALERT CARD at check-in to the  Emergency Department and triage nurse.  Should you have questions after your visit or need to cancel or reschedule your appointment, please contact [facility]  Dept: [phone]  and follow the prompts.  Office hours are 8:00 a.m. to 4:30 p.m. Monday - Friday. Please note that voicemails left after 4:00 p.m. may not be returned until the following business day.  We are closed weekends and major holidays. You have access to a nurse at all times for urgent questions. Please call the main number to the clinic Dept: [phone] and follow the prompts.   For any non-urgent questions, you may also contact your provider using MyChart. We now offer e-Visits for anyone 55 and older to request care online for non-urgent symptoms. For details visit [email or web address].   Also download the MyChart app! Go to the app store, search "MyChart", open the app, select [facility], and log in with your MyChart username and password.  Due to Covid, a mask is required upon entering the hospital/clinic. If you do not have a mask, one will be given to you upon arrival. For doctor visits, patients may have 1 support person aged 61 or older with them. For treatment visits, patients cannot have anyone with them due to current Covid guidelines and our immunocompromised population.

## 2020-07-29 NOTE — Progress Notes (Signed)
Per dr. Lorenso Courier no velcade this week, as patient reports burning in bilateral legs from the knees down to the feet.

## 2020-07-30 DIAGNOSIS — R531 Weakness: Secondary | ICD-10-CM | POA: Diagnosis not present

## 2020-07-30 DIAGNOSIS — R269 Unspecified abnormalities of gait and mobility: Secondary | ICD-10-CM | POA: Diagnosis not present

## 2020-07-30 DIAGNOSIS — C9000 Multiple myeloma not having achieved remission: Secondary | ICD-10-CM | POA: Diagnosis not present

## 2020-08-01 ENCOUNTER — Ambulatory Visit: Payer: BC Managed Care – PPO

## 2020-08-02 DIAGNOSIS — R269 Unspecified abnormalities of gait and mobility: Secondary | ICD-10-CM | POA: Diagnosis not present

## 2020-08-02 DIAGNOSIS — C9000 Multiple myeloma not having achieved remission: Secondary | ICD-10-CM | POA: Diagnosis not present

## 2020-08-02 DIAGNOSIS — R531 Weakness: Secondary | ICD-10-CM | POA: Diagnosis not present

## 2020-08-06 ENCOUNTER — Other Ambulatory Visit: Payer: Self-pay | Admitting: Hematology

## 2020-08-06 DIAGNOSIS — R531 Weakness: Secondary | ICD-10-CM | POA: Diagnosis not present

## 2020-08-06 DIAGNOSIS — R269 Unspecified abnormalities of gait and mobility: Secondary | ICD-10-CM | POA: Diagnosis not present

## 2020-08-06 DIAGNOSIS — Z7189 Other specified counseling: Secondary | ICD-10-CM

## 2020-08-06 DIAGNOSIS — C9000 Multiple myeloma not having achieved remission: Secondary | ICD-10-CM

## 2020-08-07 DIAGNOSIS — M8458XG Pathological fracture in neoplastic disease, other specified site, subsequent encounter for fracture with delayed healing: Secondary | ICD-10-CM | POA: Diagnosis not present

## 2020-08-07 DIAGNOSIS — Z6841 Body Mass Index (BMI) 40.0 and over, adult: Secondary | ICD-10-CM | POA: Diagnosis not present

## 2020-08-09 DIAGNOSIS — C9000 Multiple myeloma not having achieved remission: Secondary | ICD-10-CM | POA: Diagnosis not present

## 2020-08-09 DIAGNOSIS — R269 Unspecified abnormalities of gait and mobility: Secondary | ICD-10-CM | POA: Diagnosis not present

## 2020-08-09 DIAGNOSIS — R531 Weakness: Secondary | ICD-10-CM | POA: Diagnosis not present

## 2020-08-12 ENCOUNTER — Other Ambulatory Visit: Payer: Self-pay

## 2020-08-12 ENCOUNTER — Other Ambulatory Visit: Payer: Self-pay | Admitting: Hematology and Oncology

## 2020-08-12 ENCOUNTER — Telehealth: Payer: Self-pay | Admitting: Hematology

## 2020-08-12 ENCOUNTER — Other Ambulatory Visit: Payer: Self-pay | Admitting: Hematology

## 2020-08-12 DIAGNOSIS — Z7189 Other specified counseling: Secondary | ICD-10-CM

## 2020-08-12 DIAGNOSIS — R531 Weakness: Secondary | ICD-10-CM | POA: Diagnosis not present

## 2020-08-12 DIAGNOSIS — C9000 Multiple myeloma not having achieved remission: Secondary | ICD-10-CM

## 2020-08-12 DIAGNOSIS — C7951 Secondary malignant neoplasm of bone: Secondary | ICD-10-CM

## 2020-08-12 DIAGNOSIS — R609 Edema, unspecified: Secondary | ICD-10-CM

## 2020-08-12 DIAGNOSIS — R269 Unspecified abnormalities of gait and mobility: Secondary | ICD-10-CM | POA: Diagnosis not present

## 2020-08-12 MED ORDER — LORAZEPAM 0.5 MG PO TABS: ORAL_TABLET | ORAL | 0 refills | Status: DC

## 2020-08-12 MED ORDER — OXYCODONE HCL 5 MG PO TABS: ORAL_TABLET | ORAL | 0 refills | Status: DC

## 2020-08-12 NOTE — Progress Notes (Signed)
Stacey Montoya   Telephone:([phone]) [phone] Fax:(336) [phone]   Clinic Follow up Note   Patient Care Team: Stacey Squibb, MD as PCP - General (Internal Medicine) Stacey Mckusick, DO as Consulting Physician (Interventional Radiology)  Date of Service:  08/13/2020  CHIEF COMPLAINT: F/u of Multiple Myeloma   SUMMARY OF ONCOLOGIC HISTORY: Oncology History  Multiple myeloma ([place])  04/02/2020 Initial Diagnosis   Multiple myeloma ([place])    04/08/2020 -  Chemotherapy    Patient is on Treatment Plan: MYELOMA NEWLY DIAGNOSED TRANSPLANT CANDIDATE CYCLOPHOSPHAMIDE IV + BORTEZOMIB SQ + DEXAMETHASONE (CYBORD) Q21D X 4 CYCLES          CURRENT THERAPY:  CyBorD starting 04/08/20   INTERVAL HISTORY:  Stacey Montoya is here for a follow up of MM. She is under the care of Dr. Irene Montoya and I am seeing her in interim. She was last seen by Dr Stacey Montoya on 07/01/20. She presents to the clinic with her husband, she uses a walker. Burning and tingling at bilateral leg and feet for a month, it's progressively worse, especially at night, she feels it is on fire.  She is on gabapentin 300 mg in the morning and 600 mg in the evening, which helps some.  She uses a walker, no recent fall.  Neuropathy in hands are minimal. Her back pain is stable, she was seen by neurosurgeon last week, plan to have kyphoplasty in near future. She has also seen [facility], will follow up soon to finalize her transplant plan.   All other systems were reviewed with the patient and are negative.  MEDICAL HISTORY:  Past Medical History:  Diagnosis Date   Abnormal Pap smear of cervix    Back pain    Hyperlipidemia     SURGICAL HISTORY: Past Surgical History:  Procedure Laterality Date   CERVICAL CONIZATION W/BX  12/02/2010   Procedure: CONIZATION CERVIX WITH BIOPSY;  Surgeon: Stacey Montoya;  Location: [facility];  Service: Gynecology;  Laterality: N/A;  COLD KNIFE   CESAREAN SECTION     DILATION  AND CURETTAGE OF UTERUS  12/02/2010   Procedure: DILATATION AND CURETTAGE (D&C);  Surgeon: Stacey Montoya;  Location: [facility];  Service: Gynecology;  Laterality: N/A;   IR RADIOLOGIST EVAL & MGMT  04/16/2020   left hand     drain - infection   WISDOM TOOTH EXTRACTION      I have reviewed the social history and family history with the patient and they are unchanged from previous note.  ALLERGIES:  is allergic to zithromax [azithromycin].  MEDICATIONS:  Current Outpatient Medications  Medication Sig Dispense Refill   acyclovir (ZOVIRAX) 400 MG tablet TAKE (1) TABLET BY MOUTH TWICE DAILY. 60 tablet 0   albuterol (VENTOLIN HFA) 108 (90 Base) MCG/ACT inhaler Inhale 1-2 puffs into the lungs as needed for wheezing.     amLODipine (NORVASC) 5 MG tablet Take 1 tablet (5 mg total) by mouth daily. 30 tablet 0   apixaban (ELIQUIS) 5 MG TABS tablet Take 1 tablet (5 mg total) by mouth 2 (two) times daily. 60 tablet 2   b complex vitamins capsule Take 1 capsule by mouth daily. 30 capsule    dexamethasone (DECADRON) 4 MG tablet TAKE 10 TABLETS ON DAY 15 OF EACH CYCLE. REPEAT EVERY 21 DAYS. TAKE WITH BREAKFAST. 10 tablet 0   ergocalciferol (VITAMIN D2) 1.25 MG (50000 UT) capsule Take 1 capsule (50,000 Units total) by mouth 2 (two) times a week.  12 capsule 2   fentaNYL (DURAGESIC) 25 MCG/HR PLACE 1 PATCH ONTO SKIN EVERY 3 DAYS. 10 patch 0   furosemide (LASIX) 20 MG tablet Take 3 tablets (60 mg total) by mouth daily. 90 tablet 0   gabapentin (NEURONTIN) 300 MG capsule Plz take 386m (1 cap)PO in the morning and 9038m(3 caps) po at bedtime 120 capsule 1   LORazepam (ATIVAN) 0.5 MG tablet Take 1 tablet every 6 hours as needed. 30 tablet 0   magic mouthwash w/lidocaine SOLN Dispense 40025mf magic mouthwash compounded preparation. Patient to use 5ml67mtimes daily as needed for throat discomfort. 80 ml viscous lidocaine 2% 80 ml Mylanta 80 ml diphenhydramine at 12.5 mg per 5 ml elixir 80 ml nystatin at 100,000U  per 5 mL suspension 80 ml distilled water 400 mL 1   medroxyPROGESTERone (PROVERA) 10 MG tablet daily.     metoprolol tartrate (LOPRESSOR) 50 MG tablet Take 1 tablet (50 mg total) by mouth 2 (two) times daily. 60 tablet 0   nicotine (NICODERM CQ - DOSED IN MG/24 HOURS) 21 mg/24hr patch Place 1 patch (21 mg total) onto the skin daily. 28 patch 0   norethindrone (MICRONOR) 0.35 MG tablet Take 1 tablet by mouth daily.     norethindrone (MICRONOR,CAMILA,ERRIN) 0.35 MG tablet Take 1 tablet by mouth daily.     ondansetron (ZOFRAN) 4 MG tablet Take 4 mg by mouth every 8 (eight) hours.     ondansetron (ZOFRAN) 8 MG tablet Take 1 tablet (8 mg total) by mouth 2 (two) times daily as needed for refractory nausea / vomiting. Start on day 3 after Cytoxan. 30 tablet 1   oxyCODONE (OXY IR/ROXICODONE) 5 MG immediate release tablet TAKE 1 OR 2 TABLETS BY MOUTH EVERY 6 HOURS AS NEEDED 60 tablet 0   polyethylene glycol (MIRALAX / GLYCOLAX) 17 g packet Take 17 g by mouth daily. 14 each 0   potassium chloride SA (KLOR-CON) 20 MEQ tablet Take 1 tablet (20 mEq total) by mouth 2 (two) times daily. 60 tablet 1   prochlorperazine (COMPAZINE) 10 MG tablet Take 1 tablet (10 mg total) by mouth every 6 (six) hours as needed (Nausea or vomiting). 30 tablet 1   No current facility-administered medications for this visit.    PHYSICAL EXAMINATION: ECOG PERFORMANCE STATUS: 2 - Symptomatic, <50% confined to bed  Vitals:   08/13/20 0824  BP: 126/71  Pulse: 75  Resp: 18  Temp: 97.8 F (36.6 C)  SpO2: 99%   Filed Weights   08/13/20 0824  Weight: 261 lb 3.2 oz (118.5 kg)    GENERAL:alert, no distress and comfortable, round face due to steroid  SKIN: skin color, texture, turgor are normal, no rashes or significant lesions EYES: normal, Conjunctiva are pink and non-injected, sclera clear NECK: supple, thyroid normal size, non-tender, without nodularity LYMPH:  no palpable lymphadenopathy in the cervical, axillary   LUNGS: clear to auscultation and percussion with normal breathing effort HEART: regular rate & rhythm and no murmurs and no lower extremity edema ABDOMEN:abdomen soft, non-tender and normal bowel sounds Musculoskeletal:no cyanosis of digits and no clubbing  NEURO: alert & oriented x 3 with fluent speech, no focal motor deficit.  She has symmetric moderate decreased vibration sensation on bilateral ankle, normal on hands.  LABORATORY DATA:  I have reviewed the data as listed CBC Latest Ref Rng & Units 08/13/2020 07/29/2020 07/22/2020  WBC 4.0 - 10.5 K/uL 4.1 5.1 4.7  Hemoglobin 12.0 - 15.0 g/dL 11.7(L) 11.2(L) 11.5(L)  Hematocrit 36.0 - 46.0 % 36.6 35.2(L) 36.5  Platelets 150 - 400 K/uL 245 169 279     CMP Latest Ref Rng & Units 08/13/2020 07/29/2020 07/22/2020  Glucose 70 - 99 mg/dL 123(H) 108(H) 141(H)  BUN 6 - 20 mg/dL 16 12 14   Creatinine 0.44 - 1.00 mg/dL 0.78 0.72 0.77  Sodium 135 - 145 mmol/L 139 140 141  Potassium 3.5 - 5.1 mmol/L 4.1 4.2 3.5  Chloride 98 - 111 mmol/L 104 104 102  CO2 22 - 32 mmol/L 23 24 25   Calcium 8.9 - 10.3 mg/dL 9.6 9.5 9.8  Total Protein 6.5 - 8.1 g/dL 6.4(L) 6.2(L) 6.6  Total Bilirubin 0.3 - 1.2 mg/dL 0.4 0.3 0.4  Alkaline Phos 38 - 126 U/L 51 53 54  AST 15 - 41 U/L 15 13(L) 11(L)  ALT 0 - 44 U/L 19 15 17       RADIOGRAPHIC STUDIES: I have personally reviewed the radiological images as listed and agreed with the findings in the report. No results found.   ASSESSMENT & PLAN:  Stacey Montoya is a 44 y.o. 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  Velcade has been held since last week, I will continue hold it -We  will continue weekly Cytoxan and dexamethasone for now -Repeat multiple myeloma lab on next visit -She is close to autologous bone marrow transplant, may not need changing her chemo regimen.  Her creatinine has normalized, she does have many other treatment options, including Revlimid, daratumumab etc. -f/u with Dr. Irene Montoya next week    2.  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 -Given her extensive bone disease, normal renal function, I will start her on Zometa monthly   3. anemia  -Secondary to multiple myeloma and chemotherapy -Overall stable  4.  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 Irene Montoya      No problem-specific Assessment & Plan notes found for this encounter.   No orders of the defined types were placed in this encounter.  All questions were answered. The patient knows to call the clinic with any problems, questions or concerns. No barriers to learning was detected. The total time spent in the appointment was 30 minutes.     Truitt Merle, MD 08/13/2020   I, Joslyn Devon, am acting as scribe for Truitt Merle, MD.   I have reviewed the above documentation for accuracy and completeness, and I agree with the above.

## 2020-08-12 NOTE — Telephone Encounter (Signed)
Called to inform patient that tomorrow's MD appointment changed provider's due to provider out of the office. Patient is aware of changes.

## 2020-08-13 ENCOUNTER — Inpatient Hospital Stay: Payer: BC Managed Care – PPO

## 2020-08-13 ENCOUNTER — Other Ambulatory Visit: Payer: Self-pay

## 2020-08-13 ENCOUNTER — Ambulatory Visit: Payer: BC Managed Care – PPO | Admitting: Hematology

## 2020-08-13 ENCOUNTER — Inpatient Hospital Stay: Payer: BC Managed Care – PPO | Admitting: Hematology

## 2020-08-13 ENCOUNTER — Ambulatory Visit: Payer: BC Managed Care – PPO | Admitting: Physician Assistant

## 2020-08-13 ENCOUNTER — Inpatient Hospital Stay: Payer: BC Managed Care – PPO | Admitting: Physician Assistant

## 2020-08-13 DIAGNOSIS — R609 Edema, unspecified: Secondary | ICD-10-CM

## 2020-08-13 DIAGNOSIS — Z5112 Encounter for antineoplastic immunotherapy: Secondary | ICD-10-CM | POA: Diagnosis not present

## 2020-08-13 DIAGNOSIS — C7951 Secondary malignant neoplasm of bone: Secondary | ICD-10-CM

## 2020-08-13 DIAGNOSIS — C9000 Multiple myeloma not having achieved remission: Secondary | ICD-10-CM

## 2020-08-13 DIAGNOSIS — Z5111 Encounter for antineoplastic chemotherapy: Secondary | ICD-10-CM | POA: Diagnosis not present

## 2020-08-13 DIAGNOSIS — Z79899 Other long term (current) drug therapy: Secondary | ICD-10-CM | POA: Diagnosis not present

## 2020-08-13 DIAGNOSIS — Z7189 Other specified counseling: Secondary | ICD-10-CM

## 2020-08-13 LAB — CMP (CANCER CENTER ONLY)
ALT: 19 U/L (ref 0–44)
AST: 15 U/L (ref 15–41)
Albumin: 3.5 g/dL (ref 3.5–5.0)
Alkaline Phosphatase: 51 U/L (ref 38–126)
Anion gap: 12 (ref 5–15)
BUN: 16 mg/dL (ref 6–20)
CO2: 23 mmol/L (ref 22–32)
Calcium: 9.6 mg/dL (ref 8.9–10.3)
Chloride: 104 mmol/L (ref 98–111)
Creatinine: 0.78 mg/dL (ref 0.44–1.00)
GFR, Estimated: >60 mL/min (ref >60)
Glucose, Bld: 123 mg/dL — ABNORMAL HIGH (ref 70–99)
Potassium: 4.1 mmol/L (ref 3.5–5.1)
Sodium: 139 mmol/L (ref 135–145)
Total Bilirubin: 0.4 mg/dL (ref 0.3–1.2)
Total Protein: 6.4 g/dL — ABNORMAL LOW (ref 6.5–8.1)

## 2020-08-13 LAB — CBC WITH DIFFERENTIAL/PLATELET
Abs Immature Granulocytes: 0.01 10*3/uL (ref 0.00–0.07)
Basophils Absolute: 0 10*3/uL (ref 0.0–0.1)
Basophils Relative: 0 %
Eosinophils Absolute: 0.1 10*3/uL (ref 0.0–0.5)
Eosinophils Relative: 3 %
HCT: 36.6 % (ref 36.0–46.0)
Hemoglobin: 11.7 g/dL — ABNORMAL LOW (ref 12.0–15.0)
Immature Granulocytes: 0 %
Lymphocytes Relative: 5 %
Lymphs Abs: 0.2 10*3/uL — ABNORMAL LOW (ref 0.7–4.0)
MCH: 30.3 pg (ref 26.0–34.0)
MCHC: 32 g/dL (ref 30.0–36.0)
MCV: 94.8 fL (ref 80.0–100.0)
Monocytes Absolute: 0.8 10*3/uL (ref 0.1–1.0)
Monocytes Relative: 18 %
Neutro Abs: 3 10*3/uL (ref 1.7–7.7)
Neutrophils Relative %: 74 %
Platelets: 245 10*3/uL (ref 150–400)
RBC: 3.86 MIL/uL — ABNORMAL LOW (ref 3.87–5.11)
RDW: 17.7 % — ABNORMAL HIGH (ref 11.5–15.5)
WBC: 4.1 10*3/uL (ref 4.0–10.5)
nRBC: 0 % (ref 0.0–0.2)

## 2020-08-13 MED ORDER — BORTEZOMIB CHEMO SQ INJECTION 3.5 MG (2.5MG/ML): 1.3000 mg/m2 | Freq: Once | INTRAMUSCULAR | Status: DC

## 2020-08-13 MED ORDER — SODIUM CHLORIDE 0.9 % IV SOLN
400.0000 mg/m2 | Freq: Once | INTRAVENOUS | Status: AC
  Administered 2020-08-13: 880 mg via INTRAVENOUS
  Filled 2020-08-13: qty 44

## 2020-08-13 MED ORDER — GABAPENTIN 300 MG PO CAPS: ORAL_CAPSULE | ORAL | 1 refills | Status: DC

## 2020-08-13 MED ORDER — SODIUM CHLORIDE 0.9 % IV SOLN
Freq: Once | INTRAVENOUS | Status: DC
  Filled 2020-08-13: qty 250

## 2020-08-13 MED ORDER — PALONOSETRON HCL INJECTION 0.25 MG/5ML
INTRAVENOUS | Status: AC
  Filled 2020-08-13: qty 5

## 2020-08-13 MED ORDER — PALONOSETRON HCL INJECTION 0.25 MG/5ML
0.2500 mg | Freq: Once | INTRAVENOUS | Status: AC
Start: 2020-08-13 — End: 2020-08-13
  Administered 2020-08-13: 0.25 mg via INTRAVENOUS

## 2020-08-13 MED ORDER — SODIUM CHLORIDE 0.9 % IV SOLN
20.0000 mg | Freq: Once | INTRAVENOUS | Status: AC
  Administered 2020-08-13: 20 mg via INTRAVENOUS
  Filled 2020-08-13: qty 20

## 2020-08-13 MED ORDER — FUROSEMIDE 20 MG PO TABS: 60.0000 mg | ORAL_TABLET | Freq: Every day | ORAL | 0 refills | Status: DC

## 2020-08-13 MED ORDER — PROCHLORPERAZINE MALEATE 10 MG PO TABS: 10.0000 mg | ORAL_TABLET | Freq: Once | ORAL | Status: DC

## 2020-08-13 NOTE — Progress Notes (Signed)
Per Dr. Burr Medico (Dr. Irene Limbo on PAL), no Velcade today (C6D1). C6D4 will be omitted as well.  Kennith Center, Pharm.D., CPP 08/13/2020@10 :22 AM

## 2020-08-13 NOTE — Patient Instructions (Signed)
[facility] ONCOLOGY  Discharge Instructions: Thank you for choosing [facility] to provide your oncology and hematology care.   If you have a lab appointment with the [facility], please go directly to the [facility] and check in at the registration area.   Wear comfortable clothing and clothing appropriate for easy access to any Portacath or PICC line.   We strive to give you quality time with your provider. You may need to reschedule your appointment if you arrive late (15 or more minutes).  Arriving late affects you and other patients whose appointments are after yours.  Also, if you miss three or more appointments without notifying the office, you may be dismissed from the clinic at the provider's discretion.      For prescription refill requests, have your pharmacy contact our office and allow 72 hours for refills to be completed.    Today you received the following chemotherapy and/or immunotherapy agents Cytoxin   To help prevent nausea and vomiting after your treatment, we encourage you to take your nausea medication as directed.  BELOW ARE SYMPTOMS THAT SHOULD BE REPORTED IMMEDIATELY: *FEVER GREATER THAN 100.4 F (38 C) OR HIGHER *CHILLS OR SWEATING *NAUSEA AND VOMITING THAT IS NOT CONTROLLED WITH YOUR NAUSEA MEDICATION *UNUSUAL SHORTNESS OF BREATH *UNUSUAL BRUISING OR BLEEDING *URINARY PROBLEMS (pain or burning when urinating, or frequent urination) *BOWEL PROBLEMS (unusual diarrhea, constipation, pain near the anus) TENDERNESS IN MOUTH AND THROAT WITH OR WITHOUT PRESENCE OF ULCERS (sore throat, sores in mouth, or a toothache) UNUSUAL RASH, SWELLING OR PAIN  UNUSUAL VAGINAL DISCHARGE OR ITCHING   Items with * indicate a potential emergency and should be followed up as soon as possible or go to the Emergency Department if any problems should occur.  Please show the CHEMOTHERAPY ALERT CARD or IMMUNOTHERAPY ALERT CARD at check-in to the  Emergency Department and triage nurse.  Should you have questions after your visit or need to cancel or reschedule your appointment, please contact [facility]  Dept: [phone]  and follow the prompts.  Office hours are 8:00 a.m. to 4:30 p.m. Monday - Friday. Please note that voicemails left after 4:00 p.m. may not be returned until the following business day.  We are closed weekends and major holidays. You have access to a nurse at all times for urgent questions. Please call the main number to the clinic Dept: [phone] and follow the prompts.   For any non-urgent questions, you may also contact your provider using MyChart. We now offer e-Visits for anyone 65 and older to request care online for non-urgent symptoms. For details visit [email or web address].   Also download the MyChart app! Go to the app store, search "MyChart", open the app, select [facility], and log in with your MyChart username and password.  Due to Covid, a mask is required upon entering the hospital/clinic. If you do not have a mask, one will be given to you upon arrival. For doctor visits, patients may have 1 support person aged 73 or older with them. For treatment visits, patients cannot have anyone with them due to current Covid guidelines and our immunocompromised population.

## 2020-08-14 ENCOUNTER — Telehealth: Payer: Self-pay | Admitting: Hematology

## 2020-08-14 ENCOUNTER — Encounter: Payer: Self-pay | Admitting: Hematology

## 2020-08-14 DIAGNOSIS — C9000 Multiple myeloma not having achieved remission: Secondary | ICD-10-CM | POA: Diagnosis not present

## 2020-08-14 DIAGNOSIS — R531 Weakness: Secondary | ICD-10-CM | POA: Diagnosis not present

## 2020-08-14 DIAGNOSIS — R269 Unspecified abnormalities of gait and mobility: Secondary | ICD-10-CM | POA: Diagnosis not present

## 2020-08-14 NOTE — Telephone Encounter (Signed)
Cancelled upcoming appointments per 6/28 los. Patient is aware.

## 2020-08-16 ENCOUNTER — Encounter: Payer: Self-pay | Admitting: Hematology

## 2020-08-16 ENCOUNTER — Ambulatory Visit: Payer: BC Managed Care – PPO

## 2020-08-19 NOTE — Progress Notes (Signed)
HEMATOLOGY/ONCOLOGY CLINIC NOTE  Date of Service: 08/20/2020  Patient Care Team: Celene Squibb, MD as PCP - General (Internal Medicine) Corrie Mckusick, DO as Consulting Physician (Interventional Radiology)  CHIEF COMPLAINTS/PURPOSE OF CONSULTATION:  Multiple Myeloma- continued mx  HISTORY OF PRESENTING ILLNESS:  Please see previous note for HPI.  INTERVAL HISTORY:  Stacey Montoya is a wonderful 45 y.o. female who is here today for evaluation and management of recently diagnosed Myeloma. The patient's last visit with [place] was inpatient on 07/01/2020.  The pt reports that she is doing well overall. She is here for C7D8 CyD.   The patient saw Dr. Burr Medico in the interim on 08/13/2020.  The pt reports that she has seen nerosurgery and has recommended surgery for both her L4 and D12. She notes that she has and appointment with Dr. Aris Lot on July 19. She has been on her walker for over a month and no longer using the wheelchair. The pain is still there and she has decreased stamina with standing, but notes the PT has helped with mobility and strength. She still has been having the neuropathy, worsened at night. The burning sensation is in both legs from the knee down, starting around 7PM at night. She is currently on 300 mg in the morning and 900 mg at night of the gabapentin. The combo of the pain medication and gabapentin is what is helping the patient.  The pt notes she will try to get her bone cement surgery scheduled. She is waiting on her new insurance.  Lab results today 08/20/2020 of CBC w/diff and CMP is as follows: all values are WNL except for Hgb of 11.9, RDW of 17.8, Lymphs Abs of 0.2K, Glucose of 110, Total protein of 6.4, AST of 12.  On review of systems, pt reports neuropathy, leg swelling b/l, chronic back pain and denies abdominal pain, thigh pain, difficulty moving bowels, and any other symptoms.   MEDICAL HISTORY:  Past Medical History:  Diagnosis Date   Abnormal Pap  smear of cervix    Back pain    Hyperlipidemia     SURGICAL HISTORY: Past Surgical History:  Procedure Laterality Date   CERVICAL CONIZATION W/BX  12/02/2010   Procedure: CONIZATION CERVIX WITH BIOPSY;  Surgeon: Arloa Koh;  Location: [facility];  Service: Gynecology;  Laterality: N/A;  COLD KNIFE   CESAREAN SECTION     DILATION AND CURETTAGE OF UTERUS  12/02/2010   Procedure: DILATATION AND CURETTAGE (D&C);  Surgeon: Arloa Koh;  Location: [facility];  Service: Gynecology;  Laterality: N/A;   IR RADIOLOGIST EVAL & MGMT  04/16/2020   left hand     drain - infection   WISDOM TOOTH EXTRACTION      SOCIAL HISTORY: Social History   Socioeconomic History   Marital status: Married    Spouse name: Not on file   Number of children: Not on file   Years of education: Not on file   Highest education level: Not on file  Occupational History   Not on file  Tobacco Use   Smoking status: Every Day    Packs/day: 0.50    Years: 20.00    Pack years: 10.00    Types: Cigarettes   Smokeless tobacco: Never  Vaping Use   Vaping Use: Every day  Substance and Sexual Activity   Alcohol use: Not Currently    Comment: socially - 6 beers per month   Drug use: Not Currently   Sexual activity: Yes  Partners: Male    Birth control/protection: Condom  Other Topics Concern   Not on file  Social History Narrative   ** Merged History Encounter **       Social Determinants of Health   Financial Resource Strain: Not on file  Food Insecurity: Not on file  Transportation Needs: Not on file  Physical Activity: Not on file  Stress: Not on file  Social Connections: Not on file  Intimate Partner Violence: Not on file    FAMILY HISTORY: Family History  Problem Relation Age of Onset   Lung cancer Maternal Grandfather    Pancreatic cancer Paternal Grandfather     ALLERGIES:  is allergic to zithromax [azithromycin].  MEDICATIONS:  Current Outpatient Medications  Medication Sig Dispense Refill    acyclovir (ZOVIRAX) 400 MG tablet TAKE (1) TABLET BY MOUTH TWICE DAILY. 60 tablet 0   albuterol (VENTOLIN HFA) 108 (90 Base) MCG/ACT inhaler Inhale 1-2 puffs into the lungs as needed for wheezing.     amLODipine (NORVASC) 5 MG tablet Take 1 tablet (5 mg total) by mouth daily. 30 tablet 0   apixaban (ELIQUIS) 5 MG TABS tablet Take 1 tablet (5 mg total) by mouth 2 (two) times daily. 60 tablet 2   b complex vitamins capsule Take 1 capsule by mouth daily. 30 capsule    dexamethasone (DECADRON) 4 MG tablet TAKE 10 TABLETS ON DAY 15 OF EACH CYCLE. REPEAT EVERY 21 DAYS. TAKE WITH BREAKFAST. 10 tablet 0   ergocalciferol (VITAMIN D2) 1.25 MG (50000 UT) capsule Take 1 capsule (50,000 Units total) by mouth 2 (two) times a week. 12 capsule 2   fentaNYL (DURAGESIC) 25 MCG/HR PLACE 1 PATCH ONTO SKIN EVERY 3 DAYS. 10 patch 0   furosemide (LASIX) 20 MG tablet Take 3 tablets (60 mg total) by mouth daily. 90 tablet 0   gabapentin (NEURONTIN) 300 MG capsule Plz take 375m (1 cap)PO in the morning and 9031m(3 caps) po at bedtime 120 capsule 1   LORazepam (ATIVAN) 0.5 MG tablet Take 1 tablet every 6 hours as needed. 30 tablet 0   magic mouthwash w/lidocaine SOLN Dispense 40069mf magic mouthwash compounded preparation. Patient to use 5ml65mtimes daily as needed for throat discomfort. 80 ml viscous lidocaine 2% 80 ml Mylanta 80 ml diphenhydramine at 12.5 mg per 5 ml elixir 80 ml nystatin at 100,000U per 5 mL suspension 80 ml distilled water 400 mL 1   medroxyPROGESTERone (PROVERA) 10 MG tablet daily.     metoprolol tartrate (LOPRESSOR) 50 MG tablet Take 1 tablet (50 mg total) by mouth 2 (two) times daily. 60 tablet 0   nicotine (NICODERM CQ - DOSED IN MG/24 HOURS) 21 mg/24hr patch Place 1 patch (21 mg total) onto the skin daily. 28 patch 0   norethindrone (MICRONOR) 0.35 MG tablet Take 1 tablet by mouth daily.     norethindrone (MICRONOR,CAMILA,ERRIN) 0.35 MG tablet Take 1 tablet by mouth daily.     ondansetron  (ZOFRAN) 4 MG tablet Take 4 mg by mouth every 8 (eight) hours.     ondansetron (ZOFRAN) 8 MG tablet Take 1 tablet (8 mg total) by mouth 2 (two) times daily as needed for refractory nausea / vomiting. Start on day 3 after Cytoxan. 30 tablet 1   oxyCODONE (OXY IR/ROXICODONE) 5 MG immediate release tablet TAKE 1 OR 2 TABLETS BY MOUTH EVERY 6 HOURS AS NEEDED 60 tablet 0   polyethylene glycol (MIRALAX / GLYCOLAX) 17 g packet Take 17 g by mouth  daily. 14 each 0   potassium chloride SA (KLOR-CON) 20 MEQ tablet Take 1 tablet (20 mEq total) by mouth 2 (two) times daily. 60 tablet 1   prochlorperazine (COMPAZINE) 10 MG tablet Take 1 tablet (10 mg total) by mouth every 6 (six) hours as needed (Nausea or vomiting). 30 tablet 1   No current facility-administered medications for this visit.   Facility-Administered Medications Ordered in Other Visits  Medication Dose Route Frequency Provider Last Rate Last Admin   cyclophosphamide (CYTOXAN) 880 mg in sodium chloride 0.9 % 250 mL chemo infusion  400 mg/m2 (Treatment Plan Recorded) Intravenous Once Truitt Merle, MD       dexamethasone (DECADRON) 20 mg in sodium chloride 0.9 % 50 mL IVPB  20 mg Intravenous Once Truitt Merle, MD       palonosetron (ALOXI) injection 0.25 mg  0.25 mg Intravenous Once Truitt Merle, MD        REVIEW OF SYSTEMS:   10 Point review of Systems was done is negative except as noted above.  PHYSICAL EXAMINATION: ECOG PERFORMANCE STATUS: 2 - Symptomatic, <50% confined to bed VS stable  Exam was given in bed in infusion.  GENERAL:alert, in no acute distress and comfortable SKIN: no acute rashes, no significant lesions EYES: conjunctiva are pink and non-injected, sclera anicteric OROPHARYNX: MMM, no exudates, no oropharyngeal erythema or ulceration NECK: supple, no JVD LYMPH:  no palpable lymphadenopathy in the cervical, axillary or inguinal regions LUNGS: clear to auscultation b/l with normal respiratory effort HEART: regular rate &  rhythm ABDOMEN:  normoactive bowel sounds , non tender, not distended. Extremity: trace leg swelling b/l (worse on left) PSYCH: alert & oriented x 3 with fluent speech NEURO: no focal motor/sensory deficits   LABORATORY DATA:  I have reviewed the data as listed  . CBC Latest Ref Rng & Units 08/20/2020 08/13/2020 07/29/2020  WBC 4.0 - 10.5 K/uL 6.2 4.1 5.1  Hemoglobin 12.0 - 15.0 g/dL 11.9(L) 11.7(L) 11.2(L)  Hematocrit 36.0 - 46.0 % 36.5 36.6 35.2(L)  Platelets 150 - 400 K/uL 251 245 169   ANC1.6k  . CMP Latest Ref Rng & Units 08/20/2020 08/13/2020 07/29/2020  Glucose 70 - 99 mg/dL 110(H) 123(H) 108(H)  BUN 6 - 20 mg/dL 15 16 12   Creatinine 0.44 - 1.00 mg/dL 0.79 0.78 0.72  Sodium 135 - 145 mmol/L 141 139 140  Potassium 3.5 - 5.1 mmol/L 3.9 4.1 4.2  Chloride 98 - 111 mmol/L 104 104 104  CO2 22 - 32 mmol/L 26 23 24   Calcium 8.9 - 10.3 mg/dL 9.8 9.6 9.5  Total Protein 6.5 - 8.1 g/dL 6.4(L) 6.4(L) 6.2(L)  Total Bilirubin 0.3 - 1.2 mg/dL 0.4 0.4 0.3  Alkaline Phos 38 - 126 U/L 51 51 53  AST 15 - 41 U/L 12(L) 15 13(L)  ALT 0 - 44 U/L 19 19 15      RADIOGRAPHIC STUDIES: I have personally reviewed the radiological images as listed and agreed with the findings in the report. No results found.   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 3) anemia due to multiple myeloma becoming more apparent as the patient's hemoconcentration due to dehydration has been corrected.   4) acute renal failure related to dehydration  hypercalcemia and multiple myeloma.  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  -Advised pt we will continue to hold Velcade due to consistent neuropathy. -Advised pt the surgery of the back would most likely need to occur prior to the transplant. -Discussed options for Velcade replacement. We do not want to de-escalate treatment for an extended time. This will most likely be Daratumumab. -Will switch to Daratumumab due to adverse genetics and no timeline for transplant just yet. -Will send out myeloma panel again. -Advised pt the neuropathy is mostly reversible since we are stopping it early. -Continue Ergocalciferol twice weekly. -Continue Lasix at 40 mg. -Continue Cytoxan dosage at 400 mg/m^2. Will monitor Hgb and Plt levels for potential transfusion needs. -Continue Eliquis. -Refill Potassium, Eliquis, pain meds. -Will see back with C1D8 of new regiment in around two weeks with labs.   FOLLOW UP: Schedule to start new regimen with weekly Daratumumab/Cytoxan/Dexamethasone in 1 week with portflush and labs (plz schedule 4 doses) MD visit with 2nd weekly dose for toxicity check Continue Zometa q4weeks -- plz schedule next 3 doses     All of the patients questions were answered with apparent satisfaction. The patient knows to call the clinic with any problems, questions or concerns.   The total time spent in the appointment was 30 minutes and more than 50% was on counseling and direct patient cares.   Sullivan Lone MD [place] AAHIVMS [facility] Hematology/Oncology Physician [facility]  (Office):       [phone] (Work cell):  [phone] (Fax):           [phone]  08/20/2020 9:33 AM  I, Reinaldo Raddle, am acting as scribe for Dr. Sullivan Lone, MD.  .I have reviewed the above documentation for accuracy and  completeness, and I agree with the above. Brunetta Genera MD

## 2020-08-20 ENCOUNTER — Inpatient Hospital Stay: Payer: 59 | Attending: Hematology

## 2020-08-20 ENCOUNTER — Inpatient Hospital Stay (HOSPITAL_BASED_OUTPATIENT_CLINIC_OR_DEPARTMENT_OTHER): Payer: 59 | Admitting: Hematology

## 2020-08-20 ENCOUNTER — Other Ambulatory Visit: Payer: Self-pay

## 2020-08-20 ENCOUNTER — Inpatient Hospital Stay: Payer: 59

## 2020-08-20 ENCOUNTER — Other Ambulatory Visit: Payer: Self-pay | Admitting: Neurosurgery

## 2020-08-20 VITALS — BP 140/82 | HR 85 | Temp 98.9°F | Resp 18 | Wt 259.2 lb

## 2020-08-20 DIAGNOSIS — Z79899 Other long term (current) drug therapy: Secondary | ICD-10-CM | POA: Insufficient documentation

## 2020-08-20 DIAGNOSIS — C9000 Multiple myeloma not having achieved remission: Secondary | ICD-10-CM | POA: Insufficient documentation

## 2020-08-20 DIAGNOSIS — Z5111 Encounter for antineoplastic chemotherapy: Secondary | ICD-10-CM | POA: Diagnosis present

## 2020-08-20 DIAGNOSIS — C7951 Secondary malignant neoplasm of bone: Secondary | ICD-10-CM | POA: Diagnosis not present

## 2020-08-20 DIAGNOSIS — Z7189 Other specified counseling: Secondary | ICD-10-CM

## 2020-08-20 DIAGNOSIS — R609 Edema, unspecified: Secondary | ICD-10-CM

## 2020-08-20 LAB — CBC WITH DIFFERENTIAL/PLATELET
Abs Immature Granulocytes: 0.05 10*3/uL (ref 0.00–0.07)
Basophils Absolute: 0 10*3/uL (ref 0.0–0.1)
Basophils Relative: 0 %
Eosinophils Absolute: 0.1 10*3/uL (ref 0.0–0.5)
Eosinophils Relative: 2 %
HCT: 36.5 % (ref 36.0–46.0)
Hemoglobin: 11.9 g/dL — ABNORMAL LOW (ref 12.0–15.0)
Immature Granulocytes: 1 %
Lymphocytes Relative: 3 %
Lymphs Abs: 0.2 10*3/uL — ABNORMAL LOW (ref 0.7–4.0)
MCH: 30.7 pg (ref 26.0–34.0)
MCHC: 32.6 g/dL (ref 30.0–36.0)
MCV: 94.3 fL (ref 80.0–100.0)
Monocytes Absolute: 0.5 10*3/uL (ref 0.1–1.0)
Monocytes Relative: 8 %
Neutro Abs: 5.3 10*3/uL (ref 1.7–7.7)
Neutrophils Relative %: 86 %
Platelets: 251 10*3/uL (ref 150–400)
RBC: 3.87 MIL/uL (ref 3.87–5.11)
RDW: 17.8 % — ABNORMAL HIGH (ref 11.5–15.5)
WBC: 6.2 10*3/uL (ref 4.0–10.5)
nRBC: 0 % (ref 0.0–0.2)

## 2020-08-20 LAB — CMP (CANCER CENTER ONLY)
ALT: 19 U/L (ref 0–44)
AST: 12 U/L — ABNORMAL LOW (ref 15–41)
Albumin: 3.6 g/dL (ref 3.5–5.0)
Alkaline Phosphatase: 51 U/L (ref 38–126)
Anion gap: 11 (ref 5–15)
BUN: 15 mg/dL (ref 6–20)
CO2: 26 mmol/L (ref 22–32)
Calcium: 9.8 mg/dL (ref 8.9–10.3)
Chloride: 104 mmol/L (ref 98–111)
Creatinine: 0.79 mg/dL (ref 0.44–1.00)
GFR, Estimated: >60 mL/min (ref >60)
Glucose, Bld: 110 mg/dL — ABNORMAL HIGH (ref 70–99)
Potassium: 3.9 mmol/L (ref 3.5–5.1)
Sodium: 141 mmol/L (ref 135–145)
Total Bilirubin: 0.4 mg/dL (ref 0.3–1.2)
Total Protein: 6.4 g/dL — ABNORMAL LOW (ref 6.5–8.1)

## 2020-08-20 MED ORDER — SODIUM CHLORIDE 0.9 % IV SOLN
400.0000 mg/m2 | Freq: Once | INTRAVENOUS | Status: AC
  Administered 2020-08-20: 880 mg via INTRAVENOUS
  Filled 2020-08-20: qty 44

## 2020-08-20 MED ORDER — ZOLEDRONIC ACID 4 MG/100ML IV SOLN
4.0000 mg | Freq: Once | INTRAVENOUS | Status: AC
Start: 2020-08-20 — End: 2020-08-20
  Administered 2020-08-20: 4 mg via INTRAVENOUS

## 2020-08-20 MED ORDER — PALONOSETRON HCL INJECTION 0.25 MG/5ML
0.2500 mg | Freq: Once | INTRAVENOUS | Status: AC
  Administered 2020-08-20: 0.25 mg via INTRAVENOUS

## 2020-08-20 MED ORDER — SODIUM CHLORIDE 0.9 % IV SOLN
Freq: Once | INTRAVENOUS | Status: AC
  Filled 2020-08-20: qty 250

## 2020-08-20 MED ORDER — FENTANYL 25 MCG/HR TD PT72: MEDICATED_PATCH | TRANSDERMAL | 0 refills | Status: DC

## 2020-08-20 MED ORDER — SODIUM CHLORIDE 0.9 % IV SOLN
20.0000 mg | Freq: Once | INTRAVENOUS | Status: DC
  Filled 2020-08-20 (×2): qty 2

## 2020-08-20 MED ORDER — ZOLEDRONIC ACID 4 MG/100ML IV SOLN
INTRAVENOUS | Status: AC
  Filled 2020-08-20: qty 100

## 2020-08-20 MED ORDER — OXYCODONE HCL 5 MG PO TABS: ORAL_TABLET | ORAL | 0 refills | Status: DC

## 2020-08-20 MED ORDER — APIXABAN 5 MG PO TABS: 5.0000 mg | ORAL_TABLET | Freq: Two times a day (BID) | ORAL | 2 refills | Status: DC

## 2020-08-20 MED ORDER — PALONOSETRON HCL INJECTION 0.25 MG/5ML
INTRAVENOUS | Status: AC
  Filled 2020-08-20: qty 5

## 2020-08-20 MED ORDER — POTASSIUM CHLORIDE CRYS ER 20 MEQ PO TBCR: 20.0000 meq | EXTENDED_RELEASE_TABLET | Freq: Two times a day (BID) | ORAL | 1 refills | Status: DC

## 2020-08-20 NOTE — Progress Notes (Signed)
Per Dr.Kale no Velcade today, only cytoxan.

## 2020-08-20 NOTE — Patient Instructions (Signed)
[facility]COL[facility]  Discharge Instructions: Thank you for choosing [facility] to provide your oncology and hematology care.   If you have a lab appointment with the [facility], please go directly to the [facility] and check in at the registration area.   Wear comfortable clothing and clothing appropriate for easy access to any Portacath or PICC line.   We strive to give you quality time with your provider. You may need to reschedule your appointment if you arrive late (15 or more minutes).  Arriving late affects you and other patients whose appointments are after yours.  Also, if you miss three or more appointments without notifying the office, you may be dismissed from the clinic at the provider's discretion.      For prescription refill requests, have your pharmacy contact our office and allow 72 hours for refills to be completed.    Today you received the following chemotherapy and/or immunotherapy agents cytoxan      To help prevent nausea and vomiting after your treatment, we encourage you to take your nausea medication as directed.  BELOW ARE SYMPTOMS THAT SHOULD BE REPORTED IMMEDIATELY: *FEVER GREATER THAN 100.4 F (38 C) OR HIGHER *CHILLS OR SWEATING *NAUSEA AND VOMITING THAT IS NOT CONTROLLED WITH YOUR NAUSEA MEDICATION *UNUSUAL SHORTNESS OF BREATH *UNUSUAL BRUISING OR BLEEDING *URINARY PROBLEMS (pain or burning when urinating, or frequent urination) *BOWEL PROBLEMS (unusual diarrhea, constipation, pain near the anus) TENDERNESS IN MOUTH AND THROAT WITH OR WITHOUT PRESENCE OF ULCERS (sore throat, sores in mouth, or a toothache) UNUSUAL RASH, SWELLING OR PAIN  UNUSUAL VAGINAL DISCHARGE OR ITCHING   Items with * indicate a potential emergency and should be followed up as soon as possible or go to the Emergency Department if any problems should occur.  Please show the CHEMOTHERAPY ALERT CARD or IMMUNOTHERAPY ALERT CARD at check-in to the  Emergency Department and triage nurse.  Should you have questions after your visit or need to cancel or reschedule your appointment, please contact [facility]  Dept: [phone]  and follow the prompts.  Office hours are 8:00 a.m. to 4:30 p.m. Monday - Friday. Please note that voicemails left after 4:00 p.m. may not be returned until the following business day.  We are closed weekends and major holidays. You have access to a nurse at all times for urgent questions. Please call the main number to the clinic Dept: [phone] and follow the prompts.   For any non-urgent questions, you may also contact your provider using MyChart. We now offer e-Visits for anyone 44 and older to request care online for non-urgent symptoms. For details visit [email or web address].   Also download the MyChart app! Go to the app store, search "MyChart", open the app, select [facility], and log in with your MyChart username and password.  Due to Covid, a mask is required upon entering the hospital/clinic. If you do not have a mask, one will be given to you upon arrival. For doctor visits, patients may have 1 support person aged 10 or older with them. For treatment visits, patients cannot have anyone with them due to current Covid guidelines and our immunocompromised population.

## 2020-08-21 ENCOUNTER — Telehealth: Payer: Self-pay

## 2020-08-21 ENCOUNTER — Other Ambulatory Visit: Payer: Self-pay | Admitting: Hematology

## 2020-08-21 DIAGNOSIS — R609 Edema, unspecified: Secondary | ICD-10-CM

## 2020-08-21 DIAGNOSIS — C7951 Secondary malignant neoplasm of bone: Secondary | ICD-10-CM

## 2020-08-21 DIAGNOSIS — C9000 Multiple myeloma not having achieved remission: Secondary | ICD-10-CM

## 2020-08-21 LAB — KAPPA/LAMBDA LIGHT CHAINS
Kappa free light chain: 5.8 mg/L (ref 3.3–19.4)
Kappa, lambda light chain ratio: 1.71 — ABNORMAL HIGH (ref 0.26–1.65)
Lambda free light chains: 3.4 mg/L — ABNORMAL LOW (ref 5.7–26.3)

## 2020-08-21 NOTE — Telephone Encounter (Signed)
Notified Patient of prior authorization approval for Oxy IR. Medication is approved for 60 tablets (a 10 days supply) from 08/21/2020 through 08/22/2022

## 2020-08-23 ENCOUNTER — Encounter: Payer: Self-pay | Admitting: Hematology

## 2020-08-23 ENCOUNTER — Telehealth: Payer: Self-pay | Admitting: Hematology

## 2020-08-23 ENCOUNTER — Ambulatory Visit: Payer: BC Managed Care – PPO

## 2020-08-23 NOTE — Telephone Encounter (Signed)
Left message with follow-up appointments per 7/5 los. 

## 2020-08-23 NOTE — Progress Notes (Signed)
DISCONTINUE ON PATHWAY REGIMEN - Multiple Myeloma and Other Plasma Cell Dyscrasias     A cycle is every 21 days:     Dexamethasone      Bortezomib      Cyclophosphamide   **Always confirm dose/schedule in your pharmacy ordering system**  REASON: Toxicities / Adverse Event PRIOR TREATMENT: [id number]: CyBord (Cyclophosphamide 500 mg/m2 IV D1, 8 + Bortezomib 1.3 mg/m2 SUBQ D1, 4, 8, 11 + Dexamethasone 40 mg PO D1, 8, 15) q21 Days x 4 Cycles TREATMENT RESPONSE: Partial Response (PR)  START ON PATHWAY REGIMEN - Multiple Myeloma and Other Plasma Cell Dyscrasias   DaraVRd (Daratumumab IV + Bortezomib SUBQ + Lenalidomide PO + Dexamethasone IV/PO) q21 Days (Induction Schema):   A cycle is every 21 days:     Lenalidomide      Dexamethasone      Bortezomib      Daratumumab   **Always confirm dose/schedule in your pharmacy ordering system**  DaraVRd (Daratumumab IV + Bortezomib SUBQ + Lenalidomide PO + Dexamethasone IV/PO) q21 Days (Consolidation Schema):   A cycle is every 21 days:     Lenalidomide      Dexamethasone      Bortezomib      Daratumumab   **Always confirm dose/schedule in your pharmacy ordering system**  Patient Characteristics: Multiple Myeloma, Newly Diagnosed, Transplant Eligible, High Risk Disease Classification: Multiple Myeloma R-ISS Staging: III Therapeutic Status: Newly Diagnosed Is Patient Eligible for Transplant<= Transplant Eligible Risk Status: High Risk Intent of Therapy: Non-Curative / Palliative Intent, Discussed with Patient

## 2020-08-26 ENCOUNTER — Encounter: Payer: Self-pay | Admitting: Hematology

## 2020-08-26 LAB — MULTIPLE MYELOMA PANEL, SERUM
Albumin SerPl Elph-Mcnc: 3.4 g/dL (ref 2.9–4.4)
Albumin/Glob SerPl: 1.5 (ref 0.7–1.7)
Alpha 1: 0.2 g/dL (ref 0.0–0.4)
Alpha2 Glob SerPl Elph-Mcnc: 0.8 g/dL (ref 0.4–1.0)
B-Globulin SerPl Elph-Mcnc: 0.9 g/dL (ref 0.7–1.3)
Gamma Glob SerPl Elph-Mcnc: 0.4 g/dL (ref 0.4–1.8)
Globulin, Total: 2.3 g/dL (ref 2.2–3.9)
IgA: 23 mg/dL — ABNORMAL LOW (ref 87–352)
IgG (Immunoglobin G), Serum: 387 mg/dL — ABNORMAL LOW (ref 586–1602)
IgM (Immunoglobulin M), Srm: <5 mg/dL — ABNORMAL LOW (ref 26–217)
Total Protein ELP: 5.7 g/dL — ABNORMAL LOW (ref 6.0–8.5)

## 2020-08-27 ENCOUNTER — Other Ambulatory Visit: Payer: Self-pay | Admitting: Neurosurgery

## 2020-08-28 ENCOUNTER — Other Ambulatory Visit: Payer: Self-pay | Admitting: Hematology and Oncology

## 2020-08-28 ENCOUNTER — Other Ambulatory Visit: Payer: Self-pay | Admitting: Hematology

## 2020-08-28 DIAGNOSIS — R609 Edema, unspecified: Secondary | ICD-10-CM

## 2020-08-28 DIAGNOSIS — C9000 Multiple myeloma not having achieved remission: Secondary | ICD-10-CM

## 2020-08-28 DIAGNOSIS — C7951 Secondary malignant neoplasm of bone: Secondary | ICD-10-CM

## 2020-08-29 ENCOUNTER — Other Ambulatory Visit: Payer: Self-pay | Admitting: Neurosurgery

## 2020-08-29 ENCOUNTER — Encounter: Payer: Self-pay | Admitting: Hematology

## 2020-08-30 ENCOUNTER — Inpatient Hospital Stay: Payer: 59

## 2020-08-30 NOTE — Pre-Procedure Instructions (Signed)
Surgical Instructions    Your procedure is scheduled on 09/05/20.  Report to [facility] Main Entrance "A" at 10:30 A.M., then check in with the Admitting office.  Call this number if you have problems the morning of surgery:  [phone]   If you have any questions prior to your surgery date call [phone]: Open Monday-Friday 8am-4pm    Remember:  Do not eat after  or drink midnight the night before your surgery    Take these medicines the morning of surgery with A SIP OF WATER  acyclovir (ZOVIRAX)  amLODipine (NORVASC) gabapentin (NEURONTIN) medroxyPROGESTERone (PROVERA) metoprolol tartrate (LOPRESSOR)  ondansetron (ZOFRAN) oxyCODONE (OXY IR/ROXICODONE)  If needed take: albuterol (VENTOLIN HFA) LORazepam (ATIVAN) prochlorperazine (COMPAZINE)  Please bring all inhalers with you the day of surgery.   Please follow your surgeon's instructions for apixaban (ELIQUIS). If you have not received instructions, please contact your surgeon's office for blood thinner instructions.   As of today, STOP taking any Aspirin (unless otherwise instructed by your surgeon) Aleve, Naproxen, Ibuprofen, Motrin, Advil, Goody's, BC's, all herbal medications, fish oil, and all vitamins.          Do not wear jewelry or makeup Do not wear lotions, powders, perfumes/colognes, or deodorant. Do not shave 48 hours prior to surgery.  Men may shave face and neck. Do not bring valuables to the hospital. DO Not wear nail polish, gel polish, artificial nails, or any other type of covering on  natural nails including finger and toenails. If patients have artificial nails, gel coating, etc. that need to be removed by a nail salon please have this removed prior to surgery or surgery may need to be canceled/delayed if the surgeon/ anesthesia feels like the patient is unable to be adequately monitored.             [facility] is not responsible for any belongings or valuables.  Do NOT Smoke (Tobacco/Vaping) or  drink Alcohol 24 hours prior to your procedure If you use a CPAP at night, you may bring all equipment for your overnight stay.   Contacts, glasses, dentures or bridgework may not be worn into surgery, please bring cases for these belongings   For patients admitted to the hospital, discharge time will be determined by your treatment team.   Patients discharged the day of surgery will not be allowed to drive home, and someone needs to stay with them for 24 hours.  ONLY 1 SUPPORT PERSON MAY BE PRESENT WHILE YOU ARE IN SURGERY. IF YOU ARE TO BE ADMITTED ONCE YOU ARE IN YOUR ROOM YOU WILL BE ALLOWED TWO (2) VISITORS.  Minor children may have two parents present. Special consideration for safety and communication needs will be reviewed on a case by case basis.  Special instructions:    Oral Hygiene is also important to reduce your risk of infection.  Remember - BRUSH YOUR TEETH THE MORNING OF SURGERY WITH YOUR REGULAR TOOTHPASTE   [facility]- Preparing For Surgery  Before surgery, you can play an important role. Because skin is not sterile, your skin needs to be as free of germs as possible. You can reduce the number of germs on your skin by washing with CHG (chlorahexidine gluconate) Soap before surgery.  CHG is an antiseptic cleaner which kills germs and bonds with the skin to continue killing germs even after washing.     Please do not use if you have an allergy to CHG or antibacterial soaps. If your skin becomes reddened/irritated stop using  the CHG.  Do not shave (including legs and underarms) for at least 48 hours prior to first CHG shower. It is OK to shave your face.  Please follow these instructions carefully.     Shower the NIGHT BEFORE SURGERY and the MORNING OF SURGERY with CHG Soap.   If you chose to wash your hair, wash your hair first as usual with your normal shampoo. After you shampoo, rinse your hair and body thoroughly to remove the shampoo.  Then [employer] and genitals  (private parts) with your normal soap and rinse thoroughly to remove soap.  After that Use CHG Soap as you would any other liquid soap. You can apply CHG directly to the skin and wash gently with a scrungie or a clean washcloth.   Apply the CHG Soap to your body ONLY FROM THE NECK DOWN.  Do not use on open wounds or open sores. Avoid contact with your eyes, ears, mouth and genitals (private parts). Wash Face and genitals (private parts)  with your normal soap.   Wash thoroughly, paying special attention to the area where your surgery will be performed.  Thoroughly rinse your body with warm water from the neck down.  DO NOT shower/wash with your normal soap after using and rinsing off the CHG Soap.  Pat yourself dry with a CLEAN TOWEL.  Wear CLEAN PAJAMAS to bed the night before surgery  Place CLEAN SHEETS on your bed the night before your surgery  DO NOT SLEEP WITH PETS.   Day of Surgery:  Take a shower with CHG soap. Wear Clean/Comfortable clothing the morning of surgery Do not apply any deodorants/lotions.   Remember to brush your teeth WITH YOUR REGULAR TOOTHPASTE.   Please read over the following fact sheets that you were given.

## 2020-09-02 ENCOUNTER — Encounter (HOSPITAL_COMMUNITY): Payer: Self-pay

## 2020-09-02 ENCOUNTER — Encounter (HOSPITAL_COMMUNITY)
Admission: RE | Admit: 2020-09-02 | Discharge: 2020-09-02 | Disposition: A | Discharge status: Home / Self Care | Payer: 59 | Source: Ambulatory Visit | Attending: Neurosurgery | Admitting: Neurosurgery

## 2020-09-02 ENCOUNTER — Other Ambulatory Visit: Payer: 59

## 2020-09-02 ENCOUNTER — Other Ambulatory Visit (HOSPITAL_COMMUNITY): Payer: Self-pay

## 2020-09-02 ENCOUNTER — Other Ambulatory Visit: Payer: Self-pay

## 2020-09-02 DIAGNOSIS — Z01812 Encounter for preprocedural laboratory examination: Secondary | ICD-10-CM | POA: Diagnosis not present

## 2020-09-02 DIAGNOSIS — Z20822 Contact with and (suspected) exposure to covid-19: Secondary | ICD-10-CM | POA: Insufficient documentation

## 2020-09-02 HISTORY — DX: Essential (primary) hypertension: I10

## 2020-09-02 LAB — COMPREHENSIVE METABOLIC PANEL
ALT: 19 U/L (ref 0–44)
AST: 15 U/L (ref 15–41)
Albumin: 4 g/dL (ref 3.5–5.0)
Alkaline Phosphatase: 50 U/L (ref 38–126)
Anion gap: 8 (ref 5–15)
BUN: 10 mg/dL (ref 6–20)
CO2: 27 mmol/L (ref 22–32)
Calcium: 9.5 mg/dL (ref 8.9–10.3)
Chloride: 103 mmol/L (ref 98–111)
Creatinine, Ser: 0.75 mg/dL (ref 0.44–1.00)
GFR, Estimated: >60 mL/min (ref >60)
Glucose, Bld: 121 mg/dL — ABNORMAL HIGH (ref 70–99)
Potassium: 3.6 mmol/L (ref 3.5–5.1)
Sodium: 138 mmol/L (ref 135–145)
Total Bilirubin: 0.6 mg/dL (ref 0.3–1.2)
Total Protein: 6.5 g/dL (ref 6.5–8.1)

## 2020-09-02 LAB — CBC
HCT: 41 % (ref 36.0–46.0)
Hemoglobin: 13.2 g/dL (ref 12.0–15.0)
MCH: 31.4 pg (ref 26.0–34.0)
MCHC: 32.2 g/dL (ref 30.0–36.0)
MCV: 97.6 fL (ref 80.0–100.0)
Platelets: 301 10*3/uL (ref 150–400)
RBC: 4.2 MIL/uL (ref 3.87–5.11)
RDW: 17.7 % — ABNORMAL HIGH (ref 11.5–15.5)
WBC: 4.4 10*3/uL (ref 4.0–10.5)
nRBC: 0 % (ref 0.0–0.2)

## 2020-09-02 LAB — SURGICAL PCR SCREEN
MRSA, PCR: NEGATIVE
Staphylococcus aureus: NEGATIVE

## 2020-09-02 LAB — SARS CORONAVIRUS 2 (TAT 6-24 HRS): SARS Coronavirus 2: NEGATIVE

## 2020-09-02 NOTE — Progress Notes (Signed)
PCP - Dr. Allyn Kenner Cardiologist - Denies  Chest x-ray - 04/04/20 EKG - 04/04/20 Stress Test - denies ECHO - 04/06/20 Cardiac Cath - Denies  Sleep Study -  denies  DM - Denies  Blood Thinner Instructions: Eliquis last dose 09/02/20  COVID TEST- 09/02/20   Anesthesia review: No  Patient denies shortness of breath, fever, cough and chest pain at PAT appointment   All instructions explained to the patient, with a verbal understanding of the material. Patient agrees to go over the instructions while at home for a better understanding. Patient also instructed to wear a meask in public after being tested for COVID-19. The opportunity to ask questions was provided.

## 2020-09-04 ENCOUNTER — Other Ambulatory Visit: Payer: Self-pay | Admitting: Hematology

## 2020-09-05 ENCOUNTER — Encounter (HOSPITAL_COMMUNITY): Payer: Self-pay | Admitting: Neurosurgery

## 2020-09-05 ENCOUNTER — Other Ambulatory Visit: Payer: Self-pay

## 2020-09-05 ENCOUNTER — Ambulatory Visit (HOSPITAL_COMMUNITY): Payer: 59

## 2020-09-05 ENCOUNTER — Other Ambulatory Visit: Payer: Self-pay | Admitting: Hematology

## 2020-09-05 ENCOUNTER — Ambulatory Visit (HOSPITAL_COMMUNITY): Payer: 59 | Admitting: Certified Registered Nurse Anesthetist

## 2020-09-05 ENCOUNTER — Encounter (HOSPITAL_COMMUNITY)
Admission: RE | Disposition: A | Discharge status: Home / Self Care | Payer: Self-pay | Source: Home / Self Care | Attending: Neurosurgery

## 2020-09-05 ENCOUNTER — Ambulatory Visit (HOSPITAL_COMMUNITY)
Admission: RE | Admit: 2020-09-05 | Discharge: 2020-09-05 | Disposition: A | Discharge status: Home / Self Care | Payer: 59 | Attending: Neurosurgery | Admitting: Neurosurgery

## 2020-09-05 DIAGNOSIS — M4854XA Collapsed vertebra, not elsewhere classified, thoracic region, initial encounter for fracture: Secondary | ICD-10-CM | POA: Insufficient documentation

## 2020-09-05 DIAGNOSIS — Z7952 Long term (current) use of systemic steroids: Secondary | ICD-10-CM | POA: Diagnosis not present

## 2020-09-05 DIAGNOSIS — Z79899 Other long term (current) drug therapy: Secondary | ICD-10-CM | POA: Diagnosis not present

## 2020-09-05 DIAGNOSIS — Z419 Encounter for procedure for purposes other than remedying health state, unspecified: Secondary | ICD-10-CM

## 2020-09-05 DIAGNOSIS — C7951 Secondary malignant neoplasm of bone: Secondary | ICD-10-CM | POA: Diagnosis not present

## 2020-09-05 DIAGNOSIS — Z881 Allergy status to other antibiotic agents status: Secondary | ICD-10-CM | POA: Insufficient documentation

## 2020-09-05 DIAGNOSIS — M4856XA Collapsed vertebra, not elsewhere classified, lumbar region, initial encounter for fracture: Secondary | ICD-10-CM | POA: Diagnosis present

## 2020-09-05 DIAGNOSIS — Z87891 Personal history of nicotine dependence: Secondary | ICD-10-CM | POA: Diagnosis not present

## 2020-09-05 DIAGNOSIS — Z9221 Personal history of antineoplastic chemotherapy: Secondary | ICD-10-CM | POA: Diagnosis not present

## 2020-09-05 DIAGNOSIS — C9000 Multiple myeloma not having achieved remission: Secondary | ICD-10-CM | POA: Diagnosis not present

## 2020-09-05 DIAGNOSIS — I10 Essential (primary) hypertension: Secondary | ICD-10-CM | POA: Insufficient documentation

## 2020-09-05 DIAGNOSIS — R609 Edema, unspecified: Secondary | ICD-10-CM | POA: Diagnosis not present

## 2020-09-05 DIAGNOSIS — M549 Dorsalgia, unspecified: Secondary | ICD-10-CM | POA: Insufficient documentation

## 2020-09-05 DIAGNOSIS — Z793 Long term (current) use of hormonal contraceptives: Secondary | ICD-10-CM | POA: Insufficient documentation

## 2020-09-05 DIAGNOSIS — Z7901 Long term (current) use of anticoagulants: Secondary | ICD-10-CM | POA: Diagnosis not present

## 2020-09-05 HISTORY — PX: KYPHOPLASTY: SHX5884

## 2020-09-05 LAB — POCT PREGNANCY, URINE: Preg Test, Ur: NEGATIVE

## 2020-09-05 SURGERY — KYPHOPLASTY
Anesthesia: General | Site: Spine Lumbar | Laterality: Bilateral

## 2020-09-05 MED ORDER — GABAPENTIN 300 MG PO CAPS: 300.0000 mg | ORAL_CAPSULE | ORAL | Status: DC

## 2020-09-05 MED ORDER — DEXAMETHASONE SODIUM PHOSPHATE 10 MG/ML IJ SOLN
INTRAMUSCULAR | Status: DC | PRN
  Administered 2020-09-05: 5 mg via INTRAVENOUS

## 2020-09-05 MED ORDER — FUROSEMIDE 20 MG PO TABS: 20.0000 mg | ORAL_TABLET | Freq: Three times a day (TID) | ORAL | Status: DC

## 2020-09-05 MED ORDER — ROCURONIUM BROMIDE 10 MG/ML (PF) SYRINGE
PREFILLED_SYRINGE | INTRAVENOUS | Status: DC | PRN
  Administered 2020-09-05: 50 mg via INTRAVENOUS

## 2020-09-05 MED ORDER — LIDOCAINE 2% (20 MG/ML) 5 ML SYRINGE
INTRAMUSCULAR | Status: DC | PRN
  Administered 2020-09-05: 100 mg via INTRAVENOUS

## 2020-09-05 MED ORDER — CHLORHEXIDINE GLUCONATE CLOTH 2 % EX PADS: 6.0000 | MEDICATED_PAD | Freq: Once | CUTANEOUS | Status: DC

## 2020-09-05 MED ORDER — MIDAZOLAM HCL 2 MG/2ML IJ SOLN
INTRAMUSCULAR | Status: AC
  Filled 2020-09-05: qty 2

## 2020-09-05 MED ORDER — POLYETHYLENE GLYCOL 3350 17 G PO PACK: 17.0000 g | PACK | Freq: Every day | ORAL | Status: AC | PRN

## 2020-09-05 MED ORDER — IOPAMIDOL (ISOVUE-300) INJECTION 61%
INTRAVENOUS | Status: DC | PRN
  Administered 2020-09-05: 100 mL

## 2020-09-05 MED ORDER — FENTANYL 25 MCG/HR TD PT72: 1.0000 | MEDICATED_PATCH | TRANSDERMAL | Status: DC

## 2020-09-05 MED ORDER — PROPOFOL 10 MG/ML IV BOLUS
INTRAVENOUS | Status: DC | PRN
  Administered 2020-09-05: 150 mg via INTRAVENOUS

## 2020-09-05 MED ORDER — 0.9 % SODIUM CHLORIDE (POUR BTL) OPTIME
TOPICAL | Status: DC | PRN
  Administered 2020-09-05: 1000 mL

## 2020-09-05 MED ORDER — SUGAMMADEX SODIUM 200 MG/2ML IV SOLN
INTRAVENOUS | Status: DC | PRN
  Administered 2020-09-05: 200 mg via INTRAVENOUS

## 2020-09-05 MED ORDER — DEXAMETHASONE 4 MG PO TABS: 20.0000 mg | ORAL_TABLET | ORAL | Status: DC

## 2020-09-05 MED ORDER — CHLORHEXIDINE GLUCONATE 0.12 % MT SOLN
15.0000 mL | Freq: Once | OROMUCOSAL | Status: AC
  Administered 2020-09-05: 15 mL via OROMUCOSAL
  Filled 2020-09-05: qty 15

## 2020-09-05 MED ORDER — MIDAZOLAM HCL 2 MG/2ML IJ SOLN
INTRAMUSCULAR | Status: DC | PRN
Start: 2020-09-05 — End: 2020-09-05
  Administered 2020-09-05: 2 mg via INTRAVENOUS
  Administered 2020-09-05: 30 mg via INTRAVENOUS

## 2020-09-05 MED ORDER — CEFAZOLIN SODIUM-DEXTROSE 2-4 GM/100ML-% IV SOLN
2.0000 g | INTRAVENOUS | Status: AC
  Administered 2020-09-05: 2 g via INTRAVENOUS
  Filled 2020-09-05: qty 100

## 2020-09-05 MED ORDER — OXYCODONE HCL 5 MG PO TABS: 5.0000 mg | ORAL_TABLET | Freq: Once | ORAL | Status: DC

## 2020-09-05 MED ORDER — DEXMEDETOMIDINE (PRECEDEX) IN NS 20 MCG/5ML (4 MCG/ML) IV SYRINGE
PREFILLED_SYRINGE | INTRAVENOUS | Status: DC | PRN
  Administered 2020-09-05: 12 ug via INTRAVENOUS
  Administered 2020-09-05: 8 ug via INTRAVENOUS

## 2020-09-05 MED ORDER — ORAL CARE MOUTH RINSE: 15.0000 mL | Freq: Once | OROMUCOSAL | Status: AC

## 2020-09-05 MED ORDER — ONDANSETRON HCL 4 MG/2ML IJ SOLN
INTRAMUSCULAR | Status: DC | PRN
  Administered 2020-09-05: 4 mg via INTRAVENOUS

## 2020-09-05 MED ORDER — APIXABAN 5 MG PO TABS: 5.0000 mg | ORAL_TABLET | Freq: Two times a day (BID) | ORAL | 2 refills | Status: DC

## 2020-09-05 MED ORDER — BUPIVACAINE HCL (PF) 0.5 % IJ SOLN
INTRAMUSCULAR | Status: AC
  Filled 2020-09-05: qty 30

## 2020-09-05 MED ORDER — LIDOCAINE-EPINEPHRINE 1 %-1:100000 IJ SOLN
INTRAMUSCULAR | Status: AC
  Filled 2020-09-05: qty 1

## 2020-09-05 MED ORDER — OXYCODONE HCL 5 MG PO TABS
ORAL_TABLET | ORAL | Status: AC
  Administered 2020-09-05: 5 mg
  Filled 2020-09-05: qty 1

## 2020-09-05 MED ORDER — PROPOFOL 10 MG/ML IV BOLUS
INTRAVENOUS | Status: AC
  Filled 2020-09-05: qty 20

## 2020-09-05 MED ORDER — MAGIC MOUTHWASH W/LIDOCAINE: 5.0000 mL | Freq: Three times a day (TID) | ORAL | Status: DC | PRN

## 2020-09-05 MED ORDER — FENTANYL CITRATE (PF) 250 MCG/5ML IJ SOLN
INTRAMUSCULAR | Status: DC | PRN
  Administered 2020-09-05: 50 ug via INTRAVENOUS
  Administered 2020-09-05: 150 ug via INTRAVENOUS
  Administered 2020-09-05: 50 ug via INTRAVENOUS

## 2020-09-05 MED ORDER — BUPIVACAINE HCL (PF) 0.5 % IJ SOLN
INTRAMUSCULAR | Status: DC | PRN
  Administered 2020-09-05: 5 mL

## 2020-09-05 MED ORDER — LIDOCAINE-EPINEPHRINE 1 %-1:100000 IJ SOLN
INTRAMUSCULAR | Status: DC | PRN
  Administered 2020-09-05: 5 mL

## 2020-09-05 MED ORDER — LACTATED RINGERS IV SOLN: INTRAVENOUS | Status: DC

## 2020-09-05 MED ORDER — FENTANYL CITRATE (PF) 250 MCG/5ML IJ SOLN
INTRAMUSCULAR | Status: AC
  Filled 2020-09-05: qty 5

## 2020-09-05 SURGICAL SUPPLY — 33 items
BAG COUNTER SPONGE SURGICOUNT (BAG) ×2 IMPLANT
BLADE SURG 15 STRL LF DISP TIS (BLADE) ×1 IMPLANT
BLADE SURG 15 STRL SS (BLADE) ×2
CEMENT KYPHON C01A KIT/MIXER (Cement) ×2 IMPLANT
DERMABOND ADHESIVE PROPEN (GAUZE/BANDAGES/DRESSINGS) ×1
DERMABOND ADVANCED (GAUZE/BANDAGES/DRESSINGS) ×1
DERMABOND ADVANCED .7 DNX12 (GAUZE/BANDAGES/DRESSINGS) ×1 IMPLANT
DERMABOND ADVANCED .7 DNX6 (GAUZE/BANDAGES/DRESSINGS) ×1 IMPLANT
DRAPE C-ARM 42X72 X-RAY (DRAPES) ×4 IMPLANT
DRAPE HALF SHEET 40X57 (DRAPES) ×2 IMPLANT
DRAPE INCISE IOBAN 66X45 STRL (DRAPES) ×2 IMPLANT
DRAPE LAPAROTOMY 100X72X124 (DRAPES) ×2 IMPLANT
DRAPE WARM FLUID 44X44 (DRAPES) ×2 IMPLANT
DURAPREP 26ML APPLICATOR (WOUND CARE) ×2 IMPLANT
GAUZE 4X4 16PLY ~~LOC~~+RFID DBL (SPONGE) ×2 IMPLANT
GLOVE SURG ENC MOIS LTX SZ7.5 (GLOVE) ×4 IMPLANT
GLOVE SURG LTX SZ7 (GLOVE) ×4 IMPLANT
GLOVE SURG UNDER POLY LF SZ7.5 (GLOVE) ×4 IMPLANT
GOWN STRL REUS W/ TWL LRG LVL3 (GOWN DISPOSABLE) ×3 IMPLANT
GOWN STRL REUS W/ TWL XL LVL3 (GOWN DISPOSABLE) ×1 IMPLANT
GOWN STRL REUS W/TWL 2XL LVL3 (GOWN DISPOSABLE) IMPLANT
GOWN STRL REUS W/TWL LRG LVL3 (GOWN DISPOSABLE) ×3
GOWN STRL REUS W/TWL XL LVL3 (GOWN DISPOSABLE) ×1
KIT BASIN OR (CUSTOM PROCEDURE TRAY) ×2 IMPLANT
KIT TURNOVER KIT B (KITS) ×2 IMPLANT
NEEDLE HYPO 25X1 1.5 SAFETY (NEEDLE) ×2 IMPLANT
NS IRRIG 1000ML POUR BTL (IV SOLUTION) ×2 IMPLANT
PACK SURGICAL SETUP 50X90 (CUSTOM PROCEDURE TRAY) ×2 IMPLANT
SUT VICRYL 3-0 RB1 18 ABS (SUTURE) ×2 IMPLANT
SYR CONTROL 10ML LL (SYRINGE) ×4 IMPLANT
TOWEL GREEN STERILE (TOWEL DISPOSABLE) ×2 IMPLANT
TOWEL GREEN STERILE FF (TOWEL DISPOSABLE) ×2 IMPLANT
TRAY KYPHOPAK 20/3 EXPRESS 1ST (MISCELLANEOUS) ×4 IMPLANT

## 2020-09-05 NOTE — Discharge Summary (Signed)
Physician Discharge Summary  Patient ID: Stacey Montoya MRN: [id number] DOB/AGE: [birth year] 45 y.o.  Admit date: 09/05/2020 Discharge date: 09/05/2020  Admission Diagnoses:  T12 compression fracture L4 Burst fracture  Discharge Diagnoses:  Same Active Problems:   * No active hospital problems. *   Discharged Condition: Stable  Hospital Course:  TRINISHA PAGET is a 45 y.o. female initially seen in the outpatient neurosurgery clinic with low back pain and recent diagnosis of multiple myeloma.  Imaging revealed compression fractures at T12 and stable burst fracture at L4.  Patient was brought in for elective kyphoplasty procedure.  She was at baseline postoperatively and was discharged home in stable condition.  Treatments: Surgery -T12, L4 kyphoplasty  Discharge Exam: Blood pressure 130/83, pulse 86, temperature 98 F (36.7 C), temperature source Oral, resp. rate 18, SpO2 96 %. Awake, alert, oriented Speech fluent, appropriate CN grossly intact 5/5 BUE/BLE Wound c/d/i  Disposition: Discharge disposition: 01-Home or Self Care       Discharge Instructions     Call MD for:  redness, tenderness, or signs of infection (pain, swelling, redness, odor or green/yellow discharge around incision site)   Complete by: As directed    Call MD for:  temperature >100.4   Complete by: As directed    Diet - low sodium heart healthy   Complete by: As directed    Discharge instructions   Complete by: As directed    Walk at home as much as possible, at least 4 times / day   Increase activity slowly   Complete by: As directed    Lifting restrictions   Complete by: As directed    No lifting > 10 lbs   May shower / Bathe   Complete by: As directed    48 hours after surgery   May walk up steps   Complete by: As directed    No dressing needed   Complete by: As directed    Other Restrictions   Complete by: As directed    No bending/twisting at waist      Allergies as of  09/05/2020       Reactions   Zithromax [azithromycin] Rash        Medication List     TAKE these medications    acyclovir 400 MG tablet Commonly known as: ZOVIRAX TAKE (1) TABLET BY MOUTH TWICE DAILY.   albuterol 108 (90 Base) MCG/ACT inhaler Commonly known as: VENTOLIN HFA Inhale 1-2 puffs into the lungs every 6 (six) hours as needed for wheezing.   amLODipine 5 MG tablet Commonly known as: NORVASC Take 1 tablet (5 mg total) by mouth daily.   apixaban 5 MG Tabs tablet Commonly known as: ELIQUIS Take 1 tablet (5 mg total) by mouth 2 (two) times daily. Start taking on: September 08, 2020 What changed: These instructions start on September 08, 2020. If you are unsure what to do until then, ask your doctor or other care provider.   b complex vitamins capsule Take 1 capsule by mouth daily.   dexamethasone 4 MG tablet Commonly known as: DECADRON Take 5 tablets (20 mg total) by mouth See admin instructions. Take 20 mg on day 15 of each chemo cycle What changed: See the new instructions.   ergocalciferol 1.25 MG (50000 UT) capsule Commonly known as: VITAMIN D2 Take 1 capsule (50,000 Units total) by mouth 2 (two) times a week.   fentaNYL 25 MCG/HR Commonly known as: [place] 1 patch onto the skin every 3 (three)  days.   furosemide 20 MG tablet Commonly known as: LASIX Take 1 tablet (20 mg total) by mouth 3 (three) times daily.   gabapentin 300 MG capsule Commonly known as: NEURONTIN Take 1-3 capsules (300-900 mg total) by mouth See admin instructions. take 300mg in the morning and 900mg  at bedtime   LORazepam 0.5 MG tablet Commonly known as: ATIVAN TAKE (1) TABLET BY MOUTH EVERY SIX HOURS AS NEEDED.   magic mouthwash w/lidocaine Soln Take 5 mLs by mouth 3 (three) times daily as needed for mouth pain. Dispense 400ml of magic mouthwash compounded preparation. Patient to use 5ml 4 times daily as needed for throat discomfort. 80 ml viscous lidocaine 2% 80 ml Mylanta 80  ml diphenhydramine at 12.5 mg per 5 ml elixir 80 ml nystatin at 100,000U per 5 mL suspension 80 ml distilled water   medroxyPROGESTERone 10 MG tablet Commonly known as: PROVERA Take 10 mg by mouth daily.   metoprolol tartrate 50 MG tablet Commonly known as: LOPRESSOR Take 1 tablet (50 mg total) by mouth 2 (two) times daily.   nicotine 21 mg/24hr patch Commonly known as: NICODERM CQ - dosed in mg/24 hours Place 1 patch (21 mg total) onto the skin daily.   ondansetron 4 MG tablet Commonly known as: ZOFRAN Take 8 mg by mouth every 8 (eight) hours.   oxyCODONE 5 MG immediate release tablet Commonly known as: Oxy IR/ROXICODONE TAKE 1 OR 2 TABLETS BY MOUTH EVERY 6 HOURS AS NEEDED   polyethylene glycol 17 g packet Commonly known as: MIRALAX / GLYCOLAX Take 17 g by mouth daily as needed for mild constipation.   potassium chloride SA 20 MEQ tablet Commonly known as: KLOR-CON Take 1 tablet (20 mEq total) by mouth 2 (two) times daily.   prochlorperazine 10 MG tablet Commonly known as: COMPAZINE Take 10 mg by mouth every 6 (six) hours as needed for nausea or vomiting.               Discharge Care Instructions  (From admission, onward)           Start     Ordered   09/05/20 0000  No dressing needed        09/05/20 1451            Follow-up Information     Nundkumar, Neelesh, MD Follow up in 2 week(s).   Specialty: Neurosurgery Why: Clinical Follow-up Contact information: [address] [place] [address] [phone]                 Signed: Neelesh C Nundkumar 09/05/2020, 2:51 PM   

## 2020-09-05 NOTE — Anesthesia Preprocedure Evaluation (Addendum)
Anesthesia Evaluation  Patient identified by MRN, date of birth, ID band Patient awake    Reviewed: Allergy & Precautions, H&P , NPO status , Patient's Chart, lab work & pertinent test results, reviewed documented beta blocker date and time   Airway Mallampati: III  TM Distance: >3 FB Neck ROM: Full    Dental no notable dental hx. (+) Teeth Intact, Dental Advisory Given   Pulmonary neg pulmonary ROS, former smoker,    Pulmonary exam normal breath sounds clear to auscultation       Cardiovascular hypertension, Pt. on medications and Pt. on home beta blockers  Rhythm:Regular Rate:Normal     Neuro/Psych negative neurological ROS  negative psych ROS   GI/Hepatic negative GI ROS, Neg liver ROS,   Endo/Other  Morbid obesity  Renal/GU negative Renal ROS  negative genitourinary   Musculoskeletal   Abdominal   Peds  Hematology  (+) Blood dyscrasia, anemia , Multiple Myeloma   Anesthesia Other Findings   Reproductive/Obstetrics negative OB ROS                            Anesthesia Physical Anesthesia Plan  ASA: 3  Anesthesia Plan: General   Post-op Pain Management:    Induction: Intravenous  PONV Risk Score and Plan: 4 or greater and Ondansetron, Dexamethasone and Midazolam  Airway Management Planned: Oral ETT  Additional Equipment:   Intra-op Plan:   Post-operative Plan: Extubation in OR  Informed Consent: I have reviewed the patients History and Physical, chart, labs and discussed the procedure including the risks, benefits and alternatives for the proposed anesthesia with the patient or authorized representative who has indicated his/her understanding and acceptance.     Dental advisory given  Plan Discussed with: CRNA  Anesthesia Plan Comments:         Anesthesia Quick Evaluation

## 2020-09-05 NOTE — Anesthesia Procedure Notes (Signed)
Procedure Name: Intubation Date/Time: 09/05/2020 1:38 PM Performed by: Janace Litten, CRNA Pre-anesthesia Checklist: Patient identified, Emergency Drugs available, Suction available and Patient being monitored Patient Re-evaluated:Patient Re-evaluated prior to induction Oxygen Delivery Method: Circle System Utilized Preoxygenation: Pre-oxygenation with 100% oxygen Induction Type: IV induction Ventilation: Mask ventilation without difficulty and Oral airway inserted - appropriate to patient size Laryngoscope Size: Mac and 3 Grade View: Grade I Tube type: Oral Tube size: 7.0 mm Number of attempts: 1 Airway Equipment and Method: Stylet and Oral airway Placement Confirmation: ETT inserted through vocal cords under direct vision, positive ETCO2 and breath sounds checked- equal and bilateral Secured at: 22 cm Tube secured with: Tape Dental Injury: Teeth and Oropharynx as per pre-operative assessment

## 2020-09-05 NOTE — Op Note (Signed)
  NEUROSURGERY OPERATIVE NOTE   PREOP DIAGNOSIS: T12, L4 compression fx   POSTOP DIAGNOSIS: Same  PROCEDURE: 1. T12 Kyphoplasty 2. L4 vertebroplasty  SURGEON: Dr. Consuella Lose, MD  ASSISTANT: None  ANESTHESIA: GETA  EBL: Minimal  SPECIMENS: None  DRAINS: None  COMPLICATIONS: None immediate  CONDITION: Hemodynamically stable to PCAU  HISTORY: Stacey Montoya is a 45 y.o. yo female initially seen in the outpatient neurosurgery clinic with multiple myeloma and T12 and L4 compression fractures.  She did not have significant improvement with reasonable conservative treatment and elected to proceed with vertebral augmentation.  The risks, benefits, and alternatives to the procedure were reviewed in detail with the patient.  After all her questions were answered informed consent was obtained and witnessed.  PROCEDURE IN DETAIL: After informed consent was obtained and witnessed, the patient was brought to the operating room. After induction of anesthesia, the patient was positioned on the operative table in the prone position. All pressure points were meticulously padded. Fluoroscopy was used to mark out the projection of the T12 and L4 pedicles on the skin. Skin incision was then marked out and prepped and draped in the usual sterile fashion.  After time out was conducted, unilateral left-sided stab incisions were made and Jamshidi needles were introduced into the pedicle of T12 and L4. Drill was then used to create a channel and the kyphoplasty balloon was placed and expanded to create a cavity at the T12 level.  Attempt was made to insert the balloon at the L4 level however due to the significant collapse, I was unable to insert the balloon far enough.  We therefore elected to proceed with vertebroplasty at L4. Approximately 4.5 cc of PMMA cement was injected into the T12 vertebral body, taking care to preserve the posterior cortex.  This was done under AP and lateral fluoroscopic  guidance.  Similarly, approximately 2 cc of PMMA cement was injected into the L4 vertebral body again taking care to preserve the posterior cortex.  This was again done under AP and lateral fluoroscopy.  Jamshidi needles were then removed and final AP and lateral fluoroscopic images were taken.  Stab incisions were then closed with 3-0 vicryl suture and standard skin glue. The patient was then transferred to the stretcher and taken to the PACU in stable hemodynamic condition.  At the end of the case all sponge, needle, and instrument counts were correct.   Consuella Lose, MD [facility]

## 2020-09-05 NOTE — Transfer of Care (Signed)
Immediate Anesthesia Transfer of Care Note  Patient: Stacey Montoya  Procedure(s) Performed: KYPHOPLASTY THORACIC TWELVE AND LUMBAR FOUR (Bilateral: Spine Lumbar)  Patient Location: PACU  Anesthesia Type:General  Level of Consciousness: drowsy, patient cooperative and responds to stimulation  Airway & Oxygen Therapy: Patient Spontanous Breathing  Post-op Assessment: Report given to RN and Post -op Vital signs reviewed and stable  Post vital signs: Reviewed and stable  Last Vitals:  Vitals Value Taken Time  BP 118/62 09/05/20 1500  Temp    Pulse 92 09/05/20 1502  Resp 9 09/05/20 1502  SpO2 95 % 09/05/20 1502  Vitals shown include unvalidated device data.  Last Pain:  Vitals:   09/05/20 1118  TempSrc:   PainSc: 3       Patients Stated Pain Goal: 3 (37/16/96 7893)  Complications: No notable events documented.

## 2020-09-05 NOTE — H&P (Signed)
Chief Complaint   Back pain   History of Present Illness  Stacey Montoya is a 45 y.o. female with a history of multiple myeloma, currently under treatment.  She initially presented with back pain and was found to have T12 and L4 compression fractures.  She attempted conservative treatment without any improvement and elected to proceed with vertebral augmentation.  Past Medical History   Past Medical History:  Diagnosis Date   Abnormal Pap smear of cervix    Back pain    Cancer ([place]) 04/02/2020   multiple myeloma   Hyperlipidemia    Hypertension     Past Surgical History   Past Surgical History:  Procedure Laterality Date   CERVICAL CONIZATION W/BX  12/02/2010   Procedure: CONIZATION CERVIX WITH BIOPSY;  Surgeon: Arloa Koh;  Location: [facility];  Service: Gynecology;  Laterality: N/A;  COLD KNIFE   CESAREAN SECTION     DILATION AND CURETTAGE OF UTERUS  12/02/2010   Procedure: DILATATION AND CURETTAGE (D&C);  Surgeon: Arloa Koh;  Location: [facility];  Service: Gynecology;  Laterality: N/A;   IR RADIOLOGIST EVAL & MGMT  04/16/2020   left hand     drain - infection   WISDOM TOOTH EXTRACTION      Social History   Social History   Tobacco Use   Smoking status: Former    Packs/day: 0.50    Years: 20.00    Pack years: 10.00    Types: Cigarettes   Smokeless tobacco: Never  Vaping Use   Vaping Use: Some days  Substance Use Topics   Alcohol use: Not Currently   Drug use: Not Currently    Medications   Prior to Admission medications   Medication Sig Start Date End Date Taking? Authorizing Provider  acyclovir (ZOVIRAX) 400 MG tablet TAKE (1) TABLET BY MOUTH TWICE DAILY. 09/04/20  Yes Brunetta Genera, MD  amLODipine (NORVASC) 5 MG tablet Take 1 tablet (5 mg total) by mouth daily. 07/10/20 09/05/20 Yes Brunetta Genera, MD  apixaban (ELIQUIS) 5 MG TABS tablet Take 1 tablet (5 mg total) by mouth 2 (two) times daily. 08/20/20  Yes Brunetta Genera, MD  b  complex vitamins capsule Take 1 capsule by mouth daily. 06/10/20  Yes Brunetta Genera, MD  dexamethasone (DECADRON) 4 MG tablet TAKE 10 TABLETS ON DAY 15 OF EACH CYCLE. REPEAT EVERY 21 DAYS. TAKE WITH BREAKFAST. Patient taking differently: Take 20 mg by mouth See admin instructions. Take 20 mg on day 15 of each chemo cycle 08/21/20  Yes Brunetta Genera, MD  ergocalciferol (VITAMIN D2) 1.25 MG (50000 UT) capsule Take 1 capsule (50,000 Units total) by mouth 2 (two) times a week. 06/10/20  Yes Brunetta Genera, MD  fentaNYL (DURAGESIC) 25 MCG/HR PLACE 1 PATCH ONTO SKIN EVERY 3 DAYS. Patient taking differently: Place 1 patch onto the skin every 3 (three) days. 08/20/20  Yes Brunetta Genera, MD  furosemide (LASIX) 20 MG tablet Take 3 tablets (60 mg total) by mouth daily. Patient taking differently: Take 20 mg by mouth 3 (three) times daily. 08/13/20  Yes Truitt Merle, MD  gabapentin (NEURONTIN) 300 MG capsule Plz take 336m (1 cap)PO in the morning and 9031m(3 caps) po at bedtime Patient taking differently: Take 300-900 mg by mouth See admin instructions. take 30018mn the morning and 900m74mt bedtime 08/13/20  Yes FengTruitt Merle  LORazepam (ATIVAN) 0.5 MG tablet TAKE (1) TABLET BY MOUTH EVERY SIX HOURS AS  NEEDED. 08/29/20  Yes Brunetta Genera, MD  medroxyPROGESTERone (PROVERA) 10 MG tablet Take 10 mg by mouth daily.   Yes [provider]  metoprolol tartrate (LOPRESSOR) 50 MG tablet Take 1 tablet (50 mg total) by mouth 2 (two) times daily. 07/10/20 09/05/20 Yes Brunetta Genera, MD  ondansetron (ZOFRAN) 4 MG tablet Take 8 mg by mouth every 8 (eight) hours. 03/19/20  Yes [provider]  oxyCODONE (OXY IR/ROXICODONE) 5 MG immediate release tablet TAKE 1 OR 2 TABLETS BY MOUTH EVERY 6 HOURS AS NEEDED 08/29/20  Yes Brunetta Genera, MD  potassium chloride SA (KLOR-CON) 20 MEQ tablet Take 1 tablet (20 mEq total) by mouth 2 (two) times daily. 08/20/20  Yes Brunetta Genera,  MD  prochlorperazine (COMPAZINE) 10 MG tablet Take 10 mg by mouth every 6 (six) hours as needed for nausea or vomiting.   Yes [provider]  albuterol (VENTOLIN HFA) 108 (90 Base) MCG/ACT inhaler Inhale 1-2 puffs into the lungs every 6 (six) hours as needed for wheezing. 02/01/20   [provider]  magic mouthwash w/lidocaine SOLN Dispense 417m of magic mouthwash compounded preparation. Patient to use 552m4 times daily as needed for throat discomfort. 80 ml viscous lidocaine 2% 80 ml Mylanta 80 ml diphenhydramine at 12.5 mg per 5 ml elixir 80 ml nystatin at 100,000U per 5 mL suspension 80 ml distilled water Patient taking differently: Take 5 mLs by mouth 3 (three) times daily as needed for mouth pain. Dispense 40060mf magic mouthwash compounded preparation. Patient to use 5ml40mtimes daily as needed for throat discomfort. 80 ml viscous lidocaine 2% 80 ml Mylanta 80 ml diphenhydramine at 12.5 mg per 5 ml elixir 80 ml nystatin at 100,000U per 5 mL suspension 80 ml distilled water 04/29/20   KaleBrunetta Genera  nicotine (NICODERM CQ - DOSED IN MG/24 HOURS) 21 mg/24hr patch Place 1 patch (21 mg total) onto the skin daily. 04/10/20   HallKayleen Memos  polyethylene glycol (MIRALAX / GLYCOLAX) 17 g packet Take 17 g by mouth daily. Patient taking differently: Take 17 g by mouth daily as needed for mild constipation. 04/10/20   HallKayleen Memos    Allergies   Allergies  Allergen Reactions   Zithromax [Azithromycin] Rash    Review of Systems  ROS  Neurologic Exam  Awake, alert, oriented Memory and concentration grossly intact Speech fluent, appropriate CN grossly intact Motor exam: Upper Extremities Deltoid Bicep Tricep Grip  Right 5/5 5/5 5/5 5/5  Left 5/5 5/5 5/5 5/5   Lower Extremities IP Quad PF DF EHL  Right 5/5 5/5 5/5 5/5 5/5  Left 5/5 5/5 5/5 5/5 5/5   Sensation grossly intact to LT  Imaging  MRI of the lumbar spine dated 07/04/2020 was again  personally reviewed and demonstrates significant pathologic compression fractures at both T12 and L4.  There is bony retropulsion at L4 with associated stenosis.  Impression  - 44 y59. female with multiple myeloma and associated pathologic compression fractures at T12 and L4.  While she does have some anterior hip pain, she does not have any clinical radiculopathy or claudication type symptoms.  I therefore do not think that decompression is required.  Plan  -We will plan on proceeding with kyphoplasty at T12 and L4.  I have reviewed the indications for the procedure as well as the alternatives at length in the office.  We have discussed the risks of the procedure and the expected  postoperative course and recovery.  All her questions today were answered.  She provided informed consent to proceed.  Consuella Lose, MD [facility]

## 2020-09-05 NOTE — Anesthesia Postprocedure Evaluation (Signed)
Anesthesia Post Note  Patient: Stacey Montoya  Procedure(s) Performed: KYPHOPLASTY THORACIC TWELVE AND LUMBAR FOUR (Bilateral: Spine Lumbar)     Patient location during evaluation: PACU Anesthesia Type: General Level of consciousness: awake and alert Pain management: pain level controlled Vital Signs Assessment: post-procedure vital signs reviewed and stable Respiratory status: spontaneous breathing, nonlabored ventilation, respiratory function stable and patient connected to nasal cannula oxygen Cardiovascular status: blood pressure returned to baseline and stable Postop Assessment: no apparent nausea or vomiting Anesthetic complications: no   No notable events documented.  Last Vitals:  Vitals:   09/05/20 1046 09/05/20 1500  BP: 130/83 118/62  Pulse: 86 91  Resp: 18 10  Temp: 36.7 C 36.5 C  SpO2: 96% 92%    Last Pain:  Vitals:   09/05/20 1500  TempSrc:   PainSc: Asleep                 Jayd Cadieux,W. EDMOND

## 2020-09-06 ENCOUNTER — Ambulatory Visit: Payer: 59

## 2020-09-06 ENCOUNTER — Other Ambulatory Visit: Payer: 59

## 2020-09-06 ENCOUNTER — Other Ambulatory Visit: Payer: Self-pay | Admitting: Hematology

## 2020-09-06 ENCOUNTER — Ambulatory Visit: Payer: 59 | Admitting: Hematology

## 2020-09-06 ENCOUNTER — Encounter (HOSPITAL_COMMUNITY): Payer: Self-pay | Admitting: Neurosurgery

## 2020-09-06 DIAGNOSIS — C9000 Multiple myeloma not having achieved remission: Secondary | ICD-10-CM

## 2020-09-06 DIAGNOSIS — C7951 Secondary malignant neoplasm of bone: Secondary | ICD-10-CM

## 2020-09-06 DIAGNOSIS — R609 Edema, unspecified: Secondary | ICD-10-CM

## 2020-09-09 ENCOUNTER — Other Ambulatory Visit: Payer: Self-pay

## 2020-09-09 ENCOUNTER — Other Ambulatory Visit: Payer: Self-pay | Admitting: Hematology

## 2020-09-09 ENCOUNTER — Ambulatory Visit: Payer: 59

## 2020-09-09 ENCOUNTER — Other Ambulatory Visit: Payer: 59

## 2020-09-09 ENCOUNTER — Ambulatory Visit: Payer: 59 | Admitting: Hematology

## 2020-09-09 NOTE — Progress Notes (Signed)
HEMATOLOGY/ONCOLOGY CLINIC NOTE  Date of Service: 08/20/2020  Patient Care Team: Celene Squibb, MD as PCP - General (Internal Medicine) Corrie Mckusick, DO as Consulting Physician (Interventional Radiology)  CHIEF COMPLAINTS/PURPOSE OF CONSULTATION:  Multiple Myeloma- continued mx  HISTORY OF PRESENTING ILLNESS:  Please see previous note for HPI.  INTERVAL HISTORY:  Stacey Montoya is a wonderful 45 y.o. female who is here today for evaluation and management of high risk Myeloma. The patient recently had T12 and L4 kyphoplasty on 7/21 with some improvement in her pain. She is still needing significant pain medications to address back pain and radicular pain. We discuss and increased her gabapentin and fentanyl patch so she will not need to take as frequent oxycodone for breakthorugh pain.  Patient is proceeding with consideration of Auto HSCT soon as so as per discussion with Dr Aris Lot we shall continue only Cytoxan and dexamethasone and hold off on starting Daratumumab at this time.  Lab results today reviewed.  On review of systems, pt reports no acute new concerns.   MEDICAL HISTORY:  Past Medical History:  Diagnosis Date   Abnormal Pap smear of cervix    Back pain    Cancer ([place]) 04/02/2020   multiple myeloma   Hyperlipidemia    Hypertension     SURGICAL HISTORY: Past Surgical History:  Procedure Laterality Date   CERVICAL CONIZATION W/BX  12/02/2010   Procedure: CONIZATION CERVIX WITH BIOPSY;  Surgeon: Arloa Koh;  Location: [facility];  Service: Gynecology;  Laterality: N/A;  COLD KNIFE   CESAREAN SECTION     DILATION AND CURETTAGE OF UTERUS  12/02/2010   Procedure: DILATATION AND CURETTAGE (D&C);  Surgeon: Arloa Koh;  Location: [facility];  Service: Gynecology;  Laterality: N/A;   IR RADIOLOGIST EVAL & MGMT  04/16/2020   KYPHOPLASTY Bilateral 09/05/2020   Procedure: KYPHOPLASTY THORACIC TWELVE AND LUMBAR FOUR;  Surgeon: Consuella Lose, MD;  Location: [facility];   Service: Neurosurgery;  Laterality: Bilateral;   left hand     drain - infection   WISDOM TOOTH EXTRACTION      SOCIAL HISTORY: Social History   Socioeconomic History   Marital status: Married    Spouse name: Not on file   Number of children: Not on file   Years of education: Not on file   Highest education level: Not on file  Occupational History   Not on file  Tobacco Use   Smoking status: Former    Packs/day: 0.50    Years: 20.00    Pack years: 10.00    Types: Cigarettes   Smokeless tobacco: Never  Vaping Use   Vaping Use: Some days  Substance and Sexual Activity   Alcohol use: Not Currently   Drug use: Not Currently   Sexual activity: Not Currently    Partners: Male    Birth control/protection: Condom  Other Topics Concern   Not on file  Social History Narrative   ** Merged History Encounter **       Social Determinants of Health   Financial Resource Strain: Not on file  Food Insecurity: Not on file  Transportation Needs: Not on file  Physical Activity: Not on file  Stress: Not on file  Social Connections: Not on file  Intimate Partner Violence: Not on file    FAMILY HISTORY: Family History  Problem Relation Age of Onset   Lung cancer Maternal Grandfather    Pancreatic cancer Paternal Grandfather     ALLERGIES:  is  allergic to zithromax [azithromycin].  MEDICATIONS:  Current Outpatient Medications  Medication Sig Dispense Refill   acyclovir (ZOVIRAX) 400 MG tablet TAKE (1) TABLET BY MOUTH TWICE DAILY. 60 tablet 0   albuterol (VENTOLIN HFA) 108 (90 Base) MCG/ACT inhaler Inhale 1-2 puffs into the lungs every 6 (six) hours as needed for wheezing.     amLODipine (NORVASC) 5 MG tablet Take 1 tablet (5 mg total) by mouth daily. 30 tablet 0   apixaban (ELIQUIS) 5 MG TABS tablet Take 1 tablet (5 mg total) by mouth 2 (two) times daily. 60 tablet 2   b complex vitamins capsule Take 1 capsule by mouth daily. 30 capsule    dexamethasone (DECADRON) 4 MG  tablet Take 5 tablets (20 mg total) by mouth See admin instructions. Take 20 mg on day 15 of each chemo cycle     ergocalciferol (VITAMIN D2) 1.25 MG (50000 UT) capsule Take 1 capsule (50,000 Units total) by mouth 2 (two) times a week. 12 capsule 2   fentaNYL (DURAGESIC) 25 MCG/HR Place 1 patch onto the skin every 3 (three) days.     furosemide (LASIX) 20 MG tablet Take 1 tablet (20 mg total) by mouth 3 (three) times daily.     gabapentin (NEURONTIN) 300 MG capsule Take 1-3 capsules (300-900 mg total) by mouth See admin instructions. take 349m in the morning and 9017m at bedtime     LORazepam (ATIVAN) 0.5 MG tablet TAKE (1) TABLET BY MOUTH EVERY SIX HOURS AS NEEDED. 30 tablet 0   magic mouthwash w/lidocaine SOLN Take 5 mLs by mouth 3 (three) times daily as needed for mouth pain. Dispense 40060mf magic mouthwash compounded preparation. Patient to use 5ml36mtimes daily as needed for throat discomfort. 80 ml viscous lidocaine 2% 80 ml Mylanta 80 ml diphenhydramine at 12.5 mg per 5 ml elixir 80 ml nystatin at 100,000U per 5 mL suspension 80 ml distilled water     medroxyPROGESTERone (PROVERA) 10 MG tablet Take 10 mg by mouth daily.     metoprolol tartrate (LOPRESSOR) 50 MG tablet Take 1 tablet (50 mg total) by mouth 2 (two) times daily. 60 tablet 0   nicotine (NICODERM CQ - DOSED IN MG/24 HOURS) 21 mg/24hr patch Place 1 patch (21 mg total) onto the skin daily. 28 patch 0   ondansetron (ZOFRAN) 4 MG tablet Take 8 mg by mouth every 8 (eight) hours.     oxyCODONE (OXY IR/ROXICODONE) 5 MG immediate release tablet TAKE 1 OR 2 TABLETS BY MOUTH EVERY 6 HOURS AS NEEDED 60 tablet 0   polyethylene glycol (MIRALAX / GLYCOLAX) 17 g packet Take 17 g by mouth daily as needed for mild constipation.     potassium chloride SA (KLOR-CON) 20 MEQ tablet Take 1 tablet (20 mEq total) by mouth 2 (two) times daily. 60 tablet 1   prochlorperazine (COMPAZINE) 10 MG tablet Take 10 mg by mouth every 6 (six) hours as needed  for nausea or vomiting.     No current facility-administered medications for this visit.    REVIEW OF SYSTEMS:   10 Point review of Systems was done is negative except as noted above.  PHYSICAL EXAMINATION: ECOG PERFORMANCE STATUS: 2 - Symptomatic, <50% confined to bed VS stable  NAD GENERAL:alert, in no acute distress and comfortable SKIN: no acute rashes, no significant lesions EYES: conjunctiva are pink and non-injected, sclera anicteric OROPHARYNX: MMM, no exudates, no oropharyngeal erythema or ulceration NECK: supple, no JVD LYMPH:  no palpable lymphadenopathy in  the cervical, axillary or inguinal regions LUNGS: clear to auscultation b/l with normal respiratory effort HEART: regular rate & rhythm ABDOMEN:  normoactive bowel sounds , non tender, not distended. Extremity: b/l trace  pedal edema PSYCH: alert & oriented x 3 with fluent speech NEURO: no focal motor/sensory deficits   LABORATORY DATA:  I have reviewed the data as listed  . CBC Latest Ref Rng & Units 09/10/2020 09/02/2020  WBC 4.0 - 10.5 K/uL 10.1 4.4  Hemoglobin 12.0 - 15.0 g/dL 12.7 13.2  Hematocrit 36.0 - 46.0 % 37.5 41.0  Platelets 150 - 400 K/uL 278 301   ANC1.6k  . CMP Latest Ref Rng & Units 09/10/2020 09/02/2020  Glucose 70 - 99 mg/dL 141(H) 121(H)  BUN 6 - 20 mg/dL 15 10  Creatinine 0.44 - 1.00 mg/dL 0.79 0.75  Sodium 135 - 145 mmol/L 139 138  Potassium 3.5 - 5.1 mmol/L 3.8 3.6  Chloride 98 - 111 mmol/L 105 103  CO2 22 - 32 mmol/L 22 27  Calcium 8.9 - 10.3 mg/dL 10.6(H) 9.5  Total Protein 6.5 - 8.1 g/dL 6.9 6.5  Total Bilirubin 0.3 - 1.2 mg/dL 0.4 0.6  Alkaline Phos 38 - 126 U/L 52 50  AST 15 - 41 U/L 12(L) 15  ALT 0 - 44 U/L 18 19     RADIOGRAPHIC STUDIES: I have personally reviewed the radiological images as listed and agreed with the findings in the report. DG THORACOLUMABAR SPINE  Result Date: 09/05/2020 CLINICAL DATA:  Elective surgery. EXAM: THORACOLUMBAR SPINE 1V COMPARISON:   None. FINDINGS: Frontal and lateral fluoroscopic spot views of the thoracolumbar spine obtained in the operating room. There is kyphoplasty within 2 vertebral bodies, labeled T12 and L4. Fluoroscopy time 2 minutes 25 seconds. Dose 153.56 mGy. IMPRESSION: Intraoperative fluoroscopy during kyphoplasty of T12 and L4. Electronically Signed   By: Keith Rake M.D.   On: 09/05/2020 15:07   DG C-Arm 1-60 Min  Result Date: 09/05/2020 CLINICAL DATA:  Elective surgery. EXAM: THORACOLUMBAR SPINE 1V COMPARISON:  None. FINDINGS: Frontal and lateral fluoroscopic spot views of the thoracolumbar spine obtained in the operating room. There is kyphoplasty within 2 vertebral bodies, labeled T12 and L4. Fluoroscopy time 2 minutes 25 seconds. Dose 153.56 mGy. IMPRESSION: Intraoperative fluoroscopy during kyphoplasty of T12 and L4. Electronically Signed   By: Keith Rake M.D.   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 3) anemia due to multiple myeloma becoming more apparent as the patient's hemoconcentration due to dehydration has been corrected.   4) acute renal failure related to dehydration hypercalcemia and multiple myeloma.  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 -will hold off on addiing Daratumumab since patient is proceeding for transplant soon  (Discussed with Dr Aris Lot) -Continue Ergocalciferol twice weekly. -Continue Lasix at 40 mg. -Continue Cytoxan dosage at 400 mg/m^2. Will monitor Hgb and Plt levels for potential transfusion needs. -Continue Eliquis. -adjusted dose  of gabapentin and increased fentanyl patch to 37.75mg/h to help with pain control and to reduce need for breakthorugh oxycodone as frequently. -continue working with therapies.   FOLLOW UP: Continue f/u as per currently scheduled appointment. We shall cancel scheduled chemotherapy appointment as per Auto HSCT schedule. My RN - shall call transplant team We would like to see her back about 60 days post transplant.    All of the patients questions were answered with apparent satisfaction. The patient knows to call the clinic with any problems, questions or concerns.   The total time spent in the appointment was 30 minutes and more than 50% was on counseling and direct patient cares.   GSullivan LoneMD MAllisonAAHIVMS SDaviess Community HospitalCKind[facility] Hospital - MansfieldHematology/Oncology Physician [facility] (Office):       [phone](Work cell):  [phone](Fax):           [phone] 09/09/2020 6:47 PM  I, RReinaldo Raddle am acting as scribe for Dr. GSullivan Lone MD  .I have reviewed the above documentation for accuracy and completeness, and I agree with the above. .Brunetta GeneraMD

## 2020-09-10 ENCOUNTER — Ambulatory Visit: Payer: 59 | Admitting: Hematology

## 2020-09-10 ENCOUNTER — Other Ambulatory Visit: Payer: 59

## 2020-09-10 ENCOUNTER — Ambulatory Visit: Payer: 59

## 2020-09-10 ENCOUNTER — Inpatient Hospital Stay: Payer: 59

## 2020-09-10 ENCOUNTER — Inpatient Hospital Stay: Payer: 59 | Admitting: Hematology

## 2020-09-10 ENCOUNTER — Other Ambulatory Visit: Payer: Self-pay

## 2020-09-10 ENCOUNTER — Encounter: Payer: Self-pay | Admitting: Hematology

## 2020-09-10 ENCOUNTER — Telehealth: Payer: Self-pay

## 2020-09-10 VITALS — BP 120/70 | HR 80 | Temp 98.7°F | Resp 18

## 2020-09-10 DIAGNOSIS — C7951 Secondary malignant neoplasm of bone: Secondary | ICD-10-CM

## 2020-09-10 DIAGNOSIS — C9000 Multiple myeloma not having achieved remission: Secondary | ICD-10-CM | POA: Diagnosis not present

## 2020-09-10 DIAGNOSIS — Z5111 Encounter for antineoplastic chemotherapy: Secondary | ICD-10-CM | POA: Diagnosis not present

## 2020-09-10 DIAGNOSIS — R609 Edema, unspecified: Secondary | ICD-10-CM

## 2020-09-10 DIAGNOSIS — Z7189 Other specified counseling: Secondary | ICD-10-CM

## 2020-09-10 LAB — CBC WITH DIFFERENTIAL/PLATELET
Abs Immature Granulocytes: 0.03 10*3/uL (ref 0.00–0.07)
Basophils Absolute: 0 10*3/uL (ref 0.0–0.1)
Basophils Relative: 0 %
Eosinophils Absolute: 0.1 10*3/uL (ref 0.0–0.5)
Eosinophils Relative: 1 %
HCT: 37.5 % (ref 36.0–46.0)
Hemoglobin: 12.7 g/dL (ref 12.0–15.0)
Immature Granulocytes: 0 %
Lymphocytes Relative: 4 %
Lymphs Abs: 0.4 10*3/uL — ABNORMAL LOW (ref 0.7–4.0)
MCH: 32.1 pg (ref 26.0–34.0)
MCHC: 33.9 g/dL (ref 30.0–36.0)
MCV: 94.7 fL (ref 80.0–100.0)
Monocytes Absolute: 0.8 10*3/uL (ref 0.1–1.0)
Monocytes Relative: 8 %
Neutro Abs: 8.7 10*3/uL — ABNORMAL HIGH (ref 1.7–7.7)
Neutrophils Relative %: 87 %
Platelets: 278 10*3/uL (ref 150–400)
RBC: 3.96 MIL/uL (ref 3.87–5.11)
RDW: 17.6 % — ABNORMAL HIGH (ref 11.5–15.5)
WBC: 10.1 10*3/uL (ref 4.0–10.5)
nRBC: 0 % (ref 0.0–0.2)

## 2020-09-10 LAB — CMP (CANCER CENTER ONLY)
ALT: 18 U/L (ref 0–44)
AST: 12 U/L — ABNORMAL LOW (ref 15–41)
Albumin: 3.8 g/dL (ref 3.5–5.0)
Alkaline Phosphatase: 52 U/L (ref 38–126)
Anion gap: 12 (ref 5–15)
BUN: 15 mg/dL (ref 6–20)
CO2: 22 mmol/L (ref 22–32)
Calcium: 10.6 mg/dL — ABNORMAL HIGH (ref 8.9–10.3)
Chloride: 105 mmol/L (ref 98–111)
Creatinine: 0.79 mg/dL (ref 0.44–1.00)
GFR, Estimated: >60 mL/min (ref >60)
Glucose, Bld: 141 mg/dL — ABNORMAL HIGH (ref 70–99)
Potassium: 3.8 mmol/L (ref 3.5–5.1)
Sodium: 139 mmol/L (ref 135–145)
Total Bilirubin: 0.4 mg/dL (ref 0.3–1.2)
Total Protein: 6.9 g/dL (ref 6.5–8.1)

## 2020-09-10 MED ORDER — SODIUM CHLORIDE 0.9 % IV SOLN
Freq: Once | INTRAVENOUS | Status: AC
  Filled 2020-09-10: qty 250

## 2020-09-10 MED ORDER — GABAPENTIN 300 MG PO CAPS: 300.0000 mg | ORAL_CAPSULE | ORAL | 0 refills | Status: DC

## 2020-09-10 MED ORDER — ACETAMINOPHEN 325 MG PO TABS
650.0000 mg | ORAL_TABLET | Freq: Once | ORAL | Status: AC
Start: 2020-09-10 — End: 2020-09-10
  Administered 2020-09-10: 650 mg via ORAL

## 2020-09-10 MED ORDER — SODIUM CHLORIDE 0.9 % IV SOLN: 8.0000 mg | Freq: Once | INTRAVENOUS | Status: DC

## 2020-09-10 MED ORDER — SODIUM CHLORIDE 0.9 % IV SOLN
350.0000 mg/m2 | Freq: Once | INTRAVENOUS | Status: AC
  Administered 2020-09-10: 800 mg via INTRAVENOUS
  Filled 2020-09-10: qty 40

## 2020-09-10 MED ORDER — ACETAMINOPHEN 325 MG PO TABS
ORAL_TABLET | ORAL | Status: AC
  Filled 2020-09-10: qty 2

## 2020-09-10 MED ORDER — SODIUM CHLORIDE 0.9 % IV SOLN: 380.0000 mg/m2 | Freq: Once | INTRAVENOUS | Status: DC

## 2020-09-10 MED ORDER — OXYCODONE HCL 5 MG PO TABS: 5.0000 mg | ORAL_TABLET | Freq: Four times a day (QID) | ORAL | 0 refills | Status: DC | PRN

## 2020-09-10 MED ORDER — ONDANSETRON HCL 4 MG/2ML IJ SOLN
8.0000 mg | Freq: Once | INTRAMUSCULAR | Status: AC
  Administered 2020-09-10: 8 mg via INTRAVENOUS

## 2020-09-10 MED ORDER — ONDANSETRON HCL 4 MG/2ML IJ SOLN
INTRAMUSCULAR | Status: AC
  Filled 2020-09-10: qty 4

## 2020-09-10 MED ORDER — SODIUM CHLORIDE 0.9 % IV SOLN
20.0000 mg | Freq: Once | INTRAVENOUS | Status: AC
  Administered 2020-09-10: 20 mg via INTRAVENOUS
  Filled 2020-09-10: qty 20

## 2020-09-10 MED ORDER — FENTANYL 37.5 MCG/HR TD PT72: 37.5000 ug/h | MEDICATED_PATCH | TRANSDERMAL | 0 refills | Status: DC

## 2020-09-10 NOTE — Telephone Encounter (Signed)
Notified Patient of prior authorization approval for Fentanyl Patch. Authorization is effective from 09/10/20 through 09/11/22

## 2020-09-10 NOTE — Patient Instructions (Signed)
[facility]COL[facility]  Discharge Instructions: Thank you for choosing [facility] to provide your oncology and hematology care.   If you have a lab appointment with the [facility], please go directly to the [facility] and check in at the registration area.   Wear comfortable clothing and clothing appropriate for easy access to any Portacath or PICC line.   We strive to give you quality time with your provider. You may need to reschedule your appointment if you arrive late (15 or more minutes).  Arriving late affects you and other patients whose appointments are after yours.  Also, if you miss three or more appointments without notifying the office, you may be dismissed from the clinic at the provider's discretion.      For prescription refill requests, have your pharmacy contact our office and allow 72 hours for refills to be completed.    Today you received the following chemotherapy and/or immunotherapy agents Cytoxan      To help prevent nausea and vomiting after your treatment, we encourage you to take your nausea medication as directed.  BELOW ARE SYMPTOMS THAT SHOULD BE REPORTED IMMEDIATELY: *FEVER GREATER THAN 100.4 F (38 C) OR HIGHER *CHILLS OR SWEATING *NAUSEA AND VOMITING THAT IS NOT CONTROLLED WITH YOUR NAUSEA MEDICATION *UNUSUAL SHORTNESS OF BREATH *UNUSUAL BRUISING OR BLEEDING *URINARY PROBLEMS (pain or burning when urinating, or frequent urination) *BOWEL PROBLEMS (unusual diarrhea, constipation, pain near the anus) TENDERNESS IN MOUTH AND THROAT WITH OR WITHOUT PRESENCE OF ULCERS (sore throat, sores in mouth, or a toothache) UNUSUAL RASH, SWELLING OR PAIN  UNUSUAL VAGINAL DISCHARGE OR ITCHING   Items with * indicate a potential emergency and should be followed up as soon as possible or go to the Emergency Department if any problems should occur.  Please show the CHEMOTHERAPY ALERT CARD or IMMUNOTHERAPY ALERT CARD at check-in to the  Emergency Department and triage nurse.  Should you have questions after your visit or need to cancel or reschedule your appointment, please contact [facility]  Dept: [phone]  and follow the prompts.  Office hours are 8:00 a.m. to 4:30 p.m. Monday - Friday. Please note that voicemails left after 4:00 p.m. may not be returned until the following business day.  We are closed weekends and major holidays. You have access to a nurse at all times for urgent questions. Please call the main number to the clinic Dept: [phone] and follow the prompts.   For any non-urgent questions, you may also contact your provider using MyChart. We now offer e-Visits for anyone 88 and older to request care online for non-urgent symptoms. For details visit [email or web address].   Also download the MyChart app! Go to the app store, search "MyChart", open the app, select [facility], and log in with your MyChart username and password.  Due to Covid, a mask is required upon entering the hospital/clinic. If you do not have a mask, one will be given to you upon arrival. For doctor visits, patients may have 1 support person aged 50 or older with them. For treatment visits, patients cannot have anyone with them due to current Covid guidelines and our immunocompromised population.

## 2020-09-13 ENCOUNTER — Ambulatory Visit: Payer: 59

## 2020-09-13 ENCOUNTER — Other Ambulatory Visit: Payer: 59

## 2020-09-16 ENCOUNTER — Inpatient Hospital Stay: Payer: 59 | Attending: Hematology

## 2020-09-16 ENCOUNTER — Ambulatory Visit: Payer: BC Managed Care – PPO

## 2020-09-16 ENCOUNTER — Other Ambulatory Visit: Payer: 59

## 2020-09-16 ENCOUNTER — Inpatient Hospital Stay: Payer: 59

## 2020-09-16 ENCOUNTER — Other Ambulatory Visit: Payer: Self-pay

## 2020-09-16 VITALS — BP 132/70 | HR 80 | Temp 98.2°F | Resp 16

## 2020-09-16 DIAGNOSIS — Z7189 Other specified counseling: Secondary | ICD-10-CM

## 2020-09-16 DIAGNOSIS — Z5111 Encounter for antineoplastic chemotherapy: Secondary | ICD-10-CM

## 2020-09-16 DIAGNOSIS — Z79899 Other long term (current) drug therapy: Secondary | ICD-10-CM | POA: Insufficient documentation

## 2020-09-16 DIAGNOSIS — C9000 Multiple myeloma not having achieved remission: Secondary | ICD-10-CM

## 2020-09-16 DIAGNOSIS — C7951 Secondary malignant neoplasm of bone: Secondary | ICD-10-CM

## 2020-09-16 LAB — CMP (CANCER CENTER ONLY)
ALT: 22 U/L (ref 0–44)
AST: 13 U/L — ABNORMAL LOW (ref 15–41)
Albumin: 3.8 g/dL (ref 3.5–5.0)
Alkaline Phosphatase: 57 U/L (ref 38–126)
Anion gap: 10 (ref 5–15)
BUN: 10 mg/dL (ref 6–20)
CO2: 26 mmol/L (ref 22–32)
Calcium: 10.6 mg/dL — ABNORMAL HIGH (ref 8.9–10.3)
Chloride: 104 mmol/L (ref 98–111)
Creatinine: 0.78 mg/dL (ref 0.44–1.00)
GFR, Estimated: >60 mL/min (ref >60)
Glucose, Bld: 108 mg/dL — ABNORMAL HIGH (ref 70–99)
Potassium: 4 mmol/L (ref 3.5–5.1)
Sodium: 140 mmol/L (ref 135–145)
Total Bilirubin: 0.4 mg/dL (ref 0.3–1.2)
Total Protein: 6.6 g/dL (ref 6.5–8.1)

## 2020-09-16 LAB — CBC WITH DIFFERENTIAL/PLATELET
Abs Immature Granulocytes: 0.02 10*3/uL (ref 0.00–0.07)
Basophils Absolute: 0 10*3/uL (ref 0.0–0.1)
Basophils Relative: 0 %
Eosinophils Absolute: 0.1 10*3/uL (ref 0.0–0.5)
Eosinophils Relative: 2 %
HCT: 37.9 % (ref 36.0–46.0)
Hemoglobin: 12.5 g/dL (ref 12.0–15.0)
Immature Granulocytes: 0 %
Lymphocytes Relative: 4 %
Lymphs Abs: 0.3 10*3/uL — ABNORMAL LOW (ref 0.7–4.0)
MCH: 31.6 pg (ref 26.0–34.0)
MCHC: 33 g/dL (ref 30.0–36.0)
MCV: 95.7 fL (ref 80.0–100.0)
Monocytes Absolute: 0.5 10*3/uL (ref 0.1–1.0)
Monocytes Relative: 7 %
Neutro Abs: 5.5 10*3/uL (ref 1.7–7.7)
Neutrophils Relative %: 87 %
Platelets: 272 10*3/uL (ref 150–400)
RBC: 3.96 MIL/uL (ref 3.87–5.11)
RDW: 16.8 % — ABNORMAL HIGH (ref 11.5–15.5)
WBC: 6.5 10*3/uL (ref 4.0–10.5)
nRBC: 0 % (ref 0.0–0.2)

## 2020-09-16 MED ORDER — ACETAMINOPHEN 325 MG PO TABS
ORAL_TABLET | ORAL | Status: AC
  Filled 2020-09-16: qty 2

## 2020-09-16 MED ORDER — SODIUM CHLORIDE 0.9 % IV SOLN
20.0000 mg | Freq: Once | INTRAVENOUS | Status: AC
  Administered 2020-09-16: 20 mg via INTRAVENOUS
  Filled 2020-09-16: qty 20

## 2020-09-16 MED ORDER — SODIUM CHLORIDE 0.9 % IV SOLN
Freq: Once | INTRAVENOUS | Status: AC
  Filled 2020-09-16: qty 250

## 2020-09-16 MED ORDER — ZOLEDRONIC ACID 4 MG/100ML IV SOLN
INTRAVENOUS | Status: AC
  Filled 2020-09-16: qty 100

## 2020-09-16 MED ORDER — SODIUM CHLORIDE 0.9 % IV SOLN
350.0000 mg/m2 | Freq: Once | INTRAVENOUS | Status: AC
  Administered 2020-09-16: 800 mg via INTRAVENOUS
  Filled 2020-09-16: qty 40

## 2020-09-16 MED ORDER — ACETAMINOPHEN 325 MG PO TABS
650.0000 mg | ORAL_TABLET | Freq: Once | ORAL | Status: AC
  Administered 2020-09-16: 650 mg via ORAL

## 2020-09-16 MED ORDER — ONDANSETRON HCL 4 MG/2ML IJ SOLN
8.0000 mg | Freq: Once | INTRAMUSCULAR | Status: AC
  Administered 2020-09-16: 8 mg via INTRAVENOUS

## 2020-09-16 MED ORDER — SODIUM CHLORIDE 0.9 % IV SOLN: 8.0000 mg | Freq: Once | INTRAVENOUS | Status: DC

## 2020-09-16 MED ORDER — ZOLEDRONIC ACID 4 MG/100ML IV SOLN
4.0000 mg | Freq: Once | INTRAVENOUS | Status: AC
  Administered 2020-09-16: 4 mg via INTRAVENOUS

## 2020-09-16 MED ORDER — ONDANSETRON HCL 4 MG/2ML IJ SOLN
INTRAMUSCULAR | Status: AC
  Filled 2020-09-16: qty 4

## 2020-09-16 NOTE — Patient Instructions (Signed)
[facility]COL[facility]   Discharge Instructions: Thank you for choosing [facility] to provide your oncology and hematology care.   If you have a lab appointment with the [facility], please go directly to the [facility] and check in at the registration area.   Wear comfortable clothing and clothing appropriate for easy access to any Portacath or PICC line.   We strive to give you quality time with your provider. You may need to reschedule your appointment if you arrive late (15 or more minutes).  Arriving late affects you and other patients whose appointments are after yours.  Also, if you miss three or more appointments without notifying the office, you may be dismissed from the clinic at the provider's discretion.      For prescription refill requests, have your pharmacy contact our office and allow 72 hours for refills to be completed.    Today you received the following chemotherapy and/or immunotherapy agents: cyclophosphamide.      To help prevent nausea and vomiting after your treatment, we encourage you to take your nausea medication as directed.  BELOW ARE SYMPTOMS THAT SHOULD BE REPORTED IMMEDIATELY: *FEVER GREATER THAN 100.4 F (38 C) OR HIGHER *CHILLS OR SWEATING *NAUSEA AND VOMITING THAT IS NOT CONTROLLED WITH YOUR NAUSEA MEDICATION *UNUSUAL SHORTNESS OF BREATH *UNUSUAL BRUISING OR BLEEDING *URINARY PROBLEMS (pain or burning when urinating, or frequent urination) *BOWEL PROBLEMS (unusual diarrhea, constipation, pain near the anus) TENDERNESS IN MOUTH AND THROAT WITH OR WITHOUT PRESENCE OF ULCERS (sore throat, sores in mouth, or a toothache) UNUSUAL RASH, SWELLING OR PAIN  UNUSUAL VAGINAL DISCHARGE OR ITCHING   Items with * indicate a potential emergency and should be followed up as soon as possible or go to the Emergency Department if any problems should occur.  Please show the CHEMOTHERAPY ALERT CARD or IMMUNOTHERAPY ALERT CARD at  check-in to the Emergency Department and triage nurse.  Should you have questions after your visit or need to cancel or reschedule your appointment, please contact [facility]  Dept: [phone]  and follow the prompts.  Office hours are 8:00 a.m. to 4:30 p.m. Monday - Friday. Please note that voicemails left after 4:00 p.m. may not be returned until the following business day.  We are closed weekends and major holidays. You have access to a nurse at all times for urgent questions. Please call the main number to the clinic Dept: [phone] and follow the prompts.   For any non-urgent questions, you may also contact your provider using MyChart. We now offer e-Visits for anyone 70 and older to request care online for non-urgent symptoms. For details visit [email or web address].   Also download the MyChart app! Go to the app store, search "MyChart", open the app, select [facility], and log in with your MyChart username and password.  Due to Covid, a mask is required upon entering the hospital/clinic. If you do not have a mask, one will be given to you upon arrival. For doctor visits, patients may have 1 support person aged 1 or older with them. For treatment visits, patients cannot have anyone with them due to current Covid guidelines and our immunocompromised population.

## 2020-09-17 ENCOUNTER — Encounter: Payer: Self-pay | Admitting: Hematology

## 2020-09-20 ENCOUNTER — Other Ambulatory Visit: Payer: 59

## 2020-09-20 ENCOUNTER — Ambulatory Visit: Payer: 59

## 2020-09-23 ENCOUNTER — Other Ambulatory Visit: Payer: 59

## 2020-09-23 ENCOUNTER — Ambulatory Visit: Payer: 59

## 2020-09-30 ENCOUNTER — Other Ambulatory Visit: Payer: Self-pay | Admitting: Hematology

## 2020-09-30 ENCOUNTER — Ambulatory Visit: Payer: 59

## 2020-09-30 ENCOUNTER — Other Ambulatory Visit: Payer: 59

## 2020-09-30 DIAGNOSIS — C7951 Secondary malignant neoplasm of bone: Secondary | ICD-10-CM

## 2020-09-30 DIAGNOSIS — C9000 Multiple myeloma not having achieved remission: Secondary | ICD-10-CM

## 2020-09-30 DIAGNOSIS — R609 Edema, unspecified: Secondary | ICD-10-CM

## 2020-10-01 ENCOUNTER — Encounter: Payer: Self-pay | Admitting: Hematology

## 2020-10-01 ENCOUNTER — Other Ambulatory Visit: Payer: Self-pay

## 2020-10-07 ENCOUNTER — Other Ambulatory Visit: Payer: 59

## 2020-10-07 ENCOUNTER — Ambulatory Visit: Payer: 59

## 2020-10-07 ENCOUNTER — Other Ambulatory Visit: Payer: Self-pay | Admitting: Hematology

## 2020-10-08 ENCOUNTER — Encounter: Payer: Self-pay | Admitting: Hematology

## 2020-10-08 ENCOUNTER — Other Ambulatory Visit: Payer: Self-pay | Admitting: Hematology

## 2020-10-08 DIAGNOSIS — C7951 Secondary malignant neoplasm of bone: Secondary | ICD-10-CM

## 2020-10-14 ENCOUNTER — Ambulatory Visit: Payer: 59

## 2020-11-04 ENCOUNTER — Other Ambulatory Visit: Payer: Self-pay

## 2020-11-04 ENCOUNTER — Inpatient Hospital Stay: Payer: 59 | Attending: Hematology

## 2020-11-04 DIAGNOSIS — C9000 Multiple myeloma not having achieved remission: Secondary | ICD-10-CM | POA: Insufficient documentation

## 2020-11-04 LAB — CMP (CANCER CENTER ONLY)
ALT: 49 U/L — ABNORMAL HIGH (ref 0–44)
AST: 24 U/L (ref 15–41)
Albumin: 3.9 g/dL (ref 3.5–5.0)
Alkaline Phosphatase: 74 U/L (ref 38–126)
Anion gap: 10 (ref 5–15)
BUN: 12 mg/dL (ref 6–20)
CO2: 22 mmol/L (ref 22–32)
Calcium: 9.6 mg/dL (ref 8.9–10.3)
Chloride: 105 mmol/L (ref 98–111)
Creatinine: 0.79 mg/dL (ref 0.44–1.00)
GFR, Estimated: >60 mL/min (ref >60)
Glucose, Bld: 109 mg/dL — ABNORMAL HIGH (ref 70–99)
Potassium: 4.1 mmol/L (ref 3.5–5.1)
Sodium: 137 mmol/L (ref 135–145)
Total Bilirubin: 0.4 mg/dL (ref 0.3–1.2)
Total Protein: 6.7 g/dL (ref 6.5–8.1)

## 2020-11-04 LAB — CBC WITH DIFFERENTIAL (CANCER CENTER ONLY)
Abs Immature Granulocytes: 0.08 10*3/uL — ABNORMAL HIGH (ref 0.00–0.07)
Basophils Absolute: 0 10*3/uL (ref 0.0–0.1)
Basophils Relative: 0 %
Eosinophils Absolute: 0 10*3/uL (ref 0.0–0.5)
Eosinophils Relative: 0 %
HCT: 32 % — ABNORMAL LOW (ref 36.0–46.0)
Hemoglobin: 10.5 g/dL — ABNORMAL LOW (ref 12.0–15.0)
Immature Granulocytes: 2 %
Lymphocytes Relative: 8 %
Lymphs Abs: 0.4 10*3/uL — ABNORMAL LOW (ref 0.7–4.0)
MCH: 33 pg (ref 26.0–34.0)
MCHC: 32.8 g/dL (ref 30.0–36.0)
MCV: 100.6 fL — ABNORMAL HIGH (ref 80.0–100.0)
Monocytes Absolute: 1.2 10*3/uL — ABNORMAL HIGH (ref 0.1–1.0)
Monocytes Relative: 22 %
Neutro Abs: 3.4 10*3/uL (ref 1.7–7.7)
Neutrophils Relative %: 68 %
Platelet Count: 104 10*3/uL — ABNORMAL LOW (ref 150–400)
RBC: 3.18 MIL/uL — ABNORMAL LOW (ref 3.87–5.11)
RDW: 17.5 % — ABNORMAL HIGH (ref 11.5–15.5)
WBC Count: 5.1 10*3/uL (ref 4.0–10.5)
nRBC: 0 % (ref 0.0–0.2)

## 2020-11-04 LAB — MAGNESIUM: Magnesium: 2 mg/dL (ref 1.7–2.4)

## 2020-11-07 ENCOUNTER — Other Ambulatory Visit: Payer: Self-pay | Admitting: Hematology

## 2020-11-07 DIAGNOSIS — C7951 Secondary malignant neoplasm of bone: Secondary | ICD-10-CM

## 2020-11-07 DIAGNOSIS — C9000 Multiple myeloma not having achieved remission: Secondary | ICD-10-CM

## 2020-11-07 DIAGNOSIS — R609 Edema, unspecified: Secondary | ICD-10-CM

## 2020-11-08 ENCOUNTER — Other Ambulatory Visit: Payer: Self-pay

## 2020-11-11 ENCOUNTER — Ambulatory Visit: Payer: 59

## 2020-11-12 ENCOUNTER — Encounter: Payer: Self-pay | Admitting: Hematology

## 2020-11-15 DIAGNOSIS — D72810 Lymphocytopenia: Secondary | ICD-10-CM | POA: Insufficient documentation

## 2020-11-19 ENCOUNTER — Telehealth: Payer: Self-pay | Admitting: Hematology

## 2020-11-19 NOTE — Telephone Encounter (Signed)
Rescheduled Per 10/3 sch msg, pt was called and confirmed appt

## 2020-12-04 ENCOUNTER — Other Ambulatory Visit: Payer: Self-pay | Admitting: Hematology

## 2020-12-04 DIAGNOSIS — R609 Edema, unspecified: Secondary | ICD-10-CM

## 2020-12-04 DIAGNOSIS — C7951 Secondary malignant neoplasm of bone: Secondary | ICD-10-CM

## 2020-12-04 DIAGNOSIS — C9000 Multiple myeloma not having achieved remission: Secondary | ICD-10-CM

## 2020-12-05 ENCOUNTER — Other Ambulatory Visit: Payer: Self-pay

## 2020-12-05 DIAGNOSIS — C9000 Multiple myeloma not having achieved remission: Secondary | ICD-10-CM

## 2020-12-06 ENCOUNTER — Inpatient Hospital Stay: Payer: 59 | Admitting: Hematology

## 2020-12-06 ENCOUNTER — Other Ambulatory Visit: Payer: Self-pay

## 2020-12-06 ENCOUNTER — Inpatient Hospital Stay: Payer: 59 | Attending: Hematology

## 2020-12-06 VITALS — BP 140/83 | HR 105 | Temp 98.5°F | Resp 18 | Ht 64.0 in | Wt 254.3 lb

## 2020-12-06 DIAGNOSIS — E860 Dehydration: Secondary | ICD-10-CM | POA: Insufficient documentation

## 2020-12-06 DIAGNOSIS — Z800 Family history of malignant neoplasm of digestive organs: Secondary | ICD-10-CM | POA: Diagnosis not present

## 2020-12-06 DIAGNOSIS — M4850XA Collapsed vertebra, not elsewhere classified, site unspecified, initial encounter for fracture: Secondary | ICD-10-CM | POA: Insufficient documentation

## 2020-12-06 DIAGNOSIS — M5410 Radiculopathy, site unspecified: Secondary | ICD-10-CM | POA: Diagnosis not present

## 2020-12-06 DIAGNOSIS — Z9481 Bone marrow transplant status: Secondary | ICD-10-CM | POA: Diagnosis not present

## 2020-12-06 DIAGNOSIS — C9000 Multiple myeloma not having achieved remission: Secondary | ICD-10-CM

## 2020-12-06 DIAGNOSIS — D630 Anemia in neoplastic disease: Secondary | ICD-10-CM | POA: Diagnosis not present

## 2020-12-06 DIAGNOSIS — C7951 Secondary malignant neoplasm of bone: Secondary | ICD-10-CM

## 2020-12-06 DIAGNOSIS — N179 Acute kidney failure, unspecified: Secondary | ICD-10-CM | POA: Insufficient documentation

## 2020-12-06 DIAGNOSIS — Z801 Family history of malignant neoplasm of trachea, bronchus and lung: Secondary | ICD-10-CM | POA: Insufficient documentation

## 2020-12-06 DIAGNOSIS — Z8616 Personal history of COVID-19: Secondary | ICD-10-CM | POA: Diagnosis not present

## 2020-12-06 DIAGNOSIS — F419 Anxiety disorder, unspecified: Secondary | ICD-10-CM | POA: Insufficient documentation

## 2020-12-06 DIAGNOSIS — Z87891 Personal history of nicotine dependence: Secondary | ICD-10-CM | POA: Insufficient documentation

## 2020-12-06 LAB — CMP (CANCER CENTER ONLY)
ALT: 36 U/L (ref 0–44)
AST: 26 U/L (ref 15–41)
Albumin: 4.1 g/dL (ref 3.5–5.0)
Alkaline Phosphatase: 73 U/L (ref 38–126)
Anion gap: 8 (ref 5–15)
BUN: 10 mg/dL (ref 6–20)
CO2: 26 mmol/L (ref 22–32)
Calcium: 9.5 mg/dL (ref 8.9–10.3)
Chloride: 105 mmol/L (ref 98–111)
Creatinine: 0.8 mg/dL (ref 0.44–1.00)
GFR, Estimated: >60 mL/min (ref >60)
Glucose, Bld: 122 mg/dL — ABNORMAL HIGH (ref 70–99)
Potassium: 3.4 mmol/L — ABNORMAL LOW (ref 3.5–5.1)
Sodium: 139 mmol/L (ref 135–145)
Total Bilirubin: 0.4 mg/dL (ref 0.3–1.2)
Total Protein: 7.2 g/dL (ref 6.5–8.1)

## 2020-12-06 LAB — MAGNESIUM: Magnesium: 2 mg/dL (ref 1.7–2.4)

## 2020-12-06 LAB — CBC WITH DIFFERENTIAL (CANCER CENTER ONLY)
Abs Immature Granulocytes: 0.04 10*3/uL (ref 0.00–0.07)
Basophils Absolute: 0 10*3/uL (ref 0.0–0.1)
Basophils Relative: 0 %
Eosinophils Absolute: 0.2 10*3/uL (ref 0.0–0.5)
Eosinophils Relative: 2 %
HCT: 34.4 % — ABNORMAL LOW (ref 36.0–46.0)
Hemoglobin: 10.9 g/dL — ABNORMAL LOW (ref 12.0–15.0)
Immature Granulocytes: 0 %
Lymphocytes Relative: 9 %
Lymphs Abs: 0.9 10*3/uL (ref 0.7–4.0)
MCH: 32.7 pg (ref 26.0–34.0)
MCHC: 31.7 g/dL (ref 30.0–36.0)
MCV: 103.3 fL — ABNORMAL HIGH (ref 80.0–100.0)
Monocytes Absolute: 0.9 10*3/uL (ref 0.1–1.0)
Monocytes Relative: 9 %
Neutro Abs: 8.5 10*3/uL — ABNORMAL HIGH (ref 1.7–7.7)
Neutrophils Relative %: 80 %
Platelet Count: 280 10*3/uL (ref 150–400)
RBC: 3.33 MIL/uL — ABNORMAL LOW (ref 3.87–5.11)
RDW: 18.4 % — ABNORMAL HIGH (ref 11.5–15.5)
WBC Count: 10.6 10*3/uL — ABNORMAL HIGH (ref 4.0–10.5)
nRBC: 0 % (ref 0.0–0.2)

## 2020-12-06 MED ORDER — FENTANYL 37.5 MCG/HR TD PT72: MEDICATED_PATCH | TRANSDERMAL | 0 refills | Status: DC

## 2020-12-06 MED ORDER — OXYCODONE HCL 5 MG PO TABS: ORAL_TABLET | ORAL | 0 refills | Status: DC

## 2020-12-06 MED ORDER — LORAZEPAM 0.5 MG PO TABS: ORAL_TABLET | ORAL | 0 refills | Status: DC

## 2020-12-06 MED ORDER — POTASSIUM CHLORIDE CRYS ER 20 MEQ PO TBCR: 20.0000 meq | EXTENDED_RELEASE_TABLET | Freq: Two times a day (BID) | ORAL | 1 refills | Status: DC

## 2020-12-06 MED ORDER — GABAPENTIN 300 MG PO CAPS: ORAL_CAPSULE | ORAL | 1 refills | Status: DC

## 2020-12-12 ENCOUNTER — Encounter: Payer: Self-pay | Admitting: Hematology

## 2020-12-12 NOTE — Progress Notes (Addendum)
HEMATOLOGY/ONCOLOGY CLINIC NOTE  Date of Service: .12/06/2020   Patient Care Team: Celene Squibb, MD as PCP - General (Internal Medicine) Corrie Mckusick, DO as Consulting Physician (Interventional Radiology)  CHIEF COMPLAINTS/PURPOSE OF CONSULTATION:  Multiple Myeloma- continued mx  HISTORY OF PRESENTING ILLNESS:  Please see previous note for HPI.  INTERVAL HISTORY:  Stacey Montoya is a wonderful 45 y.o. female who is here today for evaluation and management of high risk Myeloma and follow-up after her High-Dose Chemotherapy (HDCT) with Melphalan 200 mg/m2 and autologous stem cell reinfusion (SCT) on 10/15/2020.  Notes she has not had any need for transfusion support.  She feels she is starting to recover from the fatigue related to her transplant has been eating well and is starting to get more functionally active. She did have a COVID infection and was given antibody infusion by her transplant team.  Currently her symptoms have resolved. She is still taking her fentanyl patch and as needed oxycodone and we discussed trying to wean these off or potentially refer her to pain management clinic after her day 100 evaluation if there is no evidence of active myeloma. Patient notes she will try to wean down her pain medications. She also continues to be on Neurontin for her neuropathy/radiculopathy. Patient notes significant anxiety and wants a refill on her Ativan.  We discussed using this minimally since she is on multiple sedative medications.  She understands the risk of being on all these medications and will work with [place] to try to wean these off.  Currently patient notes no acute new bone pains.  No fevers no chills no night sweats. Has been scheduled to follow-up at [facility] for her day 100 PET CT scan and bone marrow evaluation at the end of November.  Lab results reviewed today 12/06/2020 CBC shows improving hemoglobin at 10.9, WBC count of 10.6k and platelets of 280k  CMP  shows mild hypokalemia at 3.4 otherwise unremarkable.  Magnesium 2.  MEDICAL HISTORY:  Past Medical History:  Diagnosis Date   Abnormal Pap smear of cervix    Back pain    Cancer ([place]) 04/02/2020   multiple myeloma   Hyperlipidemia    Hypertension     SURGICAL HISTORY: Past Surgical History:  Procedure Laterality Date   CERVICAL CONIZATION W/BX  12/02/2010   Procedure: CONIZATION CERVIX WITH BIOPSY;  Surgeon: Arloa Koh;  Location: [facility];  Service: Gynecology;  Laterality: N/A;  COLD KNIFE   CESAREAN SECTION     DILATION AND CURETTAGE OF UTERUS  12/02/2010   Procedure: DILATATION AND CURETTAGE (D&C);  Surgeon: Arloa Koh;  Location: [facility];  Service: Gynecology;  Laterality: N/A;   IR RADIOLOGIST EVAL & MGMT  04/16/2020   KYPHOPLASTY Bilateral 09/05/2020   Procedure: KYPHOPLASTY THORACIC TWELVE AND LUMBAR FOUR;  Surgeon: Consuella Lose, MD;  Location: [facility];  Service: Neurosurgery;  Laterality: Bilateral;   left hand     drain - infection   WISDOM TOOTH EXTRACTION      SOCIAL HISTORY: Social History   Socioeconomic History   Marital status: Married    Spouse name: Not on file   Number of children: Not on file   Years of education: Not on file   Highest education level: Not on file  Occupational History   Not on file  Tobacco Use   Smoking status: Former    Packs/day: 0.50    Years: 20.00    Pack years: 10.00    Types:  Cigarettes   Smokeless tobacco: Never  Vaping Use   Vaping Use: Some days  Substance and Sexual Activity   Alcohol use: Not Currently   Drug use: Not Currently   Sexual activity: Not Currently    Partners: Male    Birth control/protection: Condom  Other Topics Concern   Not on file  Social History Narrative   ** Merged History Encounter **       Social Determinants of Health   Financial Resource Strain: Not on file  Food Insecurity: Not on file  Transportation Needs: Not on file  Physical Activity: Not on file  Stress: Not on  file  Social Connections: Not on file  Intimate Partner Violence: Not on file    FAMILY HISTORY: Family History  Problem Relation Age of Onset   Lung cancer Maternal Grandfather    Pancreatic cancer Paternal Grandfather     ALLERGIES:  is allergic to zithromax [azithromycin].  MEDICATIONS:  Current Outpatient Medications  Medication Sig Dispense Refill   acyclovir (ZOVIRAX) 400 MG tablet TAKE (1) TABLET BY MOUTH TWICE DAILY. 60 tablet 0   albuterol (VENTOLIN HFA) 108 (90 Base) MCG/ACT inhaler Inhale 1-2 puffs into the lungs every 6 (six) hours as needed for wheezing.     amLODipine (NORVASC) 5 MG tablet Take 1 tablet (5 mg total) by mouth daily. 30 tablet 0   apixaban (ELIQUIS) 5 MG TABS tablet Take 1 tablet (5 mg total) by mouth 2 (two) times daily. 60 tablet 2   b complex vitamins capsule Take 1 capsule by mouth daily. 30 capsule    dexamethasone (DECADRON) 4 MG tablet Take 5 tablets (20 mg total) by mouth See admin instructions. Take 20 mg on day 15 of each chemo cycle     ergocalciferol (VITAMIN D2) 1.25 MG (50000 UT) capsule Take 1 capsule (50,000 Units total) by mouth 2 (two) times a week. 12 capsule 2   fentaNYL 37.5 MCG/HR PT72 Place 1 patch onto the skin every 3 (three) days. 10 patch 0   furosemide (LASIX) 20 MG tablet TAKE 3 TABLETS BY MOUTH DAILY. 90 tablet 0   gabapentin (NEURONTIN) 300 MG capsule Plz take 355m in the morning , 600 mg in the afternoon and 9045mat bedtime. 180 capsule 1   LORazepam (ATIVAN) 0.5 MG tablet TAKE (1) TABLET BY MOUTH EVERY SIX HOURS AS NEEDED. 30 tablet 0   magic mouthwash w/lidocaine SOLN Take 5 mLs by mouth 3 (three) times daily as needed for mouth pain. Dispense 40033mf magic mouthwash compounded preparation. Patient to use 5ml12mtimes daily as needed for throat discomfort. 80 ml viscous lidocaine 2% 80 ml Mylanta 80 ml diphenhydramine at 12.5 mg per 5 ml elixir 80 ml nystatin at 100,000U per 5 mL suspension 80 ml distilled water      medroxyPROGESTERone (PROVERA) 10 MG tablet Take 10 mg by mouth daily.     metoprolol tartrate (LOPRESSOR) 50 MG tablet Take 1 tablet (50 mg total) by mouth 2 (two) times daily. 60 tablet 0   nicotine (NICODERM CQ - DOSED IN MG/24 HOURS) 21 mg/24hr patch Place 1 patch (21 mg total) onto the skin daily. 28 patch 0   ondansetron (ZOFRAN) 4 MG tablet Take 8 mg by mouth every 8 (eight) hours.     oxyCODONE (OXY IR/ROXICODONE) 5 MG immediate release tablet TAKE 1 OR 2 TABLETS BY MOUTH EVERY 6 HOURS AS NEEDED 60 tablet 0   polyethylene glycol (MIRALAX / GLYCOLAX) 17 g packet Take  17 g by mouth daily as needed for mild constipation.     potassium chloride SA (KLOR-CON) 20 MEQ tablet Take 1 tablet (20 mEq total) by mouth 2 (two) times daily. 60 tablet 1   prochlorperazine (COMPAZINE) 10 MG tablet Take 10 mg by mouth every 6 (six) hours as needed for nausea or vomiting.     No current facility-administered medications for this visit.    REVIEW OF SYSTEMS:   .10 Point review of Systems was done is negative except as noted above.  PHYSICAL EXAMINATION: ECOG PERFORMANCE STATUS: 2 - Symptomatic, <50% confined to bed .BP 140/83 (BP Location: Left Arm, Patient Position: Sitting)   Pulse (!) 105 Comment: nurse notified  Temp 98.5 F (36.9 C) (Tympanic)   Resp 18   Ht 5' 4"  (1.626 m)   Wt 254 lb 4.8 oz (115.3 kg)   SpO2 100%   BMI 43.65 kg/m  . GENERAL:alert, in no acute distress and comfortable SKIN: no acute rashes, no significant lesions EYES: conjunctiva are pink and non-injected, sclera anicteric OROPHARYNX: MMM, no exudates, no oropharyngeal erythema or ulceration NECK: supple, no JVD LYMPH:  no palpable lymphadenopathy in the cervical, axillary or inguinal regions LUNGS: clear to auscultation b/l with normal respiratory effort HEART: regular rate & rhythm ABDOMEN:  normoactive bowel sounds , non tender, not distended. Extremity: 2+ bilateral pedal edema PSYCH: alert & oriented x 3 with  fluent speech NEURO: no focal motor/sensory deficits    LABORATORY DATA:  I have reviewed the data as listed  .Marland Kitchen CBC Latest Ref Rng & Units 12/06/2020 11/04/2020 09/16/2020  WBC 4.0 - 10.5 K/uL 10.6(H) 5.1 6.5  Hemoglobin 12.0 - 15.0 g/dL 10.9(L) 10.5(L) 12.5  Hematocrit 36.0 - 46.0 % 34.4(L) 32.0(L) 37.9  Platelets 150 - 400 K/uL 280 104(L) 272    . CMP Latest Ref Rng & Units 12/06/2020 11/04/2020 09/16/2020  Glucose 70 - 99 mg/dL 122(H) 109(H) 108(H)  BUN 6 - 20 mg/dL 10 12 10   Creatinine 0.44 - 1.00 mg/dL 0.80 0.79 0.78  Sodium 135 - 145 mmol/L 139 137 140  Potassium 3.5 - 5.1 mmol/L 3.4(L) 4.1 4.0  Chloride 98 - 111 mmol/L 105 105 104  CO2 22 - 32 mmol/L 26 22 26   Calcium 8.9 - 10.3 mg/dL 9.5 9.6 10.6(H)  Total Protein 6.5 - 8.1 g/dL 7.2 6.7 6.6  Total Bilirubin 0.3 - 1.2 mg/dL 0.4 0.4 0.4  Alkaline Phos 38 - 126 U/L 73 74 57  AST 15 - 41 U/L 26 24 13(L)  ALT 0 - 44 U/L 36 49(H) 22       RADIOGRAPHIC STUDIES: I have personally reviewed the radiological images as listed and agreed with the findings in the report. No results found.   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 3) anemia due to multiple myeloma becoming more apparent as the patient's hemoconcentration due to dehydration has been corrected.   4) acute renal failure related to dehydration hypercalcemia and multiple myeloma.  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 -Given prescription for gabapentin with dose adjustment . -Refilled fentanyl patch and as needed oxycodone . -Patient requested as needed Ativan for anxiety . -We had a detailed discussion regarding their concerns for the use of multiple sedative medications and the need to start weaning these down.  We discussed that if she has chronic pain issues after her myeloma is in remission we might need to refer her to pain management clinic. -on Eliquis -No transfusion requirements at this time.  She is recovering well from transplant. -She has a follow-up with [facility] for her day 100 post transplant evaluation with PET/CT and bone marrow biopsy with her transplant team.  During her evaluation we will determine further need for consolidative therapies or maintenance. -Post transplant vaccination as per transplant team. -Counseled on need for absolute smoking cessation. -On Eliquis for her DVT  FOLLOW UP: Portflush and labs in 4 weeks RTC with Dr Irene Limbo in 2nd week of jan 2023    All of the patients questions were answered with apparent satisfaction. The patient knows to call the clinic with any problems, questions or concerns.  . The total time spent in the appointment was 45 minutes and more than 50% was on counseling and direct patient cares, coordination of care with transplant team and review of outside records.    Sullivan Lone MD [place] AAHIVMS [facility] Hematology/Oncology Physician [facility]

## 2020-12-16 ENCOUNTER — Ambulatory Visit: Payer: 59 | Admitting: Hematology

## 2020-12-16 ENCOUNTER — Other Ambulatory Visit: Payer: 59

## 2020-12-20 ENCOUNTER — Inpatient Hospital Stay: Payer: 59

## 2021-01-02 ENCOUNTER — Other Ambulatory Visit: Payer: Self-pay | Admitting: Hematology

## 2021-01-02 DIAGNOSIS — C9000 Multiple myeloma not having achieved remission: Secondary | ICD-10-CM

## 2021-01-02 DIAGNOSIS — R609 Edema, unspecified: Secondary | ICD-10-CM

## 2021-01-02 DIAGNOSIS — C7951 Secondary malignant neoplasm of bone: Secondary | ICD-10-CM

## 2021-01-03 ENCOUNTER — Other Ambulatory Visit: Payer: Self-pay

## 2021-01-03 ENCOUNTER — Inpatient Hospital Stay: Payer: 59 | Attending: Hematology

## 2021-01-03 DIAGNOSIS — E860 Dehydration: Secondary | ICD-10-CM | POA: Insufficient documentation

## 2021-01-03 DIAGNOSIS — M4850XD Collapsed vertebra, not elsewhere classified, site unspecified, subsequent encounter for fracture with routine healing: Secondary | ICD-10-CM | POA: Diagnosis not present

## 2021-01-03 DIAGNOSIS — Z801 Family history of malignant neoplasm of trachea, bronchus and lung: Secondary | ICD-10-CM | POA: Diagnosis not present

## 2021-01-03 DIAGNOSIS — C9000 Multiple myeloma not having achieved remission: Secondary | ICD-10-CM | POA: Diagnosis present

## 2021-01-03 DIAGNOSIS — N179 Acute kidney failure, unspecified: Secondary | ICD-10-CM | POA: Insufficient documentation

## 2021-01-03 DIAGNOSIS — M5410 Radiculopathy, site unspecified: Secondary | ICD-10-CM | POA: Diagnosis not present

## 2021-01-03 DIAGNOSIS — F419 Anxiety disorder, unspecified: Secondary | ICD-10-CM | POA: Insufficient documentation

## 2021-01-03 DIAGNOSIS — Z87891 Personal history of nicotine dependence: Secondary | ICD-10-CM | POA: Diagnosis not present

## 2021-01-03 DIAGNOSIS — Z8616 Personal history of COVID-19: Secondary | ICD-10-CM | POA: Diagnosis not present

## 2021-01-03 DIAGNOSIS — D630 Anemia in neoplastic disease: Secondary | ICD-10-CM | POA: Diagnosis not present

## 2021-01-03 DIAGNOSIS — Z800 Family history of malignant neoplasm of digestive organs: Secondary | ICD-10-CM | POA: Insufficient documentation

## 2021-01-03 LAB — CMP (CANCER CENTER ONLY)
ALT: 32 U/L (ref 0–44)
AST: 17 U/L (ref 15–41)
Albumin: 4 g/dL (ref 3.5–5.0)
Alkaline Phosphatase: 79 U/L (ref 38–126)
Anion gap: 11 (ref 5–15)
BUN: 14 mg/dL (ref 6–20)
CO2: 26 mmol/L (ref 22–32)
Calcium: 10.1 mg/dL (ref 8.9–10.3)
Chloride: 105 mmol/L (ref 98–111)
Creatinine: 0.85 mg/dL (ref 0.44–1.00)
GFR, Estimated: >60 mL/min (ref >60)
Glucose, Bld: 110 mg/dL — ABNORMAL HIGH (ref 70–99)
Potassium: 3.9 mmol/L (ref 3.5–5.1)
Sodium: 142 mmol/L (ref 135–145)
Total Bilirubin: 0.3 mg/dL (ref 0.3–1.2)
Total Protein: 6.8 g/dL (ref 6.5–8.1)

## 2021-01-03 LAB — CBC WITH DIFFERENTIAL (CANCER CENTER ONLY)
Abs Immature Granulocytes: 0.02 10*3/uL (ref 0.00–0.07)
Basophils Absolute: 0 10*3/uL (ref 0.0–0.1)
Basophils Relative: 0 %
Eosinophils Absolute: 0.3 10*3/uL (ref 0.0–0.5)
Eosinophils Relative: 3 %
HCT: 34.9 % — ABNORMAL LOW (ref 36.0–46.0)
Hemoglobin: 11.5 g/dL — ABNORMAL LOW (ref 12.0–15.0)
Immature Granulocytes: 0 %
Lymphocytes Relative: 10 %
Lymphs Abs: 0.8 10*3/uL (ref 0.7–4.0)
MCH: 33.2 pg (ref 26.0–34.0)
MCHC: 33 g/dL (ref 30.0–36.0)
MCV: 100.9 fL — ABNORMAL HIGH (ref 80.0–100.0)
Monocytes Absolute: 0.6 10*3/uL (ref 0.1–1.0)
Monocytes Relative: 7 %
Neutro Abs: 6.4 10*3/uL (ref 1.7–7.7)
Neutrophils Relative %: 80 %
Platelet Count: 286 10*3/uL (ref 150–400)
RBC: 3.46 MIL/uL — ABNORMAL LOW (ref 3.87–5.11)
RDW: 15.7 % — ABNORMAL HIGH (ref 11.5–15.5)
WBC Count: 8 10*3/uL (ref 4.0–10.5)
nRBC: 0 % (ref 0.0–0.2)

## 2021-01-03 LAB — MAGNESIUM: Magnesium: 1.8 mg/dL (ref 1.7–2.4)

## 2021-01-11 ENCOUNTER — Other Ambulatory Visit: Payer: Self-pay | Admitting: Hematology

## 2021-01-12 ENCOUNTER — Encounter: Payer: Self-pay | Admitting: Hematology

## 2021-01-13 ENCOUNTER — Other Ambulatory Visit: Payer: Self-pay | Admitting: Hematology

## 2021-01-27 ENCOUNTER — Other Ambulatory Visit: Payer: Self-pay | Admitting: Hematology

## 2021-01-29 ENCOUNTER — Other Ambulatory Visit: Payer: Self-pay | Admitting: Hematology

## 2021-01-31 ENCOUNTER — Encounter: Payer: Self-pay | Admitting: Hematology

## 2021-02-07 ENCOUNTER — Other Ambulatory Visit: Payer: Self-pay | Admitting: Hematology

## 2021-02-07 DIAGNOSIS — C9000 Multiple myeloma not having achieved remission: Secondary | ICD-10-CM

## 2021-02-07 DIAGNOSIS — R609 Edema, unspecified: Secondary | ICD-10-CM

## 2021-02-07 DIAGNOSIS — C7951 Secondary malignant neoplasm of bone: Secondary | ICD-10-CM

## 2021-02-24 ENCOUNTER — Other Ambulatory Visit: Payer: Self-pay | Admitting: Hematology

## 2021-02-28 ENCOUNTER — Inpatient Hospital Stay: Payer: 59 | Attending: Hematology | Admitting: Hematology

## 2021-02-28 ENCOUNTER — Other Ambulatory Visit: Payer: Self-pay

## 2021-02-28 VITALS — BP 121/64 | HR 87 | Temp 98.1°F | Resp 20 | Wt 248.7 lb

## 2021-02-28 DIAGNOSIS — C9001 Multiple myeloma in remission: Secondary | ICD-10-CM

## 2021-02-28 DIAGNOSIS — C9000 Multiple myeloma not having achieved remission: Secondary | ICD-10-CM | POA: Diagnosis present

## 2021-02-28 DIAGNOSIS — G893 Neoplasm related pain (acute) (chronic): Secondary | ICD-10-CM

## 2021-02-28 DIAGNOSIS — C7951 Secondary malignant neoplasm of bone: Secondary | ICD-10-CM

## 2021-02-28 DIAGNOSIS — Z79899 Other long term (current) drug therapy: Secondary | ICD-10-CM | POA: Diagnosis not present

## 2021-02-28 DIAGNOSIS — Z87891 Personal history of nicotine dependence: Secondary | ICD-10-CM | POA: Insufficient documentation

## 2021-02-28 DIAGNOSIS — R609 Edema, unspecified: Secondary | ICD-10-CM

## 2021-02-28 MED ORDER — FUROSEMIDE 20 MG PO TABS: 60.0000 mg | ORAL_TABLET | Freq: Every day | ORAL | 0 refills | Status: DC

## 2021-03-03 ENCOUNTER — Other Ambulatory Visit: Payer: Self-pay | Admitting: Hematology

## 2021-03-04 ENCOUNTER — Encounter: Payer: Self-pay | Admitting: Hematology

## 2021-03-05 ENCOUNTER — Telehealth: Payer: Self-pay | Admitting: Hematology

## 2021-03-05 NOTE — Telephone Encounter (Signed)
Scheduled follow-up appointments per 11/3 los. Patient is aware. 

## 2021-03-06 ENCOUNTER — Encounter: Payer: Self-pay | Admitting: Hematology

## 2021-03-06 NOTE — Progress Notes (Addendum)
HEMATOLOGY/ONCOLOGY CLINIC NOTE  Date of Service: .02/28/2021   Patient Care Team: Celene Squibb, MD as PCP - General (Internal Medicine) Corrie Mckusick, DO as Consulting Physician (Interventional Radiology)  CHIEF COMPLAINTS/PURPOSE OF CONSULTATION:  Follow-up for continued evaluation and management of multiple myeloma  HISTORY OF PRESENTING ILLNESS:  Please see previous note for HPI.  INTERVAL HISTORY:  Stacey Montoya is here with her mother for continued evaluation and management of her multiple myeloma. She recently follow-up with her transplant team at [facility] for her D+ 100 post transplant follow-up. PET CT scan done 01/28/2021 showed no evidence of hypermetabolic osseous or extraosseous disease. Bone marrow biopsy showed sCR and MRD negative status. She was recommended to start on maintenance Revlimid and to continue her bisphosphonates.  Patient notes she is feeling much better.  Her back pain has been improving and she has become more physically active. Improved lower extremity swelling. No significant infection issues. No other acute new symptoms.  We discussed the role of maintenance therapy in detail and she is agreeable to initiate maintenance treatment with Revlimid with continued Eliquis for DVT prophylaxis given her previous history of DVT.  We also discussed role of gradually weaning off of her narcotic pain medications.  MEDICAL HISTORY:  Past Medical History:  Diagnosis Date   Abnormal Pap smear of cervix    Back pain    Cancer ([place]) 04/02/2020   multiple myeloma   Hyperlipidemia    Hypertension     SURGICAL HISTORY: Past Surgical History:  Procedure Laterality Date   CERVICAL CONIZATION W/BX  12/02/2010   Procedure: CONIZATION CERVIX WITH BIOPSY;  Surgeon: Arloa Koh;  Location: [facility];  Service: Gynecology;  Laterality: N/A;  COLD KNIFE   CESAREAN SECTION     DILATION AND CURETTAGE OF UTERUS  12/02/2010   Procedure:  DILATATION AND CURETTAGE (D&C);  Surgeon: Arloa Koh;  Location: [facility];  Service: Gynecology;  Laterality: N/A;   IR RADIOLOGIST EVAL & MGMT  04/16/2020   KYPHOPLASTY Bilateral 09/05/2020   Procedure: KYPHOPLASTY THORACIC TWELVE AND LUMBAR FOUR;  Surgeon: Consuella Lose, MD;  Location: [facility];  Service: Neurosurgery;  Laterality: Bilateral;   left hand     drain - infection   WISDOM TOOTH EXTRACTION      SOCIAL HISTORY: Social History   Socioeconomic History   Marital status: Married    Spouse name: Not on file   Number of children: Not on file   Years of education: Not on file   Highest education level: Not on file  Occupational History   Not on file  Tobacco Use   Smoking status: Former    Packs/day: 0.50    Years: 20.00    Pack years: 10.00    Types: Cigarettes   Smokeless tobacco: Never  Vaping Use   Vaping Use: Some days  Substance and Sexual Activity   Alcohol use: Not Currently   Drug use: Not Currently   Sexual activity: Not Currently    Partners: Male    Birth control/protection: Condom  Other Topics Concern   Not on file  Social History Narrative   ** Merged History Encounter **       Social Determinants of Health   Financial Resource Strain: Not on file  Food Insecurity: Not on file  Transportation Needs: Not on file  Physical Activity: Not on file  Stress: Not on file  Social Connections: Not on file  Intimate Partner Violence: Not on  file    FAMILY HISTORY: Family History  Problem Relation Age of Onset   Lung cancer Maternal Grandfather    Pancreatic cancer Paternal Grandfather     ALLERGIES:  is allergic to zithromax [azithromycin].  MEDICATIONS:  Current Outpatient Medications  Medication Sig Dispense Refill   acyclovir (ZOVIRAX) 400 MG tablet TAKE (1) TABLET BY MOUTH TWICE DAILY. 60 tablet 0   albuterol (VENTOLIN HFA) 108 (90 Base) MCG/ACT inhaler Inhale 1-2 puffs into the lungs every 6 (six) hours as needed for wheezing.      amLODipine (NORVASC) 5 MG tablet Take 1 tablet (5 mg total) by mouth daily. 30 tablet 0   b complex vitamins capsule Take 1 capsule by mouth daily. 30 capsule    dexamethasone (DECADRON) 4 MG tablet Take 5 tablets (20 mg total) by mouth See admin instructions. Take 20 mg on day 15 of each chemo cycle     ELIQUIS 5 MG TABS tablet TAKE 1 TABLET BY MOUTH TWICE DAILY. 60 tablet 0   ergocalciferol (VITAMIN D2) 1.25 MG (50000 UT) capsule Take 1 capsule (50,000 Units total) by mouth 2 (two) times a week. 12 capsule 2   fentaNYL 37.5 MCG/HR PT72 Place 1 patch onto the skin every 3 (three) days. 10 patch 0   furosemide (LASIX) 20 MG tablet Take 3 tablets (60 mg total) by mouth daily. 90 tablet 0   gabapentin (NEURONTIN) 300 MG capsule Plz take 379m in the morning , 600 mg in the afternoon and 9014mat bedtime. 180 capsule 1   LORazepam (ATIVAN) 0.5 MG tablet TAKE (1) TABLET BY MOUTH EVERY SIX HOURS AS NEEDED. 30 tablet 0   magic mouthwash w/lidocaine SOLN Take 5 mLs by mouth 3 (three) times daily as needed for mouth pain. Dispense 4003mf magic mouthwash compounded preparation. Patient to use 5ml70mtimes daily as needed for throat discomfort. 80 ml viscous lidocaine 2% 80 ml Mylanta 80 ml diphenhydramine at 12.5 mg per 5 ml elixir 80 ml nystatin at 100,000U per 5 mL suspension 80 ml distilled water     medroxyPROGESTERone (PROVERA) 10 MG tablet Take 10 mg by mouth daily.     metoprolol tartrate (LOPRESSOR) 50 MG tablet Take 1 tablet (50 mg total) by mouth 2 (two) times daily. 60 tablet 0   nicotine (NICODERM CQ - DOSED IN MG/24 HOURS) 21 mg/24hr patch Place 1 patch (21 mg total) onto the skin daily. 28 patch 0   ondansetron (ZOFRAN) 4 MG tablet Take 8 mg by mouth every 8 (eight) hours.     oxyCODONE (OXY IR/ROXICODONE) 5 MG immediate release tablet TAKE 1 OR 2 TABLETS BY MOUTH EVERY 6 HOURS AS NEEDED. 60 tablet 0   polyethylene glycol (MIRALAX / GLYCOLAX) 17 g packet Take 17 g by mouth daily as needed  for mild constipation.     potassium chloride SA (KLOR-CON) 20 MEQ tablet TAKE (1) TABLET BY MOUTH TWICE DAILY. 60 tablet 0   prochlorperazine (COMPAZINE) 10 MG tablet Take 10 mg by mouth every 6 (six) hours as needed for nausea or vomiting.     No current facility-administered medications for this visit.    REVIEW OF SYSTEMS:   .10 Point review of Systems was done is negative except as noted above.   PHYSICAL EXAMINATION: ECOG PERFORMANCE STATUS: 2 - Symptomatic, <50% confined to bed .BP 121/64    Pulse 87    Temp 98.1 F (36.7 C)    Resp 20    Wt 248 lb  11.2 oz (112.8 kg)    SpO2 98%    BMI 42.69 kg/m  . Marland Kitchen GENERAL:alert, in no acute distress and comfortable SKIN: no acute rashes, no significant lesions EYES: conjunctiva are pink and non-injected, sclera anicteric OROPHARYNX: MMM, no exudates, no oropharyngeal erythema or ulceration NECK: supple, no JVD LYMPH:  no palpable lymphadenopathy in the cervical, axillary or inguinal regions LUNGS: clear to auscultation b/l with normal respiratory effort HEART: regular rate & rhythm ABDOMEN:  normoactive bowel sounds , non tender, not distended. Extremity: trace pedal edema PSYCH: alert & oriented x 3 with fluent speech NEURO: no focal motor/sensory deficits     LABORATORY DATA:  I have reviewed the data as listed  .Marland Kitchen CBC Latest Ref Rng & Units 01/03/2021 12/06/2020 11/04/2020  WBC 4.0 - 10.5 K/uL 8.0 10.6(H) 5.1  Hemoglobin 12.0 - 15.0 g/dL 11.5(L) 10.9(L) 10.5(L)  Hematocrit 36.0 - 46.0 % 34.9(L) 34.4(L) 32.0(L)  Platelets 150 - 400 K/uL 286 280 104(L)    . CMP Latest Ref Rng & Units 01/03/2021 12/06/2020 11/04/2020  Glucose 70 - 99 mg/dL 110(H) 122(H) 109(H)  BUN 6 - 20 mg/dL _0 Creatinine 0.44 - 1.00 mg/dL 0.85 0.80 0.79  Sodium 135 - 145 mmol/L 142 139 137  Potassium 3.5 - 5.1 mmol/L 3.9 3.4(L) 4.1  Chloride 98 - 111 mmol/L 105 105 105  CO2 22 - 32 mmol/L _1 Calcium 8.9 - 10.3 mg/dL 10.1 9.5 9.6  Total  Protein 6.5 - 8.1 g/dL 6.8 7.2 6.7  Total Bilirubin 0.3 - 1.2 mg/dL 0.3 0.4 0.4  Alkaline Phos 38 - 126 U/L 79 73 74  AST 15 - 41 U/L _2 ALT 0 - 44 U/L 32 36 49(H)      RADIOGRAPHIC STUDIES: I have personally reviewed the radiological images as listed and agreed with the findings in the report. No results found.   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  2) h/o hypercalcemia due to multiple myeloma-now resolved with IV fluids, calcitonin, pamidronate. 3) h/o anemia due to multiple myeloma becoming more apparent as the patient's hemoconcentration due to dehydration has been corrected.   4) h/o acute renal failure related to dehydration hypercalcemia and multiple myeloma.  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  PLAN: -Recently completed evaluation at [facility] was reviewed with her in detail. -PET CT scan done 01/28/2021 showed no evidence of hypermetabolic osseous or extraosseous disease. Bone marrow biopsy showed sCR and MRD negative status. She was recommended to start on maintenance Revlimid and to continue her bisphosphonates.  -I had a detailed discussion with her about pros and cons of maintenance Revlimid and she is willing to proceed.  Will be started on 10 mg 3 weeks on 1 week off with continued Eliquis for  thromboprophylaxis. -Continue current dose of gabapentin -Given prescription for gabapentin with dose adjustment . -  Discussed in detail her plan for pain management after taking the due to pain history.  Refilled her fentanyl patch and as needed oxycodone. -She will try to minimize her as needed oxycodone use and we shall plan to try to wean down her fentanyl patch dose progressively. -Continue physical therapy -We will continue Zometa every 2 months for the first year and then once every 3 months for maintenance. -On Eliquis for her DVT and now for thromboprophylaxis with Revlimid.  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 Evusheld given 11/12/2020.  She will begin COVID series and get flu vaccine next week.   Routine post transplant immunizations to begin at day +180.   FOLLOW UP: Labs and starting Revlimid in about 2-3 weeks RTC with Dr Irene Limbo with labs in 2 months Will need to set up post transplant vaccinations D+180  All of the patients questions were answered with apparent satisfaction. The patient knows to call the clinic with any problems, questions or concerns.  Total time spent 35 minutes reviewing her outside transplant team recommendations with her, discussing PET CT scan bone marrow biopsy results, discussing ordering and coordination of maintenance treatment and Zometa and documentation    Sullivan Lone MD [place] AAHIVMS [facility] Hematology/Oncology Physician [facility].Marland Kitchen

## 2021-03-10 ENCOUNTER — Encounter: Payer: Self-pay | Admitting: Hematology

## 2021-03-10 MED ORDER — LENALIDOMIDE 10 MG PO CAPS: 10.0000 mg | ORAL_CAPSULE | Freq: Every day | ORAL | 3 refills | Status: DC

## 2021-03-10 NOTE — Addendum Note (Signed)
Addended by: Sullivan Lone on: 03/10/2021 07:00 AM   Modules accepted: Orders

## 2021-03-11 ENCOUNTER — Other Ambulatory Visit: Payer: Self-pay | Admitting: Hematology

## 2021-03-12 ENCOUNTER — Telehealth: Payer: Self-pay

## 2021-03-12 ENCOUNTER — Telehealth: Payer: Self-pay | Admitting: Pharmacist

## 2021-03-12 ENCOUNTER — Encounter: Payer: Self-pay | Admitting: Hematology

## 2021-03-12 ENCOUNTER — Other Ambulatory Visit (HOSPITAL_COMMUNITY): Payer: Self-pay

## 2021-03-12 NOTE — Telephone Encounter (Signed)
[name] Encounter   Received notification from [employer] that prior authorization for Revlimid is required.   PA submitted on CoverMyMeds Key [id number] Status is pending   Oral Oncology Clinic will continue to follow.  [place] [facility] [place] Phone [phone] Fax [phone] 03/12/2021 1:35 PM

## 2021-03-12 NOTE — Telephone Encounter (Addendum)
Oral Oncology Pharmacist Encounter  Received new prescription for Revlimid (lenalidomide) for the maintenance treatment of multiple myeloma, s/p  autoSCT 10/15/20, planned duration until disease progression or unacceptable drug toxicity.  CBC w/ Diff and CMP from 01/29/20 assessed, no baseline dose adjustments required. Prescription dose and frequency assessed for appropriateness. Appropriate for therapy initiation.   Current medication list in Epic reviewed, no relevant/significant DDIs with Revlimid identified.  Evaluated chart and no patient barriers to medication adherence noted.   Patient agreement for treatment documented in MD note on 02/28/21.  Patient's insurance requires Revlimid to be filled through [employer]   Oral Oncology Clinic will continue to follow for insurance authorization, copayment issues, initial counseling and start date.  Leron Croak, PharmD, BCPS Hematology/Oncology Clinical Pharmacist [facility] [phone] 03/12/2021 12:50 PM

## 2021-03-12 NOTE — Telephone Encounter (Signed)
[name] Encounter  Prior Authorization for Revlimid has been approved.    PA# [id number] Effective dates: 03/12/21 through 03/12/22  Oral Oncology Clinic will continue to follow.   Stacey Montoya Phone [phone] Fax [phone] 03/12/2021 2:02 PM

## 2021-03-12 NOTE — Telephone Encounter (Signed)
[name] Encounter  Prior Authorization for Revlimid has been approved.    PA# [id number] Effective dates: 03/12/21 through 03/12/22  Oral Oncology Clinic will continue to follow.   [name] Stacey Montoya Phone [phone] Fax [phone] 03/12/2021 2:00 PM

## 2021-03-13 ENCOUNTER — Other Ambulatory Visit: Payer: Self-pay

## 2021-03-13 DIAGNOSIS — C9001 Multiple myeloma in remission: Secondary | ICD-10-CM

## 2021-03-13 NOTE — Telephone Encounter (Signed)
New start Revlimid form filled out online with pt on the phone. Pt authorized me to fill form out. Per REMS guidelines pt needs to have 2 urine pregnancy tests 10 to 14 days apart prior to starting Revlimid and then weekly the first month on Revlimid. Pt aware and verbalizes understanding of education done today.

## 2021-03-19 ENCOUNTER — Other Ambulatory Visit: Payer: Self-pay

## 2021-03-19 ENCOUNTER — Inpatient Hospital Stay: Payer: 59 | Attending: Hematology

## 2021-03-19 DIAGNOSIS — C9001 Multiple myeloma in remission: Secondary | ICD-10-CM

## 2021-03-19 DIAGNOSIS — C9000 Multiple myeloma not having achieved remission: Secondary | ICD-10-CM | POA: Diagnosis not present

## 2021-03-19 LAB — CBC WITH DIFFERENTIAL (CANCER CENTER ONLY)
Abs Immature Granulocytes: 0.02 10*3/uL (ref 0.00–0.07)
Basophils Absolute: 0 10*3/uL (ref 0.0–0.1)
Basophils Relative: 0 %
Eosinophils Absolute: 0.2 10*3/uL (ref 0.0–0.5)
Eosinophils Relative: 3 %
HCT: 38.8 % (ref 36.0–46.0)
Hemoglobin: 12.4 g/dL (ref 12.0–15.0)
Immature Granulocytes: 0 %
Lymphocytes Relative: 11 %
Lymphs Abs: 0.9 10*3/uL (ref 0.7–4.0)
MCH: 30.3 pg (ref 26.0–34.0)
MCHC: 32 g/dL (ref 30.0–36.0)
MCV: 94.9 fL (ref 80.0–100.0)
Monocytes Absolute: 0.5 10*3/uL (ref 0.1–1.0)
Monocytes Relative: 7 %
Neutro Abs: 6.2 10*3/uL (ref 1.7–7.7)
Neutrophils Relative %: 79 %
Platelet Count: 301 10*3/uL (ref 150–400)
RBC: 4.09 MIL/uL (ref 3.87–5.11)
RDW: 15.8 % — ABNORMAL HIGH (ref 11.5–15.5)
WBC Count: 7.9 10*3/uL (ref 4.0–10.5)
nRBC: 0 % (ref 0.0–0.2)

## 2021-03-19 LAB — CMP (CANCER CENTER ONLY)
ALT: 35 U/L (ref 0–44)
AST: 20 U/L (ref 15–41)
Albumin: 4.5 g/dL (ref 3.5–5.0)
Alkaline Phosphatase: 82 U/L (ref 38–126)
Anion gap: 9 (ref 5–15)
BUN: 15 mg/dL (ref 6–20)
CO2: 27 mmol/L (ref 22–32)
Calcium: 10.1 mg/dL (ref 8.9–10.3)
Chloride: 102 mmol/L (ref 98–111)
Creatinine: 0.95 mg/dL (ref 0.44–1.00)
GFR, Estimated: >60 mL/min (ref >60)
Glucose, Bld: 115 mg/dL — ABNORMAL HIGH (ref 70–99)
Potassium: 3.9 mmol/L (ref 3.5–5.1)
Sodium: 138 mmol/L (ref 135–145)
Total Bilirubin: 0.5 mg/dL (ref 0.3–1.2)
Total Protein: 7.4 g/dL (ref 6.5–8.1)

## 2021-03-19 LAB — MAGNESIUM: Magnesium: 1.9 mg/dL (ref 1.7–2.4)

## 2021-03-19 LAB — PREGNANCY, URINE: Preg Test, Ur: NEGATIVE

## 2021-03-20 ENCOUNTER — Other Ambulatory Visit: Payer: Self-pay

## 2021-03-20 DIAGNOSIS — C9001 Multiple myeloma in remission: Secondary | ICD-10-CM

## 2021-03-20 MED ORDER — FENTANYL 25 MCG/HR TD PT72: 1.0000 | MEDICATED_PATCH | TRANSDERMAL | 0 refills | Status: DC

## 2021-03-24 ENCOUNTER — Other Ambulatory Visit: Payer: Self-pay

## 2021-03-24 DIAGNOSIS — C9001 Multiple myeloma in remission: Secondary | ICD-10-CM

## 2021-03-26 ENCOUNTER — Other Ambulatory Visit: Payer: Self-pay

## 2021-03-26 ENCOUNTER — Inpatient Hospital Stay: Payer: 59

## 2021-03-26 DIAGNOSIS — C9001 Multiple myeloma in remission: Secondary | ICD-10-CM

## 2021-03-26 DIAGNOSIS — C9000 Multiple myeloma not having achieved remission: Secondary | ICD-10-CM | POA: Diagnosis not present

## 2021-03-26 LAB — PREGNANCY, URINE: Preg Test, Ur: NEGATIVE

## 2021-03-31 ENCOUNTER — Other Ambulatory Visit: Payer: Self-pay | Admitting: Hematology

## 2021-03-31 DIAGNOSIS — C7951 Secondary malignant neoplasm of bone: Secondary | ICD-10-CM

## 2021-03-31 DIAGNOSIS — C9000 Multiple myeloma not having achieved remission: Secondary | ICD-10-CM

## 2021-03-31 DIAGNOSIS — R609 Edema, unspecified: Secondary | ICD-10-CM

## 2021-03-31 DIAGNOSIS — C9001 Multiple myeloma in remission: Secondary | ICD-10-CM

## 2021-04-02 ENCOUNTER — Inpatient Hospital Stay: Payer: 59

## 2021-04-02 ENCOUNTER — Other Ambulatory Visit: Payer: Self-pay

## 2021-04-02 DIAGNOSIS — C9001 Multiple myeloma in remission: Secondary | ICD-10-CM

## 2021-04-02 DIAGNOSIS — C9000 Multiple myeloma not having achieved remission: Secondary | ICD-10-CM

## 2021-04-02 LAB — CBC WITH DIFFERENTIAL (CANCER CENTER ONLY)
Abs Immature Granulocytes: 0.03 10*3/uL (ref 0.00–0.07)
Basophils Absolute: 0 10*3/uL (ref 0.0–0.1)
Basophils Relative: 0 %
Eosinophils Absolute: 0.2 10*3/uL (ref 0.0–0.5)
Eosinophils Relative: 3 %
HCT: 38 % (ref 36.0–46.0)
Hemoglobin: 12.3 g/dL (ref 12.0–15.0)
Immature Granulocytes: 0 %
Lymphocytes Relative: 11 %
Lymphs Abs: 0.9 10*3/uL (ref 0.7–4.0)
MCH: 30.5 pg (ref 26.0–34.0)
MCHC: 32.4 g/dL (ref 30.0–36.0)
MCV: 94.3 fL (ref 80.0–100.0)
Monocytes Absolute: 0.5 10*3/uL (ref 0.1–1.0)
Monocytes Relative: 6 %
Neutro Abs: 6.7 10*3/uL (ref 1.7–7.7)
Neutrophils Relative %: 80 %
Platelet Count: 300 10*3/uL (ref 150–400)
RBC: 4.03 MIL/uL (ref 3.87–5.11)
RDW: 16.3 % — ABNORMAL HIGH (ref 11.5–15.5)
WBC Count: 8.4 10*3/uL (ref 4.0–10.5)
nRBC: 0 % (ref 0.0–0.2)

## 2021-04-02 LAB — CMP (CANCER CENTER ONLY)
ALT: 26 U/L (ref 0–44)
AST: 20 U/L (ref 15–41)
Albumin: 4.5 g/dL (ref 3.5–5.0)
Alkaline Phosphatase: 74 U/L (ref 38–126)
Anion gap: 9 (ref 5–15)
BUN: 13 mg/dL (ref 6–20)
CO2: 25 mmol/L (ref 22–32)
Calcium: 9.8 mg/dL (ref 8.9–10.3)
Chloride: 104 mmol/L (ref 98–111)
Creatinine: 0.91 mg/dL (ref 0.44–1.00)
GFR, Estimated: >60 mL/min (ref >60)
Glucose, Bld: 112 mg/dL — ABNORMAL HIGH (ref 70–99)
Potassium: 4 mmol/L (ref 3.5–5.1)
Sodium: 138 mmol/L (ref 135–145)
Total Bilirubin: 0.6 mg/dL (ref 0.3–1.2)
Total Protein: 7.3 g/dL (ref 6.5–8.1)

## 2021-04-02 LAB — PREGNANCY, URINE: Preg Test, Ur: NEGATIVE

## 2021-04-02 LAB — MAGNESIUM: Magnesium: 1.9 mg/dL (ref 1.7–2.4)

## 2021-04-09 ENCOUNTER — Other Ambulatory Visit: Payer: Self-pay

## 2021-04-09 ENCOUNTER — Inpatient Hospital Stay: Payer: 59

## 2021-04-09 DIAGNOSIS — C9001 Multiple myeloma in remission: Secondary | ICD-10-CM

## 2021-04-09 DIAGNOSIS — C9000 Multiple myeloma not having achieved remission: Secondary | ICD-10-CM | POA: Diagnosis not present

## 2021-04-09 LAB — PREGNANCY, URINE: Preg Test, Ur: NEGATIVE

## 2021-04-09 MED ORDER — LENALIDOMIDE 10 MG PO CAPS: 10.0000 mg | ORAL_CAPSULE | Freq: Every day | ORAL | 3 refills | Status: DC

## 2021-04-10 ENCOUNTER — Other Ambulatory Visit: Payer: Self-pay

## 2021-04-10 DIAGNOSIS — C9001 Multiple myeloma in remission: Secondary | ICD-10-CM

## 2021-04-10 NOTE — Telephone Encounter (Signed)
Oral Chemotherapy Pharmacist Encounter  I spoke with patient for overview of: Revlimid for the maintenance treatment of multiple myeloma, planned duration until disease progression or unacceptable drug toxicity.  Counseled patient on administration, dosing, side effects, monitoring, drug-food interactions, safe handling, storage, and disposal.  Patient will take Revlimid 40m capsules, 1 capsule by mouth once daily, without regard to food, with a full glass of water.  Revlimid will be given 21 days on, 7 days off, repeat every 28 days.  Revlimid start date: 04/14/21  Adverse effects of Revlimid include but are not limited to: nausea, constipation, diarrhea, abdominal pain, rash, fatigue, and decreased blood counts.    Reviewed with patient importance of keeping a medication schedule and plan for any missed doses. No barriers to medication adherence identified.  Medication reconciliation performed and medication/allergy list updated. Patient requesting refill on compazine to use PRN if N/V is experienced. Message sent to Dr. KIrene Limboabout this.   Patient is on Eliquis 5 mg PO BID - she endorses compliance with this. This will also be used at [facility] while patient is on Revlimid.   Insurance authorization for Revlimid has been obtained.  Revlimid prescription is being dispensed from [facility] it is a limited distribution medication.  All questions answered.  Ms. HCarleyvoiced understanding and appreciation.   Medication education handout and medication calendar placed in mail for patient. Patient knows to call the office with questions or concerns. Oral Chemotherapy Clinic phone number provided to patient.   RLeron Croak PharmD, BCPS Hematology/Oncology Clinical Pharmacist [facility] [phone]/23/2023 1:24 PM

## 2021-04-10 NOTE — Telephone Encounter (Signed)
Oral Chemotherapy Pharmacist Encounter   Attempted to reach patient to provide update and offer for initial counseling on maintenance Revlimid (lenalidomide).   No answer. Left voicemail for patient to call back for initial counseling session. Notified by [facility] that Revlimid will be delivered to patient's home on 04/11/21. Patient had $0 copay.  Leron Croak, PharmD, BCPS Hematology/Oncology Clinical Pharmacist [facility] [phone] 04/10/2021 10:26 AM

## 2021-04-11 ENCOUNTER — Encounter: Payer: Self-pay | Admitting: Hematology

## 2021-04-11 MED ORDER — PROCHLORPERAZINE MALEATE 10 MG PO TABS: 10.0000 mg | ORAL_TABLET | Freq: Four times a day (QID) | ORAL | 2 refills | Status: DC | PRN

## 2021-04-18 DIAGNOSIS — R059 Cough, unspecified: Secondary | ICD-10-CM | POA: Insufficient documentation

## 2021-04-19 ENCOUNTER — Other Ambulatory Visit: Payer: Self-pay | Admitting: Hematology

## 2021-04-21 ENCOUNTER — Encounter: Payer: Self-pay | Admitting: Hematology

## 2021-04-24 DIAGNOSIS — K219 Gastro-esophageal reflux disease without esophagitis: Secondary | ICD-10-CM | POA: Insufficient documentation

## 2021-04-28 ENCOUNTER — Other Ambulatory Visit: Payer: Self-pay

## 2021-04-28 ENCOUNTER — Other Ambulatory Visit: Payer: Self-pay | Admitting: Hematology

## 2021-04-28 DIAGNOSIS — C9001 Multiple myeloma in remission: Secondary | ICD-10-CM

## 2021-04-29 ENCOUNTER — Other Ambulatory Visit (HOSPITAL_COMMUNITY): Payer: Self-pay

## 2021-04-29 ENCOUNTER — Inpatient Hospital Stay: Payer: 59

## 2021-04-29 ENCOUNTER — Inpatient Hospital Stay: Payer: 59 | Attending: Hematology | Admitting: Hematology

## 2021-04-29 ENCOUNTER — Other Ambulatory Visit: Payer: Self-pay

## 2021-04-29 DIAGNOSIS — C9001 Multiple myeloma in remission: Secondary | ICD-10-CM | POA: Diagnosis not present

## 2021-04-29 DIAGNOSIS — C9000 Multiple myeloma not having achieved remission: Secondary | ICD-10-CM | POA: Diagnosis present

## 2021-04-29 DIAGNOSIS — M8448XA Pathological fracture, other site, initial encounter for fracture: Secondary | ICD-10-CM | POA: Diagnosis not present

## 2021-04-29 DIAGNOSIS — Z800 Family history of malignant neoplasm of digestive organs: Secondary | ICD-10-CM | POA: Diagnosis not present

## 2021-04-29 DIAGNOSIS — Z7901 Long term (current) use of anticoagulants: Secondary | ICD-10-CM | POA: Insufficient documentation

## 2021-04-29 DIAGNOSIS — M7989 Other specified soft tissue disorders: Secondary | ICD-10-CM | POA: Diagnosis not present

## 2021-04-29 DIAGNOSIS — M549 Dorsalgia, unspecified: Secondary | ICD-10-CM | POA: Insufficient documentation

## 2021-04-29 DIAGNOSIS — C7951 Secondary malignant neoplasm of bone: Secondary | ICD-10-CM

## 2021-04-29 DIAGNOSIS — Z801 Family history of malignant neoplasm of trachea, bronchus and lung: Secondary | ICD-10-CM | POA: Insufficient documentation

## 2021-04-29 DIAGNOSIS — G629 Polyneuropathy, unspecified: Secondary | ICD-10-CM | POA: Diagnosis not present

## 2021-04-29 DIAGNOSIS — Z87891 Personal history of nicotine dependence: Secondary | ICD-10-CM | POA: Diagnosis not present

## 2021-04-29 DIAGNOSIS — G893 Neoplasm related pain (acute) (chronic): Secondary | ICD-10-CM | POA: Diagnosis not present

## 2021-04-29 LAB — CBC WITH DIFFERENTIAL (CANCER CENTER ONLY)
Abs Immature Granulocytes: 0.02 10*3/uL (ref 0.00–0.07)
Basophils Absolute: 0 10*3/uL (ref 0.0–0.1)
Basophils Relative: 0 %
Eosinophils Absolute: 0.4 10*3/uL (ref 0.0–0.5)
Eosinophils Relative: 5 %
HCT: 36.6 % (ref 36.0–46.0)
Hemoglobin: 11.6 g/dL — ABNORMAL LOW (ref 12.0–15.0)
Immature Granulocytes: 0 %
Lymphocytes Relative: 10 %
Lymphs Abs: 0.7 10*3/uL (ref 0.7–4.0)
MCH: 30 pg (ref 26.0–34.0)
MCHC: 31.7 g/dL (ref 30.0–36.0)
MCV: 94.6 fL (ref 80.0–100.0)
Monocytes Absolute: 0.7 10*3/uL (ref 0.1–1.0)
Monocytes Relative: 11 %
Neutro Abs: 4.9 10*3/uL (ref 1.7–7.7)
Neutrophils Relative %: 74 %
Platelet Count: 241 10*3/uL (ref 150–400)
RBC: 3.87 MIL/uL (ref 3.87–5.11)
RDW: 18.5 % — ABNORMAL HIGH (ref 11.5–15.5)
WBC Count: 6.7 10*3/uL (ref 4.0–10.5)
nRBC: 0 % (ref 0.0–0.2)

## 2021-04-29 LAB — PREGNANCY, URINE: Preg Test, Ur: NEGATIVE

## 2021-04-29 LAB — CMP (CANCER CENTER ONLY)
ALT: 33 U/L (ref 0–44)
AST: 16 U/L (ref 15–41)
Albumin: 3.8 g/dL (ref 3.5–5.0)
Alkaline Phosphatase: 63 U/L (ref 38–126)
Anion gap: 10 (ref 5–15)
BUN: 10 mg/dL (ref 6–20)
CO2: 26 mmol/L (ref 22–32)
Calcium: 8.7 mg/dL — ABNORMAL LOW (ref 8.9–10.3)
Chloride: 101 mmol/L (ref 98–111)
Creatinine: 0.79 mg/dL (ref 0.44–1.00)
GFR, Estimated: >60 mL/min (ref >60)
Glucose, Bld: 113 mg/dL — ABNORMAL HIGH (ref 70–99)
Potassium: 3.2 mmol/L — ABNORMAL LOW (ref 3.5–5.1)
Sodium: 137 mmol/L (ref 135–145)
Total Bilirubin: 0.4 mg/dL (ref 0.3–1.2)
Total Protein: 6.5 g/dL (ref 6.5–8.1)

## 2021-04-29 LAB — MAGNESIUM: Magnesium: 1.7 mg/dL (ref 1.7–2.4)

## 2021-04-29 MED ORDER — FENTANYL 25 MCG/HR TD PT72: 1.0000 | MEDICATED_PATCH | TRANSDERMAL | 0 refills | Status: DC

## 2021-04-29 MED ORDER — FENTANYL 25 MCG/HR TD PT72
1.0000 | MEDICATED_PATCH | TRANSDERMAL | 0 refills | Status: DC
  Filled 2021-04-29: qty 10, 30d supply, fill #0

## 2021-04-29 MED ORDER — GABAPENTIN 300 MG PO CAPS: 900.0000 mg | ORAL_CAPSULE | Freq: Every day | ORAL | 0 refills | Status: DC

## 2021-04-29 MED ORDER — OXYCODONE HCL 5 MG PO TABS
5.0000 mg | ORAL_TABLET | Freq: Four times a day (QID) | ORAL | 0 refills | Status: DC | PRN
Start: 2021-04-29 — End: 2021-05-27

## 2021-04-29 NOTE — Progress Notes (Signed)
? ? ?HEMATOLOGY/ONCOLOGY CLINIC NOTE ? ?Date of Service: 04/29/2021 ? ? ?Patient Care Team: ?Hall, John Z, MD as PCP - General (Internal Medicine) ?Wagner, Jaime, DO as Consulting Physician (Interventional Radiology) ? ?CHIEF COMPLAINTS/PURPOSE OF CONSULTATION:  ?Follow-up for continued evaluation and management of multiple myeloma ? ?HISTORY OF PRESENTING ILLNESS:  ?Please see previous note for HPI. ? ?INTERVAL HISTORY: ? ?Stacey Montoya is here with her mother for continued evaluation and management of her multiple myeloma. ?She is tolerating the Revlimid well. She expresses feeling some "restless leg" and some "creepy crawly" feelings. She reports her neuropathy is worst at night and expresses interest in reducing her dosage during the day. ? ?Patient notes that her back pain is consistent and has not improved significantly.  ?lower extremity swelling. 2+ bilaterally. ?Upper respiratory infection treated with doxycycline and Augmentin ?No other acute new symptoms. ? ?We will continue Revlimid and Eliquis as previously discussed. No new significant or prohibitive toxicities in association with revlimid at this time.  ? ?We discussed increasing foods rich in potassium.  ? ?She reports not being able to get COVID-19 and influenza vaccines due to recent PNA diagnosis. She took doxycycline, prednisone, and an inhaler to treat pneumonia. ? ?Patient expresses interest in taking COQ10 and we discussed that. ? ?She reports her last period as over one year ago. ? ?She expresses that she wants to work on her diet. ? ?We also discussed role of gradually weaning off of her narcotic pain medications. ? ?MEDICAL HISTORY:  ?Past Medical History:  ?Diagnosis Date  ? Abnormal Pap smear of cervix   ? Back pain   ? Cancer (HCC) 04/02/2020  ? multiple myeloma  ? Hyperlipidemia   ? Hypertension   ? ? ?SURGICAL HISTORY: ?Past Surgical History:  ?Procedure Laterality Date  ? CERVICAL CONIZATION W/BX  12/02/2010  ? Procedure:  CONIZATION CERVIX WITH BIOPSY;  Surgeon: Brook A Silva;  Location: [facility];  Service: Gynecology;  Laterality: N/A;  COLD KNIFE  ? CESAREAN SECTION    ? DILATION AND CURETTAGE OF UTERUS  12/02/2010  ? Procedure: DILATATION AND CURETTAGE (D&C);  Surgeon: Brook A Silva;  Location: [facility];  Service: Gynecology;  Laterality: N/A;  ? IR RADIOLOGIST EVAL & MGMT  04/16/2020  ? KYPHOPLASTY Bilateral 09/05/2020  ? Procedure: KYPHOPLASTY THORACIC TWELVE AND LUMBAR FOUR;  Surgeon: Nundkumar, Neelesh, MD;  Location: [facility];  Service: Neurosurgery;  Laterality: Bilateral;  ? left hand    ? drain - infection  ? WISDOM TOOTH EXTRACTION    ? ? ?SOCIAL HISTORY: ?Social History  ? ?Socioeconomic History  ? Marital status: Married  ?  Spouse name: Not on file  ? Number of children: Not on file  ? Years of education: Not on file  ? Highest education level: Not on file  ?Occupational History  ? Not on file  ?Tobacco Use  ? Smoking status: Former  ?  Packs/day: 0.50  ?  Years: 20.00  ?  Pack years: 10.00  ?  Types: Cigarettes  ? Smokeless tobacco: Never  ?Vaping Use  ? Vaping Use: Some days  ?Substance and Sexual Activity  ? Alcohol use: Not Currently  ? Drug use: Not Currently  ? Sexual activity: Not Currently  ?  Partners: Male  ?  Birth control/protection: Condom  ?Other Topics Concern  ? Not on file  ?Social History Narrative  ? ** Merged History Encounter **  ?    ? ?Social Determinants of Health  ? ?Financial   Resource Strain: Not on file  ?Food Insecurity: Not on file  ?Transportation Needs: Not on file  ?Physical Activity: Not on file  ?Stress: Not on file  ?Social Connections: Not on file  ?Intimate Partner Violence: Not on file  ? ? ?FAMILY HISTORY: ?Family History  ?Problem Relation Age of Onset  ? Lung cancer Maternal Grandfather   ? Pancreatic cancer Paternal Grandfather   ? ? ?ALLERGIES:  is allergic to zithromax [azithromycin]. ? ?MEDICATIONS:  ?Current Outpatient Medications  ?Medication Sig Dispense Refill  ? acyclovir  (ZOVIRAX) 400 MG tablet TAKE (1) TABLET BY MOUTH TWICE DAILY. 60 tablet 0  ? albuterol (VENTOLIN HFA) 108 (90 Base) MCG/ACT inhaler Inhale 1-2 puffs into the lungs every 6 (six) hours as needed for wheezing.    ? amLODipine (NORVASC) 5 MG tablet Take 1 tablet (5 mg total) by mouth daily. 30 tablet 0  ? b complex vitamins capsule Take 1 capsule by mouth daily. 30 capsule   ? dexamethasone (DECADRON) 4 MG tablet Take 5 tablets (20 mg total) by mouth See admin instructions. Take 20 mg on day 15 of each chemo cycle    ? ELIQUIS 5 MG TABS tablet TAKE 1 TABLET BY MOUTH TWICE DAILY. 60 tablet 0  ? ergocalciferol (VITAMIN D2) 1.25 MG (50000 UT) capsule Take 1 capsule (50,000 Units total) by mouth 2 (two) times a week. 12 capsule 2  ? fentaNYL (DURAGESIC) 25 MCG/HR APPLY 1 PATCH EVERY 72 HOURS FOR PAIN. REMOVE USED PATCH. 10 patch 0  ? folic acid (FOLVITE) 1 MG tablet TAKE 1 TABLET BY MOUTH ONCE A DAY. 90 tablet 0  ? furosemide (LASIX) 20 MG tablet TAKE 3 TABLETS BY MOUTH DAILY. 90 tablet 0  ? gabapentin (NEURONTIN) 300 MG capsule TAKE 1 CAPSULE IN THE MORNING, 1 IN THE AFTERNOON, AND TAKE 3 CAPSULES AT BEDTIME. 150 capsule 0  ? lenalidomide (REVLIMID) 10 MG capsule Take 1 capsule (10 mg total) by mouth daily. 21 capsule 3  ? LORazepam (ATIVAN) 0.5 MG tablet TAKE (1) TABLET BY MOUTH EVERY SIX HOURS AS NEEDED. 30 tablet 0  ? magic mouthwash w/lidocaine SOLN Take 5 mLs by mouth 3 (three) times daily as needed for mouth pain. Dispense 400ml of magic mouthwash compounded preparation. Patient to use 5ml 4 times daily as needed for throat discomfort. ?80 ml viscous lidocaine 2% ?80 ml Mylanta ?80 ml diphenhydramine at 12.5 mg per 5 ml elixir ?80 ml nystatin at 100,000U per 5 mL suspension ?80 ml distilled water    ? medroxyPROGESTERone (PROVERA) 10 MG tablet Take 10 mg by mouth daily.    ? metoprolol tartrate (LOPRESSOR) 50 MG tablet Take 1 tablet (50 mg total) by mouth 2 (two) times daily. 60 tablet 0  ? nicotine (NICODERM CQ -  DOSED IN MG/24 HOURS) 21 mg/24hr patch Place 1 patch (21 mg total) onto the skin daily. 28 patch 0  ? ondansetron (ZOFRAN) 4 MG tablet Take 8 mg by mouth every 8 (eight) hours.    ? oxyCODONE (OXY IR/ROXICODONE) 5 MG immediate release tablet TAKE 1 OR 2 TABLETS BY MOUTH EVERY 6 HOURS AS NEEDED. 60 tablet 0  ? polyethylene glycol (MIRALAX / GLYCOLAX) 17 g packet Take 17 g by mouth daily as needed for mild constipation.    ? potassium chloride SA (KLOR-CON M) 20 MEQ tablet TAKE (1) TABLET BY MOUTH TWICE DAILY. 60 tablet 0  ? prochlorperazine (COMPAZINE) 10 MG tablet Take 1 tablet (10 mg total) by mouth every 6 (  six) hours as needed for nausea or vomiting. 30 tablet 2  ? ?No current facility-administered medications for this visit.  ? ? ?REVIEW OF SYSTEMS:   ?.10 Point review of Systems was done is negative except as noted above. ? ? ?PHYSICAL EXAMINATION: ?ECOG PERFORMANCE STATUS: 2 - Symptomatic, <50% confined to bed ?.BP 125/67 (BP Location: Left Arm, Patient Position: Sitting)   Pulse 90   Temp 97.6 ?F (36.4 ?C) (Temporal)   Resp 19   Ht 5' 4" (1.626 m)   Wt 253 lb 9.6 oz (115 kg)   SpO2 100%   BMI 43.53 kg/m?  ?. ?. ?GENERAL:alert, in no acute distress and comfortable ?SKIN: no acute rashes, no significant lesions ?EYES: conjunctiva are pink and non-injected, sclera anicteric ?NECK: supple, no JVD ?LYMPH:  no palpable lymphadenopathy in the cervical, axillary or inguinal regions ?LUNGS: clear to auscultation b/l with normal respiratory effort ?HEART: regular rate & rhythm ?ABDOMEN:  normoactive bowel sounds, and not distended.  ?Extremity: no pedal edema ?PSYCH: alert & oriented x 3 with fluent speech ?NEURO: no focal motor/sensory deficits ? ? ? ? ?LABORATORY DATA:  ?I have reviewed the data as listed ? ?.. ?CBC Latest Ref Rng & Units 04/29/2021 04/02/2021 03/19/2021  ?WBC 4.0 - 10.5 K/uL 6.7 8.4 7.9  ?Hemoglobin 12.0 - 15.0 g/dL 11.6(L) 12.3 12.4  ?Hematocrit 36.0 - 46.0 % 36.6 38.0 38.8  ?Platelets 150 - 400  K/uL 241 300 301  ? ? ?. ?CMP Latest Ref Rng & Units 04/29/2021 04/02/2021 03/19/2021  ?Glucose 70 - 99 mg/dL 113(H) 112(H) 115(H)  ?BUN 6 - 20 mg/dL 10 13 15  ?Creatinine 0.44 - 1.00 mg/dL 0.79 0.91 0.95  ?Sodium 135 - 145 mm

## 2021-04-30 ENCOUNTER — Other Ambulatory Visit (HOSPITAL_COMMUNITY): Payer: Self-pay

## 2021-05-02 ENCOUNTER — Other Ambulatory Visit: Payer: Self-pay

## 2021-05-02 DIAGNOSIS — C9001 Multiple myeloma in remission: Secondary | ICD-10-CM

## 2021-05-02 LAB — MULTIPLE MYELOMA PANEL, SERUM
Albumin SerPl Elph-Mcnc: 3.6 g/dL (ref 2.9–4.4)
Albumin/Glob SerPl: 1.5 (ref 0.7–1.7)
Alpha 1: 0.3 g/dL (ref 0.0–0.4)
Alpha2 Glob SerPl Elph-Mcnc: 0.8 g/dL (ref 0.4–1.0)
B-Globulin SerPl Elph-Mcnc: 1 g/dL (ref 0.7–1.3)
Gamma Glob SerPl Elph-Mcnc: 0.5 g/dL (ref 0.4–1.8)
Globulin, Total: 2.5 g/dL (ref 2.2–3.9)
IgA: 80 mg/dL — ABNORMAL LOW (ref 87–352)
IgG (Immunoglobin G), Serum: 636 mg/dL (ref 586–1602)
IgM (Immunoglobulin M), Srm: 46 mg/dL (ref 26–217)
Total Protein ELP: 6.1 g/dL (ref 6.0–8.5)

## 2021-05-02 MED ORDER — LENALIDOMIDE 10 MG PO CAPS: 10.0000 mg | ORAL_CAPSULE | Freq: Every day | ORAL | 3 refills | Status: DC

## 2021-05-06 ENCOUNTER — Other Ambulatory Visit: Payer: Self-pay | Admitting: Hematology

## 2021-05-06 ENCOUNTER — Encounter: Payer: Self-pay | Admitting: Hematology

## 2021-05-06 DIAGNOSIS — C9000 Multiple myeloma not having achieved remission: Secondary | ICD-10-CM

## 2021-05-06 DIAGNOSIS — R609 Edema, unspecified: Secondary | ICD-10-CM

## 2021-05-06 DIAGNOSIS — C7951 Secondary malignant neoplasm of bone: Secondary | ICD-10-CM

## 2021-05-08 ENCOUNTER — Telehealth: Payer: Self-pay | Admitting: Hematology

## 2021-05-08 NOTE — Telephone Encounter (Signed)
.  Called patient to schedule appointment per 3/23 inbasket, patient is aware of date and time.   ?

## 2021-05-12 ENCOUNTER — Other Ambulatory Visit: Payer: Self-pay | Admitting: Hematology

## 2021-05-18 ENCOUNTER — Encounter: Payer: Self-pay | Admitting: Hematology

## 2021-05-23 ENCOUNTER — Other Ambulatory Visit: Payer: Self-pay | Admitting: Hematology

## 2021-05-27 ENCOUNTER — Other Ambulatory Visit: Payer: Self-pay

## 2021-05-27 ENCOUNTER — Inpatient Hospital Stay: Payer: 59 | Attending: Hematology

## 2021-05-27 ENCOUNTER — Inpatient Hospital Stay: Payer: 59

## 2021-05-27 ENCOUNTER — Other Ambulatory Visit: Payer: Self-pay | Admitting: Hematology

## 2021-05-27 VITALS — BP 131/78 | HR 89 | Temp 98.2°F | Resp 20 | Ht 64.0 in | Wt 247.2 lb

## 2021-05-27 DIAGNOSIS — Z7189 Other specified counseling: Secondary | ICD-10-CM

## 2021-05-27 DIAGNOSIS — C9001 Multiple myeloma in remission: Secondary | ICD-10-CM

## 2021-05-27 DIAGNOSIS — Z23 Encounter for immunization: Secondary | ICD-10-CM | POA: Diagnosis present

## 2021-05-27 DIAGNOSIS — C9000 Multiple myeloma not having achieved remission: Secondary | ICD-10-CM | POA: Insufficient documentation

## 2021-05-27 DIAGNOSIS — C7951 Secondary malignant neoplasm of bone: Secondary | ICD-10-CM

## 2021-05-27 LAB — CMP (CANCER CENTER ONLY)
ALT: 37 U/L (ref 0–44)
AST: 21 U/L (ref 15–41)
Albumin: 4.3 g/dL (ref 3.5–5.0)
Alkaline Phosphatase: 55 U/L (ref 38–126)
Anion gap: 8 (ref 5–15)
BUN: 15 mg/dL (ref 6–20)
CO2: 25 mmol/L (ref 22–32)
Calcium: 9.5 mg/dL (ref 8.9–10.3)
Chloride: 104 mmol/L (ref 98–111)
Creatinine: 0.97 mg/dL (ref 0.44–1.00)
GFR, Estimated: >60 mL/min (ref >60)
Glucose, Bld: 112 mg/dL — ABNORMAL HIGH (ref 70–99)
Potassium: 3.8 mmol/L (ref 3.5–5.1)
Sodium: 137 mmol/L (ref 135–145)
Total Bilirubin: 0.5 mg/dL (ref 0.3–1.2)
Total Protein: 7.4 g/dL (ref 6.5–8.1)

## 2021-05-27 LAB — CBC WITH DIFFERENTIAL (CANCER CENTER ONLY)
Abs Immature Granulocytes: 0.01 10*3/uL (ref 0.00–0.07)
Basophils Absolute: 0.1 10*3/uL (ref 0.0–0.1)
Basophils Relative: 1 %
Eosinophils Absolute: 0.6 10*3/uL — ABNORMAL HIGH (ref 0.0–0.5)
Eosinophils Relative: 9 %
HCT: 39.8 % (ref 36.0–46.0)
Hemoglobin: 12.7 g/dL (ref 12.0–15.0)
Immature Granulocytes: 0 %
Lymphocytes Relative: 11 %
Lymphs Abs: 0.7 10*3/uL (ref 0.7–4.0)
MCH: 30.3 pg (ref 26.0–34.0)
MCHC: 31.9 g/dL (ref 30.0–36.0)
MCV: 95 fL (ref 80.0–100.0)
Monocytes Absolute: 0.6 10*3/uL (ref 0.1–1.0)
Monocytes Relative: 9 %
Neutro Abs: 4.7 10*3/uL (ref 1.7–7.7)
Neutrophils Relative %: 70 %
Platelet Count: 226 10*3/uL (ref 150–400)
RBC: 4.19 MIL/uL (ref 3.87–5.11)
RDW: 16.8 % — ABNORMAL HIGH (ref 11.5–15.5)
WBC Count: 6.7 10*3/uL (ref 4.0–10.5)
nRBC: 0 % (ref 0.0–0.2)

## 2021-05-27 LAB — PREGNANCY, URINE: Preg Test, Ur: NEGATIVE

## 2021-05-27 LAB — MAGNESIUM: Magnesium: 1.9 mg/dL (ref 1.7–2.4)

## 2021-05-27 MED ORDER — PNEUMOCOCCAL 13-VAL CONJ VACC IM SUSP
0.5000 mL | Freq: Once | INTRAMUSCULAR | Status: AC
  Administered 2021-05-27: 0.5 mL via INTRAMUSCULAR
  Filled 2021-05-27: qty 0.5

## 2021-05-27 MED ORDER — HEPATITIS B VAC RECOMBINANT 20 MCG/ML IJ SUSP
2.0000 mL | Freq: Once | INTRAMUSCULAR | Status: AC
  Administered 2021-05-27: 40 ug via INTRAMUSCULAR
  Filled 2021-05-27: qty 2

## 2021-05-27 MED ORDER — ZOLEDRONIC ACID 4 MG/100ML IV SOLN
4.0000 mg | Freq: Once | INTRAVENOUS | Status: AC
  Administered 2021-05-27: 4 mg via INTRAVENOUS
  Filled 2021-05-27: qty 100

## 2021-05-27 MED ORDER — HEPATITIS B VAC RECOMBINANT 20 MCG/ML IJ SUSP: 1.0000 mL | Freq: Once | INTRAMUSCULAR | Status: DC

## 2021-05-27 MED ORDER — SODIUM CHLORIDE 0.9 % IV SOLN: Freq: Once | INTRAVENOUS | Status: AC

## 2021-05-27 MED ORDER — HEPATITIS B VAC RECOMBINANT 20 MCG/ML IJ SUSP
2.0000 mL | Freq: Once | INTRAMUSCULAR | Status: DC
  Filled 2021-05-27: qty 2

## 2021-05-27 MED ORDER — DTAP-IPV-HIB VACCINE IM SUSR
0.5000 mL | Freq: Once | INTRAMUSCULAR | Status: AC
  Administered 2021-05-27: 0.5 mL via INTRAMUSCULAR
  Filled 2021-05-27: qty 1

## 2021-05-27 NOTE — Patient Instructions (Signed)
[facility]  Discharge Instructions: ?Thank you for choosing [facility] to provide your oncology and hematology care.  ? ?If you have a lab appointment with the [facility], please go directly to the [facility] and check in at the registration area. ?  ?Wear comfortable clothing and clothing appropriate for easy access to any Portacath or PICC line.  ? ?We strive to give you quality time with your provider. You may need to reschedule your appointment if you arrive late (15 or more minutes).  Arriving late affects you and other patients whose appointments are after yours.  Also, if you miss three or more appointments without notifying the office, you may be dismissed from the clinic at the provider?s discretion.    ?  ?For prescription refill requests, have your pharmacy contact our office and allow 72 hours for refills to be completed.   ? ?Today you received the following chemotherapy and/or immunotherapy agent: Zometa    ?  ?To help prevent nausea and vomiting after your treatment, we encourage you to take your nausea medication as directed. ? ?BELOW ARE SYMPTOMS THAT SHOULD BE REPORTED IMMEDIATELY: ?*FEVER GREATER THAN 100.4 F (38 ?C) OR HIGHER ?*CHILLS OR SWEATING ?*NAUSEA AND VOMITING THAT IS NOT CONTROLLED WITH YOUR NAUSEA MEDICATION ?*UNUSUAL SHORTNESS OF BREATH ?*UNUSUAL BRUISING OR BLEEDING ?*URINARY PROBLEMS (pain or burning when urinating, or frequent urination) ?*BOWEL PROBLEMS (unusual diarrhea, constipation, pain near the anus) ?TENDERNESS IN MOUTH AND THROAT WITH OR WITHOUT PRESENCE OF ULCERS (sore throat, sores in mouth, or a toothache) ?UNUSUAL RASH, SWELLING OR PAIN  ?UNUSUAL VAGINAL DISCHARGE OR ITCHING  ? ?Items with * indicate a potential emergency and should be followed up as soon as possible or go to the Emergency Department if any problems should occur. ? ?Please show the CHEMOTHERAPY ALERT CARD or IMMUNOTHERAPY ALERT CARD at check-in to the  Emergency Department and triage nurse. ? ?Should you have questions after your visit or need to cancel or reschedule your appointment, please contact [facility]  Dept: [phone]  and follow the prompts.  Office hours are 8:00 a.m. to 4:30 p.m. Monday - Friday. Please note that voicemails left after 4:00 p.m. may not be returned until the following business day.  We are closed weekends and major holidays. You have access to a nurse at all times for urgent questions. Please call the main number to the clinic Dept: [phone] and follow the prompts. ? ? ?For any non-urgent questions, you may also contact your provider using MyChart. We now offer e-Visits for anyone 28 and older to request care online for non-urgent symptoms. For details visit [email or web address]. ?  ?Also download the MyChart app! Go to the app store, search "MyChart", open the app, select [facility], and log in with your MyChart username and password. ? ?Due to Covid, a mask is required upon entering the hospital/clinic. If you do not have a mask, one will be given to you upon arrival. For doctor visits, patients may have 1 support person aged 11 or older with them. For treatment visits, patients cannot have anyone with them due to current Covid guidelines and our immunocompromised population.  ? ?Tdap (Tetanus, Diphtheria, Pertussis) Vaccine: What You Need to Know ?1. Why get vaccinated? ?Tdap vaccine can prevent tetanus, diphtheria, and pertussis. ?Diphtheria and pertussis spread from person to person. Tetanus enters the body through cuts or wounds. ?TETANUS (T) causes painful stiffening of the muscles. Tetanus can lead  to serious health problems, including being unable to open the mouth, having trouble swallowing and breathing, or death. ?DIPHTHERIA (D) can lead to difficulty breathing, heart failure, paralysis, or death. ?PERTUSSIS (aP), also known as "whooping cough," can cause uncontrollable, violent  coughing that makes it hard to breathe, eat, or drink. Pertussis can be extremely serious especially in babies and young children, causing pneumonia, convulsions, brain damage, or death. In teens and adults, it can cause weight loss, loss of bladder control, passing out, and rib fractures from severe coughing. ?2. Tdap vaccine ?Tdap is only for children 7 years and older, adolescents, and adults.  ?Adolescents should receive a single dose of Tdap, preferably at age 48 or 41 years. ?Pregnant people should get a dose of Tdap during every pregnancy, preferably during the early part of the third trimester, to help protect the newborn from pertussis. Infants are most at risk for severe, life-threatening complications from pertussis. ?Adults who have never received Tdap should get a dose of Tdap. ?Also, adults should receive a booster dose of either Tdap or Td (a different vaccine that protects against tetanus and diphtheria but not pertussis) every 10 years, or after 5 years in the case of a severe or dirty wound or burn. ?Tdap may be given at the same time as other vaccines. ?3. Talk with your health care provider ?Tell your vaccine provider if the person getting the vaccine: ?Has had an allergic reaction after a previous dose of any vaccine that protects against tetanus, diphtheria, or pertussis, or has any severe, life-threatening allergies ?Has had a coma, decreased level of consciousness, or prolonged seizures within 7 days after a previous dose of any pertussis vaccine (DTP, DTaP, or Tdap) ?Has seizures or another nervous system problem ?Has ever had Guillain-Barr? Syndrome (also called "GBS") ?Has had severe pain or swelling after a previous dose of any vaccine that protects against tetanus or diphtheria ?In some cases, your health care provider may decide to postpone Tdap vaccination until a future visit. ?People with minor illnesses, such as a cold, may be vaccinated. People who are moderately or severely ill  should usually wait until they recover before getting Tdap vaccine.  ?Your health care provider can give you more information. ?4. Risks of a vaccine reaction ?Pain, redness, or swelling where the shot was given, mild fever, headache, feeling tired, and nausea, vomiting, diarrhea, or stomachache sometimes happen after Tdap vaccination. ?People sometimes faint after medical procedures, including vaccination. Tell your provider if you feel dizzy or have vision changes or ringing in the ears.  ?As with any medicine, there is a very remote chance of a vaccine causing a severe allergic reaction, other serious injury, or death. ?5. What if there is a serious problem? ?An allergic reaction could occur after the vaccinated person leaves the clinic. If you see signs of a severe allergic reaction (hives, swelling of the face and throat, difficulty breathing, a fast heartbeat, dizziness, or weakness), call 9-1-1 and get the person to the nearest hospital. ?For other signs that concern you, call your health care provider.  ?Adverse reactions should be reported to the Vaccine Adverse Event Reporting System (VAERS). Your health care provider will usually file this report, or you can do it yourself. Visit the VAERS website at www.vaers.SamedayNews.es or call 516-435-4749. VAERS is only for reporting reactions, and VAERS staff members do not give medical advice. ?6. The National Vaccine Injury Compensation Program ?The [employer] Vaccine Injury Compensation Program (VICP) is a Technical brewer that was  created to compensate people who may have been injured by certain vaccines. Claims regarding alleged injury or death due to vaccination have a time limit for filing, which may be as short as two years. Visit the VICP website at GoldCloset.com.ee or call (762) 257-9733 to learn about the program and about filing a claim. ?7. How can I learn more? ?Ask your health care provider. ?Call your local or state health  department. ?Visit the website of the Food and Drug Administration (FDA) for vaccine package inserts and additional information at TraderRating.uy. ?Contact the Centers for Disease Control and

## 2021-05-27 NOTE — Progress Notes (Signed)
Vaccines updated: ? ?DC Pediarix (DTAP+HepB+ IPV) and HIB to: ? ?Change orders to the following Pentacel (DTAP+IPV+HIB) and Engerix B that are stocked in [place] [facility]. ? ?Henreitta Leber, PharmD ?

## 2021-05-27 NOTE — Progress Notes (Signed)
Pt reports no recent dental work or concerns ?

## 2021-05-28 ENCOUNTER — Other Ambulatory Visit: Payer: Self-pay

## 2021-05-28 DIAGNOSIS — C9001 Multiple myeloma in remission: Secondary | ICD-10-CM

## 2021-05-28 MED ORDER — LENALIDOMIDE 10 MG PO CAPS: 10.0000 mg | ORAL_CAPSULE | Freq: Every day | ORAL | 0 refills | Status: DC

## 2021-05-29 LAB — MULTIPLE MYELOMA PANEL, SERUM
Albumin SerPl Elph-Mcnc: 3.8 g/dL (ref 2.9–4.4)
Albumin/Glob SerPl: 1.3 (ref 0.7–1.7)
Alpha 1: 0.3 g/dL (ref 0.0–0.4)
Alpha2 Glob SerPl Elph-Mcnc: 0.8 g/dL (ref 0.4–1.0)
B-Globulin SerPl Elph-Mcnc: 1.1 g/dL (ref 0.7–1.3)
Gamma Glob SerPl Elph-Mcnc: 0.9 g/dL (ref 0.4–1.8)
Globulin, Total: 3.1 g/dL (ref 2.2–3.9)
IgA: 170 mg/dL (ref 87–352)
IgG (Immunoglobin G), Serum: 961 mg/dL (ref 586–1602)
IgM (Immunoglobulin M), Srm: 35 mg/dL (ref 26–217)
Total Protein ELP: 6.9 g/dL (ref 6.0–8.5)

## 2021-05-30 ENCOUNTER — Other Ambulatory Visit: Payer: Self-pay | Admitting: Hematology

## 2021-05-30 DIAGNOSIS — C9000 Multiple myeloma not having achieved remission: Secondary | ICD-10-CM

## 2021-05-30 DIAGNOSIS — C7951 Secondary malignant neoplasm of bone: Secondary | ICD-10-CM

## 2021-05-30 DIAGNOSIS — R609 Edema, unspecified: Secondary | ICD-10-CM

## 2021-06-02 ENCOUNTER — Other Ambulatory Visit: Payer: Self-pay | Admitting: Hematology

## 2021-06-02 DIAGNOSIS — C9110 Chronic lymphocytic leukemia of B-cell type not having achieved remission: Secondary | ICD-10-CM

## 2021-06-03 ENCOUNTER — Other Ambulatory Visit: Payer: Self-pay

## 2021-06-03 ENCOUNTER — Encounter: Payer: Self-pay | Admitting: Hematology

## 2021-06-03 DIAGNOSIS — C9001 Multiple myeloma in remission: Secondary | ICD-10-CM

## 2021-06-03 MED ORDER — FENTANYL 25 MCG/HR TD PT72: 1.0000 | MEDICATED_PATCH | TRANSDERMAL | 0 refills | Status: DC

## 2021-06-06 ENCOUNTER — Other Ambulatory Visit: Payer: Self-pay | Admitting: Hematology

## 2021-06-12 ENCOUNTER — Other Ambulatory Visit: Payer: Self-pay | Admitting: Hematology

## 2021-06-20 ENCOUNTER — Other Ambulatory Visit: Payer: Self-pay

## 2021-06-20 DIAGNOSIS — C9001 Multiple myeloma in remission: Secondary | ICD-10-CM

## 2021-06-23 ENCOUNTER — Other Ambulatory Visit: Payer: Self-pay | Admitting: Hematology

## 2021-06-23 DIAGNOSIS — C9001 Multiple myeloma in remission: Secondary | ICD-10-CM

## 2021-06-24 ENCOUNTER — Ambulatory Visit: Payer: 59

## 2021-06-24 ENCOUNTER — Other Ambulatory Visit (HOSPITAL_COMMUNITY): Payer: Self-pay

## 2021-06-24 ENCOUNTER — Other Ambulatory Visit: Payer: Self-pay

## 2021-06-24 ENCOUNTER — Inpatient Hospital Stay: Payer: 59 | Attending: Hematology

## 2021-06-24 ENCOUNTER — Inpatient Hospital Stay: Payer: 59 | Admitting: Hematology

## 2021-06-24 ENCOUNTER — Inpatient Hospital Stay (HOSPITAL_BASED_OUTPATIENT_CLINIC_OR_DEPARTMENT_OTHER): Payer: 59

## 2021-06-24 DIAGNOSIS — R609 Edema, unspecified: Secondary | ICD-10-CM

## 2021-06-24 DIAGNOSIS — C7951 Secondary malignant neoplasm of bone: Secondary | ICD-10-CM | POA: Diagnosis not present

## 2021-06-24 DIAGNOSIS — C9001 Multiple myeloma in remission: Secondary | ICD-10-CM

## 2021-06-24 DIAGNOSIS — C9000 Multiple myeloma not having achieved remission: Secondary | ICD-10-CM | POA: Insufficient documentation

## 2021-06-24 DIAGNOSIS — Z23 Encounter for immunization: Secondary | ICD-10-CM | POA: Insufficient documentation

## 2021-06-24 LAB — CMP (CANCER CENTER ONLY)
ALT: 33 U/L (ref 0–44)
AST: 22 U/L (ref 15–41)
Albumin: 3.9 g/dL (ref 3.5–5.0)
Alkaline Phosphatase: 54 U/L (ref 38–126)
Anion gap: 12 (ref 5–15)
BUN: 19 mg/dL (ref 6–20)
CO2: 24 mmol/L (ref 22–32)
Calcium: 9.8 mg/dL (ref 8.9–10.3)
Chloride: 101 mmol/L (ref 98–111)
Creatinine: 0.9 mg/dL (ref 0.44–1.00)
GFR, Estimated: >60 mL/min (ref >60)
Glucose, Bld: 110 mg/dL — ABNORMAL HIGH (ref 70–99)
Potassium: 3.5 mmol/L (ref 3.5–5.1)
Sodium: 137 mmol/L (ref 135–145)
Total Bilirubin: 0.7 mg/dL (ref 0.3–1.2)
Total Protein: 7.3 g/dL (ref 6.5–8.1)

## 2021-06-24 LAB — CBC WITH DIFFERENTIAL (CANCER CENTER ONLY)
Abs Immature Granulocytes: 0.03 10*3/uL (ref 0.00–0.07)
Basophils Absolute: 0.1 10*3/uL (ref 0.0–0.1)
Basophils Relative: 1 %
Eosinophils Absolute: 0.6 10*3/uL — ABNORMAL HIGH (ref 0.0–0.5)
Eosinophils Relative: 7 %
HCT: 40.2 % (ref 36.0–46.0)
Hemoglobin: 13.1 g/dL (ref 12.0–15.0)
Immature Granulocytes: 0 %
Lymphocytes Relative: 10 %
Lymphs Abs: 0.9 10*3/uL (ref 0.7–4.0)
MCH: 30.3 pg (ref 26.0–34.0)
MCHC: 32.6 g/dL (ref 30.0–36.0)
MCV: 92.8 fL (ref 80.0–100.0)
Monocytes Absolute: 0.9 10*3/uL (ref 0.1–1.0)
Monocytes Relative: 10 %
Neutro Abs: 6.1 10*3/uL (ref 1.7–7.7)
Neutrophils Relative %: 72 %
Platelet Count: 238 10*3/uL (ref 150–400)
RBC: 4.33 MIL/uL (ref 3.87–5.11)
RDW: 17.2 % — ABNORMAL HIGH (ref 11.5–15.5)
WBC Count: 8.6 10*3/uL (ref 4.0–10.5)
nRBC: 0 % (ref 0.0–0.2)

## 2021-06-24 LAB — MAGNESIUM: Magnesium: 1.9 mg/dL (ref 1.7–2.4)

## 2021-06-24 LAB — PREGNANCY, URINE: Preg Test, Ur: NEGATIVE

## 2021-06-24 MED ORDER — COVID-19 MRNA VACCINE (PFIZER) 30 MCG/0.3ML IM SUSP: 0.3000 mL | Freq: Once | INTRAMUSCULAR | 0 refills | Status: AC

## 2021-06-24 MED ORDER — FENTANYL 25 MCG/HR TD PT72: 1.0000 | MEDICATED_PATCH | TRANSDERMAL | 0 refills | Status: DC

## 2021-06-24 MED ORDER — OXYCODONE HCL 5 MG PO TABS: 5.0000 mg | ORAL_TABLET | Freq: Four times a day (QID) | ORAL | 0 refills | Status: DC | PRN

## 2021-06-24 MED ORDER — APIXABAN 5 MG PO TABS: 5.0000 mg | ORAL_TABLET | Freq: Two times a day (BID) | ORAL | 5 refills | Status: DC

## 2021-06-24 MED ORDER — FUROSEMIDE 20 MG PO TABS
60.0000 mg | ORAL_TABLET | Freq: Every day | ORAL | 0 refills | Status: DC
Start: 2021-06-24 — End: 2021-08-01

## 2021-06-24 NOTE — Progress Notes (Signed)
Per Dr Irene Limbo, no zometa today. Will receive at next appointment in June when pt will also get vaccines listed in tx plan. Today, gave Covid vaccine only. Observed for 15 minutes after vaccination. ?

## 2021-06-25 ENCOUNTER — Other Ambulatory Visit: Payer: Self-pay

## 2021-06-25 ENCOUNTER — Encounter: Payer: Self-pay | Admitting: Hematology

## 2021-06-25 ENCOUNTER — Other Ambulatory Visit (HOSPITAL_COMMUNITY): Payer: Self-pay

## 2021-06-25 ENCOUNTER — Other Ambulatory Visit: Payer: Self-pay | Admitting: Hematology

## 2021-06-25 MED ORDER — FENTANYL 12 MCG/HR TD PT72
1.0000 | MEDICATED_PATCH | TRANSDERMAL | 0 refills | Status: DC
  Filled 2021-06-25: qty 5, 15d supply, fill #0

## 2021-06-26 ENCOUNTER — Encounter: Payer: Self-pay | Admitting: Hematology

## 2021-06-30 ENCOUNTER — Encounter: Payer: Self-pay | Admitting: Hematology

## 2021-06-30 ENCOUNTER — Other Ambulatory Visit (HOSPITAL_COMMUNITY): Payer: Self-pay

## 2021-06-30 ENCOUNTER — Telehealth: Payer: Self-pay | Admitting: Hematology

## 2021-06-30 LAB — MULTIPLE MYELOMA PANEL, SERUM
Albumin SerPl Elph-Mcnc: 3.6 g/dL (ref 2.9–4.4)
Albumin/Glob SerPl: 1.3 (ref 0.7–1.7)
Alpha 1: 0.2 g/dL (ref 0.0–0.4)
Alpha2 Glob SerPl Elph-Mcnc: 0.8 g/dL (ref 0.4–1.0)
B-Globulin SerPl Elph-Mcnc: 1 g/dL (ref 0.7–1.3)
Gamma Glob SerPl Elph-Mcnc: 0.9 g/dL (ref 0.4–1.8)
Globulin, Total: 3 g/dL (ref 2.2–3.9)
IgA: 141 mg/dL (ref 87–352)
IgG (Immunoglobin G), Serum: 916 mg/dL (ref 586–1602)
IgM (Immunoglobulin M), Srm: 38 mg/dL (ref 26–217)
Total Protein ELP: 6.6 g/dL (ref 6.0–8.5)

## 2021-06-30 NOTE — Telephone Encounter (Signed)
Scheduled follow-up appointments per 5/9 los. Patient is aware. ?

## 2021-06-30 NOTE — Progress Notes (Signed)
? ? ?HEMATOLOGY/ONCOLOGY CLINIC NOTE ? ?Date of Service: 06/24/2021 ? ? ? ?Patient Care Team: ?Celene Squibb, MD as PCP - General (Internal Medicine) ?Corrie Mckusick, DO as Consulting Physician (Interventional Radiology) ? ?CHIEF COMPLAINTS/PURPOSE OF CONSULTATION:  ?Follow-up for continued valuation management of myeloma ? ?HISTORY OF PRESENTING ILLNESS:  ?Please see previous note for HPI. ? ?INTERVAL HISTORY: ? ?Stacey Montoya is here for continued valuation and management of multiple myeloma.  She notes that she is tolerating her maintenance Revlimid well without any acute issues. ?Back pain is reasonably controlled and she notes that she was followed up by her neurosurgical and no indication for additional surgery or epidural steroid injections for the back were recommended. ?She is okay with reducing her fentanyl patch dose to try to wean her off of this. ?She tolerated her first round of months 6 vaccinations without any acute issues. ?Labs done today were reviewed in detail with the patient. ? ?MEDICAL HISTORY:  ?Past Medical History:  ?Diagnosis Date  ? Abnormal Pap smear of cervix   ? Back pain   ? Cancer ([place]) 04/02/2020  ? multiple myeloma  ? Hyperlipidemia   ? Hypertension   ? ? ?SURGICAL HISTORY: ?Past Surgical History:  ?Procedure Laterality Date  ? CERVICAL CONIZATION W/BX  12/02/2010  ? Procedure: CONIZATION CERVIX WITH BIOPSY;  Surgeon: Arloa Koh;  Location: [facility];  Service: Gynecology;  Laterality: N/A;  COLD KNIFE  ? CESAREAN SECTION    ? DILATION AND CURETTAGE OF UTERUS  12/02/2010  ? Procedure: DILATATION AND CURETTAGE (D&C);  Surgeon: Arloa Koh;  Location: [facility];  Service: Gynecology;  Laterality: N/A;  ? IR RADIOLOGIST EVAL & MGMT  04/16/2020  ? KYPHOPLASTY Bilateral 09/05/2020  ? Procedure: KYPHOPLASTY THORACIC TWELVE AND LUMBAR FOUR;  Surgeon: Consuella Lose, MD;  Location: [facility];  Service: Neurosurgery;  Laterality: Bilateral;  ? left hand    ? drain - infection  ? WISDOM TOOTH  EXTRACTION    ? ? ?SOCIAL HISTORY: ?Social History  ? ?Socioeconomic History  ? Marital status: Married  ?  Spouse name: Not on file  ? Number of children: Not on file  ? Years of education: Not on file  ? Highest education level: Not on file  ?Occupational History  ? Not on file  ?Tobacco Use  ? Smoking status: Former  ?  Packs/day: 0.50  ?  Years: 20.00  ?  Pack years: 10.00  ?  Types: Cigarettes  ? Smokeless tobacco: Never  ?Vaping Use  ? Vaping Use: Some days  ?Substance and Sexual Activity  ? Alcohol use: Not Currently  ? Drug use: Not Currently  ? Sexual activity: Not Currently  ?  Partners: Male  ?  Birth control/protection: Condom  ?Other Topics Concern  ? Not on file  ?Social History Narrative  ? ** Merged History Encounter **  ?    ? ?Social Determinants of Health  ? ?Financial Resource Strain: Not on file  ?Food Insecurity: Not on file  ?Transportation Needs: Not on file  ?Physical Activity: Not on file  ?Stress: Not on file  ?Social Connections: Not on file  ?Intimate Partner Violence: Not on file  ? ? ?FAMILY HISTORY: ?Family History  ?Problem Relation Age of Onset  ? Lung cancer Maternal Grandfather   ? Pancreatic cancer Paternal Grandfather   ? ? ?ALLERGIES:  is allergic to zithromax [azithromycin]. ? ?MEDICATIONS:  ?Current Outpatient Medications  ?Medication Sig Dispense Refill  ? acyclovir (ZOVIRAX) 400  MG tablet TAKE (1) TABLET BY MOUTH TWICE DAILY. 60 tablet 0  ? albuterol (VENTOLIN HFA) 108 (90 Base) MCG/ACT inhaler Inhale 1-2 puffs into the lungs every 6 (six) hours as needed for wheezing.    ? amLODipine (NORVASC) 5 MG tablet Take 1 tablet (5 mg total) by mouth daily. 30 tablet 0  ? apixaban (ELIQUIS) 5 MG TABS tablet Take 1 tablet (5 mg total) by mouth 2 (two) times daily. 60 tablet 5  ? b complex vitamins capsule Take 1 capsule by mouth daily. 30 capsule   ? ergocalciferol (VITAMIN D2) 1.25 MG (50000 UT) capsule Take 1 capsule (50,000 Units total) by mouth 2 (two) times a week. 12 capsule 2   ? folic acid (FOLVITE) 1 MG tablet TAKE 1 TABLET BY MOUTH ONCE A DAY. 90 tablet 0  ? furosemide (LASIX) 20 MG tablet Take 3 tablets (60 mg total) by mouth daily. 90 tablet 0  ? gabapentin (NEURONTIN) 300 MG capsule TAKE 1 CAPSULE IN THE MORNING, 1 IN THE AFTERNOON, AND TAKE 3 CAPSULES AT BEDTIME. 150 capsule 0  ? magic mouthwash w/lidocaine SOLN Take 5 mLs by mouth 3 (three) times daily as needed for mouth pain. Dispense 472m of magic mouthwash compounded preparation. Patient to use 533m4 times daily as needed for throat discomfort. ?80 ml viscous lidocaine 2% ?80 ml Mylanta ?80 ml diphenhydramine at 12.5 mg per 5 ml elixir ?80 ml nystatin at 100,000U per 5 mL suspension ?80 ml distilled water    ? medroxyPROGESTERone (PROVERA) 10 MG tablet Take 10 mg by mouth daily.    ? nicotine (NICODERM CQ - DOSED IN MG/24 HOURS) 21 mg/24hr patch Place 1 patch (21 mg total) onto the skin daily. 28 patch 0  ? ondansetron (ZOFRAN) 4 MG tablet Take 8 mg by mouth every 8 (eight) hours.    ? oxyCODONE (OXY IR/ROXICODONE) 5 MG immediate release tablet Take 1 tablet (5 mg total) by mouth every 6 (six) hours as needed. 60 tablet 0  ? polyethylene glycol (MIRALAX / GLYCOLAX) 17 g packet Take 17 g by mouth daily as needed for mild constipation.    ? potassium chloride SA (KLOR-CON M) 20 MEQ tablet TAKE (1) TABLET BY MOUTH TWICE DAILY. 60 tablet 0  ? pravastatin (PRAVACHOL) 40 MG tablet Take 40 mg by mouth daily.    ? prochlorperazine (COMPAZINE) 10 MG tablet Take 1 tablet (10 mg total) by mouth every 6 (six) hours as needed for nausea or vomiting. 30 tablet 2  ? fentaNYL (DURAGESIC) 12 MCG/HR Place 1 patch onto the skin every 3 (three) days. 5 patch 0  ? metoprolol tartrate (LOPRESSOR) 50 MG tablet Take 1 tablet (50 mg total) by mouth 2 (two) times daily. 60 tablet 0  ? REVLIMID 10 MG capsule TAKE 1 CAPSULE BY MOUTH DAILY  FOR 21 DAYS 21 capsule 0  ? ?No current facility-administered medications for this visit.  ? ? ?REVIEW OF SYSTEMS:    ?. 10 Point review of Systems was done is negative except as noted above. ? ? ?PHYSICAL EXAMINATION: ?ECOG PERFORMANCE STATUS: 2 - Symptomatic, <50% confined to bed ?.BP 125/90   Pulse 91   Temp 98.1 ?F (36.7 ?C)   Resp 20   Wt 245 lb 8 oz (111.4 kg)   SpO2 99%   BMI 42.14 kg/m?  ?NAD ?GENERAL:alert, in no acute distress and comfortable ?SKIN: no acute rashes, no significant lesions ?EYES: conjunctiva are pink and non-injected, sclera anicteric ?OROPHARYNX: MMM, no exudates,  no oropharyngeal erythema or ulceration ?NECK: supple, no JVD ?LYMPH:  no palpable lymphadenopathy in the cervical, axillary or inguinal regions ?LUNGS: clear to auscultation b/l with normal respiratory effort ?HEART: regular rate & rhythm ?ABDOMEN:  normoactive bowel sounds , non tender, not distended. ?Extremity: no pedal edema ?PSYCH: alert & oriented x 3 with fluent speech ?NEURO: no focal motor/sensory deficits ? ? ?LABORATORY DATA:  ?I have reviewed the data as listed ? ?.. ? ?  Latest Ref Rng & Units 06/24/2021  ? 10:06 AM 05/27/2021  ? 10:22 AM 04/29/2021  ? 10:02 AM  ?CBC  ?WBC 4.0 - 10.5 K/uL 8.6   6.7   6.7    ?Hemoglobin 12.0 - 15.0 g/dL 13.1   12.7   11.6    ?Hematocrit 36.0 - 46.0 % 40.2   39.8   36.6    ?Platelets 150 - 400 K/uL 238   226   241    ? ? ?. ? ?  Latest Ref Rng & Units 06/24/2021  ? 10:06 AM 05/27/2021  ? 10:22 AM 04/29/2021  ? 10:02 AM  ?CMP  ?Glucose 70 - 99 mg/dL 110   112   113    ?BUN 6 - 20 mg/dL 19   15   10     ?Creatinine 0.44 - 1.00 mg/dL 0.90   0.97   0.79    ?Sodium 135 - 145 mmol/L 137   137   137    ?Potassium 3.5 - 5.1 mmol/L 3.5   3.8   3.2    ?Chloride 98 - 111 mmol/L 101   104   101    ?CO2 22 - 32 mmol/L 24   25   26     ?Calcium 8.9 - 10.3 mg/dL 9.8   9.5   8.7    ?Total Protein 6.5 - 8.1 g/dL 7.3   7.4   6.5    ?Total Bilirubin 0.3 - 1.2 mg/dL 0.7   0.5   0.4    ?Alkaline Phos 38 - 126 U/L 54   55   63    ?AST 15 - 41 U/L 22   21   16     ?ALT 0 - 44 U/L 33   37   33    ? ? ? ? ?RADIOGRAPHIC  STUDIES: ?I have personally reviewed the radiological images as listed and agreed with the findings in the report. ?No results found. ? ? ?04/03/2020 Cytogenetics Report ? ? ?04/03/2020 Molecular Pathology FISH An

## 2021-07-04 ENCOUNTER — Other Ambulatory Visit: Payer: Self-pay | Admitting: Hematology

## 2021-07-08 ENCOUNTER — Encounter: Payer: Self-pay | Admitting: Hematology

## 2021-07-21 ENCOUNTER — Other Ambulatory Visit: Payer: Self-pay

## 2021-07-21 ENCOUNTER — Other Ambulatory Visit: Payer: Self-pay | Admitting: Hematology

## 2021-07-21 DIAGNOSIS — C9001 Multiple myeloma in remission: Secondary | ICD-10-CM

## 2021-07-22 ENCOUNTER — Encounter: Payer: Self-pay | Admitting: Hematology

## 2021-07-22 ENCOUNTER — Inpatient Hospital Stay: Payer: 59

## 2021-07-22 ENCOUNTER — Inpatient Hospital Stay: Payer: 59 | Attending: Hematology

## 2021-07-22 ENCOUNTER — Other Ambulatory Visit: Payer: Self-pay

## 2021-07-22 VITALS — BP 123/65 | HR 80 | Temp 98.4°F | Resp 18 | Wt 246.5 lb

## 2021-07-22 DIAGNOSIS — Z23 Encounter for immunization: Secondary | ICD-10-CM | POA: Insufficient documentation

## 2021-07-22 DIAGNOSIS — C9000 Multiple myeloma not having achieved remission: Secondary | ICD-10-CM | POA: Insufficient documentation

## 2021-07-22 DIAGNOSIS — C7951 Secondary malignant neoplasm of bone: Secondary | ICD-10-CM

## 2021-07-22 DIAGNOSIS — Z7189 Other specified counseling: Secondary | ICD-10-CM

## 2021-07-22 DIAGNOSIS — C9001 Multiple myeloma in remission: Secondary | ICD-10-CM

## 2021-07-22 LAB — CBC WITH DIFFERENTIAL (CANCER CENTER ONLY)
Abs Immature Granulocytes: 0.02 10*3/uL (ref 0.00–0.07)
Basophils Absolute: 0 10*3/uL (ref 0.0–0.1)
Basophils Relative: 0 %
Eosinophils Absolute: 0.4 10*3/uL (ref 0.0–0.5)
Eosinophils Relative: 5 %
HCT: 39.2 % (ref 36.0–46.0)
Hemoglobin: 12.7 g/dL (ref 12.0–15.0)
Immature Granulocytes: 0 %
Lymphocytes Relative: 11 %
Lymphs Abs: 0.8 10*3/uL (ref 0.7–4.0)
MCH: 30.2 pg (ref 26.0–34.0)
MCHC: 32.4 g/dL (ref 30.0–36.0)
MCV: 93.1 fL (ref 80.0–100.0)
Monocytes Absolute: 0.9 10*3/uL (ref 0.1–1.0)
Monocytes Relative: 12 %
Neutro Abs: 5.2 10*3/uL (ref 1.7–7.7)
Neutrophils Relative %: 72 %
Platelet Count: 175 10*3/uL (ref 150–400)
RBC: 4.21 MIL/uL (ref 3.87–5.11)
RDW: 17.5 % — ABNORMAL HIGH (ref 11.5–15.5)
WBC Count: 7.2 10*3/uL (ref 4.0–10.5)
nRBC: 0 % (ref 0.0–0.2)

## 2021-07-22 LAB — CMP (CANCER CENTER ONLY)
ALT: 36 U/L (ref 0–44)
AST: 20 U/L (ref 15–41)
Albumin: 4.3 g/dL (ref 3.5–5.0)
Alkaline Phosphatase: 56 U/L (ref 38–126)
Anion gap: 10 (ref 5–15)
BUN: 16 mg/dL (ref 6–20)
CO2: 26 mmol/L (ref 22–32)
Calcium: 9.7 mg/dL (ref 8.9–10.3)
Chloride: 102 mmol/L (ref 98–111)
Creatinine: 0.94 mg/dL (ref 0.44–1.00)
GFR, Estimated: >60 mL/min (ref >60)
Glucose, Bld: 120 mg/dL — ABNORMAL HIGH (ref 70–99)
Potassium: 3.5 mmol/L (ref 3.5–5.1)
Sodium: 138 mmol/L (ref 135–145)
Total Bilirubin: 0.7 mg/dL (ref 0.3–1.2)
Total Protein: 7.2 g/dL (ref 6.5–8.1)

## 2021-07-22 LAB — PREGNANCY, URINE: Preg Test, Ur: NEGATIVE

## 2021-07-22 LAB — MAGNESIUM: Magnesium: 1.8 mg/dL (ref 1.7–2.4)

## 2021-07-22 MED ORDER — HEPATITIS B VAC RECOMBINANT 20 MCG/ML IJ SUSP
2.0000 mL | Freq: Once | INTRAMUSCULAR | Status: AC
  Administered 2021-07-22: 40 ug via INTRAMUSCULAR
  Filled 2021-07-22: qty 2

## 2021-07-22 MED ORDER — SODIUM CHLORIDE 0.9 % IV SOLN: Freq: Once | INTRAVENOUS | Status: AC

## 2021-07-22 MED ORDER — DTAP-IPV-HIB VACCINE IM SUSR
0.5000 mL | Freq: Once | INTRAMUSCULAR | Status: AC
  Administered 2021-07-22: 0.5 mL via INTRAMUSCULAR
  Filled 2021-07-22: qty 1

## 2021-07-22 MED ORDER — ZOLEDRONIC ACID 4 MG/100ML IV SOLN
4.0000 mg | Freq: Once | INTRAVENOUS | Status: AC
  Administered 2021-07-22: 4 mg via INTRAVENOUS
  Filled 2021-07-22: qty 100

## 2021-07-22 MED ORDER — PNEUMOCOCCAL 13-VAL CONJ VACC IM SUSP
0.5000 mL | Freq: Once | INTRAMUSCULAR | Status: AC
  Administered 2021-07-22: 0.5 mL via INTRAMUSCULAR
  Filled 2021-07-22: qty 0.5

## 2021-07-22 NOTE — Progress Notes (Signed)
Per patient, no recent dental work or concerns.  COVID 2nd dose not given, per Melissa RPh not in stock at [facility]  RN recommended patient go to outpatient pharmacy or local pharmacy.  Pt verbalized understanding.

## 2021-07-22 NOTE — Patient Instructions (Addendum)
Zoledronic Acid Injection (Cancer) What is this medication? ZOLEDRONIC ACID (ZOE le dron ik AS id) treats high calcium levels in the blood caused by cancer. It may also be used with chemotherapy to treat weakened bones caused by cancer. It works by slowing down the release of calcium from bones. This lowers calcium levels in your blood. It also makes your bones stronger and less likely to break (fracture). It belongs to a group of medications called bisphosphonates. This medicine may be used for other purposes; ask your health care provider or pharmacist if you have questions. COMMON BRAND NAME(S): Zometa, Zometa Powder What should I tell my care team before I take this medication? They need to know if you have any of these conditions: Dehydration Dental disease Kidney disease Liver disease Low levels of calcium in the blood Lung or breathing disease, such as asthma Receiving steroids, such as dexamethasone or prednisone An unusual or allergic reaction to zoledronic acid, other medications, foods, dyes, or preservatives Pregnant or trying to get pregnant Breast-feeding How should I use this medication? This medication is injected into a vein. It is given by your care team in a hospital or clinic setting. Talk to your care team about the use of this medication in children. Special care may be needed. Overdosage: If you think you have taken too much of this medicine contact a poison control center or emergency room at once. NOTE: This medicine is only for you. Do not share this medicine with others. What if I miss a dose? Keep appointments for follow-up doses. It is important not to miss your dose. Call your care team if you are unable to keep an appointment. What may interact with this medication? Certain antibiotics given by injection Diuretics, such as bumetanide, furosemide NSAIDs, medications for pain and inflammation, such as ibuprofen or naproxen Teriparatide Thalidomide This list  may not describe all possible interactions. Give your health care provider a list of all the medicines, herbs, non-prescription drugs, or dietary supplements you use. Also tell them if you smoke, drink alcohol, or use illegal drugs. Some items may interact with your medicine. What should I watch for while using this medication? Visit your care team for regular checks on your progress. It may be some time before you see the benefit from this medication. Some people who take this medication have severe bone, joint, or muscle pain. This medication may also increase your risk for jaw problems or a broken thigh bone. Tell your care team right away if you have severe pain in your jaw, bones, joints, or muscles. Tell you care team if you have any pain that does not go away or that gets worse. Tell your dentist and dental surgeon that you are taking this medication. You should not have major dental surgery while on this medication. See your dentist to have a dental exam and fix any dental problems before starting this medication. Take good care of your teeth while on this medication. Make sure you see your dentist for regular follow-up appointments. You should make sure you get enough calcium and vitamin D while you are taking this medication. Discuss the foods you eat and the vitamins you take with your care team. Check with your care team if you have severe diarrhea, nausea, and vomiting, or if you sweat a lot. The loss of too much body fluid may make it dangerous for you to take this medication. You may need bloodwork while taking this medication. Talk to your care team if   you wish to become pregnant or think you might be pregnant. This medication can cause serious birth defects. What side effects may I notice from receiving this medication? Side effects that you should report to your care team as soon as possible: Allergic reactions--skin rash, itching, hives, swelling of the face, lips, tongue, or  throat Kidney injury--decrease in the amount of urine, swelling of the ankles, hands, or feet Low calcium level--muscle pain or cramps, confusion, tingling, or numbness in the hands or feet Osteonecrosis of the jaw--pain, swelling, or redness in the mouth, numbness of the jaw, poor healing after dental work, unusual discharge from the mouth, visible bones in the mouth Severe bone, joint, or muscle pain Side effects that usually do not require medical attention (report to your care team if they continue or are bothersome): Constipation Fatigue Fever Loss of appetite Nausea Stomach pain This list may not describe all possible side effects. Call your doctor for medical advice about side effects. You may report side effects to FDA at 1-800-FDA-1088. Where should I keep my medication? This medication is given in a hospital or clinic. It will not be stored at home. NOTE: This sheet is a summary. It may not cover all possible information. If you have questions about this medicine, talk to your doctor, pharmacist, or health care provider.  2023 Elsevier/Gold Standard (2021-03-28 00:00:00)  Pneumococcal Conjugate Vaccine (Prevnar 13) Suspension for Injection What is this medication? PNEUMOCOCCAL VACCINE (NEU mo KOK al vak SEEN) is a vaccine used to prevent pneumococcus bacterial infections. These bacteria can cause serious infections like pneumonia, meningitis, and blood infections. This vaccine will lower your chance of getting pneumonia. If you do get pneumonia, it can make your symptoms milder and your illness shorter. This vaccine will not treat an infection and will not cause infection. This vaccine is recommended for infants and young children, adults with certain medical conditions, and adults 84 years or older. This medicine may be used for other purposes; ask your health care provider or pharmacist if you have questions. COMMON BRAND NAME(S): Prevnar, Prevnar 13 What should I tell my care  team before I take this medication? They need to know if you have any of these conditions: bleeding problems fever immune system problems an unusual or allergic reaction to pneumococcal vaccine, diphtheria toxoid, other vaccines, latex, other medicines, foods, dyes, or preservatives pregnant or trying to get pregnant breast-feeding How should I use this medication? This vaccine is for injection into a muscle. It is given by a health care professional. A copy of Vaccine Information Statements will be given before each vaccination. Read this sheet carefully each time. The sheet may change frequently. Talk to your pediatrician regarding the use of this medicine in children. While this drug may be prescribed for children as young as 45 weeks old for selected conditions, precautions do apply. Overdosage: If you think you have taken too much of this medicine contact a poison control center or emergency room at once. NOTE: This medicine is only for you. Do not share this medicine with others. What if I miss a dose? It is important not to miss your dose. Call your doctor or health care professional if you are unable to keep an appointment. What may interact with this medication? medicines for cancer chemotherapy medicines that suppress your immune function steroid medicines like prednisone or cortisone This list may not describe all possible interactions. Give your health care provider a list of all the medicines, herbs, non-prescription drugs, or dietary  supplements you use. Also tell them if you smoke, drink alcohol, or use illegal drugs. Some items may interact with your medicine. What should I watch for while using this medication? Mild fever and pain should go away in 3 days or less. Report any unusual symptoms to your doctor or health care professional. What side effects may I notice from receiving this medication? Side effects that you should report to your doctor or health care professional as  soon as possible: allergic reactions like skin rash, itching or hives, swelling of the face, lips, or tongue breathing problems confused fast or irregular heartbeat fever over 102 degrees F seizures unusual bleeding or bruising unusual muscle weakness Side effects that usually do not require medical attention (report to your doctor or health care professional if they continue or are bothersome): aches and pains diarrhea fever of 102 degrees F or less headache irritable loss of appetite pain, tender at site where injected trouble sleeping This list may not describe all possible side effects. Call your doctor for medical advice about side effects. You may report side effects to FDA at 1-800-FDA-1088. Where should I keep my medication? This does not apply. This vaccine is given in a clinic, pharmacy, doctor's office, or other health care setting and will not be stored at home. NOTE: This sheet is a summary. It may not cover all possible information. If you have questions about this medicine, talk to your doctor, pharmacist, or health care provider.  2023 Else[employer]/Gold Standard (2013-11-09 00:00:00)  Hepatitis B Vaccine, Recombinant injection What is this medication? HEPATITIS B VACCINE (hep uh TAHY tis  B  VAK seen) is a vaccine. It is used to prevent an infection with the hepatitis B virus. This medicine may be used for other purposes; ask your health care provider or pharmacist if you have questions. COMMON BRAND NAME(S): Engerix-B, Engerix-B Pediatric, HEPLISAV-B, PreHevbrio, Recombivax HB, Recombivax HB Pediatric/Adolescent What should I tell my care team before I take this medication? They need to know if you have any of these conditions: fever, infection heart disease hepatitis B infection immune system problems kidney disease an unusual or allergic reaction to vaccines, yeast, other medicines, foods, dyes, or preservatives pregnant or trying to get  pregnant breast-feeding How should I use this medication? This vaccine is for injection into a muscle. It is given by a health care professional. A copy of Vaccine Information Statements will be given before each vaccination. Read this sheet carefully each time. The sheet may change frequently. Talk to your pediatrician regarding the use of this medicine in children. While this drug may be prescribed for children as young as newborn for selected conditions, precautions do apply. Overdosage: If you think you have taken too much of this medicine contact a poison control center or emergency room at once. NOTE: This medicine is only for you. Do not share this medicine with others. What if I miss a dose? It is important not to miss your dose. Call your doctor or health care professional if you are unable to keep an appointment. What may interact with this medication? medicines that suppress your immune function like adalimumab, anakinra, infliximab medicines to treat cancer steroid medicines like prednisone or cortisone This list may not describe all possible interactions. Give your health care provider a list of all the medicines, herbs, non-prescription drugs, or dietary supplements you use. Also tell them if you smoke, drink alcohol, or use illegal drugs. Some items may interact with your medicine. What should I watch  for while using this medication? See your health care provider for all shots of this vaccine as directed. You must have 3 shots of this vaccine for protection from hepatitis B infection. Tell your doctor right away if you have any serious or unusual side effects after getting this vaccine. What side effects may I notice from receiving this medication? Side effects that you should report to your doctor or health care professional as soon as possible: allergic reactions like skin rash, itching or hives, swelling of the face, lips, or tongue breathing problems confused, irritated fast,  irregular heartbeat flu-like syndrome numb, tingling pain seizures unusually weak or tired Side effects that usually do not require medical attention (report to your doctor or health care professional if they continue or are bothersome): diarrhea fever headache loss of appetite muscle pain nausea pain, redness, swelling, or irritation at site where injected tiredness This list may not describe all possible side effects. Call your doctor for medical advice about side effects. You may report side effects to FDA at 1-800-FDA-1088. Where should I keep my medication? This drug is given in a hospital or clinic and will not be stored at home. NOTE: This sheet is a summary. It may not cover all possible information. If you have questions about this medicine, talk to your doctor, pharmacist, or health care provider.  2023 Elsevier/Gold Standard (2013-06-05 00:00:00)  Diphtheria, Tetanus, Acellular Pertussis; Haemophilus; Poliovirus Vaccine What is this medication? DIPHTHERIA TOXOID, TETANUS TOXOID, ACELLULAR PERTUSSIS VACCINE, DTaP; HAEMOPHILUS INFLUENZA TYPE B CONJUGATE VACCINE; INACTIVATED POLIOVIRUS VACCINE, IPV (dif THEER ee uh TOK soid, TET n [place] TOK soid, ey SEL yuh ler per TUS iss vak SEEN, DTaP; hem OFF fil [place] in floo En zuh tahyp B CON ju gate ed vak SEEN; in ak tuh vey ted poh lee oh vahy ruhs vak SEEN, IPV) is used to help prevent diphtheria, tetanus, pertussis, haemophilus influenza type b, and polio infections. This medicine may be used for other purposes; ask your health care provider or pharmacist if you have questions. COMMON BRAND NAME(S): Pentacel What should I tell my care team before I take this medication? They need to know if you have any of these conditions: blood disorders like hemophilia fever or infection immune system problems neurologic disease take medicines that treat or prevent blood clots seizures an unusual or allergic reaction to Diphtheria Toxoid, Tetanus  Toxoid, Acellular Pertussis Vaccine, DTaP; Haemophilus influenzae type b Conjugate Vaccine; Inactivated Poliovirus Vaccine, IPV, other medicines, neomycin, polymyxin b, albumin, polysorbate 80, foods, dyes, or preservatives pregnant or trying to get pregnant breast-feeding How should I use this medication? This vaccine is for injection into a muscle. It is given by a health care professional. A copy of Vaccine Information Statements will be given before each vaccination. Read this sheet carefully each time. The sheet may change frequently. Talk to your pediatrician regarding the use of this medicine in children. While this drug may be prescribed for children as young as 6 weeks for selected conditions, precautions do apply. Overdosage: If you think you have taken too much of this medicine contact a poison control center or emergency room at once. NOTE: This medicine is only for you. Do not share this medicine with others. What if I miss a dose? Keep appointments for follow-up (booster) doses as directed. It is important not to miss your dose. Call your doctor or health care professional if you are unable to keep an appointment. What may interact with this medication? medicines that suppress  your immune function like adalimumab, anakinra, infliximab, rilonacept medicines to treat cancer steroid medicines like prednisone or cortisone This list may not describe all possible interactions. Give your health care provider a list of all the medicines, herbs, non-prescription drugs, or dietary supplements you use. Also tell them if you smoke, drink alcohol, or use illegal drugs. Some items may interact with your medicine. What should I watch for while using this medication? Contact your doctor or health care professional and get emergency medical care if any serious side effects occur. This vaccine, like all vaccines, may not fully protect everyone. What side effects may I notice from receiving this  medication? Side effects that you should report to your doctor or health care professional as soon as possible: allergic reactions like skin rash, itching or hives, swelling of the face, lips, or tongue breathing problems fever over 103 degrees F inconsolable crying for 3 hours or more seizures unusually weak or tired Side effects that usually do not require medical attention (report to your doctor or health care professional if they continue or are bothersome): bruising, pain, swelling at site where injected fussy loss of appetite low-grade fever sleepy vomiting This list may not describe all possible side effects. Call your doctor for medical advice about side effects. You may report side effects to FDA at 1-800-FDA-1088. Where should I keep my medication? This vaccine is only given in a clinic, pharmacy, doctor's office, or other health care setting and will not be stored at home. NOTE: This sheet is a summary. It may not cover all possible information. If you have questions about this medicine, talk to your doctor, pharmacist, or health care provider.  2023 Elsevier/Gold Standard (2019-06-15 00:00:00)

## 2021-07-24 ENCOUNTER — Other Ambulatory Visit: Payer: Self-pay

## 2021-07-24 DIAGNOSIS — C9001 Multiple myeloma in remission: Secondary | ICD-10-CM

## 2021-07-24 MED ORDER — LENALIDOMIDE 10 MG PO CAPS: ORAL_CAPSULE | ORAL | 0 refills | Status: DC

## 2021-07-26 ENCOUNTER — Other Ambulatory Visit: Payer: Self-pay | Admitting: Hematology

## 2021-07-28 ENCOUNTER — Encounter: Payer: Self-pay | Admitting: Hematology

## 2021-07-28 LAB — MULTIPLE MYELOMA PANEL, SERUM
Albumin SerPl Elph-Mcnc: 3.8 g/dL (ref 2.9–4.4)
Albumin/Glob SerPl: 1.3 (ref 0.7–1.7)
Alpha 1: 0.3 g/dL (ref 0.0–0.4)
Alpha2 Glob SerPl Elph-Mcnc: 0.8 g/dL (ref 0.4–1.0)
B-Globulin SerPl Elph-Mcnc: 1.1 g/dL (ref 0.7–1.3)
Gamma Glob SerPl Elph-Mcnc: 0.8 g/dL (ref 0.4–1.8)
Globulin, Total: 3 g/dL (ref 2.2–3.9)
IgA: 144 mg/dL (ref 87–352)
IgG (Immunoglobin G), Serum: 856 mg/dL (ref 586–1602)
IgM (Immunoglobulin M), Srm: 30 mg/dL (ref 26–217)
Total Protein ELP: 6.8 g/dL (ref 6.0–8.5)

## 2021-08-01 ENCOUNTER — Other Ambulatory Visit: Payer: Self-pay | Admitting: Hematology

## 2021-08-01 DIAGNOSIS — C7951 Secondary malignant neoplasm of bone: Secondary | ICD-10-CM

## 2021-08-14 ENCOUNTER — Other Ambulatory Visit: Payer: Self-pay

## 2021-08-14 DIAGNOSIS — C9000 Multiple myeloma not having achieved remission: Secondary | ICD-10-CM

## 2021-08-15 ENCOUNTER — Other Ambulatory Visit: Payer: Self-pay | Admitting: Hematology

## 2021-08-20 ENCOUNTER — Inpatient Hospital Stay: Payer: 59 | Attending: Hematology | Admitting: Hematology

## 2021-08-20 ENCOUNTER — Other Ambulatory Visit: Payer: Self-pay

## 2021-08-20 ENCOUNTER — Inpatient Hospital Stay: Payer: 59

## 2021-08-20 VITALS — BP 113/69 | HR 113 | Temp 97.9°F | Resp 16 | Ht 64.0 in | Wt 246.4 lb

## 2021-08-20 DIAGNOSIS — Z87891 Personal history of nicotine dependence: Secondary | ICD-10-CM | POA: Insufficient documentation

## 2021-08-20 DIAGNOSIS — C9001 Multiple myeloma in remission: Secondary | ICD-10-CM | POA: Diagnosis not present

## 2021-08-20 DIAGNOSIS — C9000 Multiple myeloma not having achieved remission: Secondary | ICD-10-CM

## 2021-08-20 DIAGNOSIS — G893 Neoplasm related pain (acute) (chronic): Secondary | ICD-10-CM | POA: Diagnosis not present

## 2021-08-20 LAB — PREGNANCY, URINE: Preg Test, Ur: NEGATIVE

## 2021-08-20 LAB — CMP (CANCER CENTER ONLY)
ALT: 43 U/L (ref 0–44)
AST: 27 U/L (ref 15–41)
Albumin: 4.2 g/dL (ref 3.5–5.0)
Alkaline Phosphatase: 79 U/L (ref 38–126)
Anion gap: 9 (ref 5–15)
BUN: 19 mg/dL (ref 6–20)
CO2: 28 mmol/L (ref 22–32)
Calcium: 10.3 mg/dL (ref 8.9–10.3)
Chloride: 99 mmol/L (ref 98–111)
Creatinine: 1.3 mg/dL — ABNORMAL HIGH (ref 0.44–1.00)
GFR, Estimated: 52 mL/min — ABNORMAL LOW (ref >60)
Glucose, Bld: 115 mg/dL — ABNORMAL HIGH (ref 70–99)
Potassium: 3.8 mmol/L (ref 3.5–5.1)
Sodium: 136 mmol/L (ref 135–145)
Total Bilirubin: 0.4 mg/dL (ref 0.3–1.2)
Total Protein: 8.4 g/dL — ABNORMAL HIGH (ref 6.5–8.1)

## 2021-08-20 LAB — CBC WITH DIFFERENTIAL (CANCER CENTER ONLY)
Abs Immature Granulocytes: 0.05 10*3/uL (ref 0.00–0.07)
Basophils Absolute: 0 10*3/uL (ref 0.0–0.1)
Basophils Relative: 1 %
Eosinophils Absolute: 0.9 10*3/uL — ABNORMAL HIGH (ref 0.0–0.5)
Eosinophils Relative: 13 %
HCT: 38 % (ref 36.0–46.0)
Hemoglobin: 12.5 g/dL (ref 12.0–15.0)
Immature Granulocytes: 1 %
Lymphocytes Relative: 11 %
Lymphs Abs: 0.8 10*3/uL (ref 0.7–4.0)
MCH: 30.1 pg (ref 26.0–34.0)
MCHC: 32.9 g/dL (ref 30.0–36.0)
MCV: 91.6 fL (ref 80.0–100.0)
Monocytes Absolute: 0.5 10*3/uL (ref 0.1–1.0)
Monocytes Relative: 7 %
Neutro Abs: 4.5 10*3/uL (ref 1.7–7.7)
Neutrophils Relative %: 67 %
Platelet Count: 290 10*3/uL (ref 150–400)
RBC: 4.15 MIL/uL (ref 3.87–5.11)
RDW: 17.4 % — ABNORMAL HIGH (ref 11.5–15.5)
WBC Count: 6.7 10*3/uL (ref 4.0–10.5)
nRBC: 0 % (ref 0.0–0.2)

## 2021-08-20 LAB — MAGNESIUM: Magnesium: 2.3 mg/dL (ref 1.7–2.4)

## 2021-08-20 MED ORDER — LENALIDOMIDE 10 MG PO CAPS: ORAL_CAPSULE | ORAL | 0 refills | Status: DC

## 2021-08-20 MED ORDER — DULOXETINE HCL 30 MG PO CPEP: 30.0000 mg | ORAL_CAPSULE | Freq: Every day | ORAL | 3 refills | Status: DC

## 2021-08-20 NOTE — Progress Notes (Signed)
HEMATOLOGY/ONCOLOGY CLINIC NOTE  Date of Service: 08/20/2021  Patient Care Team: Celene Squibb, MD as PCP - General (Internal Medicine) Corrie Mckusick, DO as Consulting Physician (Interventional Radiology)  CHIEF COMPLAINTS/PURPOSE OF CONSULTATION:  Follow-up for continued valuation management of myeloma  HISTORY OF PRESENTING ILLNESS:  Please see previous note for HPI.  INTERVAL HISTORY: Stacey Montoya is a 46 y.o. female here for continued valuation and management of multiple myeloma. She reports She is doing well with no new symptoms or concerns.  She reports persistent pedal edema b/l. She notes while on vacation her feet were incredibly swollen.  She notes grade 1 neuropathy that she describes as burning in her feet. We discussed trying Cymbalta and Palmitoylethanolamide supplement which she is agreeable to.  She has stopped the fentanyl patch.   She notes that she is tolerating her maintenance Revlimid well without any acute issues.  No other new or acute focal symptoms.  Labs done today were reviewed in detail with the patient.  MEDICAL HISTORY:  Past Medical History:  Diagnosis Date   Abnormal Pap smear of cervix    Back pain    Cancer ([facility]) 04/02/2020   multiple myeloma   Hyperlipidemia    Hypertension     SURGICAL HISTORY: Past Surgical History:  Procedure Laterality Date   CERVICAL CONIZATION W/BX  12/02/2010   Procedure: CONIZATION CERVIX WITH BIOPSY;  Surgeon: Arloa Koh;  Location: [facility];  Service: Gynecology;  Laterality: N/A;  COLD KNIFE   CESAREAN SECTION     DILATION AND CURETTAGE OF UTERUS  12/02/2010   Procedure: DILATATION AND CURETTAGE (D&C);  Surgeon: Arloa Koh;  Location: [facility];  Service: Gynecology;  Laterality: N/A;   IR RADIOLOGIST EVAL & MGMT  04/16/2020   KYPHOPLASTY Bilateral 09/05/2020   Procedure: KYPHOPLASTY THORACIC TWELVE AND LUMBAR FOUR;  Surgeon: Consuella Lose, MD;  Location: [facility];  Service: Neurosurgery;   Laterality: Bilateral;   left hand     drain - infection   WISDOM TOOTH EXTRACTION      SOCIAL HISTORY: Social History   Socioeconomic History   Marital status: Married    Spouse name: Not on file   Number of children: Not on file   Years of education: Not on file   Highest education level: Not on file  Occupational History   Not on file  Tobacco Use   Smoking status: Former    Packs/day: 0.50    Years: 20.00    Total pack years: 10.00    Types: Cigarettes   Smokeless tobacco: Never  Vaping Use   Vaping Use: Some days  Substance and Sexual Activity   Alcohol use: Not Currently   Drug use: Not Currently   Sexual activity: Not Currently    Partners: Male    Birth control/protection: Condom  Other Topics Concern   Not on file  Social History Narrative   ** Merged History Encounter **       Social Determinants of Health   Financial Resource Strain: Not on file  Food Insecurity: Not on file  Transportation Needs: Not on file  Physical Activity: Not on file  Stress: Not on file  Social Connections: Not on file  Intimate Partner Violence: Not on file    FAMILY HISTORY: Family History  Problem Relation Age of Onset   Lung cancer Maternal Grandfather    Pancreatic cancer Paternal Grandfather     ALLERGIES:  is allergic to zithromax [azithromycin].  MEDICATIONS:  Current Outpatient Medications  Medication Sig Dispense Refill   acyclovir (ZOVIRAX) 400 MG tablet TAKE (1) TABLET BY MOUTH TWICE DAILY. 60 tablet 0   albuterol (VENTOLIN HFA) 108 (90 Base) MCG/ACT inhaler Inhale 1-2 puffs into the lungs every 6 (six) hours as needed for wheezing.     amLODipine (NORVASC) 5 MG tablet Take 1 tablet (5 mg total) by mouth daily. 30 tablet 0   b complex vitamins capsule Take 1 capsule by mouth daily. 30 capsule    ELIQUIS 5 MG TABS tablet TAKE 1 TABLET BY MOUTH TWICE DAILY. 60 tablet 0   ergocalciferol (VITAMIN D2) 1.25 MG (50000 UT) capsule Take 1 capsule (50,000 Units  total) by mouth 2 (two) times a week. 12 capsule 2   fentaNYL (DURAGESIC) 12 MCG/HR Place 1 patch onto the skin every 3 (three) days. 5 patch 0   folic acid (FOLVITE) 1 MG tablet TAKE 1 TABLET BY MOUTH ONCE A DAY. 90 tablet 0   furosemide (LASIX) 20 MG tablet TAKE 3 TABLETS BY MOUTH DAILY. 90 tablet 0   gabapentin (NEURONTIN) 300 MG capsule Take 367m in the morning, 600 mgin the afternoon and 9066mat bedtime. 180 capsule 0   lenalidomide (REVLIMID) 10 MG capsule TAKE 1 CAPSULE BY MOUTH DAILY  FOR 21 DAYS 21 capsule 0   magic mouthwash w/lidocaine SOLN Take 5 mLs by mouth 3 (three) times daily as needed for mouth pain. Dispense 40070mf magic mouthwash compounded preparation. Patient to use 5ml22mtimes daily as needed for throat discomfort. 80 ml viscous lidocaine 2% 80 ml Mylanta 80 ml diphenhydramine at 12.5 mg per 5 ml elixir 80 ml nystatin at 100,000U per 5 mL suspension 80 ml distilled water     medroxyPROGESTERone (PROVERA) 10 MG tablet Take 10 mg by mouth daily.     metoprolol tartrate (LOPRESSOR) 50 MG tablet Take 1 tablet (50 mg total) by mouth 2 (two) times daily. 60 tablet 0   nicotine (NICODERM CQ - DOSED IN MG/24 HOURS) 21 mg/24hr patch Place 1 patch (21 mg total) onto the skin daily. 28 patch 0   ondansetron (ZOFRAN) 4 MG tablet Take 8 mg by mouth every 8 (eight) hours.     oxyCODONE (OXY IR/ROXICODONE) 5 MG immediate release tablet TAKE 1 TABLET BY MOUTH EVERY 6 HOURS AS NEEDED. 60 tablet 0   polyethylene glycol (MIRALAX / GLYCOLAX) 17 g packet Take 17 g by mouth daily as needed for mild constipation.     potassium chloride SA (KLOR-CON M) 20 MEQ tablet TAKE (1) TABLET BY MOUTH TWICE DAILY. 60 tablet 0   pravastatin (PRAVACHOL) 40 MG tablet Take 40 mg by mouth daily.     prochlorperazine (COMPAZINE) 10 MG tablet Take 1 tablet (10 mg total) by mouth every 6 (six) hours as needed for nausea or vomiting. 30 tablet 2   No current facility-administered medications for this visit.     REVIEW OF SYSTEMS:   . 10 Point review of Systems was done is negative except as noted above.   PHYSICAL EXAMINATION: ECOG PERFORMANCE STATUS: 2 - Symptomatic, <50% confined to bed .BP 113/69 (BP Location: Left Arm, Patient Position: Sitting)   Pulse (!) 113 Comment: nurse notified  Temp 97.9 F (36.6 C) (Temporal)   Resp 16   Ht 5' 4"  (1.626 m)   Wt 246 lb 6.4 oz (111.8 kg)   SpO2 100%   BMI 42.29 kg/m  NAD GENERAL:alert, in no acute distress and comfortable SKIN: no acute  rashes, no significant lesions EYES: conjunctiva are pink and non-injected, sclera anicteric NECK: supple, no JVD LYMPH:  no palpable lymphadenopathy in the cervical, axillary or inguinal regions LUNGS: clear to auscultation b/l with normal respiratory effort HEART: regular rate & rhythm ABDOMEN:  normoactive bowel sounds , non tender, not distended. Extremity: 1+ pedal edema b/l PSYCH: alert & oriented x 3 with fluent speech NEURO: no focal motor/sensory deficits  Exam performed in chair.  LABORATORY DATA:  I have reviewed the data as listed  .Marland Kitchen    Latest Ref Rng & Units 07/22/2021    9:42 AM 06/24/2021   10:06 AM 05/27/2021   10:22 AM  CBC  WBC 4.0 - 10.5 K/uL 7.2  8.6  6.7   Hemoglobin 12.0 - 15.0 g/dL 12.7  13.1  12.7   Hematocrit 36.0 - 46.0 % 39.2  40.2  39.8   Platelets 150 - 400 K/uL 175  238  226     .    Latest Ref Rng & Units 07/22/2021    9:42 AM 06/24/2021   10:06 AM 05/27/2021   10:22 AM  CMP  Glucose 70 - 99 mg/dL 120  110  112   BUN 6 - 20 mg/dL 16  19  15    Creatinine 0.44 - 1.00 mg/dL 0.94  0.90  0.97   Sodium 135 - 145 mmol/L 138  137  137   Potassium 3.5 - 5.1 mmol/L 3.5  3.5  3.8   Chloride 98 - 111 mmol/L 102  101  104   CO2 22 - 32 mmol/L 26  24  25    Calcium 8.9 - 10.3 mg/dL 9.7  9.8  9.5   Total Protein 6.5 - 8.1 g/dL 7.2  7.3  7.4   Total Bilirubin 0.3 - 1.2 mg/dL 0.7  0.7  0.5   Alkaline Phos 38 - 126 U/L 56  54  55   AST 15 - 41 U/L 20  22  21    ALT 0 - 44  U/L 36  33  37       RADIOGRAPHIC STUDIES: I have personally reviewed the radiological images as listed and agreed with the findings in the report. No results found.   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  2) h/o hypercalcemia due to multiple myeloma-now resolved with IV fluids, calcitonin, pamidronate. 3) h/o anemia due to multiple myeloma becoming more apparent as the patient's hemoconcentration due to dehydration has been corrected.   4) h/o acute renal failure related to dehydration hypercalcemia and multiple myeloma.  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  PLAN: -Labs done today were reviewed in detail with the patient.  CBC and CMP are stable. Myeloma panel today shows no monoclonal protein spike. Magnesium is 2.3. -Patient notes no notable toxicities from her maintenance Revlimid and will continue maintenance Revlimid at this time at 10 mg 3 weeks on 1  week off. -Continue Eliquis. -She has stopped Fentanyl patch 12.5 mcg/h.  -Continuing as needed oxycodone -Start Cymbalta 30 mg 1x p.o daily. -Start Palmitoylethanolamide over the counter supplement.  FOLLOW UP: ase schedule next 3 treatments Zometa every 2 months with labs Return to clinic with Dr. Irene Limbo with labs in 2 months  The total time spent in the appointment was 30  minutes*.  All of the patient's questions were answered with apparent satisfaction. The patient knows to call the clinic with any problems, questions or concerns.   Sullivan Lone MD MS AAHIVMS [facility] Hematology/Oncology Physician [facility]  .*Total Encounter Time as defined by the Centers for Medicare and Medicaid Services includes, in addition to the face-to-face time of a patient visit (documented in the note above) non-face-to-face time: obtaining and reviewing outside history, ordering and reviewing medications, tests or procedures, care coordination (communications with other health care professionals or caregivers) and documentation in the medical record.  I, Melene Muller, am acting as scribe for Dr. Sullivan Lone, MD.  .I have reviewed the above documentation for accuracy and completeness, and I agree with the above. Brunetta Genera MD

## 2021-08-22 ENCOUNTER — Telehealth: Payer: Self-pay | Admitting: Hematology

## 2021-08-22 LAB — LAB REPORT - SCANNED
Albumin, Urine POC: 130.8
Creatinine, POC: 90.2 mg/dL
EGFR: 54
Microalb Creat Ratio: 145

## 2021-08-22 NOTE — Telephone Encounter (Signed)
Left message with follow-up appointments per 7/5 los.

## 2021-08-25 DIAGNOSIS — N1831 Chronic kidney disease, stage 3a: Secondary | ICD-10-CM | POA: Insufficient documentation

## 2021-08-25 LAB — MULTIPLE MYELOMA PANEL, SERUM
Albumin SerPl Elph-Mcnc: 3.5 g/dL (ref 2.9–4.4)
Albumin/Glob SerPl: 0.9 (ref 0.7–1.7)
Alpha 1: 0.5 g/dL — ABNORMAL HIGH (ref 0.0–0.4)
Alpha2 Glob SerPl Elph-Mcnc: 1.1 g/dL — ABNORMAL HIGH (ref 0.4–1.0)
B-Globulin SerPl Elph-Mcnc: 1.4 g/dL — ABNORMAL HIGH (ref 0.7–1.3)
Gamma Glob SerPl Elph-Mcnc: 0.9 g/dL (ref 0.4–1.8)
Globulin, Total: 3.9 g/dL (ref 2.2–3.9)
IgA: 270 mg/dL (ref 87–352)
IgG (Immunoglobin G), Serum: 1051 mg/dL (ref 586–1602)
IgM (Immunoglobulin M), Srm: 33 mg/dL (ref 26–217)
Total Protein ELP: 7.4 g/dL (ref 6.0–8.5)

## 2021-08-26 ENCOUNTER — Encounter: Payer: Self-pay | Admitting: Hematology

## 2021-08-27 DIAGNOSIS — R000 Tachycardia, unspecified: Secondary | ICD-10-CM | POA: Insufficient documentation

## 2021-08-27 DIAGNOSIS — E559 Vitamin D deficiency, unspecified: Secondary | ICD-10-CM | POA: Insufficient documentation

## 2021-08-29 ENCOUNTER — Other Ambulatory Visit: Payer: Self-pay | Admitting: Hematology

## 2021-09-01 ENCOUNTER — Encounter: Payer: Self-pay | Admitting: Hematology

## 2021-09-05 ENCOUNTER — Other Ambulatory Visit: Payer: Self-pay | Admitting: Hematology

## 2021-09-05 DIAGNOSIS — C7951 Secondary malignant neoplasm of bone: Secondary | ICD-10-CM

## 2021-09-08 ENCOUNTER — Other Ambulatory Visit: Payer: Self-pay

## 2021-09-09 ENCOUNTER — Other Ambulatory Visit: Payer: Self-pay | Admitting: Hematology

## 2021-09-09 DIAGNOSIS — C9001 Multiple myeloma in remission: Secondary | ICD-10-CM

## 2021-09-11 ENCOUNTER — Encounter: Payer: Self-pay | Admitting: Hematology

## 2021-09-12 ENCOUNTER — Other Ambulatory Visit: Payer: Self-pay | Admitting: Hematology

## 2021-09-13 ENCOUNTER — Other Ambulatory Visit: Payer: Self-pay | Admitting: Hematology

## 2021-09-13 DIAGNOSIS — C9001 Multiple myeloma in remission: Secondary | ICD-10-CM

## 2021-09-16 ENCOUNTER — Other Ambulatory Visit: Payer: Self-pay

## 2021-09-22 ENCOUNTER — Other Ambulatory Visit: Payer: Self-pay

## 2021-09-22 DIAGNOSIS — C9001 Multiple myeloma in remission: Secondary | ICD-10-CM

## 2021-09-23 ENCOUNTER — Other Ambulatory Visit: Payer: Self-pay

## 2021-09-23 ENCOUNTER — Inpatient Hospital Stay: Payer: 59

## 2021-09-23 ENCOUNTER — Encounter: Payer: Self-pay | Admitting: Hematology

## 2021-09-23 ENCOUNTER — Inpatient Hospital Stay: Payer: 59 | Attending: Hematology

## 2021-09-23 VITALS — BP 115/80 | HR 78 | Temp 99.5°F | Resp 16 | Wt 249.5 lb

## 2021-09-23 DIAGNOSIS — C9000 Multiple myeloma not having achieved remission: Secondary | ICD-10-CM | POA: Diagnosis present

## 2021-09-23 DIAGNOSIS — Z23 Encounter for immunization: Secondary | ICD-10-CM | POA: Diagnosis present

## 2021-09-23 DIAGNOSIS — C7951 Secondary malignant neoplasm of bone: Secondary | ICD-10-CM

## 2021-09-23 DIAGNOSIS — Z7189 Other specified counseling: Secondary | ICD-10-CM

## 2021-09-23 DIAGNOSIS — C9001 Multiple myeloma in remission: Secondary | ICD-10-CM

## 2021-09-23 LAB — CBC WITH DIFFERENTIAL/PLATELET
Abs Immature Granulocytes: 0.02 10*3/uL (ref 0.00–0.07)
Basophils Absolute: 0.1 10*3/uL (ref 0.0–0.1)
Basophils Relative: 1 %
Eosinophils Absolute: 0.7 10*3/uL — ABNORMAL HIGH (ref 0.0–0.5)
Eosinophils Relative: 13 %
HCT: 36.4 % (ref 36.0–46.0)
Hemoglobin: 12 g/dL (ref 12.0–15.0)
Immature Granulocytes: 0 %
Lymphocytes Relative: 16 %
Lymphs Abs: 0.9 10*3/uL (ref 0.7–4.0)
MCH: 31.1 pg (ref 26.0–34.0)
MCHC: 33 g/dL (ref 30.0–36.0)
MCV: 94.3 fL (ref 80.0–100.0)
Monocytes Absolute: 0.6 10*3/uL (ref 0.1–1.0)
Monocytes Relative: 11 %
Neutro Abs: 3.1 10*3/uL (ref 1.7–7.7)
Neutrophils Relative %: 59 %
Platelets: 193 10*3/uL (ref 150–400)
RBC: 3.86 MIL/uL — ABNORMAL LOW (ref 3.87–5.11)
RDW: 18.8 % — ABNORMAL HIGH (ref 11.5–15.5)
WBC: 5.3 10*3/uL (ref 4.0–10.5)
nRBC: 0 % (ref 0.0–0.2)

## 2021-09-23 LAB — COMPREHENSIVE METABOLIC PANEL
ALT: 32 U/L (ref 0–44)
AST: 21 U/L (ref 15–41)
Albumin: 4 g/dL (ref 3.5–5.0)
Alkaline Phosphatase: 57 U/L (ref 38–126)
Anion gap: 8 (ref 5–15)
BUN: 13 mg/dL (ref 6–20)
CO2: 28 mmol/L (ref 22–32)
Calcium: 9.2 mg/dL (ref 8.9–10.3)
Chloride: 101 mmol/L (ref 98–111)
Creatinine, Ser: 0.94 mg/dL (ref 0.44–1.00)
GFR, Estimated: >60 mL/min (ref >60)
Glucose, Bld: 111 mg/dL — ABNORMAL HIGH (ref 70–99)
Potassium: 3.5 mmol/L (ref 3.5–5.1)
Sodium: 137 mmol/L (ref 135–145)
Total Bilirubin: 0.4 mg/dL (ref 0.3–1.2)
Total Protein: 7.4 g/dL (ref 6.5–8.1)

## 2021-09-23 LAB — PREGNANCY, URINE: Preg Test, Ur: NEGATIVE

## 2021-09-23 LAB — MAGNESIUM: Magnesium: 1.8 mg/dL (ref 1.7–2.4)

## 2021-09-23 MED ORDER — PNEUMOCOCCAL 13-VAL CONJ VACC IM SUSP
0.5000 mL | Freq: Once | INTRAMUSCULAR | Status: AC
  Administered 2021-09-23: 0.5 mL via INTRAMUSCULAR
  Filled 2021-09-23: qty 0.5

## 2021-09-23 MED ORDER — ZOLEDRONIC ACID 4 MG/100ML IV SOLN
4.0000 mg | Freq: Once | INTRAVENOUS | Status: AC
  Administered 2021-09-23: 4 mg via INTRAVENOUS
  Filled 2021-09-23: qty 100

## 2021-09-23 MED ORDER — DTAP-IPV-HIB VACCINE IM SUSR
0.5000 mL | Freq: Once | INTRAMUSCULAR | Status: AC
  Administered 2021-09-23: 0.5 mL via INTRAMUSCULAR
  Filled 2021-09-23: qty 1

## 2021-09-23 MED ORDER — SODIUM CHLORIDE 0.9 % IV SOLN: Freq: Once | INTRAVENOUS | Status: AC

## 2021-09-23 NOTE — Patient Instructions (Signed)
Zoledronic Acid Injection (Cancer) What is this medication? ZOLEDRONIC ACID (ZOE le dron ik AS id) treats high calcium levels in the blood caused by cancer. It may also be used with chemotherapy to treat weakened bones caused by cancer. It works by slowing down the release of calcium from bones. This lowers calcium levels in your blood. It also makes your bones stronger and less likely to break (fracture). It belongs to a group of medications called bisphosphonates. This medicine may be used for other purposes; ask your health care provider or pharmacist if you have questions. COMMON BRAND NAME(S): Zometa, Zometa Powder What should I tell my care team before I take this medication? They need to know if you have any of these conditions: Dehydration Dental disease Kidney disease Liver disease Low levels of calcium in the blood Lung or breathing disease, such as asthma Receiving steroids, such as dexamethasone or prednisone An unusual or allergic reaction to zoledronic acid, other medications, foods, dyes, or preservatives Pregnant or trying to get pregnant Breast-feeding How should I use this medication? This medication is injected into a vein. It is given by your care team in a hospital or clinic setting. Talk to your care team about the use of this medication in children. Special care may be needed. Overdosage: If you think you have taken too much of this medicine contact a poison control center or emergency room at once. NOTE: This medicine is only for you. Do not share this medicine with others. What if I miss a dose? Keep appointments for follow-up doses. It is important not to miss your dose. Call your care team if you are unable to keep an appointment. What may interact with this medication? Certain antibiotics given by injection Diuretics, such as bumetanide, furosemide NSAIDs, medications for pain and inflammation, such as ibuprofen or naproxen Teriparatide Thalidomide This list  may not describe all possible interactions. Give your health care provider a list of all the medicines, herbs, non-prescription drugs, or dietary supplements you use. Also tell them if you smoke, drink alcohol, or use illegal drugs. Some items may interact with your medicine. What should I watch for while using this medication? Visit your care team for regular checks on your progress. It may be some time before you see the benefit from this medication. Some people who take this medication have severe bone, joint, or muscle pain. This medication may also increase your risk for jaw problems or a broken thigh bone. Tell your care team right away if you have severe pain in your jaw, bones, joints, or muscles. Tell you care team if you have any pain that does not go away or that gets worse. Tell your dentist and dental surgeon that you are taking this medication. You should not have major dental surgery while on this medication. See your dentist to have a dental exam and fix any dental problems before starting this medication. Take good care of your teeth while on this medication. Make sure you see your dentist for regular follow-up appointments. You should make sure you get enough calcium and vitamin D while you are taking this medication. Discuss the foods you eat and the vitamins you take with your care team. Check with your care team if you have severe diarrhea, nausea, and vomiting, or if you sweat a lot. The loss of too much body fluid may make it dangerous for you to take this medication. You may need bloodwork while taking this medication. Talk to your care team if   you wish to become pregnant or think you might be pregnant. This medication can cause serious birth defects. What side effects may I notice from receiving this medication? Side effects that you should report to your care team as soon as possible: Allergic reactions--skin rash, itching, hives, swelling of the face, lips, tongue, or  throat Kidney injury--decrease in the amount of urine, swelling of the ankles, hands, or feet Low calcium level--muscle pain or cramps, confusion, tingling, or numbness in the hands or feet Osteonecrosis of the jaw--pain, swelling, or redness in the mouth, numbness of the jaw, poor healing after dental work, unusual discharge from the mouth, visible bones in the mouth Severe bone, joint, or muscle pain Side effects that usually do not require medical attention (report to your care team if they continue or are bothersome): Constipation Fatigue Fever Loss of appetite Nausea Stomach pain This list may not describe all possible side effects. Call your doctor for medical advice about side effects. You may report side effects to FDA at 1-800-FDA-1088. Where should I keep my medication? This medication is given in a hospital or clinic. It will not be stored at home. NOTE: This sheet is a summary. It may not cover all possible information. If you have questions about this medicine, talk to your doctor, pharmacist, or health care provider. Pneumococcal Conjugate Vaccine (Prevnar 13) Suspension for Injection What is this medication? PNEUMOCOCCAL VACCINE (NEU mo KOK al vak SEEN) is a vaccine used to prevent pneumococcus bacterial infections. These bacteria can cause serious infections like pneumonia, meningitis, and blood infections. This vaccine will lower your chance of getting pneumonia. If you do get pneumonia, it can make your symptoms milder and your illness shorter. This vaccine will not treat an infection and will not cause infection. This vaccine is recommended for infants and young children, adults with certain medical conditions, and adults 66 years or older. This medicine may be used for other purposes; ask your health care provider or pharmacist if you have questions. This medicine may be used for other purposes; ask your health care provider or pharmacist if you have questions. COMMON BRAND  NAME(S): Prevnar, Prevnar 13 What should I tell my care team before I take this medication? They need to know if you have any of these conditions: bleeding problems fever immune system problems an unusual or allergic reaction to pneumococcal vaccine, diphtheria toxoid, other vaccines, latex, other medicines, foods, dyes, or preservatives pregnant or trying to get pregnant breast-feeding How should I use this medication? This vaccine is for injection into a muscle. It is given by a health care professional. A copy of Vaccine Information Statements will be given before each vaccination. Read this sheet carefully each time. The sheet may change frequently. Talk to your pediatrician regarding the use of this medicine in children. While this drug may be prescribed for children as young as 23 weeks old for selected conditions, precautions do apply. Overdosage: If you think you have taken too much of this medicine contact a poison control center or emergency room at once. NOTE: This medicine is only for you. Do not share this medicine with others. Overdosage: If you think you have taken too much of this medicine contact a poison control center or emergency room at once. NOTE: This medicine is only for you. Do not share this medicine with others. What if I miss a dose? It is important not to miss your dose. Call your doctor or health care professional if you are unable to keep  an appointment. What may interact with this medication? medicines for cancer chemotherapy medicines that suppress your immune function steroid medicines like prednisone or cortisone This list may not describe all possible interactions. Give your health care provider a list of all the medicines, herbs, non-prescription drugs, or dietary supplements you use. Also tell them if you smoke, drink alcohol, or use illegal drugs. Some items may interact with your medicine. This list may not describe all possible interactions. Give your  health care provider a list of all the medicines, herbs, non-prescription drugs, or dietary supplements you use. Also tell them if you smoke, drink alcohol, or use illegal drugs. Some items may interact with your medicine. What should I watch for while using this medication? Mild fever and pain should go away in 3 days or less. Report any unusual symptoms to your doctor or health care professional. What side effects may I notice from receiving this medication? Side effects that you should report to your doctor or health care professional as soon as possible: allergic reactions like skin rash, itching or hives, swelling of the face, lips, or tongue breathing problems confused fast or irregular heartbeat fever over 102 degrees F seizures unusual bleeding or bruising unusual muscle weakness Side effects that usually do not require medical attention (report to your doctor or health care professional if they continue or are bothersome): aches and pains diarrhea fever of 102 degrees F or less headache irritable loss of appetite pain, tender at site where injected trouble sleeping This list may not describe all possible side effects. Call your doctor for medical advice about side effects. You may report side effects to FDA at 1-800-FDA-1088. This list may not describe all possible side effects. Call your doctor for medical advice about side effects. You may report side effects to FDA at 1-800-FDA-1088. Where should I keep my medication? This does not apply. This vaccine is given in a clinic, pharmacy, doctor's office, or other health care setting and will not be stored at home. NOTE: This sheet is a summary. It may not cover all possible information. If you have questions about this medicine, talk to your doctor, pharmacist, or health care provider. Diphtheria, Tetanus, Acellular Pertussis; Haemophilus; Poliovirus Vaccine What is this medication? DIPHTHERIA TOXOID, TETANUS TOXOID, ACELLULAR  PERTUSSIS VACCINE, DTaP; HAEMOPHILUS INFLUENZA TYPE B CONJUGATE VACCINE; INACTIVATED POLIOVIRUS VACCINE, IPV (dif THEER ee uh TOK soid, TET n [place] TOK soid, ey SEL yuh ler per TUS iss vak SEEN, DTaP; hem OFF fil [place] in floo En zuh tahyp B CON ju gate ed vak SEEN; in ak tuh vey ted poh lee oh vahy ruhs vak SEEN, IPV) is used to help prevent diphtheria, tetanus, pertussis, haemophilus influenza type b, and polio infections. This medicine may be used for other purposes; ask your health care provider or pharmacist if you have questions. This medicine may be used for other purposes; ask your health care provider or pharmacist if you have questions. COMMON BRAND NAME(S): Pentacel What should I tell my care team before I take this medication? They need to know if you have any of these conditions: blood disorders like hemophilia fever or infection immune system problems neurologic disease take medicines that treat or prevent blood clots seizures an unusual or allergic reaction to Diphtheria Toxoid, Tetanus Toxoid, Acellular Pertussis Vaccine, DTaP; Haemophilus influenzae type b Conjugate Vaccine; Inactivated Poliovirus Vaccine, IPV, other medicines, neomycin, polymyxin b, albumin, polysorbate 80, foods, dyes, or preservatives pregnant or trying to get pregnant breast-feeding How should I  use this medication? This vaccine is for injection into a muscle. It is given by a health care professional. A copy of Vaccine Information Statements will be given before each vaccination. Read this sheet carefully each time. The sheet may change frequently. Talk to your pediatrician regarding the use of this medicine in children. While this drug may be prescribed for children as young as 6 weeks for selected conditions, precautions do apply. Overdosage: If you think you have taken too much of this medicine contact a poison control center or emergency room at once. NOTE: This medicine is only for you. Do not share this  medicine with others. Overdosage: If you think you have taken too much of this medicine contact a poison control center or emergency room at once. NOTE: This medicine is only for you. Do not share this medicine with others. What if I miss a dose? Keep appointments for follow-up (booster) doses as directed. It is important not to miss your dose. Call your doctor or health care professional if you are unable to keep an appointment. What may interact with this medication? medicines that suppress your immune function like adalimumab, anakinra, infliximab, rilonacept medicines to treat cancer steroid medicines like prednisone or cortisone This list may not describe all possible interactions. Give your health care provider a list of all the medicines, herbs, non-prescription drugs, or dietary supplements you use. Also tell them if you smoke, drink alcohol, or use illegal drugs. Some items may interact with your medicine. This list may not describe all possible interactions. Give your health care provider a list of all the medicines, herbs, non-prescription drugs, or dietary supplements you use. Also tell them if you smoke, drink alcohol, or use illegal drugs. Some items may interact with your medicine. What should I watch for while using this medication? Contact your doctor or health care professional and get emergency medical care if any serious side effects occur. This vaccine, like all vaccines, may not fully protect everyone. What side effects may I notice from receiving this medication? Side effects that you should report to your doctor or health care professional as soon as possible: allergic reactions like skin rash, itching or hives, swelling of the face, lips, or tongue breathing problems fever over 103 degrees F inconsolable crying for 3 hours or more seizures unusually weak or tired Side effects that usually do not require medical attention (report to your doctor or health care  professional if they continue or are bothersome): bruising, pain, swelling at site where injected fussy loss of appetite low-grade fever sleepy vomiting This list may not describe all possible side effects. Call your doctor for medical advice about side effects. You may report side effects to FDA at 1-800-FDA-1088. This list may not describe all possible side effects. Call your doctor for medical advice about side effects. You may report side effects to FDA at 1-800-FDA-1088. Where should I keep my medication? This vaccine is only given in a clinic, pharmacy, doctor's office, or other health care setting and will not be stored at home. NOTE: This sheet is a summary. It may not cover all possible information. If you have questions about this medicine, talk to your doctor, pharmacist, or health care provider.

## 2021-09-26 ENCOUNTER — Other Ambulatory Visit: Payer: Self-pay | Admitting: Hematology

## 2021-09-27 ENCOUNTER — Other Ambulatory Visit: Payer: Self-pay | Admitting: Hematology

## 2021-09-29 ENCOUNTER — Encounter: Payer: Self-pay | Admitting: Hematology

## 2021-09-29 LAB — MULTIPLE MYELOMA PANEL, SERUM
Albumin SerPl Elph-Mcnc: 3.5 g/dL (ref 2.9–4.4)
Albumin/Glob SerPl: 1.2 (ref 0.7–1.7)
Alpha 1: 0.3 g/dL (ref 0.0–0.4)
Alpha2 Glob SerPl Elph-Mcnc: 0.8 g/dL (ref 0.4–1.0)
B-Globulin SerPl Elph-Mcnc: 1.1 g/dL (ref 0.7–1.3)
Gamma Glob SerPl Elph-Mcnc: 0.9 g/dL (ref 0.4–1.8)
Globulin, Total: 3.1 g/dL (ref 2.2–3.9)
IgA: 244 mg/dL (ref 87–352)
IgG (Immunoglobin G), Serum: 966 mg/dL (ref 586–1602)
IgM (Immunoglobulin M), Srm: 30 mg/dL (ref 26–217)
Total Protein ELP: 6.6 g/dL (ref 6.0–8.5)

## 2021-10-03 ENCOUNTER — Other Ambulatory Visit: Payer: Self-pay | Admitting: Hematology

## 2021-10-03 DIAGNOSIS — C9001 Multiple myeloma in remission: Secondary | ICD-10-CM

## 2021-10-06 ENCOUNTER — Other Ambulatory Visit: Payer: Self-pay | Admitting: Hematology

## 2021-10-07 ENCOUNTER — Encounter: Payer: Self-pay | Admitting: Hematology

## 2021-10-07 ENCOUNTER — Other Ambulatory Visit: Payer: Self-pay

## 2021-10-10 ENCOUNTER — Other Ambulatory Visit: Payer: Self-pay | Admitting: Hematology

## 2021-10-10 DIAGNOSIS — C7951 Secondary malignant neoplasm of bone: Secondary | ICD-10-CM

## 2021-10-14 ENCOUNTER — Other Ambulatory Visit: Payer: Self-pay | Admitting: *Deleted

## 2021-10-14 DIAGNOSIS — C7951 Secondary malignant neoplasm of bone: Secondary | ICD-10-CM

## 2021-10-15 ENCOUNTER — Inpatient Hospital Stay: Payer: 59

## 2021-10-15 ENCOUNTER — Other Ambulatory Visit: Payer: Self-pay

## 2021-10-15 DIAGNOSIS — C9001 Multiple myeloma in remission: Secondary | ICD-10-CM

## 2021-10-15 DIAGNOSIS — C9000 Multiple myeloma not having achieved remission: Secondary | ICD-10-CM | POA: Diagnosis not present

## 2021-10-15 DIAGNOSIS — C7951 Secondary malignant neoplasm of bone: Secondary | ICD-10-CM

## 2021-10-15 LAB — PREGNANCY, URINE: Preg Test, Ur: NEGATIVE

## 2021-10-15 MED ORDER — LENALIDOMIDE 10 MG PO CAPS: ORAL_CAPSULE | ORAL | 0 refills | Status: DC

## 2021-10-17 ENCOUNTER — Other Ambulatory Visit: Payer: Self-pay | Admitting: *Deleted

## 2021-10-17 ENCOUNTER — Other Ambulatory Visit: Payer: Self-pay

## 2021-10-17 MED ORDER — ACYCLOVIR 400 MG PO TABS: ORAL_TABLET | ORAL | 0 refills | Status: DC

## 2021-10-29 ENCOUNTER — Other Ambulatory Visit: Payer: Self-pay | Admitting: Hematology

## 2021-10-29 DIAGNOSIS — C9001 Multiple myeloma in remission: Secondary | ICD-10-CM

## 2021-10-31 ENCOUNTER — Encounter: Payer: Self-pay | Admitting: Hematology

## 2021-11-04 ENCOUNTER — Other Ambulatory Visit: Payer: Self-pay | Admitting: Hematology

## 2021-11-07 ENCOUNTER — Other Ambulatory Visit: Payer: Self-pay | Admitting: Hematology

## 2021-11-07 DIAGNOSIS — C9001 Multiple myeloma in remission: Secondary | ICD-10-CM

## 2021-11-14 ENCOUNTER — Other Ambulatory Visit: Payer: Self-pay | Admitting: Hematology

## 2021-11-14 DIAGNOSIS — C7951 Secondary malignant neoplasm of bone: Secondary | ICD-10-CM

## 2021-11-17 ENCOUNTER — Other Ambulatory Visit: Payer: Self-pay | Admitting: *Deleted

## 2021-11-17 DIAGNOSIS — C9000 Multiple myeloma not having achieved remission: Secondary | ICD-10-CM

## 2021-11-17 DIAGNOSIS — C7951 Secondary malignant neoplasm of bone: Secondary | ICD-10-CM

## 2021-11-18 ENCOUNTER — Encounter: Payer: Self-pay | Admitting: Hematology

## 2021-11-18 ENCOUNTER — Inpatient Hospital Stay: Payer: 59

## 2021-11-18 ENCOUNTER — Other Ambulatory Visit: Payer: Self-pay

## 2021-11-18 ENCOUNTER — Inpatient Hospital Stay: Payer: 59 | Admitting: Hematology

## 2021-11-18 ENCOUNTER — Inpatient Hospital Stay: Payer: 59 | Attending: Hematology

## 2021-11-18 VITALS — BP 114/59 | HR 76 | Temp 97.5°F | Resp 16 | Wt 253.2 lb

## 2021-11-18 DIAGNOSIS — C9001 Multiple myeloma in remission: Secondary | ICD-10-CM | POA: Diagnosis not present

## 2021-11-18 DIAGNOSIS — Z23 Encounter for immunization: Secondary | ICD-10-CM | POA: Insufficient documentation

## 2021-11-18 DIAGNOSIS — C9000 Multiple myeloma not having achieved remission: Secondary | ICD-10-CM

## 2021-11-18 DIAGNOSIS — C7951 Secondary malignant neoplasm of bone: Secondary | ICD-10-CM

## 2021-11-18 DIAGNOSIS — Z7189 Other specified counseling: Secondary | ICD-10-CM

## 2021-11-18 LAB — CBC WITH DIFFERENTIAL (CANCER CENTER ONLY)
Abs Immature Granulocytes: 0.02 10*3/uL (ref 0.00–0.07)
Basophils Absolute: 0 10*3/uL (ref 0.0–0.1)
Basophils Relative: 1 %
Eosinophils Absolute: 0.5 10*3/uL (ref 0.0–0.5)
Eosinophils Relative: 10 %
HCT: 38 % (ref 36.0–46.0)
Hemoglobin: 12.5 g/dL (ref 12.0–15.0)
Immature Granulocytes: 0 %
Lymphocytes Relative: 15 %
Lymphs Abs: 0.7 10*3/uL (ref 0.7–4.0)
MCH: 31.4 pg (ref 26.0–34.0)
MCHC: 32.9 g/dL (ref 30.0–36.0)
MCV: 95.5 fL (ref 80.0–100.0)
Monocytes Absolute: 0.5 10*3/uL (ref 0.1–1.0)
Monocytes Relative: 10 %
Neutro Abs: 3.2 10*3/uL (ref 1.7–7.7)
Neutrophils Relative %: 64 %
Platelet Count: 214 10*3/uL (ref 150–400)
RBC: 3.98 MIL/uL (ref 3.87–5.11)
RDW: 16.9 % — ABNORMAL HIGH (ref 11.5–15.5)
WBC Count: 4.9 10*3/uL (ref 4.0–10.5)
nRBC: 0 % (ref 0.0–0.2)

## 2021-11-18 LAB — PREGNANCY, URINE: Preg Test, Ur: NEGATIVE

## 2021-11-18 LAB — CMP (CANCER CENTER ONLY)
ALT: 86 U/L — ABNORMAL HIGH (ref 0–44)
AST: 61 U/L — ABNORMAL HIGH (ref 15–41)
Albumin: 3.9 g/dL (ref 3.5–5.0)
Alkaline Phosphatase: 69 U/L (ref 38–126)
Anion gap: 7 (ref 5–15)
BUN: 12 mg/dL (ref 6–20)
CO2: 26 mmol/L (ref 22–32)
Calcium: 8.9 mg/dL (ref 8.9–10.3)
Chloride: 104 mmol/L (ref 98–111)
Creatinine: 1.05 mg/dL — ABNORMAL HIGH (ref 0.44–1.00)
GFR, Estimated: >60 mL/min (ref >60)
Glucose, Bld: 109 mg/dL — ABNORMAL HIGH (ref 70–99)
Potassium: 3.8 mmol/L (ref 3.5–5.1)
Sodium: 137 mmol/L (ref 135–145)
Total Bilirubin: 0.5 mg/dL (ref 0.3–1.2)
Total Protein: 6.8 g/dL (ref 6.5–8.1)

## 2021-11-18 LAB — MAGNESIUM: Magnesium: 1.6 mg/dL — ABNORMAL LOW (ref 1.7–2.4)

## 2021-11-18 MED ORDER — PNEUMOCOCCAL VAC POLYVALENT 25 MCG/0.5ML IJ INJ
0.5000 mL | INJECTION | Freq: Once | INTRAMUSCULAR | Status: AC
  Administered 2021-11-18: 0.5 mL via INTRAMUSCULAR
  Filled 2021-11-18: qty 0.5

## 2021-11-18 MED ORDER — ZOLEDRONIC ACID 4 MG/100ML IV SOLN
4.0000 mg | Freq: Once | INTRAVENOUS | Status: AC
  Administered 2021-11-18: 4 mg via INTRAVENOUS
  Filled 2021-11-18: qty 100

## 2021-11-18 MED ORDER — HEPATITIS B VAC RECOMBINANT 20 MCG/ML IJ SUSP
2.0000 mL | Freq: Once | INTRAMUSCULAR | Status: AC
  Administered 2021-11-18: 40 ug via INTRAMUSCULAR
  Filled 2021-11-18: qty 2

## 2021-11-18 MED ORDER — SODIUM CHLORIDE 0.9 % IV SOLN: Freq: Once | INTRAVENOUS | Status: AC

## 2021-11-18 NOTE — Progress Notes (Signed)
HEMATOLOGY/ONCOLOGY CLINIC NOTE  Date of Service: 11/18/2021  Patient Care Team: Celene Squibb, MD as PCP - General (Internal Medicine) Corrie Mckusick, DO as Consulting Physician (Interventional Radiology)  CHIEF COMPLAINTS/PURPOSE OF CONSULTATION:  Follow-up for continued evaluation management of myeloma  HISTORY OF PRESENTING ILLNESS:  Please see previous note for HPI.  INTERVAL HISTORY: Stacey Montoya is a 46 y.o. female here for continued valuation and management of multiple myeloma. She reports She is doing well with no new symptoms or concerns.  She notes that she was not tolerating the Cymbalta well as she was experiencing warm tight bumps on her upper arms b/l with a "spacey" light headed feeling. She reports that the symptoms resolved quickly after stopping Cymbalta.  We discussed getting the new COVID vaccine. She expresses some hesitancy in pursuing that at this time. I gave her information regarding vaccines and the best recommendations with her health and immunosuppression in consideration.  We discussed that if she gets any sort of infection then we will have to hold her Revlimid.  She notes abdominal discomfort and midday diarrhea after taking her PEA. She notes that she does not take it on an empty stomach. We discussed trying to take it at night instead of the morning which she is agreeable to.  She notes back discomfort from her spinal stenosis. We discussed f/u with Dr. Kathyrn Sheriff for neurosurgery consult regarding further evaluation of her spinal stenosis.  She notes weakness in left leg when trying to lift it up.  She notes she is eating well.  She reports that she has stopped smoking cigarettes but still vapes. We discussed the goal of complete smoking cessation and she notes that she is actively trying to stop.  She notes that she is tolerating her maintenance Revlimid well without any acute issues.  No other new or acute focal symptoms.  Labs done  today were reviewed in detail with the patient.  MEDICAL HISTORY:  Past Medical History:  Diagnosis Date   Abnormal Pap smear of cervix    Back pain    Cancer ([place]) 04/02/2020   multiple myeloma   Hyperlipidemia    Hypertension     SURGICAL HISTORY: Past Surgical History:  Procedure Laterality Date   CERVICAL CONIZATION W/BX  12/02/2010   Procedure: CONIZATION CERVIX WITH BIOPSY;  Surgeon: Arloa Koh;  Location: [facility];  Service: Gynecology;  Laterality: N/A;  COLD KNIFE   CESAREAN SECTION     DILATION AND CURETTAGE OF UTERUS  12/02/2010   Procedure: DILATATION AND CURETTAGE (D&C);  Surgeon: Arloa Koh;  Location: [facility];  Service: Gynecology;  Laterality: N/A;   IR RADIOLOGIST EVAL & MGMT  04/16/2020   KYPHOPLASTY Bilateral 09/05/2020   Procedure: KYPHOPLASTY THORACIC TWELVE AND LUMBAR FOUR;  Surgeon: Consuella Lose, MD;  Location: [facility];  Service: Neurosurgery;  Laterality: Bilateral;   left hand     drain - infection   WISDOM TOOTH EXTRACTION      SOCIAL HISTORY: Social History   Socioeconomic History   Marital status: Married    Spouse name: Not on file   Number of children: Not on file   Years of education: Not on file   Highest education level: Not on file  Occupational History   Not on file  Tobacco Use   Smoking status: Former    Packs/day: 0.50    Years: 20.00    Total pack years: 10.00    Types: Cigarettes   Smokeless tobacco:  Never  Vaping Use   Vaping Use: Some days  Substance and Sexual Activity   Alcohol use: Not Currently   Drug use: Not Currently   Sexual activity: Not Currently    Partners: Male    Birth control/protection: Condom  Other Topics Concern   Not on file  Social History Narrative   ** Merged History Encounter **       Social Determinants of Health   Financial Resource Strain: Not on file  Food Insecurity: Not on file  Transportation Needs: Not on file  Physical Activity: Not on file  Stress: Not on file  Social  Connections: Not on file  Intimate Partner Violence: Not on file    FAMILY HISTORY: Family History  Problem Relation Age of Onset   Lung cancer Maternal Grandfather    Pancreatic cancer Paternal Grandfather     ALLERGIES:  is allergic to zithromax [azithromycin].  MEDICATIONS:  Current Outpatient Medications  Medication Sig Dispense Refill   acyclovir (ZOVIRAX) 400 MG tablet TAKE (1) TABLET BY MOUTH TWICE DAILY. 60 tablet 0   albuterol (VENTOLIN HFA) 108 (90 Base) MCG/ACT inhaler Inhale 1-2 puffs into the lungs every 6 (six) hours as needed for wheezing.     amLODipine (NORVASC) 5 MG tablet Take 1 tablet (5 mg total) by mouth daily. 30 tablet 0   b complex vitamins capsule Take 1 capsule by mouth daily. 30 capsule    ELIQUIS 5 MG TABS tablet TAKE 1 TABLET BY MOUTH TWICE DAILY. 60 tablet 0   ergocalciferol (VITAMIN D2) 1.25 MG (50000 UT) capsule Take 1 capsule (50,000 Units total) by mouth 2 (two) times a week. 12 capsule 2   folic acid (FOLVITE) 1 MG tablet TAKE 1 TABLET BY MOUTH ONCE A DAY. 90 tablet 0   furosemide (LASIX) 20 MG tablet TAKE 3 TABLETS BY MOUTH DAILY. 90 tablet 0   gabapentin (NEURONTIN) 300 MG capsule Take 327m in the morning, 600 mg in the afternoon and 9021mat bedtime. 180 capsule 0   lenalidomide (REVLIMID) 10 MG capsule TAKE 1 CAPSULE BY MOUTH DAILY  FOR 21 DAYS AS DIRECTED 21 capsule 0   medroxyPROGESTERone (PROVERA) 10 MG tablet Take 10 mg by mouth daily.     metoprolol tartrate (LOPRESSOR) 50 MG tablet Take 1 tablet (50 mg total) by mouth 2 (two) times daily. 60 tablet 0   nicotine (NICODERM CQ - DOSED IN MG/24 HOURS) 21 mg/24hr patch Place 1 patch (21 mg total) onto the skin daily. 28 patch 0   ondansetron (ZOFRAN) 4 MG tablet Take 8 mg by mouth every 8 (eight) hours.     oxyCODONE (OXY IR/ROXICODONE) 5 MG immediate release tablet TAKE 1 TABLET BY MOUTH EVERY 6 HOURS AS NEEDED. 60 tablet 0   polyethylene glycol (MIRALAX / GLYCOLAX) 17 g packet Take 17 g by  mouth daily as needed for mild constipation.     potassium chloride SA (KLOR-CON M) 20 MEQ tablet TAKE (1) TABLET BY MOUTH TWICE DAILY. 60 tablet 0   pravastatin (PRAVACHOL) 40 MG tablet Take 40 mg by mouth daily.     prochlorperazine (COMPAZINE) 10 MG tablet Take 1 tablet (10 mg total) by mouth every 6 (six) hours as needed for nausea or vomiting. 30 tablet 2   DULoxetine (CYMBALTA) 30 MG capsule Take 1 capsule (30 mg total) by mouth daily. (Patient not taking: Reported on 09/23/2021) 30 capsule 3   magic mouthwash w/lidocaine SOLN Take 5 mLs by mouth 3 (three) times daily  as needed for mouth pain. Dispense 445m of magic mouthwash compounded preparation. Patient to use 54m4 times daily as needed for throat discomfort. 80 ml viscous lidocaine 2% 80 ml Mylanta 80 ml diphenhydramine at 12.5 mg per 5 ml elixir 80 ml nystatin at 100,000U per 5 mL suspension 80 ml distilled water (Patient not taking: Reported on 11/18/2021)     No current facility-administered medications for this visit.    REVIEW OF SYSTEMS:   . 10 Point review of Systems was done is negative except as noted above.   PHYSICAL EXAMINATION: ECOG PERFORMANCE STATUS: 2 - Symptomatic, <50% confined to bed .BP (!) 114/59 (BP Location: Left Arm, Patient Position: Sitting) Comment: Nurse was notify  Pulse 76   Temp (!) 97.5 F (36.4 C) (Temporal)   Resp 16   Wt 253 lb 3.2 oz (114.9 kg)   SpO2 98%   BMI 43.46 kg/m  NAD GENERAL:alert, in no acute distress and comfortable SKIN: no acute rashes, no significant lesions EYES: conjunctiva are pink and non-injected, sclera anicteric NECK: supple, no JVD LYMPH:  no palpable lymphadenopathy in the cervical, axillary or inguinal regions LUNGS: clear to auscultation b/l with normal respiratory effort HEART: regular rate & rhythm ABDOMEN:  normoactive bowel sounds , non tender, not distended. Extremity: no pedal edema PSYCH: alert & oriented x 3 with fluent speech NEURO: no focal  motor/sensory deficits  Exam performed in chair.  LABORATORY DATA:  I have reviewed the data as listed  .. Marland Kitchen  Latest Ref Rng & Units 11/18/2021    9:56 AM 09/23/2021    9:48 AM 08/20/2021   11:21 AM  CBC  WBC 4.0 - 10.5 K/uL 4.9  5.3  6.7   Hemoglobin 12.0 - 15.0 g/dL 12.5  12.0  12.5   Hematocrit 36.0 - 46.0 % 38.0  36.4  38.0   Platelets 150 - 400 K/uL 214  193  290     .    Latest Ref Rng & Units 09/23/2021    9:48 AM 08/20/2021   11:21 AM 07/22/2021    9:42 AM  CMP  Glucose 70 - 99 mg/dL 111  115  120   BUN 6 - 20 mg/dL 13  19  16    Creatinine 0.44 - 1.00 mg/dL 0.94  1.30  0.94   Sodium 135 - 145 mmol/L 137  136  138   Potassium 3.5 - 5.1 mmol/L 3.5  3.8  3.5   Chloride 98 - 111 mmol/L 101  99  102   CO2 22 - 32 mmol/L 28  28  26    Calcium 8.9 - 10.3 mg/dL 9.2  10.3  9.7   Total Protein 6.5 - 8.1 g/dL 7.4  8.4  7.2   Total Bilirubin 0.3 - 1.2 mg/dL 0.4  0.4  0.7   Alkaline Phos 38 - 126 U/L 57  79  56   AST 15 - 41 U/L 21  27  20    ALT 0 - 44 U/L 32  43  36       RADIOGRAPHIC STUDIES: I have personally reviewed the radiological images as listed and agreed with the findings in the report. No results found.   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  2) h/o hypercalcemia due to multiple myeloma-now resolved with IV fluids, calcitonin, pamidronate. 3) h/o anemia due to multiple myeloma becoming more apparent as the patient's hemoconcentration due to dehydration has been corrected.   4) h/o acute renal failure related to dehydration hypercalcemia and multiple myeloma.  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  PLAN: -Labs done today were reviewed in detail with the patient. CBC and CMP are stable. Myeloma panel today pending. Magnesium is 2.3. -Patient notes no notable toxicities from her maintenance Revlimid and will continue maintenance Revlimid at this time at 10 mg 3 weeks on 1 week off. -Continue Eliquis. -She has stopped Fentanyl patch 12.5 mcg/h. She will only use as needed. -Continuing as needed oxycodone -Start Palmitoylethanolamide over the counter supplement. -We discussed getting the new COVID vaccine. She expresses some hesitancy in pursuing that at this time. I gave her information regarding vaccines and the best recommendations with her health and immunosuppression in consideration. -She notes she vapes rather smoking cigarettes. We discussed the goal of complete smoking cessation. -She will f/u with Dr. Kathyrn Sheriff for neurosurgery consult regarding further evaluation of her spinal stenosis. -We discussed that if she gets any sort of infection then we will have to hold her Revlimid. -She was advised to take PEA at night instead of the morning to hopefully alleviate abdominal discomfort and diarrhea. -Discontinued Cymbalta as she was not tolerating it well.   FOLLOW UP: Please schedule next 3 treatments Zometa every 2 months with labs Return to clinic with Dr. Irene Limbo with labs in 2 months  .The total time spent in the appointment was 32 minutes* .  All of the patient's questions were answered with apparent satisfaction. The patient knows to call the clinic with any problems, questions or concerns.   Stacey Lone MD MS AAHIVMS [facility] Hematology/Oncology Physician [facility]  .*Total  Encounter Time as defined by the Centers for Medicare and Medicaid Services includes, in addition to the face-to-face time of a patient visit (documented in the note above) non-face-to-face time: obtaining and reviewing outside history, ordering and reviewing medications, tests or procedures, care coordination (communications with other health care professionals or caregivers) and documentation in the medical record.  I, Melene Muller, am acting as scribe for Dr. Sullivan Lone, MD.  .I have reviewed the above documentation for accuracy and completeness, and I agree with the above. Brunetta Genera MD

## 2021-11-18 NOTE — Patient Instructions (Signed)
Zoledronic Acid Injection (Cancer) What is this medication? ZOLEDRONIC ACID (ZOE le dron ik AS id) treats high calcium levels in the blood caused by cancer. It may also be used with chemotherapy to treat weakened bones caused by cancer. It works by slowing down the release of calcium from bones. This lowers calcium levels in your blood. It also makes your bones stronger and less likely to break (fracture). It belongs to a group of medications called bisphosphonates. This medicine may be used for other purposes; ask your health care provider or pharmacist if you have questions. COMMON BRAND NAME(S): Zometa, Zometa Powder What should I tell my care team before I take this medication? They need to know if you have any of these conditions: Dehydration Dental disease Kidney disease Liver disease Low levels of calcium in the blood Lung or breathing disease, such as asthma Receiving steroids, such as dexamethasone or prednisone An unusual or allergic reaction to zoledronic acid, other medications, foods, dyes, or preservatives Pregnant or trying to get pregnant Breast-feeding How should I use this medication? This medication is injected into a vein. It is given by your care team in a hospital or clinic setting. Talk to your care team about the use of this medication in children. Special care may be needed. Overdosage: If you think you have taken too much of this medicine contact a poison control center or emergency room at once. NOTE: This medicine is only for you. Do not share this medicine with others. What if I miss a dose? Keep appointments for follow-up doses. It is important not to miss your dose. Call your care team if you are unable to keep an appointment. What may interact with this medication? Certain antibiotics given by injection Diuretics, such as bumetanide, furosemide NSAIDs, medications for pain and inflammation, such as ibuprofen or naproxen Teriparatide Thalidomide This list  may not describe all possible interactions. Give your health care provider a list of all the medicines, herbs, non-prescription drugs, or dietary supplements you use. Also tell them if you smoke, drink alcohol, or use illegal drugs. Some items may interact with your medicine. What should I watch for while using this medication? Visit your care team for regular checks on your progress. It may be some time before you see the benefit from this medication. Some people who take this medication have severe bone, joint, or muscle pain. This medication may also increase your risk for jaw problems or a broken thigh bone. Tell your care team right away if you have severe pain in your jaw, bones, joints, or muscles. Tell you care team if you have any pain that does not go away or that gets worse. Tell your dentist and dental surgeon that you are taking this medication. You should not have major dental surgery while on this medication. See your dentist to have a dental exam and fix any dental problems before starting this medication. Take good care of your teeth while on this medication. Make sure you see your dentist for regular follow-up appointments. You should make sure you get enough calcium and vitamin D while you are taking this medication. Discuss the foods you eat and the vitamins you take with your care team. Check with your care team if you have severe diarrhea, nausea, and vomiting, or if you sweat a lot. The loss of too much body fluid may make it dangerous for you to take this medication. You may need bloodwork while taking this medication. Talk to your care team if   you wish to become pregnant or think you might be pregnant. This medication can cause serious birth defects. What side effects may I notice from receiving this medication? Side effects that you should report to your care team as soon as possible: Allergic reactions--skin rash, itching, hives, swelling of the face, lips, tongue, or  throat Kidney injury--decrease in the amount of urine, swelling of the ankles, hands, or feet Low calcium level--muscle pain or cramps, confusion, tingling, or numbness in the hands or feet Osteonecrosis of the jaw--pain, swelling, or redness in the mouth, numbness of the jaw, poor healing after dental work, unusual discharge from the mouth, visible bones in the mouth Severe bone, joint, or muscle pain Side effects that usually do not require medical attention (report to your care team if they continue or are bothersome): Constipation Fatigue Fever Loss of appetite Nausea Stomach pain This list may not describe all possible side effects. Call your doctor for medical advice about side effects. You may report side effects to FDA at 1-800-FDA-1088. Where should I keep my medication? This medication is given in a hospital or clinic. It will not be stored at home. NOTE: This sheet is a summary. It may not cover all possible information. If you have questions about this medicine, talk to your doctor, pharmacist, or health care provider.  2023 Elsevier/Gold Standard (2021-03-20 00:00:00)  Hepatitis B Vaccine: What You Need to Know 1. Why get vaccinated? Hepatitis B vaccine can prevent hepatitis B. Hepatitis B is a liver disease that can cause mild illness lasting a few weeks, or it can lead to a serious, lifelong illness. Acute hepatitis B infection is a short-term illness that can lead to fever, fatigue, loss of appetite, nausea, vomiting, jaundice (yellow skin or eyes, dark urine, clay-colored bowel movements), and pain in the muscles, joints, and stomach. Chronic hepatitis B infection is a long-term illness that occurs when the hepatitis B virus remains in a person's body. Most people who go on to develop chronic hepatitis B do not have symptoms, but it is still very serious and can lead to liver damage (cirrhosis), liver cancer, and death. Chronically infected people can spread hepatitis B virus  to others, even if they do not feel or look sick themselves. Hepatitis B is spread when blood, semen, or other body fluid infected with the hepatitis B virus enters the body of a person who is not infected. People can become infected through: Birth (if a pregnant person has hepatitis B, their baby can become infected) Sharing items such as razors or toothbrushes with an infected person Contact with the blood or open sores of an infected person Sex with an infected partner Sharing needles, syringes, or other drug-injection equipment Exposure to blood from needlesticks or other sharp instruments Most people who are vaccinated with hepatitis B vaccine are immune for life. 2. Hepatitis B vaccine Hepatitis B vaccine is usually given as 2, 3, or 4 shots. Infants should get their first dose of hepatitis B vaccine at birth and will usually complete the series at 88-29 months of age. The birth dose of hepatitis B vaccine is an important part of preventing long-term illness in infants and the spread of hepatitis B in the [place]. Children and adolescents younger than 26 years of age who have not yet gotten the vaccine should be vaccinated. Adults who were not vaccinated previously and want to be protected against hepatitis B can also get the vaccine. Hepatitis B vaccine is also recommended for the following  people: People whose sex partners have hepatitis B Sexually active persons who are not in a long-term, monogamous relationship People seeking evaluation or treatment for a sexually transmitted disease Victims of sexual assault or abuse Men who have sexual contact with other men People who share needles, syringes, or other drug-injection equipment People who live with someone infected with the hepatitis B virus Health care and public safety workers at risk for exposure to blood or body fluids Residents and staff of facilities for developmentally disabled people People living in jail or  prison Travelers to regions with increased rates of hepatitis B People with chronic liver disease, kidney disease on dialysis, HIV infection, infection with hepatitis C, or diabetes Hepatitis B vaccine may be given as a stand-alone vaccine, or as part of a combination vaccine (a type of vaccine that combines more than one vaccine together into one shot). Hepatitis B vaccine may be given at the same time as other vaccines. 3. Talk with your health care provider Tell your vaccination provider if the person getting the vaccine: Has had an allergic reaction after a previous dose of hepatitis B vaccine, or has any severe, life-threatening allergies In some cases, your health care provider may decide to postpone hepatitis B vaccination until a future visit. Pregnant or breastfeeding people should be vaccinated if they are at risk for getting hepatitis B. Pregnancy or breastfeeding are not reasons to avoid hepatitis B vaccination. People with minor illnesses, such as a cold, may be vaccinated. People who are moderately or severely ill should usually wait until they recover before getting hepatitis B vaccine. Your health care provider can give you more information. 4. Risks of a vaccine reaction Soreness where the shot is given or fever can happen after hepatitis B vaccination. People sometimes faint after medical procedures, including vaccination. Tell your provider if you feel dizzy or have vision changes or ringing in the ears. As with any medicine, there is a very remote chance of a vaccine causing a severe allergic reaction, other serious injury, or death. 5. What if there is a serious problem? An allergic reaction could occur after the vaccinated person leaves the clinic. If you see signs of a severe allergic reaction (hives, swelling of the face and throat, difficulty breathing, a fast heartbeat, dizziness, or weakness), call 9-1-1 and get the person to the nearest hospital. For other signs that  concern you, call your health care provider. Adverse reactions should be reported to the Vaccine Adverse Event Reporting System (VAERS). Your health care provider will usually file this report, or you can do it yourself. Visit the VAERS website at www.vaers.SamedayNews.es or call 702-160-3859.VAERS is only for reporting reactions, and VAERS staff members do not give medical advice. 6. The National Vaccine Injury Compensation Program The [employer] Vaccine Injury Compensation Program (VICP) is a federal program that was created to compensate people who may have been injured by certain vaccines. Claims regarding alleged injury or death due to vaccination have a time limit for filing, which may be as short as two years. Visit the VICP website at GoldCloset.com.ee or call 979-617-1763 to learn about the program and about filing a claim. 7. How can I learn more? Ask your health care provider. Call your local or state health department. Visit the website of the Food and Drug Administration (FDA) for vaccine package inserts and additional information at TraderRating.uy. Contact the Centers for Disease Control and Prevention (CDC): Call 303-759-3088 (1-800-CDC-INFO) or Visit CDC's website at http://hunter.com/. Source: [employer] Vaccine Information  Statement Hepatitis B Vaccine (12/01/2019) This same material is available at http://www.wolf.info/ for no charge. This information is not intended to replace advice given to you by your health care provider. Make sure you discuss any questions you have with your health care provider. Document Revised: 01/01/2021 Document Reviewed: 10/24/2020 Elsevier Patient Education  [place].  Pneumococcal Polysaccharide Vaccine (PPSV23): What You Need to Know 1. Why get vaccinated? Pneumococcal polysaccharide vaccine (PPSV23) can prevent pneumococcal disease. Pneumococcal disease refers to any illness caused by pneumococcal bacteria.  These bacteria can cause many types of illnesses, including pneumonia, which is an infection of the lungs. Pneumococcal bacteria are one of the most common causes of pneumonia. Besides pneumonia, pneumococcal bacteria can also cause: Ear infections Sinus infections Meningitis (infection of the tissue covering the brain and spinal cord) Bacteremia (bloodstream infection) Anyone can get pneumococcal disease, but children under 39 years of age, people with certain medical conditions, adults 37 years or older, and cigarette smokers are at the highest risk. Most pneumococcal infections are mild. However, some can result in long-term problems, such as brain damage or hearing loss. Meningitis, bacteremia, and pneumonia caused by pneumococcal disease can be fatal. 2. PPSV23 PPSV23 protects against 23 types of bacteria that cause pneumococcal disease. PPSV23 is recommended for: All adults 52 years or older, Anyone 2 years or older with certain medical conditions that can lead to an increased risk for pneumococcal disease. Most people need only one dose of PPSV23. A second dose of PPSV23, and another type of pneumococcal vaccine called PCV13, are recommended for certain high-risk groups. Your health care provider can give you more information. People 65 years or older should get a dose of PPSV23 even if they have already gotten one or more doses of the vaccine before they turned 16. 3. Talk with your health care provider Tell your vaccine provider if the person getting the vaccine: Has had an allergic reaction after a previous dose of PPSV23, or has any severe, life-threatening allergies. In some cases, your health care provider may decide to postpone PPSV23 vaccination to a future visit. People with minor illnesses, such as a cold, may be vaccinated. People who are moderately or severely ill should usually wait until they recover before getting PPSV23. Your health care provider can give you more  information. 4. Risks of a vaccine reaction Redness or pain where the shot is given, feeling tired, fever, or muscle aches can happen after PPSV23. People sometimes faint after medical procedures, including vaccination. Tell your provider if you feel dizzy or have vision changes or ringing in the ears. As with any medicine, there is a very remote chance of a vaccine causing a severe allergic reaction, other serious injury, or death. 5. What if there is a serious problem? An allergic reaction could occur after the vaccinated person leaves the clinic. If you see signs of a severe allergic reaction (hives, swelling of the face and throat, difficulty breathing, a fast heartbeat, dizziness, or weakness), call 9-1-1 and get the person to the nearest hospital. For other signs that concern you, call your health care provider. Adverse reactions should be reported to the Vaccine Adverse Event Reporting System (VAERS). Your health care provider will usually file this report, or you can do it yourself. Visit the VAERS website at www.vaers.SamedayNews.es or call 708-311-0073. VAERS is only for reporting reactions, and VAERS staff do not give medical advice. 6. How can I learn more? Ask your health care provider. Call your local or state  health department. Contact the Centers for Disease Control and Prevention (CDC): Call 9050868388 (1-800-CDC-INFO) or Visit CDC's website at http://hunter.com/ Source: CDC Vaccine Information Statement PPSV23 Vaccine (12/15/2017) This same material is available at http://www.wolf.info/ for no charge. This information is not intended to replace advice given to you by your health care provider. Make sure you discuss any questions you have with your health care provider. Document Revised: 01/01/2021 Document Reviewed: 11/04/2020 Elsevier Patient Education  [place].

## 2021-11-24 ENCOUNTER — Encounter: Payer: Self-pay | Admitting: Hematology

## 2021-11-24 ENCOUNTER — Other Ambulatory Visit: Payer: Self-pay | Admitting: Hematology

## 2021-11-24 DIAGNOSIS — C9001 Multiple myeloma in remission: Secondary | ICD-10-CM

## 2021-11-24 LAB — MULTIPLE MYELOMA PANEL, SERUM
Albumin SerPl Elph-Mcnc: 3.4 g/dL (ref 2.9–4.4)
Albumin/Glob SerPl: 1.1 (ref 0.7–1.7)
Alpha 1: 0.3 g/dL (ref 0.0–0.4)
Alpha2 Glob SerPl Elph-Mcnc: 0.8 g/dL (ref 0.4–1.0)
B-Globulin SerPl Elph-Mcnc: 1.2 g/dL (ref 0.7–1.3)
Gamma Glob SerPl Elph-Mcnc: 1 g/dL (ref 0.4–1.8)
Globulin, Total: 3.3 g/dL (ref 2.2–3.9)
IgA: 283 mg/dL (ref 87–352)
IgG (Immunoglobin G), Serum: 1086 mg/dL (ref 586–1602)
IgM (Immunoglobulin M), Srm: 16 mg/dL — ABNORMAL LOW (ref 26–217)
Total Protein ELP: 6.7 g/dL (ref 6.0–8.5)

## 2021-12-02 ENCOUNTER — Other Ambulatory Visit: Payer: Self-pay

## 2021-12-03 ENCOUNTER — Other Ambulatory Visit: Payer: Self-pay | Admitting: Hematology

## 2021-12-10 ENCOUNTER — Other Ambulatory Visit: Payer: Self-pay | Admitting: Hematology

## 2021-12-10 DIAGNOSIS — C7951 Secondary malignant neoplasm of bone: Secondary | ICD-10-CM

## 2021-12-13 ENCOUNTER — Other Ambulatory Visit: Payer: Self-pay | Admitting: Hematology

## 2021-12-13 DIAGNOSIS — C9001 Multiple myeloma in remission: Secondary | ICD-10-CM

## 2021-12-16 ENCOUNTER — Other Ambulatory Visit: Payer: Self-pay

## 2021-12-16 ENCOUNTER — Inpatient Hospital Stay: Payer: 59

## 2021-12-16 ENCOUNTER — Other Ambulatory Visit: Payer: Self-pay | Admitting: Hematology

## 2021-12-16 ENCOUNTER — Encounter: Payer: Self-pay | Admitting: Hematology

## 2021-12-16 DIAGNOSIS — C9001 Multiple myeloma in remission: Secondary | ICD-10-CM

## 2021-12-16 DIAGNOSIS — C9000 Multiple myeloma not having achieved remission: Secondary | ICD-10-CM | POA: Diagnosis not present

## 2021-12-16 LAB — PREGNANCY, URINE: Preg Test, Ur: NEGATIVE

## 2021-12-23 ENCOUNTER — Other Ambulatory Visit: Payer: Self-pay

## 2021-12-25 ENCOUNTER — Other Ambulatory Visit: Payer: Self-pay | Admitting: Hematology

## 2021-12-30 ENCOUNTER — Other Ambulatory Visit: Payer: Self-pay | Admitting: Hematology

## 2021-12-31 ENCOUNTER — Other Ambulatory Visit: Payer: Self-pay

## 2022-01-05 ENCOUNTER — Other Ambulatory Visit: Payer: Self-pay | Admitting: Hematology

## 2022-01-05 DIAGNOSIS — C9001 Multiple myeloma in remission: Secondary | ICD-10-CM

## 2022-01-06 ENCOUNTER — Other Ambulatory Visit: Payer: Self-pay | Admitting: Hematology

## 2022-01-10 ENCOUNTER — Other Ambulatory Visit: Payer: Self-pay | Admitting: Hematology

## 2022-01-10 DIAGNOSIS — C9001 Multiple myeloma in remission: Secondary | ICD-10-CM

## 2022-01-12 ENCOUNTER — Other Ambulatory Visit: Payer: Self-pay

## 2022-01-12 DIAGNOSIS — C9001 Multiple myeloma in remission: Secondary | ICD-10-CM

## 2022-01-13 ENCOUNTER — Other Ambulatory Visit: Payer: Self-pay

## 2022-01-13 ENCOUNTER — Inpatient Hospital Stay: Payer: 59 | Attending: Hematology

## 2022-01-13 ENCOUNTER — Inpatient Hospital Stay: Payer: 59 | Admitting: Hematology

## 2022-01-13 ENCOUNTER — Ambulatory Visit: Payer: 59

## 2022-01-13 ENCOUNTER — Inpatient Hospital Stay: Payer: 59

## 2022-01-13 ENCOUNTER — Encounter: Payer: Self-pay | Admitting: Hematology

## 2022-01-13 ENCOUNTER — Other Ambulatory Visit: Payer: 59

## 2022-01-13 VITALS — BP 119/67 | HR 89 | Temp 98.1°F | Resp 18 | Ht 64.0 in | Wt 253.9 lb

## 2022-01-13 DIAGNOSIS — C9000 Multiple myeloma not having achieved remission: Secondary | ICD-10-CM

## 2022-01-13 DIAGNOSIS — C7951 Secondary malignant neoplasm of bone: Secondary | ICD-10-CM

## 2022-01-13 DIAGNOSIS — C9001 Multiple myeloma in remission: Secondary | ICD-10-CM

## 2022-01-13 LAB — CBC WITH DIFFERENTIAL (CANCER CENTER ONLY)
Abs Immature Granulocytes: 0.02 10*3/uL (ref 0.00–0.07)
Basophils Absolute: 0 10*3/uL (ref 0.0–0.1)
Basophils Relative: 1 %
Eosinophils Absolute: 0.4 10*3/uL (ref 0.0–0.5)
Eosinophils Relative: 7 %
HCT: 37.7 % (ref 36.0–46.0)
Hemoglobin: 12.3 g/dL (ref 12.0–15.0)
Immature Granulocytes: 0 %
Lymphocytes Relative: 17 %
Lymphs Abs: 0.9 10*3/uL (ref 0.7–4.0)
MCH: 31.3 pg (ref 26.0–34.0)
MCHC: 32.6 g/dL (ref 30.0–36.0)
MCV: 95.9 fL (ref 80.0–100.0)
Monocytes Absolute: 0.4 10*3/uL (ref 0.1–1.0)
Monocytes Relative: 8 %
Neutro Abs: 3.5 10*3/uL (ref 1.7–7.7)
Neutrophils Relative %: 67 %
Platelet Count: 202 10*3/uL (ref 150–400)
RBC: 3.93 MIL/uL (ref 3.87–5.11)
RDW: 16.9 % — ABNORMAL HIGH (ref 11.5–15.5)
WBC Count: 5.3 10*3/uL (ref 4.0–10.5)
nRBC: 0 % (ref 0.0–0.2)

## 2022-01-13 LAB — CMP (CANCER CENTER ONLY)
ALT: 40 U/L (ref 0–44)
AST: 17 U/L (ref 15–41)
Albumin: 4.3 g/dL (ref 3.5–5.0)
Alkaline Phosphatase: 72 U/L (ref 38–126)
Anion gap: 9 (ref 5–15)
BUN: 13 mg/dL (ref 6–20)
CO2: 28 mmol/L (ref 22–32)
Calcium: 9.5 mg/dL (ref 8.9–10.3)
Chloride: 102 mmol/L (ref 98–111)
Creatinine: 1.2 mg/dL — ABNORMAL HIGH (ref 0.44–1.00)
GFR, Estimated: 57 mL/min — ABNORMAL LOW (ref >60)
Glucose, Bld: 113 mg/dL — ABNORMAL HIGH (ref 70–99)
Potassium: 3.5 mmol/L (ref 3.5–5.1)
Sodium: 139 mmol/L (ref 135–145)
Total Bilirubin: 0.6 mg/dL (ref 0.3–1.2)
Total Protein: 7.9 g/dL (ref 6.5–8.1)

## 2022-01-13 LAB — MAGNESIUM: Magnesium: 1.7 mg/dL (ref 1.7–2.4)

## 2022-01-13 LAB — PREGNANCY, URINE: Preg Test, Ur: NEGATIVE

## 2022-01-13 MED ORDER — ZOLEDRONIC ACID 4 MG/100ML IV SOLN
4.0000 mg | Freq: Once | INTRAVENOUS | Status: AC
  Administered 2022-01-13: 4 mg via INTRAVENOUS
  Filled 2022-01-13: qty 100

## 2022-01-13 MED ORDER — GABAPENTIN 300 MG PO CAPS: ORAL_CAPSULE | ORAL | 0 refills | Status: DC

## 2022-01-13 NOTE — Progress Notes (Signed)
HEMATOLOGY/ONCOLOGY CLINIC NOTE  Date of Service: 01/13/2022  Patient Care Team: Celene Squibb, MD as PCP - General (Internal Medicine) Corrie Mckusick, DO as Consulting Physician (Interventional Radiology)  CHIEF COMPLAINTS/PURPOSE OF CONSULTATION:  Follow-up for continued evaluation management of myeloma  HISTORY OF PRESENTING ILLNESS:  Please see previous note for HPI.  INTERVAL HISTORY:  Stacey Montoya is a 46 y.o. female here for continued valuation and management of multiple myeloma. She reports She is doing well with no new symptoms or concerns.  Patient was last seen by me on 11/18/2021 and complained of abdominal discomfort, back discomfort, grade 1 diarrhea, and left leg weakness. She also reported that she discontinued Cymbalta due to experiencing warm tight bumps on her upper arms and experiencing dizziness.   Patient is doing well overall, but complains of occasional left heel pain which is worse at night and occasionally wakes her up at night. She notes this pain is probably due to her neuropathy. She reports her chronic back pain/discomfort is the same as our last visit. She takes oxycodone for the back pain/discomfort.   She discontinued PEA due to grade 1 diarrhea.  She denies any toxicities with Revlimid and with her blood thinner. She denies fever, chills, abdominal pain, abnormal bowl moment, leg swelling, or dental problem.   She has received her influenza vaccine and COVID-19 Booster. She had her 1st Shingles vaccine, her 2nd dose is around Christmas.   MEDICAL HISTORY:  Past Medical History:  Diagnosis Date   Abnormal Pap smear of cervix    Back pain    Cancer ([place]) 04/02/2020   multiple myeloma   Hyperlipidemia    Hypertension     SURGICAL HISTORY: Past Surgical History:  Procedure Laterality Date   CERVICAL CONIZATION W/BX  12/02/2010   Procedure: CONIZATION CERVIX WITH BIOPSY;  Surgeon: Arloa Koh;  Location: [facility];  Service: Gynecology;   Laterality: N/A;  COLD KNIFE   CESAREAN SECTION     DILATION AND CURETTAGE OF UTERUS  12/02/2010   Procedure: DILATATION AND CURETTAGE (D&C);  Surgeon: Arloa Koh;  Location: [facility];  Service: Gynecology;  Laterality: N/A;   IR RADIOLOGIST EVAL & MGMT  04/16/2020   KYPHOPLASTY Bilateral 09/05/2020   Procedure: KYPHOPLASTY THORACIC TWELVE AND LUMBAR FOUR;  Surgeon: Consuella Lose, MD;  Location: [facility];  Service: Neurosurgery;  Laterality: Bilateral;   left hand     drain - infection   WISDOM TOOTH EXTRACTION      SOCIAL HISTORY: Social History   Socioeconomic History   Marital status: Married    Spouse name: Not on file   Number of children: Not on file   Years of education: Not on file   Highest education level: Not on file  Occupational History   Not on file  Tobacco Use   Smoking status: Former    Packs/day: 0.50    Years: 20.00    Total pack years: 10.00    Types: Cigarettes   Smokeless tobacco: Never  Vaping Use   Vaping Use: Some days  Substance and Sexual Activity   Alcohol use: Not Currently   Drug use: Not Currently   Sexual activity: Not Currently    Partners: Male    Birth control/protection: Condom  Other Topics Concern   Not on file  Social History Narrative   ** Merged History Encounter **       Social Determinants of Health   Financial Resource Strain: Not on file  Food Insecurity: Not on file  Transportation Needs: Not on file  Physical Activity: Not on file  Stress: Not on file  Social Connections: Not on file  Intimate Partner Violence: Not on file    FAMILY HISTORY: Family History  Problem Relation Age of Onset   Lung cancer Maternal Grandfather    Pancreatic cancer Paternal Grandfather     ALLERGIES:  is allergic to zithromax [azithromycin].  MEDICATIONS:  Current Outpatient Medications  Medication Sig Dispense Refill   acyclovir (ZOVIRAX) 400 MG tablet TAKE (1) TABLET BY MOUTH TWICE DAILY. 60 tablet 0   albuterol (VENTOLIN  HFA) 108 (90 Base) MCG/ACT inhaler Inhale 1-2 puffs into the lungs every 6 (six) hours as needed for wheezing.     amLODipine (NORVASC) 5 MG tablet Take 1 tablet (5 mg total) by mouth daily. 30 tablet 0   b complex vitamins capsule Take 1 capsule by mouth daily. 30 capsule    DULoxetine (CYMBALTA) 30 MG capsule Take 1 capsule (30 mg total) by mouth daily. (Patient not taking: Reported on 09/23/2021) 30 capsule 3   ELIQUIS 5 MG TABS tablet TAKE 1 TABLET BY MOUTH TWICE DAILY. 60 tablet 0   ergocalciferol (VITAMIN D2) 1.25 MG (50000 UT) capsule Take 1 capsule (50,000 Units total) by mouth 2 (two) times a week. 12 capsule 2   folic acid (FOLVITE) 1 MG tablet TAKE 1 TABLET BY MOUTH ONCE A DAY. 90 tablet 0   furosemide (LASIX) 20 MG tablet TAKE 3 TABLETS BY MOUTH DAILY. 90 tablet 0   gabapentin (NEURONTIN) 300 MG capsule Take 37m in the morning, 600 mg in the afternoon and 9020mat bedtime. 180 capsule 0   lenalidomide (REVLIMID) 10 MG capsule TAKE 1 CAPSULE BY MOUTH DAILY  FOR 21 DAYS AS DIRECTED 21 capsule 0   magic mouthwash w/lidocaine SOLN Take 5 mLs by mouth 3 (three) times daily as needed for mouth pain. Dispense 40012mf magic mouthwash compounded preparation. Patient to use 5ml32mtimes daily as needed for throat discomfort. 80 ml viscous lidocaine 2% 80 ml Mylanta 80 ml diphenhydramine at 12.5 mg per 5 ml elixir 80 ml nystatin at 100,000U per 5 mL suspension 80 ml distilled water (Patient not taking: Reported on 11/18/2021)     medroxyPROGESTERone (PROVERA) 10 MG tablet Take 10 mg by mouth daily.     metoprolol tartrate (LOPRESSOR) 50 MG tablet Take 1 tablet (50 mg total) by mouth 2 (two) times daily. 60 tablet 0   nicotine (NICODERM CQ - DOSED IN MG/24 HOURS) 21 mg/24hr patch Place 1 patch (21 mg total) onto the skin daily. 28 patch 0   ondansetron (ZOFRAN) 4 MG tablet Take 8 mg by mouth every 8 (eight) hours.     oxyCODONE (OXY IR/ROXICODONE) 5 MG immediate release tablet TAKE 1 TABLET BY  MOUTH EVERY 6 HOURS AS NEEDED. 60 tablet 0   polyethylene glycol (MIRALAX / GLYCOLAX) 17 g packet Take 17 g by mouth daily as needed for mild constipation.     potassium chloride SA (KLOR-CON M) 20 MEQ tablet TAKE (1) TABLET BY MOUTH TWICE DAILY. 60 tablet 0   pravastatin (PRAVACHOL) 40 MG tablet Take 40 mg by mouth daily.     prochlorperazine (COMPAZINE) 10 MG tablet Take 1 tablet (10 mg total) by mouth every 6 (six) hours as needed for nausea or vomiting. 30 tablet 2   No current facility-administered medications for this visit.    REVIEW OF SYSTEMS:   . 10 Point  review of Systems was done is negative except as noted above.   PHYSICAL EXAMINATION: ECOG PERFORMANCE STATUS: 2 - Symptomatic, <50% confined to bed .BP 119/67 (BP Location: Left Arm, Patient Position: Sitting)   Pulse 89   Temp 98.1 F (36.7 C) (Temporal)   Resp 18   Ht _0  (1.626 m)   Wt 253 lb 14.4 oz (115.2 kg)   SpO2 98%   BMI 43.58 kg/m  NAD GENERAL:alert, in no acute distress and comfortable SKIN: no acute rashes, no significant lesions EYES: conjunctiva are pink and non-injected, sclera anicteric NECK: supple, no JVD LYMPH:  no palpable lymphadenopathy in the cervical, axillary or inguinal regions LUNGS: clear to auscultation b/l with normal respiratory effort HEART: regular rate & rhythm ABDOMEN:  normoactive bowel sounds , non tender, not distended. Extremity: no pedal edema PSYCH: alert & oriented x 3 with fluent speech NEURO: no focal motor/sensory deficits  Exam performed in chair.  LABORATORY DATA:  I have reviewed the data as listed  .Marland Kitchen    Latest Ref Rng & Units 01/13/2022    9:09 AM 11/18/2021    9:56 AM 09/23/2021    9:48 AM  CBC  WBC 4.0 - 10.5 K/uL 5.3  4.9  5.3   Hemoglobin 12.0 - 15.0 g/dL 12.3  12.5  12.0   Hematocrit 36.0 - 46.0 % 37.7  38.0  36.4   Platelets 150 - 400 K/uL 202  214  193     .    Latest Ref Rng & Units 01/13/2022    9:09 AM 11/18/2021    9:56 AM 09/23/2021     9:48 AM  CMP  Glucose 70 - 99 mg/dL 113  109  111   BUN 6 - 20 mg/dL _1 Creatinine 0.44 - 1.00 mg/dL 1.20  1.05  0.94   Sodium 135 - 145 mmol/L 139  137  137   Potassium 3.5 - 5.1 mmol/L 3.5  3.8  3.5   Chloride 98 - 111 mmol/L 102  104  101   CO2 22 - 32 mmol/L _2 Calcium 8.9 - 10.3 mg/dL 9.5  8.9  9.2   Total Protein 6.5 - 8.1 g/dL 7.9  6.8  7.4   Total Bilirubin 0.3 - 1.2 mg/dL 0.6  0.5  0.4   Alkaline Phos 38 - 126 U/L 72  69  57   AST 15 - 41 U/L 17  61  21   ALT 0 - 44 U/L 40  86  32       RADIOGRAPHIC STUDIES: I have personally reviewed the radiological images as listed and agreed with the findings in the report. No results found.   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  2) h/o hypercalcemia due to multiple myeloma-now resolved with IV fluids, calcitonin, pamidronate. 3) h/o anemia due to multiple myeloma becoming more apparent as the patient's hemoconcentration due to dehydration has been corrected.   4) h/o acute renal failure related to dehydration hypercalcemia and multiple myeloma.  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  PLAN: -Labs done today were reviewed in detail with the patient. CBC and CMP are stable.  Myeloma panel shows no M spike -Patient notes no notable toxicities from her maintenance Revlimid  and will continue maintenance Revlimid at this time at 10 mg 3 weeks on 1 week off. -Continue Eliquis. -Continuing as needed oxycodone.   FOLLOW UP: Please schedule next 3 treatments Zometa every 2 months with labs Return to clinic with Dr. Irene Limbo with labs in 2 months   The total time spent in the appointment was 21 minutes* .  All of the patient's questions were answered with apparent satisfaction. The patient knows to call the clinic with any problems, questions or concerns.   Sullivan Lone MD MS AAHIVMS [facility] Hematology/Oncology Physician [facility]  .*Total Encounter Time as defined by the Centers for Medicare and Medicaid Services includes, in addition to the face-to-face time of a patient visit (documented in the note above) non-face-to-face time: obtaining and reviewing outside history, ordering and reviewing medications, tests or procedures, care coordination (communications with other health care professionals or caregivers) and documentation in the medical record.   I, Cleda Mccreedy, am acting as a Education administrator for Sullivan Lone, MD. .I have reviewed the above documentation for accuracy and completeness, and I agree with the above. Brunetta Genera MD

## 2022-01-13 NOTE — Progress Notes (Signed)
Patient seen by MD today  Vitals are within treatment parameters.  Labs reviewed: ,"and are not all within treatment parameters. Dr Irene Limbo aware Cr 1.20  and GFR 57  Per physician team, patient is ready for treatment and there are NO modifications to the treatment plan.

## 2022-01-13 NOTE — Patient Instructions (Signed)

## 2022-01-14 ENCOUNTER — Other Ambulatory Visit: Payer: Self-pay | Admitting: Hematology

## 2022-01-14 DIAGNOSIS — C7951 Secondary malignant neoplasm of bone: Secondary | ICD-10-CM

## 2022-01-15 LAB — MULTIPLE MYELOMA PANEL, SERUM
Albumin SerPl Elph-Mcnc: 3.6 g/dL (ref 2.9–4.4)
Albumin/Glob SerPl: 1.1 (ref 0.7–1.7)
Alpha 1: 0.3 g/dL (ref 0.0–0.4)
Alpha2 Glob SerPl Elph-Mcnc: 0.8 g/dL (ref 0.4–1.0)
B-Globulin SerPl Elph-Mcnc: 1.1 g/dL (ref 0.7–1.3)
Gamma Glob SerPl Elph-Mcnc: 1.1 g/dL (ref 0.4–1.8)
Globulin, Total: 3.3 g/dL (ref 2.2–3.9)
IgA: 305 mg/dL (ref 87–352)
IgG (Immunoglobin G), Serum: 1123 mg/dL (ref 586–1602)
IgM (Immunoglobulin M), Srm: 17 mg/dL — ABNORMAL LOW (ref 26–217)
Total Protein ELP: 6.9 g/dL (ref 6.0–8.5)

## 2022-01-16 ENCOUNTER — Other Ambulatory Visit: Payer: Self-pay

## 2022-01-19 ENCOUNTER — Encounter: Payer: Self-pay | Admitting: Hematology

## 2022-01-19 ENCOUNTER — Other Ambulatory Visit: Payer: Self-pay

## 2022-01-21 ENCOUNTER — Other Ambulatory Visit: Payer: Self-pay | Admitting: Hematology

## 2022-01-21 DIAGNOSIS — C9000 Multiple myeloma not having achieved remission: Secondary | ICD-10-CM

## 2022-01-26 ENCOUNTER — Other Ambulatory Visit: Payer: Self-pay | Admitting: Hematology

## 2022-01-26 ENCOUNTER — Other Ambulatory Visit: Payer: Self-pay

## 2022-01-26 DIAGNOSIS — C9000 Multiple myeloma not having achieved remission: Secondary | ICD-10-CM

## 2022-01-26 DIAGNOSIS — C9001 Multiple myeloma in remission: Secondary | ICD-10-CM

## 2022-02-02 ENCOUNTER — Other Ambulatory Visit: Payer: Self-pay | Admitting: Hematology

## 2022-02-03 ENCOUNTER — Inpatient Hospital Stay: Payer: 59 | Attending: Hematology

## 2022-02-03 ENCOUNTER — Other Ambulatory Visit: Payer: Self-pay

## 2022-02-03 DIAGNOSIS — C9000 Multiple myeloma not having achieved remission: Secondary | ICD-10-CM | POA: Insufficient documentation

## 2022-02-03 DIAGNOSIS — C9001 Multiple myeloma in remission: Secondary | ICD-10-CM

## 2022-02-03 LAB — MAGNESIUM: Magnesium: 2.3 mg/dL (ref 1.7–2.4)

## 2022-02-03 LAB — CBC WITH DIFFERENTIAL (CANCER CENTER ONLY)
Abs Immature Granulocytes: 0.02 10*3/uL (ref 0.00–0.07)
Basophils Absolute: 0 10*3/uL (ref 0.0–0.1)
Basophils Relative: 0 %
Eosinophils Absolute: 0.6 10*3/uL — ABNORMAL HIGH (ref 0.0–0.5)
Eosinophils Relative: 9 %
HCT: 38.1 % (ref 36.0–46.0)
Hemoglobin: 12.7 g/dL (ref 12.0–15.0)
Immature Granulocytes: 0 %
Lymphocytes Relative: 15 %
Lymphs Abs: 1 10*3/uL (ref 0.7–4.0)
MCH: 31.4 pg (ref 26.0–34.0)
MCHC: 33.3 g/dL (ref 30.0–36.0)
MCV: 94.3 fL (ref 80.0–100.0)
Monocytes Absolute: 0.7 10*3/uL (ref 0.1–1.0)
Monocytes Relative: 10 %
Neutro Abs: 4.3 10*3/uL (ref 1.7–7.7)
Neutrophils Relative %: 66 %
Platelet Count: 209 10*3/uL (ref 150–400)
RBC: 4.04 MIL/uL (ref 3.87–5.11)
RDW: 17.7 % — ABNORMAL HIGH (ref 11.5–15.5)
WBC Count: 6.6 10*3/uL (ref 4.0–10.5)
nRBC: 0 % (ref 0.0–0.2)

## 2022-02-03 LAB — CMP (CANCER CENTER ONLY)
ALT: 52 U/L — ABNORMAL HIGH (ref 0–44)
AST: 30 U/L (ref 15–41)
Albumin: 3.5 g/dL (ref 3.5–5.0)
Alkaline Phosphatase: 66 U/L (ref 38–126)
Anion gap: 9 (ref 5–15)
BUN: 19 mg/dL (ref 6–20)
CO2: 23 mmol/L (ref 22–32)
Calcium: 9.3 mg/dL (ref 8.9–10.3)
Chloride: 104 mmol/L (ref 98–111)
Creatinine: 1.6 mg/dL — ABNORMAL HIGH (ref 0.44–1.00)
GFR, Estimated: 41 mL/min — ABNORMAL LOW (ref >60)
Glucose, Bld: 115 mg/dL — ABNORMAL HIGH (ref 70–99)
Potassium: 3 mmol/L — ABNORMAL LOW (ref 3.5–5.1)
Sodium: 136 mmol/L (ref 135–145)
Total Bilirubin: 0.9 mg/dL (ref 0.3–1.2)
Total Protein: 7.9 g/dL (ref 6.5–8.1)

## 2022-02-03 LAB — PREGNANCY, URINE: Preg Test, Ur: NEGATIVE

## 2022-02-06 ENCOUNTER — Other Ambulatory Visit: Payer: Self-pay

## 2022-02-06 DIAGNOSIS — C9001 Multiple myeloma in remission: Secondary | ICD-10-CM

## 2022-02-06 MED ORDER — LENALIDOMIDE 10 MG PO CAPS: ORAL_CAPSULE | ORAL | 0 refills | Status: DC

## 2022-02-10 LAB — MULTIPLE MYELOMA PANEL, SERUM
Albumin SerPl Elph-Mcnc: 3.8 g/dL (ref 2.9–4.4)
Albumin/Glob SerPl: 1.1 (ref 0.7–1.7)
Alpha 1: 0.3 g/dL (ref 0.0–0.4)
Alpha2 Glob SerPl Elph-Mcnc: 0.8 g/dL (ref 0.4–1.0)
B-Globulin SerPl Elph-Mcnc: 1.2 g/dL (ref 0.7–1.3)
Gamma Glob SerPl Elph-Mcnc: 1.3 g/dL (ref 0.4–1.8)
Globulin, Total: 3.5 g/dL (ref 2.2–3.9)
IgA: 339 mg/dL (ref 87–352)
IgG (Immunoglobin G), Serum: 1214 mg/dL (ref 586–1602)
IgM (Immunoglobulin M), Srm: 19 mg/dL — ABNORMAL LOW (ref 26–217)
Total Protein ELP: 7.3 g/dL (ref 6.0–8.5)

## 2022-02-13 ENCOUNTER — Other Ambulatory Visit: Payer: Self-pay | Admitting: Hematology

## 2022-02-13 DIAGNOSIS — C7951 Secondary malignant neoplasm of bone: Secondary | ICD-10-CM

## 2022-02-13 DIAGNOSIS — C9001 Multiple myeloma in remission: Secondary | ICD-10-CM

## 2022-02-17 ENCOUNTER — Other Ambulatory Visit: Payer: Self-pay

## 2022-02-26 ENCOUNTER — Other Ambulatory Visit: Payer: Self-pay | Admitting: Hematology

## 2022-02-26 DIAGNOSIS — C9000 Multiple myeloma not having achieved remission: Secondary | ICD-10-CM

## 2022-03-02 ENCOUNTER — Other Ambulatory Visit: Payer: Self-pay | Admitting: Hematology

## 2022-03-02 ENCOUNTER — Other Ambulatory Visit: Payer: Self-pay

## 2022-03-02 DIAGNOSIS — C9001 Multiple myeloma in remission: Secondary | ICD-10-CM

## 2022-03-02 DIAGNOSIS — C9000 Multiple myeloma not having achieved remission: Secondary | ICD-10-CM

## 2022-03-09 ENCOUNTER — Other Ambulatory Visit: Payer: Self-pay | Admitting: Hematology

## 2022-03-09 DIAGNOSIS — C9001 Multiple myeloma in remission: Secondary | ICD-10-CM

## 2022-03-10 ENCOUNTER — Other Ambulatory Visit: Payer: Self-pay

## 2022-03-10 ENCOUNTER — Encounter: Payer: Self-pay | Admitting: Hematology

## 2022-03-10 ENCOUNTER — Inpatient Hospital Stay: Payer: 59 | Attending: Hematology

## 2022-03-10 DIAGNOSIS — C7951 Secondary malignant neoplasm of bone: Secondary | ICD-10-CM

## 2022-03-10 DIAGNOSIS — C9000 Multiple myeloma not having achieved remission: Secondary | ICD-10-CM | POA: Diagnosis not present

## 2022-03-10 LAB — CBC WITH DIFFERENTIAL (CANCER CENTER ONLY)
Abs Immature Granulocytes: 0 10*3/uL (ref 0.00–0.07)
Basophils Absolute: 0 10*3/uL (ref 0.0–0.1)
Basophils Relative: 1 %
Eosinophils Absolute: 0.3 10*3/uL (ref 0.0–0.5)
Eosinophils Relative: 9 %
HCT: 38.3 % (ref 36.0–46.0)
Hemoglobin: 12.5 g/dL (ref 12.0–15.0)
Immature Granulocytes: 0 %
Lymphocytes Relative: 21 %
Lymphs Abs: 0.8 10*3/uL (ref 0.7–4.0)
MCH: 31.2 pg (ref 26.0–34.0)
MCHC: 32.6 g/dL (ref 30.0–36.0)
MCV: 95.5 fL (ref 80.0–100.0)
Monocytes Absolute: 0.4 10*3/uL (ref 0.1–1.0)
Monocytes Relative: 10 %
Neutro Abs: 2.3 10*3/uL (ref 1.7–7.7)
Neutrophils Relative %: 59 %
Platelet Count: 151 10*3/uL (ref 150–400)
RBC: 4.01 MIL/uL (ref 3.87–5.11)
RDW: 18.1 % — ABNORMAL HIGH (ref 11.5–15.5)
WBC Count: 3.8 10*3/uL — ABNORMAL LOW (ref 4.0–10.5)
nRBC: 0 % (ref 0.0–0.2)

## 2022-03-10 LAB — CMP (CANCER CENTER ONLY)
ALT: 36 U/L (ref 0–44)
AST: 23 U/L (ref 15–41)
Albumin: 4.1 g/dL (ref 3.5–5.0)
Alkaline Phosphatase: 59 U/L (ref 38–126)
Anion gap: 10 (ref 5–15)
BUN: 13 mg/dL (ref 6–20)
CO2: 27 mmol/L (ref 22–32)
Calcium: 9.7 mg/dL (ref 8.9–10.3)
Chloride: 102 mmol/L (ref 98–111)
Creatinine: 1.13 mg/dL — ABNORMAL HIGH (ref 0.44–1.00)
GFR, Estimated: >60 mL/min (ref >60)
Glucose, Bld: 105 mg/dL — ABNORMAL HIGH (ref 70–99)
Potassium: 3.7 mmol/L (ref 3.5–5.1)
Sodium: 139 mmol/L (ref 135–145)
Total Bilirubin: 0.6 mg/dL (ref 0.3–1.2)
Total Protein: 7.2 g/dL (ref 6.5–8.1)

## 2022-03-10 LAB — MAGNESIUM: Magnesium: 1.5 mg/dL — ABNORMAL LOW (ref 1.7–2.4)

## 2022-03-10 LAB — PREGNANCY, URINE: Preg Test, Ur: NEGATIVE

## 2022-03-16 LAB — MULTIPLE MYELOMA PANEL, SERUM
Albumin SerPl Elph-Mcnc: 3.9 g/dL (ref 2.9–4.4)
Albumin/Glob SerPl: 1.3 (ref 0.7–1.7)
Alpha 1: 0.3 g/dL (ref 0.0–0.4)
Alpha2 Glob SerPl Elph-Mcnc: 0.8 g/dL (ref 0.4–1.0)
B-Globulin SerPl Elph-Mcnc: 1.2 g/dL (ref 0.7–1.3)
Gamma Glob SerPl Elph-Mcnc: 1 g/dL (ref 0.4–1.8)
Globulin, Total: 3.2 g/dL (ref 2.2–3.9)
IgA: 348 mg/dL (ref 87–352)
IgG (Immunoglobin G), Serum: 957 mg/dL (ref 586–1602)
IgM (Immunoglobulin M), Srm: 22 mg/dL — ABNORMAL LOW (ref 26–217)
Total Protein ELP: 7.1 g/dL (ref 6.0–8.5)

## 2022-03-17 ENCOUNTER — Inpatient Hospital Stay: Payer: 59

## 2022-03-17 ENCOUNTER — Other Ambulatory Visit: Payer: Self-pay

## 2022-03-17 ENCOUNTER — Other Ambulatory Visit: Payer: 59

## 2022-03-17 ENCOUNTER — Inpatient Hospital Stay: Payer: 59 | Admitting: Hematology

## 2022-03-17 VITALS — BP 122/74 | HR 98 | Temp 99.7°F | Resp 17 | Wt 256.2 lb

## 2022-03-17 DIAGNOSIS — C9001 Multiple myeloma in remission: Secondary | ICD-10-CM | POA: Diagnosis not present

## 2022-03-17 DIAGNOSIS — C7951 Secondary malignant neoplasm of bone: Secondary | ICD-10-CM

## 2022-03-17 DIAGNOSIS — C9000 Multiple myeloma not having achieved remission: Secondary | ICD-10-CM

## 2022-03-17 DIAGNOSIS — G893 Neoplasm related pain (acute) (chronic): Secondary | ICD-10-CM

## 2022-03-17 MED ORDER — ZOLEDRONIC ACID 4 MG/100ML IV SOLN
4.0000 mg | Freq: Once | INTRAVENOUS | Status: AC
  Administered 2022-03-17: 4 mg via INTRAVENOUS
  Filled 2022-03-17: qty 100

## 2022-03-17 MED ORDER — POTASSIUM CHLORIDE CRYS ER 20 MEQ PO TBCR: EXTENDED_RELEASE_TABLET | ORAL | 0 refills | Status: DC

## 2022-03-17 MED ORDER — GABAPENTIN 300 MG PO CAPS: ORAL_CAPSULE | ORAL | 1 refills | Status: DC

## 2022-03-17 NOTE — Progress Notes (Signed)
HEMATOLOGY/ONCOLOGY CLINIC NOTE  Date of Service: 03/17/2022  Patient Care Team: Celene Squibb, MD as PCP - General (Internal Medicine) Corrie Mckusick, DO as Consulting Physician (Interventional Radiology)  CHIEF COMPLAINTS/PURPOSE OF CONSULTATION:  Follow-up for continued evaluation management of myeloma  HISTORY OF PRESENTING ILLNESS:  Please see previous note for HPI.  INTERVAL HISTORY:  Stacey Montoya is a 47 y.o. female here for continued valuation and management of multiple myeloma.   Patient was last seen by me on 01/13/2022 and she complained of occasional left heel pain and chronic back pain/discomfort.   Patient reports she has been doing well overall without any medical concerns since our last visit. She notes she had upper respiratory infection symptoms around christmas. Patient notes she did not get tested for COVID-19 or influenza. However, her son tested positive for COVID-19 around New year. Patient reports she only had chest congestion. She was treated with two antibiotics and has recovered. She notes this is the first infection in the past 6 months.   She denies fever, chills, night sweats, unexpected weight loss, back pain, chest pain, or leg swelling. She complains of mild cognitive impairments, such as short-term memory problem and problem with words. Patient contributes these cognitive impairments to her previous chemotherapy.   Patient still complains of chronic back pain, which does not limit her from walking. She note she does back stretches to help her back pain.   She has been taking Vitamin-D supplement.  She has received influenza vaccine and COVID-19 Booster. She is up to speed with all other age appropriate vaccines. However, she has not received her Shingles vaccines yet due to recent upper respiratory infection.   MEDICAL HISTORY:  Past Medical History:  Diagnosis Date   Abnormal Pap smear of cervix    Back pain    Cancer ([place]) 04/02/2020    multiple myeloma   Hyperlipidemia    Hypertension     SURGICAL HISTORY: Past Surgical History:  Procedure Laterality Date   CERVICAL CONIZATION W/BX  12/02/2010   Procedure: CONIZATION CERVIX WITH BIOPSY;  Surgeon: Arloa Koh;  Location: [facility];  Service: Gynecology;  Laterality: N/A;  COLD KNIFE   CESAREAN SECTION     DILATION AND CURETTAGE OF UTERUS  12/02/2010   Procedure: DILATATION AND CURETTAGE (D&C);  Surgeon: Arloa Koh;  Location: [facility];  Service: Gynecology;  Laterality: N/A;   IR RADIOLOGIST EVAL & MGMT  04/16/2020   KYPHOPLASTY Bilateral 09/05/2020   Procedure: KYPHOPLASTY THORACIC TWELVE AND LUMBAR FOUR;  Surgeon: Consuella Lose, MD;  Location: [place];  Service: Neurosurgery;  Laterality: Bilateral;   left hand     drain - infection   WISDOM TOOTH EXTRACTION      SOCIAL HISTORY: Social History   Socioeconomic History   Marital status: Married    Spouse name: Not on file   Number of children: Not on file   Years of education: Not on file   Highest education level: Not on file  Occupational History   Not on file  Tobacco Use   Smoking status: Former    Packs/day: 0.50    Years: 20.00    Total pack years: 10.00    Types: Cigarettes   Smokeless tobacco: Never  Vaping Use   Vaping Use: Some days  Substance and Sexual Activity   Alcohol use: Not Currently   Drug use: Not Currently   Sexual activity: Not Currently    Partners: Male    Birth  control/protection: Condom  Other Topics Concern   Not on file  Social History Narrative   ** Merged History Encounter **       Social Determinants of Health   Financial Resource Strain: Not on file  Food Insecurity: Not on file  Transportation Needs: Not on file  Physical Activity: Not on file  Stress: Not on file  Social Connections: Not on file  Intimate Partner Violence: Not on file    FAMILY HISTORY: Family History  Problem Relation Age of Onset   Lung cancer Maternal Grandfather    Pancreatic  cancer Paternal Grandfather     ALLERGIES:  is allergic to zithromax [azithromycin].  MEDICATIONS:  Current Outpatient Medications  Medication Sig Dispense Refill   acyclovir (ZOVIRAX) 400 MG tablet TAKE (1) TABLET BY MOUTH TWICE DAILY. 60 tablet 0   albuterol (VENTOLIN HFA) 108 (90 Base) MCG/ACT inhaler Inhale 1-2 puffs into the lungs every 6 (six) hours as needed for wheezing.     amLODipine (NORVASC) 5 MG tablet Take 1 tablet (5 mg total) by mouth daily. 30 tablet 0   b complex vitamins capsule Take 1 capsule by mouth daily. 30 capsule    ELIQUIS 5 MG TABS tablet TAKE 1 TABLET BY MOUTH TWICE DAILY. 60 tablet 0   ergocalciferol (VITAMIN D2) 1.25 MG (50000 UT) capsule Take 1 capsule (50,000 Units total) by mouth 2 (two) times a week. 12 capsule 2   folic acid (FOLVITE) 1 MG tablet TAKE 1 TABLET BY MOUTH ONCE A DAY. 90 tablet 0   furosemide (LASIX) 20 MG tablet TAKE 3 TABLETS BY MOUTH DAILY. 90 tablet 0   gabapentin (NEURONTIN) 300 MG capsule TAKE 1 CAPSULES IN THE MORNING, TAKE 2 CAPSULES IN THE AFTERNOON, AND TAKE 3 CAPSULES AT BEDTIME 180 capsule 0   lenalidomide (REVLIMID) 10 MG capsule TAKE 1 CAPSULE (10 mg)  BY MOUTH DAILY  FOR 21 DAYS AS DIRECTED then take 7 days off. 21 capsule 0   magic mouthwash w/lidocaine SOLN Take 5 mLs by mouth 3 (three) times daily as needed for mouth pain. Dispense 472m of magic mouthwash compounded preparation. Patient to use 53m4 times daily as needed for throat discomfort. 80 ml viscous lidocaine 2% 80 ml Mylanta 80 ml diphenhydramine at 12.5 mg per 5 ml elixir 80 ml nystatin at 100,000U per 5 mL suspension 80 ml distilled water     medroxyPROGESTERone (PROVERA) 10 MG tablet Take 10 mg by mouth daily.     metoprolol tartrate (LOPRESSOR) 50 MG tablet Take 1 tablet (50 mg total) by mouth 2 (two) times daily. 60 tablet 0   nicotine (NICODERM CQ - DOSED IN MG/24 HOURS) 21 mg/24hr patch Place 1 patch (21 mg total) onto the skin daily. 28 patch 0    ondansetron (ZOFRAN) 4 MG tablet Take 8 mg by mouth every 8 (eight) hours.     oxyCODONE (OXY IR/ROXICODONE) 5 MG immediate release tablet TAKE 1 TABLET BY MOUTH EVERY 6 HOURS AS NEEDED. 60 tablet 0   polyethylene glycol (MIRALAX / GLYCOLAX) 17 g packet Take 17 g by mouth daily as needed for mild constipation.     potassium chloride SA (KLOR-CON M) 20 MEQ tablet TAKE (1) TABLET BY MOUTH TWICE DAILY. 60 tablet 0   pravastatin (PRAVACHOL) 40 MG tablet Take 40 mg by mouth daily.     prochlorperazine (COMPAZINE) 10 MG tablet Take 1 tablet (10 mg total) by mouth every 6 (six) hours as needed for nausea or vomiting. [place]  tablet 2   No current facility-administered medications for this visit.    REVIEW OF SYSTEMS:   . 10 Point review of Systems was done is negative except as noted above.   PHYSICAL EXAMINATION: ECOG PERFORMANCE STATUS: 2 - Symptomatic, <50% confined to bed .BP 122/74 (BP Location: Left Arm, Patient Position: Sitting)   Pulse 98   Temp 99.7 F (37.6 C) (Oral)   Resp 17   Wt 256 lb 3.2 oz (116.2 kg)   SpO2 100%   BMI 43.98 kg/m  NAD GENERAL:alert, in no acute distress and comfortable SKIN: no acute rashes, no significant lesions EYES: conjunctiva are pink and non-injected, sclera anicteric NECK: supple, no JVD LYMPH:  no palpable lymphadenopathy in the cervical, axillary or inguinal regions LUNGS: clear to auscultation b/l with normal respiratory effort HEART: regular rate & rhythm ABDOMEN:  normoactive bowel sounds , non tender, not distended. Extremity: no pedal edema PSYCH: alert & oriented x 3 with fluent speech NEURO: no focal motor/sensory deficits  Exam performed in chair.  LABORATORY DATA:  I have reviewed the data as listed  .Marland Kitchen    Latest Ref Rng & Units 03/10/2022    9:46 AM 02/03/2022   10:14 AM 01/13/2022    9:09 AM  CBC  WBC 4.0 - 10.5 K/uL 3.8  6.6  5.3   Hemoglobin 12.0 - 15.0 g/dL 12.5  12.7  12.3   Hematocrit 36.0 - 46.0 % 38.3  38.1  37.7    Platelets 150 - 400 K/uL 151  209  202     .    Latest Ref Rng & Units 03/10/2022    9:46 AM 02/03/2022   10:14 AM 01/13/2022    9:09 AM  CMP  Glucose 70 - 99 mg/dL 105  115  113   BUN 6 - 20 mg/dL '13  19  13   '$ Creatinine 0.44 - 1.00 mg/dL 1.13  1.60  1.20   Sodium 135 - 145 mmol/L 139  136  139   Potassium 3.5 - 5.1 mmol/L 3.7  3.0  3.5   Chloride 98 - 111 mmol/L 102  104  102   CO2 22 - 32 mmol/L '27  23  28   '$ Calcium 8.9 - 10.3 mg/dL 9.7  9.3  9.5   Total Protein 6.5 - 8.1 g/dL 7.2  7.9  7.9   Total Bilirubin 0.3 - 1.2 mg/dL 0.6  0.9  0.6   Alkaline Phos 38 - 126 U/L 59  66  72   AST 15 - 41 U/L '23  30  17   '$ ALT 0 - 44 U/L 36  52  40     RADIOGRAPHIC STUDIES: I have personally reviewed the radiological images as listed and agreed with the findings in the report. No results found.   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  2) h/o hypercalcemia due to multiple myeloma-now resolved with IV fluids, calcitonin, pamidronate. 3) h/o anemia due to multiple myeloma becoming more apparent as the patient's hemoconcentration due to dehydration has been corrected.   4) h/o acute renal failure related to dehydration hypercalcemia and multiple  myeloma.  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  PLAN: -Discussed lab results from 03/10/2022 with the  patient. CBC shows WBC of 3.8. CMP showed creatinine of 1.13. Magnesium is border-line low, probably due to antidiuretics.  -myeloma panel with no M spike and neg IFE. -Recommended Magnesium supplement or eat high-magnesium food.  -Patient notes no notable toxicities from her maintenance Revlimid and will continue maintenance Revlimid at this time at 10 mg 3 weeks on 1 week off. -Continue Eliquis. -Continuing as needed oxycodone. -Will refill Gabapentin.  -Answered all of patient's questions about myeloma and her treatment.  -Continue Zometa infusions every other month for 2 years.  -Continue Vitamin-D supplements.   FOLLOW UP: Please schedule next 3 treatments Zometa every 2 months with labs Return to clinic with Dr. Irene Limbo with labs in 2 months   The total time spent in the appointment was 30 minutes* .  All of the patient's questions were answered with apparent satisfaction. The patient knows to call the clinic with any problems, questions or concerns.   Sullivan Lone MD MS AAHIVMS [facility] Hematology/Oncology Physician [facility]  .*Total Encounter Time as defined by the Centers for Medicare and Medicaid Services includes, in addition to the face-to-face time of a patient visit (documented in the note above) non-face-to-face time: obtaining and reviewing outside history, ordering and reviewing medications, tests or procedures, care coordination (communications with other health care professionals or caregivers) and documentation in the medical record.   I, Cleda Mccreedy, am acting as a Education administrator for Sullivan Lone, MD. .I have reviewed the above documentation for accuracy and completeness, and I agree with the above. Brunetta Genera MD

## 2022-03-17 NOTE — Patient Instructions (Signed)

## 2022-03-17 NOTE — Progress Notes (Signed)
Patient seen by MD today  Vitals are within treatment parameters.  Labs reviewed: and are within treatment parameters."Dr Irene Limbo aware that labs are from 03/10/22/ ok for tx with these labs  Per physician team, patient is ready for treatment and there are NO modifications to the treatment plan.

## 2022-03-20 ENCOUNTER — Other Ambulatory Visit: Payer: Self-pay

## 2022-03-23 ENCOUNTER — Encounter: Payer: Self-pay | Admitting: Hematology

## 2022-03-24 ENCOUNTER — Other Ambulatory Visit: Payer: Self-pay

## 2022-03-31 ENCOUNTER — Other Ambulatory Visit: Payer: Self-pay | Admitting: Hematology

## 2022-03-31 DIAGNOSIS — C9001 Multiple myeloma in remission: Secondary | ICD-10-CM

## 2022-03-31 DIAGNOSIS — C9000 Multiple myeloma not having achieved remission: Secondary | ICD-10-CM

## 2022-04-03 ENCOUNTER — Other Ambulatory Visit: Payer: Self-pay

## 2022-04-03 DIAGNOSIS — C9000 Multiple myeloma not having achieved remission: Secondary | ICD-10-CM

## 2022-04-04 ENCOUNTER — Other Ambulatory Visit: Payer: Self-pay | Admitting: Hematology

## 2022-04-04 DIAGNOSIS — C9001 Multiple myeloma in remission: Secondary | ICD-10-CM

## 2022-04-06 ENCOUNTER — Other Ambulatory Visit: Payer: Self-pay | Admitting: Hematology

## 2022-04-07 ENCOUNTER — Inpatient Hospital Stay: Payer: 59 | Attending: Hematology

## 2022-04-07 ENCOUNTER — Other Ambulatory Visit: Payer: Self-pay

## 2022-04-07 DIAGNOSIS — C9000 Multiple myeloma not having achieved remission: Secondary | ICD-10-CM

## 2022-04-07 DIAGNOSIS — C9001 Multiple myeloma in remission: Secondary | ICD-10-CM

## 2022-04-07 LAB — CBC WITH DIFFERENTIAL (CANCER CENTER ONLY)
Abs Immature Granulocytes: 0.01 10*3/uL (ref 0.00–0.07)
Basophils Absolute: 0 10*3/uL (ref 0.0–0.1)
Basophils Relative: 0 %
Eosinophils Absolute: 0.3 10*3/uL (ref 0.0–0.5)
Eosinophils Relative: 6 %
HCT: 38.8 % (ref 36.0–46.0)
Hemoglobin: 12.7 g/dL (ref 12.0–15.0)
Immature Granulocytes: 0 %
Lymphocytes Relative: 14 %
Lymphs Abs: 0.7 10*3/uL (ref 0.7–4.0)
MCH: 31.5 pg (ref 26.0–34.0)
MCHC: 32.7 g/dL (ref 30.0–36.0)
MCV: 96.3 fL (ref 80.0–100.0)
Monocytes Absolute: 0.4 10*3/uL (ref 0.1–1.0)
Monocytes Relative: 9 %
Neutro Abs: 3.7 10*3/uL (ref 1.7–7.7)
Neutrophils Relative %: 71 %
Platelet Count: 195 10*3/uL (ref 150–400)
RBC: 4.03 MIL/uL (ref 3.87–5.11)
RDW: 17.9 % — ABNORMAL HIGH (ref 11.5–15.5)
WBC Count: 5.2 10*3/uL (ref 4.0–10.5)
nRBC: 0 % (ref 0.0–0.2)

## 2022-04-07 LAB — CMP (CANCER CENTER ONLY)
ALT: 40 U/L (ref 0–44)
AST: 27 U/L (ref 15–41)
Albumin: 4.2 g/dL (ref 3.5–5.0)
Alkaline Phosphatase: 76 U/L (ref 38–126)
Anion gap: 9 (ref 5–15)
BUN: 15 mg/dL (ref 6–20)
CO2: 27 mmol/L (ref 22–32)
Calcium: 9.6 mg/dL (ref 8.9–10.3)
Chloride: 103 mmol/L (ref 98–111)
Creatinine: 1.53 mg/dL — ABNORMAL HIGH (ref 0.44–1.00)
GFR, Estimated: 43 mL/min — ABNORMAL LOW (ref >60)
Glucose, Bld: 111 mg/dL — ABNORMAL HIGH (ref 70–99)
Potassium: 3.3 mmol/L — ABNORMAL LOW (ref 3.5–5.1)
Sodium: 139 mmol/L (ref 135–145)
Total Bilirubin: 0.7 mg/dL (ref 0.3–1.2)
Total Protein: 7.3 g/dL (ref 6.5–8.1)

## 2022-04-07 LAB — PREGNANCY, URINE: Preg Test, Ur: NEGATIVE

## 2022-04-07 LAB — MAGNESIUM: Magnesium: 2.2 mg/dL (ref 1.7–2.4)

## 2022-04-08 ENCOUNTER — Other Ambulatory Visit: Payer: Self-pay

## 2022-04-08 ENCOUNTER — Other Ambulatory Visit (HOSPITAL_COMMUNITY): Payer: Self-pay

## 2022-04-08 ENCOUNTER — Encounter: Payer: Self-pay | Admitting: Hematology

## 2022-04-08 ENCOUNTER — Telehealth: Payer: Self-pay | Admitting: Pharmacy Technician

## 2022-04-08 NOTE — Telephone Encounter (Signed)
[name] Encounter  Prior Authorization for Revlimid has been approved.    PA# [id number] Effective dates: 04/08/22 through 04/09/23  Patient must fill at [facility].    Lady Deutscher, CPhT-Adv Oncology Pharmacy Patient [place] Direct Number: [phone]  Fax: [phone]

## 2022-04-08 NOTE — Telephone Encounter (Signed)
[name] Encounter   Received notification that prior authorization for Revlimid is required.   PA submitted on 04/08/22 Key [id number] [employer] phone [phone] Status is pending    Lady Deutscher, CPhT-Adv Oncology Pharmacy Patient [place] Direct Number: [phone]  Fax: [phone]

## 2022-04-12 ENCOUNTER — Other Ambulatory Visit: Payer: Self-pay

## 2022-04-13 LAB — MULTIPLE MYELOMA PANEL, SERUM
Albumin SerPl Elph-Mcnc: 3.8 g/dL (ref 2.9–4.4)
Albumin/Glob SerPl: 1.2 (ref 0.7–1.7)
Alpha 1: 0.2 g/dL (ref 0.0–0.4)
Alpha2 Glob SerPl Elph-Mcnc: 0.8 g/dL (ref 0.4–1.0)
B-Globulin SerPl Elph-Mcnc: 1.1 g/dL (ref 0.7–1.3)
Gamma Glob SerPl Elph-Mcnc: 1 g/dL (ref 0.4–1.8)
Globulin, Total: 3.2 g/dL (ref 2.2–3.9)
IgA: 368 mg/dL — ABNORMAL HIGH (ref 87–352)
IgG (Immunoglobin G), Serum: 950 mg/dL (ref 586–1602)
IgM (Immunoglobulin M), Srm: 22 mg/dL — ABNORMAL LOW (ref 26–217)
Total Protein ELP: 7 g/dL (ref 6.0–8.5)

## 2022-04-15 ENCOUNTER — Other Ambulatory Visit: Payer: Self-pay | Admitting: Hematology

## 2022-04-15 DIAGNOSIS — C9000 Multiple myeloma not having achieved remission: Secondary | ICD-10-CM

## 2022-04-20 ENCOUNTER — Other Ambulatory Visit: Payer: Self-pay | Admitting: Hematology

## 2022-04-20 DIAGNOSIS — C9001 Multiple myeloma in remission: Secondary | ICD-10-CM

## 2022-04-27 ENCOUNTER — Other Ambulatory Visit: Payer: Self-pay | Admitting: Hematology

## 2022-04-27 DIAGNOSIS — C9000 Multiple myeloma not having achieved remission: Secondary | ICD-10-CM

## 2022-05-02 ENCOUNTER — Other Ambulatory Visit: Payer: Self-pay | Admitting: Hematology

## 2022-05-02 DIAGNOSIS — C9001 Multiple myeloma in remission: Secondary | ICD-10-CM

## 2022-05-04 ENCOUNTER — Other Ambulatory Visit: Payer: Self-pay | Admitting: Hematology

## 2022-05-04 ENCOUNTER — Other Ambulatory Visit: Payer: Self-pay

## 2022-05-04 ENCOUNTER — Other Ambulatory Visit: Payer: Self-pay | Admitting: Physician Assistant

## 2022-05-04 DIAGNOSIS — C9001 Multiple myeloma in remission: Secondary | ICD-10-CM

## 2022-05-05 ENCOUNTER — Encounter: Payer: Self-pay | Admitting: Hematology

## 2022-05-05 ENCOUNTER — Inpatient Hospital Stay: Payer: 59 | Attending: Hematology

## 2022-05-05 ENCOUNTER — Other Ambulatory Visit: Payer: Self-pay

## 2022-05-05 DIAGNOSIS — C9000 Multiple myeloma not having achieved remission: Secondary | ICD-10-CM | POA: Diagnosis not present

## 2022-05-05 DIAGNOSIS — C9001 Multiple myeloma in remission: Secondary | ICD-10-CM

## 2022-05-05 LAB — PREGNANCY, URINE: Preg Test, Ur: NEGATIVE

## 2022-05-11 ENCOUNTER — Other Ambulatory Visit: Payer: Self-pay | Admitting: Hematology

## 2022-05-11 DIAGNOSIS — C9000 Multiple myeloma not having achieved remission: Secondary | ICD-10-CM

## 2022-05-11 DIAGNOSIS — C9001 Multiple myeloma in remission: Secondary | ICD-10-CM

## 2022-05-13 ENCOUNTER — Inpatient Hospital Stay: Payer: 59 | Admitting: Physician Assistant

## 2022-05-13 ENCOUNTER — Ambulatory Visit: Payer: 59 | Admitting: Hematology

## 2022-05-13 ENCOUNTER — Inpatient Hospital Stay: Payer: 59

## 2022-05-13 ENCOUNTER — Other Ambulatory Visit: Payer: Self-pay

## 2022-05-13 VITALS — BP 112/77 | HR 73 | Temp 98.8°F | Resp 16 | Wt 252.9 lb

## 2022-05-13 DIAGNOSIS — R197 Diarrhea, unspecified: Secondary | ICD-10-CM | POA: Diagnosis not present

## 2022-05-13 DIAGNOSIS — R110 Nausea: Secondary | ICD-10-CM

## 2022-05-13 DIAGNOSIS — C9001 Multiple myeloma in remission: Secondary | ICD-10-CM | POA: Diagnosis not present

## 2022-05-13 DIAGNOSIS — C7951 Secondary malignant neoplasm of bone: Secondary | ICD-10-CM

## 2022-05-13 DIAGNOSIS — C9000 Multiple myeloma not having achieved remission: Secondary | ICD-10-CM

## 2022-05-13 LAB — CBC WITH DIFFERENTIAL (CANCER CENTER ONLY)
Abs Immature Granulocytes: 0.05 10*3/uL (ref 0.00–0.07)
Basophils Absolute: 0 10*3/uL (ref 0.0–0.1)
Basophils Relative: 1 %
Eosinophils Absolute: 0.2 10*3/uL (ref 0.0–0.5)
Eosinophils Relative: 2 %
HCT: 39.1 % (ref 36.0–46.0)
Hemoglobin: 12.7 g/dL (ref 12.0–15.0)
Immature Granulocytes: 1 %
Lymphocytes Relative: 20 %
Lymphs Abs: 1.6 10*3/uL (ref 0.7–4.0)
MCH: 31.9 pg (ref 26.0–34.0)
MCHC: 32.5 g/dL (ref 30.0–36.0)
MCV: 98.2 fL (ref 80.0–100.0)
Monocytes Absolute: 0.9 10*3/uL (ref 0.1–1.0)
Monocytes Relative: 11 %
Neutro Abs: 5 10*3/uL (ref 1.7–7.7)
Neutrophils Relative %: 65 %
Platelet Count: 210 10*3/uL (ref 150–400)
RBC: 3.98 MIL/uL (ref 3.87–5.11)
RDW: 16.5 % — ABNORMAL HIGH (ref 11.5–15.5)
WBC Count: 7.7 10*3/uL (ref 4.0–10.5)
nRBC: 0 % (ref 0.0–0.2)

## 2022-05-13 LAB — CMP (CANCER CENTER ONLY)
ALT: 50 U/L — ABNORMAL HIGH (ref 0–44)
AST: 17 U/L (ref 15–41)
Albumin: 4.2 g/dL (ref 3.5–5.0)
Alkaline Phosphatase: 56 U/L (ref 38–126)
Anion gap: 8 (ref 5–15)
BUN: 25 mg/dL — ABNORMAL HIGH (ref 6–20)
CO2: 29 mmol/L (ref 22–32)
Calcium: 9.8 mg/dL (ref 8.9–10.3)
Chloride: 101 mmol/L (ref 98–111)
Creatinine: 1.16 mg/dL — ABNORMAL HIGH (ref 0.44–1.00)
GFR, Estimated: 60 mL/min — ABNORMAL LOW (ref >60)
Glucose, Bld: 95 mg/dL (ref 70–99)
Potassium: 3.6 mmol/L (ref 3.5–5.1)
Sodium: 138 mmol/L (ref 135–145)
Total Bilirubin: 0.5 mg/dL (ref 0.3–1.2)
Total Protein: 7.4 g/dL (ref 6.5–8.1)

## 2022-05-13 LAB — MAGNESIUM: Magnesium: 2.1 mg/dL (ref 1.7–2.4)

## 2022-05-13 MED ORDER — PROCHLORPERAZINE MALEATE 10 MG PO TABS: 10.0000 mg | ORAL_TABLET | Freq: Four times a day (QID) | ORAL | 2 refills | Status: DC | PRN

## 2022-05-13 MED ORDER — SODIUM CHLORIDE 0.9 % IV SOLN: INTRAVENOUS | Status: DC

## 2022-05-13 MED ORDER — ZOLEDRONIC ACID 4 MG/100ML IV SOLN
4.0000 mg | Freq: Once | INTRAVENOUS | Status: AC
  Administered 2022-05-13: 4 mg via INTRAVENOUS
  Filled 2022-05-13: qty 100

## 2022-05-13 NOTE — Progress Notes (Signed)
Patient has not had any major dental surgeries lately or since her dentist was last contacted per pt. Calcium 9.8 and Scr 1.16.

## 2022-05-13 NOTE — Progress Notes (Signed)
HEMATOLOGY/ONCOLOGY CLINIC NOTE  Date of Service: 05/13/2022  Patient Care Team: Celene Squibb, MD as PCP - General (Internal Medicine) Corrie Mckusick, DO as Consulting Physician (Interventional Radiology)  CHIEF COMPLAINTS/PURPOSE OF CONSULTATION:  Follow-up for continued evaluation management of myeloma  HISTORY OF PRESENTING ILLNESS:  Please see previous note for HPI.  INTERVAL HISTORY:  Stacey Montoya is a 47 y.o. female here for continued valuation and management of multiple myeloma.   She was last seen by Dr. Mcadoo Muzquiz Limbo on 03/17/2022. Ms. Hallmark reports her energy levels are fairly stable. She does have fatigue but is able to complete her ADLs on her own. Her appetite is stable and she denies any changes to her weight. She reports worsening episodes of nausea and diarrhea that occurs every 3-4 days. She is uncertain the cause of her symptoms and has tried to modify her diet without any improvement. She takes compazine as needed with improvement of nausea. She reports occasional episodes of hematochezia due to hemorrhoids. She has no other signs of bleeding or bruising. She reports dizziness when changing positions without any syncopal episodes. She denies fevers, chills, shortness of breath, chest pain or cough.   MEDICAL HISTORY:  Past Medical History:  Diagnosis Date   Abnormal Pap smear of cervix    Back pain    Cancer ([place]) 04/02/2020   multiple myeloma   Hyperlipidemia    Hypertension     SURGICAL HISTORY: Past Surgical History:  Procedure Laterality Date   CERVICAL CONIZATION W/BX  12/02/2010   Procedure: CONIZATION CERVIX WITH BIOPSY;  Surgeon: Arloa Koh;  Location: [facility];  Service: Gynecology;  Laterality: N/A;  COLD KNIFE   CESAREAN SECTION     DILATION AND CURETTAGE OF UTERUS  12/02/2010   Procedure: DILATATION AND CURETTAGE (D&C);  Surgeon: Arloa Koh;  Location: [facility];  Service: Gynecology;  Laterality: N/A;   IR RADIOLOGIST EVAL & MGMT  04/16/2020    KYPHOPLASTY Bilateral 09/05/2020   Procedure: KYPHOPLASTY THORACIC TWELVE AND LUMBAR FOUR;  Surgeon: Consuella Lose, MD;  Location: [facility];  Service: Neurosurgery;  Laterality: Bilateral;   left hand     drain - infection   WISDOM TOOTH EXTRACTION      SOCIAL HISTORY: Social History   Socioeconomic History   Marital status: Married    Spouse name: Not on file   Number of children: Not on file   Years of education: Not on file   Highest education level: Not on file  Occupational History   Not on file  Tobacco Use   Smoking status: Former    Packs/day: 0.50    Years: 20.00    Additional pack years: 0.00    Total pack years: 10.00    Types: Cigarettes   Smokeless tobacco: Never  Vaping Use   Vaping Use: Some days  Substance and Sexual Activity   Alcohol use: Not Currently   Drug use: Not Currently   Sexual activity: Not Currently    Partners: Male    Birth control/protection: Condom  Other Topics Concern   Not on file  Social History Narrative   ** Merged History Encounter **       Social Determinants of Health   Financial Resource Strain: Not on file  Food Insecurity: Not on file  Transportation Needs: Not on file  Physical Activity: Not on file  Stress: Not on file  Social Connections: Not on file  Intimate Partner Violence: Not on file  FAMILY HISTORY: Family History  Problem Relation Age of Onset   Lung cancer Maternal Grandfather    Pancreatic cancer Paternal Grandfather     ALLERGIES:  is allergic to zithromax [azithromycin].  MEDICATIONS:  Current Outpatient Medications  Medication Sig Dispense Refill   acyclovir (ZOVIRAX) 400 MG tablet TAKE (1) TABLET BY MOUTH TWICE DAILY. 60 tablet 0   albuterol (VENTOLIN HFA) 108 (90 Base) MCG/ACT inhaler Inhale 1-2 puffs into the lungs every 6 (six) hours as needed for wheezing.     b complex vitamins capsule Take 1 capsule by mouth daily. 30 capsule    cefdinir (OMNICEF) 300 MG capsule Take 300 mg by  mouth 2 (two) times daily.     ELIQUIS 5 MG TABS tablet TAKE 1 TABLET BY MOUTH TWICE DAILY. 60 tablet 0   ergocalciferol (VITAMIN D2) 1.25 MG (50000 UT) capsule Take 1 capsule (50,000 Units total) by mouth 2 (two) times a week. 12 capsule 2   folic acid (FOLVITE) 1 MG tablet TAKE 1 TABLET BY MOUTH ONCE A DAY. 90 tablet 0   furosemide (LASIX) 20 MG tablet TAKE 3 TABLETS BY MOUTH DAILY. 90 tablet 0   gabapentin (NEURONTIN) 300 MG capsule TAKE 1 CAPSULES IN THE MORNING, TAKE 2 CAPSULES IN THE AFTERNOON, AND TAKE 3 CAPSULES AT BEDTIME 180 capsule 1   lenalidomide (REVLIMID) 10 MG capsule TAKE 1 CAPSULE BY MOUTH DAILY  FOR 21 DAYS, THEN 7 DAYS OFF 21 capsule 0   magic mouthwash w/lidocaine SOLN Take 5 mLs by mouth 3 (three) times daily as needed for mouth pain. Dispense 420ml of magic mouthwash compounded preparation. Patient to use 27ml 4 times daily as needed for throat discomfort. 80 ml viscous lidocaine 2% 80 ml Mylanta 80 ml diphenhydramine at 12.5 mg per 5 ml elixir 80 ml nystatin at 100,000U per 5 mL suspension 80 ml distilled water     medroxyPROGESTERone (PROVERA) 10 MG tablet Take 10 mg by mouth daily.     nicotine (NICODERM CQ - DOSED IN MG/24 HOURS) 21 mg/24hr patch Place 1 patch (21 mg total) onto the skin daily. 28 patch 0   ondansetron (ZOFRAN) 4 MG tablet Take 8 mg by mouth every 8 (eight) hours.     oxyCODONE (OXY IR/ROXICODONE) 5 MG immediate release tablet TAKE 1 TABLET BY MOUTH EVERY 6 HOURS AS NEEDED. 60 tablet 0   polyethylene glycol (MIRALAX / GLYCOLAX) 17 g packet Take 17 g by mouth daily as needed for mild constipation.     potassium chloride SA (KLOR-CON M) 20 MEQ tablet TAKE (1) TABLET BY MOUTH TWICE DAILY. 60 tablet 0   pravastatin (PRAVACHOL) 40 MG tablet Take 40 mg by mouth daily.     predniSONE (DELTASONE) 10 MG tablet Take 10 mg by mouth. Taper over 6 days     prochlorperazine (COMPAZINE) 10 MG tablet Take 1 tablet (10 mg total) by mouth every 6 (six) hours as needed  for nausea or vomiting. 30 tablet 2   amLODipine (NORVASC) 5 MG tablet Take 1 tablet (5 mg total) by mouth daily. 30 tablet 0   metoprolol tartrate (LOPRESSOR) 50 MG tablet Take 1 tablet (50 mg total) by mouth 2 (two) times daily. 60 tablet 0   No current facility-administered medications for this visit.    REVIEW OF SYSTEMS:   10 Point review of Systems was done is negative except as noted above.   PHYSICAL EXAMINATION: ECOG PERFORMANCE STATUS: 2 - Symptomatic, <50% confined to bed .BP 123/74  Pulse 85   Temp 98.8 F (37.1 C)   Resp 20   Wt 252 lb 14.4 oz (114.7 kg)   SpO2 97%   BMI 43.41 kg/m  NAD GENERAL:alert, in no acute distress and comfortable SKIN: no acute rashes, no significant lesions EYES: conjunctiva are pink and non-injected, sclera anicteric NECK: supple, no JVD LYMPH:  no palpable lymphadenopathy in the cervical or supraclavicular regions LUNGS: clear to auscultation b/l with normal respiratory effort HEART: regular rate & rhythm Extremity: no pedal edema PSYCH: alert & oriented x 3 with fluent speech NEURO: no focal motor/sensory deficits  Exam performed in chair.  LABORATORY DATA:  I have reviewed the data as listed  .Marland Kitchen    Latest Ref Rng & Units 05/13/2022   10:02 AM 04/07/2022   10:11 AM 03/10/2022    9:46 AM  CBC  WBC 4.0 - 10.5 K/uL 7.7  5.2  3.8   Hemoglobin 12.0 - 15.0 g/dL 12.7  12.7  12.5   Hematocrit 36.0 - 46.0 % 39.1  38.8  38.3   Platelets 150 - 400 K/uL 210  195  151     .    Latest Ref Rng & Units 04/07/2022   10:11 AM 03/10/2022    9:46 AM 02/03/2022   10:14 AM  CMP  Glucose 70 - 99 mg/dL 111  105  115   BUN 6 - 20 mg/dL 15  13  19    Creatinine 0.44 - 1.00 mg/dL 1.53  1.13  1.60   Sodium 135 - 145 mmol/L 139  139  136   Potassium 3.5 - 5.1 mmol/L 3.3  3.7  3.0   Chloride 98 - 111 mmol/L 103  102  104   CO2 22 - 32 mmol/L 27  27  23    Calcium 8.9 - 10.3 mg/dL 9.6  9.7  9.3   Total Protein 6.5 - 8.1 g/dL 7.3  7.2  7.9    Total Bilirubin 0.3 - 1.2 mg/dL 0.7  0.6  0.9   Alkaline Phos 38 - 126 U/L 76  59  66   AST 15 - 41 U/L 27  23  30    ALT 0 - 44 U/L 40  36  52     RADIOGRAPHIC STUDIES: I have personally reviewed the radiological images as listed and agreed with the findings in the report. No results found.   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  #Cancer related pain due to multilevel involvement of the spine from myeloma. --Currently on oxocydone as needed   PLAN: --Reviewed labs from today that require no intervention. WBC 7.7, Hgb 12.7, Plt 210. Creatinine level has improved to 1.16. Normal calcium levels.  --Most recent myeloma labs from 04/07/2022 did not detect an M protein.  --Continue with Revlimid 10 mg 3 weeks on and 1 week off. Continue with Zometa for today --Sent refill for compazine to help with nausea --Etiology for diarrhea unknown but differentials include Revlimid therapy. Sent referral to gastroenterology to further evaluate --Suspect positional hypotension as cause of dizziness. Encouraged patient to pause between position changes.  FOLLOW UP: Return to clinic in 2 months for labs,  follow up visit with Dr. Deavion Strider Limbo and Zometa infusion   All of the patient's questions were answered with apparent satisfaction. The patient knows to call the clinic with any problems, questions or concerns.  I have spent a total of 30 minutes minutes of face-to-face and non-face-to-face time, preparing to see the patient, performing a medically appropriate examination, counseling and educating the patient,  referring and  communicating with other health care professionals, documenting clinical information in the electronic health record,and care coordination.   Dede Query PA-C Dept of Hematology and [place] at [facility] Phone: [phone]

## 2022-05-13 NOTE — Patient Instructions (Signed)

## 2022-05-18 ENCOUNTER — Other Ambulatory Visit: Payer: Self-pay | Admitting: Hematology

## 2022-05-18 DIAGNOSIS — C7951 Secondary malignant neoplasm of bone: Secondary | ICD-10-CM

## 2022-05-18 LAB — MULTIPLE MYELOMA PANEL, SERUM
Albumin SerPl Elph-Mcnc: 3.8 g/dL (ref 2.9–4.4)
Albumin/Glob SerPl: 1.3 (ref 0.7–1.7)
Alpha 1: 0.2 g/dL (ref 0.0–0.4)
Alpha2 Glob SerPl Elph-Mcnc: 0.8 g/dL (ref 0.4–1.0)
B-Globulin SerPl Elph-Mcnc: 1.1 g/dL (ref 0.7–1.3)
Gamma Glob SerPl Elph-Mcnc: 0.9 g/dL (ref 0.4–1.8)
Globulin, Total: 3 g/dL (ref 2.2–3.9)
IgA: 366 mg/dL — ABNORMAL HIGH (ref 87–352)
IgG (Immunoglobin G), Serum: 1014 mg/dL (ref 586–1602)
IgM (Immunoglobulin M), Srm: 13 mg/dL — ABNORMAL LOW (ref 26–217)
Total Protein ELP: 6.8 g/dL (ref 6.0–8.5)

## 2022-05-19 ENCOUNTER — Encounter: Payer: Self-pay | Admitting: Hematology

## 2022-05-26 ENCOUNTER — Other Ambulatory Visit: Payer: Self-pay | Admitting: Hematology

## 2022-05-26 DIAGNOSIS — C9000 Multiple myeloma not having achieved remission: Secondary | ICD-10-CM

## 2022-05-28 ENCOUNTER — Encounter: Payer: Self-pay | Admitting: Hematology

## 2022-05-28 ENCOUNTER — Other Ambulatory Visit: Payer: Self-pay | Admitting: Hematology

## 2022-05-28 DIAGNOSIS — C9000 Multiple myeloma not having achieved remission: Secondary | ICD-10-CM

## 2022-05-29 ENCOUNTER — Other Ambulatory Visit: Payer: Self-pay | Admitting: Hematology

## 2022-05-29 DIAGNOSIS — C9001 Multiple myeloma in remission: Secondary | ICD-10-CM

## 2022-05-30 ENCOUNTER — Other Ambulatory Visit: Payer: Self-pay | Admitting: Hematology

## 2022-05-30 DIAGNOSIS — C9001 Multiple myeloma in remission: Secondary | ICD-10-CM

## 2022-06-01 ENCOUNTER — Other Ambulatory Visit: Payer: Self-pay

## 2022-06-01 ENCOUNTER — Inpatient Hospital Stay: Payer: 59 | Attending: Hematology

## 2022-06-01 ENCOUNTER — Encounter: Payer: Self-pay | Admitting: Hematology

## 2022-06-01 DIAGNOSIS — C9001 Multiple myeloma in remission: Secondary | ICD-10-CM

## 2022-06-01 DIAGNOSIS — C9000 Multiple myeloma not having achieved remission: Secondary | ICD-10-CM | POA: Diagnosis present

## 2022-06-01 LAB — PREGNANCY, URINE: Preg Test, Ur: NEGATIVE

## 2022-06-02 ENCOUNTER — Other Ambulatory Visit: Payer: 59

## 2022-06-02 ENCOUNTER — Other Ambulatory Visit: Payer: Self-pay | Admitting: Hematology

## 2022-06-03 ENCOUNTER — Other Ambulatory Visit: Payer: Self-pay

## 2022-06-03 ENCOUNTER — Encounter: Payer: Self-pay | Admitting: Hematology

## 2022-06-09 ENCOUNTER — Other Ambulatory Visit: Payer: Self-pay | Admitting: Hematology

## 2022-06-09 ENCOUNTER — Other Ambulatory Visit: Payer: Self-pay | Admitting: Physician Assistant

## 2022-06-09 DIAGNOSIS — C9000 Multiple myeloma not having achieved remission: Secondary | ICD-10-CM

## 2022-06-15 ENCOUNTER — Other Ambulatory Visit: Payer: Self-pay | Admitting: Hematology

## 2022-06-15 DIAGNOSIS — C7951 Secondary malignant neoplasm of bone: Secondary | ICD-10-CM

## 2022-06-16 ENCOUNTER — Encounter: Payer: Self-pay | Admitting: Hematology

## 2022-06-19 ENCOUNTER — Other Ambulatory Visit: Payer: Self-pay | Admitting: Hematology

## 2022-06-19 DIAGNOSIS — C9001 Multiple myeloma in remission: Secondary | ICD-10-CM

## 2022-06-22 ENCOUNTER — Encounter: Payer: Self-pay | Admitting: Hematology

## 2022-06-26 ENCOUNTER — Other Ambulatory Visit: Payer: Self-pay | Admitting: *Deleted

## 2022-06-26 DIAGNOSIS — C9000 Multiple myeloma not having achieved remission: Secondary | ICD-10-CM

## 2022-06-27 ENCOUNTER — Other Ambulatory Visit: Payer: Self-pay | Admitting: Hematology

## 2022-06-27 DIAGNOSIS — C9001 Multiple myeloma in remission: Secondary | ICD-10-CM

## 2022-06-29 ENCOUNTER — Other Ambulatory Visit: Payer: Self-pay | Admitting: Hematology

## 2022-06-29 DIAGNOSIS — C9000 Multiple myeloma not having achieved remission: Secondary | ICD-10-CM

## 2022-06-29 NOTE — Telephone Encounter (Signed)
Refilled per MD ov note

## 2022-06-30 ENCOUNTER — Other Ambulatory Visit: Payer: Self-pay

## 2022-06-30 ENCOUNTER — Encounter: Payer: Self-pay | Admitting: Hematology

## 2022-06-30 ENCOUNTER — Inpatient Hospital Stay: Payer: 59 | Attending: Hematology

## 2022-06-30 DIAGNOSIS — R7989 Other specified abnormal findings of blood chemistry: Secondary | ICD-10-CM | POA: Insufficient documentation

## 2022-06-30 DIAGNOSIS — C9001 Multiple myeloma in remission: Secondary | ICD-10-CM

## 2022-06-30 DIAGNOSIS — C9000 Multiple myeloma not having achieved remission: Secondary | ICD-10-CM | POA: Diagnosis present

## 2022-06-30 LAB — CBC WITH DIFFERENTIAL (CANCER CENTER ONLY)
Abs Immature Granulocytes: 0.01 10*3/uL (ref 0.00–0.07)
Basophils Absolute: 0 10*3/uL (ref 0.0–0.1)
Basophils Relative: 1 %
Eosinophils Absolute: 0.4 10*3/uL (ref 0.0–0.5)
Eosinophils Relative: 10 %
HCT: 38.4 % (ref 36.0–46.0)
Hemoglobin: 12.9 g/dL (ref 12.0–15.0)
Immature Granulocytes: 0 %
Lymphocytes Relative: 10 %
Lymphs Abs: 0.3 10*3/uL — ABNORMAL LOW (ref 0.7–4.0)
MCH: 33.9 pg (ref 26.0–34.0)
MCHC: 33.6 g/dL (ref 30.0–36.0)
MCV: 100.8 fL — ABNORMAL HIGH (ref 80.0–100.0)
Monocytes Absolute: 0.3 10*3/uL (ref 0.1–1.0)
Monocytes Relative: 10 %
Neutro Abs: 2.3 10*3/uL (ref 1.7–7.7)
Neutrophils Relative %: 69 %
Platelet Count: 182 10*3/uL (ref 150–400)
RBC: 3.81 MIL/uL — ABNORMAL LOW (ref 3.87–5.11)
RDW: 17.5 % — ABNORMAL HIGH (ref 11.5–15.5)
WBC Count: 3.4 10*3/uL — ABNORMAL LOW (ref 4.0–10.5)
nRBC: 0 % (ref 0.0–0.2)

## 2022-06-30 LAB — CMP (CANCER CENTER ONLY)
ALT: 46 U/L — ABNORMAL HIGH (ref 0–44)
AST: 27 U/L (ref 15–41)
Albumin: 4.3 g/dL (ref 3.5–5.0)
Alkaline Phosphatase: 61 U/L (ref 38–126)
Anion gap: 10 (ref 5–15)
BUN: 16 mg/dL (ref 6–20)
CO2: 27 mmol/L (ref 22–32)
Calcium: 9.7 mg/dL (ref 8.9–10.3)
Chloride: 102 mmol/L (ref 98–111)
Creatinine: 1.22 mg/dL — ABNORMAL HIGH (ref 0.44–1.00)
GFR, Estimated: 56 mL/min — ABNORMAL LOW (ref >60)
Glucose, Bld: 110 mg/dL — ABNORMAL HIGH (ref 70–99)
Potassium: 3.5 mmol/L (ref 3.5–5.1)
Sodium: 139 mmol/L (ref 135–145)
Total Bilirubin: 0.7 mg/dL (ref 0.3–1.2)
Total Protein: 7.5 g/dL (ref 6.5–8.1)

## 2022-06-30 LAB — MAGNESIUM: Magnesium: 2 mg/dL (ref 1.7–2.4)

## 2022-06-30 LAB — PREGNANCY, URINE: Preg Test, Ur: NEGATIVE

## 2022-06-30 NOTE — Telephone Encounter (Signed)
Per ov note - Continue with Revlimid 10 mg 3 weeks on and 1 week off

## 2022-07-03 LAB — MULTIPLE MYELOMA PANEL, SERUM
Albumin SerPl Elph-Mcnc: 3.4 g/dL (ref 2.9–4.4)
Albumin/Glob SerPl: 1.1 (ref 0.7–1.7)
Alpha 1: 0.3 g/dL (ref 0.0–0.4)
Alpha2 Glob SerPl Elph-Mcnc: 0.8 g/dL (ref 0.4–1.0)
B-Globulin SerPl Elph-Mcnc: 1.3 g/dL (ref 0.7–1.3)
Gamma Glob SerPl Elph-Mcnc: 0.8 g/dL (ref 0.4–1.8)
Globulin, Total: 3.2 g/dL (ref 2.2–3.9)
IgA: 413 mg/dL — ABNORMAL HIGH (ref 87–352)
IgG (Immunoglobin G), Serum: 887 mg/dL (ref 586–1602)
IgM (Immunoglobulin M), Srm: 16 mg/dL — ABNORMAL LOW (ref 26–217)
Total Protein ELP: 6.6 g/dL (ref 6.0–8.5)

## 2022-07-06 ENCOUNTER — Other Ambulatory Visit: Payer: Self-pay | Admitting: Hematology

## 2022-07-09 ENCOUNTER — Other Ambulatory Visit: Payer: Self-pay

## 2022-07-09 DIAGNOSIS — C9000 Multiple myeloma not having achieved remission: Secondary | ICD-10-CM

## 2022-07-10 ENCOUNTER — Other Ambulatory Visit: Payer: Self-pay | Admitting: Hematology

## 2022-07-10 DIAGNOSIS — C9001 Multiple myeloma in remission: Secondary | ICD-10-CM

## 2022-07-14 ENCOUNTER — Encounter: Payer: Self-pay | Admitting: Hematology

## 2022-07-14 ENCOUNTER — Inpatient Hospital Stay: Payer: 59

## 2022-07-14 ENCOUNTER — Other Ambulatory Visit: Payer: Self-pay

## 2022-07-14 ENCOUNTER — Inpatient Hospital Stay: Payer: 59 | Admitting: Hematology

## 2022-07-14 VITALS — BP 109/83 | HR 72 | Temp 100.1°F | Resp 17 | Wt 258.1 lb

## 2022-07-14 DIAGNOSIS — C7951 Secondary malignant neoplasm of bone: Secondary | ICD-10-CM

## 2022-07-14 DIAGNOSIS — C9000 Multiple myeloma not having achieved remission: Secondary | ICD-10-CM | POA: Diagnosis not present

## 2022-07-14 DIAGNOSIS — C9001 Multiple myeloma in remission: Secondary | ICD-10-CM | POA: Diagnosis not present

## 2022-07-14 DIAGNOSIS — G893 Neoplasm related pain (acute) (chronic): Secondary | ICD-10-CM

## 2022-07-14 LAB — CMP (CANCER CENTER ONLY)
ALT: 34 U/L (ref 0–44)
AST: 18 U/L (ref 15–41)
Albumin: 4 g/dL (ref 3.5–5.0)
Alkaline Phosphatase: 50 U/L (ref 38–126)
Anion gap: 9 (ref 5–15)
BUN: 13 mg/dL (ref 6–20)
CO2: 27 mmol/L (ref 22–32)
Calcium: 8.9 mg/dL (ref 8.9–10.3)
Chloride: 103 mmol/L (ref 98–111)
Creatinine: 1.18 mg/dL — ABNORMAL HIGH (ref 0.44–1.00)
GFR, Estimated: 58 mL/min — ABNORMAL LOW (ref >60)
Glucose, Bld: 112 mg/dL — ABNORMAL HIGH (ref 70–99)
Potassium: 3.6 mmol/L (ref 3.5–5.1)
Sodium: 139 mmol/L (ref 135–145)
Total Bilirubin: 0.8 mg/dL (ref 0.3–1.2)
Total Protein: 6.7 g/dL (ref 6.5–8.1)

## 2022-07-14 LAB — CBC WITH DIFFERENTIAL (CANCER CENTER ONLY)
Abs Immature Granulocytes: 0.01 10*3/uL (ref 0.00–0.07)
Basophils Absolute: 0 10*3/uL (ref 0.0–0.1)
Basophils Relative: 1 %
Eosinophils Absolute: 0.2 10*3/uL (ref 0.0–0.5)
Eosinophils Relative: 4 %
HCT: 36.4 % (ref 36.0–46.0)
Hemoglobin: 12.3 g/dL (ref 12.0–15.0)
Immature Granulocytes: 0 %
Lymphocytes Relative: 12 %
Lymphs Abs: 0.6 10*3/uL — ABNORMAL LOW (ref 0.7–4.0)
MCH: 33.8 pg (ref 26.0–34.0)
MCHC: 33.8 g/dL (ref 30.0–36.0)
MCV: 100 fL (ref 80.0–100.0)
Monocytes Absolute: 0.5 10*3/uL (ref 0.1–1.0)
Monocytes Relative: 10 %
Neutro Abs: 3.9 10*3/uL (ref 1.7–7.7)
Neutrophils Relative %: 73 %
Platelet Count: 183 10*3/uL (ref 150–400)
RBC: 3.64 MIL/uL — ABNORMAL LOW (ref 3.87–5.11)
RDW: 17.2 % — ABNORMAL HIGH (ref 11.5–15.5)
WBC Count: 5.3 10*3/uL (ref 4.0–10.5)
nRBC: 0 % (ref 0.0–0.2)

## 2022-07-14 LAB — MAGNESIUM: Magnesium: 1.7 mg/dL (ref 1.7–2.4)

## 2022-07-14 MED ORDER — ZOLEDRONIC ACID 4 MG/100ML IV SOLN
4.0000 mg | Freq: Once | INTRAVENOUS | Status: AC
  Administered 2022-07-14: 4 mg via INTRAVENOUS
  Filled 2022-07-14: qty 100

## 2022-07-14 MED ORDER — GABAPENTIN 300 MG PO CAPS
ORAL_CAPSULE | ORAL | 3 refills | Status: DC
Start: 2022-07-14 — End: 2022-09-16

## 2022-07-14 MED ORDER — ACYCLOVIR 400 MG PO TABS: 400.0000 mg | ORAL_TABLET | Freq: Two times a day (BID) | ORAL | 5 refills | Status: DC

## 2022-07-14 MED ORDER — POTASSIUM CHLORIDE CRYS ER 20 MEQ PO TBCR
EXTENDED_RELEASE_TABLET | ORAL | 0 refills | Status: DC
Start: 2022-07-14 — End: 2022-08-10

## 2022-07-14 MED ORDER — OXYCODONE HCL 5 MG PO TABS: 5.0000 mg | ORAL_TABLET | Freq: Four times a day (QID) | ORAL | 0 refills | Status: DC | PRN

## 2022-07-14 NOTE — Patient Instructions (Signed)

## 2022-07-14 NOTE — Progress Notes (Signed)
Patient seen by Dr. Addison Naegeli are within treatment parameters.  Labs reviewed: Dr Candise Che aware CR: 1.18  Per physician team, patient is ready for treatment and there are NO modifications to the treatment plan.

## 2022-07-14 NOTE — Progress Notes (Signed)
HEMATOLOGY/ONCOLOGY CLINIC NOTE  Date of Service: 07/14/22   Patient Care Team: Benita Stabile, MD as PCP - General (Internal Medicine) Gilmer Mor, DO as Consulting Physician (Interventional Radiology)  CHIEF COMPLAINTS/PURPOSE OF CONSULTATION:  Follow-up for continued evaluation management of myeloma  HISTORY OF PRESENTING ILLNESS:  Please see previous note for HPI.  INTERVAL HISTORY:  Stacey Montoya is a 47 y.o. female here for continued valuation and management of multiple myeloma.   Patient was last seen by PA Thayil on 05/13/2022 and she complained of mild fatigue, worsening episodes of nausea and diarrhea every 3-4 days, and occasional episodes of hematochezia.   Patient is accompanied by her mother during this visit. She notes she has been doing well since our last visit. Patient does complain of dizziness when she stands and chronic neuropathy. She notes her cramping has improved since our last visit.   Patient also complains of episodes of diarrhea every 3-4 days.  She denies fever, chills, night sweats, infection issues, abdominal pain, chest pain, back pain, or leg swelling.   Patient had respiratory infection around 1-2 weeks ago, which resolved with antibiotics.   Patient regularly takes Revlimid as prescribed without any new or severe toxicities.   She denies using nicotine patches. She denies smoking cigarettes, but she is using vape.   MEDICAL HISTORY:  Past Medical History:  Diagnosis Date   Abnormal Pap smear of cervix    Back pain    Cancer ([facility]) 04/02/2020   multiple myeloma   Hyperlipidemia    Hypertension     SURGICAL HISTORY: Past Surgical History:  Procedure Laterality Date   CERVICAL CONIZATION W/BX  12/02/2010   Procedure: CONIZATION CERVIX WITH BIOPSY;  Surgeon: Melony Overly;  Location: [facility];  Service: Gynecology;  Laterality: N/A;  COLD KNIFE   CESAREAN SECTION     DILATION AND CURETTAGE OF UTERUS  12/02/2010   Procedure:  DILATATION AND CURETTAGE (D&C);  Surgeon: Melony Overly;  Location: [facility];  Service: Gynecology;  Laterality: N/A;   IR RADIOLOGIST EVAL & MGMT  04/16/2020   KYPHOPLASTY Bilateral 09/05/2020   Procedure: KYPHOPLASTY THORACIC TWELVE AND LUMBAR FOUR;  Surgeon: Lisbeth Renshaw, MD;  Location: [facility];  Service: Neurosurgery;  Laterality: Bilateral;   left hand     drain - infection   WISDOM TOOTH EXTRACTION      SOCIAL HISTORY: Social History   Socioeconomic History   Marital status: Married    Spouse name: Not on file   Number of children: Not on file   Years of education: Not on file   Highest education level: Not on file  Occupational History   Not on file  Tobacco Use   Smoking status: Former    Packs/day: 0.50    Years: 20.00    Total pack years: 10.00    Types: Cigarettes   Smokeless tobacco: Never  Vaping Use   Vaping Use: Some days  Substance and Sexual Activity   Alcohol use: Not Currently   Drug use: Not Currently   Sexual activity: Not Currently    Partners: Male    Birth control/protection: Condom  Other Topics Concern   Not on file  Social History Narrative   ** Merged History Encounter **       Social Determinants of Health   Financial Resource Strain: Not on file  Food Insecurity: Not on file  Transportation Needs: Not on file  Physical Activity: Not on file  Stress: Not on  file  Social Connections: Not on file  Intimate Partner Violence: Not on file    FAMILY HISTORY: Family History  Problem Relation Age of Onset   Lung cancer Maternal Grandfather    Pancreatic cancer Paternal Grandfather     ALLERGIES:  is allergic to zithromax [azithromycin].  MEDICATIONS:  Current Outpatient Medications  Medication Sig Dispense Refill   acyclovir (ZOVIRAX) 400 MG tablet TAKE (1) TABLET BY MOUTH TWICE DAILY. 60 tablet 0   albuterol (VENTOLIN HFA) 108 (90 Base) MCG/ACT inhaler Inhale 1-2 puffs into the lungs every 6 (six) hours as needed for wheezing.      amLODipine (NORVASC) 5 MG tablet Take 1 tablet (5 mg total) by mouth daily. 30 tablet 0   b complex vitamins capsule Take 1 capsule by mouth daily. 30 capsule    ELIQUIS 5 MG TABS tablet TAKE 1 TABLET BY MOUTH TWICE DAILY. 60 tablet 0   ergocalciferol (VITAMIN D2) 1.25 MG (50000 UT) capsule Take 1 capsule (50,000 Units total) by mouth 2 (two) times a week. 12 capsule 2   folic acid (FOLVITE) 1 MG tablet TAKE 1 TABLET BY MOUTH ONCE A DAY. 90 tablet 0   furosemide (LASIX) 20 MG tablet TAKE 3 TABLETS BY MOUTH DAILY. 90 tablet 0   gabapentin (NEURONTIN) 300 MG capsule TAKE 1 CAPSULES IN THE MORNING, TAKE 2 CAPSULES IN THE AFTERNOON, AND TAKE 3 CAPSULES AT BEDTIME 180 capsule 0   lenalidomide (REVLIMID) 10 MG capsule TAKE 1 CAPSULE (10 mg)  BY MOUTH DAILY  FOR 21 DAYS AS DIRECTED then take 7 days off. 21 capsule 0   magic mouthwash w/lidocaine SOLN Take 5 mLs by mouth 3 (three) times daily as needed for mouth pain. Dispense of magic mouthwash compounded preparation. Patient to use 5ml 4 times daily as needed for throat discomfort. 80 ml viscous lidocaine 2% 80 ml Mylanta 80 ml diphenhydramine at 12.5 mg per 5 ml elixir 80 ml nystatin at 100,000U per 5 mL suspension 80 ml distilled water     medroxyPROGESTERone (PROVERA) 10 MG tablet Take 10 mg by mouth daily.     metoprolol tartrate (LOPRESSOR) 50 MG tablet Take 1 tablet (50 mg total) by mouth 2 (two) times daily. 60 tablet 0   nicotine (NICODERM CQ - DOSED IN MG/24 HOURS) 21 mg/24hr patch Place 1 patch (21 mg total) onto the skin daily. 28 patch 0   ondansetron (ZOFRAN) 4 MG tablet Take 8 mg by mouth every 8 (eight) hours.     oxyCODONE (OXY IR/ROXICODONE) 5 MG immediate release tablet TAKE 1 TABLET BY MOUTH EVERY 6 HOURS AS NEEDED. 60 tablet 0   polyethylene glycol (MIRALAX / GLYCOLAX) 17 g packet Take 17 g by mouth daily as needed for mild constipation.     potassium chloride SA (KLOR-CON M) 20 MEQ tablet TAKE (1) TABLET BY MOUTH TWICE DAILY.  60 tablet 0   pravastatin (PRAVACHOL) 40 MG tablet Take 40 mg by mouth daily.     prochlorperazine (COMPAZINE) 10 MG tablet Take 1 tablet (10 mg total) by mouth every 6 (six) hours as needed for nausea or vomiting. 30 tablet 2   No current facility-administered medications for this visit.    REVIEW OF SYSTEMS:   . 10 Point review of Systems was done is negative except as noted above.   PHYSICAL EXAMINATION: ECOG PERFORMANCE STATUS: 2 - Symptomatic, <50% confined to bed .BP 122/74 (BP Location: Left Arm, Patient Position: Sitting)   Pulse 98  Temp 99.7 F (37.6 C) (Oral)   Resp 17   Wt 256 lb 3.2 oz (116.2 kg)   SpO2 100%   BMI 43.98 kg/m  NAD GENERAL:alert, in no acute distress and comfortable SKIN: no acute rashes, no significant lesions EYES: conjunctiva are pink and non-injected, sclera anicteric NECK: supple, no JVD LYMPH:  no palpable lymphadenopathy in the cervical, axillary or inguinal regions LUNGS: clear to auscultation b/l with normal respiratory effort HEART: regular rate & rhythm ABDOMEN:  normoactive bowel sounds , non tender, not distended. Extremity: no pedal edema PSYCH: alert & oriented x 3 with fluent speech NEURO: no focal motor/sensory deficits  Exam performed in chair.  LABORATORY DATA:  I have reviewed the data as listed  .Marland Kitchen    Latest Ref Rng & Units 07/14/2022    9:50 AM 06/30/2022   10:45 AM 05/13/2022   10:02 AM  CBC  WBC 4.0 - 10.5 K/uL 5.3  3.4  7.7   Hemoglobin 12.0 - 15.0 g/dL 16.1  09.6  04.5   Hematocrit 36.0 - 46.0 % 36.4  38.4  39.1   Platelets 150 - 400 K/uL 183  182  210     .    Latest Ref Rng & Units 07/14/2022    9:50 AM 06/30/2022   10:45 AM 05/13/2022   10:02 AM  CMP  Glucose 70 - 99 mg/dL 409  811  95   BUN 6 - 20 mg/dL 13  16  25    Creatinine 0.44 - 1.00 mg/dL 9.14  7.82  9.56   Sodium 135 - 145 mmol/L 139  139  138   Potassium 3.5 - 5.1 mmol/L 3.6  3.5  3.6   Chloride 98 - 111 mmol/L 103  102  101   CO2 22 - 32  mmol/L 27  27  29    Calcium 8.9 - 10.3 mg/dL 8.9  9.7  9.8   Total Protein 6.5 - 8.1 g/dL 6.7  7.5  7.4   Total Bilirubin 0.3 - 1.2 mg/dL 0.8  0.7  0.5   Alkaline Phos 38 - 126 U/L 50  61  56   AST 15 - 41 U/L 18  27  17    ALT 0 - 44 U/L 34  46  50     RADIOGRAPHIC STUDIES: I have personally reviewed the radiological images as listed and agreed with the findings in the report. No results found.   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  2) h/o hypercalcemia due to multiple myeloma-now resolved with IV fluids, calcitonin, pamidronate. 3) h/o anemia due to multiple myeloma becoming more apparent as the patient's hemoconcentration due to dehydration has been corrected.   4) h/o acute renal failure related to dehydration hypercalcemia and multiple myeloma.  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  PLAN: -Discussed lab results from today, 07/14/2022, with the patient. CBC is stable. CMP shows  elevated creatinine level at 1.18. Magnesium at 1.7.  -Discussed multiple myeloma panel results from 06/30/2022 with the patient. Shows slightly elevated IgA level at 413, but M-protein not observed.  -Patient notes no notable toxicities from her maintenance Revlimid and will continue maintenance Revlimid at this time at 10 mg 3 weeks on 1 week off. -Continue  Eliquis. -Continuing as needed oxycodone. -Answered all of patient's questions about myeloma and her treatment.  -Continue Zometa infusions every other month for 2 years.   -Recommend to follow-up with PCP regarding dizziness.     FOLLOW UP: Per integrated scheduling  The total time spent in the appointment was 30 minutes* .  All of the patient's questions were answered with apparent satisfaction. The patient knows to call the clinic with any problems, questions or concerns.   Wyvonnia Lora MD MS AAHIVMS [facility] Hematology/Oncology Physician [facility]  .*Total Encounter Time as defined by the Centers for Medicare and Medicaid Services includes, in addition to the face-to-face time of a patient visit (documented in the note above) non-face-to-face time: obtaining and reviewing outside history, ordering and reviewing medications, tests or procedures, care coordination (communications with other health care professionals or caregivers) and documentation in the medical record.   I, Ok Edwards, am acting as a Neurosurgeon for Wyvonnia Lora, MD. .I have reviewed the above documentation for accuracy and completeness, and I agree with the above. Johney Maine MD

## 2022-07-14 NOTE — Progress Notes (Signed)
Patient denies any recent dental procedures, jaw pain, or mouth issues.

## 2022-07-16 ENCOUNTER — Other Ambulatory Visit: Payer: Self-pay

## 2022-07-20 ENCOUNTER — Other Ambulatory Visit: Payer: Self-pay | Admitting: Hematology

## 2022-07-20 DIAGNOSIS — C9000 Multiple myeloma not having achieved remission: Secondary | ICD-10-CM

## 2022-07-22 ENCOUNTER — Other Ambulatory Visit: Payer: Self-pay | Admitting: Hematology

## 2022-07-22 ENCOUNTER — Encounter: Payer: Self-pay | Admitting: Hematology

## 2022-07-22 ENCOUNTER — Inpatient Hospital Stay: Payer: 59 | Attending: Hematology

## 2022-07-22 ENCOUNTER — Other Ambulatory Visit: Payer: Self-pay

## 2022-07-22 DIAGNOSIS — C9001 Multiple myeloma in remission: Secondary | ICD-10-CM

## 2022-07-22 DIAGNOSIS — C9000 Multiple myeloma not having achieved remission: Secondary | ICD-10-CM | POA: Diagnosis present

## 2022-07-22 LAB — PREGNANCY, URINE: Preg Test, Ur: NEGATIVE

## 2022-07-23 ENCOUNTER — Encounter: Payer: Self-pay | Admitting: Hematology

## 2022-07-23 ENCOUNTER — Other Ambulatory Visit: Payer: Self-pay

## 2022-08-03 ENCOUNTER — Other Ambulatory Visit: Payer: Self-pay | Admitting: Hematology

## 2022-08-03 DIAGNOSIS — C9001 Multiple myeloma in remission: Secondary | ICD-10-CM

## 2022-08-10 ENCOUNTER — Other Ambulatory Visit: Payer: Self-pay | Admitting: Physician Assistant

## 2022-08-10 DIAGNOSIS — C9000 Multiple myeloma not having achieved remission: Secondary | ICD-10-CM

## 2022-08-17 ENCOUNTER — Other Ambulatory Visit: Payer: Self-pay

## 2022-08-21 ENCOUNTER — Other Ambulatory Visit: Payer: Self-pay | Admitting: Hematology

## 2022-08-21 DIAGNOSIS — C9001 Multiple myeloma in remission: Secondary | ICD-10-CM

## 2022-08-24 ENCOUNTER — Other Ambulatory Visit: Payer: Self-pay | Admitting: Hematology

## 2022-08-24 ENCOUNTER — Other Ambulatory Visit: Payer: Self-pay

## 2022-08-24 DIAGNOSIS — C9001 Multiple myeloma in remission: Secondary | ICD-10-CM

## 2022-08-24 DIAGNOSIS — D509 Iron deficiency anemia, unspecified: Secondary | ICD-10-CM | POA: Diagnosis not present

## 2022-08-24 DIAGNOSIS — C9000 Multiple myeloma not having achieved remission: Secondary | ICD-10-CM

## 2022-08-25 ENCOUNTER — Encounter: Payer: Self-pay | Admitting: Hematology

## 2022-08-25 ENCOUNTER — Other Ambulatory Visit: Payer: Self-pay

## 2022-08-25 ENCOUNTER — Inpatient Hospital Stay: Payer: 59 | Attending: Hematology

## 2022-08-25 DIAGNOSIS — C9001 Multiple myeloma in remission: Secondary | ICD-10-CM

## 2022-08-25 DIAGNOSIS — D509 Iron deficiency anemia, unspecified: Secondary | ICD-10-CM | POA: Diagnosis not present

## 2022-08-25 LAB — CMP (CANCER CENTER ONLY)
ALT: 40 U/L (ref 0–44)
AST: 30 U/L (ref 15–41)
Albumin: 3.9 g/dL (ref 3.5–5.0)
Alkaline Phosphatase: 58 U/L (ref 38–126)
Anion gap: 11 (ref 5–15)
BUN: 14 mg/dL (ref 6–20)
CO2: 27 mmol/L (ref 22–32)
Calcium: 9.5 mg/dL (ref 8.9–10.3)
Chloride: 101 mmol/L (ref 98–111)
Creatinine: 1.44 mg/dL — ABNORMAL HIGH (ref 0.44–1.00)
GFR, Estimated: 46 mL/min — ABNORMAL LOW (ref >60)
Glucose, Bld: 101 mg/dL — ABNORMAL HIGH (ref 70–99)
Potassium: 3.7 mmol/L (ref 3.5–5.1)
Sodium: 139 mmol/L (ref 135–145)
Total Bilirubin: 0.7 mg/dL (ref 0.3–1.2)
Total Protein: 7.4 g/dL (ref 6.5–8.1)

## 2022-08-25 LAB — MAGNESIUM: Magnesium: 1.9 mg/dL (ref 1.7–2.4)

## 2022-08-25 LAB — CBC WITH DIFFERENTIAL (CANCER CENTER ONLY)
Abs Immature Granulocytes: 0.01 10*3/uL (ref 0.00–0.07)
Basophils Absolute: 0 10*3/uL (ref 0.0–0.1)
Basophils Relative: 0 %
Eosinophils Absolute: 0.3 10*3/uL (ref 0.0–0.5)
Eosinophils Relative: 7 %
HCT: 38 % (ref 36.0–46.0)
Hemoglobin: 12.4 g/dL (ref 12.0–15.0)
Immature Granulocytes: 0 %
Lymphocytes Relative: 13 %
Lymphs Abs: 0.6 10*3/uL — ABNORMAL LOW (ref 0.7–4.0)
MCH: 34.1 pg — ABNORMAL HIGH (ref 26.0–34.0)
MCHC: 32.6 g/dL (ref 30.0–36.0)
MCV: 104.4 fL — ABNORMAL HIGH (ref 80.0–100.0)
Monocytes Absolute: 0.3 10*3/uL (ref 0.1–1.0)
Monocytes Relative: 6 %
Neutro Abs: 3.3 10*3/uL (ref 1.7–7.7)
Neutrophils Relative %: 74 %
Platelet Count: 187 10*3/uL (ref 150–400)
RBC: 3.64 MIL/uL — ABNORMAL LOW (ref 3.87–5.11)
RDW: 17.3 % — ABNORMAL HIGH (ref 11.5–15.5)
WBC Count: 4.5 10*3/uL (ref 4.0–10.5)
nRBC: 0 % (ref 0.0–0.2)

## 2022-08-25 LAB — PREGNANCY, URINE: Preg Test, Ur: NEGATIVE

## 2022-08-31 ENCOUNTER — Other Ambulatory Visit: Payer: Self-pay

## 2022-08-31 DIAGNOSIS — C9001 Multiple myeloma in remission: Secondary | ICD-10-CM

## 2022-08-31 LAB — MULTIPLE MYELOMA PANEL, SERUM
Albumin SerPl Elph-Mcnc: 3.7 g/dL (ref 2.9–4.4)
Albumin/Glob SerPl: 1.2 (ref 0.7–1.7)
Alpha 1: 0.3 g/dL (ref 0.0–0.4)
Alpha2 Glob SerPl Elph-Mcnc: 0.7 g/dL (ref 0.4–1.0)
B-Globulin SerPl Elph-Mcnc: 1.3 g/dL (ref 0.7–1.3)
Gamma Glob SerPl Elph-Mcnc: 0.7 g/dL (ref 0.4–1.8)
Globulin, Total: 3.1 g/dL (ref 2.2–3.9)
IgA: 409 mg/dL — ABNORMAL HIGH (ref 87–352)
IgG (Immunoglobin G), Serum: 907 mg/dL (ref 586–1602)
IgM (Immunoglobulin M), Srm: 13 mg/dL — ABNORMAL LOW (ref 26–217)
Total Protein ELP: 6.8 g/dL (ref 6.0–8.5)

## 2022-08-31 MED ORDER — LENALIDOMIDE 10 MG PO CAPS
ORAL_CAPSULE | ORAL | 0 refills | Status: DC
Start: 2022-08-31 — End: 2022-09-22

## 2022-09-02 ENCOUNTER — Other Ambulatory Visit: Payer: Self-pay | Admitting: Hematology

## 2022-09-14 ENCOUNTER — Other Ambulatory Visit: Payer: Self-pay | Admitting: Hematology

## 2022-09-14 ENCOUNTER — Other Ambulatory Visit: Payer: Self-pay | Admitting: Physician Assistant

## 2022-09-14 DIAGNOSIS — C9000 Multiple myeloma not having achieved remission: Secondary | ICD-10-CM

## 2022-09-14 DIAGNOSIS — C9001 Multiple myeloma in remission: Secondary | ICD-10-CM

## 2022-09-15 ENCOUNTER — Ambulatory Visit: Payer: 59 | Admitting: Hematology

## 2022-09-15 ENCOUNTER — Other Ambulatory Visit: Payer: 59

## 2022-09-15 ENCOUNTER — Ambulatory Visit: Payer: 59

## 2022-09-16 ENCOUNTER — Inpatient Hospital Stay: Payer: 59

## 2022-09-16 ENCOUNTER — Inpatient Hospital Stay (HOSPITAL_BASED_OUTPATIENT_CLINIC_OR_DEPARTMENT_OTHER): Payer: 59 | Admitting: Physician Assistant

## 2022-09-16 ENCOUNTER — Other Ambulatory Visit: Payer: Self-pay

## 2022-09-16 VITALS — BP 144/82 | HR 90 | Temp 100.3°F | Resp 20 | Wt 261.2 lb

## 2022-09-16 DIAGNOSIS — C7951 Secondary malignant neoplasm of bone: Secondary | ICD-10-CM

## 2022-09-16 DIAGNOSIS — C9001 Multiple myeloma in remission: Secondary | ICD-10-CM | POA: Diagnosis not present

## 2022-09-16 DIAGNOSIS — C9000 Multiple myeloma not having achieved remission: Secondary | ICD-10-CM

## 2022-09-16 DIAGNOSIS — D509 Iron deficiency anemia, unspecified: Secondary | ICD-10-CM | POA: Diagnosis not present

## 2022-09-16 LAB — CBC WITH DIFFERENTIAL (CANCER CENTER ONLY)
Abs Immature Granulocytes: 0.02 10*3/uL (ref 0.00–0.07)
Basophils Absolute: 0 10*3/uL (ref 0.0–0.1)
Basophils Relative: 1 %
Eosinophils Absolute: 0.3 10*3/uL (ref 0.0–0.5)
Eosinophils Relative: 4 %
HCT: 37.8 % (ref 36.0–46.0)
Hemoglobin: 12.9 g/dL (ref 12.0–15.0)
Immature Granulocytes: 0 %
Lymphocytes Relative: 11 %
Lymphs Abs: 0.7 10*3/uL (ref 0.7–4.0)
MCH: 34.7 pg — ABNORMAL HIGH (ref 26.0–34.0)
MCHC: 34.1 g/dL (ref 30.0–36.0)
MCV: 101.6 fL — ABNORMAL HIGH (ref 80.0–100.0)
Monocytes Absolute: 0.8 10*3/uL (ref 0.1–1.0)
Monocytes Relative: 12 %
Neutro Abs: 4.8 10*3/uL (ref 1.7–7.7)
Neutrophils Relative %: 72 %
Platelet Count: 190 10*3/uL (ref 150–400)
RBC: 3.72 MIL/uL — ABNORMAL LOW (ref 3.87–5.11)
RDW: 16.1 % — ABNORMAL HIGH (ref 11.5–15.5)
WBC Count: 6.7 10*3/uL (ref 4.0–10.5)
nRBC: 0 % (ref 0.0–0.2)

## 2022-09-16 LAB — CMP (CANCER CENTER ONLY)
ALT: 44 U/L (ref 0–44)
AST: 28 U/L (ref 15–41)
Albumin: 4.4 g/dL (ref 3.5–5.0)
Alkaline Phosphatase: 56 U/L (ref 38–126)
Anion gap: 12 (ref 5–15)
BUN: 16 mg/dL (ref 6–20)
CO2: 25 mmol/L (ref 22–32)
Calcium: 10.1 mg/dL (ref 8.9–10.3)
Chloride: 100 mmol/L (ref 98–111)
Creatinine: 1.21 mg/dL — ABNORMAL HIGH (ref 0.44–1.00)
GFR, Estimated: 56 mL/min — ABNORMAL LOW (ref >60)
Glucose, Bld: 122 mg/dL — ABNORMAL HIGH (ref 70–99)
Potassium: 3.4 mmol/L — ABNORMAL LOW (ref 3.5–5.1)
Sodium: 137 mmol/L (ref 135–145)
Total Bilirubin: 0.6 mg/dL (ref 0.3–1.2)
Total Protein: 7.5 g/dL (ref 6.5–8.1)

## 2022-09-16 MED ORDER — ZOLEDRONIC ACID 4 MG/100ML IV SOLN
4.0000 mg | Freq: Once | INTRAVENOUS | Status: AC
  Administered 2022-09-16: 4 mg via INTRAVENOUS
  Filled 2022-09-16: qty 100

## 2022-09-16 MED ORDER — SODIUM CHLORIDE 0.9 % IV SOLN: INTRAVENOUS | Status: DC

## 2022-09-16 MED ORDER — GABAPENTIN 300 MG PO CAPS: ORAL_CAPSULE | ORAL | 3 refills | Status: DC

## 2022-09-16 NOTE — Progress Notes (Signed)
OK to treat today with Cr 1.21 per Georga Kaufmann, PA-C.

## 2022-09-16 NOTE — Patient Instructions (Signed)

## 2022-09-16 NOTE — Progress Notes (Signed)
HEMATOLOGY/ONCOLOGY CLINIC NOTE  Date of Service: 09/16/2022  Patient Care Team: Benita Stabile, MD as PCP - General (Internal Medicine) Gilmer Mor, DO as Consulting Physician (Interventional Radiology)  CHIEF COMPLAINTS/PURPOSE OF CONSULTATION:  Follow-up for continued evaluation management of myeloma  INTERVAL HISTORY: Stacey Montoya is a 47 y.o. female here for continued valuation and management of multiple myeloma. She was last seen by Dr. Candise Che on 07/14/2022.    Stacey Montoya reports that she is overall feeling well without any new or concerning symptoms. She reports her dizziness greatly improved after switching her amlodipine intake from morning to evening. She continues to have periodic episodes of nausea and diarrhea that lasts 3-4 days at a time. She reports the nausea does improve with her prescribed antiemetics. She denies easy bruising or signs of active bleeding. She denies fevers, chills, shortness of breath, chest pain or cough. She has no other complaints.   MEDICAL HISTORY:  Past Medical History:  Diagnosis Date   Abnormal Pap smear of cervix    Back pain    Cancer (HCC) 04/02/2020   multiple myeloma   Hyperlipidemia    Hypertension     SURGICAL HISTORY: Past Surgical History:  Procedure Laterality Date   CERVICAL CONIZATION W/BX  12/02/2010   Procedure: CONIZATION CERVIX WITH BIOPSY;  Surgeon: Melony Overly;  Location: [facility];  Service: Gynecology;  Laterality: N/A;  COLD KNIFE   CESAREAN SECTION     DILATION AND CURETTAGE OF UTERUS  12/02/2010   Procedure: DILATATION AND CURETTAGE (D&C);  Surgeon: Melony Overly;  Location: [facility];  Service: Gynecology;  Laterality: N/A;   IR RADIOLOGIST EVAL & MGMT  04/16/2020   KYPHOPLASTY Bilateral 09/05/2020   Procedure: KYPHOPLASTY THORACIC TWELVE AND LUMBAR FOUR;  Surgeon: Lisbeth Renshaw, MD;  Location: [facility];  Service: Neurosurgery;  Laterality: Bilateral;   left hand     drain - infection   WISDOM TOOTH  EXTRACTION      SOCIAL HISTORY: Social History   Socioeconomic History   Marital status: Married    Spouse name: Not on file   Number of children: Not on file   Years of education: Not on file   Highest education level: Not on file  Occupational History   Not on file  Tobacco Use   Smoking status: Former    Current packs/day: 0.50    Average packs/day: 0.5 packs/day for 20.0 years (10.0 ttl pk-yrs)    Types: Cigarettes   Smokeless tobacco: Never  Vaping Use   Vaping status: Some Days  Substance and Sexual Activity   Alcohol use: Not Currently   Drug use: Not Currently   Sexual activity: Not Currently    Partners: Male    Birth control/protection: Condom  Other Topics Concern   Not on file  Social History Narrative   ** Merged History Encounter **       Social Determinants of Health   Financial Resource Strain: Not on file  Food Insecurity: Not on file  Transportation Needs: Not on file  Physical Activity: Not on file  Stress: Not on file  Social Connections: Not on file  Intimate Partner Violence: Not on file    FAMILY HISTORY: Family History  Problem Relation Age of Onset   Lung cancer Maternal Grandfather    Pancreatic cancer Paternal Grandfather     ALLERGIES:  is allergic to zithromax [azithromycin].  MEDICATIONS:  Current Outpatient Medications  Medication Sig Dispense Refill   acyclovir (ZOVIRAX)  400 MG tablet Take 1 tablet (400 mg total) by mouth 2 (two) times daily. 60 tablet 5   albuterol (VENTOLIN HFA) 108 (90 Base) MCG/ACT inhaler Inhale 1-2 puffs into the lungs every 6 (six) hours as needed for wheezing.     b complex vitamins capsule Take 1 capsule by mouth daily. 30 capsule    ELIQUIS 5 MG TABS tablet TAKE 1 TABLET BY MOUTH TWICE DAILY. 60 tablet 0   ergocalciferol (VITAMIN D2) 1.25 MG (50000 UT) capsule Take 1 capsule (50,000 Units total) by mouth 2 (two) times a week. 12 capsule 2   folic acid (FOLVITE) 1 MG tablet TAKE 1 TABLET BY MOUTH  ONCE A DAY. 90 tablet 0   furosemide (LASIX) 20 MG tablet TAKE 3 TABLETS BY MOUTH DAILY. 90 tablet 0   gabapentin (NEURONTIN) 300 MG capsule TAKE 1 CAPSULES IN THE MORNING, TAKE 2 CAPSULES IN THE AFTERNOON, AND TAKE 3 CAPSULES AT BEDTIME 180 capsule 3   lenalidomide (REVLIMID) 10 MG capsule TAKE 1 CAPSULE BY MOUTH DAILY  FOR 21 DAYS, THEN 7 DAYS OFF 21 capsule 0   magic mouthwash w/lidocaine SOLN Take 5 mLs by mouth 3 (three) times daily as needed for mouth pain. Dispense of magic mouthwash compounded preparation. Patient to use 5ml 4 times daily as needed for throat discomfort. 80 ml viscous lidocaine 2% 80 ml Mylanta 80 ml diphenhydramine at 12.5 mg per 5 ml elixir 80 ml nystatin at 100,000U per 5 mL suspension 80 ml distilled water     medroxyPROGESTERone (PROVERA) 10 MG tablet Take 10 mg by mouth daily.     ondansetron (ZOFRAN) 4 MG tablet Take 8 mg by mouth every 8 (eight) hours.     oxyCODONE (OXY IR/ROXICODONE) 5 MG immediate release tablet TAKE 1 TABLET EVERY 6 HOURS AS NEEDED FOR PAIN. 60 tablet 0   polyethylene glycol (MIRALAX / GLYCOLAX) 17 g packet Take 17 g by mouth daily as needed for mild constipation.     potassium chloride SA (KLOR-CON M) 20 MEQ tablet TAKE (1) TABLET BY MOUTH TWICE DAILY. 60 tablet 0   pravastatin (PRAVACHOL) 40 MG tablet Take 40 mg by mouth daily.     predniSONE (DELTASONE) 10 MG tablet Take 10 mg by mouth. Taper over 6 days     prochlorperazine (COMPAZINE) 10 MG tablet Take 1 tablet (10 mg total) by mouth every 6 (six) hours as needed for nausea or vomiting. 90 tablet 2   amLODipine (NORVASC) 5 MG tablet Take 1 tablet (5 mg total) by mouth daily. (Patient taking differently: Take 5 mg by mouth at bedtime.) 30 tablet 0   metoprolol tartrate (LOPRESSOR) 50 MG tablet Take 1 tablet (50 mg total) by mouth 2 (two) times daily. 60 tablet 0   No current facility-administered medications for this visit.    REVIEW OF SYSTEMS:   10 Point review of Systems was  done is negative except as noted above.   PHYSICAL EXAMINATION: ECOG PERFORMANCE STATUS: 2 - Symptomatic, <50% confined to bed .BP (!) 144/82   Pulse 90   Temp 100.3 F (37.9 C)   Resp 20   Wt 261 lb 3.2 oz (118.5 kg)   SpO2 (!) 9%   BMI 44.83 kg/m  NAD GENERAL:alert, in no acute distress and comfortable SKIN: no acute rashes, no significant lesions EYES: conjunctiva are pink and non-injected, sclera anicteric NECK: supple, no JVD LYMPH:  no palpable lymphadenopathy in the cervical or supraclavicular regions LUNGS: clear to auscultation b/l  with normal respiratory effort HEART: regular rate & rhythm Extremity: no pedal edema PSYCH: alert & oriented x 3 with fluent speech NEURO: no focal motor/sensory deficits  Exam performed in chair.  LABORATORY DATA:  I have reviewed the data as listed  .Marland Kitchen    Latest Ref Rng & Units 09/16/2022    8:33 AM 08/24/2022   10:28 AM 07/14/2022    9:50 AM  CBC  WBC 4.0 - 10.5 K/uL 6.7  4.5  5.3   Hemoglobin 12.0 - 15.0 g/dL 93.2  35.5  73.2   Hematocrit 36.0 - 46.0 % 37.8  38.0  36.4   Platelets 150 - 400 K/uL 190  187  183     .    Latest Ref Rng & Units 08/24/2022   10:28 AM 07/14/2022    9:50 AM 06/30/2022   10:45 AM  CMP  Glucose 70 - 99 mg/dL 202  542  706   BUN 6 - 20 mg/dL 14  13  16    Creatinine 0.44 - 1.00 mg/dL 2.37  6.28  3.15   Sodium 135 - 145 mmol/L 139  139  139   Potassium 3.5 - 5.1 mmol/L 3.7  3.6  3.5   Chloride 98 - 111 mmol/L 101  103  102   CO2 22 - 32 mmol/L 27  27  27    Calcium 8.9 - 10.3 mg/dL 9.5  8.9  9.7   Total Protein 6.5 - 8.1 g/dL 7.4  6.7  7.5   Total Bilirubin 0.3 - 1.2 mg/dL 0.7  0.8  0.7   Alkaline Phos 38 - 126 U/L 58  50  61   AST 15 - 41 U/L 30  18  27    ALT 0 - 44 U/L 40  34  46     RADIOGRAPHIC STUDIES: I have personally reviewed the radiological images as listed and agreed with the findings in the report. No results found.   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  #Cancer related pain due to multilevel involvement of the spine from myeloma. --Currently on oxocydone as needed   PLAN: --Reviewed labs from today that require no intervention. WBC 6.7, Hgb 12.9, Plt 190. Creatinine stable at 1.21. Calcium levels normal. LFTs normal. Today's myeloma labs pending.  --Most recent myeloma labs from 08/24/2022 did not detect an M protein.  --Continue with Revlimid 10 mg 3 weeks on and 1 week off. Continue with Zometa infusion scheduled for today --Refill gabapentin today.  --Continue PO potassium, today's potassium level is 3.4.   FOLLOW UP: Return to clinic in 2 months for labs, follow up visit with Dr. Candise Che and Zometa infusion   All of the patient's questions were answered with apparent satisfaction. The patient knows to call the clinic with any problems, questions or concerns.  I have spent a total of 30 minutes minutes of face-to-face and non-face-to-face time, preparing to see the patient, performing a medically appropriate examination, counseling and educating the patient,  referring and communicating with other health care professionals, documenting clinical information in the electronic health record,and care coordination.   Georga Kaufmann PA-C Dept of Hematology and Oncology  [facility] Phone: [phone]

## 2022-09-21 ENCOUNTER — Other Ambulatory Visit: Payer: Self-pay | Admitting: Hematology

## 2022-09-21 DIAGNOSIS — C9001 Multiple myeloma in remission: Secondary | ICD-10-CM

## 2022-09-22 ENCOUNTER — Encounter: Payer: Self-pay | Admitting: Hematology

## 2022-09-22 ENCOUNTER — Other Ambulatory Visit: Payer: Self-pay | Admitting: Hematology

## 2022-09-22 ENCOUNTER — Other Ambulatory Visit: Payer: Self-pay

## 2022-09-22 ENCOUNTER — Inpatient Hospital Stay: Payer: Medicare Other | Attending: Hematology

## 2022-09-22 DIAGNOSIS — C9000 Multiple myeloma not having achieved remission: Secondary | ICD-10-CM | POA: Diagnosis present

## 2022-09-22 DIAGNOSIS — C9001 Multiple myeloma in remission: Secondary | ICD-10-CM

## 2022-09-22 LAB — PREGNANCY, URINE: Preg Test, Ur: NEGATIVE

## 2022-10-02 ENCOUNTER — Other Ambulatory Visit: Payer: Self-pay | Admitting: Hematology

## 2022-10-02 DIAGNOSIS — C9001 Multiple myeloma in remission: Secondary | ICD-10-CM

## 2022-10-05 ENCOUNTER — Inpatient Hospital Stay (HOSPITAL_COMMUNITY)
Admission: EM | Admit: 2022-10-05 | Discharge: 2022-10-19 | DRG: 193 | Disposition: A | Discharge status: Home / Self Care | Payer: 59 | Attending: Internal Medicine | Admitting: Internal Medicine

## 2022-10-05 ENCOUNTER — Emergency Department (HOSPITAL_COMMUNITY): Payer: 59

## 2022-10-05 ENCOUNTER — Encounter (HOSPITAL_COMMUNITY): Payer: Self-pay

## 2022-10-05 ENCOUNTER — Other Ambulatory Visit: Payer: Self-pay | Admitting: Hematology

## 2022-10-05 ENCOUNTER — Inpatient Hospital Stay (HOSPITAL_COMMUNITY): Payer: 59

## 2022-10-05 ENCOUNTER — Other Ambulatory Visit: Payer: Self-pay

## 2022-10-05 DIAGNOSIS — N1831 Chronic kidney disease, stage 3a: Secondary | ICD-10-CM | POA: Diagnosis present

## 2022-10-05 DIAGNOSIS — Z7969 Long term (current) use of other immunomodulators and immunosuppressants: Secondary | ICD-10-CM

## 2022-10-05 DIAGNOSIS — D630 Anemia in neoplastic disease: Secondary | ICD-10-CM | POA: Diagnosis present

## 2022-10-05 DIAGNOSIS — E876 Hypokalemia: Secondary | ICD-10-CM | POA: Diagnosis present

## 2022-10-05 DIAGNOSIS — C9001 Multiple myeloma in remission: Secondary | ICD-10-CM | POA: Diagnosis present

## 2022-10-05 DIAGNOSIS — R7989 Other specified abnormal findings of blood chemistry: Secondary | ICD-10-CM | POA: Diagnosis present

## 2022-10-05 DIAGNOSIS — J9601 Acute respiratory failure with hypoxia: Secondary | ICD-10-CM | POA: Diagnosis present

## 2022-10-05 DIAGNOSIS — E66813 Obesity, class 3: Secondary | ICD-10-CM | POA: Diagnosis present

## 2022-10-05 DIAGNOSIS — E8770 Fluid overload, unspecified: Secondary | ICD-10-CM | POA: Diagnosis present

## 2022-10-05 DIAGNOSIS — D72819 Decreased white blood cell count, unspecified: Secondary | ICD-10-CM | POA: Diagnosis present

## 2022-10-05 DIAGNOSIS — Z7901 Long term (current) use of anticoagulants: Secondary | ICD-10-CM

## 2022-10-05 DIAGNOSIS — J918 Pleural effusion in other conditions classified elsewhere: Secondary | ICD-10-CM | POA: Diagnosis present

## 2022-10-05 DIAGNOSIS — Z9484 Stem cells transplant status: Secondary | ICD-10-CM | POA: Diagnosis not present

## 2022-10-05 DIAGNOSIS — Z1152 Encounter for screening for COVID-19: Secondary | ICD-10-CM

## 2022-10-05 DIAGNOSIS — C9000 Multiple myeloma not having achieved remission: Secondary | ICD-10-CM | POA: Diagnosis not present

## 2022-10-05 DIAGNOSIS — E8721 Acute metabolic acidosis: Secondary | ICD-10-CM | POA: Diagnosis present

## 2022-10-05 DIAGNOSIS — D696 Thrombocytopenia, unspecified: Secondary | ICD-10-CM | POA: Diagnosis present

## 2022-10-05 DIAGNOSIS — J189 Pneumonia, unspecified organism: Secondary | ICD-10-CM | POA: Diagnosis present

## 2022-10-05 DIAGNOSIS — N179 Acute kidney failure, unspecified: Secondary | ICD-10-CM | POA: Diagnosis not present

## 2022-10-05 DIAGNOSIS — N170 Acute kidney failure with tubular necrosis: Secondary | ICD-10-CM | POA: Diagnosis present

## 2022-10-05 DIAGNOSIS — D649 Anemia, unspecified: Secondary | ICD-10-CM | POA: Diagnosis present

## 2022-10-05 DIAGNOSIS — D849 Immunodeficiency, unspecified: Secondary | ICD-10-CM | POA: Diagnosis present

## 2022-10-05 DIAGNOSIS — D631 Anemia in chronic kidney disease: Secondary | ICD-10-CM | POA: Diagnosis present

## 2022-10-05 DIAGNOSIS — E785 Hyperlipidemia, unspecified: Secondary | ICD-10-CM | POA: Diagnosis present

## 2022-10-05 DIAGNOSIS — Z888 Allergy status to other drugs, medicaments and biological substances status: Secondary | ICD-10-CM

## 2022-10-05 DIAGNOSIS — Z87891 Personal history of nicotine dependence: Secondary | ICD-10-CM

## 2022-10-05 DIAGNOSIS — Z9221 Personal history of antineoplastic chemotherapy: Secondary | ICD-10-CM

## 2022-10-05 DIAGNOSIS — Z86718 Personal history of other venous thrombosis and embolism: Secondary | ICD-10-CM | POA: Diagnosis not present

## 2022-10-05 DIAGNOSIS — I129 Hypertensive chronic kidney disease with stage 1 through stage 4 chronic kidney disease, or unspecified chronic kidney disease: Secondary | ICD-10-CM | POA: Diagnosis present

## 2022-10-05 DIAGNOSIS — Z6841 Body Mass Index (BMI) 40.0 and over, adult: Secondary | ICD-10-CM

## 2022-10-05 DIAGNOSIS — J90 Pleural effusion, not elsewhere classified: Secondary | ICD-10-CM | POA: Diagnosis not present

## 2022-10-05 DIAGNOSIS — Z881 Allergy status to other antibiotic agents status: Secondary | ICD-10-CM

## 2022-10-05 DIAGNOSIS — E871 Hypo-osmolality and hyponatremia: Secondary | ICD-10-CM | POA: Diagnosis present

## 2022-10-05 DIAGNOSIS — Z79899 Other long term (current) drug therapy: Secondary | ICD-10-CM

## 2022-10-05 DIAGNOSIS — R000 Tachycardia, unspecified: Secondary | ICD-10-CM | POA: Diagnosis present

## 2022-10-05 DIAGNOSIS — E8809 Other disorders of plasma-protein metabolism, not elsewhere classified: Secondary | ICD-10-CM | POA: Diagnosis present

## 2022-10-05 DIAGNOSIS — R0902 Hypoxemia: Principal | ICD-10-CM

## 2022-10-05 DIAGNOSIS — R34 Anuria and oliguria: Secondary | ICD-10-CM | POA: Diagnosis present

## 2022-10-05 DIAGNOSIS — R3589 Other polyuria: Secondary | ICD-10-CM | POA: Diagnosis present

## 2022-10-05 DIAGNOSIS — K760 Fatty (change of) liver, not elsewhere classified: Secondary | ICD-10-CM | POA: Diagnosis present

## 2022-10-05 DIAGNOSIS — Z79890 Hormone replacement therapy (postmenopausal): Secondary | ICD-10-CM

## 2022-10-05 DIAGNOSIS — R0600 Dyspnea, unspecified: Secondary | ICD-10-CM | POA: Diagnosis present

## 2022-10-05 LAB — HCG, SERUM, QUALITATIVE: Preg, Serum: NEGATIVE

## 2022-10-05 LAB — BODY FLUID CELL COUNT WITH DIFFERENTIAL
Eos, Fluid: 0 %
Lymphs, Fluid: 3 %
Monocyte-Macrophage-Serous Fluid: 1 % — ABNORMAL LOW (ref 50–90)
Neutrophil Count, Fluid: 94 % — ABNORMAL HIGH (ref 0–25)
Other Cells, Fluid: 2 %
Total Nucleated Cell Count, Fluid: 249 cu mm (ref 0–1000)

## 2022-10-05 LAB — CBC WITH DIFFERENTIAL/PLATELET
Abs Immature Granulocytes: 0.08 10*3/uL — ABNORMAL HIGH (ref 0.00–0.07)
Basophils Absolute: 0 10*3/uL (ref 0.0–0.1)
Basophils Relative: 0 %
Eosinophils Absolute: 0.1 10*3/uL (ref 0.0–0.5)
Eosinophils Relative: 2 %
HCT: 35.2 % — ABNORMAL LOW (ref 36.0–46.0)
Hemoglobin: 11.6 g/dL — ABNORMAL LOW (ref 12.0–15.0)
Immature Granulocytes: 1 %
Lymphocytes Relative: 8 %
Lymphs Abs: 0.6 10*3/uL — ABNORMAL LOW (ref 0.7–4.0)
MCH: 33.6 pg (ref 26.0–34.0)
MCHC: 33 g/dL (ref 30.0–36.0)
MCV: 102 fL — ABNORMAL HIGH (ref 80.0–100.0)
Monocytes Absolute: 0.5 10*3/uL (ref 0.1–1.0)
Monocytes Relative: 8 %
Neutro Abs: 5.3 10*3/uL (ref 1.7–7.7)
Neutrophils Relative %: 81 %
Platelets: 154 10*3/uL (ref 150–400)
RBC: 3.45 MIL/uL — ABNORMAL LOW (ref 3.87–5.11)
RDW: 16.1 % — ABNORMAL HIGH (ref 11.5–15.5)
WBC: 6.7 10*3/uL (ref 4.0–10.5)
nRBC: 0 % (ref 0.0–0.2)

## 2022-10-05 LAB — COMPREHENSIVE METABOLIC PANEL
ALT: 171 U/L — ABNORMAL HIGH (ref 0–44)
AST: 78 U/L — ABNORMAL HIGH (ref 15–41)
Albumin: 3.1 g/dL — ABNORMAL LOW (ref 3.5–5.0)
Alkaline Phosphatase: 135 U/L — ABNORMAL HIGH (ref 38–126)
Anion gap: 15 (ref 5–15)
BUN: 33 mg/dL — ABNORMAL HIGH (ref 6–20)
CO2: 17 mmol/L — ABNORMAL LOW (ref 22–32)
Calcium: 8.8 mg/dL — ABNORMAL LOW (ref 8.9–10.3)
Chloride: 96 mmol/L — ABNORMAL LOW (ref 98–111)
Creatinine, Ser: 4.24 mg/dL — ABNORMAL HIGH (ref 0.44–1.00)
GFR, Estimated: 13 mL/min — ABNORMAL LOW (ref >60)
Glucose, Bld: 120 mg/dL — ABNORMAL HIGH (ref 70–99)
Potassium: 3.2 mmol/L — ABNORMAL LOW (ref 3.5–5.1)
Sodium: 128 mmol/L — ABNORMAL LOW (ref 135–145)
Total Bilirubin: 1.3 mg/dL — ABNORMAL HIGH (ref 0.3–1.2)
Total Protein: 7.3 g/dL (ref 6.5–8.1)

## 2022-10-05 LAB — GLUCOSE, PLEURAL OR PERITONEAL FLUID: Glucose, Fluid: 74 mg/dL

## 2022-10-05 LAB — RESP PANEL BY RT-PCR (RSV, FLU A&B, COVID)  RVPGX2
Influenza A by PCR: NEGATIVE
Influenza B by PCR: NEGATIVE
Resp Syncytial Virus by PCR: NEGATIVE
SARS Coronavirus 2 by RT PCR: NEGATIVE

## 2022-10-05 LAB — BRAIN NATRIURETIC PEPTIDE: B Natriuretic Peptide: 112.3 pg/mL — ABNORMAL HIGH (ref 0.0–100.0)

## 2022-10-05 LAB — LACTATE DEHYDROGENASE, PLEURAL OR PERITONEAL FLUID: LD, Fluid: 401 U/L — ABNORMAL HIGH (ref 3–23)

## 2022-10-05 LAB — PROTEIN, PLEURAL OR PERITONEAL FLUID: Total protein, fluid: 4.7 g/dL

## 2022-10-05 MED ORDER — SODIUM CHLORIDE 0.9 % IV SOLN
100.0000 mg | Freq: Two times a day (BID) | INTRAVENOUS | Status: AC
  Administered 2022-10-05 – 2022-10-10 (×8): 100 mg via INTRAVENOUS
  Filled 2022-10-05 (×14): qty 100

## 2022-10-05 MED ORDER — OXYCODONE HCL 5 MG PO TABS
5.0000 mg | ORAL_TABLET | Freq: Four times a day (QID) | ORAL | Status: DC | PRN
  Administered 2022-10-05: 5 mg via ORAL
  Filled 2022-10-05: qty 1

## 2022-10-05 MED ORDER — HYDROMORPHONE HCL 1 MG/ML IJ SOLN
0.5000 mg | INTRAMUSCULAR | Status: DC | PRN
  Administered 2022-10-06 – 2022-10-09 (×8): 1 mg via INTRAVENOUS
  Filled 2022-10-05 (×8): qty 1

## 2022-10-05 MED ORDER — NICOTINE 21 MG/24HR TD PT24
21.0000 mg | MEDICATED_PATCH | TRANSDERMAL | Status: DC
  Administered 2022-10-05 – 2022-10-13 (×9): 21 mg via TRANSDERMAL
  Filled 2022-10-05 (×11): qty 1

## 2022-10-05 MED ORDER — FLUTICASONE FUROATE-VILANTEROL 200-25 MCG/ACT IN AEPB
1.0000 | INHALATION_SPRAY | Freq: Every day | RESPIRATORY_TRACT | Status: DC
  Filled 2022-10-05: qty 28

## 2022-10-05 MED ORDER — MEDROXYPROGESTERONE ACETATE 10 MG PO TABS
10.0000 mg | ORAL_TABLET | Freq: Every day | ORAL | Status: DC
  Administered 2022-10-06 – 2022-10-19 (×14): 10 mg via ORAL
  Filled 2022-10-05 (×14): qty 1

## 2022-10-05 MED ORDER — GABAPENTIN 300 MG PO CAPS: 300.0000 mg | ORAL_CAPSULE | ORAL | Status: DC

## 2022-10-05 MED ORDER — SODIUM CHLORIDE 0.9 % IV SOLN
2.0000 g | Freq: Every day | INTRAVENOUS | Status: DC
  Administered 2022-10-05 – 2022-10-17 (×13): 2 g via INTRAVENOUS
  Filled 2022-10-05 (×13): qty 20

## 2022-10-05 MED ORDER — TRAZODONE HCL 50 MG PO TABS
25.0000 mg | ORAL_TABLET | Freq: Every evening | ORAL | Status: DC | PRN
  Administered 2022-10-06 – 2022-10-11 (×3): 25 mg via ORAL
  Filled 2022-10-05 (×3): qty 1

## 2022-10-05 MED ORDER — ACETAMINOPHEN 325 MG PO TABS
650.0000 mg | ORAL_TABLET | Freq: Four times a day (QID) | ORAL | Status: DC | PRN
  Administered 2022-10-18 – 2022-10-19 (×3): 650 mg via ORAL
  Filled 2022-10-05 (×4): qty 2

## 2022-10-05 MED ORDER — FUROSEMIDE 40 MG PO TABS: 20.0000 mg | ORAL_TABLET | Freq: Three times a day (TID) | ORAL | Status: DC

## 2022-10-05 MED ORDER — POTASSIUM CHLORIDE CRYS ER 20 MEQ PO TBCR
20.0000 meq | EXTENDED_RELEASE_TABLET | Freq: Two times a day (BID) | ORAL | Status: DC
  Administered 2022-10-05 – 2022-10-09 (×9): 20 meq via ORAL
  Filled 2022-10-05 (×9): qty 1

## 2022-10-05 MED ORDER — LENALIDOMIDE 10 MG PO CAPS: 10.0000 mg | ORAL_CAPSULE | ORAL | Status: DC

## 2022-10-05 MED ORDER — ALBUTEROL SULFATE (2.5 MG/3ML) 0.083% IN NEBU
2.5000 mg | INHALATION_SOLUTION | RESPIRATORY_TRACT | Status: DC | PRN
  Administered 2022-10-05 – 2022-10-07 (×3): 2.5 mg via RESPIRATORY_TRACT
  Filled 2022-10-05 (×4): qty 3

## 2022-10-05 MED ORDER — ONDANSETRON HCL 4 MG/2ML IJ SOLN
4.0000 mg | Freq: Four times a day (QID) | INTRAMUSCULAR | Status: DC | PRN
  Administered 2022-10-06: 4 mg via INTRAVENOUS
  Filled 2022-10-05: qty 2

## 2022-10-05 MED ORDER — LEVOFLOXACIN IN D5W 500 MG/100ML IV SOLN: 500.0000 mg | INTRAVENOUS | Status: DC

## 2022-10-05 MED ORDER — SODIUM CHLORIDE 0.9 % IV SOLN: INTRAVENOUS | Status: AC

## 2022-10-05 MED ORDER — OXYCODONE HCL 5 MG PO TABS
5.0000 mg | ORAL_TABLET | Freq: Four times a day (QID) | ORAL | Status: DC | PRN
  Administered 2022-10-06 – 2022-10-10 (×9): 10 mg via ORAL
  Administered 2022-10-17 (×2): 5 mg via ORAL
  Filled 2022-10-05 (×2): qty 1
  Filled 2022-10-05 (×11): qty 2

## 2022-10-05 MED ORDER — FLUTICASONE FUROATE-VILANTEROL 200-25 MCG/ACT IN AEPB
1.0000 | INHALATION_SPRAY | Freq: Every day | RESPIRATORY_TRACT | Status: DC
  Administered 2022-10-06 – 2022-10-10 (×4): 1 via RESPIRATORY_TRACT
  Filled 2022-10-05 (×2): qty 28

## 2022-10-05 MED ORDER — ACYCLOVIR 400 MG PO TABS
400.0000 mg | ORAL_TABLET | Freq: Two times a day (BID) | ORAL | Status: DC
  Administered 2022-10-05 – 2022-10-19 (×28): 400 mg via ORAL
  Filled 2022-10-05 (×32): qty 1

## 2022-10-05 MED ORDER — ALBUTEROL SULFATE (2.5 MG/3ML) 0.083% IN NEBU
5.0000 mg | INHALATION_SOLUTION | Freq: Once | RESPIRATORY_TRACT | Status: AC
  Administered 2022-10-05: 5 mg via RESPIRATORY_TRACT
  Filled 2022-10-05: qty 6

## 2022-10-05 MED ORDER — ONDANSETRON HCL 4 MG PO TABS: 4.0000 mg | ORAL_TABLET | Freq: Four times a day (QID) | ORAL | Status: DC | PRN

## 2022-10-05 MED ORDER — AMLODIPINE BESYLATE 5 MG PO TABS
5.0000 mg | ORAL_TABLET | Freq: Every day | ORAL | Status: DC
  Administered 2022-10-05 – 2022-10-07 (×3): 5 mg via ORAL
  Filled 2022-10-05 (×3): qty 1

## 2022-10-05 MED ORDER — LEVOFLOXACIN IN D5W 250 MG/50ML IV SOLN: 250.0000 mg | INTRAVENOUS | Status: DC

## 2022-10-05 MED ORDER — PANTOPRAZOLE SODIUM 40 MG PO TBEC
40.0000 mg | DELAYED_RELEASE_TABLET | Freq: Every day | ORAL | Status: DC
  Administered 2022-10-06 – 2022-10-19 (×14): 40 mg via ORAL
  Filled 2022-10-05 (×14): qty 1

## 2022-10-05 MED ORDER — METOPROLOL TARTRATE 50 MG PO TABS
50.0000 mg | ORAL_TABLET | Freq: Two times a day (BID) | ORAL | Status: DC
  Administered 2022-10-05 – 2022-10-07 (×5): 50 mg via ORAL
  Filled 2022-10-05 (×6): qty 1

## 2022-10-05 MED ORDER — LIDOCAINE HCL 1 % IJ SOLN
INTRAMUSCULAR | Status: AC
  Filled 2022-10-05: qty 20

## 2022-10-05 MED ORDER — ACETAMINOPHEN 650 MG RE SUPP: 650.0000 mg | Freq: Four times a day (QID) | RECTAL | Status: DC | PRN

## 2022-10-05 MED ORDER — PRAVASTATIN SODIUM 40 MG PO TABS
40.0000 mg | ORAL_TABLET | Freq: Every day | ORAL | Status: DC
  Administered 2022-10-06 – 2022-10-19 (×14): 40 mg via ORAL
  Filled 2022-10-05 (×14): qty 1

## 2022-10-05 NOTE — Consult Note (Signed)
NAME:  Stacey Montoya, MRN:  [id number], DOB:  1975/10/29, LOS: 0 ADMISSION DATE:  10/05/2022, CONSULTATION DATE:  8/19 REFERRING MD:  Kirby Crigler , CHIEF COMPLAINT:  complex right pleural effusion    History of Present Illness:  47 year old female w/ sig h/o RISS stage 3 multiple myeloma s/p high dose chemo and autologous stem cell transplant back in Aug 2022.  Marland Kitchen Last seen by Oncology 7/31. During that visit noted myeloma stable  and she is being managed on Revlimid and zometa EO month. Her disease is felt to be stable w/ the exception of prior hypercalcemia and recent AKI back in May 2024. Presents to ER 8/19 w/ cc right sided pleuritic chest pain, cough productive of thick brown sputum, wheezing and increased shortness of breath. Onset initially noted on 8/15 w/ fever > 101 full body aches and chills. No obvious sick exposures but was in contact w/ young child prior. In ER RVP neg, BNP 112, wbc 6.7. A CT of chest was obtained w/out contrast. This showed loculated right pleural effusion w/ associated RLL and RML airspace disease. Cultures were sent. She was started on empiric antibiotics and referred to interventional radiology where she underwent right sided thoracentesis. The procedure was stopped after only being able top  drain 115 ml of yellow pleural fluid. Review of US showed multiple septations. Initial pleural LDH 401. 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 ABX started. Right sided thoracentesis  exudate removed. Volume limited due to septations and loculation of pleural fluid. PCCM asked to eval   Interim History / Subjective:  Still has some shortness of breath w/exertion. Some wheezing that she felt improved w/ BD neb. Pleuritic CP w/ cough or deep breath   Objective   Blood pressure 111/60, pulse 97, temperature 98.4 F (36.9 C), temperature source Oral, resp. rate (!) 25, height 5' 3.25" (1.607 m), weight 117.9 kg, SpO2 91%.       No intake or output data in the 24 hours ending 10/05/22 1817 Filed Weights   10/05/22 1049  Weight: 117.9 kg    Examination: General: 47 year old WM resting at side of bed.  HENT: NCAT no JVD  Lungs: scattered rhonchi w/ wheezing. PCXR persistent right sided consolidation w/ element of effusion.   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 Hypokalemia  Abnormal LFTs Boarder line Anion gap metabolic acidosis   Pulm problem list Acute hypoxic respiratory failure 2/2 CAP in patient w/ immunosuppression complicated further by complex/ exudative pleural effusion Plan  Hold DOAC (last dose this am) CT placement 8/20 w/ plan to start pleural lytics on 8/21 giving 48 hrs off DOAC to minimize bleed risk IV abx cont doxy and add ctx  Supplemental oxygen   Tobacco abuse w/ wheezing Plan Starting Breo  Nicotine patch Stop vaping    All other issues as mentioned above per IM service   Best Practice (right click and "Reselect all SmartList Selections" daily)   Per primary     Labs   CBC: Recent Labs  Lab 10/05/22 1130  WBC 6.7  NEUTROABS 5.3  HGB 11.6*  HCT 35.2*  MCV 102.0*  PLT 154    Basic Metabolic Panel: Recent Labs  Lab 10/05/22 1130  NA 128*  K 3.2*  CL 96*  CO2 17*  GLUCOSE 120*  BUN 33*  CREATININE 4.24*  CALCIUM 8.8*   GFR: Estimated Creatinine Clearance: 20.9 mL/min (A) (by C-G formula based on SCr of 4.24 mg/dL (H)). Recent Labs  Lab 10/05/22 1130  WBC 6.7    Liver Function Tests: Recent Labs  Lab 10/05/22 1130  AST 78*  ALT 171*  ALKPHOS 135*  BILITOT 1.3*  PROT 7.3  ALBUMIN 3.1*   No results for input(s): "LIPASE", "AMYLASE" in the last 168 hours. No results for input(s): "AMMONIA" in the last 168 hours.  ABG No results found for: "PHART", "PCO2ART", "PO2ART", "HCO3", "TCO2", "ACIDBASEDEF", "O2SAT"   Coagulation Profile: No results for input(s): "INR", "PROTIME" in the last 168 hours.  Cardiac Enzymes: No results for input(s): "CKTOTAL", "CKMB", "CKMBINDEX", "TROPONINI" in the last 168 hours.  HbA1C: No results found for: "HGBA1C"  CBG: No results for input(s): "GLUCAP" in the last 168 hours.  Review of Systems:   Review of Systems  Constitutional:  Positive for chills, fever and malaise/fatigue.  HENT: Negative.    Eyes: Negative.   Respiratory:  Positive for cough, sputum production, shortness of breath and wheezing.   Cardiovascular:  Positive for chest pain.  Gastrointestinal: Negative.   Genitourinary: Negative.   Musculoskeletal:  Positive for myalgias.  Skin: Negative.   Neurological: Negative.   Endo/Heme/Allergies: Negative.   Psychiatric/Behavioral: Negative.       Past Medical History:  She,  has a past medical history of Abnormal Pap smear of cervix, Back pain, Cancer (HCC) (04/02/2020), Hyperlipidemia, and Hypertension.   Surgical History:   Past Surgical History:  Procedure Laterality Date   CERVICAL CONIZATION W/BX  12/02/2010   Procedure: CONIZATION CERVIX WITH  BIOPSY;  Surgeon: Melony Overly;  Location: [facility];  Service: Gynecology;  Laterality: N/A;  COLD KNIFE   CESAREAN SECTION     DILATION AND CURETTAGE OF UTERUS  12/02/2010   Procedure: DILATATION AND CURETTAGE (D&C);  Surgeon: Melony Overly;  Location: [facility];  Service: Gynecology;  Laterality: N/A;   IR RADIOLOGIST EVAL & MGMT  04/16/2020   KYPHOPLASTY Bilateral 09/05/2020   Procedure: KYPHOPLASTY THORACIC TWELVE AND LUMBAR FOUR;  Surgeon: Lisbeth Renshaw, MD;  Location: [facility];  Service: Neurosurgery;  Laterality: Bilateral;   left hand     drain - infection   WISDOM TOOTH EXTRACTION       Social History:   reports that she has quit smoking. Her smoking use included cigarettes. She has a 10 pack-year smoking history. She has never used smokeless tobacco. She reports that she does not currently use alcohol. She reports that she does not currently use drugs.   Family History:  Her family history includes Lung cancer in her  maternal grandfather; Pancreatic cancer in her paternal grandfather.   Allergies Allergies  Allergen Reactions   Duloxetine Hcl Hives   Zithromax [Azithromycin] Rash     Home Medications  Prior to Admission medications   Medication Sig Start Date End Date Taking? Authorizing Provider  acyclovir (ZOVIRAX) 400 MG tablet Take 1 tablet (400 mg total) by mouth 2 (two) times daily. 07/14/22  Yes Johney Maine, MD  albuterol (VENTOLIN HFA) 108 (90 Base) MCG/ACT inhaler Inhale 1-2 puffs into the lungs every 6 (six) hours as needed for wheezing. 02/01/20  Yes [provider]  amLODipine (NORVASC) 5 MG tablet Take 1 tablet (5 mg total) by mouth daily. Patient taking differently: Take 5 mg by mouth at bedtime. 07/10/20 10/05/22 Yes Johney Maine, MD  b complex vitamins capsule Take 1 capsule by mouth daily. 06/10/20  Yes Kale, Corene Cornea, MD  ELIQUIS 5 MG TABS tablet TAKE 1 TABLET BY MOUTH TWICE DAILY. Patient taking differently: Take 5 mg by mouth 2  (two) times daily. 09/22/22  Yes Johney Maine, MD  ergocalciferol (VITAMIN D2) 1.25 MG (50000 UT) capsule Take 1 capsule (50,000 Units total) by mouth 2 (two) times a week. 06/10/20  Yes Johney Maine, MD  furosemide (LASIX) 20 MG tablet TAKE 3 TABLETS BY MOUTH DAILY. Patient taking differently: Take 20 mg by mouth 3 (three) times daily. 09/02/22  Yes Johney Maine, MD  gabapentin (NEURONTIN) 300 MG capsule TAKE 1 CAPSULES IN THE MORNING, TAKE 2 CAPSULES IN THE AFTERNOON, AND TAKE 3 CAPSULES AT BEDTIME Patient taking differently: Take 300 mg by mouth See admin instructions. TAKE 1 CAPSULES IN THE MORNING, TAKE 2 CAPSULES IN THE AFTERNOON, AND TAKE 3 CAPSULES AT BEDTIME 09/16/22  Yes Thayil, Irene T, PA-C  lenalidomide (REVLIMID) 10 MG capsule TAKE 1 CAPSULE BY MOUTH DAILY  FOR 21 DAYS, THEN 7 DAYS OFF Patient taking differently: Take 10 mg by mouth See admin instructions. TAKE 1 CAPSULE BY MOUTH DAILY  FOR 21 DAYS, THEN 7 DAYS OFF 09/22/22  Yes Johney Maine, MD  medroxyPROGESTERone (PROVERA) 10 MG tablet Take 10 mg by mouth daily.   Yes [provider]  metoprolol tartrate (LOPRESSOR) 50 MG tablet Take 1 tablet (50 mg total) by mouth 2 (two) times daily. 07/10/20 10/05/22 Yes Johney Maine, MD  omeprazole (PRILOSEC) 20 MG capsule Take 20 mg by mouth daily. 09/11/22  Yes [provider]  ondansetron (ZOFRAN) 4 MG tablet Take 8 mg by mouth every 8 (eight) hours. 03/19/20  Yes [provider]  oxyCODONE (OXY IR/ROXICODONE) 5 MG immediate release tablet TAKE 1 TABLET EVERY 6 HOURS AS NEEDED FOR PAIN. Patient taking differently: Take 5 mg by mouth every 6 (six) hours as needed for moderate pain. for pain 10/02/22  Yes Johney Maine, MD  polyethylene glycol (MIRALAX / GLYCOLAX) 17 g packet Take 17 g by mouth daily as needed for mild constipation. 09/05/20  Yes Lisbeth Renshaw, MD  potassium chloride SA (KLOR-CON M) 20 MEQ tablet TAKE (1) TABLET BY  MOUTH TWICE DAILY. Patient taking differently: Take 20 mEq by mouth 2 (two) times daily. 09/14/22  Yes Georga Kaufmann T, PA-C  pravastatin (PRAVACHOL) 40 MG tablet Take 40 mg by mouth daily.   Yes [provider]  prochlorperazine (COMPAZINE) 10 MG tablet Take 1 tablet (10 mg total) by mouth every 6 (six) hours as needed for nausea or vomiting. 05/13/22  Yes Briant Cedar, PA-C     Critical  care time: NA    [name] ACNP-BC [facility] Pulmonary/Critical Care Pager # [phone] OR # [phone] if no answer

## 2022-10-05 NOTE — ED Notes (Signed)
Patient off the oxygen on room air dropped down to 83%

## 2022-10-05 NOTE — Procedures (Signed)
Ultrasound-guided diagnostic and therapeutic right thoracentesis performed yielding 115 cc of yellow fluid. No immediate complications. Follow-up chest x-ray pending. The collection was multiloculated and only the above amount of fluid could be aspirated today. The fluid was sent to the lab for preordered studies. EBL none.

## 2022-10-05 NOTE — H&P (Signed)
History and Physical  Stacey Montoya:[id number] DOB: [birth year] DOA: 10/05/2022  PCP: Benita Stabile, MD   Chief Complaint: Dyspnea  HPI: Stacey Montoya is a 47 y.o. female with medical history significant for hypertension, hyperlipidemia, multiple myeloma status post chemotherapy now in remission under the care of Dr. Candise Che, DVT on Eliquis presenting to the hospital with complaints of 3 days of increasing dyspnea found to have a right sided upper lobe pulmonary consolidation and associated moderate pleural effusion.  There is associated cough, no fevers, some right-sided chest pain, no vomiting she has been feeling like a sensation of fluid in her chest recently.  ED Course: In the emergency department, she was initially hypoxic, placed on 3 L nasal cannula oxygen, has been afebrile and slightly hypertensive.  Lab work shows stable anemia, no leukocytosis, sodium 128, potassium 3.2, creatinine 4.24 from baseline about 1.2.  She also has grossly abnormal LFTs, with alk phos 135, AST 78, ALT 171.  Noncontrast CT shows right upper lobe consolidation, moderate pleural effusion.  ER provider ordered ultrasound-guided thoracentesis, and contacted hospitalist for admission.  Review of Systems: Please see HPI for pertinent positives and negatives. A complete 10 system review of systems are otherwise negative.  Past Medical History:  Diagnosis Date   Abnormal Pap smear of cervix    Back pain    Cancer (HCC) 04/02/2020   multiple myeloma   Hyperlipidemia    Hypertension    Past Surgical History:  Procedure Laterality Date   CERVICAL CONIZATION W/BX  12/02/2010   Procedure: CONIZATION CERVIX WITH BIOPSY;  Surgeon: Melony Overly;  Location: [facility];  Service: Gynecology;  Laterality: N/A;  COLD KNIFE   CESAREAN SECTION     DILATION AND CURETTAGE OF UTERUS  12/02/2010   Procedure: DILATATION AND CURETTAGE (D&C);  Surgeon: Melony Overly;  Location: [facility];  Service: Gynecology;  Laterality:  N/A;   IR RADIOLOGIST EVAL & MGMT  04/16/2020   KYPHOPLASTY Bilateral 09/05/2020   Procedure: KYPHOPLASTY THORACIC TWELVE AND LUMBAR FOUR;  Surgeon: Lisbeth Renshaw, MD;  Location: [facility];  Service: Neurosurgery;  Laterality: Bilateral;   left hand     drain - infection   WISDOM TOOTH EXTRACTION      Social History:  reports that she has quit smoking. Her smoking use included cigarettes. She has a 10 pack-year smoking history. She has never used smokeless tobacco. She reports that she does not currently use alcohol. She reports that she does not currently use drugs.   Allergies  Allergen Reactions   Duloxetine Hcl Hives   Zithromax [Azithromycin] Rash    Family History  Problem Relation Age of Onset   Lung cancer Maternal Grandfather    Pancreatic cancer Paternal Grandfather      Prior to Admission medications   Medication Sig Start Date End Date Taking? Authorizing Provider  acyclovir (ZOVIRAX) 400 MG tablet Take 1 tablet (400 mg total) by mouth 2 (two) times daily. 07/14/22  Yes Johney Maine, MD  albuterol (VENTOLIN HFA) 108 (90 Base) MCG/ACT inhaler Inhale 1-2 puffs into the lungs every 6 (six) hours as needed for wheezing. 02/01/20  Yes [provider]  amLODipine (NORVASC) 5 MG tablet Take 1 tablet (5 mg total) by mouth daily. Patient taking differently: Take 5 mg by mouth at bedtime. 07/10/20 10/05/22 Yes Johney Maine, MD  b complex vitamins capsule Take 1 capsule by mouth daily. 06/10/20  Yes Johney Maine, MD  ELIQUIS 5 MG TABS  tablet TAKE 1 TABLET BY MOUTH TWICE DAILY. Patient taking differently: Take 5 mg by mouth 2 (two) times daily. 09/22/22  Yes Johney Maine, MD  ergocalciferol (VITAMIN D2) 1.25 MG (50000 UT) capsule Take 1 capsule (50,000 Units total) by mouth 2 (two) times a week. 06/10/20  Yes Johney Maine, MD  furosemide (LASIX) 20 MG tablet TAKE 3 TABLETS BY MOUTH DAILY. Patient taking differently: Take 20 mg by mouth 3  (three) times daily. 09/02/22  Yes Johney Maine, MD  gabapentin (NEURONTIN) 300 MG capsule TAKE 1 CAPSULES IN THE MORNING, TAKE 2 CAPSULES IN THE AFTERNOON, AND TAKE 3 CAPSULES AT BEDTIME Patient taking differently: Take 300 mg by mouth See admin instructions. TAKE 1 CAPSULES IN THE MORNING, TAKE 2 CAPSULES IN THE AFTERNOON, AND TAKE 3 CAPSULES AT BEDTIME 09/16/22  Yes Thayil, Irene T, PA-C  lenalidomide (REVLIMID) 10 MG capsule TAKE 1 CAPSULE BY MOUTH DAILY  FOR 21 DAYS, THEN 7 DAYS OFF Patient taking differently: Take 10 mg by mouth See admin instructions. TAKE 1 CAPSULE BY MOUTH DAILY  FOR 21 DAYS, THEN 7 DAYS OFF 09/22/22  Yes Johney Maine, MD  medroxyPROGESTERone (PROVERA) 10 MG tablet Take 10 mg by mouth daily.   Yes [provider]  metoprolol tartrate (LOPRESSOR) 50 MG tablet Take 1 tablet (50 mg total) by mouth 2 (two) times daily. 07/10/20 10/05/22 Yes Johney Maine, MD  omeprazole (PRILOSEC) 20 MG capsule Take 20 mg by mouth daily. 09/11/22  Yes [provider]  ondansetron (ZOFRAN) 4 MG tablet Take 8 mg by mouth every 8 (eight) hours. 03/19/20  Yes [provider]  oxyCODONE (OXY IR/ROXICODONE) 5 MG immediate release tablet TAKE 1 TABLET EVERY 6 HOURS AS NEEDED FOR PAIN. Patient taking differently: Take 5 mg by mouth every 6 (six) hours as needed for moderate pain. for pain 10/02/22  Yes Johney Maine, MD  polyethylene glycol (MIRALAX / GLYCOLAX) 17 g packet Take 17 g by mouth daily as needed for mild constipation. 09/05/20  Yes Lisbeth Renshaw, MD  potassium chloride SA (KLOR-CON M) 20 MEQ tablet TAKE (1) TABLET BY MOUTH TWICE DAILY. Patient taking differently: Take 20 mEq by mouth 2 (two) times daily. 09/14/22  Yes Georga Kaufmann T, PA-C  pravastatin (PRAVACHOL) 40 MG tablet Take 40 mg by mouth daily.   Yes [provider]  prochlorperazine (COMPAZINE) 10 MG tablet Take 1 tablet (10 mg total) by mouth every 6 (six) hours as needed  for nausea or vomiting. 05/13/22  Yes Briant Cedar, PA-C    Physical Exam: BP (!) 149/81   Pulse 97   Temp 98.4 F (36.9 C) (Oral)   Resp (!) 25   Ht 5' 3.25" (1.607 m)   Wt 117.9 kg   SpO2 91%   BMI 45.69 kg/m   General:  Alert, oriented, calm, in no acute distress, wearing 3 L nasal cannula oxygen, seen on the fourth floor, with her husband at the bedside. Eyes: EOMI, clear conjuctivae, white sclerea Neck: supple, no masses, trachea mildline  Cardiovascular: RRR, no murmurs or rubs, no peripheral edema  Respiratory: clear to auscultation bilaterally but very diminished at the right base, no wheezes, no crackles, some diffuse rhonchi Abdomen: soft, nontender, nondistended, normal bowel tones heard  Skin: dry, no rashes  Musculoskeletal: no joint effusions, normal range of motion  Psychiatric: appropriate affect, normal speech  Neurologic: extraocular muscles intact, clear speech, moving all extremities with intact sensorium  Labs on Admission:  Basic Metabolic Panel: Recent Labs  Lab 10/05/22 1130  NA 128*  K 3.2*  CL 96*  CO2 17*  GLUCOSE 120*  BUN 33*  CREATININE 4.24*  CALCIUM 8.8*   Liver Function Tests: Recent Labs  Lab 10/05/22 1130  AST 78*  ALT 171*  ALKPHOS 135*  BILITOT 1.3*  PROT 7.3  ALBUMIN 3.1*   No results for input(s): "LIPASE", "AMYLASE" in the last 168 hours. No results for input(s): "AMMONIA" in the last 168 hours. CBC: Recent Labs  Lab 10/05/22 1130  WBC 6.7  NEUTROABS 5.3  HGB 11.6*  HCT 35.2*  MCV 102.0*  PLT 154   Cardiac Enzymes: No results for input(s): "CKTOTAL", "CKMB", "CKMBINDEX", "TROPONINI" in the last 168 hours.  BNP (last 3 results) Recent Labs    10/05/22 1130  BNP 112.3*    ProBNP (last 3 results) No results for input(s): "PROBNP" in the last 8760 hours.  CBG: No results for input(s): "GLUCAP" in the last 168 hours.  Radiological Exams on Admission: CT Chest Wo Contrast  Result Date:  10/05/2022 CLINICAL DATA:  Pleural effusion. Shortness of breath. Malignancy suspected. History of multiple myeloma. EXAM: CT CHEST WITHOUT CONTRAST TECHNIQUE: Multidetector CT imaging of the chest was performed following the standard protocol without IV contrast. RADIATION DOSE REDUCTION: This exam was performed according to the departmental dose-optimization program which includes automated exposure control, adjustment of the mA and/or kV according to patient size and/or use of iterative reconstruction technique. COMPARISON:  Chest x-ray same day. CT chest abdomen and pelvis 04/02/2020. FINDINGS: Cardiovascular: No significant vascular findings. Normal heart size. No pericardial effusion. Mediastinum/Nodes: There are enlarged paratracheal lymph nodes measuring up to 15 mm short axis. Enlarged right hilar lymph node measures up to 11 mm short axis. Visualized esophagus and thyroid gland are within normal limits. Lungs/Pleura: There is a moderate-sized loculated right pleural effusion. There is underlying atelectasis/compression of the right lower lobe and right middle lobe. There is also some patchy and strandy opacities in the right lung apex. Left lung appears clear. Upper Abdomen: No acute abnormality. Musculoskeletal: Numerous lytic lesions are seen throughout the osseous structures. These have mildly increased in size and number when compared to the prior study. Mild compression deformities of the superior endplate of T7 and T8 appear new from prior. No retropulsion of fracture fragments. IMPRESSION: 1. Moderate-sized loculated right pleural effusion 2. Atelectasis/compression of the right lower lobe and right middle lobe. Underlying pneumonia or neoplasm not excluded. 3. Patchy and strandy opacities in the right lung apex worrisome for infection. 4. Mediastinal and right hilar lymphadenopathy. 5. Diffuse lytic lesions throughout the osseous structures compatible with patient's history of multiple myeloma.  These have mildly increased in size and number when compared to the prior study. 6. Mild compression deformities of the superior endplates of T7 and T8 appear new from prior. Electronically Signed   By: Darliss Cheney M.D.   On: 10/05/2022 16:00   DG Chest 2 View  Result Date: 10/05/2022 CLINICAL DATA:  Shortness of breath. EXAM: CHEST - 2 VIEW COMPARISON:  Chest radiograph 04/04/2020.  Chest CT 04/02/2020 FINDINGS: Moderate to large right pleural effusion, occupying the lower 2/3 of right hemithorax. No definite leftward mediastinal shift suggesting underlying compressive atelectasis in the right lung. There are streaky right suprahilar opacities. The heart is upper normal in size, although primarily obscured. No focal airspace disease in the left lung. There are bilateral lytic rib lesions. Many of the additional bone  lesions on CT are not well-defined by radiograph. IMPRESSION: 1. Moderate to large right pleural effusion, occupying the lower 2/3 of the right hemithorax. Streaky right suprahilar opacities, favor. 2. Bilateral lytic rib lesions consistent with known multiple myeloma. Electronically Signed   By: Narda Rutherford M.D.   On: 10/05/2022 13:43    Assessment/Plan Stacey Montoya is a 47 y.o. female with medical history significant for hypertension, hyperlipidemia, multiple myeloma status postchemotherapy now in remission under the care of Dr. Meda Coffee presenting to the hospital with complaints of 3 days of increasing dyspnea found to have a right sided upper lobe pulmonary consolidation and associated moderate pleural effusion.  She underwent ultrasound-guided thoracentesis, yielding only 115 cc of yellow fluid.  Sent for analysis.  Moderate sized right sided loculated pleural effusion-unknown etiology, may be parapneumonic, versus potentially malignant.  There is associated consolidation.  Given fever sounds like this may be from an infectious process.  Less likely heart failure, note BNP  112. -Inpatient admission -Supplemental oxygen -Empiric IV doxycycline (azithromycin allergy) -Consulted pulmonary Dr. Rennis Harding for further management, potential chest tube.  Discussed with Anders Simmonds, pulmonary NP they plan for chest tube with possible lytics in the morning, as she took her Eliquis this morning. -Follow-up pleural fluid studies  Acute hypoxic respiratory failure-due to pleural effusion, managing as above  Hypokalemia-possibly due to continued Lasix -Replete potassium -Recheck with morning labs  Hypertension-continue amlodipine, Lopressor  AKI-baseline creatinine about 1.2 -Hold nephrotoxins including her home Lasix -Hydrate with IV fluids -Follow renal function with daily labs  Hyponatremia-mild, asymptomatic, possibly SIADH due to pulmonary process or potentially due to relative dehydration -Normal saline infusion, recheck sodium in the morning  History of multiple myeloma-currently on Revlimid. -Dr. Candise Che added to treatment team  Abnormal LFT-no abdominal symptoms, nausea, pain, etc. -Avoid hepatotoxins -Check acute hepatitis panel -Check right upper quadrant ultrasound  History of DVT in 2022-has remained on Eliquis due to Revlimid  DVT prophylaxis: SCDs only pending possible chest tube    Code Status: Full Code  Consults called: Pulmonary Dr. Rennis Harding  Admission status: The appropriate patient status for this patient is INPATIENT. Inpatient status is judged to be reasonable and necessary in order to provide the required intensity of service to ensure the patient's safety. The patient's presenting symptoms, physical exam findings, and initial radiographic and laboratory data in the context of their chronic comorbidities is felt to place them at high risk for further clinical deterioration. Furthermore, it is not anticipated that the patient will be medically stable for discharge from the hospital within 2 midnights of admission.    I certify that at the point  of admission it is my clinical judgment that the patient will require inpatient hospital care spanning beyond 2 midnights from the point of admission due to high intensity of service, high risk for further deterioration and high frequency of surveillance required  Time spent: 56 minutes  Jayvian Escoe Sharlette Dense MD [facility] Pager [phone]  If 7PM-7AM, please contact night-coverage [email or web address] Password [facility]  10/05/2022, 4:42 PM

## 2022-10-05 NOTE — ED Provider Notes (Signed)
EMERGENCY DEPARTMENT AT [facility] Provider Note   CSN: [id number] Arrival date & time: 10/05/22  1042     History  Chief Complaint  Patient presents with   Shortness of Breath    SHELLENA SCHALL is a 47 y.o. female.  HPI Presents with dyspnea.  She is notable for multiple myeloma, now in remission.  Patient does not wear oxygen.  Over the past 3 days she has become increasingly dyspneic, without focal pain.  She notes some sensation of fluid retention on the right side of her thorax as well as in her abdomen.  There is associated cough.  No fever, no vomiting.    Home Medications Prior to Admission medications   Medication Sig Start Date End Date Taking? Authorizing Provider  acyclovir (ZOVIRAX) 400 MG tablet Take 1 tablet (400 mg total) by mouth 2 (two) times daily. 07/14/22  Yes Johney Maine, MD  albuterol (VENTOLIN HFA) 108 (90 Base) MCG/ACT inhaler Inhale 1-2 puffs into the lungs every 6 (six) hours as needed for wheezing. 02/01/20  Yes [provider]  amLODipine (NORVASC) 5 MG tablet Take 1 tablet (5 mg total) by mouth daily. Patient taking differently: Take 5 mg by mouth at bedtime. 07/10/20 10/05/22 Yes Johney Maine, MD  b complex vitamins capsule Take 1 capsule by mouth daily. 06/10/20  Yes Kale, Corene Cornea, MD  ELIQUIS 5 MG TABS tablet TAKE 1 TABLET BY MOUTH TWICE DAILY. Patient taking differently: Take 5 mg by mouth 2 (two) times daily. 09/22/22  Yes Johney Maine, MD  ergocalciferol (VITAMIN D2) 1.25 MG (50000 UT) capsule Take 1 capsule (50,000 Units total) by mouth 2 (two) times a week. 06/10/20  Yes Johney Maine, MD  furosemide (LASIX) 20 MG tablet TAKE 3 TABLETS BY MOUTH DAILY. Patient taking differently: Take 20 mg by mouth 3 (three) times daily. 09/02/22  Yes Johney Maine, MD  gabapentin (NEURONTIN) 300 MG capsule TAKE 1 CAPSULES IN THE MORNING, TAKE 2 CAPSULES IN THE AFTERNOON, AND TAKE 3  CAPSULES AT BEDTIME Patient taking differently: Take 300 mg by mouth See admin instructions. TAKE 1 CAPSULES IN THE MORNING, TAKE 2 CAPSULES IN THE AFTERNOON, AND TAKE 3 CAPSULES AT BEDTIME 09/16/22  Yes Thayil, Irene T, PA-C  lenalidomide (REVLIMID) 10 MG capsule TAKE 1 CAPSULE BY MOUTH DAILY  FOR 21 DAYS, THEN 7 DAYS OFF Patient taking differently: Take 10 mg by mouth See admin instructions. TAKE 1 CAPSULE BY MOUTH DAILY  FOR 21 DAYS, THEN 7 DAYS OFF 09/22/22  Yes Johney Maine, MD  medroxyPROGESTERone (PROVERA) 10 MG tablet Take 10 mg by mouth daily.   Yes [provider]  metoprolol tartrate (LOPRESSOR) 50 MG tablet Take 1 tablet (50 mg total) by mouth 2 (two) times daily. 07/10/20 10/05/22 Yes Johney Maine, MD  omeprazole (PRILOSEC) 20 MG capsule Take 20 mg by mouth daily. 09/11/22  Yes [provider]  ondansetron (ZOFRAN) 4 MG tablet Take 8 mg by mouth every 8 (eight) hours. 03/19/20  Yes [provider]  oxyCODONE (OXY IR/ROXICODONE) 5 MG immediate release tablet TAKE 1 TABLET EVERY 6 HOURS AS NEEDED FOR PAIN. Patient taking differently: Take 5 mg by mouth every 6 (six) hours as needed for moderate pain. for pain 10/02/22  Yes Johney Maine, MD  polyethylene glycol (MIRALAX / GLYCOLAX) 17 g packet Take 17 g by mouth daily as needed for mild constipation. 09/05/20  Yes Lisbeth Renshaw, MD  potassium  chloride SA (KLOR-CON M) 20 MEQ tablet TAKE (1) TABLET BY MOUTH TWICE DAILY. Patient taking differently: Take 20 mEq by mouth 2 (two) times daily. 09/14/22  Yes Georga Kaufmann T, PA-C  pravastatin (PRAVACHOL) 40 MG tablet Take 40 mg by mouth daily.   Yes [provider]  prochlorperazine (COMPAZINE) 10 MG tablet Take 1 tablet (10 mg total) by mouth every 6 (six) hours as needed for nausea or vomiting. 05/13/22  Yes Georga Kaufmann T, PA-C      Allergies    Duloxetine hcl and Zithromax [azithromycin]    Review of Systems   Review of Systems  All  other systems reviewed and are negative.   Physical Exam Updated Vital Signs BP (!) 122/58   Pulse 93   Temp 98.4 F (36.9 C) (Oral)   Resp 19   Ht 5' 3.25" (1.607 m)   Wt 117.9 kg   SpO2 91%   BMI 45.69 kg/m  Physical Exam Vitals and nursing note reviewed.  Constitutional:      General: She is not in acute distress.    Appearance: She is well-developed. She is obese. She is ill-appearing.  HENT:     Head: Normocephalic and atraumatic.  Eyes:     Conjunctiva/sclera: Conjunctivae normal.  Cardiovascular:     Rate and Rhythm: Regular rhythm. Tachycardia present.  Pulmonary:     Effort: Pulmonary effort is normal. Tachypnea present.     Breath sounds: Decreased breath sounds present.  Abdominal:     General: There is no distension.  Skin:    General: Skin is warm and dry.  Neurological:     Mental Status: She is alert and oriented to person, place, and time.     Cranial Nerves: No cranial nerve deficit.  Psychiatric:        Mood and Affect: Mood normal.     ED Results / Procedures / Treatments   Labs (all labs ordered are listed, but only abnormal results are displayed) Labs Reviewed  COMPREHENSIVE METABOLIC PANEL - Abnormal; Notable for the following components:      Result Value   Sodium 128 (*)    Potassium 3.2 (*)    Chloride 96 (*)    CO2 17 (*)    Glucose, Bld 120 (*)    BUN 33 (*)    Creatinine, Ser 4.24 (*)    Calcium 8.8 (*)    Albumin 3.1 (*)    AST 78 (*)    ALT 171 (*)    Alkaline Phosphatase 135 (*)    Total Bilirubin 1.3 (*)    GFR, Estimated 13 (*)    All other components within normal limits  BRAIN NATRIURETIC PEPTIDE - Abnormal; Notable for the following components:   B Natriuretic Peptide 112.3 (*)    All other components within normal limits  CBC WITH DIFFERENTIAL/PLATELET - Abnormal; Notable for the following components:   RBC 3.45 (*)    Hemoglobin 11.6 (*)    HCT 35.2 (*)    MCV 102.0 (*)    RDW 16.1 (*)    Lymphs Abs 0.6 (*)     Abs Immature Granulocytes 0.08 (*)    All other components within normal limits  RESP PANEL BY RT-PCR (RSV, FLU A&B, COVID)  RVPGX2  HCG, SERUM, QUALITATIVE    EKG EKG Interpretation Date/Time:  Monday October 05 2022 10:55:17 EDT Ventricular Rate:  90 PR Interval:  179 QRS Duration:  90 QT Interval:  383 QTC Calculation: 469 R Axis:  38  Text Interpretation: Sinus rhythm Abnormal R-wave progression, early transition Confirmed by Gerhard Munch [phone]) on 10/05/2022 11:06:37 AM  Radiology DG Chest 2 View  Result Date: 10/05/2022 CLINICAL DATA:  Shortness of breath. EXAM: CHEST - 2 VIEW COMPARISON:  Chest radiograph 04/04/2020.  Chest CT 04/02/2020 FINDINGS: Moderate to large right pleural effusion, occupying the lower 2/3 of right hemithorax. No definite leftward mediastinal shift suggesting underlying compressive atelectasis in the right lung. There are streaky right suprahilar opacities. The heart is upper normal in size, although primarily obscured. No focal airspace disease in the left lung. There are bilateral lytic rib lesions. Many of the additional bone lesions on CT are not well-defined by radiograph. IMPRESSION: 1. Moderate to large right pleural effusion, occupying the lower 2/3 of the right hemithorax. Streaky right suprahilar opacities, favor. 2. Bilateral lytic rib lesions consistent with known multiple myeloma. Electronically Signed   By: Narda Rutherford M.D.   On: 10/05/2022 13:43    Procedures Procedures    Medications Ordered in ED Medications  albuterol (PROVENTIL) (2.5 MG/3ML) 0.083% nebulizer solution 5 mg (5 mg Nebulization Given 10/05/22 1127)    ED Course/ Medical Decision Making/ A&P                                 Medical Decision Making Patient presents with dyspnea, in the context of known multiple myeloma though in remission. Patient is awake, alert, afebrile, mentating appropriately.  Suspicion for recurrent disease versus pneumonia versus PE.   Cardiac 95 sinus normal Pulse ox 89% room air abnormal   Amount and/or Complexity of Data Reviewed Independent Historian: spouse External Data Reviewed: notes.    Details: Oncology notes reviewed Labs: ordered. Decision-making details documented in ED Course. Radiology: ordered and independent interpretation performed. Decision-making details documented in ED Course. ECG/medicine tests: ordered and independent interpretation performed. Decision-making details documented in ED Course.  Risk Prescription drug management. Decision regarding hospitalization.  Exam the patient is coming by her husband.  He corroborates history.  We reviewed the x-ray at bedside concerning for pleural effusion, and with new oxygen requirement, as well as evidence for acute kidney injury patient will require admission.  Patient's BNP is generally unremarkable, CT scan has been ordered. Patient is receiving fluid resuscitation, but heart rate has been remained below 100. In regards to consideration of PE, the patient is not a candidate for CT angiography, as she has acute kidney injury. With consideration of pleural effusion, malignancy, CT chest has been ordered, he is pending interpretation on admission.  Ultrasound thoracentesis has been ordered as well.  Final Clinical Impression(s) / ED Diagnoses Final diagnoses:  Hypoxia  Pleural effusion  AKI (acute kidney injury) (HCC)   CRITICAL CARE Performed by: Gerhard Munch Total critical care time: 35 minutes Critical care time was exclusive of separately billable procedures and treating other patients. Critical care was necessary to treat or prevent imminent or life-threatening deterioration. Critical care was time spent personally by me on the following activities: development of treatment plan with patient and/or surrogate as well as nursing, discussions with consultants, evaluation of patient's response to treatment, examination of patient, obtaining  history from patient or surrogate, ordering and performing treatments and interventions, ordering and review of laboratory studies, ordering and review of radiographic studies, pulse oximetry and re-evaluation of patient's condition.    Gerhard Munch, MD 10/05/22 [id number]

## 2022-10-05 NOTE — ED Triage Notes (Addendum)
Patient has been feeling short of breath for 3 days. Worsens with exertion. Hurts to take a deep breath. Wheezing. Feels like fluid is on her right lung and in her belly. Coughing up brown mucus.

## 2022-10-06 ENCOUNTER — Inpatient Hospital Stay (HOSPITAL_COMMUNITY): Payer: 59

## 2022-10-06 ENCOUNTER — Encounter: Payer: Self-pay | Admitting: Hematology

## 2022-10-06 ENCOUNTER — Other Ambulatory Visit: Payer: Self-pay | Admitting: Hematology

## 2022-10-06 ENCOUNTER — Encounter (HOSPITAL_COMMUNITY): Payer: Self-pay | Admitting: Internal Medicine

## 2022-10-06 DIAGNOSIS — J90 Pleural effusion, not elsewhere classified: Secondary | ICD-10-CM | POA: Diagnosis not present

## 2022-10-06 LAB — URINALYSIS, ROUTINE W REFLEX MICROSCOPIC
Bilirubin Urine: NEGATIVE
Glucose, UA: NEGATIVE mg/dL
Ketones, ur: NEGATIVE mg/dL
Leukocytes,Ua: NEGATIVE
Nitrite: NEGATIVE
Protein, ur: 30 mg/dL — AB
Specific Gravity, Urine: 1.004 — ABNORMAL LOW (ref 1.005–1.030)
pH: 6 (ref 5.0–8.0)

## 2022-10-06 LAB — CBC
HCT: 34.1 % — ABNORMAL LOW (ref 36.0–46.0)
Hemoglobin: 11 g/dL — ABNORMAL LOW (ref 12.0–15.0)
MCH: 33.2 pg (ref 26.0–34.0)
MCHC: 32.3 g/dL (ref 30.0–36.0)
MCV: 103 fL — ABNORMAL HIGH (ref 80.0–100.0)
Platelets: 147 10*3/uL — ABNORMAL LOW (ref 150–400)
RBC: 3.31 MIL/uL — ABNORMAL LOW (ref 3.87–5.11)
RDW: 16.2 % — ABNORMAL HIGH (ref 11.5–15.5)
WBC: 6.9 10*3/uL (ref 4.0–10.5)
nRBC: 0 % (ref 0.0–0.2)

## 2022-10-06 LAB — CREATININE, URINE, RANDOM: Creatinine, Urine: 25 mg/dL

## 2022-10-06 LAB — EXPECTORATED SPUTUM ASSESSMENT W GRAM STAIN, RFLX TO RESP C

## 2022-10-06 LAB — HEPATITIS PANEL, ACUTE
HCV Ab: NONREACTIVE
Hep A IgM: NONREACTIVE
Hep B C IgM: NONREACTIVE
Hepatitis B Surface Ag: NONREACTIVE

## 2022-10-06 LAB — BASIC METABOLIC PANEL
Anion gap: 16 — ABNORMAL HIGH (ref 5–15)
BUN: 39 mg/dL — ABNORMAL HIGH (ref 6–20)
CO2: 15 mmol/L — ABNORMAL LOW (ref 22–32)
Calcium: 8.7 mg/dL — ABNORMAL LOW (ref 8.9–10.3)
Chloride: 98 mmol/L (ref 98–111)
Creatinine, Ser: 4.75 mg/dL — ABNORMAL HIGH (ref 0.44–1.00)
GFR, Estimated: 11 mL/min — ABNORMAL LOW (ref >60)
Glucose, Bld: 124 mg/dL — ABNORMAL HIGH (ref 70–99)
Potassium: 3.1 mmol/L — ABNORMAL LOW (ref 3.5–5.1)
Sodium: 129 mmol/L — ABNORMAL LOW (ref 135–145)

## 2022-10-06 LAB — HIV ANTIBODY (ROUTINE TESTING W REFLEX): HIV Screen 4th Generation wRfx: NONREACTIVE

## 2022-10-06 LAB — SODIUM, URINE, RANDOM: Sodium, Ur: 20 mmol/L

## 2022-10-06 LAB — STREP PNEUMONIAE URINARY ANTIGEN: Strep Pneumo Urinary Antigen: NEGATIVE

## 2022-10-06 MED ORDER — STERILE WATER FOR INJECTION IV SOLN
INTRAVENOUS | Status: DC
  Filled 2022-10-06 (×2): qty 150
  Filled 2022-10-06: qty 1000
  Filled 2022-10-06: qty 150
  Filled 2022-10-06: qty 1000

## 2022-10-06 MED ORDER — FENTANYL CITRATE PF 50 MCG/ML IJ SOSY
100.0000 ug | PREFILLED_SYRINGE | Freq: Once | INTRAMUSCULAR | Status: AC
  Administered 2022-10-06: 100 ug via INTRAVENOUS
  Filled 2022-10-06: qty 2

## 2022-10-06 MED ORDER — SODIUM CHLORIDE 0.9% FLUSH
10.0000 mL | Freq: Three times a day (TID) | INTRAVENOUS | Status: DC
  Administered 2022-10-06 – 2022-10-07 (×3): 10 mL via INTRAPLEURAL

## 2022-10-06 MED ORDER — MIDAZOLAM HCL 2 MG/2ML IJ SOLN
2.0000 mg | Freq: Once | INTRAMUSCULAR | Status: AC
  Administered 2022-10-06: 0.5 mg via INTRAVENOUS
  Filled 2022-10-06: qty 2

## 2022-10-06 MED ORDER — SODIUM CHLORIDE 0.9 % IV BOLUS
3000.0000 mL | Freq: Once | INTRAVENOUS | Status: AC
  Administered 2022-10-06: 3000 mL via INTRAVENOUS

## 2022-10-06 MED ORDER — GABAPENTIN 100 MG PO CAPS
100.0000 mg | ORAL_CAPSULE | Freq: Three times a day (TID) | ORAL | Status: DC
  Administered 2022-10-06 – 2022-10-19 (×39): 100 mg via ORAL
  Filled 2022-10-06 (×39): qty 1

## 2022-10-06 NOTE — Procedures (Signed)
Insertion of Chest Tube Procedure Note  MADELEN Montoya  [id number]  13-Mar-1975  Date:10/06/22  Time:10:48 AM    Provider Performing: Shelby Mattocks   Procedure: Chest Tube Insertion [phone])  Indication(s) Effusion  Consent Risks of the procedure as well as the alternatives and risks of each were explained to the patient and/or caregiver.  Consent for the procedure was obtained and is signed in the bedside chart  Anesthesia Topical only with 1% lidocaine    Time Out Verified patient identification, verified procedure, site/side was marked, verified correct patient position, special equipment/implants available, medications/allergies/relevant history reviewed, required imaging and test results available.   Sterile Technique Maximal sterile technique including full sterile barrier drape, hand hygiene, sterile gown, sterile gloves, mask, hair covering, sterile ultrasound probe cover (if used).   Procedure Description Ultrasound used to identify appropriate pleural anatomy for placement and overlying skin marked. Area of placement cleaned and draped in sterile fashion.  A 14 French pigtail pleural catheter was placed into the right pleural space using Seldinger technique. Appropriate return of fluid was obtained.  The tube was connected to atrium and placed on -20 cm H2O wall suction.   Complications/Tolerance None; patient tolerated the procedure well. Chest X-ray is ordered to verify placement.   EBL Minimal  Specimen(s) none  [name] ACNP-BC [facility] Pulmonary/Critical Care Pager # [phone] OR # [phone] if no answer

## 2022-10-06 NOTE — Progress Notes (Signed)
PROGRESS NOTE    Stacey Montoya  GHW:[id number] DOB: 1976-02-10 DOA: 10/05/2022 PCP: Benita Stabile, MD    Brief Narrative:  47 year old with history of hypertension, hyperlipidemia, multiple myeloma status postchemotherapy now in remission, DVT on Eliquis presented to the emergency room with 3 days of worsening shortness of breath and she was found to have right-sided upper lobe pulmonary consolidation, loculated pleural effusion.  Also had cough but no fever.  Right-sided chest pressure sensation.  In the emergency room initially hypoxic and placed on 3 L oxygen.  Afebrile.  Blood pressure is stable.  Sodium 128, potassium 3.2, creatinine 4.24 from baseline about 1.2.  Mildly elevated transaminases.  IR attempted thoracentesis, only removed about 100 mL.  Pulmonary consulted for chest tube placement and lytics.   Assessment & Plan:   Right-sided loculated pleural effusion, parapneumonic versus malignant effusion.  Also with associated consolidation. Acute hypoxemia, currently needing 3 L of oxygen.  No respiratory distress. Continuing on IV rocephin and IV doxycycline. IR attempted aspiration, diagnostic aspiration done.  Cultures are pending.  Likely transudate.  Cytology pending. Neutrophils 94.  Pulmonary consulted, small bore chest tube placed.  Irrigation, Lytics as per pulmonary. Continue with chest physiotherapy, incentive spirometry, deep breathing exercises, sputum induction, mucolytic's and bronchodilators. Sputum cultures, blood cultures. Supplemental oxygen to keep saturations more than 90%.  Acute kidney injury: Recent baseline creatinine of 1.2.  Presented with creatinine more than 4.  Will check renal ultrasound.  Check urinalysis.   Likely prerenal with ongoing diarrhea and use of Lasix.  Discussed case with nephrology for consultation due to significant jump in creatinine.  Hypokalemia: Replace and monitor.  Multiple myeloma, currently on Revlimid.  Abnormal LFTs:  Acute hepatitis panel pending.  Right upper quadrant ultrasound shows hepatic steatosis.  History of DVT: Remains on Eliquis.  On hold for procedure today.  Will resume after thoracentesis.   DVT prophylaxis: SCDs Start: 10/05/22 1639   Code Status: Full code Family Communication: None at the bedside Disposition Plan: Status is: Inpatient Remains inpatient appropriate because: IV antibiotics, inpatient procedures     Consultants:  Pulmonary Nephrology  Procedures:  Chest tube placement  Antimicrobials:  Doxycycline and Rocephin 8/19--   Subjective: Patient seen in the morning rounds.  She does have some pleuritic chest pain.  She was ready to undergo chest tube placement.  Later in the afternoon patient underwent chest tube placement with local anesthesia yielding 90 cc of transudate.  Patient does complain of ongoing diarrhea for last 5 days.  Urinating normally.  Afebrile.  Anxious about everything going on.  Objective: Vitals:   10/06/22 1000 10/06/22 1018 10/06/22 1300 10/06/22 1313  BP: (!) 145/75 135/84 120/88   Pulse: 99 100 88   Resp:      Temp:   98.5 F (36.9 C)   TempSrc:   Oral   SpO2: 93% 91% (!) 89% 94%  Weight:      Height:        Intake/Output Summary (Last 24 hours) at 10/06/2022 1509 Last data filed at 10/06/2022 1100 Gross per 24 hour  Intake 416.82 ml  Output 1700 ml  Net -1283.18 ml   Filed Weights   10/05/22 1049  Weight: 117.9 kg    Examination:  General exam: Appears anxious.  Looks comfortable at rest.  On minimum oxygen. Respiratory system: Mostly conducted upper airway sounds.  On 2 to 3 L of oxygen.  Poor inspiratory effort. Cardiovascular system: S1 & S2 heard, RRR.  Gastrointestinal  system: Abdomen is nondistended, soft and nontender. No organomegaly or masses felt. Normal bowel sounds heard. Central nervous system: Alert and oriented. No focal neurological deficits. Extremities: Symmetric 5 x 5 power.    Data Reviewed: I  have personally reviewed following labs and imaging studies  CBC: Recent Labs  Lab 10/05/22 1130 10/06/22 0437  WBC 6.7 6.9  NEUTROABS 5.3  --   HGB 11.6* 11.0*  HCT 35.2* 34.1*  MCV 102.0* 103.0*  PLT 154 147*   Basic Metabolic Panel: Recent Labs  Lab 10/05/22 1130 10/06/22 0437  NA 128* 129*  K 3.2* 3.1*  CL 96* 98  CO2 17* 15*  GLUCOSE 120* 124*  BUN 33* 39*  CREATININE 4.24* 4.75*  CALCIUM 8.8* 8.7*   GFR: Estimated Creatinine Clearance: 18.7 mL/min (A) (by C-G formula based on SCr of 4.75 mg/dL (H)). Liver Function Tests: Recent Labs  Lab 10/05/22 1130  AST 78*  ALT 171*  ALKPHOS 135*  BILITOT 1.3*  PROT 7.3  ALBUMIN 3.1*   No results for input(s): "LIPASE", "AMYLASE" in the last 168 hours. No results for input(s): "AMMONIA" in the last 168 hours. Coagulation Profile: No results for input(s): "INR", "PROTIME" in the last 168 hours. Cardiac Enzymes: No results for input(s): "CKTOTAL", "CKMB", "CKMBINDEX", "TROPONINI" in the last 168 hours. BNP (last 3 results) No results for input(s): "PROBNP" in the last 8760 hours. HbA1C: No results for input(s): "HGBA1C" in the last 72 hours. CBG: No results for input(s): "GLUCAP" in the last 168 hours. Lipid Profile: No results for input(s): "CHOL", "HDL", "LDLCALC", "TRIG", "CHOLHDL", "LDLDIRECT" in the last 72 hours. Thyroid Function Tests: No results for input(s): "TSH", "T4TOTAL", "FREET4", "T3FREE", "THYROIDAB" in the last 72 hours. Anemia Panel: No results for input(s): "VITAMINB12", "FOLATE", "FERRITIN", "TIBC", "IRON", "RETICCTPCT" in the last 72 hours. Sepsis Labs: No results for input(s): "PROCALCITON", "LATICACIDVEN" in the last 168 hours.  Recent Results (from the past 240 hour(s))  Resp panel by RT-PCR (RSV, Flu A&B, Covid) Anterior Nasal Swab     Status: None   Collection Time: 10/05/22 11:18 AM   Specimen: Anterior Nasal Swab  Result Value Ref Range Status   SARS Coronavirus 2 by RT PCR  NEGATIVE NEGATIVE Final    Comment: (NOTE) SARS-CoV-2 target nucleic acids are NOT DETECTED.  The SARS-CoV-2 RNA is generally detectable in upper respiratory specimens during the acute phase of infection. The lowest concentration of SARS-CoV-2 viral copies this assay can detect is 138 copies/mL. A negative result does not preclude SARS-Cov-2 infection and should not be used as the sole basis for treatment or other patient management decisions. A negative result may occur with  improper specimen collection/handling, submission of specimen other than nasopharyngeal swab, presence of viral mutation(s) within the areas targeted by this assay, and inadequate number of viral copies(<138 copies/mL). A negative result must be combined with clinical observations, patient history, and epidemiological information. The expected result is Negative.  Fact Sheet for Patients:  BloggerCourse.com  Fact Sheet for Healthcare Providers:  SeriousBroker.it  This test is no t yet approved or cleared by the [place] FDA and  has been authorized for detection and/or diagnosis of SARS-CoV-2 by FDA under an Emergency Use Authorization (EUA). This EUA will remain  in effect (meaning this test can be used) for the duration of the COVID-19 declaration under Section 564(b)(1) of the Act, 21 U.S.C.section 360bbb-3(b)(1), unless the authorization is terminated  or revoked sooner.       Influenza A by PCR  NEGATIVE NEGATIVE Final   Influenza B by PCR NEGATIVE NEGATIVE Final    Comment: (NOTE) The Xpert Xpress SARS-CoV-2/FLU/RSV plus assay is intended as an aid in the diagnosis of influenza from Nasopharyngeal swab specimens and should not be used as a sole basis for treatment. Nasal washings and aspirates are unacceptable for Xpert Xpress SARS-CoV-2/FLU/RSV testing.  Fact Sheet for Patients: BloggerCourse.com  Fact Sheet for  Healthcare Providers: SeriousBroker.it  This test is not yet approved or cleared by the [place] FDA and has been authorized for detection and/or diagnosis of SARS-CoV-2 by FDA under an Emergency Use Authorization (EUA). This EUA will remain in effect (meaning this test can be used) for the duration of the COVID-19 declaration under Section 564(b)(1) of the Act, 21 U.S.C. section 360bbb-3(b)(1), unless the authorization is terminated or revoked.     Resp Syncytial Virus by PCR NEGATIVE NEGATIVE Final    Comment: (NOTE) Fact Sheet for Patients: BloggerCourse.com  Fact Sheet for Healthcare Providers: SeriousBroker.it  This test is not yet approved or cleared by the [place] FDA and has been authorized for detection and/or diagnosis of SARS-CoV-2 by FDA under an Emergency Use Authorization (EUA). This EUA will remain in effect (meaning this test can be used) for the duration of the COVID-19 declaration under Section 564(b)(1) of the Act, 21 U.S.C. section 360bbb-3(b)(1), unless the authorization is terminated or revoked.  Performed at [facility], [address] [place], [place] [address]   Expectorated Sputum Assessment w Gram Stain, Rflx to Resp Cult     Status: None   Collection Time: 10/06/22  8:50 AM   Specimen: Sputum  Result Value Ref Range Status   Specimen Description SPUTUM  Final   Special Requests NONE  Final   Sputum evaluation   Final    THIS SPECIMEN IS ACCEPTABLE FOR SPUTUM CULTURE Performed at [facility], [address], [place], [place] [address]    Report Status 10/06/2022 FINAL  Final  Culture, Respiratory w Gram Stain     Status: None (Preliminary result)   Collection Time: 10/06/22  8:50 AM   Specimen: SPU  Result Value Ref Range Status   Specimen Description   Final    SPUTUM Performed at [facility], [address] [place], [place] [address]    Special Requests   Final    NONE Reflexed from [phone] Performed at [facility], [address], [place], [place] [address]    Gram Stain   Final    RARE WBC PRESENT,BOTH PMN AND MONONUCLEAR MODERATE GRAM POSITIVE COCCI IN PAIRS FEW GRAM NEGATIVE RODS Performed at [facility], [address], [place], [place] [address]    Culture PENDING  Incomplete   Report Status PENDING  Incomplete         Radiology Studies: DG CHEST PORT 1 VIEW  Result Date: 10/06/2022 CLINICAL DATA:  Chest tube placement. EXAM: PORTABLE CHEST 1 VIEW COMPARISON:  October 05, 2022 FINDINGS: Right-sided chest tube has been placed. No evidence of pneumothorax. Similar opacification of most of the right lung. The left lung is clear. IMPRESSION: 1. Right-sided chest tube has been placed. No evidence of pneumothorax. 2. Similar opacification of most of the right lung. Electronically Signed   By: Ted Mcalpine M.D.   On: 10/06/2022 12:17   US Abdomen Limited RUQ (LIVER/GB)  Result Date: 10/05/2022 CLINICAL DATA:  Elevated LFTs EXAM: ULTRASOUND ABDOMEN LIMITED RIGHT UPPER QUADRANT COMPARISON:  PET/CT  04/24/2020 FINDINGS: Gallbladder: No gallstones or wall thickening visualized. No sonographic Murphy sign noted by sonographer. Common bile duct: Diameter: 3 mm Liver: No focal lesion identified. Increased parenchymal echogenicity with coarse echotexture. Portal vein is patent on color Doppler imaging with normal direction of blood flow towards the liver. Other: Exam is compromised by body habitus and bowel gas. A right pleural effusion is noted. IMPRESSION: 1. Hepatic steatosis. 2. Partially visualized right pleural effusion. Electronically Signed   By: Minerva Fester M.D.   On: 10/05/2022 18:58   US THORACENTESIS ASP PLEURAL SPACE W/IMG GUIDE  Result Date: 10/05/2022 INDICATION: Patient with history of multiple myeloma in remission; now with cough,  dyspnea, right pleural effusion. Request received for diagnostic and therapeutic right thoracentesis EXAM: ULTRASOUND GUIDED DIAGNOSTIC AND THERAPEUTIC RIGHT THORACENTESIS MEDICATIONS: 8 mL of 1% lidocaine COMPLICATIONS: None immediate. PROCEDURE: An ultrasound guided thoracentesis was thoroughly discussed with the patient and questions answered. The benefits, risks, alternatives and complications were also discussed. The patient understands and wishes to proceed with the procedure. Written consent was obtained. Ultrasound was performed to localize and mark an adequate pocket of fluid in the right chest. The area was then prepped and draped in the normal sterile fashion. 1% Lidocaine was used for local anesthesia. Under ultrasound guidance a 6 Fr Safe-T-Centesis catheter was introduced. Thoracentesis was performed. The catheter was removed and a dressing applied. FINDINGS: A total of approximately 115 cc of yellow fluid was removed. Samples were sent to the laboratory as requested by the clinical team. The pleural fluid collection was multiloculated and only the above amount could be aspirated at this time. IMPRESSION: Successful ultrasound guided diagnostic and therapeutic RIGHT thoracentesis yielding 115 mL of pleural fluid. Performed by: Artemio Aly Electronically Signed   By: Roanna Banning M.D.   On: 10/05/2022 17:05   DG Chest 1 View  Result Date: 10/05/2022 CLINICAL DATA:  [id number] S/P thoracentesis [id number] EXAM: PORTABLE CHEST 1 VIEW COMPARISON:  CT chest, 10/05/2022.  IR ultrasound, earlier same day. FINDINGS: Cardiac silhouette is within normal limits. Persistently obscured RIGHT cardiac border. The LEFT lung is clear. Similar dense consolidation of the RIGHT lower lobe. No pneumothorax. No interval osseous abnormality. IMPRESSION: 1. No pneumothorax post RIGHT thoracentesis 2. Persistent dense consolidation of the RIGHT lower lobe. Consider 4 weeks chest XR follow-up after treatment to ensure  resolution Electronically Signed   By: Roanna Banning M.D.   On: 10/05/2022 16:59   CT Chest Wo Contrast  Result Date: 10/05/2022 CLINICAL DATA:  Pleural effusion. Shortness of breath. Malignancy suspected. History of multiple myeloma. EXAM: CT CHEST WITHOUT CONTRAST TECHNIQUE: Multidetector CT imaging of the chest was performed following the standard protocol without IV contrast. RADIATION DOSE REDUCTION: This exam was performed according to the departmental dose-optimization program which includes automated exposure control, adjustment of the mA and/or kV according to patient size and/or use of iterative reconstruction technique. COMPARISON:  Chest x-ray same day. CT chest abdomen and pelvis 04/02/2020. FINDINGS: Cardiovascular: No significant vascular findings. Normal heart size. No pericardial effusion. Mediastinum/Nodes: There are enlarged paratracheal lymph nodes measuring up to 15 mm short axis. Enlarged right hilar lymph node measures up to 11 mm short axis. Visualized esophagus and thyroid gland are within normal limits. Lungs/Pleura: There is a moderate-sized loculated right pleural effusion. There is underlying atelectasis/compression of the right lower lobe and right middle lobe. There is also some patchy and strandy opacities in the right lung apex. Left lung appears clear. Upper Abdomen: No acute abnormality.  Musculoskeletal: Numerous lytic lesions are seen throughout the osseous structures. These have mildly increased in size and number when compared to the prior study. Mild compression deformities of the superior endplate of T7 and T8 appear new from prior. No retropulsion of fracture fragments. IMPRESSION: 1. Moderate-sized loculated right pleural effusion 2. Atelectasis/compression of the right lower lobe and right middle lobe. Underlying pneumonia or neoplasm not excluded. 3. Patchy and strandy opacities in the right lung apex worrisome for infection. 4. Mediastinal and right hilar  lymphadenopathy. 5. Diffuse lytic lesions throughout the osseous structures compatible with patient's history of multiple myeloma. These have mildly increased in size and number when compared to the prior study. 6. Mild compression deformities of the superior endplates of T7 and T8 appear new from prior. Electronically Signed   By: Darliss Cheney M.D.   On: 10/05/2022 16:00   DG Chest 2 View  Result Date: 10/05/2022 CLINICAL DATA:  Shortness of breath. EXAM: CHEST - 2 VIEW COMPARISON:  Chest radiograph 04/04/2020.  Chest CT 04/02/2020 FINDINGS: Moderate to large right pleural effusion, occupying the lower 2/3 of right hemithorax. No definite leftward mediastinal shift suggesting underlying compressive atelectasis in the right lung. There are streaky right suprahilar opacities. The heart is upper normal in size, although primarily obscured. No focal airspace disease in the left lung. There are bilateral lytic rib lesions. Many of the additional bone lesions on CT are not well-defined by radiograph. IMPRESSION: 1. Moderate to large right pleural effusion, occupying the lower 2/3 of the right hemithorax. Streaky right suprahilar opacities, favor. 2. Bilateral lytic rib lesions consistent with known multiple myeloma. Electronically Signed   By: Narda Rutherford M.D.   On: 10/05/2022 13:43        Scheduled Meds:  acyclovir  400 mg Oral BID   amLODipine  5 mg Oral QHS   fluticasone furoate-vilanterol  1 puff Inhalation Daily   gabapentin  100 mg Oral TID   medroxyPROGESTERone  10 mg Oral Daily   metoprolol tartrate  50 mg Oral BID   nicotine  21 mg Transdermal Q24H   pantoprazole  40 mg Oral Daily   potassium chloride SA  20 mEq Oral BID   pravastatin  40 mg Oral Daily   sodium chloride flush  10 mL Intrapleural Q8H   Continuous Infusions:  cefTRIAXone (ROCEPHIN)  IV 2 g (10/05/22 2258)   doxycycline (VIBRAMYCIN) IV 100 mg (10/06/22 0612)   [START ON 10/07/2022] sodium bicarbonate 150 mEq in  sterile water 1,150 mL infusion     sodium chloride       LOS: 1 day    Time spent: 40 minutes    Dorcas Carrow, MD [facility]

## 2022-10-06 NOTE — Consult Note (Signed)
Renal Service Consult Note [facility]  Stacey Montoya 10/06/2022 Maree Krabbe, MD Requesting Physician: Dr. Corrie Mckusick  Reason for Consult: Renal failure HPI: The patient is a 47 y.o. year-old w/ PMH as below who presented 8/19 to ED for SOB, R-sided chest pain and coughing, started 5 days ago. Brown mucus coming up. 1st day last Thursday had diarrhea "all day". Then had fevers on Fri/ Sat. Tried to reach PCP on Sat but couldn't get them. Came to ED yesterday. ED showed afebrile, BP's good, Na 128, K 3.2, creat 4.2 up from 1.2 baseline. CT showed RUL consolidation and moderate effusion. Pt was admitted and IV abx started for R PNA. CCM placed a chest tube this am. Pt got  no bolus, just got 50 cc/hr of NS for a bit overnight. Creat 4.7 this am, up from 4.2. We are asked to see for renal faliure.    Pt seen in room.  Hx as above, lots of diarrhea last week. She wears stockings and if she doesn't wear them, or forgets to take her lasix, her ankles "swell". Starting to feel a little better. No nsaids.   Hx of myeloma 2022 sp BM transplant in remission. F/b Dr Candise Che, they do myeloma panels every 2 mos.   ROS - denies CP, no joint pain, no HA, no blurry vision, no rash, no diarrhea, no nausea/ vomiting, no dysuria, no difficulty voiding   Past Medical History  Past Medical History:  Diagnosis Date   Abnormal Pap smear of cervix    Back pain    Cancer (HCC) 04/02/2020   multiple myeloma   Hyperlipidemia    Hypertension    Past Surgical History  Past Surgical History:  Procedure Laterality Date   CERVICAL CONIZATION W/BX  12/02/2010   Procedure: CONIZATION CERVIX WITH BIOPSY;  Surgeon: Melony Overly;  Location: [facility];  Service: Gynecology;  Laterality: N/A;  COLD KNIFE   CESAREAN SECTION     DILATION AND CURETTAGE OF UTERUS  12/02/2010   Procedure: DILATATION AND CURETTAGE (D&C);  Surgeon: Melony Overly;  Location: [facility];  Service: Gynecology;  Laterality: N/A;    IR RADIOLOGIST EVAL & MGMT  04/16/2020   KYPHOPLASTY Bilateral 09/05/2020   Procedure: KYPHOPLASTY THORACIC TWELVE AND LUMBAR FOUR;  Surgeon: Lisbeth Renshaw, MD;  Location: [facility];  Service: Neurosurgery;  Laterality: Bilateral;   left hand     drain - infection   WISDOM TOOTH EXTRACTION     Family History  Family History  Problem Relation Age of Onset   Lung cancer Maternal Grandfather    Pancreatic cancer Paternal Grandfather    Social History  reports that she has quit smoking. Her smoking use included cigarettes. She has a 10 pack-year smoking history. She has never used smokeless tobacco. She reports that she does not currently use alcohol. She reports that she does not currently use drugs. Allergies  Allergies  Allergen Reactions   Duloxetine Hcl Hives   Zithromax [Azithromycin] Rash   Home medications Prior to Admission medications   Medication Sig Start Date End Date Taking? Authorizing Provider  acyclovir (ZOVIRAX) 400 MG tablet Take 1 tablet (400 mg total) by mouth 2 (two) times daily. 07/14/22  Yes Johney [place], MD  albuterol (VENTOLIN HFA) 108 (90 Base) MCG/ACT inhaler Inhale 1-2 puffs into the lungs every 6 (six) hours as needed for wheezing. 02/01/20  Yes [provider]  amLODipine (NORVASC) 5 MG tablet Take 1 tablet (5 mg  total) by mouth daily. Patient taking differently: Take 5 mg by mouth at bedtime. 07/10/20 10/05/22 Yes Johney [place], MD  b complex vitamins capsule Take 1 capsule by mouth daily. 06/10/20  Yes Kale, Corene Cornea, MD  ELIQUIS 5 MG TABS tablet TAKE 1 TABLET BY MOUTH TWICE DAILY. Patient taking differently: Take 5 mg by mouth 2 (two) times daily. 09/22/22  Yes Johney [place], MD  ergocalciferol (VITAMIN D2) 1.25 MG (50000 UT) capsule Take 1 capsule (50,000 Units total) by mouth 2 (two) times a week. 06/10/20  Yes Johney [place], MD  gabapentin (NEURONTIN) 300 MG capsule TAKE 1 CAPSULES IN THE MORNING, TAKE 2 CAPSULES IN  THE AFTERNOON, AND TAKE 3 CAPSULES AT BEDTIME Patient taking differently: Take 300 mg by mouth See admin instructions. TAKE 1 CAPSULES IN THE MORNING, TAKE 2 CAPSULES IN THE AFTERNOON, AND TAKE 3 CAPSULES AT BEDTIME 09/16/22  Yes Thayil, Irene T, PA-C  lenalidomide (REVLIMID) 10 MG capsule TAKE 1 CAPSULE BY MOUTH DAILY  FOR 21 DAYS, THEN 7 DAYS OFF Patient taking differently: Take 10 mg by mouth See admin instructions. TAKE 1 CAPSULE BY MOUTH DAILY  FOR 21 DAYS, THEN 7 DAYS OFF 09/22/22  Yes Johney [place], MD  medroxyPROGESTERone (PROVERA) 10 MG tablet Take 10 mg by mouth daily.   Yes [provider]  metoprolol tartrate (LOPRESSOR) 50 MG tablet Take 1 tablet (50 mg total) by mouth 2 (two) times daily. 07/10/20 10/05/22 Yes Johney [place], MD  omeprazole (PRILOSEC) 20 MG capsule Take 20 mg by mouth daily. 09/11/22  Yes [provider]  ondansetron (ZOFRAN) 4 MG tablet Take 8 mg by mouth every 8 (eight) hours. 03/19/20  Yes [provider]  oxyCODONE (OXY IR/ROXICODONE) 5 MG immediate release tablet TAKE 1 TABLET EVERY 6 HOURS AS NEEDED FOR PAIN. Patient taking differently: Take 5 mg by mouth every 6 (six) hours as needed for moderate pain. for pain 10/02/22  Yes Johney [place], MD  polyethylene glycol (MIRALAX / GLYCOLAX) 17 g packet Take 17 g by mouth daily as needed for mild constipation. 09/05/20  Yes Lisbeth Renshaw, MD  potassium chloride SA (KLOR-CON M) 20 MEQ tablet TAKE (1) TABLET BY MOUTH TWICE DAILY. Patient taking differently: Take 20 mEq by mouth 2 (two) times daily. 09/14/22  Yes Georga Kaufmann T, PA-C  pravastatin (PRAVACHOL) 40 MG tablet Take 40 mg by mouth daily.   Yes [provider]  prochlorperazine (COMPAZINE) 10 MG tablet Take 1 tablet (10 mg total) by mouth every 6 (six) hours as needed for nausea or vomiting. 05/13/22  Yes Georga Kaufmann T, PA-C  furosemide (LASIX) 20 MG tablet TAKE 3 TABLETS BY MOUTH DAILY. 10/06/22   Johney [place], MD     Vitals:   10/06/22 1000 10/06/22 1018 10/06/22 1300 10/06/22 1313  BP: (!) 145/75 135/84 120/88   Pulse: 99 100 88   Resp:      Temp:   98.5 F (36.9 C)   TempSrc:   Oral   SpO2: 93% 91% (!) 89% 94%  Weight:      Height:       Exam Gen alert, no distress No rash, cyanosis or gangrene Sclera anicteric, throat clear  No jvd or bruits, flat neck veins Chest clear bilat to bases, R-sided chest tube, no rales/ wheezing RRR no RG Abd soft ntnd no mass or ascites +bs GU defer MS no joint effusions or deformity Ext no LE or UE edema, no  wounds or ulcers Neuro is alert, Ox 3 , nf     Home meds include - albuterol, lasix 60mg  every day prn, acyclovir po 400 bid, norvasc 5 hs, eliquis, gabapentin, revlimid, provera, lopressor, prilosec, ocyIR, klorcon, pravachol    Date   Creat  eGFR   Feb 2022  3.38 >> 0.80 AKI episode related to new myeloma, ^Ca++   Rest of 2022  0.66- 0.85   Feb- jun 2023 0.80- 0.97   Oct -dec 2023 1.05- 1.60   Jan 2024  1.13  > 60 ml/min   Feb 2024  1.53    Mar 2024  1.16    06/30/22  1.22  56 ml/min    07/14/22  1.18    7/08   1.44    7/31   1.21    8/19   4.24    10/06/22  4.75     UA prot 30, 0-5 rbc/ wbc/ epi   UNa and UCr pending    Renal US - pending     Na 129  K 3.1  CO2 15   BUN 39  creat 4.75  alb 3.1   AST 78 ALT 171 tbili 1.3     BNP 112     WBC 6.9  Hb 11       Assessment/ Plan: AKI on CKD 3a - b/l creat 1.13- 1.22, eGFR 56- >60 ml/min, from early 2024. Creatinine here was 4.2 on admission yesterday, up to 4.75 today. Just had 5 day illness at home w/ fevers, diarrhea, and admitted w/ PNA and large pleural effusion sp chest tube. On exam is euvolemic, possibly dry. No acei/ ARB, no contrast, no nsaids, no hypotension. H/o myeloma but has been in remission since BM transplant in 2022. Suspect volume depletion primarily, vs recurrence of myeloma (less likely). Will bolus 3 L NS overnight, then follow with bicarb gtt at 125  cc/hr. F/u labs in am. Myeloma panel ordered. Will follow.  H/o multiple myeloma - sp autologous stem cell reinfusion (SCT) on 10/15/20 HTN - takes norvasc and lopressor at home. Can keep these as long as SBP's are not < 115.       Vinson Moselle  MD [facility] 10/06/2022, 1:31 PM  Recent Labs  Lab 10/05/22 1130 10/06/22 0437  HGB 11.6* 11.0*  ALBUMIN 3.1*  --   CALCIUM 8.8* 8.7*  CREATININE 4.24* 4.75*  K 3.2* 3.1*   Inpatient medications:  acyclovir  400 mg Oral BID   amLODipine  5 mg Oral QHS   fluticasone furoate-vilanterol  1 puff Inhalation Daily   gabapentin  100 mg Oral TID   medroxyPROGESTERone  10 mg Oral Daily   metoprolol tartrate  50 mg Oral BID   nicotine  21 mg Transdermal Q24H   pantoprazole  40 mg Oral Daily   potassium chloride SA  20 mEq Oral BID   pravastatin  40 mg Oral Daily   sodium chloride flush  10 mL Intrapleural Q8H    cefTRIAXone (ROCEPHIN)  IV 2 g (10/05/22 2258)   doxycycline (VIBRAMYCIN) IV 100 mg (10/06/22 0612)   acetaminophen **OR** acetaminophen, albuterol, HYDROmorphone (DILAUDID) injection, ondansetron **OR** ondansetron (ZOFRAN) IV, oxyCODONE, traZODone

## 2022-10-06 NOTE — TOC Initial Note (Signed)
Transition of Care [facility]) - Initial/Assessment Note    Patient Details  Name: Stacey Montoya MRN: [id number] Date of Birth: [birth year]  [facility]) CM/SW Contact:    Howell Rucks, RN Phone Number: 10/06/2022, 11:36 AM  Clinical Narrative:  Met with pt and pt's spouse at  bedside to introduce role of TOC/NCM and review for dc planning. Pt confirmed she has a PCP and pharmacy in place, no current home care services, reports she has a walker, cane and shower chair at home she uses as needed. Pt reports she feels safe returning home with good family support, reports family will provide transportation at discharge. TOC will continue to follow for possible HH needs, pt had placement of chest tube.                  Expected Discharge Plan: Home/Self Care Barriers to Discharge: Continued Medical Work up   Patient Goals and CMS Choice Patient states their goals for this hospitalization and ongoing recovery are:: return home with family support          Expected Discharge Plan and Services       Living arrangements for the past 2 months: Single Family Home                                      Prior Living Arrangements/Services Living arrangements for the past 2 months: Single Family Home Lives with:: Spouse Patient language and need for interpreter reviewed:: Yes Do you feel safe going back to the place where you live?: Yes      Need for Family Participation in Patient Care: Yes (Comment) Care giver support system in place?: Yes (comment) Current home services: DME (walker, cane, shower  chair) Criminal Activity/Legal Involvement Pertinent to Current Situation/Hospitalization: No - Comment as needed  Activities of Daily Living Home Assistive Devices/Equipment: Eyeglasses, Shower chair with back, Environmental consultant (specify type), Cane (specify quad or straight) ADL Screening (condition at time of admission) Patient's cognitive ability adequate to safely complete  daily activities?: Yes Is the patient deaf or have difficulty hearing?: No Does the patient have difficulty seeing, even when wearing glasses/contacts?: No Does the patient have difficulty concentrating, remembering, or making decisions?: No Patient able to express need for assistance with ADLs?: Yes Does the patient have difficulty dressing or bathing?: No Independently performs ADLs?: Yes (appropriate for developmental age) Does the patient have difficulty walking or climbing stairs?: No Weakness of Legs: Both Weakness of Arms/Hands: None  Permission Sought/Granted Permission sought to share information with : Case Manager Permission granted to share information with : Yes, Verbal Permission Granted  Share Information with NAME: Fannie Knee, RN           Emotional Assessment Appearance:: Appears stated age Attitude/Demeanor/Rapport: Gracious Affect (typically observed): Accepting Orientation: : Oriented to Self, Oriented to Place, Oriented to  Time, Oriented to Situation Alcohol / Substance Use: Not Applicable Psych Involvement: No (comment)  Admission diagnosis:  Pleural effusion [J90] Hypoxia [R09.02] AKI (acute kidney injury) (HCC) [N17.9] Patient Active Problem List   Diagnosis Date Noted   Pleural effusion 10/05/2022   Malignant neoplasm metastatic to bone (HCC) 04/15/2020   Encounter for antineoplastic chemotherapy    Counseling regarding advance care planning and goals of care 04/05/2020   Drug-induced constipation    Neoplasm related pain    Hypercalcemia of malignancy 04/02/2020   Lytic bone lesions on  xray 04/02/2020   Hypertensive urgency 04/02/2020   Normocytic anemia 04/02/2020   AKI (acute kidney injury) (HCC) 04/02/2020   Hyponatremia 04/02/2020   Multiple myeloma (HCC)    PCP:  Benita Stabile, MD Pharmacy:   [facility] - [place], [place] - [phone] [place] [address] Phone: [phone] Fax:  [phone]  [facility] - [place], [place] - [address] [place] [address] Phone: [phone] Fax: [phone]  [facility] [address] [place] [address] Phone: [phone] Fax: [phone]     Social Determinants of Health (SDOH) Social History: SDOH Screenings   Food Insecurity: No Food Insecurity (10/05/2022)  Housing: Low Risk  (10/05/2022)  Transportation Needs: No Transportation Needs (10/05/2022)  Utilities: Not At Risk (10/05/2022)  Tobacco Use: Medium Risk (10/05/2022)   SDOH Interventions:     Readmission Risk Interventions    10/06/2022   11:35 AM 04/09/2020    2:08 PM  Readmission Risk Prevention Plan  Transportation Screening Complete Complete  PCP or Specialist Appt within 3-5 Days Complete   HRI or Home Care Consult Complete   Social Work Consult for Recovery Care Planning/Counseling Complete   Palliative Care Screening Not Applicable   Medication Review Oceanographer) Complete Complete  PCP or Specialist appointment within 3-5 days of discharge  Complete  HRI or Home Care Consult  Patient refused  SW Recovery Care/Counseling Consult  Complete  Palliative Care Screening  Not Applicable  Skilled Nursing Facility  Not Applicable

## 2022-10-06 NOTE — Progress Notes (Signed)
Assisted with chest tube placement. Consent completed and placed in chart. Vitals stable through procedure. Placed on 20 cm suction. No complaints at this time.   Please call RRRN if questions arise regarding chest tube or patient.

## 2022-10-06 NOTE — Plan of Care (Signed)
  Problem: Pain Managment: Goal: General experience of comfort will improve Outcome: Progressing   Problem: Safety: Goal: Ability to remain free from injury will improve Outcome: Progressing   

## 2022-10-06 NOTE — Progress Notes (Addendum)
NAME:  Stacey Montoya, MRN:  [id number], DOB:  [birth year], LOS: 1 ADMISSION DATE:  10/05/2022, CONSULTATION DATE:  8/19 REFERRING MD:  Kirby Crigler , CHIEF COMPLAINT:  complex right pleural effusion    History of Present Illness:  47 year old female w/ sig h/o RISS stage 3 multiple myeloma s/p high dose chemo and autologous stem cell transplant back in Aug 2022.  Marland Kitchen Last seen by Oncology 7/31. During that visit noted myeloma stable  and she is being managed on Revlimid and zometa EO month. Her disease is felt to be stable w/ the exception of prior hypercalcemia and recent AKI back in May 2024. Presents to ER 8/19 w/ cc right sided pleuritic chest pain, cough productive of thick brown sputum, wheezing and increased shortness of breath. Onset initially noted on 8/15 w/ fever > 101 full body aches and chills. No obvious sick exposures but was in contact w/ young child prior. In ER RVP neg, BNP 112, wbc 6.7. A CT of chest was obtained w/out contrast. This showed loculated right pleural effusion w/ associated RLL and RML airspace disease. Cultures were sent. She was started on empiric antibiotics and referred to interventional radiology where she underwent right sided thoracentesis. The procedure was stopped after only being able top  drain 115 ml of yellow pleural fluid. Review of US showed multiple septations. Initial pleural LDH 401. 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 ABX started. Right sided thoracentesis  exudate removed. Volume limited due to septations and loculation of pleural fluid. PCCM asked to eval  8/20 right sided small bore CT placed. Initially 90cc concentrated yellow pleural fluid.   Interim History / Subjective:  Didn't sleep well. Still has pain and SOB w/ cough   Objective   Blood pressure 135/84, pulse 100, temperature 98.9 F (37.2 C), temperature source Oral, resp. rate 19, height 5' 3.25" (1.607 m), weight 117.9 kg, SpO2 91%.        Intake/Output Summary (Last 24 hours) at 10/06/2022 1048 Last data filed at 10/06/2022 0800 Gross per 24 hour  Intake 416.82 ml  Output 900 ml  Net -483.18 ml   Filed Weights   10/05/22 1049  Weight: 117.9 kg    Examination: General resting in bed. No distress remains on [place] HENT NCAT no JVD  Pulm dec'd wheezing c/w admit exam. Still some scattered rhonchi. The right chest tube is placed posterior laterally underneath right scapula. Good tidal. No air leak. 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 Hypokalemia  Abnormal LFTs Boarder line  Anion gap metabolic acidosis   Pulm problem list Acute hypoxic respiratory failure 2/2 CAP in patient w/ immunosuppression complicated further by complex/ exudative pleural effusion Plan Hold DOAC for now, can likely resume at or close to dc but would like to keep her off from it while we do pleural lytic therapy  CT to 20cm sxn Repeat CXR today w/ plan for pleural lytics on 8/21 giving 48 hrs off DOAC to minimize bleed risk IV abx cont doxy and CTX day  2 Adding fluid culture and cytology to pleural studies  Supplemental oxygen   Tobacco abuse w/ wheezing Plan Started Breo  Nicotine patch Stop vaping    All other issues as mentioned above per IM service -of note scr cont to climb. Spoke w/ IM team. They are ordering POCRUS and going to try iVFs.    Best Practice (right click and "Reselect all SmartList Selections" daily)   Per primary    [name] ACNP-BC [facility] Pulmonary/Critical Care Pager # [phone] OR # [phone] if no answer  Attending attestation: Patient seen and examined, note reviewed with the signed Advanced Practice Provider. I personally reviewed laboratory data, imaging studies and relevant notes. I independently examined the patient and formulated the important aspects of the plan. Comments or changes to the note/plan are indicated below.   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 Reported fever and chills on 8/15. CT Chest with loculated right pleural effusion. IR attempted thoracentesis but only able to drain 115 ml of fluid. Pulmonary consulted.   Chest tube placed this morning. 250 cc chest tube output with thick secretions clogging line. Flushed line at bedside until line cleared  Blood pressure 120/88, pulse 88, temperature 98.5 F (36.9 C), temperature source Oral, resp. rate 19, height 5' 3.25" (1.607 m), weight 117.9 kg, SpO2 94%.  Physical Exam: General: Well-appearing, no acute distress HENT: [place], AT Eyes: EOMI, no scleral icterus Respiratory: Right chest tube in place with sluggish drainage Neuro: AAO x4, CNII-XII grossly intact Psych: Normal mood, normal affect  Na improving to 129 CO2 15 BUN/Cr increasing to 39/4.75  CT Chest 10/05/22 Moderate-sized loculated right pleural effusion with RML/RLL  compression, patchy opacities on right side. Diffuse lytic lesions c/w known multiple myeloma   Acute hypoxemic respiratory failure 2/2 right loculated pleural effusion with CAP --Last Eliquis 8/19 AM --S/p chest tube placement 8/20 --Chest tube to suction -20 cm H20 --Plan for lytics in AM after 48 hours off AC. Counseled on risk including bleeding. Patient expressed understanding and agrees to plan --Wean supplemental O2 for goal >88% --Continue CAP coverage --IS, OOB  Wheezing Nicotine abuse Former smoker --Bronchodilators: Breo and PRN albuterol --Nicotine patch --Counseled on smoking cessation    Care Time: 50 Minutes.   Mechele Collin, M.D. [facility] Pulmonary/Critical Care Medicine 10/06/2022 5:38 PM   Please see Amion for pager number to reach on-call Pulmonary and Critical Care Team.

## 2022-10-07 ENCOUNTER — Inpatient Hospital Stay (HOSPITAL_COMMUNITY): Payer: 59

## 2022-10-07 DIAGNOSIS — J90 Pleural effusion, not elsewhere classified: Secondary | ICD-10-CM | POA: Diagnosis not present

## 2022-10-07 DIAGNOSIS — J918 Pleural effusion in other conditions classified elsewhere: Secondary | ICD-10-CM | POA: Diagnosis not present

## 2022-10-07 LAB — RENAL FUNCTION PANEL
Albumin: 2.6 g/dL — ABNORMAL LOW (ref 3.5–5.0)
Anion gap: 13 (ref 5–15)
BUN: 44 mg/dL — ABNORMAL HIGH (ref 6–20)
CO2: 15 mmol/L — ABNORMAL LOW (ref 22–32)
Calcium: 8.2 mg/dL — ABNORMAL LOW (ref 8.9–10.3)
Chloride: 105 mmol/L (ref 98–111)
Creatinine, Ser: 4.68 mg/dL — ABNORMAL HIGH (ref 0.44–1.00)
GFR, Estimated: 11 mL/min — ABNORMAL LOW (ref >60)
Glucose, Bld: 98 mg/dL (ref 70–99)
Phosphorus: 4.7 mg/dL — ABNORMAL HIGH (ref 2.5–4.6)
Potassium: 4.1 mmol/L (ref 3.5–5.1)
Sodium: 133 mmol/L — ABNORMAL LOW (ref 135–145)

## 2022-10-07 MED ORDER — DOCUSATE SODIUM 100 MG PO CAPS
100.0000 mg | ORAL_CAPSULE | Freq: Two times a day (BID) | ORAL | Status: DC
  Administered 2022-10-07 – 2022-10-18 (×20): 100 mg via ORAL
  Filled 2022-10-07 (×24): qty 1

## 2022-10-07 MED ORDER — SODIUM CHLORIDE 0.9% FLUSH
10.0000 mL | INTRAVENOUS | Status: DC
  Administered 2022-10-07 – 2022-10-09 (×8): 10 mL via INTRAPLEURAL

## 2022-10-07 MED ORDER — SODIUM CHLORIDE 0.9% FLUSH
10.0000 mL | Freq: Three times a day (TID) | INTRAVENOUS | Status: DC
  Administered 2022-10-07: 10 mL via INTRAPLEURAL

## 2022-10-07 MED ORDER — STERILE WATER FOR INJECTION IJ SOLN
5.0000 mg | Freq: Once | RESPIRATORY_TRACT | Status: AC
  Administered 2022-10-07: 5 mg via INTRAPLEURAL
  Filled 2022-10-07: qty 5

## 2022-10-07 MED ORDER — STERILE WATER FOR INJECTION IJ SOLN
5.0000 mg | Freq: Once | RESPIRATORY_TRACT | Status: DC
  Filled 2022-10-07: qty 5

## 2022-10-07 MED ORDER — SODIUM CHLORIDE (PF) 0.9 % IJ SOLN
10.0000 mg | Freq: Once | INTRAMUSCULAR | Status: AC
  Administered 2022-10-07: 10 mg via INTRAPLEURAL
  Filled 2022-10-07: qty 10

## 2022-10-07 MED ORDER — SODIUM CHLORIDE (PF) 0.9 % IJ SOLN
10.0000 mg | Freq: Once | INTRAMUSCULAR | Status: DC
  Filled 2022-10-07: qty 10

## 2022-10-07 NOTE — Progress Notes (Addendum)
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  Marland Kitchen Last seen by Oncology 7/31. During that visit noted myeloma stable  and she is being managed on Revlimid and zometa EO month. Her disease is felt to be stable w/ the exception of prior hypercalcemia and recent AKI back in May 2024. Presents to ER 8/19 w/ cc right sided pleuritic chest pain, cough productive of thick brown sputum, wheezing and increased shortness of breath. Onset initially noted on 8/15 w/ fever > 101 full body aches and chills. No obvious sick exposures but was in contact w/ young child prior. In ER RVP neg, BNP 112, wbc 6.7. A CT of chest was obtained w/out contrast. This showed loculated right pleural effusion w/ associated RLL and RML airspace disease. Cultures were sent. She was started on empiric antibiotics and referred to interventional radiology where she underwent right sided thoracentesis. The procedure was stopped after only being able top  drain 115 ml of yellow pleural fluid. Review of US showed multiple septations. Initial pleural LDH 401. 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 ABX started. Right sided thoracentesis  exudate removed. Volume limited due to septations and loculation of pleural fluid. PCCM asked to eval  8/20 right sided small bore CT placed. Initially 90cc concentrated yellow pleural fluid.   Interim History / Subjective:  Chest tube output 300 cc/24 hours  Objective   Blood pressure 126/71, pulse 97, temperature 97.8 F (36.6 C), temperature source Oral, resp. rate 18, height 5' 3.25" (1.607 m), weight 117.9 kg, SpO2 93%.        Intake/Output Summary (Last 24 hours) at 10/07/2022 0758 Last data filed at 10/07/2022 4098 Gross per 24 hour  Intake 1795.22 ml  Output 6804 ml  Net -5008.78 ml   Filed Weights   10/05/22 1049  Weight: 117.9 kg    Physical Exam: General: Well-appearing, no acute distress HENT: [place], AT Eyes: EOMI, no scleral icterus Respiratory: Right posterior chest tube in place, unable to flush Extremities:-Edema,-tenderness Neuro: AAO x4, CNII-XII grossly intact Psych: Normal mood, normal affect  CT Chest 10/05/22 Moderate-sized loculated right pleural effusion with RML/RLL compression, patchy opacities on right side. 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 Continue to hold [facility] while on pleural lytics --S/p chest tube placement 8/20 --Chest tube to suction -20 cm H20 --Administed initial lytics after 48  hours off AC. Counseled on risk including bleeding. Patient expressed understanding and agrees to plan --Wean supplemental O2 for goal >88% --Continue CAP coverage. F/u culture and cytology --IS, OOB --Unable to administer TPA/DNAse due non-functioning chest tube. Suspect clotting. --CXR ordered --May need additional chest tube with IR  >Addendum: Discussed with IR attending. Requesting CT Chest without contrast. Also discussed that patient will likely need cardiothoracic consult for VATS  Wheezing Nicotine abuse Former smoker --Bronchodilators: Breo and PRN albuterol --Nicotine patch --Counseled on smoking cessation   Best Practice (right click and "Reselect all SmartList Selections" daily)   Care Time: 50 min including coordinating care with pharmacy and nursing team  Mechele Collin, M.D. [facility] Pulmonary/Critical Care Medicine 10/07/2022 8:00 AM   See Amion for personal pager For hours between 7 PM to 7 AM, please call Elink for urgent questions

## 2022-10-07 NOTE — Progress Notes (Signed)
Pleural Fibrinolytic Administration Procedure Note  Stacey Montoya  [id number]  October 03, 1975  Date:10/07/22  Time:4:23 PM   Provider Performing:Matheo Rathbone Mechele Collin   Procedure: Pleural Fibrinolysis Initial day [phone])  Indication(s) Fibrinolysis of complicated pleural effusion  Consent Risks of the procedure as well as the alternatives and risks of each were explained to the patient and/or caregiver.  Consent for the procedure was obtained.   Anesthesia None   Time Out Verified patient identification, verified procedure, site/side was marked, verified correct patient position, special equipment/implants available, medications/allergies/relevant history reviewed, required imaging and test results available.   Sterile Technique Hand hygiene, gloves   Procedure Description Existing pleural catheter was cleaned and accessed in sterile manner.  10mg  of tPA in 30cc of saline and 5mg  of dornase in 30cc of sterile water were injected into pleural space using existing pleural catheter.  Catheter will be clamped for 1 hour and then placed back to suction.   Complications/Tolerance None; patient tolerated the procedure well.  EBL None   Specimen(s) None

## 2022-10-07 NOTE — Progress Notes (Signed)
PCCM Progress Note  CT Chest imaging reviewed with placed of right pigtail in right loculated effusion. Discussed plan with IR and CT surgery and agreed that would be reasonable to re-attempt pleural lytics if possible. If not or if lytics unsuccessful then will plan for VATS with Dr. Dorris Fetch at the end of this week.  Patient updated on recommendations. After discussion patient would like to try pleural lytics. Chest tube flushed with 10 cc by bedside RN earlier this afternoon as well as by myself this afternoon with no issues. We discussed the risks of TPA/DNAse again including risk of localized bleed in the pleural space. With the concerns of chest tube clogging, this may potentially lead to medication being pleural space for prolonged period of time. Alternative would be replacing chest tube (patient declined) vs holding on medication and waiting on surgery. I addressed questions and concerns by patient and husband. After discussion, patient wished to pursue pleural lytics understanding the risks as noted above.  Orders to start pleural lytics today.  Mechele Collin, M.D. [facility] Pulmonary/Critical Care Medicine 10/07/2022 3:34 PM   See Amion for personal pager For hours between 7 PM to 7 AM, please call Elink for urgent questions

## 2022-10-07 NOTE — Plan of Care (Signed)
  Problem: Safety: Goal: Ability to remain free from injury will improve Outcome: Progressing   

## 2022-10-07 NOTE — Progress Notes (Signed)
[facility] Progress Note  Subjective: UOP recorded is very good yest and today, however creat is only down to 4.6 (0.1 pt change). BP's are wnl. Pt denies any SOB after 3 L bolus.  Repeat CXR today shows clear L lung, no edema.   Vitals:   10/07/22 0841 10/07/22 0850 10/07/22 1039 10/07/22 1341  BP:   125/71 137/74  Pulse:   96 (!) 105  Resp:      Temp:    98.6 F (37 C)  TempSrc:    Oral  SpO2: 97% 97%  91%  Weight:      Height:        Exam: Gen alert, no distress Chest clear bilat to bases, R-sided chest tube RRR no RG Abd soft ntnd no mass or ascites +bs Ext no LE edema Neuro is alert, Ox 3 , nf        Home meds include - albuterol, lasix 60mg  every day prn, acyclovir po 400 bid, norvasc 5 hs, eliquis, gabapentin, revlimid, provera, lopressor, prilosec, ocyIR, klorcon, pravachol     Date                           Creat               eGFR   Feb 2022                   3.38 >> 0.80    AKI episode related to new myeloma, ^Ca++   Rest of 2022              0.66- 0.85   Feb- jun 2023            0.80- 0.97   Oct -dec 2023           1.05- 1.60   Jan 2024                   1.13                 > 60 ml/min   Feb 2024                   1.53    Mar 2024                  1.16    06/30/22                     1.22                 56 ml/min    07/14/22                     1.18    7/08                          1.44    7/31                          1.21    8/19                          4.24    10/06/22                     4.75       UA prot 30, 0-5  rbc/ wbc/ epi   UNa 20, UCr 25    Renal US -  11.8/ 12.0 cm kidneys w/o hydro, echo wnl        Assessment/ Plan: AKI on CKD 3a - b/l creat 1.13- 1.22, eGFR 56- >60 ml/min, from early 2024. Creatinine here was 4.2 on admission yesterday, up to 4.75 today. Just had 5 day illness at home w/ fevers, diarrhea, and admitted w/ PNA and large pleural effusion sp chest tube. On exam looks euvolemic today. No acei/ ARB, no contrast,  no nsaids, no hypotension. H/o myeloma but has been in remission since BM transplant in 2022. UA negative, urine lytes unremarkable and renal US w/o obstruction. Unclear cause of AKI - vol depletion vs recurrence of myeloma vs ATN vs other. 3 L bolus given overnight w/o sig drop in creatinine. Serum free LC's ordered. No uremic signs, no indication for RRT at this time.  H/o multiple myeloma - sp autologous stem cell reinfusion (SCT) on 10/15/20 HTN - takes norvasc and lopressor at home. Can keep these as long as SBP's are not < 115.            Vinson Moselle MD  [facility] 10/07/2022, 4:51 PM  Recent Labs  Lab 10/05/22 1130 10/06/22 0437 10/07/22 0422  HGB 11.6* 11.0*  --   ALBUMIN 3.1*  --  2.6*  CALCIUM 8.8* 8.7* 8.2*  PHOS  --   --  4.7*  CREATININE 4.24* 4.75* 4.68*  K 3.2* 3.1* 4.1   No results for input(s): "IRON", "TIBC", "FERRITIN" in the last 168 hours. Inpatient medications:  acyclovir  400 mg Oral BID   amLODipine  5 mg Oral QHS   docusate sodium  100 mg Oral BID   fluticasone furoate-vilanterol  1 puff Inhalation Daily   gabapentin  100 mg Oral TID   medroxyPROGESTERone  10 mg Oral Daily   metoprolol tartrate  50 mg Oral BID   nicotine  21 mg Transdermal Q24H   pantoprazole  40 mg Oral Daily   potassium chloride SA  20 mEq Oral BID   pravastatin  40 mg Oral Daily   sodium chloride flush  10 mL Intrapleural Q8H    cefTRIAXone (ROCEPHIN)  IV 2 g (10/06/22 1724)   doxycycline (VIBRAMYCIN) IV 100 mg (10/06/22 1751)   sodium bicarbonate 150 mEq in sterile water 1,150 mL infusion Stopped (10/07/22 1534)   acetaminophen **OR** acetaminophen, albuterol, HYDROmorphone (DILAUDID) injection, ondansetron **OR** ondansetron (ZOFRAN) IV, oxyCODONE, traZODone

## 2022-10-07 NOTE — Progress Notes (Signed)
Hematology short note  Patient with history of multiple myeloma currently in remission based on labs on 09/16/2022 on maintenance Revlimid admitted with fevers increasing shortness of breath function tests and acute renal injury.  This appears to be likely related to with sputum cultures pending but Gram stain showing Diplococci and gram-negative bacteria concerning for pneumonia with the likely parapneumonic effusion. -Would recommend holding Revlimid at this time and not restarting until she is able to follow-up with [place] as outpatient. -Last myeloma panel 3 weeks ago did not show any overt concerns for myeloma progression. -Patient does not have any overt hypogammaglobinemia at this time to suggest need for IVIG in the context of pneumonia with possible sepsis. -plz call if any questions  Wyvonnia Lora MD MS Hematology/Oncology Physician [facility]

## 2022-10-07 NOTE — Consult Note (Signed)
Reason for Consult:Parapneumonic effusion Referring Physician: Dr. Baldwin Crown Stacey Montoya is an 47 y.o. female.  HPI: 47 yo woman with hx of multiple myeloma, hypertension, hyperlipidemia, obesity, and DVT.   She presented with progressive dyspnea, fever and malaise. Also pleuritic right sided CP.  She was hypoxic on arrival and required 3L [place] O2. She was found to have right upper lobe consolidation with a moderate right pleural effusion.  Also noted to have a markedly elevated creatinine. Thoracentesis attempted but only drained about 100 ml of fluid.  Pigtail catheter placed.drained about 300 ml of fluid, but this AM not able to aspirate from it.  Ct showed catheter in good position.  She had thrombolytics instilled earlier this afternoon.  A short time ago c/o right sided CP and required additional O2.  Myeloma in remission per Oncology. Past Medical History:  Diagnosis Date   Abnormal Pap smear of cervix    Back pain    Cancer (HCC) 04/02/2020   multiple myeloma   Hyperlipidemia    Hypertension     Past Surgical History:  Procedure Laterality Date   CERVICAL CONIZATION W/BX  12/02/2010   Procedure: CONIZATION CERVIX WITH BIOPSY;  Surgeon: Melony Overly;  Location: [facility];  Service: Gynecology;  Laterality: N/A;  COLD KNIFE   CESAREAN SECTION     DILATION AND CURETTAGE OF UTERUS  12/02/2010   Procedure: DILATATION AND CURETTAGE (D&C);  Surgeon: Melony Overly;  Location: [facility];  Service: Gynecology;  Laterality: N/A;   IR RADIOLOGIST EVAL & MGMT  04/16/2020   KYPHOPLASTY Bilateral 09/05/2020   Procedure: KYPHOPLASTY THORACIC TWELVE AND LUMBAR FOUR;  Surgeon: Lisbeth Renshaw, MD;  Location: [facility];  Service: Neurosurgery;  Laterality: Bilateral;   left hand     drain - infection   WISDOM TOOTH EXTRACTION      Family History  Problem Relation Age of Onset   Lung cancer Maternal Grandfather    Pancreatic cancer Paternal Grandfather     Social History:  reports that she has  quit smoking. Her smoking use included cigarettes. She has a 10 pack-year smoking history. She has never used smokeless tobacco. She reports that she does not currently use alcohol. She reports that she does not currently use drugs.  Allergies:  Allergies  Allergen Reactions   Duloxetine Hcl Hives   Zithromax [Azithromycin] Rash    Medications: Scheduled:  acyclovir  400 mg Oral BID   amLODipine  5 mg Oral QHS   docusate sodium  100 mg Oral BID   fluticasone furoate-vilanterol  1 puff Inhalation Daily   gabapentin  100 mg Oral TID   medroxyPROGESTERone  10 mg Oral Daily   metoprolol tartrate  50 mg Oral BID   nicotine  21 mg Transdermal Q24H   pantoprazole  40 mg Oral Daily   potassium chloride SA  20 mEq Oral BID   pravastatin  40 mg Oral Daily   sodium chloride flush  10 mL Intrapleural Q4H    Results for orders placed or performed during the hospital encounter of 10/05/22 (from the past 48 hour(s))  Basic metabolic panel     Status: Abnormal   Collection Time: 10/06/22  4:37 AM  Result Value Ref Range   Sodium 129 (L) 135 - 145 mmol/L   Potassium 3.1 (L) 3.5 - 5.1 mmol/L   Chloride 98 98 - 111 mmol/L   CO2 15 (L) 22 - 32 mmol/L   Glucose, Bld 124 (H) 70 - 99  mg/dL    Comment: Glucose reference range applies only to samples taken after fasting for at least 8 hours.   BUN 39 (H) 6 - 20 mg/dL   Creatinine, Ser 1.61 (H) 0.44 - 1.00 mg/dL   Calcium 8.7 (L) 8.9 - 10.3 mg/dL   GFR, Estimated 11 (L) >60 mL/min    Comment: (NOTE) Calculated using the CKD-EPI Creatinine Equation (2021)    Anion gap 16 (H) 5 - 15    Comment: Performed at [facility], [address] [place], [place] [address]  CBC     Status: Abnormal   Collection Time: 10/06/22  4:37 AM  Result Value Ref Range   WBC 6.9 4.0 - 10.5 K/uL   RBC 3.31 (L) 3.87 - 5.11 MIL/uL   Hemoglobin 11.0 (L) 12.0 - 15.0 g/dL   HCT 54.0 (L) 98.1 - 19.1 %   MCV 103.0 (H) 80.0 - 100.0 fL   MCH 33.2 26.0 -  34.0 pg   MCHC 32.3 30.0 - 36.0 g/dL   RDW 47.8 (H) 29.5 - 62.1 %   Platelets 147 (L) 150 - 400 K/uL   nRBC 0.0 0.0 - 0.2 %    Comment: Performed at [facility], [address] [place], [place] [address]  HIV Antibody (routine testing w rflx)     Status: None   Collection Time: 10/06/22  4:37 AM  Result Value Ref Range   HIV Screen 4th Generation wRfx Non Reactive Non Reactive    Comment: Performed at [facility], [address] [place], [place] [address]  Culture, blood (Routine X 2) w Reflex to ID Panel     Status: None (Preliminary result)   Collection Time: 10/06/22  8:26 AM   Specimen: BLOOD  Result Value Ref Range   Specimen Description      BLOOD BLOOD LEFT HAND Performed at [facility], [address], [place], [place] [address]    Special Requests      BOTTLES DRAWN AEROBIC ONLY Blood Culture adequate volume Performed at [facility], [address], [place], [place] [address]    Culture      NO GROWTH < 24 HOURS Performed at [facility], [address] [place], [place] [address]    Report Status PENDING   Culture, blood (Routine X 2) w Reflex to ID Panel     Status: None (Preliminary result)   Collection Time: 10/06/22  8:26 AM   Specimen: BLOOD  Result Value Ref Range   Specimen Description      BLOOD BLOOD LEFT ARM Performed at [facility], [address], [place], [place] [address]    Special Requests      BOTTLES DRAWN AEROBIC AND ANAEROBIC Blood Culture adequate volume Performed at [facility], [address], [place], [place] [address]    Culture      NO GROWTH < 24 HOURS Performed at [facility], [address], [place], [place] [address]    Report Status PENDING   Expectorated Sputum Assessment w Gram Stain, Rflx to Resp Cult     Status: None   Collection Time: 10/06/22  8:50 AM   Specimen: Sputum  Result Value Ref Range    Specimen Description SPUTUM    Special Requests NONE    Sputum evaluation      THIS SPECIMEN IS ACCEPTABLE FOR SPUTUM CULTURE Performed at [name], [address] [place], [place] [address]    Report Status 10/06/2022 FINAL   Strep pneumoniae urinary antigen     Status: None   Collection Time: 10/06/22  8:50 AM  Result Value Ref Range   Strep Pneumo Urinary Antigen NEGATIVE NEGATIVE    Comment: Performed at [facility], [address], [place], [place] [address]  Urinalysis, Routine w reflex microscopic -Urine, Clean Catch     Status: Abnormal   Collection Time: 10/06/22  8:50 AM  Result Value Ref Range   Color, Urine YELLOW YELLOW   APPearance CLEAR CLEAR   Specific Gravity, Urine 1.004 (L) 1.005 - 1.030   pH 6.0 5.0 - 8.0   Glucose, UA NEGATIVE NEGATIVE mg/dL   Hgb urine dipstick MODERATE (A) NEGATIVE   Bilirubin Urine NEGATIVE NEGATIVE   Ketones, ur NEGATIVE NEGATIVE mg/dL   Protein, ur 30 (A) NEGATIVE mg/dL   Nitrite NEGATIVE NEGATIVE   Leukocytes,Ua NEGATIVE NEGATIVE   RBC / HPF 0-5 0 - 5 RBC/hpf   WBC, UA 0-5 0 - 5 WBC/hpf   Bacteria, UA RARE (A) NONE SEEN   Squamous Epithelial / HPF 0-5 0 - 5 /HPF   Mucus PRESENT     Comment: Performed at [facility], [address], [place], [place] [address]  Culture, Respiratory w Gram Stain     Status: None (Preliminary result)   Collection Time: 10/06/22  8:50 AM   Specimen: SPU  Result Value Ref Range   Specimen Description      SPUTUM Performed at [facility], [address] [place], [place] [address]    Special Requests      NONE Reflexed from [id number] Performed at [facility], [address] [place], [place] [address]    Gram Stain      RARE WBC PRESENT,BOTH PMN AND MONONUCLEAR MODERATE GRAM POSITIVE COCCI IN PAIRS FEW GRAM NEGATIVE RODS    Culture      CULTURE REINCUBATED FOR BETTER GROWTH Performed at [facility], [address], [place], [place] [address]    Report Status PENDING   Creatinine, urine, random     Status: None   Collection Time: 10/06/22  8:50 AM  Result Value Ref Range   Creatinine, Urine 25 mg/dL    Comment: Performed at [facility], [address] [place], [place] [address]  Sodium, urine, random     Status: None   Collection Time: 10/06/22  8:50 AM  Result Value Ref Range   Sodium, Ur 20 mmol/L    Comment: Performed at [facility], [address] [place], [place] [address]  Renal function panel     Status: Abnormal   Collection Time: 10/07/22  4:22 AM  Result Value Ref Range   Sodium 133 (L) 135 - 145 mmol/L   Potassium 4.1 3.5 - 5.1 mmol/L   Chloride 105 98 - 111 mmol/L   CO2 15 (L) 22 - 32 mmol/L   Glucose, Bld 98 70 - 99 mg/dL    Comment: Glucose reference range applies only to samples taken after fasting for at least 8 hours.   BUN 44 (H) 6 - 20 mg/dL   Creatinine, Ser 3.23 (H) 0.44 - 1.00 mg/dL   Calcium 8.2 (L) 8.9 - 10.3 mg/dL   Phosphorus 4.7 (H) 2.5 - 4.6 mg/dL   Albumin 2.6 (L) 3.5 - 5.0 g/dL   GFR, Estimated 11 (L) >60 mL/min  Comment: (NOTE) Calculated using the CKD-EPI Creatinine Equation (2021)    Anion gap 13 5 - 15    Comment: Performed at [facility], [address], [place], [place] [address]    CT CHEST WO CONTRAST  Result Date: 10/07/2022 CLINICAL DATA:  Chronic dyspnea.  Loculated pleural effusion. EXAM: CT CHEST WITHOUT CONTRAST TECHNIQUE: Multidetector CT imaging of the chest was performed following the standard protocol without IV contrast. RADIATION DOSE REDUCTION: This exam was performed according to the departmental dose-optimization program which includes automated exposure control, adjustment of the mA and/or kV according to patient size and/or use of iterative reconstruction technique. COMPARISON:  October 05, 2022. FINDINGS: Cardiovascular: No significant vascular findings.  Normal heart size. No pericardial effusion. Mediastinum/Nodes: Thyroid gland is not well visualized. Esophagus is unremarkable. Stable right paratracheal adenopathy is noted which most likely is inflammatory in etiology. Lungs/Pleura: There is been interval placement of pigtail pleural drainage catheter into loculated right pleural effusion. The effusion is not significantly changed in size. Continued opacity seen in right lower lobe and upper lobe consistent with pneumonia or atelectasis. Air bronchograms are noted. No pneumothorax is noted. Minimal left lingular subsegmental atelectasis or scarring is noted. New opacity is noted anteriorly in left upper lobe concerning for pneumonia. Upper Abdomen: No acute abnormality. Musculoskeletal: Status post T12 kyphoplasty. Stable lytic and sclerotic densities throughout visualized skeleton consistent with history of multiple myeloma. IMPRESSION: Interval placement of pigtail drainage catheter into loculated right pleural effusion which is not significantly changed in size. Continued right upper and lower lobe opacities are noted consistent with pneumonia or atelectasis. New opacity is noted anteriorly in left upper lobe concerning for pneumonia. Stable osseous lesions consistent with history of multiple myeloma. Electronically Signed   By: Lupita Raider M.D.   On: 10/07/2022 15:11   DG Chest Port 1 View  Result Date: 10/07/2022 CLINICAL DATA:  Chest tube surveillance EXAM: PORTABLE CHEST 1 VIEW COMPARISON:  10/06/2022 FINDINGS: Right basilar chest tube remains in place. Configuration of the chest tube raises the possibility for tube kinking. Moderate-large right pleural effusion is similar in size to prior. Associated right basilar opacity. No pneumothorax. Heart size is normal. Left lung is clear. IMPRESSION: Moderate-large right pleural effusion, similar in size to prior. Right basilar chest tube remains in place. Configuration of the chest tube raises the  possibility for tube kinking. No pneumothorax. Electronically Signed   By: Duanne Guess D.O.   On: 10/07/2022 11:30   US RENAL  Result Date: 10/06/2022 CLINICAL DATA:  Acute kidney injury. EXAM: RENAL / URINARY TRACT ULTRASOUND COMPLETE COMPARISON:  Limited right upper quadrant abdomen ultrasound dated 10/05/2022. Chest abdomen and pelvis CT dated 04/02/2020. Nuclear medicine PET-CT dated 04/24/2020 multiple myeloma. FINDINGS: Right Kidney: Renal measurements: 11.8 x 7.2 x 5 4 cm = volume: 142 mL. Echogenicity within normal limits. No mass or hydronephrosis visualized. Left Kidney: Renal measurements: 12.0 x 6.4 x 6.4 cm = volume: 161 mL. Echogenicity within normal limits. No mass or hydronephrosis visualized. Bladder: Appears normal for degree of bladder distention. Other: Stable diffusely echogenic liver with no corresponding CT abnormality. IMPRESSION: 1. Normal renal ultrasound. 2. Stable diffusely echogenic liver. This can be seen with steatosis, cirrhosis and chronic hepatitis. Electronically Signed   By: Beckie Salts M.D.   On: 10/06/2022 16:34   DG CHEST PORT 1 VIEW  Result Date: 10/06/2022 CLINICAL DATA:  Chest tube placement. EXAM: PORTABLE CHEST 1 VIEW COMPARISON:  October 05, 2022 FINDINGS: Right-sided chest  tube has been placed. No evidence of pneumothorax. Similar opacification of most of the right lung. The left lung is clear. IMPRESSION: 1. Right-sided chest tube has been placed. No evidence of pneumothorax. 2. Similar opacification of most of the right lung. Electronically Signed   By: Ted Mcalpine M.D.   On: 10/06/2022 12:17    I personally reviewed the CT and CXR images.  Right lung consolidation with loculated effusion. Catheter in good position. Multiple lytic rib lesions.  Review of Systems Blood pressure 137/74, pulse (!) 105, temperature 98.6 F (37 C), temperature source Oral, resp. rate 18, height 5' 3.25" (1.607 m), weight 117.9 kg, SpO2 92%. Physical Exam Vitals  reviewed.  Constitutional:      Appearance: She is obese. She is ill-appearing.  HENT:     Head: Normocephalic and atraumatic.  Eyes:     Extraocular Movements: Extraocular movements intact.  Cardiovascular:     Rate and Rhythm: Tachycardia present.     Heart sounds: Normal heart sounds.  Pulmonary:     Effort: Tachypnea present.     Comments: Absent BS right base. Catheter in place. Skin:    General: Skin is warm and dry.  Neurological:     General: No focal deficit present.     Mental Status: She is oriented to person, place, and time.     Cranial Nerves: No cranial nerve deficit.     Motor: No weakness.     Assessment/Plan: Stacey Montoya is a 47 yo woman with hx of multiple myeloma, hypertension, hyperlipidemia, obesity, and DVT.  Presented with pneumonia complicated by parapneumonic effusion.  She has a catheter in place and there is minimal downside to attempting thrombolytics via the tube.  She had thrombolytics instilled earlier this afternoon but hasn't had much drainage.  Often requires more than 1 dose to achieve effect.  The tube is functional as it does flush and fluid can be aspirated.  Sputum culture with GPC in pairs and GNR on gram stain.  She is on appropriate antibiotic coverage with ceftriaxone and doxycycline.   If thrombolytics are unsuccessful will need VATS to drain the effusion and decorticate the lung. Would be relatively high risk with obesity, hx of DVT and acute kidney injury.  I discussed with the patient the general nature of the procedure, including the need for general anesthesia, the incisions to be used, and the use of drainage tubes postoperatively with Stacey Montoya and her husband.  We discussed the expected hospital stay, overall recovery and short and long term outcomes. I informed them of the indications, risks, benefits and alternatives.  They understand the risks include, but are not limited to death, stroke, MI, DVT/PE, bleeding, possible need  for transfusion, infections, air leaks, and other organ system dysfunction including respiratory, renal, or GI complications.   Will follow  Loreli Slot 10/07/2022, 6:47 PM

## 2022-10-07 NOTE — Progress Notes (Signed)
PROGRESS NOTE    Stacey Montoya  OZH:[id number] DOB: 1975-04-22 DOA: 10/05/2022 PCP: Benita Stabile, MD    Brief Narrative:  47 year old with history of hypertension, hyperlipidemia, multiple myeloma status postchemotherapy now in remission, DVT on Eliquis presented to the emergency room with 3 days of worsening shortness of breath and she was found to have right-sided upper lobe pulmonary consolidation, loculated pleural effusion.  Also had cough but no fever.  Right-sided chest pressure sensation.  In the emergency room initially hypoxic and placed on 3 L oxygen.  Afebrile.  Blood pressure is stable.  Sodium 128, potassium 3.2, creatinine 4.24 from baseline about 1.2.  Mildly elevated transaminases.  IR attempted thoracentesis, only removed about 100 mL.  Pulmonary consulted for chest tube placement and lytics.   Assessment & Plan:   Right-sided loculated pleural effusion, parapneumonic versus malignant effusion.  Also with associated consolidation.  Hypoxia. currently needing 3 L of oxygen.  No respiratory distress. Continuing on IV rocephin and IV doxycycline. IR guided aspiration, transudate with 94 neutrophils.  Going multiple organisms. Likely transudate.  Cytology pending. Neutrophils 94.  Right-sided chest tube placement, irrigation and lytics as per pulmonary. Continue with chest physiotherapy, incentive spirometry, deep breathing exercises, sputum induction, mucolytic's and bronchodilators. Sputum cultures, blood cultures. Supplemental oxygen to keep saturations more than 90%.  Mobilize in the hallway.  Continue to hold Eliquis until procedures are completed.  Acute kidney injury: Recent baseline creatinine of 1.2.  Presented with creatinine more than 4.   Probably prerenal.   Renal ultrasound essentially normal.   Creatinine plateauing at about 4.6.  Will continue to monitor.   On maintenance bicarbonate after isotonic fluid resuscitation.  Nephrology following.     Hypokalemia: Replaced and adequate.  Multiple myeloma, currently on Revlimid.  On hold until outpatient follow-up.  Abnormal LFTs: Acute hepatitis panel negative.  Right upper quadrant ultrasound shows hepatic steatosis.  Will recheck LFTs to ensure stabilization.  History of DVT: Remains on Eliquis.  On hold for procedures and to avoid risk of bleeding.    DVT prophylaxis: SCDs Start: 10/05/22 1639   Code Status: Full code Family Communication: None at the bedside Disposition Plan: Status is: Inpatient Remains inpatient appropriate because: IV antibiotics, inpatient procedures     Consultants:  Pulmonary Nephrology  Procedures:  Chest tube placement  Antimicrobials:  Doxycycline and Rocephin 8/19--   Subjective: Patient seen and examined.  Still has significant pain on deep breathing.  Chest tube insertion site hurts.  Urinating normally.  Denies any nausea or vomiting.   Objective: Vitals:   10/07/22 0841 10/07/22 0850 10/07/22 1039 10/07/22 1341  BP:   125/71 137/74  Pulse:   96 (!) 105  Resp:      Temp:    98.6 F (37 C)  TempSrc:    Oral  SpO2: 97% 97%  91%  Weight:      Height:        Intake/Output Summary (Last 24 hours) at 10/07/2022 1421 Last data filed at 10/07/2022 1343 Gross per 24 hour  Intake 1935.22 ml  Output 6134 ml  Net -4198.78 ml   Filed Weights   10/05/22 1049  Weight: 117.9 kg    Examination:  General exam: Appears fairly comfortable.  On 3 L oxygen.   Respiratory system: Mostly conducted upper airway sounds.  On 2 to 3 L of oxygen.  Poor inspiratory effort. Cardiovascular system: S1 & S2 heard, RRR.  Gastrointestinal system: Abdomen is nondistended, soft and nontender. No  organomegaly or masses felt. Normal bowel sounds heard. Central nervous system: Alert and oriented. No focal neurological deficits. Extremities: Symmetric 5 x 5 power.    Data Reviewed: I have personally reviewed following labs and imaging  studies  CBC: Recent Labs  Lab 10/05/22 1130 10/06/22 0437  WBC 6.7 6.9  NEUTROABS 5.3  --   HGB 11.6* 11.0*  HCT 35.2* 34.1*  MCV 102.0* 103.0*  PLT 154 147*   Basic Metabolic Panel: Recent Labs  Lab 10/05/22 1130 10/06/22 0437 10/07/22 0422  NA 128* 129* 133*  K 3.2* 3.1* 4.1  CL 96* 98 105  CO2 17* 15* 15*  GLUCOSE 120* 124* 98  BUN 33* 39* 44*  CREATININE 4.24* 4.75* 4.68*  CALCIUM 8.8* 8.7* 8.2*  PHOS  --   --  4.7*   GFR: Estimated Creatinine Clearance: 18.9 mL/min (A) (by C-G formula based on SCr of 4.68 mg/dL (H)). Liver Function Tests: Recent Labs  Lab 10/05/22 1130 10/07/22 0422  AST 78*  --   ALT 171*  --   ALKPHOS 135*  --   BILITOT 1.3*  --   PROT 7.3  --   ALBUMIN 3.1* 2.6*   No results for input(s): "LIPASE", "AMYLASE" in the last 168 hours. No results for input(s): "AMMONIA" in the last 168 hours. Coagulation Profile: No results for input(s): "INR", "PROTIME" in the last 168 hours. Cardiac Enzymes: No results for input(s): "CKTOTAL", "CKMB", "CKMBINDEX", "TROPONINI" in the last 168 hours. BNP (last 3 results) No results for input(s): "PROBNP" in the last 8760 hours. HbA1C: No results for input(s): "HGBA1C" in the last 72 hours. CBG: No results for input(s): "GLUCAP" in the last 168 hours. Lipid Profile: No results for input(s): "CHOL", "HDL", "LDLCALC", "TRIG", "CHOLHDL", "LDLDIRECT" in the last 72 hours. Thyroid Function Tests: No results for input(s): "TSH", "T4TOTAL", "FREET4", "T3FREE", "THYROIDAB" in the last 72 hours. Anemia Panel: No results for input(s): "VITAMINB12", "FOLATE", "FERRITIN", "TIBC", "IRON", "RETICCTPCT" in the last 72 hours. Sepsis Labs: No results for input(s): "PROCALCITON", "LATICACIDVEN" in the last 168 hours.  Recent Results (from the past 240 hour(s))  Resp panel by RT-PCR (RSV, Flu A&B, Covid) Anterior Nasal Swab     Status: None   Collection Time: 10/05/22 11:18 AM   Specimen: Anterior Nasal Swab   Result Value Ref Range Status   SARS Coronavirus 2 by RT PCR NEGATIVE NEGATIVE Final    Comment: (NOTE) SARS-CoV-2 target nucleic acids are NOT DETECTED.  The SARS-CoV-2 RNA is generally detectable in upper respiratory specimens during the acute phase of infection. The lowest concentration of SARS-CoV-2 viral copies this assay can detect is 138 copies/mL. A negative result does not preclude SARS-Cov-2 infection and should not be used as the sole basis for treatment or other patient management decisions. A negative result may occur with  improper specimen collection/handling, submission of specimen other than nasopharyngeal swab, presence of viral mutation(s) within the areas targeted by this assay, and inadequate number of viral copies(<138 copies/mL). A negative result must be combined with clinical observations, patient history, and epidemiological information. The expected result is Negative.  Fact Sheet for Patients:  BloggerCourse.com  Fact Sheet for Healthcare Providers:  SeriousBroker.it  This test is no t yet approved or cleared by the [place] FDA and  has been authorized for detection and/or diagnosis of SARS-CoV-2 by FDA under an Emergency Use Authorization (EUA). This EUA will remain  in effect (meaning this test can be used) for the duration of the COVID-19  declaration under Section 564(b)(1) of the Act, 21 U.S.C.section 360bbb-3(b)(1), unless the authorization is terminated  or revoked sooner.       Influenza A by PCR NEGATIVE NEGATIVE Final   Influenza B by PCR NEGATIVE NEGATIVE Final    Comment: (NOTE) The Xpert Xpress SARS-CoV-2/FLU/RSV plus assay is intended as an aid in the diagnosis of influenza from Nasopharyngeal swab specimens and should not be used as a sole basis for treatment. Nasal washings and aspirates are unacceptable for Xpert Xpress SARS-CoV-2/FLU/RSV testing.  Fact Sheet for  Patients: BloggerCourse.com  Fact Sheet for Healthcare Providers: SeriousBroker.it  This test is not yet approved or cleared by the [place] FDA and has been authorized for detection and/or diagnosis of SARS-CoV-2 by FDA under an Emergency Use Authorization (EUA). This EUA will remain in effect (meaning this test can be used) for the duration of the COVID-19 declaration under Section 564(b)(1) of the Act, 21 U.S.C. section 360bbb-3(b)(1), unless the authorization is terminated or revoked.     Resp Syncytial Virus by PCR NEGATIVE NEGATIVE Final    Comment: (NOTE) Fact Sheet for Patients: BloggerCourse.com  Fact Sheet for Healthcare Providers: SeriousBroker.it  This test is not yet approved or cleared by the [place] FDA and has been authorized for detection and/or diagnosis of SARS-CoV-2 by FDA under an Emergency Use Authorization (EUA). This EUA will remain in effect (meaning this test can be used) for the duration of the COVID-19 declaration under Section 564(b)(1) of the Act, 21 U.S.C. section 360bbb-3(b)(1), unless the authorization is terminated or revoked.  Performed at [facility], [address] [place], [place] [address]   Culture, blood (Routine X 2) w Reflex to ID Panel     Status: None (Preliminary result)   Collection Time: 10/06/22  8:26 AM   Specimen: BLOOD  Result Value Ref Range Status   Specimen Description   Final    BLOOD BLOOD LEFT HAND Performed at [facility], [address], [place], [place] [address]    Special Requests   Final    BOTTLES DRAWN AEROBIC ONLY Blood Culture adequate volume Performed at [facility], [address] [place], [place] [address]    Culture   Final    NO GROWTH < 24 HOURS Performed at [facility], [address], [place], [place] [address]     Report Status PENDING  Incomplete  Culture, blood (Routine X 2) w Reflex to ID Panel     Status: None (Preliminary result)   Collection Time: 10/06/22  8:26 AM   Specimen: BLOOD  Result Value Ref Range Status   Specimen Description   Final    BLOOD BLOOD LEFT ARM Performed at [facility], [address], [place], [place] [address]    Special Requests   Final    BOTTLES DRAWN AEROBIC AND ANAEROBIC Blood Culture adequate volume Performed at [facility], [address], [place], [place] [address]    Culture   Final    NO GROWTH < 24 HOURS Performed at [facility], [address] [place], [place] [address]    Report Status PENDING  Incomplete  Expectorated Sputum Assessment w Gram Stain, Rflx to Resp Cult     Status: None   Collection Time: 10/06/22  8:50 AM   Specimen: Sputum  Result Value Ref Range Status   Specimen Description SPUTUM  Final   Special Requests NONE  Final   Sputum  evaluation   Final    THIS SPECIMEN IS ACCEPTABLE FOR SPUTUM CULTURE Performed at [facility], [address] [place], [place] [address]    Report Status 10/06/2022 FINAL  Final  Culture, Respiratory w Gram Stain     Status: None (Preliminary result)   Collection Time: 10/06/22  8:50 AM   Specimen: SPU  Result Value Ref Range Status   Specimen Description   Final    SPUTUM Performed at [facility], [address] [place], [place] [address]    Special Requests   Final    NONE Reflexed from [id number] Performed at [facility], [address], [place], [place] [address]    Gram Stain   Final    RARE WBC PRESENT,BOTH PMN AND MONONUCLEAR MODERATE GRAM POSITIVE COCCI IN PAIRS FEW GRAM NEGATIVE RODS    Culture   Final    CULTURE REINCUBATED FOR BETTER GROWTH Performed at [facility], [address], [place], [place] [address]    Report Status PENDING  Incomplete         Radiology  Studies: DG Chest Port 1 View  Result Date: 10/07/2022 CLINICAL DATA:  Chest tube surveillance EXAM: PORTABLE CHEST 1 VIEW COMPARISON:  10/06/2022 FINDINGS: Right basilar chest tube remains in place. Configuration of the chest tube raises the possibility for tube kinking. Moderate-large right pleural effusion is similar in size to prior. Associated right basilar opacity. No pneumothorax. Heart size is normal. Left lung is clear. IMPRESSION: Moderate-large right pleural effusion, similar in size to prior. Right basilar chest tube remains in place. Configuration of the chest tube raises the possibility for tube kinking. No pneumothorax. Electronically Signed   By: Duanne Guess D.O.   On: 10/07/2022 11:30   US RENAL  Result Date: 10/06/2022 CLINICAL DATA:  Acute kidney injury. EXAM: RENAL / URINARY TRACT ULTRASOUND COMPLETE COMPARISON:  Limited right upper quadrant abdomen ultrasound dated 10/05/2022. Chest abdomen and pelvis CT dated 04/02/2020. Nuclear medicine PET-CT dated 04/24/2020 multiple myeloma. FINDINGS: Right Kidney: Renal measurements: 11.8 x 7.2 x 5 4 cm = volume: 142 mL. Echogenicity within normal limits. No mass or hydronephrosis visualized. Left Kidney: Renal measurements: 12.0 x 6.4 x 6.4 cm = volume: 161 mL. Echogenicity within normal limits. No mass or hydronephrosis visualized. Bladder: Appears normal for degree of bladder distention. Other: Stable diffusely echogenic liver with no corresponding CT abnormality. IMPRESSION: 1. Normal renal ultrasound. 2. Stable diffusely echogenic liver. This can be seen with steatosis, cirrhosis and chronic hepatitis. Electronically Signed   By: Beckie Salts M.D.   On: 10/06/2022 16:34   DG CHEST PORT 1 VIEW  Result Date: 10/06/2022 CLINICAL DATA:  Chest tube placement. EXAM: PORTABLE CHEST 1 VIEW COMPARISON:  October 05, 2022 FINDINGS: Right-sided chest tube has been placed. No evidence of pneumothorax. Similar opacification of most of the right lung.  The left lung is clear. IMPRESSION: 1. Right-sided chest tube has been placed. No evidence of pneumothorax. 2. Similar opacification of most of the right lung. Electronically Signed   By: Ted Mcalpine M.D.   On: 10/06/2022 12:17   US Abdomen Limited RUQ (LIVER/GB)  Result Date: 10/05/2022 CLINICAL DATA:  Elevated LFTs EXAM: ULTRASOUND ABDOMEN LIMITED RIGHT UPPER QUADRANT COMPARISON:  PET/CT 04/24/2020 FINDINGS: Gallbladder: No gallstones or wall thickening visualized. No sonographic Murphy sign noted by sonographer. Common bile duct: Diameter: 3 mm Liver: No focal lesion identified. Increased parenchymal echogenicity with coarse echotexture. Portal vein is patent on  color Doppler imaging with normal direction of blood flow towards the liver. Other: Exam is compromised by body habitus and bowel gas. A right pleural effusion is noted. IMPRESSION: 1. Hepatic steatosis. 2. Partially visualized right pleural effusion. Electronically Signed   By: Minerva Fester M.D.   On: 10/05/2022 18:58   US THORACENTESIS ASP PLEURAL SPACE W/IMG GUIDE  Result Date: 10/05/2022 INDICATION: Patient with history of multiple myeloma in remission; now with cough, dyspnea, right pleural effusion. Request received for diagnostic and therapeutic right thoracentesis EXAM: ULTRASOUND GUIDED DIAGNOSTIC AND THERAPEUTIC RIGHT THORACENTESIS MEDICATIONS: 8 mL of 1% lidocaine COMPLICATIONS: None immediate. PROCEDURE: An ultrasound guided thoracentesis was thoroughly discussed with the patient and questions answered. The benefits, risks, alternatives and complications were also discussed. The patient understands and wishes to proceed with the procedure. Written consent was obtained. Ultrasound was performed to localize and mark an adequate pocket of fluid in the right chest. The area was then prepped and draped in the normal sterile fashion. 1% Lidocaine was used for local anesthesia. Under ultrasound guidance a 6 Fr Safe-T-Centesis  catheter was introduced. Thoracentesis was performed. The catheter was removed and a dressing applied. FINDINGS: A total of approximately 115 cc of yellow fluid was removed. Samples were sent to the laboratory as requested by the clinical team. The pleural fluid collection was multiloculated and only the above amount could be aspirated at this time. IMPRESSION: Successful ultrasound guided diagnostic and therapeutic RIGHT thoracentesis yielding 115 mL of pleural fluid. Performed by: Artemio Aly Electronically Signed   By: Roanna Banning M.D.   On: 10/05/2022 17:05   DG Chest 1 View  Result Date: 10/05/2022 CLINICAL DATA:  [id number] S/P thoracentesis [id number] EXAM: PORTABLE CHEST 1 VIEW COMPARISON:  CT chest, 10/05/2022.  IR ultrasound, earlier same day. FINDINGS: Cardiac silhouette is within normal limits. Persistently obscured RIGHT cardiac border. The LEFT lung is clear. Similar dense consolidation of the RIGHT lower lobe. No pneumothorax. No interval osseous abnormality. IMPRESSION: 1. No pneumothorax post RIGHT thoracentesis 2. Persistent dense consolidation of the RIGHT lower lobe. Consider 4 weeks chest XR follow-up after treatment to ensure resolution Electronically Signed   By: Roanna Banning M.D.   On: 10/05/2022 16:59        Scheduled Meds:  acyclovir  400 mg Oral BID   alteplase (CATHFLO ACTIVASE) 10 mg in sodium chloride (PF) 0.9 % 30 mL  10 mg Intrapleural Once   And   dornase alfa (PULMOZYME) 5 mg in sterile water (preservative free) 30 mL  5 mg Intrapleural Once   amLODipine  5 mg Oral QHS   fluticasone furoate-vilanterol  1 puff Inhalation Daily   gabapentin  100 mg Oral TID   medroxyPROGESTERone  10 mg Oral Daily   metoprolol tartrate  50 mg Oral BID   nicotine  21 mg Transdermal Q24H   pantoprazole  40 mg Oral Daily   potassium chloride SA  20 mEq Oral BID   pravastatin  40 mg Oral Daily   sodium chloride flush  10 mL Intrapleural Q8H   sodium chloride flush  10 mL  Intrapleural Q8H   Continuous Infusions:  cefTRIAXone (ROCEPHIN)  IV 2 g (10/06/22 1724)   doxycycline (VIBRAMYCIN) IV 100 mg (10/06/22 1751)   sodium bicarbonate 150 mEq in sterile water 1,150 mL infusion 125 mL/hr at 10/07/22 1044     LOS: 2 days    Time spent: 40 minutes    Dorcas Carrow, MD [facility]

## 2022-10-07 NOTE — Progress Notes (Signed)
Called to patients room to evaluate SP02.  Pts SP02 was 83-86% placed her on a HFNC salter @ 10 lpm with little improvement.  Pt is a mouth breather placed her on 50% venti mask pts SP02 is now 90-93%.  RN aware.  RT will continue to monitor and wean oxygen as tolerated.

## 2022-10-08 ENCOUNTER — Inpatient Hospital Stay (HOSPITAL_COMMUNITY): Payer: 59

## 2022-10-08 DIAGNOSIS — J90 Pleural effusion, not elsewhere classified: Secondary | ICD-10-CM | POA: Diagnosis not present

## 2022-10-08 LAB — CBC
HCT: 39.1 % (ref 36.0–46.0)
HCT: 36.5 % (ref 36.0–46.0)
Hemoglobin: 12.8 g/dL (ref 12.0–15.0)
Hemoglobin: 12.2 g/dL (ref 12.0–15.0)
MCH: 33.5 pg (ref 26.0–34.0)
MCH: 32.8 pg (ref 26.0–34.0)
MCHC: 32.7 g/dL (ref 30.0–36.0)
MCHC: 33.4 g/dL (ref 30.0–36.0)
MCV: 102.4 fL — ABNORMAL HIGH (ref 80.0–100.0)
MCV: 98.1 fL (ref 80.0–100.0)
Platelets: 148 10*3/uL — ABNORMAL LOW (ref 150–400)
Platelets: 174 10*3/uL (ref 150–400)
RBC: 3.82 MIL/uL — ABNORMAL LOW (ref 3.87–5.11)
RBC: 3.72 MIL/uL — ABNORMAL LOW (ref 3.87–5.11)
RDW: 16.9 % — ABNORMAL HIGH (ref 11.5–15.5)
RDW: 16.7 % — ABNORMAL HIGH (ref 11.5–15.5)
WBC: 7.8 10*3/uL (ref 4.0–10.5)
WBC: 9.2 10*3/uL (ref 4.0–10.5)
nRBC: 0 % (ref 0.0–0.2)
nRBC: 0 % (ref 0.0–0.2)

## 2022-10-08 LAB — CULTURE, RESPIRATORY W GRAM STAIN: Culture: NORMAL

## 2022-10-08 LAB — COMPREHENSIVE METABOLIC PANEL
ALT: 132 U/L — ABNORMAL HIGH (ref 0–44)
AST: 54 U/L — ABNORMAL HIGH (ref 15–41)
Albumin: 2.6 g/dL — ABNORMAL LOW (ref 3.5–5.0)
Alkaline Phosphatase: 190 U/L — ABNORMAL HIGH (ref 38–126)
Anion gap: 15 (ref 5–15)
BUN: 48 mg/dL — ABNORMAL HIGH (ref 6–20)
CO2: 19 mmol/L — ABNORMAL LOW (ref 22–32)
Calcium: 7.8 mg/dL — ABNORMAL LOW (ref 8.9–10.3)
Chloride: 97 mmol/L — ABNORMAL LOW (ref 98–111)
Creatinine, Ser: 4.78 mg/dL — ABNORMAL HIGH (ref 0.44–1.00)
GFR, Estimated: 11 mL/min — ABNORMAL LOW (ref >60)
Glucose, Bld: 142 mg/dL — ABNORMAL HIGH (ref 70–99)
Potassium: 3.4 mmol/L — ABNORMAL LOW (ref 3.5–5.1)
Sodium: 131 mmol/L — ABNORMAL LOW (ref 135–145)
Total Bilirubin: 1.4 mg/dL — ABNORMAL HIGH (ref 0.3–1.2)
Total Protein: 6.2 g/dL — ABNORMAL LOW (ref 6.5–8.1)

## 2022-10-08 LAB — LEGIONELLA PNEUMOPHILA SEROGP 1 UR AG: L. pneumophila Serogp 1 Ur Ag: NEGATIVE

## 2022-10-08 LAB — CYTOLOGY - NON PAP

## 2022-10-08 MED ORDER — METHOCARBAMOL 1000 MG/10ML IJ SOLN
500.0000 mg | Freq: Three times a day (TID) | INTRAVENOUS | Status: DC
  Administered 2022-10-08 – 2022-10-11 (×8): 500 mg via INTRAVENOUS
  Filled 2022-10-08: qty 5
  Filled 2022-10-08: qty 500
  Filled 2022-10-08: qty 5
  Filled 2022-10-08: qty 500
  Filled 2022-10-08 (×2): qty 5
  Filled 2022-10-08 (×3): qty 500
  Filled 2022-10-08: qty 5
  Filled 2022-10-08: qty 500

## 2022-10-08 MED ORDER — SODIUM CHLORIDE (PF) 0.9 % IJ SOLN
10.0000 mg | Freq: Once | INTRAMUSCULAR | Status: AC
  Administered 2022-10-08: 10 mg via INTRAPLEURAL
  Filled 2022-10-08: qty 10

## 2022-10-08 MED ORDER — STERILE WATER FOR INJECTION IJ SOLN
5.0000 mg | Freq: Once | RESPIRATORY_TRACT | Status: AC
  Administered 2022-10-08: 5 mg via INTRAPLEURAL
  Filled 2022-10-08: qty 5

## 2022-10-08 MED ORDER — METOPROLOL TARTRATE 12.5 MG HALF TABLET
12.5000 mg | ORAL_TABLET | Freq: Two times a day (BID) | ORAL | Status: DC
  Administered 2022-10-08 – 2022-10-12 (×8): 12.5 mg via ORAL
  Filled 2022-10-08 (×9): qty 1

## 2022-10-08 MED ORDER — KETOROLAC TROMETHAMINE 30 MG/ML IJ SOLN: 30.0000 mg | Freq: Four times a day (QID) | INTRAMUSCULAR | Status: DC

## 2022-10-08 MED ORDER — KETOROLAC TROMETHAMINE 30 MG/ML IJ SOLN
15.0000 mg | Freq: Four times a day (QID) | INTRAMUSCULAR | Status: DC
  Administered 2022-10-08 – 2022-10-09 (×2): 15 mg via INTRAVENOUS
  Filled 2022-10-08 (×2): qty 1

## 2022-10-08 NOTE — Procedures (Signed)
Pleural Fibrinolytic Administration Procedure Note  Stacey Montoya  [id number]  April 27, 1975  Date:10/08/22  Time:4:06 PM   Provider Performing:Gwenivere Hiraldo C Katrinka Blazing   Procedure: Pleural Fibrinolysis Subsequent day [id number]  Indication(s) Fibrinolysis of complicated pleural effusion  Consent Risks of the procedure as well as the alternatives and risks of each were explained to the patient and/or caregiver.  Consent for the procedure was obtained.   Anesthesia None   Time Out Verified patient identification, verified procedure, site/side was marked, verified correct patient position, special equipment/implants available, medications/allergies/relevant history reviewed, required imaging and test results available.   Sterile Technique Hand hygiene, gloves   Procedure Description Existing pleural catheter was cleaned and accessed in sterile manner.  10mg  of tPA in 30cc of saline and 5mg  of dornase in 30cc of sterile water were injected into pleural space using existing pleural catheter.  Catheter will be clamped for 1 hour and then placed back to suction.   Complications/Tolerance None; patient tolerated the procedure well.  EBL None   Specimen(s) None

## 2022-10-08 NOTE — Progress Notes (Addendum)
10/08/2022 Called for chest pain Lots of pleurisy after lytics Excellent drainage of serosanguinous output after latest dose of lytics Awaiting CXR Would do multimodal pain control: robaxin, gabapentin, toradol, PRN dilaudid. CBC and type/screen for completeness sake  Myrla Halsted MD PCCM  Addendum: CXR looks great!  Continue pleural lytics.  Multimodal pain control.

## 2022-10-08 NOTE — Progress Notes (Signed)
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  Marland Kitchen Last seen by Oncology 7/31. During that visit noted myeloma stable  and she is being managed on Revlimid and zometa EO month. Her disease is felt to be stable w/ the exception of prior hypercalcemia and recent AKI back in May 2024. Presents to ER 8/19 w/ cc right sided pleuritic chest pain, cough productive of thick brown sputum, wheezing and increased shortness of breath. Onset initially noted on 8/15 w/ fever > 101 full body aches and chills. No obvious sick exposures but was in contact w/ young child prior. In ER RVP neg, BNP 112, wbc 6.7. A CT of chest was obtained w/out contrast. This showed loculated right pleural effusion w/ associated RLL and RML airspace disease. Cultures were sent. She was started on empiric antibiotics and referred to interventional radiology where she underwent right sided thoracentesis. The procedure was stopped after only being able top  drain 115 ml of yellow pleural fluid. Review of US showed multiple septations. Initial pleural LDH 401. 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 ABX started. Right sided thoracentesis  exudate removed. Volume limited due to septations and loculation of pleural fluid. PCCM asked to eval  8/20 right sided small bore CT placed. Initially 90cc concentrated yellow pleural fluid.  8/21 S/p initial tpa/dnase  Interim History / Subjective:  Chest tube output 900 cc/24 hours Worsening hypoxemia. Wore NRB overnight and transitioned to 7L via [place] this morning. Suspect OSA however patient denies diagnosis  Objective   Blood pressure 122/85, pulse (!) 113, temperature 98.2 F (36.8 C), temperature source Oral, resp. rate (!) 22, height 5' 3.25" (1.607 m), weight 124.3 kg, SpO2 92%.    FiO2 (%):  [40 %-55 %] 50 %   Intake/Output Summary (Last 24 hours) at 10/08/2022 6213 Last data filed at 10/08/2022 0865 Gross per 24 hour  Intake 2236.24 ml  Output 4750 ml  Net -2513.76 ml   Filed Weights   10/05/22 1049 10/08/22 0500  Weight: 117.9 kg 124.3 kg    Physical Exam: General: Well-appearing, no acute distress HENT: [place], AT Eyes: EOMI, no scleral icterus Respiratory: Right posterior chest tube in place, unable to flush Extremities:-Edema,-tenderness Neuro: AAO x4, CNII-XII grossly intact Psych: Normal mood, normal affect  Physical Exam: General: Well-appearing, no acute distress HENT: [place], AT, OP clear, MMM Eyes: EOMI, no scleral icterus Respiratory: Right posterior chest tube in place, draining. Diminished right air entry. Normal left air entry Cardiovascular: RRR, -M/R/G, no JVD Extremities:-Edema,-tenderness Neuro: AAO x4, CNII-XII grossly intact Skin: Bruising with mild hematoma at chest tube insertion site, stable Psych: Normal mood, normal affect  CT Chest 10/05/22 Moderate-sized loculated right  pleural effusion with RML/RLL compression, patchy opacities on right side. 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 Continue to hold AC while on pleural lytics --S/p chest tube placement 8/20 --Chest tube to suction -20 cm H20 --Administed initial lytics after 48 hours off AC. Counseled on risk including bleeding. Patient expressed understanding and agrees to plan --S/p tPA/Dnase 8/21. Plan to repeat again after transfer to [facility]  --Pending response with lytics may still warrant VATS on Friday with CTS --Wean supplemental O2 for goal >88% --Continue CAP coverage. F/u culture and cytology --IS, OOB  Wheezing Nicotine abuse Former smoker --Bronchodilators: Breo and PRN albuterol --Nicotine patch --Counseled on smoking cessation   Best Practice (right click and "Reselect all SmartList Selections" daily)   Care Time: 50 min Coordinating care with hospitalist and CT surgery attendings  Mechele Collin, M.D. [facility] Pulmonary/Critical Care Medicine 10/08/2022 8:12 AM   See Amion for personal pager For hours between 7 PM to 7 AM, please call Elink for urgent questions

## 2022-10-08 NOTE — Progress Notes (Signed)
PROGRESS NOTE    Stacey Montoya  ZOX:[id number] DOB: Feb 10, 1976 DOA: 10/05/2022 PCP: Benita Stabile, MD    Brief Narrative:  47 year old with history of hypertension, hyperlipidemia, multiple myeloma status post chemotherapy now in remission, DVT on Eliquis presented to the emergency room with 3 days of worsening shortness of breath and she was found to have right-sided upper lobe pulmonary consolidation, loculated pleural effusion.  Also had cough but no fever.  Right-sided chest pressure sensation.  In the emergency room initially hypoxic and placed on 3 L oxygen.  Afebrile.  Blood pressure is stable.  Sodium 128, potassium 3.2, creatinine 4.24 from baseline about 1.2.  Mildly elevated transaminases.  IR attempted thoracentesis, only removed about 100 mL.  Pulmonary consulted for chest tube placement and lytics. Patient was undergoing Lytics through the chest tube. 8/22, overnight with worsening respiratory status.  Chest x-ray is stable.  Increasing oxygen requirement. Transferring to [facility] for VATS.  Seen by CT surgery.  Assessment & Plan:   Right-sided loculated pleural effusion, parapneumonic versus malignant effusion.  Also with associated consolidation.  Hypoxia. Not on oxygen at home.  Currently on 7 L oxygen. Continuing on IV rocephin and IV doxycycline. IR guided aspiration, transudate with 94 neutrophils.  LDH more than 400.  Going multiple organisms.  Cytopathology pending. Right-sided chest tube placement, irrigation and lytics as per pulmonary. Continue with chest physiotherapy, incentive spirometry, deep breathing exercises, sputum induction, mucolytic's and bronchodilators. Sputum cultures, blood cultures. Supplemental oxygen to keep saturations more than 90%.  Continue to hold Eliquis until procedures are completed. With inadequate improvement from chest tube, patient in need for VATS.  Transferring to [facility].  Acute kidney injury: Recent baseline creatinine of 1.2.   Presented with creatinine more than 4.   Probably prerenal.   Renal ultrasound essentially normal.   Creatinine plateauing at about 4.6-4.7.  Urine output 3 L last 24 hours. Will continue to monitor.  On maintenance bicarbonate after isotonic fluid resuscitation.  Nephrology following.    Hypokalemia: Replaced and adequate.  Multiple myeloma, currently on Revlimid.  On hold until outpatient follow-up.  Abnormal LFTs: Acute hepatitis panel negative.  Right upper quadrant ultrasound shows hepatic steatosis.  LFTs trending down.  History of DVT: Remains on Eliquis.  On hold for procedures and to avoid risk of bleeding.  Continue to hold until surgery.  DVT prophylaxis: SCDs Start: 10/05/22 1639   Code Status: Full code Family Communication: None at the bedside Disposition Plan: Status is: Inpatient Remains inpatient appropriate because: IV antibiotics, inpatient procedures     Consultants:  Pulmonary Nephrology CT surgery.  Procedures:  Chest tube placement  Antimicrobials:  Doxycycline and Rocephin 8/19--   Subjective:  Patient seen and examined.  Overnight events noted.  She was complaining of more shortness of breath overnight with poor inspiratory effort and was placed on Venturi mask. In the morning rounds, she was able to stay on 7 L of oxygen.  Anxious but adequately saturating.  Does have a cough and it hurts to cough.  Chest tube with adequate drainage, 900 mL last 24 hours after streptokinase.  Case discussed with critical care and CT surgery.  Patient will be transferred to [facility].  Notified nephrology.   Objective: Vitals:   10/08/22 0500 10/08/22 0515 10/08/22 0805 10/08/22 0825  BP:  122/85  107/84  Pulse:  (!) 113  (!) 117  Resp:  (!) 22    Temp:  98.2 F (36.8 C)  97.7 F (  36.5 C)  TempSrc:  Oral  Oral  SpO2:  93% 92% 92%  Weight: 124.3 kg     Height:        Intake/Output Summary (Last 24 hours) at 10/08/2022 0835 Last data filed at  10/08/2022 9604 Gross per 24 hour  Intake 1996.24 ml  Output 4750 ml  Net -2753.76 ml   Filed Weights   10/05/22 1049 10/08/22 0500  Weight: 117.9 kg 124.3 kg    Examination:  General exam: Anxious but not in any distress.  She is currently on 7 L oxygen. Respiratory system: Mostly conducted upper airway sounds.  Poor inspiratory effort.  On 7 L oxygen.   Cardiovascular system: S1 & S2 heard, RRR.  Gastrointestinal system: Abdomen is nondistended, soft and nontender. No organomegaly or masses felt. Normal bowel sounds heard. Central nervous system: Alert and oriented. No focal neurological deficits. Extremities: Symmetric 5 x 5 power.    Data Reviewed: I have personally reviewed following labs and imaging studies  CBC: Recent Labs  Lab 10/05/22 1130 10/06/22 0437 10/08/22 0414  WBC 6.7 6.9 7.8  NEUTROABS 5.3  --   --   HGB 11.6* 11.0* 12.8  HCT 35.2* 34.1* 39.1  MCV 102.0* 103.0* 102.4*  PLT 154 147* 148*   Basic Metabolic Panel: Recent Labs  Lab 10/05/22 1130 10/06/22 0437 10/07/22 0422 10/08/22 0414  NA 128* 129* 133* 131*  K 3.2* 3.1* 4.1 3.4*  CL 96* 98 105 97*  CO2 17* 15* 15* 19*  GLUCOSE 120* 124* 98 142*  BUN 33* 39* 44* 48*  CREATININE 4.24* 4.75* 4.68* 4.78*  CALCIUM 8.8* 8.7* 8.2* 7.8*  PHOS  --   --  4.7*  --    GFR: Estimated Creatinine Clearance: 19.1 mL/min (A) (by C-G formula based on SCr of 4.78 mg/dL (H)). Liver Function Tests: Recent Labs  Lab 10/05/22 1130 10/07/22 0422 10/08/22 0414  AST 78*  --  54*  ALT 171*  --  132*  ALKPHOS 135*  --  190*  BILITOT 1.3*  --  1.4*  PROT 7.3  --  6.2*  ALBUMIN 3.1* 2.6* 2.6*   No results for input(s): "LIPASE", "AMYLASE" in the last 168 hours. No results for input(s): "AMMONIA" in the last 168 hours. Coagulation Profile: No results for input(s): "INR", "PROTIME" in the last 168 hours. Cardiac Enzymes: No results for input(s): "CKTOTAL", "CKMB", "CKMBINDEX", "TROPONINI" in the last 168  hours. BNP (last 3 results) No results for input(s): "PROBNP" in the last 8760 hours. HbA1C: No results for input(s): "HGBA1C" in the last 72 hours. CBG: No results for input(s): "GLUCAP" in the last 168 hours. Lipid Profile: No results for input(s): "CHOL", "HDL", "LDLCALC", "TRIG", "CHOLHDL", "LDLDIRECT" in the last 72 hours. Thyroid Function Tests: No results for input(s): "TSH", "T4TOTAL", "FREET4", "T3FREE", "THYROIDAB" in the last 72 hours. Anemia Panel: No results for input(s): "VITAMINB12", "FOLATE", "FERRITIN", "TIBC", "IRON", "RETICCTPCT" in the last 72 hours. Sepsis Labs: No results for input(s): "PROCALCITON", "LATICACIDVEN" in the last 168 hours.  Recent Results (from the past 240 hour(s))  Resp panel by RT-PCR (RSV, Flu A&B, Covid) Anterior Nasal Swab     Status: None   Collection Time: 10/05/22 11:18 AM   Specimen: Anterior Nasal Swab  Result Value Ref Range Status   SARS Coronavirus 2 by RT PCR NEGATIVE NEGATIVE Final    Comment: (NOTE) SARS-CoV-2 target nucleic acids are NOT DETECTED.  The SARS-CoV-2 RNA is generally detectable in upper respiratory specimens during the acute  phase of infection. The lowest concentration of SARS-CoV-2 viral copies this assay can detect is 138 copies/mL. A negative result does not preclude SARS-Cov-2 infection and should not be used as the sole basis for treatment or other patient management decisions. A negative result may occur with  improper specimen collection/handling, submission of specimen other than nasopharyngeal swab, presence of viral mutation(s) within the areas targeted by this assay, and inadequate number of viral copies(<138 copies/mL). A negative result must be combined with clinical observations, patient history, and epidemiological information. The expected result is Negative.  Fact Sheet for Patients:  BloggerCourse.com  Fact Sheet for Healthcare Providers:   SeriousBroker.it  This test is no t yet approved or cleared by the [place] FDA and  has been authorized for detection and/or diagnosis of SARS-CoV-2 by FDA under an Emergency Use Authorization (EUA). This EUA will remain  in effect (meaning this test can be used) for the duration of the COVID-19 declaration under Section 564(b)(1) of the Act, 21 U.S.C.section 360bbb-3(b)(1), unless the authorization is terminated  or revoked sooner.       Influenza A by PCR NEGATIVE NEGATIVE Final   Influenza B by PCR NEGATIVE NEGATIVE Final    Comment: (NOTE) The Xpert Xpress SARS-CoV-2/FLU/RSV plus assay is intended as an aid in the diagnosis of influenza from Nasopharyngeal swab specimens and should not be used as a sole basis for treatment. Nasal washings and aspirates are unacceptable for Xpert Xpress SARS-CoV-2/FLU/RSV testing.  Fact Sheet for Patients: BloggerCourse.com  Fact Sheet for Healthcare Providers: SeriousBroker.it  This test is not yet approved or cleared by the [place] FDA and has been authorized for detection and/or diagnosis of SARS-CoV-2 by FDA under an Emergency Use Authorization (EUA). This EUA will remain in effect (meaning this test can be used) for the duration of the COVID-19 declaration under Section 564(b)(1) of the Act, 21 U.S.C. section 360bbb-3(b)(1), unless the authorization is terminated or revoked.     Resp Syncytial Virus by PCR NEGATIVE NEGATIVE Final    Comment: (NOTE) Fact Sheet for Patients: BloggerCourse.com  Fact Sheet for Healthcare Providers: SeriousBroker.it  This test is not yet approved or cleared by the [place] FDA and has been authorized for detection and/or diagnosis of SARS-CoV-2 by FDA under an Emergency Use Authorization (EUA). This EUA will remain in effect (meaning this test can be used) for  the duration of the COVID-19 declaration under Section 564(b)(1) of the Act, 21 U.S.C. section 360bbb-3(b)(1), unless the authorization is terminated or revoked.  Performed at [facility], [address], [place], [place] [address]   Body fluid culture w Gram Stain     Status: None (Preliminary result)   Collection Time: 10/05/22  4:55 PM   Specimen: Pleura; Body Fluid  Result Value Ref Range Status   Specimen Description   Final    PLEURAL Performed at [facility], [address] [place], [place] [address]    Special Requests   Final    NONE Performed at [facility], [address] [place], [place] [address]    Gram Stain   Final    CYTOSPIN SMEAR WBC PRESENT, PREDOMINANTLY PMN NO ORGANISMS SEEN Performed at [facility], [address], [place], [place] [address]    Culture PENDING  Incomplete   Report Status PENDING  Incomplete  Culture, blood (Routine X 2) w Reflex to ID Panel     Status: None (Preliminary result)   Collection Time:  10/06/22  8:26 AM   Specimen: BLOOD  Result Value Ref Range Status   Specimen Description   Final    BLOOD BLOOD LEFT HAND Performed at [facility], [address], [place], [place] [address]    Special Requests   Final    BOTTLES DRAWN AEROBIC ONLY Blood Culture adequate volume Performed at [facility], [address], [place], [place] [address]    Culture   Final    NO GROWTH 2 DAYS Performed at [facility], [address] [place], [place] [address]    Report Status PENDING  Incomplete  Culture, blood (Routine X 2) w Reflex to ID Panel     Status: None (Preliminary result)   Collection Time: 10/06/22  8:26 AM   Specimen: BLOOD  Result Value Ref Range Status   Specimen Description   Final    BLOOD BLOOD LEFT ARM Performed at [facility], [address], [place], [place] [address]    Special  Requests   Final    BOTTLES DRAWN AEROBIC AND ANAEROBIC Blood Culture adequate volume Performed at [facility], [address] [place], [place] [address]    Culture   Final    NO GROWTH 2 DAYS Performed at [facility], [address], [place], [place] [address]    Report Status PENDING  Incomplete  Expectorated Sputum Assessment w Gram Stain, Rflx to Resp Cult     Status: None   Collection Time: 10/06/22  8:50 AM   Specimen: Sputum  Result Value Ref Range Status   Specimen Description SPUTUM  Final   Special Requests NONE  Final   Sputum evaluation   Final    THIS SPECIMEN IS ACCEPTABLE FOR SPUTUM CULTURE Performed at [facility], [address] [place], [place] [address]    Report Status 10/06/2022 FINAL  Final  Culture, Respiratory w Gram Stain     Status: None (Preliminary result)   Collection Time: 10/06/22  8:50 AM   Specimen: SPU  Result Value Ref Range Status   Specimen Description   Final    SPUTUM Performed at [facility], [address], [place], [place] [address]    Special Requests   Final    NONE Reflexed from [id number] Performed at [facility], [address], [place], [place] [address]    Gram Stain   Final    RARE WBC PRESENT,BOTH PMN AND MONONUCLEAR MODERATE GRAM POSITIVE COCCI IN PAIRS FEW GRAM NEGATIVE RODS    Culture   Final    CULTURE REINCUBATED FOR BETTER GROWTH Performed at [facility], [address], [place], [place] [address]    Report Status PENDING  Incomplete         Radiology Studies: CT CHEST WO CONTRAST  Result Date: 10/07/2022 CLINICAL DATA:  Chronic dyspnea.  Loculated pleural effusion. EXAM: CT CHEST WITHOUT CONTRAST TECHNIQUE: Multidetector CT imaging of the chest was performed following the standard protocol without IV contrast. RADIATION DOSE REDUCTION: This exam was performed according to the departmental dose-optimization program  which includes automated exposure control, adjustment of the mA and/or kV according to patient size and/or use of iterative reconstruction technique. COMPARISON:  October 05, 2022. FINDINGS: Cardiovascular: No significant vascular findings. Normal heart size. No pericardial effusion. Mediastinum/Nodes: Thyroid gland is not well visualized. Esophagus is unremarkable. Stable right paratracheal adenopathy is noted which most likely is inflammatory in etiology. Lungs/Pleura:  There is been interval placement of pigtail pleural drainage catheter into loculated right pleural effusion. The effusion is not significantly changed in size. Continued opacity seen in right lower lobe and upper lobe consistent with pneumonia or atelectasis. Air bronchograms are noted. No pneumothorax is noted. Minimal left lingular subsegmental atelectasis or scarring is noted. New opacity is noted anteriorly in left upper lobe concerning for pneumonia. Upper Abdomen: No acute abnormality. Musculoskeletal: Status post T12 kyphoplasty. Stable lytic and sclerotic densities throughout visualized skeleton consistent with history of multiple myeloma. IMPRESSION: Interval placement of pigtail drainage catheter into loculated right pleural effusion which is not significantly changed in size. Continued right upper and lower lobe opacities are noted consistent with pneumonia or atelectasis. New opacity is noted anteriorly in left upper lobe concerning for pneumonia. Stable osseous lesions consistent with history of multiple myeloma. Electronically Signed   By: Lupita Raider M.D.   On: 10/07/2022 15:11   DG Chest Port 1 View  Result Date: 10/07/2022 CLINICAL DATA:  Chest tube surveillance EXAM: PORTABLE CHEST 1 VIEW COMPARISON:  10/06/2022 FINDINGS: Right basilar chest tube remains in place. Configuration of the chest tube raises the possibility for tube kinking. Moderate-large right pleural effusion is similar in size to prior. Associated right  basilar opacity. No pneumothorax. Heart size is normal. Left lung is clear. IMPRESSION: Moderate-large right pleural effusion, similar in size to prior. Right basilar chest tube remains in place. Configuration of the chest tube raises the possibility for tube kinking. No pneumothorax. Electronically Signed   By: Duanne Guess D.O.   On: 10/07/2022 11:30   US RENAL  Result Date: 10/06/2022 CLINICAL DATA:  Acute kidney injury. EXAM: RENAL / URINARY TRACT ULTRASOUND COMPLETE COMPARISON:  Limited right upper quadrant abdomen ultrasound dated 10/05/2022. Chest abdomen and pelvis CT dated 04/02/2020. Nuclear medicine PET-CT dated 04/24/2020 multiple myeloma. FINDINGS: Right Kidney: Renal measurements: 11.8 x 7.2 x 5 4 cm = volume: 142 mL. Echogenicity within normal limits. No mass or hydronephrosis visualized. Left Kidney: Renal measurements: 12.0 x 6.4 x 6.4 cm = volume: 161 mL. Echogenicity within normal limits. No mass or hydronephrosis visualized. Bladder: Appears normal for degree of bladder distention. Other: Stable diffusely echogenic liver with no corresponding CT abnormality. IMPRESSION: 1. Normal renal ultrasound. 2. Stable diffusely echogenic liver. This can be seen with steatosis, cirrhosis and chronic hepatitis. Electronically Signed   By: Beckie Salts M.D.   On: 10/06/2022 16:34   DG CHEST PORT 1 VIEW  Result Date: 10/06/2022 CLINICAL DATA:  Chest tube placement. EXAM: PORTABLE CHEST 1 VIEW COMPARISON:  October 05, 2022 FINDINGS: Right-sided chest tube has been placed. No evidence of pneumothorax. Similar opacification of most of the right lung. The left lung is clear. IMPRESSION: 1. Right-sided chest tube has been placed. No evidence of pneumothorax. 2. Similar opacification of most of the right lung. Electronically Signed   By: Ted Mcalpine M.D.   On: 10/06/2022 12:17        Scheduled Meds:  acyclovir  400 mg Oral BID   amLODipine  5 mg Oral QHS   docusate sodium  100 mg Oral BID    fluticasone furoate-vilanterol  1 puff Inhalation Daily   gabapentin  100 mg Oral TID   medroxyPROGESTERone  10 mg Oral Daily   metoprolol tartrate  50 mg Oral BID   nicotine  21 mg Transdermal Q24H   pantoprazole  40 mg Oral Daily   potassium chloride SA  20 mEq Oral BID  pravastatin  40 mg Oral Daily   sodium chloride flush  10 mL Intrapleural Q4H   Continuous Infusions:  cefTRIAXone (ROCEPHIN)  IV 2 g (10/07/22 1808)   doxycycline (VIBRAMYCIN) IV 100 mg (10/08/22 8469)   sodium bicarbonate 150 mEq in sterile water 1,150 mL infusion 125 mL/hr at 10/08/22 0125     LOS: 3 days    Time spent: 40 minutes    Dorcas Carrow, MD [facility]

## 2022-10-08 NOTE — Progress Notes (Signed)
[facility] Progress Note  Subjective: UOP 5.2 L yesterday, only 550 cc today. Creat up to 4.7 today. BP's on the low side 105/78 last .   Vitals:   10/08/22 0805 10/08/22 0825 10/08/22 0900 10/08/22 1050  BP:  107/84  105/78  Pulse:  (!) 117  (!) 117  Resp:    (!) 23  Temp:  97.7 F (36.5 C)  98.3 F (36.8 C)  TempSrc:  Oral  Axillary  SpO2: 92% 92% 93% 99%  Weight:      Height:        Exam: Gen alert, no distress Chest clear on L, decreased on R, R-sided chest tube RRR no RG Abd soft ntnd no mass or ascites +bs Ext mild bilat LE edema, non-pitting Neuro is alert, Ox 3 , nf        Home meds include - albuterol, lasix 60mg  every day prn, acyclovir po 400 bid, norvasc 5 hs, eliquis, gabapentin, revlimid, provera, lopressor, prilosec, ocyIR, klorcon, pravachol     Date                           Creat               eGFR   Feb 2022                   3.38 >> 0.80    AKI episode related to new myeloma, ^Ca++   Rest of 2022              0.66- 0.85   Feb- jun 2023            0.80- 0.97   Oct -dec 2023           1.05- 1.60   Jan 2024                   1.13                 > 60 ml/min   Feb 2024                   1.53    Mar 2024                  1.16    06/30/22                     1.22                 56 ml/min    07/14/22                     1.18    7/08                          1.44    7/31                          1.21    8/19                          4.24    10/06/22                     4.75       UA prot 30, 0-5 rbc/ wbc/ epi   UNa 20, UCr 25  Renal US -  11.8/ 12.0 cm kidneys w/o hydro, echo wnl     CXR today 8/22 --> L chest still negative, worsening R effusion w/ slight leftward mediastinal /tracheal shift    Assessment/ Plan: AKI on CKD 3a - b/l creat 1.13- 1.22, eGFR 56- >60 ml/min, from early 2024. Creatinine here was 4.2 on admission yesterday, up to 4.75 today. Just had 5 day illness at home w/ fevers, diarrhea, and admitted w/ PNA and large  pleural effusion sp chest tube. No acei/ ARB, no contrast, no nsaids, no hypotension. H/o myeloma, has been in remission since SCT in 2022. UA negative, urine lytes unremarkable and renal US w/o obstruction. Unclear cause of AKI - vol depletion vs ATN (recurrence of myeloma vs other) vs other. GN unlikely w/ normal UA. 3 L bolus given 8/20 w/o no improvement in renal function. D/w Dr. Candise Che her oncologist, serum free LC's ordered 8/21 and pending.  Creat no better again today. Suspect nonoliguric ATN w/ or w/o myeloma. No uremic signs as of yet. Will dc IVF"s for now w/ resp issues. Will follow.  H/o multiple myeloma - sp autologous stem cell reinfusion (SCT) on 10/15/20 HTN - bp's are soft today, will dc norvasc and lower metoprolol to 12.5 bid w/ hold orders for SBP < 120           Rob Milford Cilento MD  [facility] 10/08/2022, 2:59 PM  Recent Labs  Lab 10/06/22 0437 10/07/22 0422 10/08/22 0414  HGB 11.0*  --  12.8  ALBUMIN  --  2.6* 2.6*  CALCIUM 8.7* 8.2* 7.8*  PHOS  --  4.7*  --   CREATININE 4.75* 4.68* 4.78*  K 3.1* 4.1 3.4*   No results for input(s): "IRON", "TIBC", "FERRITIN" in the last 168 hours. Inpatient medications:  acyclovir  400 mg Oral BID   amLODipine  5 mg Oral QHS   docusate sodium  100 mg Oral BID   fluticasone furoate-vilanterol  1 puff Inhalation Daily   gabapentin  100 mg Oral TID   medroxyPROGESTERone  10 mg Oral Daily   metoprolol tartrate  50 mg Oral BID   nicotine  21 mg Transdermal Q24H   pantoprazole  40 mg Oral Daily   potassium chloride SA  20 mEq Oral BID   pravastatin  40 mg Oral Daily   sodium chloride flush  10 mL Intrapleural Q4H    cefTRIAXone (ROCEPHIN)  IV 2 g (10/07/22 1808)   doxycycline (VIBRAMYCIN) IV 100 mg (10/08/22 5638)   sodium bicarbonate 150 mEq in sterile water 1,150 mL infusion Stopped (10/08/22 1439)   acetaminophen **OR** acetaminophen, albuterol, HYDROmorphone (DILAUDID) injection, ondansetron **OR** ondansetron (ZOFRAN) IV, oxyCODONE,  traZODone

## 2022-10-08 NOTE — Progress Notes (Signed)
Patient brought to 4E from [facility]. Telemetry wall monitor applied, CCMD notified. Patient oriented to room and staff. Call bell in reach.    Kenard Gower, RN

## 2022-10-08 NOTE — Progress Notes (Signed)
Pt is having 10/10 pain on chest tube site. HR 124. Flushed pleural. Oxygen 94% 4L. Sudden onset. Unrelieved by positioning. MD notified. Chest xray ordered. Rapid response notified.  Kenard Gower, RN

## 2022-10-08 NOTE — Progress Notes (Signed)
Patient brought to 4E from [facility]. Telemetry wall monitor applied, CCMD notified. Patient oriented to room and staff. Call bell in reach.    Kenard Gower, RN     10/08/22 1050  Vitals  Temp 98.3 F (36.8 C)  Temp Source Axillary  BP 105/78  MAP (mmHg) 89  BP Location Right Arm  BP Method Automatic  Patient Position (if appropriate) Lying  Pulse Rate (!) 117  Pulse Rate Source Monitor  ECG Heart Rate (!) 117  Resp (!) 23  Level of Consciousness  Level of Consciousness Alert  MEWS COLOR  MEWS Score Color Yellow  Oxygen Therapy  SpO2 99 %  O2 Device Venturi Mask  O2 Flow Rate (L/min) 12 L/min  FiO2 (%) 50 %  MEWS Score  MEWS Temp 0  MEWS Systolic 0  MEWS Pulse 2  MEWS RR 1  MEWS LOC 0  MEWS Score 3

## 2022-10-09 ENCOUNTER — Inpatient Hospital Stay (HOSPITAL_COMMUNITY): Payer: 59

## 2022-10-09 DIAGNOSIS — J918 Pleural effusion in other conditions classified elsewhere: Secondary | ICD-10-CM

## 2022-10-09 DIAGNOSIS — J90 Pleural effusion, not elsewhere classified: Secondary | ICD-10-CM | POA: Diagnosis not present

## 2022-10-09 LAB — KAPPA/LAMBDA LIGHT CHAINS
Kappa free light chain: 88 mg/L — ABNORMAL HIGH (ref 3.3–19.4)
Kappa, lambda light chain ratio: 1.32 (ref 0.26–1.65)
Lambda free light chains: 66.5 mg/L — ABNORMAL HIGH (ref 5.7–26.3)

## 2022-10-09 LAB — COMPREHENSIVE METABOLIC PANEL
ALT: 78 U/L — ABNORMAL HIGH (ref 0–44)
AST: 45 U/L — ABNORMAL HIGH (ref 15–41)
Albumin: 2 g/dL — ABNORMAL LOW (ref 3.5–5.0)
Alkaline Phosphatase: 111 U/L (ref 38–126)
Anion gap: 22 — ABNORMAL HIGH (ref 5–15)
BUN: 67 mg/dL — ABNORMAL HIGH (ref 6–20)
CO2: 12 mmol/L — ABNORMAL LOW (ref 22–32)
Calcium: 6.2 mg/dL — CL (ref 8.9–10.3)
Chloride: 89 mmol/L — ABNORMAL LOW (ref 98–111)
Creatinine, Ser: 6.37 mg/dL — ABNORMAL HIGH (ref 0.44–1.00)
GFR, Estimated: 8 mL/min — ABNORMAL LOW (ref >60)
Glucose, Bld: 105 mg/dL — ABNORMAL HIGH (ref 70–99)
Potassium: 4 mmol/L (ref 3.5–5.1)
Sodium: 123 mmol/L — ABNORMAL LOW (ref 135–145)
Total Bilirubin: 1.2 mg/dL (ref 0.3–1.2)
Total Protein: 5 g/dL — ABNORMAL LOW (ref 6.5–8.1)

## 2022-10-09 LAB — CBC
HCT: 34.5 % — ABNORMAL LOW (ref 36.0–46.0)
Hemoglobin: 11.5 g/dL — ABNORMAL LOW (ref 12.0–15.0)
MCH: 33.7 pg (ref 26.0–34.0)
MCHC: 33.3 g/dL (ref 30.0–36.0)
MCV: 101.2 fL — ABNORMAL HIGH (ref 80.0–100.0)
Platelets: 132 10*3/uL — ABNORMAL LOW (ref 150–400)
RBC: 3.41 MIL/uL — ABNORMAL LOW (ref 3.87–5.11)
RDW: 17.1 % — ABNORMAL HIGH (ref 11.5–15.5)
WBC: 5.7 10*3/uL (ref 4.0–10.5)
nRBC: 0 % (ref 0.0–0.2)

## 2022-10-09 LAB — RENAL FUNCTION PANEL
Albumin: 1.8 g/dL — ABNORMAL LOW (ref 3.5–5.0)
Anion gap: 16 — ABNORMAL HIGH (ref 5–15)
BUN: 77 mg/dL — ABNORMAL HIGH (ref 6–20)
CO2: 14 mmol/L — ABNORMAL LOW (ref 22–32)
Calcium: 6.1 mg/dL — CL (ref 8.9–10.3)
Chloride: 89 mmol/L — ABNORMAL LOW (ref 98–111)
Creatinine, Ser: 6.97 mg/dL — ABNORMAL HIGH (ref 0.44–1.00)
GFR, Estimated: 7 mL/min — ABNORMAL LOW (ref >60)
Glucose, Bld: 93 mg/dL (ref 70–99)
Phosphorus: 8.5 mg/dL — ABNORMAL HIGH (ref 2.5–4.6)
Potassium: 3.8 mmol/L (ref 3.5–5.1)
Sodium: 119 mmol/L — CL (ref 135–145)

## 2022-10-09 LAB — CK: Total CK: 386 U/L — ABNORMAL HIGH (ref 38–234)

## 2022-10-09 MED ORDER — STERILE WATER FOR INJECTION IJ SOLN
5.0000 mg | Freq: Once | RESPIRATORY_TRACT | Status: AC
  Administered 2022-10-09: 5 mg via INTRAPLEURAL
  Filled 2022-10-09: qty 5

## 2022-10-09 MED ORDER — SODIUM CHLORIDE (PF) 0.9 % IJ SOLN
10.0000 mg | Freq: Once | INTRAMUSCULAR | Status: AC
  Administered 2022-10-09: 10 mg via INTRAPLEURAL
  Filled 2022-10-09: qty 10

## 2022-10-09 MED ORDER — SODIUM CHLORIDE 0.9% FLUSH
10.0000 mL | Freq: Three times a day (TID) | INTRAVENOUS | Status: DC
  Administered 2022-10-09 – 2022-10-14 (×13): 10 mL via INTRAPLEURAL

## 2022-10-09 MED ORDER — GLUCERNA SHAKE PO LIQD: 237.0000 mL | Freq: Every day | ORAL | Status: DC | PRN

## 2022-10-09 NOTE — Progress Notes (Signed)
PROGRESS NOTE    Stacey Montoya  OZD:[id number] DOB: Sep 27, 1975 DOA: 10/05/2022 PCP: Benita Stabile, MD   Brief Narrative:  47 year old with history of hypertension, hyperlipidemia, multiple myeloma status post chemotherapy now in remission, DVT on Eliquis presented to the emergency room with 3 days of worsening shortness of breath and she was found to have right-sided upper lobe pulmonary consolidation, loculated pleural effusion along with AKI on CKD stage IIIa.  She was started on antibiotics.  She underwent right chest tube placement by pulmonary on 10/06/2022.  Nephrology and cardiothoracic surgery were also consulted.  She was transferred to [facility] for possibility of need of VATS by cardiothoracic surgery.  Assessment & Plan:   Acute respiratory failure with hypoxia Community-acquired right-sided pneumonia with parapneumonic effusion -Pulmonary and CT surgery following.  Patient transferred  to [facility] for possibility of need of VATS by cardiothoracic surgery. -Chest tube currently being managed by pulmonary.  Patient also getting pleural fibrinolytics as per pulmonary. -Currently on Rocephin and doxycycline. -Respiratory status improving.  Currently on 4 L oxygen via nasal cannula.  Wean off as able.  Acute kidney injury on on CKD stage IIIa Acute metabolic acidosis -Baseline creatinine of 1.13-1.22 -Unclear cause.  Nephrology following.  Off IV fluids.  Creatinine worsened to 6.37 today. -Monitor. -Strict input and output.  Hypokalemia -Improved  Hyponatremia -Sodium worsening.  Monitor.  Follow nephrology recommendations  Multiple myeloma -Revlimid to be on hold till reevaluation by Dr. Candise Che as an outpatient as per his recommendations.  Abnormal LFTs -Hepatitis panel negative.  Right upper quadrant ultrasound showed hepatic steatosis.  LFTs trending down.  Monitor  History of DVT -Eliquis on hold for now.  Resume if cleared by  consultants  Morbid obesity -Outpatient follow-up  DVT prophylaxis: SCDs Code Status: Full Family Communication: Mother at bedside Disposition Plan: Status is: Inpatient Remains inpatient appropriate because: Of severity of illness    Consultants: Pulmonary/nephrology/cardiothoracic surgery  Procedures: As above  Antimicrobials:  Anti-infectives (From admission, onward)    Start     Dose/Rate Route Frequency Ordered Stop   10/05/22 2200  acyclovir (ZOVIRAX) tablet 400 mg        400 mg Oral 2 times daily 10/05/22 1640     10/05/22 2000  cefTRIAXone (ROCEPHIN) 2 g in sodium chloride 0.9 % 100 mL IVPB        2 g 200 mL/hr over 30 Minutes Intravenous Daily-1800 10/05/22 1909     10/05/22 1800  doxycycline (VIBRAMYCIN) 100 mg in sodium chloride 0.9 % 250 mL IVPB        100 mg 125 mL/hr over 120 Minutes Intravenous Every 12 hours 10/05/22 1647 10/10/22 1759   10/05/22 1645  levofloxacin (LEVAQUIN) IVPB 500 mg  Status:  Discontinued        500 mg 100 mL/hr over 60 Minutes Intravenous Every 24 hours 10/05/22 1641 10/05/22 1641   10/05/22 1645  Levofloxacin (LEVAQUIN) IVPB 250 mg  Status:  Discontinued        250 mg 50 mL/hr over 60 Minutes Intravenous Every 24 hours 10/05/22 1641 10/05/22 1647        Subjective: Patient seen and examined at bedside.  She feels slightly better she can take deeper breaths this morning.  Denies worsening chest pain currently.  No fever or vomiting reported.  Objective: Vitals:   10/09/22 0242 10/09/22 0500 10/09/22 0617 10/09/22 0758  BP: 123/64   128/60  Pulse: (!) 112  (!) 111 (!) 109  Resp: 14  17 16   Temp: 98.1 F (36.7 C)   98.2 F (36.8 C)  TempSrc: Oral   Oral  SpO2: 99%  100% 100%  Weight:  125.4 kg 124.1 kg   Height:        Intake/Output Summary (Last 24 hours) at 10/09/2022 1120 Last data filed at 10/09/2022 0617 Gross per 24 hour  Intake 1855.73 ml  Output 2950 ml  Net -1094.27 ml   Filed Weights   10/08/22 0500  10/09/22 0500 10/09/22 0617  Weight: 124.3 kg 125.4 kg 124.1 kg    Examination:  General exam: Appears calm and comfortable.  On 4 L oxygen by nasal cannula. Respiratory system: Bilateral decreased breath sounds at bases with scattered crackles and right-sided chest tube present Cardiovascular system: S1 & S2 heard, Rate controlled Gastrointestinal system: Abdomen is morbidly obese, nondistended, soft and nontender. Normal bowel sounds heard. Extremities: No cyanosis, clubbing, edema  Central nervous system: Alert and oriented. No focal neurological deficits. Moving extremities Skin: No rashes, lesions or ulcers Psychiatry: Judgement and insight appear normal. Mood & affect appropriate.     Data Reviewed: I have personally reviewed following labs and imaging studies  CBC: Recent Labs  Lab 10/05/22 1130 10/06/22 0437 10/08/22 0414 10/08/22 2018 10/09/22 0429  WBC 6.7 6.9 7.8 9.2 5.7  NEUTROABS 5.3  --   --   --   --   HGB 11.6* 11.0* 12.8 12.2 11.5*  HCT 35.2* 34.1* 39.1 36.5 34.5*  MCV 102.0* 103.0* 102.4* 98.1 101.2*  PLT 154 147* 148* 174 132*   Basic Metabolic Panel: Recent Labs  Lab 10/05/22 1130 10/06/22 0437 10/07/22 0422 10/08/22 0414 10/09/22 0429  NA 128* 129* 133* 131* 123*  K 3.2* 3.1* 4.1 3.4* 4.0  CL 96* 98 105 97* 89*  CO2 17* 15* 15* 19* 12*  GLUCOSE 120* 124* 98 142* 105*  BUN 33* 39* 44* 48* 67*  CREATININE 4.24* 4.75* 4.68* 4.78* 6.37*  CALCIUM 8.8* 8.7* 8.2* 7.8* 6.2*  PHOS  --   --  4.7*  --   --    GFR: Estimated Creatinine Clearance: 14.3 mL/min (A) (by C-G formula based on SCr of 6.37 mg/dL (H)). Liver Function Tests: Recent Labs  Lab 10/05/22 1130 10/07/22 0422 10/08/22 0414 10/09/22 0429  AST 78*  --  54* 45*  ALT 171*  --  132* 78*  ALKPHOS 135*  --  190* 111  BILITOT 1.3*  --  1.4* 1.2  PROT 7.3  --  6.2* 5.0*  ALBUMIN 3.1* 2.6* 2.6* 2.0*   No results for input(s): "LIPASE", "AMYLASE" in the last 168 hours. No results for  input(s): "AMMONIA" in the last 168 hours. Coagulation Profile: No results for input(s): "INR", "PROTIME" in the last 168 hours. Cardiac Enzymes: No results for input(s): "CKTOTAL", "CKMB", "CKMBINDEX", "TROPONINI" in the last 168 hours. BNP (last 3 results) No results for input(s): "PROBNP" in the last 8760 hours. HbA1C: No results for input(s): "HGBA1C" in the last 72 hours. CBG: No results for input(s): "GLUCAP" in the last 168 hours. Lipid Profile: No results for input(s): "CHOL", "HDL", "LDLCALC", "TRIG", "CHOLHDL", "LDLDIRECT" in the last 72 hours. Thyroid Function Tests: No results for input(s): "TSH", "T4TOTAL", "FREET4", "T3FREE", "THYROIDAB" in the last 72 hours. Anemia Panel: No results for input(s): "VITAMINB12", "FOLATE", "FERRITIN", "TIBC", "IRON", "RETICCTPCT" in the last 72 hours. Sepsis Labs: No results for input(s): "PROCALCITON", "LATICACIDVEN" in the last 168 hours.  Recent Results (from the past 240 hour(s))  Resp panel by RT-PCR (RSV, Flu A&B, Covid) Anterior Nasal Swab     Status: None   Collection Time: 10/05/22 11:18 AM   Specimen: Anterior Nasal Swab  Result Value Ref Range Status   SARS Coronavirus 2 by RT PCR NEGATIVE NEGATIVE Final    Comment: (NOTE) SARS-CoV-2 target nucleic acids are NOT DETECTED.  The SARS-CoV-2 RNA is generally detectable in upper respiratory specimens during the acute phase of infection. The lowest concentration of SARS-CoV-2 viral copies this assay can detect is 138 copies/mL. A negative result does not preclude SARS-Cov-2 infection and should not be used as the sole basis for treatment or other patient management decisions. A negative result may occur with  improper specimen collection/handling, submission of specimen other than nasopharyngeal swab, presence of viral mutation(s) within the areas targeted by this assay, and inadequate number of viral copies(<138 copies/mL). A negative result must be combined with clinical  observations, patient history, and epidemiological information. The expected result is Negative.  Fact Sheet for Patients:  BloggerCourse.com  Fact Sheet for Healthcare Providers:  SeriousBroker.it  This test is no t yet approved or cleared by the [place] FDA and  has been authorized for detection and/or diagnosis of SARS-CoV-2 by FDA under an Emergency Use Authorization (EUA). This EUA will remain  in effect (meaning this test can be used) for the duration of the COVID-19 declaration under Section 564(b)(1) of the Act, 21 U.S.C.section 360bbb-3(b)(1), unless the authorization is terminated  or revoked sooner.       Influenza A by PCR NEGATIVE NEGATIVE Final   Influenza B by PCR NEGATIVE NEGATIVE Final    Comment: (NOTE) The Xpert Xpress SARS-CoV-2/FLU/RSV plus assay is intended as an aid in the diagnosis of influenza from Nasopharyngeal swab specimens and should not be used as a sole basis for treatment. Nasal washings and aspirates are unacceptable for Xpert Xpress SARS-CoV-2/FLU/RSV testing.  Fact Sheet for Patients: BloggerCourse.com  Fact Sheet for Healthcare Providers: SeriousBroker.it  This test is not yet approved or cleared by the [place] FDA and has been authorized for detection and/or diagnosis of SARS-CoV-2 by FDA under an Emergency Use Authorization (EUA). This EUA will remain in effect (meaning this test can be used) for the duration of the COVID-19 declaration under Section 564(b)(1) of the Act, 21 U.S.C. section 360bbb-3(b)(1), unless the authorization is terminated or revoked.     Resp Syncytial Virus by PCR NEGATIVE NEGATIVE Final    Comment: (NOTE) Fact Sheet for Patients: BloggerCourse.com  Fact Sheet for Healthcare Providers: SeriousBroker.it  This test is not yet approved or cleared by  the [place] FDA and has been authorized for detection and/or diagnosis of SARS-CoV-2 by FDA under an Emergency Use Authorization (EUA). This EUA will remain in effect (meaning this test can be used) for the duration of the COVID-19 declaration under Section 564(b)(1) of the Act, 21 U.S.C. section 360bbb-3(b)(1), unless the authorization is terminated or revoked.  Performed at [facility], [address], [place], [place] [address]   Body fluid culture w Gram Stain     Status: None (Preliminary result)   Collection Time: 10/05/22  4:55 PM   Specimen: Pleura; Body Fluid  Result Value Ref Range Status   Specimen Description   Final    PLEURAL Performed at [facility], [address], [place], [place] [address]    Special Requests   Final    NONE Performed at [facility], [address]  [place], [place] [address]    Gram Stain   Final    CYTOSPIN SMEAR WBC PRESENT, PREDOMINANTLY PMN NO ORGANISMS SEEN    Culture   Final    NO GROWTH 2 DAYS Performed at [facility], [address] [place], [place] [address]    Report Status PENDING  Incomplete  Culture, blood (Routine X 2) w Reflex to ID Panel     Status: None (Preliminary result)   Collection Time: 10/06/22  8:26 AM   Specimen: BLOOD  Result Value Ref Range Status   Specimen Description   Final    BLOOD BLOOD LEFT HAND Performed at [facility], [address], [place], [place] [address]    Special Requests   Final    BOTTLES DRAWN AEROBIC ONLY Blood Culture adequate volume Performed at [facility], [address] [place], [place] [address]    Culture   Final    NO GROWTH 3 DAYS Performed at [facility], [address] [place], [place] [address]    Report Status PENDING  Incomplete  Culture, blood (Routine X 2) w Reflex to ID Panel     Status: None (Preliminary result)   Collection Time: 10/06/22   8:26 AM   Specimen: BLOOD  Result Value Ref Range Status   Specimen Description   Final    BLOOD BLOOD LEFT ARM Performed at [facility], [address], [place], [place] [address]    Special Requests   Final    BOTTLES DRAWN AEROBIC AND ANAEROBIC Blood Culture adequate volume Performed at [facility], [address], [place], [place] [address]    Culture   Final    NO GROWTH 3 DAYS Performed at [facility], [address] [place], [place] [address]    Report Status PENDING  Incomplete  Expectorated Sputum Assessment w Gram Stain, Rflx to Resp Cult     Status: None   Collection Time: 10/06/22  8:50 AM   Specimen: Sputum  Result Value Ref Range Status   Specimen Description SPUTUM  Final   Special Requests NONE  Final   Sputum evaluation   Final    THIS SPECIMEN IS ACCEPTABLE FOR SPUTUM CULTURE Performed at [facility], [address], [place], [place] [address]    Report Status 10/06/2022 FINAL  Final  Culture, Respiratory w Gram Stain     Status: None   Collection Time: 10/06/22  8:50 AM   Specimen: SPU  Result Value Ref Range Status   Specimen Description   Final    SPUTUM Performed at [facility], [address], [place], [place] [address]    Special Requests   Final    NONE Reflexed from [id number] Performed at [facility], [address], [place], [place] [address]    Gram Stain   Final    RARE WBC PRESENT,BOTH PMN AND MONONUCLEAR MODERATE GRAM POSITIVE COCCI IN PAIRS FEW GRAM NEGATIVE RODS    Culture   Final    FEW Normal respiratory flora-no Staph aureus or Pseudomonas seen Performed at [facility], [address], [place], [place] [address]    Report Status 10/08/2022 FINAL  Final         Radiology Studies: DG Chest Port 1 View  Result Date: 10/09/2022 CLINICAL DATA:  Chest tube in place. EXAM: PORTABLE CHEST 1 VIEW COMPARISON:  October 08, 2022. FINDINGS: The heart  size and mediastinal contours are within normal limits. Right-sided chest tube is unchanged in position. No pneumothorax is noted. Mild to moderate right pleural effusion is noted which is slightly decreased compared to prior exam, with associated right basilar atelectasis. Left lung is unremarkable. Stable expansile lesions are noted in the ribs bilaterally consistent with history of multiple myeloma. IMPRESSION: Stable position of right-sided chest tube. Right pleural effusion is slightly decreased in size. Electronically Signed   By: Lupita Raider M.D.   On: 10/09/2022 09:04   DG CHEST PORT 1 VIEW  Result Date: 10/08/2022 CLINICAL DATA:  Chest pain EXAM: PORTABLE CHEST 1 VIEW COMPARISON:  Chest x-ray 10/05/2022.  Chest CT 10/07/2022. FINDINGS: Right pleural drainage catheter is present. There is a small to moderate-sized right pleural effusion which has decreased. Underlying right basilar airspace disease appears stable. No pneumothorax. The cardiomediastinal silhouette is stable. Osseous structures are unchanged. IMPRESSION: 1. Right pleural drainage catheter in place with a small to moderate-sized right pleural effusion which has decreased. 2. Stable underlying right basilar airspace disease. Electronically Signed   By: Darliss Cheney M.D.   On: 10/08/2022 19:13   DG CHEST PORT 1 VIEW  Result Date: 10/08/2022 CLINICAL DATA:  47 year old female with loculated pleural effusion and chest tube. EXAM: PORTABLE CHEST 1 VIEW COMPARISON:  Chest CT 10/07/2022 and earlier. FINDINGS: Portable AP semi upright view at 0755 hours. Pigtail right chest tube appears stable and configuration from the CT yesterday. Compared to that scout view right upper lung region pleural effusion has progressed, with increased right upper lung compressive atelectasis. Subtotal opacification of the right lower lung not significantly changed. Slightly increased leftward mediastinal and tracheal shift. Left lung  appears stable and negative. Paucity of bowel gas in the upper abdomen. Stable visualized osseous structures. IMPRESSION: Progressive right pleural effusion and right lung compressive atelectasis since yesterday despite indwelling right pigtail chest tube. Electronically Signed   By: Odessa Fleming M.D.   On: 10/08/2022 08:55   CT CHEST WO CONTRAST  Result Date: 10/07/2022 CLINICAL DATA:  Chronic dyspnea.  Loculated pleural effusion. EXAM: CT CHEST WITHOUT CONTRAST TECHNIQUE: Multidetector CT imaging of the chest was performed following the standard protocol without IV contrast. RADIATION DOSE REDUCTION: This exam was performed according to the departmental dose-optimization program which includes automated exposure control, adjustment of the mA and/or kV according to patient size and/or use of iterative reconstruction technique. COMPARISON:  October 05, 2022. FINDINGS: Cardiovascular: No significant vascular findings. Normal heart size. No pericardial effusion. Mediastinum/Nodes: Thyroid gland is not well visualized. Esophagus is unremarkable. Stable right paratracheal adenopathy is noted which most likely is inflammatory in etiology. Lungs/Pleura: There is been interval placement of pigtail pleural drainage catheter into loculated right pleural effusion. The effusion is not significantly changed in size. Continued opacity seen in right lower lobe and upper lobe consistent with pneumonia or atelectasis. Air bronchograms are noted. No pneumothorax is noted. Minimal left lingular subsegmental atelectasis or scarring is noted. New opacity is noted anteriorly in left upper lobe concerning for pneumonia. Upper Abdomen: No acute abnormality. Musculoskeletal: Status post T12 kyphoplasty. Stable lytic and sclerotic densities throughout visualized skeleton consistent with history of multiple myeloma. IMPRESSION: Interval placement of pigtail drainage catheter into loculated right pleural effusion which is not significantly  changed in size. Continued right upper and lower lobe opacities are noted consistent with pneumonia or atelectasis. New opacity is noted anteriorly in left upper lobe concerning for pneumonia. Stable osseous lesions consistent with history of multiple  myeloma. Electronically Signed   By: Lupita Raider M.D.   On: 10/07/2022 15:11        Scheduled Meds:  acyclovir  400 mg Oral BID   docusate sodium  100 mg Oral BID   fluticasone furoate-vilanterol  1 puff Inhalation Daily   gabapentin  100 mg Oral TID   ketorolac  15 mg Intravenous Q6H   medroxyPROGESTERone  10 mg Oral Daily   metoprolol tartrate  12.5 mg Oral BID   nicotine  21 mg Transdermal Q24H   pantoprazole  40 mg Oral Daily   potassium chloride SA  20 mEq Oral BID   pravastatin  40 mg Oral Daily   sodium chloride flush  10 mL Intrapleural Q4H   Continuous Infusions:  cefTRIAXone (ROCEPHIN)  IV 2 g (10/08/22 2128)   doxycycline (VIBRAMYCIN) IV 100 mg (10/08/22 2330)   methocarbamol (ROBAXIN) IV 500 mg (10/09/22 0656)          Glade Lloyd, MD [facility] 10/09/2022, 11:20 AM

## 2022-10-09 NOTE — Plan of Care (Signed)

## 2022-10-09 NOTE — Care Management Important Message (Signed)
Important Message  Patient Details  Name: JAISHA JAPP MRN: [id number] Date of Birth: December 13, 1975   Medicare Important Message Given:  Yes     Renie Ora 10/09/2022, 8:54 AM

## 2022-10-09 NOTE — Progress Notes (Signed)
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  Marland Kitchen Last seen by Oncology 7/31. During that visit noted myeloma stable  and she is being managed on Revlimid and zometa EO month. Her disease is felt to be stable w/ the exception of prior hypercalcemia and recent AKI back in May 2024. Presents to ER 8/19 w/ cc right sided pleuritic chest pain, cough productive of thick brown sputum, wheezing and increased shortness of breath. Onset initially noted on 8/15 w/ fever > 101 full body aches and chills. No obvious sick exposures but was in contact w/ young child prior. In ER RVP neg, BNP 112, wbc 6.7. A CT of chest was obtained w/out contrast. This showed loculated right pleural effusion w/ associated RLL and RML airspace disease. Cultures were sent. She was started on empiric antibiotics and referred to interventional radiology where she underwent right sided thoracentesis. The procedure was stopped after only being able top  drain 115 ml of yellow pleural fluid. Review of US showed multiple septations. Initial pleural LDH 401. 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 ABX started. Right sided thoracentesis  exudate removed. Volume limited due to septations and loculation of pleural fluid. PCCM asked to eval  8/20 right sided small bore CT placed. Initially 90cc concentrated yellow pleural fluid.  8/21 S/p initial tpa/dnase  Interim History / Subjective:  Chest tube put out 3900 cc in 24 hours  Objective   Blood pressure 128/60, pulse (!) 109, temperature 98.2 F (36.8 C), temperature source Oral, resp. rate 16, height 5' 3.25" (1.607 m), weight 124.1 kg, SpO2 100%.        Intake/Output Summary (Last 24 hours) at 10/09/2022 1106 Last data filed at 10/09/2022 0617 Gross per 24 hour  Intake 1855.73 ml  Output 2950 ml  Net -1094.27 ml   Filed Weights   10/08/22 0500 10/09/22 0500 10/09/22 0617  Weight: 124.3 kg 125.4 kg 124.1 kg    Physical Exam: Obese female no acute distress Right chest tube with serous drainage total of 3900 cc for 24 hours Diminished breath sounds on the right Heart sounds are regular Abdomen is obese soft nontender Extremities with edema and compression hose in place   Resolved Hospital Problem list    Assessment & Plan:   Acute hypoxic respiratory failure  CAP (in pt w/ compromised immune system) Complex/exudative right pleural effusion  Wheezing  Tobacco abuse (Vapes)  Obesity  HTN  Acute on chronic renal failure CKD Stage 3a - GFR 45 to 59 (Mildly to moderately decreased)  Hyponatremia  RISS 3 multiple myeloma on Revlimid and zometa 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 Hypokalemia  Abnormal LFTs [place] line Anion gap metabolic acidosis  Pulm problem list Acute hypoxic respiratory failure 2/2 CAP in patient w/ immunosuppression complicated further by complex/ exudative pleural effusion Right chest tube placed 10/06/2022 Thrombolytics administered 10/08/2022 with 3900 cc of drainage Suggest we repeat thrombolytics at least 1 more time on 10/09/2022 Wean O2 as tolerated Out of bed as tolerated Continue antimicrobial coverage    Wheezing Nicotine abuse Former  smoker Bronchodilators Nicotine patch Stop smoking  History of multiple myeloma Currently in remission   Best Practice (right click and "Reselect all SmartList Selections" daily)   Stacey Montoya Stacey Montoya ACNP Acute Care Nurse Practitioner [facility] Pulmonary/Critical Care Please consult Amion 10/09/2022, 11:07 AM

## 2022-10-09 NOTE — Procedures (Signed)
Pleural Fibrinolytic Administration Procedure Note  ARASELI BAXTER  [id number]  [birth year]  Date:10/09/22  Time:2:08 PM   Provider Performing:Shatisha Falter E Ebany Bowermaster   Procedure: Pleural Fibrinolysis Subsequent day (10272)  Indication(s) Fibrinolysis of complicated pleural effusion  Consent Risks of the procedure as well as the alternatives and risks of each were explained to the patient and/or caregiver.  Consent for the procedure was obtained.   Anesthesia None   Time Out Verified patient identification, verified procedure, site/side was marked, verified correct patient position, special equipment/implants available, medications/allergies/relevant history reviewed, required imaging and test results available.   Sterile Technique Hand hygiene, gloves   Procedure Description Existing pleural catheter was cleaned and accessed in sterile manner.  10mg  of tPA in 30cc of saline and 5mg  of dornase in 30cc of sterile water were injected into pleural space using existing pleural catheter.  Catheter will be clamped for 1 hour and then placed back to suction.  Instilled at 1403  Unclamp in 1 hours Rec timing oxy +/- dilaudid for pain pt has had following previous unclamping. D/w RN.   Complications/Tolerance None; patient tolerated the procedure well.  EBL None   Specimen(s) None    Tessie Fass MSN, AGACNP-BC [facility] Pulmonary/Critical Care Medicine Amion for pager 10/09/2022, 2:10 PM

## 2022-10-09 NOTE — Progress Notes (Signed)
Admit: 10/05/2022 LOS: 4  27F with AKI after presenting with loculated parapneumonic effusion, hx/o autologous SCT for MM  Subjective:  No sig UOP since coming to [facility], bladder scan 0 SCr up  to 6.4, K 4.0, Na 123 was 131 Drained > 3L after tpa dwell in pleural space Renal US 8/20 was negative for acute issues Has rec 2 doses of toradol  08/22 0701 - 08/23 0700 In: 1865.7 [P.O.:480; I.V.:1095.7; IV Piggyback:250] Out: 3110 [Chest Tube:3110]  Filed Weights   10/08/22 0500 10/09/22 0500 10/09/22 0617  Weight: 124.3 kg 125.4 kg 124.1 kg    Scheduled Meds:  acyclovir  400 mg Oral BID   alteplase (CATHFLO ACTIVASE) 10 mg in sodium chloride (PF) 0.9 % 30 mL  10 mg Intrapleural Once   And   dornase alfa (PULMOZYME) 5 mg in sterile water (preservative free) 30 mL  5 mg Intrapleural Once   docusate sodium  100 mg Oral BID   fluticasone furoate-vilanterol  1 puff Inhalation Daily   gabapentin  100 mg Oral TID   medroxyPROGESTERone  10 mg Oral Daily   metoprolol tartrate  12.5 mg Oral BID   nicotine  21 mg Transdermal Q24H   pantoprazole  40 mg Oral Daily   potassium chloride SA  20 mEq Oral BID   pravastatin  40 mg Oral Daily   sodium chloride flush  10 mL Intrapleural Q4H   sodium chloride flush  10 mL Intrapleural Q8H   Continuous Infusions:  cefTRIAXone (ROCEPHIN)  IV 2 g (10/08/22 2128)   doxycycline (VIBRAMYCIN) IV 100 mg (10/08/22 2330)   methocarbamol (ROBAXIN) IV 500 mg (10/09/22 0656)   PRN Meds:.acetaminophen **OR** acetaminophen, albuterol, HYDROmorphone (DILAUDID) injection, ondansetron **OR** ondansetron (ZOFRAN) IV, oxyCODONE, traZODone  Current Labs: reviewed   Physical Exam:  Blood pressure 128/60, pulse (!) 109, temperature 98.2 F (36.8 C), temperature source Oral, resp. rate 16, height 5' 3.25" (1.607 m), weight 124.1 kg, SpO2 100%. NAD RRR no rub Coarse bs b/l 2+ LEE, pitting, symmetrical Nonfocal, CN2-12 intact S/nt/nd R chest tube in place  A AKI on  CKD3, now anuric and rapidly rising SCr probably reflects toradol + fluid shifts from draining pleuarl effusion; suspect ATN at baseline.  SFLC pending to eval for recurrence of MM.  [place] negative earlier.  Has had fliud challenge since admit.  Had polyuria earlier in admission.  Loculated parapneumonic effusion s/p chext tube and pleurodesis, per CCM/TCTS Hyponatremia: excess free water with low GFR, needs tight fluid restriction.  Hx/o MM and autologous SCT at [facility] 2022 Hypocalcemia, corrects towards 8 with hypoalbuminemia HTN: BPs stable today  P 1.2L Fluid restrict Rpt RFP in PM Stop all NSAIDs Check CK Might req HD, will use PM labs to guide [place] on timing if needed Consider foley, was urinating well with purewick and [place] was negative earlier Medication Issues; Preferred narcotic agents for pain control are hydromorphone, fentanyl, and methadone. Morphine should not be used.  Baclofen should be avoided Avoid oral sodium phosphate and magnesium citrate based laxatives / bowel preps    Sabra Heck MD 10/09/2022, 12:46 PM  Recent Labs  Lab 10/07/22 0422 10/08/22 0414 10/09/22 0429  NA 133* 131* 123*  K 4.1 3.4* 4.0  CL 105 97* 89*  CO2 15* 19* 12*  GLUCOSE 98 142* 105*  BUN 44* 48* 67*  CREATININE 4.68* 4.78* 6.37*  CALCIUM 8.2* 7.8* 6.2*  PHOS 4.7*  --   --    Recent Labs  Lab 10/05/22  1130 10/06/22 0437 10/08/22 0414 10/08/22 2018 10/09/22 0429  WBC 6.7   < > 7.8 9.2 5.7  NEUTROABS 5.3  --   --   --   --   HGB 11.6*   < > 12.8 12.2 11.5*  HCT 35.2*   < > 39.1 36.5 34.5*  MCV 102.0*   < > 102.4* 98.1 101.2*  PLT 154   < > 148* 174 132*   < > = values in this interval not displayed.

## 2022-10-09 NOTE — Plan of Care (Signed)
  Problem: Education: Goal: Knowledge of General Education information will improve Description: Including pain rating scale, medication(s)/side effects and non-pharmacologic comfort measures Outcome: Progressing   Problem: Health Behavior/Discharge Planning: Goal: Ability to manage health-related needs will improve Outcome: Progressing   Problem: Education: Goal: Knowledge of General Education information will improve Description: Including pain rating scale, medication(s)/side effects and non-pharmacologic comfort measures Outcome: Progressing   Problem: Health Behavior/Discharge Planning: Goal: Ability to manage health-related needs will improve Outcome: Progressing   Problem: Clinical Measurements: Goal: Ability to maintain clinical measurements within normal limits will improve Outcome: Progressing Goal: Will remain free from infection Outcome: Progressing Goal: Diagnostic test results will improve Outcome: Progressing Goal: Respiratory complications will improve Outcome: Progressing Goal: Cardiovascular complication will be avoided Outcome: Progressing   

## 2022-10-09 NOTE — Progress Notes (Signed)
Critical Ca+ 6.1 and Na+ 119. Notified Dr. Hanley Ben and Dr. Marisue Humble. Pt aware that fluid restriction has been changed to 1000 ml. Pt ambulated to bathroom and voided 400 ml. MD aware. Pt resting with call bell within reach.  Will continue to monitor.

## 2022-10-09 NOTE — Progress Notes (Signed)
Dr. Antionette Char ws made aware that pt sleeps on/off. resp rate 25-27. BP=126/81. HR=188. Pt sounds more coarse than at beginning of shift. Sounds gorgling standing at bedside. 950 ml sanguinous drainage from chest tube. Pt has no urine output since 11am, and she feels like shehas to void but can't. Bladder scan was done 3 times and showed zero urine in bladder. Pt was cool and clammy now okay.

## 2022-10-09 NOTE — Progress Notes (Signed)
Mobility Specialist Progress Note:   10/09/22 1100  Oxygen Therapy  O2 Device Nasal Cannula  O2 Flow Rate (L/min) 3 L/min  Mobility  Activity Ambulated with assistance in hallway  Level of Assistance Contact guard assist, steadying assist  Assistive Device Front wheel walker  Distance Ambulated (ft) 120 ft  Activity Response Tolerated well  Mobility Referral Yes  $Mobility charge 1 Mobility  Mobility Specialist Start Time (ACUTE ONLY) 1111  Mobility Specialist Stop Time (ACUTE ONLY) 1136  Mobility Specialist Time Calculation (min) (ACUTE ONLY) 25 min    Pre Mobility: 107 HR, 100% SpO2 During Mobility: 120 HR, 99% SpO2 Post Mobility:  119 HR,  99% SpO2  Pt received in bed, agreeable to mobility. Husband volunteered chair follow, but can probably ambulate without one. C/o some fatigue, taking x1 seated rest break. Stated it "felt good to be able to walk and breath well". Pt left in bed with call bell and family present.  Stacey Montoya Mobility Specialist Please contact via Special educational needs teacher or Rehab office at [phone]

## 2022-10-09 NOTE — Progress Notes (Signed)
Dr. Antionette Char was made aware that wiith a different bladder scanner, urine in bladder was zero. It turns out that the last document UOP was on 10/08/22 at 0530 in the morning.  Dr. Aliene Altes said  that pt is in in renal failure. Nephrology is following. There isn't anything else for [place] to do overnight if there's no urine in her bladder

## 2022-10-09 NOTE — Progress Notes (Signed)
      [address]       [place] [address]             [phone]       Subjective: Feels better this morning  Objective: Vital signs in last 24 hours: Temp:  [97.7 F (36.5 C)-98.8 F (37.1 C)] 98.2 F (36.8 C) (08/23 0758) Pulse Rate:  [109-123] 109 (08/23 0758) Cardiac Rhythm: Sinus tachycardia (08/22 1900) Resp:  [14-25] 16 (08/23 0758) BP: (104-128)/(60-84) 128/60 (08/23 0758) SpO2:  [91 %-100 %] 100 % (08/23 0758) FiO2 (%):  [50 %] 50 % (08/22 1050) Weight:  [124.1 kg-125.4 kg] 124.1 kg (08/23 0617)  Hemodynamic parameters for last 24 hours:    Intake/Output from previous day: 08/22 0701 - 08/23 0700 In: 1835.7 [P.O.:480; I.V.:1095.7; IV Piggyback:250] Out: 3060 [Chest Tube:3060] Intake/Output this shift: No intake/output data recorded.  General appearance: alert, cooperative, and no distress Serous drainage in tubing  Lab Results: Recent Labs    10/08/22 2018 10/09/22 0429  WBC 9.2 5.7  HGB 12.2 11.5*  HCT 36.5 34.5*  PLT 174 132*   BMET:  Recent Labs    10/08/22 0414 10/09/22 0429  NA 131* 123*  K 3.4* 4.0  CL 97* 89*  CO2 19* 12*  GLUCOSE 142* 105*  BUN 48* 67*  CREATININE 4.78* 6.37*  CALCIUM 7.8* 6.2*    PT/INR: No results for input(s): "LABPROT", "INR" in the last 72 hours. ABG    Component Value Date/Time   HCO3 22.5 10/08/2022 0414   ACIDBASEDEF 5.8 (H) 10/08/2022 0414   O2SAT 36.4 10/08/2022 0414   CBG (last 3)  No results for input(s): "GLUCAP" in the last 72 hours.  Assessment/Plan: Pneumonia with para-mnemonic effusion Had significant pain with thrombolytics yesterday, but had a great result with about 3L out. CXR significantly improved from yesterday AM I don't see any need for surgery currently as she is responding. I would try thrombolytics again today as she still has some loculated effusion- would pretreat for pain.  LOS: 4 days    Loreli Slot 10/09/2022

## 2022-10-10 ENCOUNTER — Inpatient Hospital Stay (HOSPITAL_COMMUNITY): Payer: 59

## 2022-10-10 DIAGNOSIS — N179 Acute kidney failure, unspecified: Secondary | ICD-10-CM

## 2022-10-10 DIAGNOSIS — J918 Pleural effusion in other conditions classified elsewhere: Secondary | ICD-10-CM | POA: Diagnosis not present

## 2022-10-10 DIAGNOSIS — J90 Pleural effusion, not elsewhere classified: Secondary | ICD-10-CM | POA: Diagnosis not present

## 2022-10-10 DIAGNOSIS — J189 Pneumonia, unspecified organism: Secondary | ICD-10-CM

## 2022-10-10 LAB — CBC
HCT: 24 % — ABNORMAL LOW (ref 36.0–46.0)
Hemoglobin: 8.2 g/dL — ABNORMAL LOW (ref 12.0–15.0)
MCH: 32.9 pg (ref 26.0–34.0)
MCHC: 34.2 g/dL (ref 30.0–36.0)
MCV: 96.4 fL (ref 80.0–100.0)
Platelets: 109 10*3/uL — ABNORMAL LOW (ref 150–400)
RBC: 2.49 MIL/uL — ABNORMAL LOW (ref 3.87–5.11)
RDW: 16.9 % — ABNORMAL HIGH (ref 11.5–15.5)
WBC: 2.3 10*3/uL — ABNORMAL LOW (ref 4.0–10.5)
nRBC: 0 % (ref 0.0–0.2)

## 2022-10-10 LAB — BLOOD GAS, VENOUS
Acid-base deficit: 5.8 mmol/L — ABNORMAL HIGH (ref 0.0–2.0)
Bicarbonate: 22.5 mmol/L (ref 20.0–28.0)
O2 Saturation: 36.4 %
Patient temperature: 37
pCO2, Ven: 55 mmHg (ref 44–60)
pH, Ven: 7.22 — ABNORMAL LOW (ref 7.25–7.43)
pO2, Ven: <31 mmHg — CL (ref 32–45)

## 2022-10-10 LAB — MAGNESIUM: Magnesium: 1.7 mg/dL (ref 1.7–2.4)

## 2022-10-10 LAB — COMPREHENSIVE METABOLIC PANEL
ALT: 61 U/L — ABNORMAL HIGH (ref 0–44)
AST: 43 U/L — ABNORMAL HIGH (ref 15–41)
Albumin: 1.8 g/dL — ABNORMAL LOW (ref 3.5–5.0)
Alkaline Phosphatase: 117 U/L (ref 38–126)
Anion gap: 18 — ABNORMAL HIGH (ref 5–15)
BUN: 79 mg/dL — ABNORMAL HIGH (ref 6–20)
CO2: 14 mmol/L — ABNORMAL LOW (ref 22–32)
Calcium: 6 mg/dL — CL (ref 8.9–10.3)
Chloride: 89 mmol/L — ABNORMAL LOW (ref 98–111)
Creatinine, Ser: 7.75 mg/dL — ABNORMAL HIGH (ref 0.44–1.00)
GFR, Estimated: 6 mL/min — ABNORMAL LOW (ref >60)
Glucose, Bld: 96 mg/dL (ref 70–99)
Potassium: 4 mmol/L (ref 3.5–5.1)
Sodium: 121 mmol/L — ABNORMAL LOW (ref 135–145)
Total Bilirubin: 1 mg/dL (ref 0.3–1.2)
Total Protein: 4.9 g/dL — ABNORMAL LOW (ref 6.5–8.1)

## 2022-10-10 LAB — BODY FLUID CULTURE W GRAM STAIN: Culture: NO GROWTH

## 2022-10-10 LAB — CALCIUM, IONIZED: Calcium, Ionized, Serum: 3.1 mg/dL — ABNORMAL LOW (ref 4.5–5.6)

## 2022-10-10 LAB — HEPATITIS B SURFACE ANTIGEN: Hepatitis B Surface Ag: NONREACTIVE

## 2022-10-10 MED ORDER — HEPARIN SODIUM (PORCINE) 1000 UNIT/ML IJ SOLN
3000.0000 [IU] | Freq: Once | INTRAMUSCULAR | Status: AC
  Administered 2022-10-10: 2400 [IU] via INTRAVENOUS
  Filled 2022-10-10: qty 3

## 2022-10-10 MED ORDER — BUDESONIDE 0.5 MG/2ML IN SUSP
0.5000 mg | Freq: Two times a day (BID) | RESPIRATORY_TRACT | Status: DC
  Administered 2022-10-10 – 2022-10-19 (×18): 0.5 mg via RESPIRATORY_TRACT
  Filled 2022-10-10 (×18): qty 2

## 2022-10-10 MED ORDER — HEPARIN SODIUM (PORCINE) 1000 UNIT/ML IJ SOLN
INTRAMUSCULAR | Status: AC
  Filled 2022-10-10: qty 4

## 2022-10-10 MED ORDER — ARFORMOTEROL TARTRATE 15 MCG/2ML IN NEBU
15.0000 ug | INHALATION_SOLUTION | Freq: Two times a day (BID) | RESPIRATORY_TRACT | Status: DC
  Administered 2022-10-10 – 2022-10-19 (×18): 15 ug via RESPIRATORY_TRACT
  Filled 2022-10-10 (×18): qty 2

## 2022-10-10 MED ORDER — REVEFENACIN 175 MCG/3ML IN SOLN
175.0000 ug | Freq: Every day | RESPIRATORY_TRACT | Status: DC
  Administered 2022-10-11 – 2022-10-19 (×9): 175 ug via RESPIRATORY_TRACT
  Filled 2022-10-10 (×9): qty 3

## 2022-10-10 MED ORDER — ALBUTEROL SULFATE (2.5 MG/3ML) 0.083% IN NEBU: 2.5000 mg | INHALATION_SOLUTION | RESPIRATORY_TRACT | Status: DC | PRN

## 2022-10-10 MED ORDER — CHLORHEXIDINE GLUCONATE CLOTH 2 % EX PADS
6.0000 | MEDICATED_PAD | Freq: Every day | CUTANEOUS | Status: DC
  Administered 2022-10-10 – 2022-10-19 (×10): 6 via TOPICAL

## 2022-10-10 NOTE — Progress Notes (Signed)
      [address]       [place] [address]             [phone]        Subjective: Tolerated thrombolytics better yesterday than the day before Less output- 160 recorded but RN noted another 500 ml at shift change this AM  Objective: Vital signs in last 24 hours: Temp:  [97.7 F (36.5 C)-98.5 F (36.9 C)] 98.2 F (36.8 C) (08/24 0725) Pulse Rate:  [102-110] 109 (08/24 0725) Cardiac Rhythm: Sinus tachycardia (08/24 0700) Resp:  [14-20] 17 (08/24 0725) BP: (112-136)/(48-71) 112/67 (08/24 0725) SpO2:  [98 %-100 %] 100 % (08/24 0725) Weight:  [123.2 kg] 123.2 kg (08/24 0500)  Hemodynamic parameters for last 24 hours:    Intake/Output from previous day: 08/23 0701 - 08/24 0700 In: 150 [P.O.:120] Out: 860 [Urine:700; Chest Tube:160] Intake/Output this shift: Total I/O In: -  Out: 500 [Chest Tube:500]  General appearance: alert, cooperative, and no distress Neurologic: intact Bllody fluid in pleural catheter  Lab Results: Recent Labs    10/09/22 0429 10/10/22 0608  WBC 5.7 2.3*  HGB 11.5* 8.2*  HCT 34.5* 24.0*  PLT 132* 109*   BMET:  Recent Labs    10/09/22 1552 10/10/22 0434  NA 119* 121*  K 3.8 4.0  CL 89* 89*  CO2 14* 14*  GLUCOSE 93 96  BUN 77* 79*  CREATININE 6.97* 7.75*  CALCIUM 6.1* 6.0*    PT/INR: No results for input(s): "LABPROT", "INR" in the last 72 hours. ABG    Component Value Date/Time   HCO3 22.5 10/08/2022 0414   ACIDBASEDEF 5.8 (H) 10/08/2022 0414   O2SAT 36.4 10/08/2022 0414   CBG (last 3)  No results for input(s): "GLUCAP" in the last 72 hours.  Assessment/Plan:  Pneumonia complicated by parapneumonic effusion CXR relatively stable- impossible to tell how much is residual fluid vs consolidated lung on plain film- will order CT chest without contrast to evaluate Acute renal failure- creatinine continues to rise- for temp HD cath today   LOS: 5 days    Loreli Slot 10/10/2022

## 2022-10-10 NOTE — Plan of Care (Signed)

## 2022-10-10 NOTE — Progress Notes (Signed)
Chest tube:  [place] noted full and changed w/o difficulty.  Approximate output .  Attending notified.  CCM paged

## 2022-10-10 NOTE — Progress Notes (Signed)
F/u cxr reviewed HD cath in good position Plan Ok to use HD cath for dialysis   [name] ACNP-BC [facility] Pulmonary/Critical Care Pager # [phone] OR # [phone] if no answer

## 2022-10-10 NOTE — Progress Notes (Signed)
Admit: 10/05/2022 LOS: 5  20F with AKI after presenting with loculated parapneumonic effusion, hx/o autologous SCT for MM  Subjective:  0.7L UOP SCr cont to worsen Remains hyponatremic at 121 Rec 2nd dose of TPA into pleural space yesterday SFLC negative CK not very elevated  08/23 0701 - 08/24 0700 In: 150 [P.O.:120] Out: 860 [Urine:700; Chest Tube:160]  Filed Weights   10/09/22 0500 10/09/22 0617 10/10/22 0500  Weight: 125.4 kg 124.1 kg 123.2 kg    Scheduled Meds:  acyclovir  400 mg Oral BID   docusate sodium  100 mg Oral BID   fluticasone furoate-vilanterol  1 puff Inhalation Daily   gabapentin  100 mg Oral TID   medroxyPROGESTERone  10 mg Oral Daily   metoprolol tartrate  12.5 mg Oral BID   nicotine  21 mg Transdermal Q24H   pantoprazole  40 mg Oral Daily   potassium chloride SA  20 mEq Oral BID   pravastatin  40 mg Oral Daily   sodium chloride flush  10 mL Intrapleural Q8H   Continuous Infusions:  cefTRIAXone (ROCEPHIN)  IV 2 g (10/09/22 1857)   doxycycline (VIBRAMYCIN) IV 100 mg (10/10/22 0601)   methocarbamol (ROBAXIN) IV 500 mg (10/10/22 0528)   PRN Meds:.acetaminophen **OR** acetaminophen, albuterol, feeding supplement (GLUCERNA SHAKE), HYDROmorphone (DILAUDID) injection, ondansetron **OR** ondansetron (ZOFRAN) IV, oxyCODONE, traZODone  Current Labs: reviewed   Physical Exam:  Blood pressure 112/67, pulse (!) 109, temperature 98.2 F (36.8 C), temperature source Oral, resp. rate 17, height 5' 3.25" (1.607 m), weight 123.2 kg, SpO2 100%. NAD RRR no rub Coarse bs b/l 2+ LEE, pitting, symmetrical Nonfocal, CN2-12 intact S/nt/nd R chest tube in place  A AKI on CKD3, now oliguric and rapidly rising SCr probably reflects toradol + fluid shifts from draining pleuarl effusion; suspect ATN at baseline.  SFLC negative.  [place] negative earlier.  Has had fliud challenge since admit.  Had polyuria earlier in admission.  Loculated parapneumonic effusion s/p chest tube  and pleurodesis, per CCM/TCTS Hyponatremia: excess free water with low GFR, needs tight fluid restriction.  Hx/o MM and autologous SCT at [facility] 2022 Hypocalcemia, corrects towards 8 with hypoalbuminemia HTN: BPs stable today  P Needs HD with dec UOP and worsened labs IR consulted for temp HD cath today HD #1 thereafter, patient in agreement 1L Fluid restrict No NSAIDs Stop standing KCl for now Daily weights, Daily Renal Panel, Strict I/Os, Avoid nephrotoxins (NSAIDs, judicious IV Contrast)  Medication Issues; Preferred narcotic agents for pain control are hydromorphone, fentanyl, and methadone. Morphine should not be used.  Baclofen should be avoided Avoid oral sodium phosphate and magnesium citrate based laxatives / bowel preps    Sabra Heck MD 10/10/2022, 8:38 AM  Recent Labs  Lab 10/07/22 0422 10/08/22 0414 10/09/22 0429 10/09/22 1552 10/10/22 0434  NA 133*   < > 123* 119* 121*  K 4.1   < > 4.0 3.8 4.0  CL 105   < > 89* 89* 89*  CO2 15*   < > 12* 14* 14*  GLUCOSE 98   < > 105* 93 96  BUN 44*   < > 67* 77* 79*  CREATININE 4.68*   < > 6.37* 6.97* 7.75*  CALCIUM 8.2*   < > 6.2* 6.1* 6.0*  PHOS 4.7*  --   --  8.5*  --    < > = values in this interval not displayed.   Recent Labs  Lab 10/05/22 1130 10/06/22 1914 10/08/22 2018 10/09/22 0429 10/10/22 7829  WBC 6.7   < > 9.2 5.7 2.3*  NEUTROABS 5.3  --   --   --   --   HGB 11.6*   < > 12.2 11.5* 8.2*  HCT 35.2*   < > 36.5 34.5* 24.0*  MCV 102.0*   < > 98.1 101.2* 96.4  PLT 154   < > 174 132* 109*   < > = values in this interval not displayed.

## 2022-10-10 NOTE — Procedures (Signed)
Central Venous Catheter Insertion Procedure Note  Stacey Montoya  [id number]  09-15-1975  Date:10/10/22  Time:2:31 PM   Provider Performing:[name]   Procedure: Insertion of Non-tunneled Central Venous Catheter(36556)with US guidance (56213)    Indication(s) Hemodialysis  Consent Risks of the procedure as well as the alternatives and risks of each were explained to the patient and/or caregiver.  Consent for the procedure was obtained and is signed in the bedside chart  Anesthesia Topical only with 1% lidocaine   Timeout Verified patient identification, verified procedure, site/side was marked, verified correct patient position, special equipment/implants available, medications/allergies/relevant history reviewed, required imaging and test results available.  Sterile Technique Maximal sterile technique including full sterile barrier drape, hand hygiene, sterile gown, sterile gloves, mask, hair covering, sterile ultrasound probe cover (if used).  Procedure Description Area of catheter insertion was cleaned with chlorhexidine and draped in sterile fashion.   With real-time ultrasound guidance a HD catheter was placed into the right internal jugular vein.  Nonpulsatile blood flow and easy flushing noted in all ports.  The catheter was sutured in place and sterile dressing applied.  Complications/Tolerance None; patient tolerated the procedure well. Chest X-ray is ordered to verify placement for internal jugular or subclavian cannulation.  Chest x-ray is not ordered for femoral cannulation.  EBL Minimal  Specimen(s) None   [name] ACNP-BC [facility] Pulmonary/Critical Care Pager # [phone] OR # [phone] if no answer

## 2022-10-10 NOTE — Progress Notes (Signed)
NAME:  Stacey Montoya, MRN:  [id number], DOB:  [birth year], LOS: 5 ADMISSION DATE:  10/05/2022, CONSULTATION DATE:  8/19 REFERRING MD:  Kirby Crigler , CHIEF COMPLAINT:  complex right pleural effusion    History of Present Illness:  47 yo female presented with Rt sided pleuritic chest pain, productive cough, wheezing and dyspnea.  Had fever 101F with chills and body aches.  CT chest showed loculated Rt effusion with RML and RLL ASD.  Started on ABx.  IR attempted thoracentesis but had multiple loculations.  PCCM consulted to assist with pleural fluid management.  She has hx of Stage 3 multiple myeloma s/p chemotherapy and autologous stem cell transplant in August 2022, and currently managed with revlimid and zometa.  She has hx of hypercalcemia and had acute renal failure in May 2024.  Pertinent  Medical History  Multiple Myeloma, Hypercalcemia, DVT, HTN, HLD, Vaping  Significant Hospital Events: Including procedures, antibiotic start and stop dates in addition to other pertinent events   8/19 admitted w/ PNA and complex right pleural effusion. ABX started. Right sided thoracentesis exudate removed. Volume limited due to septations and loculation of pleural fluid. PCCM asked to eval  8/20 right sided small bore CT placed. Initially 90cc concentrated yellow pleural fluid; AKI >> nephrology consulted 8/21 1st dose of tpa/dnase; thoracic surgery consulted 8/22 2nd dose of tpa/dnase 8/23 3rd dose of tpa/dnase 8/24 insert HD catheter and start iHD  Interim History / Subjective:  760 ml out from chest tube over past 24 hours.  Denies chest pain.  Still feels wheezy.  Objective   Blood pressure 132/75, pulse (!) 116, temperature 98.2 F (36.8 C), temperature source Oral, resp. rate 15, height 5' 3.25" (1.607 m), weight 123.2 kg, SpO2 100%.        Intake/Output Summary (Last 24 hours) at 10/10/2022 1127 Last data filed at 10/10/2022 1000 Gross per 24 hour  Intake 150 ml  Output 1360 ml   Net -1210 ml   Filed Weights   10/09/22 0500 10/09/22 0617 10/10/22 0500  Weight: 125.4 kg 124.1 kg 123.2 kg    Physical Exam:  General - alert Eyes - pupils reactive ENT - no sinus tenderness, no stridor Cardiac - regular rate/rhythm, no murmur Chest - decreased BS on Rt, Rt chest tube in place Abdomen - soft, non tender, + bowel sounds Extremities - 1+ edema Skin - no rashes Neuro - normal strength, moves extremities, follows commands Psych - normal mood and behavior   Resolved Hospital Problem list    Assessment & Plan:   Community acquired pneumonia with complex right pleural effusion. - day 6 of ABx - continue chest tube - f/u CT chest - thoracic surgery consulted - defer additional pleurolytic therapy at this time  Wheezing with hx of tobacco abuse and vaping. - yupelri, brovana, pulmicort - prn albuterol - outpt PFTs  AKI. CKD 3. Hyponatremia. Non-gap metabolic acidosis. - place HD catheter and start iHD - f/u BMET - nephrology still hopeful for eventual renal recovery  Hx of multiple myeloma. - followed by Dr. Candise Che as an outpt  Hx of DVT. - hold eliquis for now until procedures are done  Updated her husband at bedside.  Labs:      Latest Ref Rng & Units 10/10/2022    4:34 AM 10/09/2022    3:52 PM 10/09/2022    4:29 AM  CMP  Glucose 70 - 99 mg/dL 96  93  621   BUN 6 - 20 mg/dL 79  77  67   Creatinine 0.44 - 1.00 mg/dL 4.09  8.11  9.14   Sodium 135 - 145 mmol/L 121  119  123   Potassium 3.5 - 5.1 mmol/L 4.0  3.8  4.0   Chloride 98 - 111 mmol/L 89  89  89   CO2 22 - 32 mmol/L 14  14  12    Calcium 8.9 - 10.3 mg/dL 6.0  6.1  6.2  C  Total Protein 6.5 - 8.1 g/dL 4.9   5.0   Total Bilirubin 0.3 - 1.2 mg/dL 1.0   1.2   Alkaline Phos 38 - 126 U/L 117   111   AST 15 - 41 U/L 43   45   ALT 0 - 44 U/L 61   78     C Corrected result      Latest Ref Rng & Units 10/10/2022    6:08 AM 10/09/2022    4:29 AM 10/08/2022    8:18 PM  CBC  WBC 4.0 -  10.5 K/uL 2.3  5.7  9.2   Hemoglobin 12.0 - 15.0 g/dL 8.2  78.2  95.6   Hematocrit 36.0 - 46.0 % 24.0  34.5  36.5   Platelets 150 - 400 K/uL 109  132  174    Signature:  Coralyn Helling, MD [facility] Pager - [phone] or [phone] 10/10/2022, 11:39 AM

## 2022-10-10 NOTE — Progress Notes (Signed)
PROGRESS NOTE    Stacey Montoya  XBM:[id number] DOB: Feb 15, 1976 DOA: 10/05/2022 PCP: Benita Stabile, MD   Brief Narrative:  47 year old with history of hypertension, hyperlipidemia, multiple myeloma status post chemotherapy now in remission, DVT on Eliquis presented to the emergency room with 3 days of worsening shortness of breath and she was found to have right-sided upper lobe pulmonary consolidation, loculated pleural effusion along with AKI on CKD stage IIIa.  She was started on antibiotics.  She underwent right chest tube placement by pulmonary on 10/06/2022.  Nephrology and cardiothoracic surgery were also consulted.  She was transferred to [facility] for possibility of need of VATS by cardiothoracic surgery.  Assessment & Plan:   Acute respiratory failure with hypoxia Community-acquired right-sided pneumonia with parapneumonic effusion -Pulmonary and CT surgery following.  Patient transferred  to [facility] for possibility of need of VATS by cardiothoracic surgery.  CT surgery on 10/09/2022 recommended that patient does not need VATS at this time. -Chest tube currently being managed by pulmonary.  Patient also getting pleural fibrinolytics as per pulmonary. -Currently on Rocephin and doxycycline. -Respiratory status improving.  Currently on 2 L oxygen via nasal cannula.  Wean off as able.  Acute kidney injury on on CKD stage IIIa Acute metabolic acidosis -Baseline creatinine of 1.13-1.22 -Unclear cause.  Nephrology following.  Off IV fluids.  Creatinine worsened to 7.75 today. -Monitor. -Strict input and output.  Thrombocytopenia -Questionable cause.  Monitor.  No signs of bleeding.  Hypokalemia -Improved  Hyponatremia -Sodium worsening.  Monitor.  Follow nephrology recommendations  Multiple myeloma -Revlimid to be on hold till reevaluation by Dr. Candise Che as an outpatient as per his recommendations.  Abnormal LFTs -Hepatitis panel negative.  Right upper  quadrant ultrasound showed hepatic steatosis.  LFTs trending down.  Monitor  History of DVT -Eliquis on hold for now.  Resume if cleared by consultants  Morbid obesity -Outpatient follow-up  DVT prophylaxis: SCDs Code Status: Full Family Communication: Husband at bedside Disposition Plan: Status is: Inpatient Remains inpatient appropriate because: Of severity of illness    Consultants: Pulmonary/nephrology/cardiothoracic surgery  Procedures: As above  Antimicrobials:  Anti-infectives (From admission, onward)    Start     Dose/Rate Route Frequency Ordered Stop   10/05/22 2200  acyclovir (ZOVIRAX) tablet 400 mg        400 mg Oral 2 times daily 10/05/22 1640     10/05/22 2000  cefTRIAXone (ROCEPHIN) 2 g in sodium chloride 0.9 % 100 mL IVPB        2 g 200 mL/hr over 30 Minutes Intravenous Daily-1800 10/05/22 1909     10/05/22 1800  doxycycline (VIBRAMYCIN) 100 mg in sodium chloride 0.9 % 250 mL IVPB        100 mg 125 mL/hr over 120 Minutes Intravenous Every 12 hours 10/05/22 1647 10/10/22 1759   10/05/22 1645  levofloxacin (LEVAQUIN) IVPB 500 mg  Status:  Discontinued        500 mg 100 mL/hr over 60 Minutes Intravenous Every 24 hours 10/05/22 1641 10/05/22 1641   10/05/22 1645  Levofloxacin (LEVAQUIN) IVPB 250 mg  Status:  Discontinued        250 mg 50 mL/hr over 60 Minutes Intravenous Every 24 hours 10/05/22 1641 10/05/22 1647        Subjective: Patient seen and examined at bedside.  No fever, vomiting, worsening abdominal pain reported.  Breathing is improving but still short of breath with exertion. Objective: Vitals:   10/10/22 0405 10/10/22  0500 10/10/22 0502 10/10/22 0725  BP: 129/71   112/67  Pulse: (!) 106 (!) 107 (!) 108 (!) 109  Resp: 18 16 15 17   Temp: 98.2 F (36.8 C)     TempSrc: Oral     SpO2: 100% 98% 100% 100%  Weight:  123.2 kg    Height:        Intake/Output Summary (Last 24 hours) at 10/10/2022 0813 Last data filed at 10/10/2022 0405 Gross  per 24 hour  Intake 150 ml  Output 860 ml  Net -710 ml   Filed Weights   10/09/22 0500 10/09/22 0617 10/10/22 0500  Weight: 125.4 kg 124.1 kg 123.2 kg    Examination:  General: On 2 L oxygen via nasal cannula.  No distress ENT/neck: No thyromegaly.  JVD is not elevated  respiratory: Decreased breath sounds at bases bilaterally with some crackles; no wheezing.  Has right-sided chest tube.   CVS: S1-S2 heard, tachycardic intermittently  abdominal: Soft, morbidly obese, nontender, slightly distended; no organomegaly, bowel sounds are heard Extremities: Trace lower extremity edema; no cyanosis  CNS: Awake and alert.  No focal neurologic deficit.  Moves extremities Lymph: No obvious lymphadenopathy Skin: No obvious ecchymosis/lesions  psych: Affect, judgment and mood are normal  musculoskeletal: No obvious joint swelling/deformity     Data Reviewed: I have personally reviewed following labs and imaging studies  CBC: Recent Labs  Lab 10/05/22 1130 10/06/22 0437 10/08/22 0414 10/08/22 2018 10/09/22 0429 10/10/22 0608  WBC 6.7 6.9 7.8 9.2 5.7 2.3*  NEUTROABS 5.3  --   --   --   --   --   HGB 11.6* 11.0* 12.8 12.2 11.5* 8.2*  HCT 35.2* 34.1* 39.1 36.5 34.5* 24.0*  MCV 102.0* 103.0* 102.4* 98.1 101.2* 96.4  PLT 154 147* 148* 174 132* 109*   Basic Metabolic Panel: Recent Labs  Lab 10/07/22 0422 10/08/22 0414 10/09/22 0429 10/09/22 1552 10/10/22 0434  NA 133* 131* 123* 119* 121*  K 4.1 3.4* 4.0 3.8 4.0  CL 105 97* 89* 89* 89*  CO2 15* 19* 12* 14* 14*  GLUCOSE 98 142* 105* 93 96  BUN 44* 48* 67* 77* 79*  CREATININE 4.68* 4.78* 6.37* 6.97* 7.75*  CALCIUM 8.2* 7.8* 6.2* 6.1* 6.0*  MG  --   --   --   --  1.7  PHOS 4.7*  --   --  8.5*  --    GFR: Estimated Creatinine Clearance: 11.7 mL/min (A) (by C-G formula based on SCr of 7.75 mg/dL (H)). Liver Function Tests: Recent Labs  Lab 10/05/22 1130 10/07/22 0422 10/08/22 0414 10/09/22 0429 10/09/22 1552  10/10/22 0434  AST 78*  --  54* 45*  --  43*  ALT 171*  --  132* 78*  --  61*  ALKPHOS 135*  --  190* 111  --  117  BILITOT 1.3*  --  1.4* 1.2  --  1.0  PROT 7.3  --  6.2* 5.0*  --  4.9*  ALBUMIN 3.1* 2.6* 2.6* 2.0* 1.8* 1.8*   No results for input(s): "LIPASE", "AMYLASE" in the last 168 hours. No results for input(s): "AMMONIA" in the last 168 hours. Coagulation Profile: No results for input(s): "INR", "PROTIME" in the last 168 hours. Cardiac Enzymes: Recent Labs  Lab 10/09/22 0429  CKTOTAL 386*   BNP (last 3 results) No results for input(s): "PROBNP" in the last 8760 hours. HbA1C: No results for input(s): "HGBA1C" in the last 72 hours. CBG: No results for input(s): "  GLUCAP" in the last 168 hours. Lipid Profile: No results for input(s): "CHOL", "HDL", "LDLCALC", "TRIG", "CHOLHDL", "LDLDIRECT" in the last 72 hours. Thyroid Function Tests: No results for input(s): "TSH", "T4TOTAL", "FREET4", "T3FREE", "THYROIDAB" in the last 72 hours. Anemia Panel: No results for input(s): "VITAMINB12", "FOLATE", "FERRITIN", "TIBC", "IRON", "RETICCTPCT" in the last 72 hours. Sepsis Labs: No results for input(s): "PROCALCITON", "LATICACIDVEN" in the last 168 hours.  Recent Results (from the past 240 hour(s))  Resp panel by RT-PCR (RSV, Flu A&B, Covid) Anterior Nasal Swab     Status: None   Collection Time: 10/05/22 11:18 AM   Specimen: Anterior Nasal Swab  Result Value Ref Range Status   SARS Coronavirus 2 by RT PCR NEGATIVE NEGATIVE Final    Comment: (NOTE) SARS-CoV-2 target nucleic acids are NOT DETECTED.  The SARS-CoV-2 RNA is generally detectable in upper respiratory specimens during the acute phase of infection. The lowest concentration of SARS-CoV-2 viral copies this assay can detect is 138 copies/mL. A negative result does not preclude SARS-Cov-2 infection and should not be used as the sole basis for treatment or other patient management decisions. A negative result may occur with   improper specimen collection/handling, submission of specimen other than nasopharyngeal swab, presence of viral mutation(s) within the areas targeted by this assay, and inadequate number of viral copies(<138 copies/mL). A negative result must be combined with clinical observations, patient history, and epidemiological information. The expected result is Negative.  Fact Sheet for Patients:  BloggerCourse.com  Fact Sheet for Healthcare Providers:  SeriousBroker.it  This test is no t yet approved or cleared by the [place] FDA and  has been authorized for detection and/or diagnosis of SARS-CoV-2 by FDA under an Emergency Use Authorization (EUA). This EUA will remain  in effect (meaning this test can be used) for the duration of the COVID-19 declaration under Section 564(b)(1) of the Act, 21 U.S.C.section 360bbb-3(b)(1), unless the authorization is terminated  or revoked sooner.       Influenza A by PCR NEGATIVE NEGATIVE Final   Influenza B by PCR NEGATIVE NEGATIVE Final    Comment: (NOTE) The Xpert Xpress SARS-CoV-2/FLU/RSV plus assay is intended as an aid in the diagnosis of influenza from Nasopharyngeal swab specimens and should not be used as a sole basis for treatment. Nasal washings and aspirates are unacceptable for Xpert Xpress SARS-CoV-2/FLU/RSV testing.  Fact Sheet for Patients: BloggerCourse.com  Fact Sheet for Healthcare Providers: SeriousBroker.it  This test is not yet approved or cleared by the [place] FDA and has been authorized for detection and/or diagnosis of SARS-CoV-2 by FDA under an Emergency Use Authorization (EUA). This EUA will remain in effect (meaning this test can be used) for the duration of the COVID-19 declaration under Section 564(b)(1) of the Act, 21 U.S.C. section 360bbb-3(b)(1), unless the authorization is terminated or revoked.      Resp Syncytial Virus by PCR NEGATIVE NEGATIVE Final    Comment: (NOTE) Fact Sheet for Patients: BloggerCourse.com  Fact Sheet for Healthcare Providers: SeriousBroker.it  This test is not yet approved or cleared by the [place] FDA and has been authorized for detection and/or diagnosis of SARS-CoV-2 by FDA under an Emergency Use Authorization (EUA). This EUA will remain in effect (meaning this test can be used) for the duration of the COVID-19 declaration under Section 564(b)(1) of the Act, 21 U.S.C. section 360bbb-3(b)(1), unless the authorization is terminated or revoked.  Performed at [facility], [address] [place], [place] [address]   Body  fluid culture w Gram Stain     Status: None (Preliminary result)   Collection Time: 10/05/22  4:55 PM   Specimen: Pleura; Body Fluid  Result Value Ref Range Status   Specimen Description   Final    PLEURAL Performed at [facility], [address] [place], [place] [address]    Special Requests   Final    NONE Performed at [facility], [address] [place], [place] [address]    Gram Stain   Final    CYTOSPIN SMEAR WBC PRESENT, PREDOMINANTLY PMN NO ORGANISMS SEEN    Culture   Final    NO GROWTH 2 DAYS Performed at [facility], [address], [place], [place] [address]    Report Status PENDING  Incomplete  Culture, blood (Routine X 2) w Reflex to ID Panel     Status: None (Preliminary result)   Collection Time: 10/06/22  8:26 AM   Specimen: BLOOD  Result Value Ref Range Status   Specimen Description   Final    BLOOD BLOOD LEFT HAND Performed at [facility], [address] [place], [place] [address]    Special Requests   Final    BOTTLES DRAWN AEROBIC ONLY Blood Culture adequate volume Performed at [facility], [address] [place], [place] [address]     Culture   Final    NO GROWTH 3 DAYS Performed at [facility], [address], [place], [place] [address]    Report Status PENDING  Incomplete  Culture, blood (Routine X 2) w Reflex to ID Panel     Status: None (Preliminary result)   Collection Time: 10/06/22  8:26 AM   Specimen: BLOOD  Result Value Ref Range Status   Specimen Description   Final    BLOOD BLOOD LEFT ARM Performed at [facility], [address] [place], [place] [address]    Special Requests   Final    BOTTLES DRAWN AEROBIC AND ANAEROBIC Blood Culture adequate volume Performed at [facility], [address], [place], [place] [address]    Culture   Final    NO GROWTH 3 DAYS Performed at [facility], [address], [place], [place] [address]    Report Status PENDING  Incomplete  Expectorated Sputum Assessment w Gram Stain, Rflx to Resp Cult     Status: None   Collection Time: 10/06/22  8:50 AM   Specimen: Sputum  Result Value Ref Range Status   Specimen Description SPUTUM  Final   Special Requests NONE  Final   Sputum evaluation   Final    THIS SPECIMEN IS ACCEPTABLE FOR SPUTUM CULTURE Performed at [facility], [address] [place], [place] [address]    Report Status 10/06/2022 FINAL  Final  Culture, Respiratory w Gram Stain     Status: None   Collection Time: 10/06/22  8:50 AM   Specimen: SPU  Result Value Ref Range Status   Specimen Description   Final    SPUTUM Performed at [facility], [address], [place], [place] [address]    Special Requests   Final    NONE Reflexed from [id number] Performed at [facility], [address], [place], [place] [address]    Gram Stain   Final    RARE WBC PRESENT,BOTH PMN AND MONONUCLEAR MODERATE GRAM POSITIVE COCCI IN PAIRS FEW GRAM NEGATIVE RODS  Culture   Final    FEW Normal respiratory flora-no Staph aureus or Pseudomonas seen Performed at [facility], [address], [place], [place] [address]    Report Status 10/08/2022 FINAL  Final         Radiology Studies: DG Chest Port 1 View  Result Date: 10/09/2022 CLINICAL DATA:  Chest tube in place. EXAM: PORTABLE CHEST 1 VIEW COMPARISON:  October 08, 2022. FINDINGS: The heart size and mediastinal contours are within normal limits. Right-sided chest tube is unchanged in position. No pneumothorax is noted. Mild to moderate right pleural effusion is noted which is slightly decreased compared to prior exam, with associated right basilar atelectasis. Left lung is unremarkable. Stable expansile lesions are noted in the ribs bilaterally consistent with history of multiple myeloma. IMPRESSION: Stable position of right-sided chest tube. Right pleural effusion is slightly decreased in size. Electronically Signed   By: Lupita Raider M.D.   On: 10/09/2022 09:04   DG CHEST PORT 1 VIEW  Result Date: 10/08/2022 CLINICAL DATA:  Chest pain EXAM: PORTABLE CHEST 1 VIEW COMPARISON:  Chest x-ray 10/05/2022.  Chest CT 10/07/2022. FINDINGS: Right pleural drainage catheter is present. There is a small to moderate-sized right pleural effusion which has decreased. Underlying right basilar airspace disease appears stable. No pneumothorax. The cardiomediastinal silhouette is stable. Osseous structures are unchanged. IMPRESSION: 1. Right pleural drainage catheter in place with a small to moderate-sized right pleural effusion which has decreased. 2. Stable underlying right basilar airspace disease. Electronically Signed   By: Darliss Cheney M.D.   On: 10/08/2022 19:13        Scheduled Meds:  acyclovir  400 mg Oral BID   docusate sodium  100 mg Oral BID   fluticasone furoate-vilanterol  1 puff Inhalation Daily   gabapentin  100 mg Oral TID   medroxyPROGESTERone  10 mg Oral Daily   metoprolol tartrate  12.5 mg Oral BID   nicotine  21 mg Transdermal Q24H   pantoprazole  40 mg Oral Daily   potassium chloride  SA  20 mEq Oral BID   pravastatin  40 mg Oral Daily   sodium chloride flush  10 mL Intrapleural Q8H   Continuous Infusions:  cefTRIAXone (ROCEPHIN)  IV 2 g (10/09/22 1857)   doxycycline (VIBRAMYCIN) IV 100 mg (10/10/22 0601)   methocarbamol (ROBAXIN) IV 500 mg (10/10/22 0528)          Glade Lloyd, MD [facility] 10/10/2022, 8:13 AM

## 2022-10-10 NOTE — Plan of Care (Addendum)
IR was requested for temporary HD cath placement.   IR aware, unclear when this can be done.   Risks and benefits discussed with the patient including, but not limited to bleeding, infection, vascular injury, pneumothorax which may require chest tube placement, air embolism or even death  All of the patient's questions were answered, patient is agreeable to proceed. Consent signed and in chart.  Local only, pt can have diet from IR stand point.   Please call IR for questions and concerns.   ADDENDUM Notified that the temp cath will be placed by PCCM.  IR order d/c'd.   Lynann Bologna Shayde Gervacio PA-C 10/10/2022 9:21 AM

## 2022-10-11 DIAGNOSIS — J918 Pleural effusion in other conditions classified elsewhere: Secondary | ICD-10-CM | POA: Diagnosis not present

## 2022-10-11 DIAGNOSIS — J90 Pleural effusion, not elsewhere classified: Secondary | ICD-10-CM | POA: Diagnosis not present

## 2022-10-11 LAB — PREPARE RBC (CROSSMATCH)

## 2022-10-11 LAB — CBC
HCT: 18.5 % — ABNORMAL LOW (ref 36.0–46.0)
HCT: 21.3 % — ABNORMAL LOW (ref 36.0–46.0)
Hemoglobin: 6.4 g/dL — CL (ref 12.0–15.0)
Hemoglobin: 7.2 g/dL — ABNORMAL LOW (ref 12.0–15.0)
MCH: 33.2 pg (ref 26.0–34.0)
MCH: 31.3 pg (ref 26.0–34.0)
MCHC: 34.6 g/dL (ref 30.0–36.0)
MCHC: 33.8 g/dL (ref 30.0–36.0)
MCV: 95.9 fL (ref 80.0–100.0)
MCV: 92.6 fL (ref 80.0–100.0)
Platelets: 94 10*3/uL — ABNORMAL LOW (ref 150–400)
Platelets: 92 10*3/uL — ABNORMAL LOW (ref 150–400)
RBC: 1.93 MIL/uL — ABNORMAL LOW (ref 3.87–5.11)
RBC: 2.3 MIL/uL — ABNORMAL LOW (ref 3.87–5.11)
RDW: 17 % — ABNORMAL HIGH (ref 11.5–15.5)
RDW: 18.6 % — ABNORMAL HIGH (ref 11.5–15.5)
WBC: 3.2 10*3/uL — ABNORMAL LOW (ref 4.0–10.5)
WBC: 4.1 10*3/uL (ref 4.0–10.5)
nRBC: 0 % (ref 0.0–0.2)
nRBC: 0 % (ref 0.0–0.2)

## 2022-10-11 LAB — CULTURE, BLOOD (ROUTINE X 2)
Culture: NO GROWTH
Culture: NO GROWTH
Special Requests: ADEQUATE
Special Requests: ADEQUATE

## 2022-10-11 LAB — BASIC METABOLIC PANEL
Anion gap: 16 — ABNORMAL HIGH (ref 5–15)
BUN: 56 mg/dL — ABNORMAL HIGH (ref 6–20)
CO2: 18 mmol/L — ABNORMAL LOW (ref 22–32)
Calcium: 6.4 mg/dL — CL (ref 8.9–10.3)
Chloride: 93 mmol/L — ABNORMAL LOW (ref 98–111)
Creatinine, Ser: 6.21 mg/dL — ABNORMAL HIGH (ref 0.44–1.00)
GFR, Estimated: 8 mL/min — ABNORMAL LOW (ref >60)
Glucose, Bld: 90 mg/dL (ref 70–99)
Potassium: 3.3 mmol/L — ABNORMAL LOW (ref 3.5–5.1)
Sodium: 127 mmol/L — ABNORMAL LOW (ref 135–145)

## 2022-10-11 LAB — GLUCOSE, CAPILLARY: Glucose-Capillary: 104 mg/dL — ABNORMAL HIGH (ref 70–99)

## 2022-10-11 MED ORDER — POTASSIUM CHLORIDE CRYS ER 10 MEQ PO TBCR
10.0000 meq | EXTENDED_RELEASE_TABLET | Freq: Once | ORAL | Status: AC
  Administered 2022-10-11: 10 meq via ORAL
  Filled 2022-10-11: qty 1

## 2022-10-11 MED ORDER — SODIUM CHLORIDE 0.9% IV SOLUTION: Freq: Once | INTRAVENOUS | Status: AC

## 2022-10-11 MED ORDER — CALCIUM GLUCONATE-NACL 1-0.675 GM/50ML-% IV SOLN
1.0000 g | Freq: Once | INTRAVENOUS | Status: AC
  Administered 2022-10-11: 1000 mg via INTRAVENOUS
  Filled 2022-10-11: qty 50

## 2022-10-11 NOTE — Progress Notes (Signed)
Pt received in 4E01. Telemetry applied, CCMD notified with second verifier.  Skin and focused assessment completed.  Pt oriented to room and call bell.  No immediate needs at this time.

## 2022-10-11 NOTE — Progress Notes (Signed)
Hgb down to 6.4 this morning. No overt bleeding. Plan to transfuse 1 unit RBCs.

## 2022-10-11 NOTE — Progress Notes (Signed)
  Informed consent signed and in chart.   TX duration:2.5  Patient tolerated well.  Alert, confused at this present time.  Hand-off given to patient's nurse.   Access used: catheter Access issues: none  Total UF removed: 1000 ml Medication(s) given: none Post HD VS: 117/73 Post HD weight: unable to obtain     10/11/22 0125  Vitals  Temp 97.6 F (36.4 C)  Temp Source Oral  BP 104/85  MAP (mmHg) 93  BP Location Left Arm  BP Method Automatic  Patient Position (if appropriate) Lying  Pulse Rate (!) 106  Pulse Rate Source Monitor  ECG Heart Rate (!) 106  Resp 11  Oxygen Therapy  SpO2 98 %  O2 Device Nasal Cannula  O2 Flow Rate (L/min) 2 L/min  Patient Activity (if Appropriate) In bed  Pulse Oximetry Type Continuous  During Treatment Monitoring  Blood Flow Rate (mL/min) 250 mL/min  Arterial Pressure (mmHg) -81.81 mmHg  Venous Pressure (mmHg) 73.53 mmHg  TMP (mmHg) 23.84 mmHg  Ultrafiltration Rate (mL/min) 510 mL/min  Dialysate Flow Rate (mL/min) 300 ml/min  Dialysate Potassium Concentration 3  Dialysate Calcium Concentration 2.5  Duration of HD Treatment -hour(s) 2.5 hour(s)  Cumulative Fluid Removed (mL) per Treatment  998.89  Intra-Hemodialysis Comments Tx completed;Tolerated well  Dialysis Fluid Bolus Normal Saline  Bolus Amount (mL) 300 mL  Dialysate Change 3K;2.5 Ca  Post Treatment  Dialyzer Clearance Lightly streaked  Hemodialysis Intake (mL) 0 mL  Liters Processed 37.5  Fluid Removed (mL) 1000 mL  Tolerated HD Treatment Yes  Post-Hemodialysis Comments HD tx chieved as expected, tolerated well, no complaints.  AVG/AVF Arterial Site Held (minutes) 0 minutes  AVG/AVF Venous Site Held (minutes) 0 minutes  Note  Patient Observations pt is in bed resting  Hemodialysis Catheter Right Internal jugular  Placement Date/Time: 10/10/22 1445   Placed prior to admission: No  Time Out: Correct patient  Maximum sterile barrier precautions: Hand  hygiene;Cap;Mask;Sterile gown;Sterile probe cover;Large sterile sheet;Sterile gloves  Site Prep: Chlorhexidine (pr...  Site Condition No complications  Blue Lumen Status Flushed;Heparin locked;Dead end cap in place  Red Lumen Status Flushed;Heparin locked;Dead end cap in place  Purple Lumen Status N/A  Catheter fill solution Heparin 1000 units/ml  Catheter fill volume (Arterial) 1.2 cc  Catheter fill volume (Venous) 1.2  Dressing Type Transparent  Dressing Status Antimicrobial disc in place  Drainage Description None  Post treatment catheter status Capped and Clamped

## 2022-10-11 NOTE — Progress Notes (Signed)
Patient transferred from [facility] to 4E01 via bed. Receiving RN aware and at bedside, patient being placed on monitor now.

## 2022-10-11 NOTE — Progress Notes (Addendum)
Pt ambulated in hall approx 100' on RA with front wheel walker.  Tolerated fair. Stopped to rest once.  Back to recliner in room, call bell in reach and family at bedside.

## 2022-10-11 NOTE — Progress Notes (Signed)
Admit: 10/05/2022 LOS: 6  4F with AKI after presenting with loculated parapneumonic effusion, hx/o autologous SCT for MM  Subjective:  R internal jugular Temp HD cath yesterday with CCM, greatly appreciate the help HD #1 yesterday evening, 1L UF; tolerated well Made 1L UOP as well  08/24 0701 - 08/25 0700 In: 2132.1 [P.O.:1380; I.V.:20; Blood:315; IV Piggyback:397.1] Out: 2730 [Urine:1000; Chest Tube:730]  Filed Weights   10/09/22 0617 10/10/22 0500 10/11/22 0500  Weight: 124.1 kg 123.2 kg 120.7 kg    Scheduled Meds:  sodium chloride   Intravenous Once   acyclovir  400 mg Oral BID   arformoterol  15 mcg Nebulization BID   budesonide (PULMICORT) nebulizer solution  0.5 mg Nebulization BID   Chlorhexidine Gluconate Cloth  6 each Topical Daily   docusate sodium  100 mg Oral BID   gabapentin  100 mg Oral TID   heparin sodium (porcine)       medroxyPROGESTERone  10 mg Oral Daily   metoprolol tartrate  12.5 mg Oral BID   nicotine  21 mg Transdermal Q24H   pantoprazole  40 mg Oral Daily   pravastatin  40 mg Oral Daily   revefenacin  175 mcg Nebulization Daily   sodium chloride flush  10 mL Intrapleural Q8H   Continuous Infusions:  cefTRIAXone (ROCEPHIN)  IV Stopped (10/10/22 1819)   methocarbamol (ROBAXIN) IV Stopped (10/11/22 0606)   PRN Meds:.acetaminophen **OR** acetaminophen, albuterol, feeding supplement (GLUCERNA SHAKE), heparin sodium (porcine), HYDROmorphone (DILAUDID) injection, ondansetron **OR** ondansetron (ZOFRAN) IV, oxyCODONE, traZODone  Current Labs: reviewed   Physical Exam:  Blood pressure 127/65, pulse (!) 103, temperature 98.3 F (36.8 C), temperature source Oral, resp. rate 14, height 5' 3.25" (1.607 m), weight 120.7 kg, SpO2 98%. NAD RRR no rub Coarse bs b/l 2+ LEE, pitting, symmetrical Nonfocal, CN2-12 intact S/nt/nd R chest tube in place  A AKI on CKD3, oliguric and rapidly rising SCr probably reflects toradol + fluid shifts from draining  pleuarl effusion; suspect ATN at baseline.  SFLC negative.  [place] negative earlier.  Has had fliud challenge since admit.  Had polyuria earlier in admission.  R internal jugular Temp HD cath 8/24 with CCM HD #1 10/10/22 Monitor UOP and change in labs Anticipate HD #2 tomorrow 8/26, check with MD first Loculated parapneumonic effusion s/p chest tube and pleurodesis, per CCM/TCTS; imaging improving Hyponatremia: excess free water with low GFR, needs tight fluid restriction.  Hx/o MM and autologous SCT at [facility] 2022; neg rpt studies here Hypocalcemia, corrects towards 8 with hypoalbuminemia HTN: BPs stable today Anemia: transfuse prn, probable chronic illness + ABLA; ESA defer to oncology  P HD #2 tomorrow: 2.5h, 1L UF, no heparin Wait a few days to see if quick recoveyr or not before tunneling HD cath Cont 1L Fluid restrict No NSAIDs Daily weights, Daily Renal Panel, Strict I/Os, Avoid nephrotoxins (NSAIDs, judicious IV Contrast)  Medication Issues; Preferred narcotic agents for pain control are hydromorphone, fentanyl, and methadone. Morphine should not be used.  Baclofen should be avoided Avoid oral sodium phosphate and magnesium citrate based laxatives / bowel preps    Stacey Heck MD 10/11/2022, 8:53 AM  Recent Labs  Lab 10/07/22 0422 10/08/22 0414 10/09/22 1552 10/10/22 0434 10/11/22 0355  NA 133*   < > 119* 121* 127*  K 4.1   < > 3.8 4.0 3.3*  CL 105   < > 89* 89* 93*  CO2 15*   < > 14* 14* 18*  GLUCOSE 98   < >  93 96 90  BUN 44*   < > 77* 79* 56*  CREATININE 4.68*   < > 6.97* 7.75* 6.21*  CALCIUM 8.2*   < > 6.1* 6.0* 6.4*  PHOS 4.7*  --  8.5*  --   --    < > = values in this interval not displayed.   Recent Labs  Lab 10/05/22 1130 10/06/22 0437 10/09/22 0429 10/10/22 0608 10/11/22 0355  WBC 6.7   < > 5.7 2.3* 3.2*  NEUTROABS 5.3  --   --   --   --   HGB 11.6*   < > 11.5* 8.2* 6.4*  HCT 35.2*   < > 34.5* 24.0* 18.5*  MCV 102.0*   < > 101.2* 96.4 95.9  PLT 154   <  > 132* 109* 94*   < > = values in this interval not displayed.

## 2022-10-11 NOTE — Progress Notes (Signed)
PROGRESS NOTE    Stacey Montoya  OZH:[id number] DOB: 04-Feb-1976 DOA: 10/05/2022 PCP: Benita Stabile, MD   Brief Narrative:  47 year old with history of hypertension, hyperlipidemia, multiple myeloma status post chemotherapy now in remission, DVT on Eliquis presented to the emergency room with 3 days of worsening shortness of breath and she was found to have right-sided upper lobe pulmonary consolidation, loculated pleural effusion along with AKI on CKD stage IIIa.  She was started on antibiotics.  She underwent right chest tube placement by pulmonary on 10/06/2022.  Nephrology and cardiothoracic surgery were also consulted.  She was transferred to [facility] for possibility of need of VATS by cardiothoracic surgery.  Subsequently, renal function worsened and she had insertion of nontunneled CVC catheter placed by PCCM on 10/10/2022.  Hemodialysis started on 10/10/2022.  Assessment & Plan:   Acute respiratory failure with hypoxia Community-acquired right-sided pneumonia with parapneumonic effusion -Pulmonary and CT surgery following.  Patient transferred  to [facility] for possibility of need of VATS by cardiothoracic surgery.  CT surgery on 10/09/2022 recommended that patient does not need VATS at this time. -Chest tube currently being managed by pulmonary.  Patient also getting pleural fibrinolytics as per pulmonary. -Currently on Rocephin.  Completed doxycycline. -Respiratory status improving.  Currently on 2 L oxygen via nasal cannula.  Wean off as able.  Acute kidney injury on on CKD stage IIIa Acute metabolic acidosis -Baseline creatinine of 1.13-1.22 -Unclear cause.  Nephrology following.  Off IV fluids.  Creatinine worsened to 7.75 on 10/10/2022. -Monitor. -Strict input and output. -she had insertion of nontunneled CVC catheter placed by PCCM on 10/10/2022.  Hemodialysis started on 10/10/2022.  Anemia of chronic disease -From renal failure and chronic illnesses.   Hemoglobin dropped to 6.4 this morning.  Transfuse 1 unit of packed red cells.  Monitor H&H.  Thrombocytopenia -Questionable cause.  Monitor.  No signs of bleeding.  Leukopenia -Questionable cause.  Mild.  Hypokalemia -Mild.  Monitor.  Hyponatremia -Improving.  Monitor.  Follow nephrology recommendations.  Multiple myeloma -Revlimid to be on hold till reevaluation by Dr. Candise Che as an outpatient as per his recommendations.  Abnormal LFTs -Hepatitis panel negative.  Right upper quadrant ultrasound showed hepatic steatosis.  LFTs trending down.  Monitor  History of DVT -Eliquis on hold for now.  Resume if cleared by consultants  Morbid obesity -Outpatient follow-up  DVT prophylaxis: SCDs Code Status: Full Family Communication: Husband at bedside Disposition Plan: Status is: Inpatient Remains inpatient appropriate because: Of severity of illness    Consultants: Pulmonary/nephrology/cardiothoracic surgery  Procedures: As above  Antimicrobials:  Anti-infectives (From admission, onward)    Start     Dose/Rate Route Frequency Ordered Stop   10/05/22 2200  acyclovir (ZOVIRAX) tablet 400 mg        400 mg Oral 2 times daily 10/05/22 1640     10/05/22 2000  cefTRIAXone (ROCEPHIN) 2 g in sodium chloride 0.9 % 100 mL IVPB        2 g 200 mL/hr over 30 Minutes Intravenous Daily-1800 10/05/22 1909     10/05/22 1800  doxycycline (VIBRAMYCIN) 100 mg in sodium chloride 0.9 % 250 mL IVPB        100 mg 125 mL/hr over 120 Minutes Intravenous Every 12 hours 10/05/22 1647 10/10/22 1759   10/05/22 1645  levofloxacin (LEVAQUIN) IVPB 500 mg  Status:  Discontinued        500 mg 100 mL/hr over 60 Minutes Intravenous Every 24 hours 10/05/22 1641  10/05/22 1641   10/05/22 1645  Levofloxacin (LEVAQUIN) IVPB 250 mg  Status:  Discontinued        250 mg 50 mL/hr over 60 Minutes Intravenous Every 24 hours 10/05/22 1641 10/05/22 1647        Subjective: Patient seen and examined at bedside.   Still short of breath with exertion.  No chest pain, fever, vomiting reported.   Objective: Vitals:   10/11/22 0630 10/11/22 0645 10/11/22 0700 10/11/22 0701  BP: 135/72 126/73 135/69 135/69  Pulse: (!) 109 (!) 111 (!) 107 (!) 107  Resp: 12 12 13 13   Temp: 98.1 F (36.7 C)  98.4 F (36.9 C) 98.4 F (36.9 C)  TempSrc: Oral  Oral   SpO2: 100% 99% 98%   Weight:      Height:        Intake/Output Summary (Last 24 hours) at 10/11/2022 4742 Last data filed at 10/11/2022 0700 Gross per 24 hour  Intake 2055 ml  Output 2730 ml  Net -675 ml   Filed Weights   10/09/22 0617 10/10/22 0500 10/11/22 0500  Weight: 124.1 kg 123.2 kg 120.7 kg    Examination:  General: No acute distress.  Still on 2 L oxygen via nasal cannula.  ENT/neck: No JVD elevation or palpable neck masses noted respiratory: Bilateral decreased breath sounds at bases with scattered crackles; right-sided chest tube present. CVS: Intermittent tachycardia present; S1 and S2 are heard abdominal: Soft, morbidly obese, nontender, distended mildly; no organomegaly, bowel sounds are heard normally Extremities: No clubbing; mild lower extremity edema present CNS: Sleepy, wakes up slightly.  No focal neurologic deficit.  Moving extremities Lymph: No palpable adenopathy Skin: No obvious rashes/petechiae psych: Mostly flat affect.  Not agitated. musculoskeletal: No obvious joint tenderness/deformity    Data Reviewed: I have personally reviewed following labs and imaging studies  CBC: Recent Labs  Lab 10/05/22 1130 10/06/22 0437 10/08/22 0414 10/08/22 2018 10/09/22 0429 10/10/22 0608 10/11/22 0355  WBC 6.7   < > 7.8 9.2 5.7 2.3* 3.2*  NEUTROABS 5.3  --   --   --   --   --   --   HGB 11.6*   < > 12.8 12.2 11.5* 8.2* 6.4*  HCT 35.2*   < > 39.1 36.5 34.5* 24.0* 18.5*  MCV 102.0*   < > 102.4* 98.1 101.2* 96.4 95.9  PLT 154   < > 148* 174 132* 109* 94*   < > = values in this interval not displayed.   Basic Metabolic  Panel: Recent Labs  Lab 10/07/22 0422 10/08/22 0414 10/09/22 0429 10/09/22 1552 10/10/22 0434 10/11/22 0355  NA 133* 131* 123* 119* 121* 127*  K 4.1 3.4* 4.0 3.8 4.0 3.3*  CL 105 97* 89* 89* 89* 93*  CO2 15* 19* 12* 14* 14* 18*  GLUCOSE 98 142* 105* 93 96 90  BUN 44* 48* 67* 77* 79* 56*  CREATININE 4.68* 4.78* 6.37* 6.97* 7.75* 6.21*  CALCIUM 8.2* 7.8* 6.2* 6.1* 6.0* 6.4*  MG  --   --   --   --  1.7  --   PHOS 4.7*  --   --  8.5*  --   --    GFR: Estimated Creatinine Clearance: 14.5 mL/min (A) (by C-G formula based on SCr of 6.21 mg/dL (H)). Liver Function Tests: Recent Labs  Lab 10/05/22 1130 10/07/22 0422 10/08/22 0414 10/09/22 0429 10/09/22 1552 10/10/22 0434  AST 78*  --  54* 45*  --  43*  ALT 171*  --  132* 78*  --  61*  ALKPHOS 135*  --  190* 111  --  117  BILITOT 1.3*  --  1.4* 1.2  --  1.0  PROT 7.3  --  6.2* 5.0*  --  4.9*  ALBUMIN 3.1* 2.6* 2.6* 2.0* 1.8* 1.8*   No results for input(s): "LIPASE", "AMYLASE" in the last 168 hours. No results for input(s): "AMMONIA" in the last 168 hours. Coagulation Profile: No results for input(s): "INR", "PROTIME" in the last 168 hours. Cardiac Enzymes: Recent Labs  Lab 10/09/22 0429  CKTOTAL 386*   BNP (last 3 results) No results for input(s): "PROBNP" in the last 8760 hours. HbA1C: No results for input(s): "HGBA1C" in the last 72 hours. CBG: No results for input(s): "GLUCAP" in the last 168 hours. Lipid Profile: No results for input(s): "CHOL", "HDL", "LDLCALC", "TRIG", "CHOLHDL", "LDLDIRECT" in the last 72 hours. Thyroid Function Tests: No results for input(s): "TSH", "T4TOTAL", "FREET4", "T3FREE", "THYROIDAB" in the last 72 hours. Anemia Panel: No results for input(s): "VITAMINB12", "FOLATE", "FERRITIN", "TIBC", "IRON", "RETICCTPCT" in the last 72 hours. Sepsis Labs: No results for input(s): "PROCALCITON", "LATICACIDVEN" in the last 168 hours.  Recent Results (from the past 240 hour(s))  Resp panel by  RT-PCR (RSV, Flu A&B, Covid) Anterior Nasal Swab     Status: None   Collection Time: 10/05/22 11:18 AM   Specimen: Anterior Nasal Swab  Result Value Ref Range Status   SARS Coronavirus 2 by RT PCR NEGATIVE NEGATIVE Final    Comment: (NOTE) SARS-CoV-2 target nucleic acids are NOT DETECTED.  The SARS-CoV-2 RNA is generally detectable in upper respiratory specimens during the acute phase of infection. The lowest concentration of SARS-CoV-2 viral copies this assay can detect is 138 copies/mL. A negative result does not preclude SARS-Cov-2 infection and should not be used as the sole basis for treatment or other patient management decisions. A negative result may occur with  improper specimen collection/handling, submission of specimen other than nasopharyngeal swab, presence of viral mutation(s) within the areas targeted by this assay, and inadequate number of viral copies(<138 copies/mL). A negative result must be combined with clinical observations, patient history, and epidemiological information. The expected result is Negative.  Fact Sheet for Patients:  BloggerCourse.com  Fact Sheet for Healthcare Providers:  SeriousBroker.it  This test is no t yet approved or cleared by the [place] FDA and  has been authorized for detection and/or diagnosis of SARS-CoV-2 by FDA under an Emergency Use Authorization (EUA). This EUA will remain  in effect (meaning this test can be used) for the duration of the COVID-19 declaration under Section 564(b)(1) of the Act, 21 U.S.C.section 360bbb-3(b)(1), unless the authorization is terminated  or revoked sooner.       Influenza A by PCR NEGATIVE NEGATIVE Final   Influenza B by PCR NEGATIVE NEGATIVE Final    Comment: (NOTE) The Xpert Xpress SARS-CoV-2/FLU/RSV plus assay is intended as an aid in the diagnosis of influenza from Nasopharyngeal swab specimens and should not be used as a sole basis  for treatment. Nasal washings and aspirates are unacceptable for Xpert Xpress SARS-CoV-2/FLU/RSV testing.  Fact Sheet for Patients: BloggerCourse.com  Fact Sheet for Healthcare Providers: SeriousBroker.it  This test is not yet approved or cleared by the [place] FDA and has been authorized for detection and/or diagnosis of SARS-CoV-2 by FDA under an Emergency Use Authorization (EUA). This EUA will remain in effect (meaning this test can be used) for the duration of the COVID-19 declaration under Section  564(b)(1) of the Act, 21 U.S.C. section 360bbb-3(b)(1), unless the authorization is terminated or revoked.     Resp Syncytial Virus by PCR NEGATIVE NEGATIVE Final    Comment: (NOTE) Fact Sheet for Patients: BloggerCourse.com  Fact Sheet for Healthcare Providers: SeriousBroker.it  This test is not yet approved or cleared by the [place] FDA and has been authorized for detection and/or diagnosis of SARS-CoV-2 by FDA under an Emergency Use Authorization (EUA). This EUA will remain in effect (meaning this test can be used) for the duration of the COVID-19 declaration under Section 564(b)(1) of the Act, 21 U.S.C. section 360bbb-3(b)(1), unless the authorization is terminated or revoked.  Performed at [facility], [address], [place], [place] [address]   Body fluid culture w Gram Stain     Status: None   Collection Time: 10/05/22  4:55 PM   Specimen: Pleura; Body Fluid  Result Value Ref Range Status   Specimen Description   Final    PLEURAL Performed at [facility], [address] [place], [place] [address]    Special Requests   Final    NONE Performed at [facility], [address] [place], [place] [address]    Gram Stain   Final    CYTOSPIN SMEAR WBC PRESENT, PREDOMINANTLY PMN NO ORGANISMS SEEN     Culture   Final    NO GROWTH 3 DAYS Performed at [facility], [address], [place], [place] [address]    Report Status 10/10/2022 FINAL  Final  Culture, blood (Routine X 2) w Reflex to ID Panel     Status: None (Preliminary result)   Collection Time: 10/06/22  8:26 AM   Specimen: BLOOD  Result Value Ref Range Status   Specimen Description   Final    BLOOD BLOOD LEFT HAND Performed at [facility], [address], [place], [place] [address]    Special Requests   Final    BOTTLES DRAWN AEROBIC ONLY Blood Culture adequate volume Performed at [facility], [address], [place], [place] [address]    Culture   Final    NO GROWTH 4 DAYS Performed at [facility], [address], [place], [place] [address]    Report Status PENDING  Incomplete  Culture, blood (Routine X 2) w Reflex to ID Panel     Status: None (Preliminary result)   Collection Time: 10/06/22  8:26 AM   Specimen: BLOOD  Result Value Ref Range Status   Specimen Description   Final    BLOOD BLOOD LEFT ARM Performed at [facility], [address] [place], [place] [address]    Special Requests   Final    BOTTLES DRAWN AEROBIC AND ANAEROBIC Blood Culture adequate volume Performed at [facility], [address], [place], [place] [address]    Culture   Final    NO GROWTH 4 DAYS Performed at [facility], [address], [place], [place] [address]    Report Status PENDING  Incomplete  Expectorated Sputum Assessment w Gram Stain, Rflx to Resp Cult     Status: None   Collection Time: 10/06/22  8:50 AM   Specimen: Sputum  Result Value Ref Range Status   Specimen Description SPUTUM  Final   Special Requests NONE  Final   Sputum evaluation   Final    THIS SPECIMEN IS ACCEPTABLE FOR SPUTUM CULTURE Performed at [facility], [address], [place], [place] [address]    Report Status 10/06/2022 FINAL   Final  Culture, Respiratory w Gram Stain     Status: None   Collection Time: 10/06/22  8:50 AM   Specimen: SPU  Result Value Ref Range Status   Specimen Description   Final    SPUTUM Performed at [facility], [address] [place], [place] [address]    Special Requests   Final    NONE Reflexed from [phone] [id number] Performed at [facility], [address] [place], [place] [address]    Gram Stain   Final    RARE WBC PRESENT,BOTH PMN AND MONONUCLEAR MODERATE GRAM POSITIVE COCCI IN PAIRS FEW GRAM NEGATIVE RODS    Culture   Final    FEW Normal respiratory flora-no Staph aureus or Pseudomonas seen Performed at [facility], [address] [place], [place] [address]    Report Status 10/08/2022 FINAL  Final         Radiology Studies: CT CHEST WO CONTRAST  Result Date: 10/10/2022 CLINICAL DATA:  Right-sided pleural effusion. Multiple myeloma. * Tracking Code: BO * EXAM: CT CHEST WITHOUT CONTRAST TECHNIQUE: Multidetector CT imaging of the chest was performed following the standard protocol without IV contrast. RADIATION DOSE REDUCTION: This exam was performed according to the departmental dose-optimization program which includes automated exposure control, adjustment of the mA and/or kV according to patient size and/or use of iterative reconstruction technique. COMPARISON:  Chest radiograph on 10/10/2022 FINDINGS: Cardiovascular:  No acute findings. Mediastinum/Nodes: No masses or pathologically enlarged lymph nodes identified on this unenhanced exam. Lungs/Pleura: Pigtail catheter remains in place. Small right pleural effusion has decreased in size. Decreased compressive atelectasis seen in the right middle and lower lobes. Persistent elevation of right hemidiaphragm noted. Focal area of airspace opacity in the left anterior apex shows mild improvement since previous study. No new or worsening areas of pulmonary opacity are seen. No No evidence of  central endobronchial obstruction. Upper Abdomen:  Unremarkable. Musculoskeletal: Stable appearance of lytic bone lesions throughout the thorax, consistent with known history of multiple myeloma. IMPRESSION: Decreased size of small right pleural effusion, with decreased compressive atelectasis in right middle and lower lobes. Mild improvement in focal left apical airspace opacity, consistent with resolving infectious or inflammatory process. Stable appearance of lytic bone lesions throughout the thorax, consistent with known history of multiple myeloma. Electronically Signed   By: Danae Orleans M.D.   On: 10/10/2022 15:32   DG Chest Port 1 View  Result Date: 10/10/2022 CLINICAL DATA:  Central line placement EXAM: PORTABLE CHEST 1 VIEW COMPARISON:  10/10/2022 at 6:32 a.m. FINDINGS: Dual lumen right IJ central line tip: SVC.  No visible pneumothorax. Pigtail right pleural drainage catheter noted with large right pleural effusion. This is stable from earlier today. The left lung remains clear. Scattered abnormal osseous lucencies in the skeleton compatible with myeloma. IMPRESSION: 1. Dual lumen right IJ central line tip: SVC. No visible pneumothorax. 2. Stable large right pleural effusion with pigtail pleural drainage catheter in place. 3. Scattered osseous lucencies compatible with myeloma. Electronically Signed   By: Gaylyn Rong M.D.   On: 10/10/2022 15:25   DG CHEST PORT 1 VIEW  Result Date: 10/10/2022 CLINICAL DATA:  Chest tube in place.  Right pleural effusion. EXAM: PORTABLE CHEST 1 VIEW COMPARISON:  One-view chest x-ray 10/09/2022 FINDINGS: The heart size is normal. Lung volumes are low. A right-sided pigtail catheter is stable in position. The  right pleural effusion is increasing. Associated airspace disease is noted. The left lung is clear. IMPRESSION: 1. Increasing right pleural effusion and airspace disease. 2. Stable right-sided pigtail catheter. Electronically Signed   By: Marin Roberts M.D.   On: 10/10/2022 10:33        Scheduled Meds:  sodium chloride   Intravenous Once   acyclovir  400 mg Oral BID   arformoterol  15 mcg Nebulization BID   budesonide (PULMICORT) nebulizer solution  0.5 mg Nebulization BID   Chlorhexidine Gluconate Cloth  6 each Topical Daily   docusate sodium  100 mg Oral BID   gabapentin  100 mg Oral TID   heparin sodium (porcine)       medroxyPROGESTERone  10 mg Oral Daily   metoprolol tartrate  12.5 mg Oral BID   nicotine  21 mg Transdermal Q24H   pantoprazole  40 mg Oral Daily   pravastatin  40 mg Oral Daily   revefenacin  175 mcg Nebulization Daily   sodium chloride flush  10 mL Intrapleural Q8H   Continuous Infusions:  calcium gluconate 1,000 mg (10/11/22 5284)   cefTRIAXone (ROCEPHIN)  IV Stopped (10/10/22 1819)   methocarbamol (ROBAXIN) IV 500 mg (10/11/22 0536)          Glade Lloyd, MD [facility] 10/11/2022, 7:22 AM

## 2022-10-11 NOTE — Progress Notes (Signed)
      [address]       [place] [address]             [phone]       Subjective: tired  Objective: Vital signs in last 24 hours: Temp:  [97.6 F (36.4 C)-98.4 F (36.9 C)] 98.3 F (36.8 C) (08/25 0809) Pulse Rate:  [102-121] 103 (08/25 0827) Cardiac Rhythm: Sinus tachycardia (08/25 0800) Resp:  [6-27] 14 (08/25 0827) BP: (96-139)/(44-85) 127/65 (08/25 0800) SpO2:  [93 %-100 %] 98 % (08/25 0827) Weight:  [120.7 kg] 120.7 kg (08/25 0500)  Hemodynamic parameters for last 24 hours:    Intake/Output from previous day: 08/24 0701 - 08/25 0700 In: 2132.1 [P.O.:1380; I.V.:20; Blood:315; IV Piggyback:397.1] Out: 2730 [Urine:1000; Chest Tube:730] Intake/Output this shift: Total I/O In: 268 [P.O.:240; IV Piggyback:28] Out: 0   General appearance: cooperative and no distress Neurologic: intact Heart: mildly tachy Lungs: diminished breath sounds right base  Lab Results: Recent Labs    10/10/22 0608 10/11/22 0355  WBC 2.3* 3.2*  HGB 8.2* 6.4*  HCT 24.0* 18.5*  PLT 109* 94*   BMET:  Recent Labs    10/10/22 0434 10/11/22 0355  NA 121* 127*  K 4.0 3.3*  CL 89* 93*  CO2 14* 18*  GLUCOSE 96 90  BUN 79* 56*  CREATININE 7.75* 6.21*  CALCIUM 6.0* 6.4*    PT/INR: No results for input(s): "LABPROT", "INR" in the last 72 hours. ABG    Component Value Date/Time   HCO3 22.5 10/08/2022 0414   ACIDBASEDEF 5.8 (H) 10/08/2022 0414   O2SAT 36.4 10/08/2022 0414   CBG (last 3)  No results for input(s): "GLUCAP" in the last 72 hours.  Assessment/Plan:  Parapneumonic effusion- s/p thrombolytics  CT still draining- ~ 700 ml yesterday  Fluid is blood tinged serous  CT showed good result with significant decrease in pleural fluid- there is minimal residual fluid and some improvement in consolidation.  A considerable part of the opacity on CXR is an elevated right hemidiaphragm  I don't see any need for additional thrombolytics at this point Would  keep tube until drainage < 150/ day    LOS: 6 days    Loreli Slot 10/11/2022

## 2022-10-12 ENCOUNTER — Inpatient Hospital Stay (HOSPITAL_COMMUNITY): Payer: 59

## 2022-10-12 DIAGNOSIS — J90 Pleural effusion, not elsewhere classified: Secondary | ICD-10-CM | POA: Diagnosis not present

## 2022-10-12 LAB — BPAM RBC
Blood Product Expiration Date: 202409042359
ISSUE DATE / TIME: 202408250636
Unit Type and Rh: 6200

## 2022-10-12 LAB — CBC WITH DIFFERENTIAL/PLATELET
Abs Immature Granulocytes: 0.04 10*3/uL (ref 0.00–0.07)
Basophils Absolute: 0 10*3/uL (ref 0.0–0.1)
Basophils Relative: 0 %
Eosinophils Absolute: 0.1 10*3/uL (ref 0.0–0.5)
Eosinophils Relative: 2 %
HCT: 20.8 % — ABNORMAL LOW (ref 36.0–46.0)
Hemoglobin: 7.2 g/dL — ABNORMAL LOW (ref 12.0–15.0)
Immature Granulocytes: 1 %
Lymphocytes Relative: 9 %
Lymphs Abs: 0.4 10*3/uL — ABNORMAL LOW (ref 0.7–4.0)
MCH: 31.7 pg (ref 26.0–34.0)
MCHC: 34.6 g/dL (ref 30.0–36.0)
MCV: 91.6 fL (ref 80.0–100.0)
Monocytes Absolute: 0.4 10*3/uL (ref 0.1–1.0)
Monocytes Relative: 8 %
Neutro Abs: 3.6 10*3/uL (ref 1.7–7.7)
Neutrophils Relative %: 80 %
Platelets: 86 10*3/uL — ABNORMAL LOW (ref 150–400)
RBC: 2.27 MIL/uL — ABNORMAL LOW (ref 3.87–5.11)
RDW: 19.9 % — ABNORMAL HIGH (ref 11.5–15.5)
WBC: 4.5 10*3/uL (ref 4.0–10.5)
nRBC: 0 % (ref 0.0–0.2)

## 2022-10-12 LAB — TYPE AND SCREEN
ABO/RH(D): A POS
Antibody Screen: NEGATIVE
Unit division: 0

## 2022-10-12 LAB — BASIC METABOLIC PANEL
Anion gap: 20 — ABNORMAL HIGH (ref 5–15)
BUN: 78 mg/dL — ABNORMAL HIGH (ref 6–20)
CO2: 16 mmol/L — ABNORMAL LOW (ref 22–32)
Calcium: 6.8 mg/dL — ABNORMAL LOW (ref 8.9–10.3)
Chloride: 93 mmol/L — ABNORMAL LOW (ref 98–111)
Creatinine, Ser: 7.79 mg/dL — ABNORMAL HIGH (ref 0.44–1.00)
GFR, Estimated: 6 mL/min — ABNORMAL LOW (ref >60)
Glucose, Bld: 92 mg/dL (ref 70–99)
Potassium: 3.4 mmol/L — ABNORMAL LOW (ref 3.5–5.1)
Sodium: 129 mmol/L — ABNORMAL LOW (ref 135–145)

## 2022-10-12 LAB — GLUCOSE, CAPILLARY: Glucose-Capillary: 85 mg/dL (ref 70–99)

## 2022-10-12 MED ORDER — ANTICOAGULANT SODIUM CITRATE 4% (200MG/5ML) IV SOLN
5.0000 mL | Status: DC | PRN
  Filled 2022-10-12: qty 5

## 2022-10-12 MED ORDER — HEPARIN SODIUM (PORCINE) 1000 UNIT/ML IJ SOLN
INTRAMUSCULAR | Status: AC
  Administered 2022-10-12: 2400 [IU]
  Filled 2022-10-12: qty 3

## 2022-10-12 MED ORDER — HEPARIN SODIUM (PORCINE) 1000 UNIT/ML DIALYSIS
1000.0000 [IU] | INTRAMUSCULAR | Status: DC | PRN
  Filled 2022-10-12 (×2): qty 1

## 2022-10-12 MED ORDER — ALTEPLASE 2 MG IJ SOLR
2.0000 mg | Freq: Once | INTRAMUSCULAR | Status: DC | PRN
  Filled 2022-10-12: qty 2

## 2022-10-12 NOTE — Procedures (Signed)
HD Note:  Some information was entered later than the data was gathered due to patient care needs. The stated time with the data is accurate.  Received patient in bed to unit.   Alert and oriented.   Informed consent signed and in chart.   Access used: Right internal jugular HD tri lumen catheter Access issues: No issues  Patient tolerated treatment well.   TX duration: 2.5 hours  Transported back to the room   Alert, without acute distress.  Total UF removed: 1000 ml  Hand-off given to patient's nurse.   Liyah Higham L. Dareen Piano, RN [facility].

## 2022-10-12 NOTE — Progress Notes (Signed)
Mobility Specialist Progress Note:   10/12/22 1000  Mobility  Activity Ambulated with assistance in hallway  Level of Assistance Contact guard assist, steadying assist  Assistive Device Front wheel walker  Distance Ambulated (ft) 68 ft  Activity Response Tolerated well  Mobility Referral Yes  $Mobility charge 1 Mobility  Mobility Specialist Start Time (ACUTE ONLY) 1015  Mobility Specialist Stop Time (ACUTE ONLY) 1031  Mobility Specialist Time Calculation (min) (ACUTE ONLY) 16 min    Pre Mobility: 108 HR,  96% SpO2 During Mobility: 127 HR Post Mobility:  115 HR  Pt received in bed, agreeable to mobility. Took x2 standing rest breaks d/t SOB. Otherwise asymptomatic w/ no complaints. Pt left on EOB with call bell and NP present.  D'Vante Earlene Plater Mobility Specialist Please contact via Special educational needs teacher or Rehab office at [phone]

## 2022-10-12 NOTE — Progress Notes (Signed)
Chest tube removed as per order by RN jennifer,pt tolerated the procedure well, vitals taken

## 2022-10-12 NOTE — Progress Notes (Signed)
[facility] NEPHROLOGY PROGRESS NOTE  Assessment/ Plan: Pt is a 47 y.o. yo female  with AKI after presenting with loculated parapneumonic effusion, hx/o autologous SCT for MM   # Acute kidney injury on CKD 3, oliguric and now dialysis dependent.  This is likely ATN due to Toradol, + fluid shift from draining pleural effusion/hemodynamic changes.  UA, US renal unremarkable.  Hep B, hep C, HIV, kappa lambda ratio unremarkable  RIJ temp HD catheter placed on 8/24 by CCM and received first HD. Plan for second dialysis today. Continue daily labs and strict ins and out to monitor renal recovery. I will inform renal navigator to look for outpatient HD for AKI.  # Loculated parapneumonic effusion status post chest tube and pleurodesis per pulmonary team.  # Hx/o MM and autologous SCT at [facility] 2022; neg rpt studies here  # Hyponatremia, hypervolemic: UF with HD.  Continue fluid restriction.  # Hypokalemia: Dialysis with high potassium bath.  # Hypocalcemia: Corrected calcium level is 8.5  # Anemia: Requiring transfusion.  I will check iron level.  # Hypertension/volume: On metoprolol, UF with HD today.  Subjective: Seen and examined at the bedside.  Mild shortness of breath.  Urine output is documented primary CC.  No nausea, vomiting, chest pain. Objective Vital signs in last 24 hours: Vitals:   10/12/22 0444 10/12/22 0756 10/12/22 0844 10/12/22 0916  BP: (!) 152/69 (!) 151/71  (!) 164/85  Pulse:  98  (!) 103  Resp:  18  20  Temp: 98 F (36.7 C) 98.2 F (36.8 C)    TempSrc: Oral Oral    SpO2:  99% 100% 98%  Weight:      Height:       Weight change: 0.5 kg  Intake/Output Summary (Last 24 hours) at 10/12/2022 1011 Last data filed at 10/12/2022 0830 Gross per 24 hour  Intake 500 ml  Output 540 ml  Net -40 ml       Labs: RENAL PANEL Recent Labs  Lab 10/07/22 0422 10/08/22 0414 10/09/22 0429 10/09/22 1552 10/10/22 0434 10/11/22 0355 10/12/22 0623  NA  133* 131* 123* 119* 121* 127* 129*  K 4.1 3.4* 4.0 3.8 4.0 3.3* 3.4*  CL 105 97* 89* 89* 89* 93* 93*  CO2 15* 19* 12* 14* 14* 18* 16*  GLUCOSE 98 142* 105* 93 96 90 92  BUN 44* 48* 67* 77* 79* 56* 78*  CREATININE 4.68* 4.78* 6.37* 6.97* 7.75* 6.21* 7.79*  CALCIUM 8.2* 7.8* 6.2* 6.1* 6.0* 6.4* 6.8*  MG  --   --   --   --  1.7  --   --   PHOS 4.7*  --   --  8.5*  --   --   --   ALBUMIN 2.6* 2.6* 2.0* 1.8* 1.8*  --   --     Liver Function Tests: Recent Labs  Lab 10/08/22 0414 10/09/22 0429 10/09/22 1552 10/10/22 0434  AST 54* 45*  --  43*  ALT 132* 78*  --  61*  ALKPHOS 190* 111  --  117  BILITOT 1.4* 1.2  --  1.0  PROT 6.2* 5.0*  --  4.9*  ALBUMIN 2.6* 2.0* 1.8* 1.8*   No results for input(s): "LIPASE", "AMYLASE" in the last 168 hours. No results for input(s): "AMMONIA" in the last 168 hours. CBC: Recent Labs    10/09/22 0429 10/10/22 0608 10/11/22 0355 10/11/22 1056 10/12/22 0623  HGB 11.5* 8.2* 6.4* 7.2* 7.2*  MCV 101.2* 96.4 95.9  92.6 91.6    Cardiac Enzymes: Recent Labs  Lab 10/09/22 0429  CKTOTAL 386*   CBG: Recent Labs  Lab 10/11/22 2007 10/12/22 0557  GLUCAP 104* 85    Iron Studies: No results for input(s): "IRON", "TIBC", "TRANSFERRIN", "FERRITIN" in the last 72 hours. Studies/Results: CT CHEST WO CONTRAST  Result Date: 10/10/2022 CLINICAL DATA:  Right-sided pleural effusion. Multiple myeloma. * Tracking Code: BO * EXAM: CT CHEST WITHOUT CONTRAST TECHNIQUE: Multidetector CT imaging of the chest was performed following the standard protocol without IV contrast. RADIATION DOSE REDUCTION: This exam was performed according to the departmental dose-optimization program which includes automated exposure control, adjustment of the mA and/or kV according to patient size and/or use of iterative reconstruction technique. COMPARISON:  Chest radiograph on 10/10/2022 FINDINGS: Cardiovascular:  No acute findings. Mediastinum/Nodes: No masses or pathologically  enlarged lymph nodes identified on this unenhanced exam. Lungs/Pleura: Pigtail catheter remains in place. Small right pleural effusion has decreased in size. Decreased compressive atelectasis seen in the right middle and lower lobes. Persistent elevation of right hemidiaphragm noted. Focal area of airspace opacity in the left anterior apex shows mild improvement since previous study. No new or worsening areas of pulmonary opacity are seen. No No evidence of central endobronchial obstruction. Upper Abdomen:  Unremarkable. Musculoskeletal: Stable appearance of lytic bone lesions throughout the thorax, consistent with known history of multiple myeloma. IMPRESSION: Decreased size of small right pleural effusion, with decreased compressive atelectasis in right middle and lower lobes. Mild improvement in focal left apical airspace opacity, consistent with resolving infectious or inflammatory process. Stable appearance of lytic bone lesions throughout the thorax, consistent with known history of multiple myeloma. Electronically Signed   By: Danae Orleans M.D.   On: 10/10/2022 15:32   DG Chest Port 1 View  Result Date: 10/10/2022 CLINICAL DATA:  Central line placement EXAM: PORTABLE CHEST 1 VIEW COMPARISON:  10/10/2022 at 6:32 a.m. FINDINGS: Dual lumen right IJ central line tip: SVC.  No visible pneumothorax. Pigtail right pleural drainage catheter noted with large right pleural effusion. This is stable from earlier today. The left lung remains clear. Scattered abnormal osseous lucencies in the skeleton compatible with myeloma. IMPRESSION: 1. Dual lumen right IJ central line tip: SVC. No visible pneumothorax. 2. Stable large right pleural effusion with pigtail pleural drainage catheter in place. 3. Scattered osseous lucencies compatible with myeloma. Electronically Signed   By: Gaylyn Rong M.D.   On: 10/10/2022 15:25    Medications: Infusions:  cefTRIAXone (ROCEPHIN)  IV 2 g (10/11/22 1650)    Scheduled  Medications:  acyclovir  400 mg Oral BID   arformoterol  15 mcg Nebulization BID   budesonide (PULMICORT) nebulizer solution  0.5 mg Nebulization BID   Chlorhexidine Gluconate Cloth  6 each Topical Daily   docusate sodium  100 mg Oral BID   gabapentin  100 mg Oral TID   medroxyPROGESTERone  10 mg Oral Daily   metoprolol tartrate  12.5 mg Oral BID   nicotine  21 mg Transdermal Q24H   pantoprazole  40 mg Oral Daily   pravastatin  40 mg Oral Daily   revefenacin  175 mcg Nebulization Daily   sodium chloride flush  10 mL Intrapleural Q8H    have reviewed scheduled and prn medications.  Physical Exam: General:NAD, comfortable Heart:RRR, s1s2 nl Lungs: Bibasilar crackles. Abdomen:soft, Non-tender, non-distended Extremities:No edema Dialysis Access: Right IJ temporary HD catheter in place, site clean.  Arlayne Liggins Prasad Tijah Hane 10/12/2022,10:11 AM  LOS: 7 days

## 2022-10-12 NOTE — Plan of Care (Signed)
POC progressing.  

## 2022-10-12 NOTE — Progress Notes (Signed)
PROGRESS NOTE    Stacey Montoya  XBJ:[id number] DOB: 12-Mar-1975 DOA: 10/05/2022 PCP: Benita Stabile, MD   Brief Narrative:  47 year old with history of hypertension, hyperlipidemia, multiple myeloma status post chemotherapy now in remission, DVT on Eliquis presented to the emergency room with 3 days of worsening shortness of breath and she was found to have right-sided upper lobe pulmonary consolidation, loculated pleural effusion along with AKI on CKD stage IIIa.  She was started on antibiotics.  She underwent right chest tube placement by pulmonary on 10/06/2022.  Nephrology and cardiothoracic surgery were also consulted.  She was transferred to [facility] for possibility of need of VATS by cardiothoracic surgery.  Subsequently, renal function worsened and she had insertion of nontunneled CVC catheter placed by PCCM on 10/10/2022.  Hemodialysis started on 10/10/2022.  Assessment & Plan:   Acute respiratory failure with hypoxia Community-acquired right-sided pneumonia with parapneumonic effusion -Pulmonary and CT surgery following.  CT surgery recommending conservative management at this time. -Chest tube currently being managed by pulmonary.  Patient received pleural fibrinolytics as per pulmonary. -Currently on Rocephin.  Completed doxycycline. -Respiratory status improving.  Currently on room air  Acute kidney injury on on CKD stage IIIa Acute metabolic acidosis -Baseline creatinine of 1.13-1.22 -Unclear cause.  Nephrology following.  Off IV fluids.  Creatinine worsened to 7.75 on 10/10/2022. -Monitor. -Strict input and output. -she had insertion of nontunneled CVC catheter placed by PCCM on 10/10/2022.  Hemodialysis started on 10/10/2022.  Anemia of chronic disease -From renal failure and chronic illnesses.  Hemoglobin dropped to 6.4 on 10/11/2022.  Transfused 1 unit of packed red cells.  Hemoglobin 7.2 this morning.  Monitor H&H.  Thrombocytopenia -Questionable cause.  Monitor.   No signs of bleeding.  Leukopenia -Resolved  Hypokalemia -Labs pending today  Hyponatremia -Labs pending today  Multiple myeloma -Revlimid to be on hold till reevaluation by Dr. Candise Che as an outpatient as per his recommendations.  Abnormal LFTs -Hepatitis panel negative.  Right upper quadrant ultrasound showed hepatic steatosis.  LFTs trending down.  Monitor  History of DVT -Eliquis on hold for now.  Resume if cleared by consultants  Morbid obesity -Outpatient follow-up  DVT prophylaxis: SCDs Code Status: Full Family Communication: Husband at bedside Disposition Plan: Status is: Inpatient Remains inpatient appropriate because: Of severity of illness    Consultants: Pulmonary/nephrology/cardiothoracic surgery  Procedures: As above  Antimicrobials:  Anti-infectives (From admission, onward)    Start     Dose/Rate Route Frequency Ordered Stop   10/05/22 2200  acyclovir (ZOVIRAX) tablet 400 mg        400 mg Oral 2 times daily 10/05/22 1640     10/05/22 2000  cefTRIAXone (ROCEPHIN) 2 g in sodium chloride 0.9 % 100 mL IVPB        2 g 200 mL/hr over 30 Minutes Intravenous Daily-1800 10/05/22 1909     10/05/22 1800  doxycycline (VIBRAMYCIN) 100 mg in sodium chloride 0.9 % 250 mL IVPB        100 mg 125 mL/hr over 120 Minutes Intravenous Every 12 hours 10/05/22 1647 10/10/22 1759   10/05/22 1645  levofloxacin (LEVAQUIN) IVPB 500 mg  Status:  Discontinued        500 mg 100 mL/hr over 60 Minutes Intravenous Every 24 hours 10/05/22 1641 10/05/22 1641   10/05/22 1645  Levofloxacin (LEVAQUIN) IVPB 250 mg  Status:  Discontinued        250 mg 50 mL/hr over 60 Minutes Intravenous Every 24 hours 10/05/22  1641 10/05/22 1647        Subjective: Patient seen and examined at bedside.  No fever, abdominal pain, vomiting reported.  Short of breath with mild exertion.   Objective: Vitals:   10/11/22 2102 10/11/22 2346 10/12/22 0334 10/12/22 0444  BP:  133/76  (!) 152/69  Pulse:       Resp:      Temp:  98.5 F (36.9 C)  98 F (36.7 C)  TempSrc:  Oral  Oral  SpO2: 97%     Weight:   121.2 kg   Height:        Intake/Output Summary (Last 24 hours) at 10/12/2022 0729 Last data filed at 10/12/2022 0335 Gross per 24 hour  Intake 1248.81 ml  Output 540 ml  Net 708.81 ml   Filed Weights   10/10/22 0500 10/11/22 0500 10/12/22 0334  Weight: 123.2 kg 120.7 kg 121.2 kg    Examination:  General: On room air.  No distress. ENT/neck: No obvious neck masses or JVD elevation noted  respiratory: Decreased breath sounds at bases bilaterally with some crackles; right-sided chest tube is still present. CVS: S1-S2 heard; rate currently controlled  abdominal: Soft, morbidly obese, nontender, slightly distended; no organomegaly, normal bowel sounds heard  extremities: Trace lower extremity edema present; no cyanosis  CNS: Awake.  No focal deficits noted  lymph: No cervical lymphadenopathy palpable Skin: No obvious lesions/ecchymosis  psych: Currently not agitated.  Flat affect mostly. musculoskeletal: No obvious joint erythema/tenderness   Data Reviewed: I have personally reviewed following labs and imaging studies  CBC: Recent Labs  Lab 10/05/22 1130 10/06/22 0437 10/09/22 0429 10/10/22 0608 10/11/22 0355 10/11/22 1056 10/12/22 0623  WBC 6.7   < > 5.7 2.3* 3.2* 4.1 4.5  NEUTROABS 5.3  --   --   --   --   --  3.6  HGB 11.6*   < > 11.5* 8.2* 6.4* 7.2* 7.2*  HCT 35.2*   < > 34.5* 24.0* 18.5* 21.3* 20.8*  MCV 102.0*   < > 101.2* 96.4 95.9 92.6 91.6  PLT 154   < > 132* 109* 94* 92* 86*   < > = values in this interval not displayed.   Basic Metabolic Panel: Recent Labs  Lab 10/07/22 0422 10/08/22 0414 10/09/22 0429 10/09/22 1552 10/10/22 0434 10/11/22 0355  NA 133* 131* 123* 119* 121* 127*  K 4.1 3.4* 4.0 3.8 4.0 3.3*  CL 105 97* 89* 89* 89* 93*  CO2 15* 19* 12* 14* 14* 18*  GLUCOSE 98 142* 105* 93 96 90  BUN 44* 48* 67* 77* 79* 56*  CREATININE 4.68*  4.78* 6.37* 6.97* 7.75* 6.21*  CALCIUM 8.2* 7.8* 6.2* 6.1* 6.0* 6.4*  MG  --   --   --   --  1.7  --   PHOS 4.7*  --   --  8.5*  --   --    GFR: Estimated Creatinine Clearance: 14.5 mL/min (A) (by C-G formula based on SCr of 6.21 mg/dL (H)). Liver Function Tests: Recent Labs  Lab 10/05/22 1130 10/07/22 0422 10/08/22 0414 10/09/22 0429 10/09/22 1552 10/10/22 0434  AST 78*  --  54* 45*  --  43*  ALT 171*  --  132* 78*  --  61*  ALKPHOS 135*  --  190* 111  --  117  BILITOT 1.3*  --  1.4* 1.2  --  1.0  PROT 7.3  --  6.2* 5.0*  --  4.9*  ALBUMIN 3.1* 2.6* 2.6*  2.0* 1.8* 1.8*   No results for input(s): "LIPASE", "AMYLASE" in the last 168 hours. No results for input(s): "AMMONIA" in the last 168 hours. Coagulation Profile: No results for input(s): "INR", "PROTIME" in the last 168 hours. Cardiac Enzymes: Recent Labs  Lab 10/09/22 0429  CKTOTAL 386*   BNP (last 3 results) No results for input(s): "PROBNP" in the last 8760 hours. HbA1C: No results for input(s): "HGBA1C" in the last 72 hours. CBG: Recent Labs  Lab 10/11/22 2007 10/12/22 0557  GLUCAP 104* 85   Lipid Profile: No results for input(s): "CHOL", "HDL", "LDLCALC", "TRIG", "CHOLHDL", "LDLDIRECT" in the last 72 hours. Thyroid Function Tests: No results for input(s): "TSH", "T4TOTAL", "FREET4", "T3FREE", "THYROIDAB" in the last 72 hours. Anemia Panel: No results for input(s): "VITAMINB12", "FOLATE", "FERRITIN", "TIBC", "IRON", "RETICCTPCT" in the last 72 hours. Sepsis Labs: No results for input(s): "PROCALCITON", "LATICACIDVEN" in the last 168 hours.  Recent Results (from the past 240 hour(s))  Resp panel by RT-PCR (RSV, Flu A&B, Covid) Anterior Nasal Swab     Status: None   Collection Time: 10/05/22 11:18 AM   Specimen: Anterior Nasal Swab  Result Value Ref Range Status   SARS Coronavirus 2 by RT PCR NEGATIVE NEGATIVE Final    Comment: (NOTE) SARS-CoV-2 target nucleic acids are NOT DETECTED.  The SARS-CoV-2  RNA is generally detectable in upper respiratory specimens during the acute phase of infection. The lowest concentration of SARS-CoV-2 viral copies this assay can detect is 138 copies/mL. A negative result does not preclude SARS-Cov-2 infection and should not be used as the sole basis for treatment or other patient management decisions. A negative result may occur with  improper specimen collection/handling, submission of specimen other than nasopharyngeal swab, presence of viral mutation(s) within the areas targeted by this assay, and inadequate number of viral copies(<138 copies/mL). A negative result must be combined with clinical observations, patient history, and epidemiological information. The expected result is Negative.  Fact Sheet for Patients:  BloggerCourse.com  Fact Sheet for Healthcare Providers:  SeriousBroker.it  This test is no t yet approved or cleared by the [place] FDA and  has been authorized for detection and/or diagnosis of SARS-CoV-2 by FDA under an Emergency Use Authorization (EUA). This EUA will remain  in effect (meaning this test can be used) for the duration of the COVID-19 declaration under Section 564(b)(1) of the Act, 21 U.S.C.section 360bbb-3(b)(1), unless the authorization is terminated  or revoked sooner.       Influenza A by PCR NEGATIVE NEGATIVE Final   Influenza B by PCR NEGATIVE NEGATIVE Final    Comment: (NOTE) The Xpert Xpress SARS-CoV-2/FLU/RSV plus assay is intended as an aid in the diagnosis of influenza from Nasopharyngeal swab specimens and should not be used as a sole basis for treatment. Nasal washings and aspirates are unacceptable for Xpert Xpress SARS-CoV-2/FLU/RSV testing.  Fact Sheet for Patients: BloggerCourse.com  Fact Sheet for Healthcare Providers: SeriousBroker.it  This test is not yet approved or cleared by the  [place] FDA and has been authorized for detection and/or diagnosis of SARS-CoV-2 by FDA under an Emergency Use Authorization (EUA). This EUA will remain in effect (meaning this test can be used) for the duration of the COVID-19 declaration under Section 564(b)(1) of the Act, 21 U.S.C. section 360bbb-3(b)(1), unless the authorization is terminated or revoked.     Resp Syncytial Virus by PCR NEGATIVE NEGATIVE Final    Comment: (NOTE) Fact Sheet for Patients: BloggerCourse.com  Fact Sheet for Healthcare Providers:  SeriousBroker.it  This test is not yet approved or cleared by the [place] and has been authorized for detection and/or diagnosis of SARS-CoV-2 by FDA under an Emergency Use Authorization (EUA). This EUA will remain in effect (meaning this test can be used) for the duration of the COVID-19 declaration under Section 564(b)(1) of the Act, 21 U.S.C. section 360bbb-3(b)(1), unless the authorization is terminated or revoked.  Performed at [facility], [address] [place], [place] [address]   Body fluid culture w Gram Stain     Status: None   Collection Time: 10/05/22  4:55 PM   Specimen: Pleura; Body Fluid  Result Value Ref Range Status   Specimen Description   Final    PLEURAL Performed at [facility], [address], [place], [place] [address]    Special Requests   Final    NONE Performed at [facility], [address] [place], [place] [address]    Gram Stain   Final    CYTOSPIN SMEAR WBC PRESENT, PREDOMINANTLY PMN NO ORGANISMS SEEN    Culture   Final    NO GROWTH 3 DAYS Performed at [facility], [address],  [place], [place] [address]    Report Status 10/10/2022 FINAL  Final  Culture, blood (Routine X 2) w Reflex to ID Panel     Status: None   Collection Time: 10/06/22  8:26 AM   Specimen: BLOOD  Result Value Ref Range  Status   Specimen Description   Final    BLOOD BLOOD LEFT HAND Performed at [facility], [address] [place], [place] [address]    Special Requests   Final    BOTTLES DRAWN AEROBIC ONLY Blood Culture adequate volume Performed at [facility], [address] [place], [place] [address]    Culture   Final    NO GROWTH 5 DAYS Performed at [facility], [address], [place], [place] [address]    Report Status 10/11/2022 FINAL  Final  Culture, blood (Routine X 2) w Reflex to ID Panel     Status: None   Collection Time: 10/06/22  8:26 AM   Specimen: BLOOD  Result Value Ref Range Status   Specimen Description   Final    BLOOD BLOOD LEFT ARM Performed at [facility], [address] [place], [place] [address]    Special Requests   Final    BOTTLES DRAWN AEROBIC AND ANAEROBIC Blood Culture adequate volume Performed at [facility], [address], [place], [place] [address]    Culture   Final    NO GROWTH 5 DAYS Performed at [facility], [address] [place], [place] [address]    Report Status 10/11/2022 FINAL  Final  Expectorated Sputum Assessment w Gram Stain, Rflx to Resp Cult     Status: None   Collection Time: 10/06/22  8:50 AM   Specimen: Sputum  Result Value Ref Range Status   Specimen Description SPUTUM  Final   Special Requests NONE  Final   Sputum evaluation   Final    THIS SPECIMEN IS ACCEPTABLE FOR SPUTUM CULTURE Performed at [facility], [address] [place], [place] [address]    Report Status 10/06/2022 FINAL  Final  Culture, Respiratory w Gram Stain     Status: None   Collection Time: 10/06/22  8:50 AM   Specimen: SPU  Result Value Ref  Range Status   Specimen Description   Final    SPUTUM Performed at [facility], [address] [place], [place] [address]    Special Requests   Final    NONE Reflexed from  [id number] Performed at [facility], [address], [place], [place] [address]    Gram Stain   Final    RARE WBC PRESENT,BOTH PMN AND MONONUCLEAR MODERATE GRAM POSITIVE COCCI IN PAIRS FEW GRAM NEGATIVE RODS    Culture   Final    FEW Normal respiratory flora-no Staph aureus or Pseudomonas seen Performed at [facility], [address] [place], [place] [address]    Report Status 10/08/2022 FINAL  Final         Radiology Studies: CT CHEST WO CONTRAST  Result Date: 10/10/2022 CLINICAL DATA:  Right-sided pleural effusion. Multiple myeloma. * Tracking Code: BO * EXAM: CT CHEST WITHOUT CONTRAST TECHNIQUE: Multidetector CT imaging of the chest was performed following the standard protocol without IV contrast. RADIATION DOSE REDUCTION: This exam was performed according to the departmental dose-optimization program which includes automated exposure control, adjustment of the mA and/or kV according to patient size and/or use of iterative reconstruction technique. COMPARISON:  Chest radiograph on 10/10/2022 FINDINGS: Cardiovascular:  No acute findings. Mediastinum/Nodes: No masses or pathologically enlarged lymph nodes identified on this unenhanced exam. Lungs/Pleura: Pigtail catheter remains in place. Small right pleural effusion has decreased in size. Decreased compressive atelectasis seen in the right middle and lower lobes. Persistent elevation of right hemidiaphragm noted. Focal area of airspace opacity in the left anterior apex shows mild improvement since previous study. No new or worsening areas of pulmonary opacity are seen. No No evidence of central endobronchial obstruction. Upper Abdomen:  Unremarkable. Musculoskeletal: Stable appearance of lytic bone lesions throughout the thorax, consistent with known history of multiple myeloma. IMPRESSION: Decreased size of small right pleural effusion, with decreased compressive atelectasis in right middle and lower lobes. Mild  improvement in focal left apical airspace opacity, consistent with resolving infectious or inflammatory process. Stable appearance of lytic bone lesions throughout the thorax, consistent with known history of multiple myeloma. Electronically Signed   By: Danae Orleans M.D.   On: 10/10/2022 15:32   DG Chest Port 1 View  Result Date: 10/10/2022 CLINICAL DATA:  Central line placement EXAM: PORTABLE CHEST 1 VIEW COMPARISON:  10/10/2022 at 6:32 a.m. FINDINGS: Dual lumen right IJ central line tip: SVC.  No visible pneumothorax. Pigtail right pleural drainage catheter noted with large right pleural effusion. This is stable from earlier today. The left lung remains clear. Scattered abnormal osseous lucencies in the skeleton compatible with myeloma. IMPRESSION: 1. Dual lumen right IJ central line tip: SVC. No visible pneumothorax. 2. Stable large right pleural effusion with pigtail pleural drainage catheter in place. 3. Scattered osseous lucencies compatible with myeloma. Electronically Signed   By: Gaylyn Rong M.D.   On: 10/10/2022 15:25        Scheduled Meds:  acyclovir  400 mg Oral BID   arformoterol  15 mcg Nebulization BID   budesonide (PULMICORT) nebulizer solution  0.5 mg Nebulization BID   Chlorhexidine Gluconate Cloth  6 each Topical Daily   docusate sodium  100 mg Oral BID   gabapentin  100 mg Oral TID   medroxyPROGESTERone  10 mg Oral Daily   metoprolol tartrate  12.5 mg Oral BID   nicotine  21 mg Transdermal Q24H   pantoprazole  40 mg Oral Daily  pravastatin  40 mg Oral Daily   revefenacin  175 mcg Nebulization Daily   sodium chloride flush  10 mL Intrapleural Q8H   Continuous Infusions:  cefTRIAXone (ROCEPHIN)  IV 2 g (10/11/22 1650)          Glade Lloyd, MD [facility] 10/12/2022, 7:29 AM

## 2022-10-12 NOTE — Progress Notes (Signed)
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  Last seen by Oncology 7/31. During that visit noted myeloma stable  and she is being managed on Revlimid and zometa EO month. Her disease is felt to be stable w/ the exception of prior hypercalcemia and recent AKI back in May 2024. Presents to ER 8/19 w/ cc right sided pleuritic chest pain, cough productive of thick brown sputum, wheezing and increased shortness of breath. Onset initially noted on 8/15 w/ fever > 101 full body aches and chills. No obvious sick exposures but was in contact w/ young child prior. In ER RVP neg, BNP 112, wbc 6.7. A CT of chest was obtained w/out contrast. This showed loculated right pleural effusion w/ associated RLL and RML airspace disease. Cultures were sent. She was started on empiric antibiotics and referred to interventional radiology where she underwent right sided thoracentesis. The procedure was stopped after only being able top drain 115 ml of yellow pleural fluid. Review of US showed multiple septations. Initial pleural LDH 401. 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  Hypercalcemia CKD and recurrent AKI 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  HTN  HL Current vaping   Significant Hospital Events: Including procedures, antibiotic start and stop dates in addition to other pertinent events   8/19 admitted w/ PNA and complex right pleural effusion. ABX started. Right sided thoracentesis  exudate removed. Volume limited due to septations and loculation of pleural fluid. PCCM asked to eval  8/20 Right pigtail chest tube inserted  8/21 Chest tube output 300 cc/24 hours TCTS consulted and collaborative decision made to pleural lytics over surgical intervention  8/22 second dose of pleural lytics, experienced significant pleuritic chest pain post lytics but 3L removed with lytics  8/23 third dose of lytics given  8/24 insert HD catheter and start iHD  8/26 Appears there was only out of chest tube in the last 24hrs   Interim History / Subjective:  Seen walking in the hallway   Objective   Blood pressure (!) 164/85, pulse (!) 103, temperature 98.2 F (36.8 C), temperature source Oral, resp. rate 20, height 5' 3.25" (1.607 m), weight 121.2 kg, SpO2 98%.        Intake/Output Summary (Last 24 hours) at 10/12/2022 2355 Last data filed at 10/12/2022 0335 Gross per 24 hour  Intake 980.83 ml  Output 540 ml  Net 440.83 ml   Filed Weights   10/10/22 0500 10/11/22 0500 10/12/22 0334  Weight: 123.2 kg 120.7 kg 121.2 kg    Examination: General: Well appearing slightly deconditioned adult female seen walking in the hallway  HEENT: [place]/AT, MM pink/moist, PERRL,  Neuro: Alert and oriented x3, non-focal  CV: s1s2 regular rate and rhythm, no murmur, rubs, or gallops,  PULM:  Clear to auscultation, no increased work of breathing, on RA GI: soft, bowel sounds active in all 4 quadrants, non-tender, non-distended, tolerating oral diet Extremities: warm/dry, no edema  Skin:  no rashes or lesions  Resolved Hospital Problem list    Assessment & Plan:   Community acquired pneumonia with complex right pleural effusion. Wheezing with hx of tobacco abuse and vaping. P: CXR today  Continue IV antibiotics can likely transition to PO soon  Can likley d/c chest tube today as output appears minimal  Routine chest tube care  Mobilize Pain control  Continue BDs Would benefit from  outpatient PFTs   AKI. CKD 3. Hyponatremia. Non-gap metabolic acidosis. iHD started 8/23 P:   Hx of multiple myeloma. - followed by Dr. Candise Che as an outpt P: Supportive care    Hx of DVT. P: Eliquis remains on hold    Best Practice (right click and "Reselect all SmartList Selections" daily)   Per primary    Critical care time: NA  Danyale Ridinger D. Harris, NP-C [facility] Personal contact information can be found on Amion  If no contact or response made please call [phone] 10/12/2022, 9:36 AM

## 2022-10-13 ENCOUNTER — Inpatient Hospital Stay (HOSPITAL_COMMUNITY): Payer: 59

## 2022-10-13 DIAGNOSIS — J90 Pleural effusion, not elsewhere classified: Secondary | ICD-10-CM | POA: Diagnosis not present

## 2022-10-13 LAB — CBC WITH DIFFERENTIAL/PLATELET
Abs Immature Granulocytes: 0.02 10*3/uL (ref 0.00–0.07)
Basophils Absolute: 0 10*3/uL (ref 0.0–0.1)
Basophils Relative: 0 %
Eosinophils Absolute: 0.1 10*3/uL (ref 0.0–0.5)
Eosinophils Relative: 3 %
HCT: 19.5 % — ABNORMAL LOW (ref 36.0–46.0)
Hemoglobin: 6.8 g/dL — CL (ref 12.0–15.0)
Immature Granulocytes: 1 %
Lymphocytes Relative: 14 %
Lymphs Abs: 0.5 10*3/uL — ABNORMAL LOW (ref 0.7–4.0)
MCH: 32.4 pg (ref 26.0–34.0)
MCHC: 34.9 g/dL (ref 30.0–36.0)
MCV: 92.9 fL (ref 80.0–100.0)
Monocytes Absolute: 0.5 10*3/uL (ref 0.1–1.0)
Monocytes Relative: 13 %
Neutro Abs: 2.5 10*3/uL (ref 1.7–7.7)
Neutrophils Relative %: 69 %
Platelets: 92 10*3/uL — ABNORMAL LOW (ref 150–400)
RBC: 2.1 MIL/uL — ABNORMAL LOW (ref 3.87–5.11)
RDW: 19.8 % — ABNORMAL HIGH (ref 11.5–15.5)
WBC: 3.6 10*3/uL — ABNORMAL LOW (ref 4.0–10.5)
nRBC: 0 % (ref 0.0–0.2)

## 2022-10-13 LAB — PHOSPHORUS: Phosphorus: 7.5 mg/dL — ABNORMAL HIGH (ref 2.5–4.6)

## 2022-10-13 LAB — IRON AND TIBC
Iron: 20 ug/dL — ABNORMAL LOW (ref 28–170)
Saturation Ratios: 10 % — ABNORMAL LOW (ref 10.4–31.8)
TIBC: 199 ug/dL — ABNORMAL LOW (ref 250–450)
UIBC: 179 ug/dL

## 2022-10-13 LAB — BASIC METABOLIC PANEL
Anion gap: 17 — ABNORMAL HIGH (ref 5–15)
BUN: 67 mg/dL — ABNORMAL HIGH (ref 6–20)
CO2: 20 mmol/L — ABNORMAL LOW (ref 22–32)
Calcium: 7.2 mg/dL — ABNORMAL LOW (ref 8.9–10.3)
Chloride: 94 mmol/L — ABNORMAL LOW (ref 98–111)
Creatinine, Ser: 6.55 mg/dL — ABNORMAL HIGH (ref 0.44–1.00)
GFR, Estimated: 7 mL/min — ABNORMAL LOW (ref >60)
Glucose, Bld: 94 mg/dL (ref 70–99)
Potassium: 3.3 mmol/L — ABNORMAL LOW (ref 3.5–5.1)
Sodium: 131 mmol/L — ABNORMAL LOW (ref 135–145)

## 2022-10-13 LAB — PREPARE RBC (CROSSMATCH)

## 2022-10-13 LAB — FERRITIN: Ferritin: 708 ng/mL — ABNORMAL HIGH (ref 11–307)

## 2022-10-13 LAB — HEMOGLOBIN AND HEMATOCRIT, BLOOD
HCT: 23.3 % — ABNORMAL LOW (ref 36.0–46.0)
Hemoglobin: 8 g/dL — ABNORMAL LOW (ref 12.0–15.0)

## 2022-10-13 LAB — HEPATITIS B SURFACE ANTIBODY, QUANTITATIVE: Hep B S AB Quant (Post): <3.5 m[IU]/mL — ABNORMAL LOW

## 2022-10-13 MED ORDER — CALCIUM ACETATE (PHOS BINDER) 667 MG PO CAPS
667.0000 mg | ORAL_CAPSULE | Freq: Three times a day (TID) | ORAL | Status: DC
  Administered 2022-10-13 – 2022-10-15 (×6): 667 mg via ORAL
  Filled 2022-10-13 (×6): qty 1

## 2022-10-13 MED ORDER — METOPROLOL TARTRATE 25 MG PO TABS
25.0000 mg | ORAL_TABLET | Freq: Two times a day (BID) | ORAL | Status: DC
  Administered 2022-10-13 – 2022-10-19 (×13): 25 mg via ORAL
  Filled 2022-10-13 (×13): qty 1

## 2022-10-13 MED ORDER — SODIUM CHLORIDE 0.9 % IV SOLN
250.0000 mg | Freq: Every day | INTRAVENOUS | Status: AC
  Administered 2022-10-13 – 2022-10-15 (×3): 250 mg via INTRAVENOUS
  Filled 2022-10-13 (×3): qty 20

## 2022-10-13 MED ORDER — POLYETHYLENE GLYCOL 3350 17 G PO PACK
17.0000 g | PACK | Freq: Every day | ORAL | Status: DC
  Administered 2022-10-13 – 2022-10-14 (×2): 17 g via ORAL
  Filled 2022-10-13 (×6): qty 1

## 2022-10-13 MED ORDER — POTASSIUM CHLORIDE CRYS ER 20 MEQ PO TBCR
40.0000 meq | EXTENDED_RELEASE_TABLET | Freq: Once | ORAL | Status: AC
  Administered 2022-10-13: 40 meq via ORAL
  Filled 2022-10-13: qty 2

## 2022-10-13 MED ORDER — SODIUM CHLORIDE 0.9% IV SOLUTION: Freq: Once | INTRAVENOUS | Status: AC

## 2022-10-13 NOTE — Progress Notes (Signed)
CXR stable after chest tube removal.  Breathing comfortable.  No chest pain.  Was able to walk in hall w/o difficulty.  PCCM will sign off.  Please call if additional help needed while she is in hospital.  Coralyn Helling, MD [facility] Pulmonary/Critical Care Pager - [phone] or [phone] 10/13/2022, 1:31 PM

## 2022-10-13 NOTE — Progress Notes (Signed)
Hgb is 6.8 this am without overt bleeding. Plan to transfuse 1 unit RBCs.

## 2022-10-13 NOTE — Progress Notes (Addendum)
Date and time results received: 10/13/22 0610  Test: Hgb Critical Value: 6.8  Name of Provider Notified: Odie Sera, MD  Orders Received? Yes

## 2022-10-13 NOTE — Progress Notes (Signed)
Case discussed with nephrologist this morning. Pt has had an increase in urine output therefore will hold off on clipping pt today and f/u with nephrologist tomorrow. Will assist as needed.   Olivia Canter Renal Navigator [phone]

## 2022-10-13 NOTE — Progress Notes (Signed)
PROGRESS NOTE    Stacey Montoya  ZOX:[id number] DOB: 05-17-1975 DOA: 10/05/2022 PCP: Benita Stabile, MD   Brief Narrative:  47 year old with history of hypertension, hyperlipidemia, multiple myeloma status post chemotherapy now in remission, DVT on Eliquis presented to the emergency room with 3 days of worsening shortness of breath and she was found to have right-sided upper lobe pulmonary consolidation, loculated pleural effusion along with AKI on CKD stage IIIa.  She was started on antibiotics.  She underwent right chest tube placement by pulmonary on 10/06/2022.  Nephrology and cardiothoracic surgery were also consulted.  She was transferred to [facility] for possibility of need of VATS by cardiothoracic surgery.  Subsequently, renal function worsened and she had insertion of nontunneled CVC catheter placed by PCCM on 10/10/2022.  Hemodialysis started on 10/10/2022.  Assessment & Plan:   Acute respiratory failure with hypoxia Community-acquired right-sided pneumonia with parapneumonic effusion -Pulmonary and CT surgery following.  CT surgery recommending conservative management at this time. -Patient received pleural fibrinolytics as per pulmonary.  Chest tube removed by pulmonary on 10/12/2022. -Currently on Rocephin.  Completed doxycycline. -Respiratory status improving.  Currently on room air  Acute kidney injury on on CKD stage IIIa Acute metabolic acidosis -Baseline creatinine of 1.13-1.22 -Unclear cause.  Nephrology following.  Off IV fluids.  Creatinine worsened to 7.75 on 10/10/2022. -Monitor. -Strict input and output. -she had insertion of nontunneled CVC catheter placed by PCCM on 10/10/2022.  Hemodialysis started on 10/10/2022.  Anemia of chronic disease -From renal failure and chronic illnesses. Transfused 1 unit of packed red cells on 10/11/2022.  Hemoglobin 6.8 this morning.  Transfuse 1 unit of packed red cells.  Monitor H&H.  Thrombocytopenia -Questionable cause.   Monitor.  No signs of bleeding.  Leukopenia -Mild.  Monitor intermittently.  Hypokalemia -Mild.  Monitor.  Hyponatremia -Mild.  Improving.  Monitor.  Multiple myeloma -Revlimid to be on hold till reevaluation by Dr. Candise Che as an outpatient as per his recommendations.  Abnormal LFTs -Hepatitis panel negative.  Right upper quadrant ultrasound showed hepatic steatosis.  LFTs trending down.  Monitor  History of DVT -Eliquis on hold for now.  Resume if cleared by consultants  Morbid obesity -Outpatient follow-up  DVT prophylaxis: SCDs Code Status: Full Family Communication: Mother at bedside Disposition Plan: Status is: Inpatient Remains inpatient appropriate because: Of severity of illness    Consultants: Pulmonary/nephrology/cardiothoracic surgery  Procedures: As above  Antimicrobials:  Anti-infectives (From admission, onward)    Start     Dose/Rate Route Frequency Ordered Stop   10/05/22 2200  acyclovir (ZOVIRAX) tablet 400 mg        400 mg Oral 2 times daily 10/05/22 1640     10/05/22 2000  cefTRIAXone (ROCEPHIN) 2 g in sodium chloride 0.9 % 100 mL IVPB        2 g 200 mL/hr over 30 Minutes Intravenous Daily-1800 10/05/22 1909     10/05/22 1800  doxycycline (VIBRAMYCIN) 100 mg in sodium chloride 0.9 % 250 mL IVPB        100 mg 125 mL/hr over 120 Minutes Intravenous Every 12 hours 10/05/22 1647 10/10/22 1759   10/05/22 1645  levofloxacin (LEVAQUIN) IVPB 500 mg  Status:  Discontinued        500 mg 100 mL/hr over 60 Minutes Intravenous Every 24 hours 10/05/22 1641 10/05/22 1641   10/05/22 1645  Levofloxacin (LEVAQUIN) IVPB 250 mg  Status:  Discontinued        250 mg 50 mL/hr over  60 Minutes Intravenous Every 24 hours 10/05/22 1641 10/05/22 1647        Subjective: Patient seen and examined at bedside.  No chest pain, fever, worsening shortness of breath reported.  Feels much better and is making some urine.  Objective: Vitals:   10/13/22 0348 10/13/22 0710  10/13/22 0725 10/13/22 0726  BP: (!) 158/81 (!) 169/77    Pulse:  97    Resp:  19    Temp: 98.2 F (36.8 C) 98.1 F (36.7 C)    TempSrc: Oral Oral    SpO2:  100% 97% 97%  Weight: 119.2 kg     Height:        Intake/Output Summary (Last 24 hours) at 10/13/2022 0747 Last data filed at 10/13/2022 0120 Gross per 24 hour  Intake 720 ml  Output 3700 ml  Net -2980 ml   Filed Weights   10/11/22 0500 10/12/22 0334 10/13/22 0348  Weight: 120.7 kg 121.2 kg 119.2 kg    Examination:  General: No acute distress.  Currently on room air. ENT/neck: No palpable thyromegaly or JVD elevation noted respiratory: Bilateral decreased breath sounds at bases with scattered crackles.  Right-sided chest tube has been removed CVS: Currently rate controlled; S1 and S2 are heard abdominal: Soft, morbidly obese, nontender, distended mildly; no organomegaly, bowel sounds are normally heard extremities: No clubbing; bilateral lower extremity edema present CNS: Alert and awake.  No obvious focal deficits noted  lymph: No palpable lymphadenopathy noted Skin: No obvious petechiae/rashes  psych: Normal mood, affect and judgment. Musculoskeletal: No obvious joint swelling/deformity  Data Reviewed: I have personally reviewed following labs and imaging studies  CBC: Recent Labs  Lab 10/10/22 0608 10/11/22 0355 10/11/22 1056 10/12/22 0623 10/13/22 0548  WBC 2.3* 3.2* 4.1 4.5 3.6*  NEUTROABS  --   --   --  3.6 2.5  HGB 8.2* 6.4* 7.2* 7.2* 6.8*  HCT 24.0* 18.5* 21.3* 20.8* 19.5*  MCV 96.4 95.9 92.6 91.6 92.9  PLT 109* 94* 92* 86* 92*   Basic Metabolic Panel: Recent Labs  Lab 10/07/22 0422 10/08/22 0414 10/09/22 1552 10/10/22 0434 10/11/22 0355 10/12/22 0623 10/13/22 0548  NA 133*   < > 119* 121* 127* 129* 131*  K 4.1   < > 3.8 4.0 3.3* 3.4* 3.3*  CL 105   < > 89* 89* 93* 93* 94*  CO2 15*   < > 14* 14* 18* 16* 20*  GLUCOSE 98   < > 93 96 90 92 94  BUN 44*   < > 77* 79* 56* 78* 67*  CREATININE  4.68*   < > 6.97* 7.75* 6.21* 7.79* 6.55*  CALCIUM 8.2*   < > 6.1* 6.0* 6.4* 6.8* 7.2*  MG  --   --   --  1.7  --   --   --   PHOS 4.7*  --  8.5*  --   --   --  7.5*   < > = values in this interval not displayed.   GFR: Estimated Creatinine Clearance: 13.6 mL/min (A) (by C-G formula based on SCr of 6.55 mg/dL (H)). Liver Function Tests: Recent Labs  Lab 10/07/22 0422 10/08/22 0414 10/09/22 0429 10/09/22 1552 10/10/22 0434  AST  --  54* 45*  --  43*  ALT  --  132* 78*  --  61*  ALKPHOS  --  190* 111  --  117  BILITOT  --  1.4* 1.2  --  1.0  PROT  --  6.2*  5.0*  --  4.9*  ALBUMIN 2.6* 2.6* 2.0* 1.8* 1.8*   No results for input(s): "LIPASE", "AMYLASE" in the last 168 hours. No results for input(s): "AMMONIA" in the last 168 hours. Coagulation Profile: No results for input(s): "INR", "PROTIME" in the last 168 hours. Cardiac Enzymes: Recent Labs  Lab 10/09/22 0429  CKTOTAL 386*   BNP (last 3 results) No results for input(s): "PROBNP" in the last 8760 hours. HbA1C: No results for input(s): "HGBA1C" in the last 72 hours. CBG: Recent Labs  Lab 10/11/22 2007 10/12/22 0557  GLUCAP 104* 85   Lipid Profile: No results for input(s): "CHOL", "HDL", "LDLCALC", "TRIG", "CHOLHDL", "LDLDIRECT" in the last 72 hours. Thyroid Function Tests: No results for input(s): "TSH", "T4TOTAL", "FREET4", "T3FREE", "THYROIDAB" in the last 72 hours. Anemia Panel: Recent Labs    10/13/22 0548  FERRITIN 708*  TIBC 199*  IRON 20*   Sepsis Labs: No results for input(s): "PROCALCITON", "LATICACIDVEN" in the last 168 hours.  Recent Results (from the past 240 hour(s))  Resp panel by RT-PCR (RSV, Flu A&B, Covid) Anterior Nasal Swab     Status: None   Collection Time: 10/05/22 11:18 AM   Specimen: Anterior Nasal Swab  Result Value Ref Range Status   SARS Coronavirus 2 by RT PCR NEGATIVE NEGATIVE Final    Comment: (NOTE) SARS-CoV-2 target nucleic acids are NOT DETECTED.  The SARS-CoV-2 RNA is  generally detectable in upper respiratory specimens during the acute phase of infection. The lowest concentration of SARS-CoV-2 viral copies this assay can detect is 138 copies/mL. A negative result does not preclude SARS-Cov-2 infection and should not be used as the sole basis for treatment or other patient management decisions. A negative result may occur with  improper specimen collection/handling, submission of specimen other than nasopharyngeal swab, presence of viral mutation(s) within the areas targeted by this assay, and inadequate number of viral copies(<138 copies/mL). A negative result must be combined with clinical observations, patient history, and epidemiological information. The expected result is Negative.  Fact Sheet for Patients:  BloggerCourse.com  Fact Sheet for Healthcare Providers:  SeriousBroker.it  This test is no t yet approved or cleared by the [place] FDA and  has been authorized for detection and/or diagnosis of SARS-CoV-2 by FDA under an Emergency Use Authorization (EUA). This EUA will remain  in effect (meaning this test can be used) for the duration of the COVID-19 declaration under Section 564(b)(1) of the Act, 21 U.S.C.section 360bbb-3(b)(1), unless the authorization is terminated  or revoked sooner.       Influenza A by PCR NEGATIVE NEGATIVE Final   Influenza B by PCR NEGATIVE NEGATIVE Final    Comment: (NOTE) The Xpert Xpress SARS-CoV-2/FLU/RSV plus assay is intended as an aid in the diagnosis of influenza from Nasopharyngeal swab specimens and should not be used as a sole basis for treatment. Nasal washings and aspirates are unacceptable for Xpert Xpress SARS-CoV-2/FLU/RSV testing.  Fact Sheet for Patients: BloggerCourse.com  Fact Sheet for Healthcare Providers: SeriousBroker.it  This test is not yet approved or cleared by the [place] FDA and has been authorized for detection and/or diagnosis of SARS-CoV-2 by FDA under an Emergency Use Authorization (EUA). This EUA will remain in effect (meaning this test can be used) for the duration of the COVID-19 declaration under Section 564(b)(1) of the Act, 21 U.S.C. section 360bbb-3(b)(1), unless the authorization is terminated or revoked.     Resp Syncytial Virus by PCR NEGATIVE NEGATIVE Final    Comment: (  NOTE) Fact Sheet for Patients: BloggerCourse.com  Fact Sheet for Healthcare Providers: SeriousBroker.it  This test is not yet approved or cleared by the [place] FDA and has been authorized for detection and/or diagnosis of SARS-CoV-2 by FDA under an Emergency Use Authorization (EUA). This EUA will remain in effect (meaning this test can be used) for the duration of the COVID-19 declaration under Section 564(b)(1) of the Act, 21 U.S.C. section 360bbb-3(b)(1), unless the authorization is terminated or revoked.  Performed at [facility], [address], [place], [place] [address]   Body fluid culture w Gram Stain     Status: None   Collection Time: 10/05/22  4:55 PM   Specimen: Pleura; Body Fluid  Result Value Ref Range Status   Specimen Description   Final    PLEURAL Performed at [facility], [address] [place], [place] [address]    Special Requests   Final    NONE Performed at [facility], [address], [place], [place] [address]    Gram Stain   Final    CYTOSPIN SMEAR WBC PRESENT, PREDOMINANTLY PMN NO ORGANISMS SEEN    Culture   Final    NO GROWTH 3 DAYS Performed at [facility], [address] [place], [place] [address]    Report Status 10/10/2022 FINAL  Final  Culture, blood (Routine X 2) w Reflex to ID Panel     Status: None   Collection Time: 10/06/22  8:26 AM   Specimen: BLOOD  Result Value Ref Range Status    Specimen Description   Final    BLOOD BLOOD LEFT HAND Performed at [facility], [address], [place], [place] [address]    Special Requests   Final    BOTTLES DRAWN AEROBIC ONLY Blood Culture adequate volume Performed at [facility], [address] [place], [place] [address]    Culture   Final    NO GROWTH 5 DAYS Performed at [facility], [address] [place], [place] [address]    Report Status 10/11/2022 FINAL  Final  Culture, blood (Routine X 2) w Reflex to ID Panel     Status: None   Collection Time: 10/06/22  8:26 AM   Specimen: BLOOD  Result Value Ref Range Status   Specimen Description   Final    BLOOD BLOOD LEFT ARM Performed at [facility], [address], [place], [place] [address]    Special Requests   Final    BOTTLES DRAWN AEROBIC AND ANAEROBIC Blood Culture adequate volume Performed at [facility], [address] [place], [place] [address]    Culture   Final    NO GROWTH 5 DAYS Performed at [facility], [address] [place], [place] [address]    Report Status 10/11/2022 FINAL  Final  Expectorated Sputum Assessment w Gram Stain, Rflx to Resp Cult     Status: None   Collection Time: 10/06/22  8:50 AM   Specimen: Sputum  Result Value Ref Range Status   Specimen Description SPUTUM  Final   Special Requests NONE  Final   Sputum evaluation   Final    THIS SPECIMEN IS ACCEPTABLE FOR SPUTUM CULTURE Performed at [facility], [address], [place], [place] [address]    Report Status 10/06/2022 FINAL  Final  Culture, Respiratory w Gram Stain     Status: None   Collection Time:  10/06/22  8:50 AM   Specimen: SPU  Result Value Ref Range Status   Specimen Description   Final    SPUTUM Performed at [facility], [address] [place], [place] [address]    Special Requests   Final    NONE Reflexed from [id number] Performed at  [facility], [address] [place], [place] [address]    Gram Stain   Final    RARE WBC PRESENT,BOTH PMN AND MONONUCLEAR MODERATE GRAM POSITIVE COCCI IN PAIRS FEW GRAM NEGATIVE RODS    Culture   Final    FEW Normal respiratory flora-no Staph aureus or Pseudomonas seen Performed at [facility], [address], [place], [place] [address]    Report Status 10/08/2022 FINAL  Final         Radiology Studies: DG CHEST PORT 1 VIEW  Result Date: 10/12/2022 CLINICAL DATA:  Pleural effusion with pleural drain in situ EXAM: PORTABLE CHEST 1 VIEW COMPARISON:  10/10/2022 FINDINGS: Right base collapse/consolidation with effusion is similar to prior. Right pleural drain again noted. No evidence for right-sided pneumothorax. Right IJ central line tip overlies the mid SVC. Left lung clear. The cardiopericardial silhouette is within normal limits for size. Telemetry leads overlie the chest. IMPRESSION: Stable exam. Right base collapse/consolidation with effusion. No pneumothorax. Electronically Signed   By: Kennith Center M.D.   On: 10/12/2022 14:37        Scheduled Meds:  sodium chloride   Intravenous Once   acyclovir  400 mg Oral BID   arformoterol  15 mcg Nebulization BID   budesonide (PULMICORT) nebulizer solution  0.5 mg Nebulization BID   Chlorhexidine Gluconate Cloth  6 each Topical Daily   docusate sodium  100 mg Oral BID   gabapentin  100 mg Oral TID   medroxyPROGESTERone  10 mg Oral Daily   metoprolol tartrate  12.5 mg Oral BID   nicotine  21 mg Transdermal Q24H   pantoprazole  40 mg Oral Daily   pravastatin  40 mg Oral Daily   revefenacin  175 mcg Nebulization Daily   sodium chloride flush  10 mL Intrapleural Q8H   Continuous Infusions:  cefTRIAXone (ROCEPHIN)  IV 2 g (10/12/22 1839)          Glade Lloyd, MD [facility] 10/13/2022, 7:47 AM

## 2022-10-13 NOTE — Progress Notes (Signed)
[facility] NEPHROLOGY PROGRESS NOTE  Assessment/ Plan: Pt is a 47 y.o. yo female  with AKI after presenting with loculated parapneumonic effusion, hx/o autologous SCT for MM   # Acute kidney injury on CKD 3, oliguric and now dialysis dependent.  This is likely ATN due to Toradol, + fluid shift from draining pleural effusion/hemodynamic changes.  UA, US renal unremarkable.  Hep B, hep C, HIV, kappa lambda ratio unremarkable  RIJ temp HD catheter placed on 8/24 by CCM and received first HD. Second HD on 8/26 with 1 L UF.  Patient also had urine output of 2.7 L which is encouraging.  I am going to hold dialysis and watch for renal recovery.  Strict ins and out and daily lab.  I have informed renal navigator to look for outpatient HD for AKI in the case of no renal recovery.  Discussed with her today as well with the updates.  # Loculated parapneumonic effusion status post chest tube and pleurodesis per pulmonary team.  # Hx/o MM and autologous SCT at [facility] 2022; neg rpt studies here  # Hyponatremia, hypervolemic: UF with HD.  Continue fluid restriction.  # Hypokalemia: Dialysis with high potassium bath.  Replete potassium chloride.  # Hypocalcemia: Corrected calcium level is 8.5  # Anemia: Iron saturation 10%.  I will order IV iron.  Receiving a unit of blood transfusion.  Monitor CBC.    # Hypertension/volume: On metoprolol, UF with HD.  Also having auto diuresis.  # Hyperphosphatemia: Start calcium acetate with meals.  I have discussed with the primary team, the patient and her mother who was presented at the bedside.  Subjective: Seen and examined at the bedside.  Event noted with low hemoglobin and blood transfusion.  Urine output around 2.7 L.  She reports feeling much better today.  Denies nausea, vomiting, chest pain, shortness of breath.  Her mother was present at the bedside.  Objective Vital signs in last 24 hours: Vitals:   10/13/22 0348 10/13/22 0710 10/13/22  0725 10/13/22 0726  BP: (!) 158/81 (!) 169/77    Pulse:  97    Resp:  19    Temp: 98.2 F (36.8 C) 98.1 F (36.7 C)    TempSrc: Oral Oral    SpO2:  100% 97% 97%  Weight: 119.2 kg     Height:       Weight change: -2 kg  Intake/Output Summary (Last 24 hours) at 10/13/2022 0824 Last data filed at 10/13/2022 0120 Gross per 24 hour  Intake 720 ml  Output 3700 ml  Net -2980 ml       Labs: RENAL PANEL Recent Labs  Lab 10/07/22 0422 10/08/22 0414 10/09/22 0429 10/09/22 1552 10/10/22 0434 10/11/22 0355 10/12/22 0623 10/13/22 0548  NA 133* 131* 123* 119* 121* 127* 129* 131*  K 4.1 3.4* 4.0 3.8 4.0 3.3* 3.4* 3.3*  CL 105 97* 89* 89* 89* 93* 93* 94*  CO2 15* 19* 12* 14* 14* 18* 16* 20*  GLUCOSE 98 142* 105* 93 96 90 92 94  BUN 44* 48* 67* 77* 79* 56* 78* 67*  CREATININE 4.68* 4.78* 6.37* 6.97* 7.75* 6.21* 7.79* 6.55*  CALCIUM 8.2* 7.8* 6.2* 6.1* 6.0* 6.4* 6.8* 7.2*  MG  --   --   --   --  1.7  --   --   --   PHOS 4.7*  --   --  8.5*  --   --   --  7.5*  ALBUMIN 2.6* 2.6*  2.0* 1.8* 1.8*  --   --   --     Liver Function Tests: Recent Labs  Lab 10/08/22 0414 10/09/22 0429 10/09/22 1552 10/10/22 0434  AST 54* 45*  --  43*  ALT 132* 78*  --  61*  ALKPHOS 190* 111  --  117  BILITOT 1.4* 1.2  --  1.0  PROT 6.2* 5.0*  --  4.9*  ALBUMIN 2.6* 2.0* 1.8* 1.8*   No results for input(s): "LIPASE", "AMYLASE" in the last 168 hours. No results for input(s): "AMMONIA" in the last 168 hours. CBC: Recent Labs    10/10/22 0608 10/11/22 0355 10/11/22 1056 10/12/22 0623 10/13/22 0548  HGB 8.2* 6.4* 7.2* 7.2* 6.8*  MCV 96.4 95.9 92.6 91.6 92.9  FERRITIN  --   --   --   --  708*  TIBC  --   --   --   --  199*  IRON  --   --   --   --  20*    Cardiac Enzymes: Recent Labs  Lab 10/09/22 0429  CKTOTAL 386*   CBG: Recent Labs  Lab 10/11/22 2007 10/12/22 0557  GLUCAP 104* 85    Iron Studies:  Recent Labs    10/13/22 0548  IRON 20*  TIBC 199*  FERRITIN 708*    Studies/Results: DG CHEST PORT 1 VIEW  Result Date: 10/12/2022 CLINICAL DATA:  Pleural effusion with pleural drain in situ EXAM: PORTABLE CHEST 1 VIEW COMPARISON:  10/10/2022 FINDINGS: Right base collapse/consolidation with effusion is similar to prior. Right pleural drain again noted. No evidence for right-sided pneumothorax. Right IJ central line tip overlies the mid SVC. Left lung clear. The cardiopericardial silhouette is within normal limits for size. Telemetry leads overlie the chest. IMPRESSION: Stable exam. Right base collapse/consolidation with effusion. No pneumothorax. Electronically Signed   By: Kennith Center M.D.   On: 10/12/2022 14:37    Medications: Infusions:  cefTRIAXone (ROCEPHIN)  IV 2 g (10/12/22 1839)   ferric gluconate (FERRLECIT) IVPB      Scheduled Medications:  sodium chloride   Intravenous Once   acyclovir  400 mg Oral BID   arformoterol  15 mcg Nebulization BID   budesonide (PULMICORT) nebulizer solution  0.5 mg Nebulization BID   Chlorhexidine Gluconate Cloth  6 each Topical Daily   docusate sodium  100 mg Oral BID   gabapentin  100 mg Oral TID   medroxyPROGESTERone  10 mg Oral Daily   metoprolol tartrate  25 mg Oral BID   nicotine  21 mg Transdermal Q24H   pantoprazole  40 mg Oral Daily   polyethylene glycol  17 g Oral Daily   pravastatin  40 mg Oral Daily   revefenacin  175 mcg Nebulization Daily   sodium chloride flush  10 mL Intrapleural Q8H    have reviewed scheduled and prn medications.  Physical Exam: General:NAD, comfortable Heart:RRR, s1s2 nl Lungs: Clear bilateral. Abdomen:soft, Non-tender, non-distended Extremities:No edema Dialysis Access: Right IJ temporary HD catheter in place, site clean.  Stacey Montoya 10/13/2022,8:24 AM  LOS: 8 days

## 2022-10-13 NOTE — Progress Notes (Signed)
Mobility Specialist Progress Note:   10/13/22 1127  Mobility  Activity Ambulated with assistance in hallway  Level of Assistance Contact guard assist, steadying assist  Assistive Device Four wheel walker  Distance Ambulated (ft) 470 ft  Activity Response Tolerated well  Mobility Referral Yes  $Mobility charge 1 Mobility  Mobility Specialist Start Time (ACUTE ONLY) 1104  Mobility Specialist Stop Time (ACUTE ONLY) 1118  Mobility Specialist Time Calculation (min) (ACUTE ONLY) 14 min    Pre Mobility: 101 HR,  165/75 (97) BP,  96% SpO2 During Mobility: 128 HR Post Mobility:  119 HR  Pt received in bed, agreeable to mobility. Asymptomatic throughout w/ increased distance. Pt left in bed with call bell and family present.  D'Vante Earlene Plater Mobility Specialist Please contact via Special educational needs teacher or Rehab office at [phone]

## 2022-10-14 DIAGNOSIS — J9601 Acute respiratory failure with hypoxia: Secondary | ICD-10-CM | POA: Diagnosis present

## 2022-10-14 DIAGNOSIS — J90 Pleural effusion, not elsewhere classified: Secondary | ICD-10-CM | POA: Diagnosis not present

## 2022-10-14 DIAGNOSIS — C9000 Multiple myeloma not having achieved remission: Secondary | ICD-10-CM

## 2022-10-14 DIAGNOSIS — N179 Acute kidney failure, unspecified: Secondary | ICD-10-CM | POA: Diagnosis not present

## 2022-10-14 DIAGNOSIS — D696 Thrombocytopenia, unspecified: Secondary | ICD-10-CM | POA: Diagnosis present

## 2022-10-14 LAB — RENAL FUNCTION PANEL
Albumin: 1.9 g/dL — ABNORMAL LOW (ref 3.5–5.0)
Anion gap: 17 — ABNORMAL HIGH (ref 5–15)
BUN: 82 mg/dL — ABNORMAL HIGH (ref 6–20)
CO2: 18 mmol/L — ABNORMAL LOW (ref 22–32)
Calcium: 7.8 mg/dL — ABNORMAL LOW (ref 8.9–10.3)
Chloride: 98 mmol/L (ref 98–111)
Creatinine, Ser: 7.29 mg/dL — ABNORMAL HIGH (ref 0.44–1.00)
GFR, Estimated: 7 mL/min — ABNORMAL LOW (ref >60)
Glucose, Bld: 96 mg/dL (ref 70–99)
Phosphorus: 9.1 mg/dL — ABNORMAL HIGH (ref 2.5–4.6)
Potassium: 4.1 mmol/L (ref 3.5–5.1)
Sodium: 133 mmol/L — ABNORMAL LOW (ref 135–145)

## 2022-10-14 LAB — TYPE AND SCREEN
ABO/RH(D): A POS
Antibody Screen: NEGATIVE
Unit division: 0

## 2022-10-14 LAB — CBC WITH DIFFERENTIAL/PLATELET
Abs Immature Granulocytes: 0.01 10*3/uL (ref 0.00–0.07)
Basophils Absolute: 0 10*3/uL (ref 0.0–0.1)
Basophils Relative: 1 %
Eosinophils Absolute: 0.2 10*3/uL (ref 0.0–0.5)
Eosinophils Relative: 4 %
HCT: 25.3 % — ABNORMAL LOW (ref 36.0–46.0)
Hemoglobin: 8.7 g/dL — ABNORMAL LOW (ref 12.0–15.0)
Immature Granulocytes: 0 %
Lymphocytes Relative: 13 %
Lymphs Abs: 0.5 10*3/uL — ABNORMAL LOW (ref 0.7–4.0)
MCH: 31.9 pg (ref 26.0–34.0)
MCHC: 34.4 g/dL (ref 30.0–36.0)
MCV: 92.7 fL (ref 80.0–100.0)
Monocytes Absolute: 0.6 10*3/uL (ref 0.1–1.0)
Monocytes Relative: 16 %
Neutro Abs: 2.7 10*3/uL (ref 1.7–7.7)
Neutrophils Relative %: 66 %
Platelets: 104 10*3/uL — ABNORMAL LOW (ref 150–400)
RBC: 2.73 MIL/uL — ABNORMAL LOW (ref 3.87–5.11)
RDW: 19.9 % — ABNORMAL HIGH (ref 11.5–15.5)
WBC: 4 10*3/uL (ref 4.0–10.5)
nRBC: 0 % (ref 0.0–0.2)

## 2022-10-14 LAB — BPAM RBC
Blood Product Expiration Date: 202409172359
ISSUE DATE / TIME: 202408270920
Unit Type and Rh: 6200

## 2022-10-14 MED ORDER — HEPARIN SODIUM (PORCINE) 5000 UNIT/ML IJ SOLN
5000.0000 [IU] | Freq: Three times a day (TID) | INTRAMUSCULAR | Status: DC
  Administered 2022-10-14 – 2022-10-18 (×14): 5000 [IU] via SUBCUTANEOUS
  Filled 2022-10-14 (×15): qty 1

## 2022-10-14 NOTE — Progress Notes (Signed)
[facility] NEPHROLOGY PROGRESS NOTE  Assessment/ Plan: Pt is a 47 y.o. yo female  with AKI after presenting with loculated parapneumonic effusion, hx/o autologous SCT for MM   # Acute kidney injury on CKD 3, oliguric and now dialysis dependent.  This is likely ATN due to Toradol, + fluid shift from draining pleural effusion/hemodynamic changes.  UA, US renal unremarkable.  Hep B, hep C, HIV, kappa lambda ratio unremarkable  RIJ temp HD catheter placed on 8/24 by CCM and received first HD and second HD on 8/26.  She has increased urine output however creatinine level is trending up.  Clinically much better without any sign or symptoms of uremia.  I am going to hold dialysis and continue to watch for renal recovery.  Recommend to keep HD catheter for next few days.  I have discussed with the patient and her mother today.  # Loculated parapneumonic effusion status post chest tube and pleurodesis per pulmonary team.  # Hx/o MM and autologous SCT at [facility] 2022; neg rpt studies here  # Hyponatremia, hypervolemic: UF with HD.  Continue fluid restriction.  # Hypokalemia: Improved with dialysis and potassium chloride repletion.  # Hypocalcemia: Corrected calcium level is acceptable  # Anemia: Iron saturation 10%.  I have ordered IV iron.  Received blood transfusion as well. Monitor CBC.    # Hypertension/volume: On metoprolol, UF with HD.  Also having auto diuresis.  # Hyperphosphatemia: Started calcium acetate with meals.  I have discussed with her mother who was presented at the bedside.  Subjective: Seen and examined at the bedside.  Urine output is recorded around 2.9 L.  Reports feeling much better.  Denies nausea, vomiting, chest pain, shortness of breath. Objective Vital signs in last 24 hours: Vitals:   10/14/22 0355 10/14/22 0805 10/14/22 0806 10/14/22 0807  BP: (!) 153/83     Pulse: 97     Resp: 15     Temp: 98.8 F (37.1 C)     TempSrc: Oral     SpO2: 97% 100%  100% 99%  Weight: 120.1 kg     Height:       Weight change: 0.9 kg  Intake/Output Summary (Last 24 hours) at 10/14/2022 1023 Last data filed at 10/14/2022 0403 Gross per 24 hour  Intake 1637.33 ml  Output 2900 ml  Net -1262.67 ml       Labs: RENAL PANEL Recent Labs  Lab 10/08/22 0414 10/09/22 0429 10/09/22 1552 10/10/22 0434 10/11/22 0355 10/12/22 0623 10/13/22 0548 10/14/22 0418  NA 131* 123* 119* 121* 127* 129* 131* 133*  K 3.4* 4.0 3.8 4.0 3.3* 3.4* 3.3* 4.1  CL 97* 89* 89* 89* 93* 93* 94* 98  CO2 19* 12* 14* 14* 18* 16* 20* 18*  GLUCOSE 142* 105* 93 96 90 92 94 96  BUN 48* 67* 77* 79* 56* 78* 67* 82*  CREATININE 4.78* 6.37* 6.97* 7.75* 6.21* 7.79* 6.55* 7.29*  CALCIUM 7.8* 6.2* 6.1* 6.0* 6.4* 6.8* 7.2* 7.8*  MG  --   --   --  1.7  --   --   --   --   PHOS  --   --  8.5*  --   --   --  7.5* 9.1*  ALBUMIN 2.6* 2.0* 1.8* 1.8*  --   --   --  1.9*    Liver Function Tests: Recent Labs  Lab 10/08/22 0414 10/09/22 0429 10/09/22 1552 10/10/22 0434 10/14/22 0418  AST 54* 45*  --  43*  --   ALT 132* 78*  --  61*  --   ALKPHOS 190* 111  --  117  --   BILITOT 1.4* 1.2  --  1.0  --   PROT 6.2* 5.0*  --  4.9*  --   ALBUMIN 2.6* 2.0* 1.8* 1.8* 1.9*   No results for input(s): "LIPASE", "AMYLASE" in the last 168 hours. No results for input(s): "AMMONIA" in the last 168 hours. CBC: Recent Labs    10/11/22 1056 10/12/22 0623 10/13/22 0548 10/13/22 1518 10/14/22 0418  HGB 7.2* 7.2* 6.8* 8.0* 8.7*  MCV 92.6 91.6 92.9  --  92.7  FERRITIN  --   --  708*  --   --   TIBC  --   --  199*  --   --   IRON  --   --  20*  --   --     Cardiac Enzymes: Recent Labs  Lab 10/09/22 0429  CKTOTAL 386*   CBG: Recent Labs  Lab 10/11/22 2007 10/12/22 0557  GLUCAP 104* 85    Iron Studies:  Recent Labs    10/13/22 0548  IRON 20*  TIBC 199*  FERRITIN 708*   Studies/Results: DG Chest 2 View  Result Date: 10/13/2022 CLINICAL DATA:  142230 Pleural effusion 142230.  Shortness of breath. EXAM: CHEST - 2 VIEW COMPARISON:  Chest radiograph 10/12/2022. FINDINGS: 0611 hours. Unchanged right IJ approach central venous catheter with tip projecting over the low SVC. Interval removal of the right sided pleural drainage catheter. No residual pneumothorax. Unchanged moderate right pleural effusion with adjacent right basilar atelectasis. IMPRESSION: 1. Interval removal of the right sided pleural drainage catheter. No residual pneumothorax. 2. Unchanged moderate right pleural effusion with adjacent right basilar atelectasis. Electronically Signed   By: Orvan Falconer M.D.   On: 10/13/2022 10:16   DG CHEST PORT 1 VIEW  Result Date: 10/12/2022 CLINICAL DATA:  Pleural effusion with pleural drain in situ EXAM: PORTABLE CHEST 1 VIEW COMPARISON:  10/10/2022 FINDINGS: Right base collapse/consolidation with effusion is similar to prior. Right pleural drain again noted. No evidence for right-sided pneumothorax. Right IJ central line tip overlies the mid SVC. Left lung clear. The cardiopericardial silhouette is within normal limits for size. Telemetry leads overlie the chest. IMPRESSION: Stable exam. Right base collapse/consolidation with effusion. No pneumothorax. Electronically Signed   By: Kennith Center M.D.   On: 10/12/2022 14:37    Medications: Infusions:  cefTRIAXone (ROCEPHIN)  IV 2 g (10/13/22 1807)   ferric gluconate (FERRLECIT) IVPB 250 mg (10/13/22 1302)    Scheduled Medications:  acyclovir  400 mg Oral BID   arformoterol  15 mcg Nebulization BID   budesonide (PULMICORT) nebulizer solution  0.5 mg Nebulization BID   calcium acetate  667 mg Oral TID WC   Chlorhexidine Gluconate Cloth  6 each Topical Daily   docusate sodium  100 mg Oral BID   gabapentin  100 mg Oral TID   medroxyPROGESTERone  10 mg Oral Daily   metoprolol tartrate  25 mg Oral BID   nicotine  21 mg Transdermal Q24H   pantoprazole  40 mg Oral Daily   polyethylene glycol  17 g Oral Daily   pravastatin   40 mg Oral Daily   revefenacin  175 mcg Nebulization Daily   sodium chloride flush  10 mL Intrapleural Q8H    have reviewed scheduled and prn medications.  Physical Exam: General:NAD, comfortable Heart:RRR, s1s2 nl Lungs: Clear bilateral. Abdomen:soft, Non-tender, non-distended Extremities:No  edema Dialysis Access: Right IJ temporary HD catheter in place, site clean.  Trish Mancinelli Prasad Kaneesha Constantino 10/14/2022,10:23 AM  LOS: 9 days

## 2022-10-14 NOTE — Progress Notes (Signed)
Mobility Specialist Progress Note:   10/14/22 1100  Mobility  Activity Ambulated with assistance in hallway  Level of Assistance Standby assist, set-up cues, supervision of patient - no hands on  Assistive Device Four wheel walker  Distance Ambulated (ft) 470 ft  Activity Response Tolerated well  Mobility Referral Yes  $Mobility charge 1 Mobility  Mobility Specialist Start Time (ACUTE ONLY) 1100  Mobility Specialist Stop Time (ACUTE ONLY) 1118  Mobility Specialist Time Calculation (min) (ACUTE ONLY) 18 min    Pre Mobility: 95 HR During Mobility: 124 HR Post Mobility:  107 HR  Pt received in bed, agreeable to mobility. Asymptomatic throughout w/ no complaints. Pt left on EOB with call bell and family present.  D'Vante Earlene Plater Mobility Specialist Please contact via Special educational needs teacher or Rehab office at [phone]

## 2022-10-14 NOTE — Plan of Care (Signed)

## 2022-10-14 NOTE — Progress Notes (Signed)
[facility]                                                                              Stacey Montoya, is a 47 y.o. female, DOB - 06/02/1975, XBJ:[id number] Admit date - 10/05/2022    Outpatient Primary MD for the patient is [name], [name], MD  LOS - 9  days  Chief Complaint  Patient presents with   Shortness of Breath       Brief summary   Patient is a 47 year old female with HTN, HLP, multiple myeloma s/p chemotherapy, now in remission, DVT on Eliquis presented to ED with 3 days of worsening shortness of breath.  She was found to have right-sided upper lobe pulmonary consolidation, loculated pleural effusion, AKI.  She was placed on antibiotics, underwent right chest tube placement on 10/06/2022.  Nephrology, cardiothoracic surgery also consulted.  She had insertion of nontunneled CVC catheter placement by PCCM on 8/24, HD started on 10/10/2022.   Assessment & Plan    Principal Problem:   Acute respiratory failure with hypoxia (HCC) Community-acquired pneumonia, right-sided with loculated parapneumonic effusion -chest tube was placed and removed on 10/12/2022.  Patient received pleural fibrinolytics as per pulmonology -Completed doxycycline, currently on Rocephin -Continue bronchodilators, pain control, mobilize, outpatient PFTs -Improving, O2 sats 99% on room air  Active Problems:   AKI (acute kidney injury) (HCC) on CKD stage IIIa, non-anion gap metabolic acidosis -Nephrology consulted, likely ATN due to Toradol and fluid shift with draining pleural effusion, hemodynamic changes. -Renal ultrasound unremarkable. -Right IJ temp HD catheter placed on 8/24 by PCCM, started on HD on 8/24 -Received second HD on 8/26. - Per patient urine output improving,  CLIP process on hold -Creatinine trended up to 7.2 today, K 4.1    Normocytic anemia, anemia of chronic disease -From renal failure, multiple myeloma, chronic illness -Transfused 1 unit packed RBCs on 8/25,  8/27 -H&H stable, 8.7    Multiple myeloma (HCC) -Revlimid on hold until reevaluation by Dr. Candise Che as outpatient  Leukopenia, -Resolved  Thrombocytopenia (HCC) -Improving  Hyponatremia -Improving  Hypocalcemia -Albumin on 8/24 1.8 -Improving    Obesity, Class III, BMI 40-49.9 (morbid obesity) (HCC) Estimated body mass index is 46.53 kg/m as calculated from the following:   Height as of this encounter: 5' 3.25" (1.607 m).   Weight as of this encounter: 120.1 kg.  Code Status: Full code DVT Prophylaxis:  SCDs Start: 10/05/22 1639   Level of Care: Level of care: Progressive Family Communication: Updated patient's mother at bedside Disposition Plan:      Remains inpatient appropriate:      Procedures:  Chest tube placement and removal HD  Consultants:   Pulmonary critical care CT surgery Nephrology  Antimicrobials:   Anti-infectives (From admission, onward)    Start     Dose/Rate Route Frequency Ordered Stop   10/05/22 2200  acyclovir (ZOVIRAX) tablet 400 mg        400 mg Oral 2 times daily 10/05/22 1640     10/05/22 2000  cefTRIAXone (ROCEPHIN) 2 g in sodium chloride 0.9 % 100 mL IVPB        2 g  200 mL/hr over 30 Minutes Intravenous Daily-1800 10/05/22 1909     10/05/22 1800  doxycycline (VIBRAMYCIN) 100 mg in sodium chloride 0.9 % 250 mL IVPB        100 mg 125 mL/hr over 120 Minutes Intravenous Every 12 hours 10/05/22 1647 10/10/22 1759   10/05/22 1645  levofloxacin (LEVAQUIN) IVPB 500 mg  Status:  Discontinued        500 mg 100 mL/hr over 60 Minutes Intravenous Every 24 hours 10/05/22 1641 10/05/22 1641   10/05/22 1645  Levofloxacin (LEVAQUIN) IVPB 250 mg  Status:  Discontinued        250 mg 50 mL/hr over 60 Minutes Intravenous Every 24 hours 10/05/22 1641 10/05/22 1647          Medications  acyclovir  400 mg Oral BID   arformoterol  15 mcg Nebulization BID   budesonide (PULMICORT) nebulizer solution  0.5 mg Nebulization BID   calcium acetate  667  mg Oral TID WC   Chlorhexidine Gluconate Cloth  6 each Topical Daily   docusate sodium  100 mg Oral BID   gabapentin  100 mg Oral TID   medroxyPROGESTERone  10 mg Oral Daily   metoprolol tartrate  25 mg Oral BID   nicotine  21 mg Transdermal Q24H   pantoprazole  40 mg Oral Daily   polyethylene glycol  17 g Oral Daily   pravastatin  40 mg Oral Daily   revefenacin  175 mcg Nebulization Daily   sodium chloride flush  10 mL Intrapleural Q8H      Subjective:   Stacey Montoya was seen and examined today.  Currently no acute complaints, mother at the bedside.  States feeling better and urine output is improving.  Appears comfortable.  Patient denies dizziness, chest pain, shortness of breath, abdominal pain, N/V. No acute events overnight.    Objective:   Vitals:   10/14/22 0355 10/14/22 0805 10/14/22 0806 10/14/22 0807  BP: (!) 153/83     Pulse: 97     Resp: 15     Temp: 98.8 F (37.1 C)     TempSrc: Oral     SpO2: 97% 100% 100% 99%  Weight: 120.1 kg     Height:        Intake/Output Summary (Last 24 hours) at 10/14/2022 0955 Last data filed at 10/14/2022 0403 Gross per 24 hour  Intake 1637.33 ml  Output 2900 ml  Net -1262.67 ml     Wt Readings from Last 3 Encounters:  10/14/22 120.1 kg  09/16/22 118.5 kg  07/14/22 117.1 kg     Exam General: Alert and oriented x 3, NAD Cardiovascular: S1 S2 auscultated,  RRR Respiratory: Clear to auscultation bilaterally Gastrointestinal: Soft, nontender, nondistended, + bowel sounds Ext: no pedal edema bilaterally Neuro: Strength 5/5 upper and lower extremities bilaterally Skin: Right IJ temporary HD cath in place Psych: Normal affect     Data Reviewed:  I have personally reviewed following labs    CBC Lab Results  Component Value Date   WBC 4.0 10/14/2022   RBC 2.73 (L) 10/14/2022   HGB 8.7 (L) 10/14/2022   HCT 25.3 (L) 10/14/2022   MCV 92.7 10/14/2022   MCH 31.9 10/14/2022   PLT 104 (L) 10/14/2022   MCHC 34.4  10/14/2022   RDW 19.9 (H) 10/14/2022   LYMPHSABS 0.5 (L) 10/14/2022   MONOABS 0.6 10/14/2022   EOSABS 0.2 10/14/2022   BASOSABS 0.0 10/14/2022     Last metabolic panel Lab Results  Component Value Date   NA 133 (L) 10/14/2022   K 4.1 10/14/2022   CL 98 10/14/2022   CO2 18 (L) 10/14/2022   BUN 82 (H) 10/14/2022   CREATININE 7.29 (H) 10/14/2022   GLUCOSE 96 10/14/2022   GFRNONAA 7 (L) 10/14/2022   CALCIUM 7.8 (L) 10/14/2022   PHOS 9.1 (H) 10/14/2022   PROT 4.9 (L) 10/10/2022   ALBUMIN 1.9 (L) 10/14/2022   LABGLOB 3.0 09/16/2022   AGRATIO 0.5 (L) 04/03/2020   BILITOT 1.0 10/10/2022   ALKPHOS 117 10/10/2022   AST 43 (H) 10/10/2022   ALT 61 (H) 10/10/2022   ANIONGAP 17 (H) 10/14/2022    CBG (last 3)  Recent Labs    10/11/22 2007 10/12/22 0557  GLUCAP 104* 85      Coagulation Profile: No results for input(s): "INR", "PROTIME" in the last 168 hours.   Radiology Studies: I have personally reviewed the imaging studies  DG Chest 2 View  Result Date: 10/13/2022 CLINICAL DATA:  142230 Pleural effusion 142230. Shortness of breath. EXAM: CHEST - 2 VIEW COMPARISON:  Chest radiograph 10/12/2022. FINDINGS: 0611 hours. Unchanged right IJ approach central venous catheter with tip projecting over the low SVC. Interval removal of the right sided pleural drainage catheter. No residual pneumothorax. Unchanged moderate right pleural effusion with adjacent right basilar atelectasis. IMPRESSION: 1. Interval removal of the right sided pleural drainage catheter. No residual pneumothorax. 2. Unchanged moderate right pleural effusion with adjacent right basilar atelectasis. Electronically Signed   By: Orvan Falconer M.D.   On: 10/13/2022 10:16   DG CHEST PORT 1 VIEW  Result Date: 10/12/2022 CLINICAL DATA:  Pleural effusion with pleural drain in situ EXAM: PORTABLE CHEST 1 VIEW COMPARISON:  10/10/2022 FINDINGS: Right base collapse/consolidation with effusion is similar to prior. Right pleural  drain again noted. No evidence for right-sided pneumothorax. Right IJ central line tip overlies the mid SVC. Left lung clear. The cardiopericardial silhouette is within normal limits for size. Telemetry leads overlie the chest. IMPRESSION: Stable exam. Right base collapse/consolidation with effusion. No pneumothorax. Electronically Signed   By: Kennith Center M.D.   On: 10/12/2022 14:37       Stacey Montoya M.D. [facility] 10/14/2022, 9:55 AM  Available via Epic secure chat 7am-7pm After 7 pm, please refer to night coverage provider listed on amion.

## 2022-10-14 NOTE — Progress Notes (Signed)
Patient decline ambulation and said that she was not mentally prepared for that. Patient ambulate byself to the bathroom. We'll continue to monitor.

## 2022-10-14 NOTE — Progress Notes (Signed)
Case discussed with nephrologist this morning. Will hold off on clipping pt at this time. Will assist as needed.   Olivia Canter Renal Navigator [phone]

## 2022-10-15 DIAGNOSIS — N179 Acute kidney failure, unspecified: Secondary | ICD-10-CM | POA: Diagnosis not present

## 2022-10-15 DIAGNOSIS — J90 Pleural effusion, not elsewhere classified: Secondary | ICD-10-CM | POA: Diagnosis not present

## 2022-10-15 DIAGNOSIS — J9601 Acute respiratory failure with hypoxia: Secondary | ICD-10-CM | POA: Diagnosis not present

## 2022-10-15 LAB — RENAL FUNCTION PANEL
Albumin: 1.9 g/dL — ABNORMAL LOW (ref 3.5–5.0)
Anion gap: 15 (ref 5–15)
BUN: 95 mg/dL — ABNORMAL HIGH (ref 6–20)
CO2: 18 mmol/L — ABNORMAL LOW (ref 22–32)
Calcium: 8 mg/dL — ABNORMAL LOW (ref 8.9–10.3)
Chloride: 102 mmol/L (ref 98–111)
Creatinine, Ser: 7.85 mg/dL — ABNORMAL HIGH (ref 0.44–1.00)
GFR, Estimated: 6 mL/min — ABNORMAL LOW (ref >60)
Glucose, Bld: 96 mg/dL (ref 70–99)
Phosphorus: 9.8 mg/dL — ABNORMAL HIGH (ref 2.5–4.6)
Potassium: 4.4 mmol/L (ref 3.5–5.1)
Sodium: 135 mmol/L (ref 135–145)

## 2022-10-15 MED ORDER — CALCIUM ACETATE (PHOS BINDER) 667 MG PO CAPS: 667.0000 mg | ORAL_CAPSULE | ORAL | Status: DC | PRN

## 2022-10-15 MED ORDER — CALCIUM ACETATE (PHOS BINDER) 667 MG PO CAPS: 667.0000 mg | ORAL_CAPSULE | Freq: Two times a day (BID) | ORAL | Status: DC

## 2022-10-15 MED ORDER — CALCIUM ACETATE (PHOS BINDER) 667 MG PO CAPS
1334.0000 mg | ORAL_CAPSULE | Freq: Two times a day (BID) | ORAL | Status: DC
  Administered 2022-10-15 – 2022-10-19 (×8): 1334 mg via ORAL
  Filled 2022-10-15 (×9): qty 2

## 2022-10-15 NOTE — Plan of Care (Signed)
Poc progressing.  

## 2022-10-15 NOTE — Progress Notes (Signed)
Mobility Specialist Progress Note:   10/15/22 1100  Mobility  Activity Ambulated with assistance in hallway  Level of Assistance Standby assist, set-up cues, supervision of patient - no hands on  Assistive Device Four wheel walker  Distance Ambulated (ft) 940 ft  Activity Response Tolerated well  Mobility Referral Yes  $Mobility charge 1 Mobility  Mobility Specialist Start Time (ACUTE ONLY) 1106  Mobility Specialist Stop Time (ACUTE ONLY) 1125  Mobility Specialist Time Calculation (min) (ACUTE ONLY) 19 min    Pre Mobility: 99 HR,  149/80 (100) BP During Mobility: 125 HR Post Mobility:  105 HR  Pt received in bed, agreeable to mobility. Some audible SOB with increased distance. Otherwise asymptomatic. Pt left in bed with call bell and family present.  D'Vante Earlene Plater Mobility Specialist Please contact via Special educational needs teacher or Rehab office at [phone]

## 2022-10-15 NOTE — Progress Notes (Addendum)
[facility] PROGRESS [facility]  Assessment/ Plan: Pt is a 47 y.o. yo female  with AKI after presenting with loculated parapneumonic effusion, hx/o autologous SCT for MM   # Acute kidney injury on CKD 3, oliguric and now dialysis dependent.  This is likely ATN due to Toradol, + fluid shift from draining pleural effusion/hemodynamic changes.  UA, US renal unremarkable.  Hep B, hep C, HIV, kappa lambda ratio unremarkable  RIJ temp HD catheter placed on 8/24 by CCM and received first HD and second HD on 8/26. The urine output is significantly improved around 3.7 L however, the creatinine level is trending up.  Clinically much better without any sign or symptoms of uremia.  I am planning to continue to hold dialysis and watch for renal recovery.  Please keep HD catheter in place for next few days. I have discussed with the patient and her mother today.  # Loculated parapneumonic effusion status post chest tube and pleurodesis per pulmonary team.  # Hx/o MM and autologous SCT at [facility] 2022; neg rpt studies here  # Hyponatremia, hypervolemic: UF with HD.  Continue fluid restriction.  # Hypokalemia: Improved.  # Hypocalcemia: Corrected calcium level is acceptable  # Anemia: Iron saturation 10%.  I have ordered IV iron.  Received blood transfusion as well. Monitor CBC.    # Hypertension/volume: On metoprolol, UF with HD.  Also having auto diuresis.  # Hyperphosphatemia: Phosphorus level is going up therefore increasing calcium acetate to 2 pills with each meal and 1 with snacks.  Subjective: Seen and examined at the bedside.  Urine output is recorded around 3.7 L.  She reports feeling great without any nausea, vomiting, chest pain or shortness of breath.  Her mother was presented with her.   Objective Vital signs in last 24 hours: Vitals:   10/14/22 2247 10/15/22 0309 10/15/22 0747 10/15/22 0751  BP: (!) 142/76 137/78  (!) 152/77  Pulse:   91   Resp: 11 11  11   Temp: 98.6  F (37 C) 98.2 F (36.8 C)  97.9 F (36.6 C)  TempSrc: Oral Oral  Oral  SpO2: 96%  97%   Weight:  118.2 kg    Height:       Weight change: -1.9 kg  Intake/Output Summary (Last 24 hours) at 10/15/2022 1022 Last data filed at 10/15/2022 0409 Gross per 24 hour  Intake --  Output 3300 ml  Net -3300 ml       Labs: RENAL PANEL Recent Labs  Lab 10/09/22 0429 10/09/22 1552 10/10/22 0434 10/11/22 0355 10/12/22 0623 10/13/22 0548 10/14/22 0418 10/15/22 0425  NA 123* 119* 121* 127* 129* 131* 133* 135  K 4.0 3.8 4.0 3.3* 3.4* 3.3* 4.1 4.4  CL 89* 89* 89* 93* 93* 94* 98 102  CO2 12* 14* 14* 18* 16* 20* 18* 18*  GLUCOSE 105* 93 96 90 92 94 96 96  BUN 67* 77* 79* 56* 78* 67* 82* 95*  CREATININE 6.37* 6.97* 7.75* 6.21* 7.79* 6.55* 7.29* 7.85*  CALCIUM 6.2* 6.1* 6.0* 6.4* 6.8* 7.2* 7.8* 8.0*  MG  --   --  1.7  --   --   --   --   --   PHOS  --  8.5*  --   --   --  7.5* 9.1* 9.8*  ALBUMIN 2.0* 1.8* 1.8*  --   --   --  1.9* 1.9*    Liver Function Tests: Recent Labs  Lab 10/09/22 0429 10/09/22 1552  10/10/22 0434 10/14/22 0418 10/15/22 0425  AST 45*  --  43*  --   --   ALT 78*  --  61*  --   --   ALKPHOS 111  --  117  --   --   BILITOT 1.2  --  1.0  --   --   PROT 5.0*  --  4.9*  --   --   ALBUMIN 2.0*   < > 1.8* 1.9* 1.9*   < > = values in this interval not displayed.   No results for input(s): "LIPASE", "AMYLASE" in the last 168 hours. No results for input(s): "AMMONIA" in the last 168 hours. CBC: Recent Labs    10/11/22 1056 10/12/22 0623 10/13/22 0548 10/13/22 1518 10/14/22 0418  HGB 7.2* 7.2* 6.8* 8.0* 8.7*  MCV 92.6 91.6 92.9  --  92.7  FERRITIN  --   --  708*  --   --   TIBC  --   --  199*  --   --   IRON  --   --  20*  --   --     Cardiac Enzymes: Recent Labs  Lab 10/09/22 0429  CKTOTAL 386*   CBG: Recent Labs  Lab 10/11/22 2007 10/12/22 0557  GLUCAP 104* 85    Iron Studies:  Recent Labs    10/13/22 0548  IRON 20*  TIBC 199*   FERRITIN 708*   Studies/Results: No results found.  Medications: Infusions:  cefTRIAXone (ROCEPHIN)  IV 2 g (10/14/22 1745)   ferric gluconate (FERRLECIT) IVPB 250 mg (10/14/22 1053)    Scheduled Medications:  acyclovir  400 mg Oral BID   arformoterol  15 mcg Nebulization BID   budesonide (PULMICORT) nebulizer solution  0.5 mg Nebulization BID   calcium acetate  667 mg Oral BID WC   Chlorhexidine Gluconate Cloth  6 each Topical Daily   docusate sodium  100 mg Oral BID   gabapentin  100 mg Oral TID   heparin injection (subcutaneous)  5,000 Units Subcutaneous Q8H   medroxyPROGESTERone  10 mg Oral Daily   metoprolol tartrate  25 mg Oral BID   nicotine  21 mg Transdermal Q24H   pantoprazole  40 mg Oral Daily   polyethylene glycol  17 g Oral Daily   pravastatin  40 mg Oral Daily   revefenacin  175 mcg Nebulization Daily   sodium chloride flush  10 mL Intrapleural Q8H    have reviewed scheduled and prn medications.  Physical Exam: General:NAD, comfortable Heart:RRR, s1s2 nl Lungs: Clear bilateral. Abdomen:soft, Non-tender, non-distended Extremities:No edema Dialysis Access: Right IJ temporary HD catheter in place, site clean.  Yaret Hush Prasad Javion Holmer 10/15/2022,10:22 AM  LOS: 10 days

## 2022-10-15 NOTE — Progress Notes (Signed)
[facility]                                                                              Stacey Montoya, is a 47 y.o. female, DOB - 01/04/1976, ZOX:[id number] Admit date - 10/05/2022    Outpatient Primary MD for the patient is [name], [name], MD  LOS - 10  days  Chief Complaint  Patient presents with   Shortness of Breath       Brief summary   Patient is a 47 year old female with HTN, HLP, multiple myeloma s/p chemotherapy, now in remission, DVT on Eliquis presented to ED with 3 days of worsening shortness of breath.  She was found to have right-sided upper lobe pulmonary consolidation, loculated pleural effusion, AKI.  She was placed on antibiotics, underwent right chest tube placement on 10/06/2022.  Nephrology, cardiothoracic surgery also consulted.  She had insertion of nontunneled CVC catheter placement by PCCM on 8/24, HD started on 10/10/2022.   Assessment & Plan    Principal Problem:   Acute respiratory failure with hypoxia (HCC) Community-acquired pneumonia, right-sided with loculated parapneumonic effusion -chest tube was placed and removed on 10/12/2022.  Patient received pleural fibrinolytics as per pulmonology -Completed doxycycline, currently on Rocephin -Continue bronchodilators, pain control, mobilize, outpatient PFTs -Stable, O2 sats 97% on room air  Active Problems:   AKI (acute kidney injury) (HCC) on CKD stage IIIa, non-anion gap metabolic acidosis -Nephrology consulted, likely ATN due to Toradol and fluid shift with draining pleural effusion, hemodynamic changes. -Renal ultrasound unremarkable. -Right IJ temp HD catheter placed on 8/24 by PCCM, started on HD on 8/24 -Received second HD on 8/26. -Urine output stable and improving, CLIP process on hold -Creatinine still trending up, 7.8, hopefully will plateau and expecting renal recovery    Normocytic anemia, anemia of chronic disease -From renal failure, multiple myeloma, chronic  illness -Transfused 1 unit packed RBCs on 8/25, 8/27 -H&H stable    Multiple myeloma (HCC) -Revlimid on hold until reevaluation by Dr. Candise Che as outpatient  Leukopenia, -Resolved  Thrombocytopenia (HCC) -Improving  Hyponatremia -Improving  Hypocalcemia -Albumin on 8/24 1.8 -Improving    Obesity, Class III, BMI 40-49.9 (morbid obesity) (HCC) Estimated body mass index is 45.8 kg/m as calculated from the following:   Height as of this encounter: 5' 3.25" (1.607 m).   Weight as of this encounter: 118.2 kg.  Code Status: Full code DVT Prophylaxis:  heparin injection 5,000 Units Start: 10/14/22 1430 SCDs Start: 10/05/22 1639   Level of Care: Level of care: Progressive Family Communication: Updated patient's mother at bedside Disposition Plan:      Remains inpatient appropriate:   Will likely need a few days until renal function recovery.  If patient does not need HD, will need HD catheter removal.   Procedures:  Chest tube placement and removal HD  Consultants:   Pulmonary critical care CT surgery Nephrology  Antimicrobials:   Anti-infectives (From admission, onward)    Start     Dose/Rate Route Frequency Ordered Stop   10/05/22 2200  acyclovir (ZOVIRAX) tablet 400 mg        400 mg Oral 2 times daily 10/05/22  1640     10/05/22 2000  cefTRIAXone (ROCEPHIN) 2 g in sodium chloride 0.9 % 100 mL IVPB        2 g 200 mL/hr over 30 Minutes Intravenous Daily-1800 10/05/22 1909     10/05/22 1800  doxycycline (VIBRAMYCIN) 100 mg in sodium chloride 0.9 % 250 mL IVPB        100 mg 125 mL/hr over 120 Minutes Intravenous Every 12 hours 10/05/22 1647 10/10/22 1759   10/05/22 1645  levofloxacin (LEVAQUIN) IVPB 500 mg  Status:  Discontinued        500 mg 100 mL/hr over 60 Minutes Intravenous Every 24 hours 10/05/22 1641 10/05/22 1641   10/05/22 1645  Levofloxacin (LEVAQUIN) IVPB 250 mg  Status:  Discontinued        250 mg 50 mL/hr over 60 Minutes Intravenous Every 24 hours  10/05/22 1641 10/05/22 1647          Medications  acyclovir  400 mg Oral BID   arformoterol  15 mcg Nebulization BID   budesonide (PULMICORT) nebulizer solution  0.5 mg Nebulization BID   calcium acetate  667 mg Oral BID WC   Chlorhexidine Gluconate Cloth  6 each Topical Daily   docusate sodium  100 mg Oral BID   gabapentin  100 mg Oral TID   heparin injection (subcutaneous)  5,000 Units Subcutaneous Q8H   medroxyPROGESTERone  10 mg Oral Daily   metoprolol tartrate  25 mg Oral BID   nicotine  21 mg Transdermal Q24H   pantoprazole  40 mg Oral Daily   polyethylene glycol  17 g Oral Daily   pravastatin  40 mg Oral Daily   revefenacin  175 mcg Nebulization Daily   sodium chloride flush  10 mL Intrapleural Q8H      Subjective:   Stacey Montoya was seen and examined today.  Feeling somewhat frustrated however urine output is improving and feeling better, no nausea or vomiting, shortness of breath.  Mother at the bedside.    Objective:   Vitals:   10/15/22 0309 10/15/22 0747 10/15/22 0751 10/15/22 1207  BP: 137/78  (!) 152/77 (!) 146/79  Pulse:  91    Resp: 11  11 17   Temp: 98.2 F (36.8 C)  97.9 F (36.6 C) 98.6 F (37 C)  TempSrc: Oral  Oral Oral  SpO2:  97%    Weight: 118.2 kg     Height:        Intake/Output Summary (Last 24 hours) at 10/15/2022 1317 Last data filed at 10/15/2022 1100 Gross per 24 hour  Intake --  Output 3800 ml  Net -3800 ml     Wt Readings from Last 3 Encounters:  10/15/22 118.2 kg  09/16/22 118.5 kg  07/14/22 117.1 kg    Physical Exam General: Alert and oriented x 3, NAD Cardiovascular: S1 S2 clear, RRR.  Respiratory: CTAB, no wheezing, rales or rhonchi Gastrointestinal: Soft, nontender, nondistended, NBS Ext: no pedal edema bilaterally, compression socks+ Neuro: no new deficits Skin: Right IJ HD cath Psych: Normal affect     Data Reviewed:  I have personally reviewed following labs    CBC Lab Results  Component Value  Date   WBC 4.0 10/14/2022   RBC 2.73 (L) 10/14/2022   HGB 8.7 (L) 10/14/2022   HCT 25.3 (L) 10/14/2022   MCV 92.7 10/14/2022   MCH 31.9 10/14/2022   PLT 104 (L) 10/14/2022   MCHC 34.4 10/14/2022   RDW 19.9 (H) 10/14/2022   LYMPHSABS 0.5 (  L) 10/14/2022   MONOABS 0.6 10/14/2022   EOSABS 0.2 10/14/2022   BASOSABS 0.0 10/14/2022     Last metabolic panel Lab Results  Component Value Date   NA 135 10/15/2022   K 4.4 10/15/2022   CL 102 10/15/2022   CO2 18 (L) 10/15/2022   BUN 95 (H) 10/15/2022   CREATININE 7.85 (H) 10/15/2022   GLUCOSE 96 10/15/2022   GFRNONAA 6 (L) 10/15/2022   CALCIUM 8.0 (L) 10/15/2022   PHOS 9.8 (H) 10/15/2022   PROT 4.9 (L) 10/10/2022   ALBUMIN 1.9 (L) 10/15/2022   LABGLOB 3.0 09/16/2022   AGRATIO 0.5 (L) 04/03/2020   BILITOT 1.0 10/10/2022   ALKPHOS 117 10/10/2022   AST 43 (H) 10/10/2022   ALT 61 (H) 10/10/2022   ANIONGAP 15 10/15/2022    CBG (last 3)  No results for input(s): "GLUCAP" in the last 72 hours.     Coagulation Profile: No results for input(s): "INR", "PROTIME" in the last 168 hours.   Radiology Studies: I have personally reviewed the imaging studies  No results found.     Thad Ranger M.D. [facility] 10/15/2022, 1:17 PM  Available via Epic secure chat 7am-7pm After 7 pm, please refer to night coverage provider listed on amion.

## 2022-10-16 DIAGNOSIS — J90 Pleural effusion, not elsewhere classified: Secondary | ICD-10-CM | POA: Diagnosis not present

## 2022-10-16 DIAGNOSIS — N179 Acute kidney failure, unspecified: Secondary | ICD-10-CM | POA: Diagnosis not present

## 2022-10-16 DIAGNOSIS — J9601 Acute respiratory failure with hypoxia: Secondary | ICD-10-CM | POA: Diagnosis not present

## 2022-10-16 LAB — RENAL FUNCTION PANEL
Albumin: 2 g/dL — ABNORMAL LOW (ref 3.5–5.0)
Anion gap: 14 (ref 5–15)
BUN: 104 mg/dL — ABNORMAL HIGH (ref 6–20)
CO2: 18 mmol/L — ABNORMAL LOW (ref 22–32)
Calcium: 8.5 mg/dL — ABNORMAL LOW (ref 8.9–10.3)
Chloride: 107 mmol/L (ref 98–111)
Creatinine, Ser: 7.88 mg/dL — ABNORMAL HIGH (ref 0.44–1.00)
GFR, Estimated: 6 mL/min — ABNORMAL LOW (ref >60)
Glucose, Bld: 105 mg/dL — ABNORMAL HIGH (ref 70–99)
Phosphorus: 9.2 mg/dL — ABNORMAL HIGH (ref 2.5–4.6)
Potassium: 4.3 mmol/L (ref 3.5–5.1)
Sodium: 139 mmol/L (ref 135–145)

## 2022-10-16 LAB — CBC
HCT: 23.9 % — ABNORMAL LOW (ref 36.0–46.0)
Hemoglobin: 7.9 g/dL — ABNORMAL LOW (ref 12.0–15.0)
MCH: 31.2 pg (ref 26.0–34.0)
MCHC: 33.1 g/dL (ref 30.0–36.0)
MCV: 94.5 fL (ref 80.0–100.0)
Platelets: 122 10*3/uL — ABNORMAL LOW (ref 150–400)
RBC: 2.53 MIL/uL — ABNORMAL LOW (ref 3.87–5.11)
RDW: 20.2 % — ABNORMAL HIGH (ref 11.5–15.5)
WBC: 4.4 10*3/uL (ref 4.0–10.5)
nRBC: 0 % (ref 0.0–0.2)

## 2022-10-16 IMAGING — DX DG FEMUR 2+V PORT*L*
4 series · 4 of 4 positions shown · non-contrast
Comparison: Femur exam on bone survey earlier today. Included
portion from CT earlier today.

CLINICAL DATA: Bilateral thigh pain.  History of myeloma.

EXAM:
LEFT FEMUR PORTABLE 2 VIEWS

[femur ap proximal (1 of 2)]
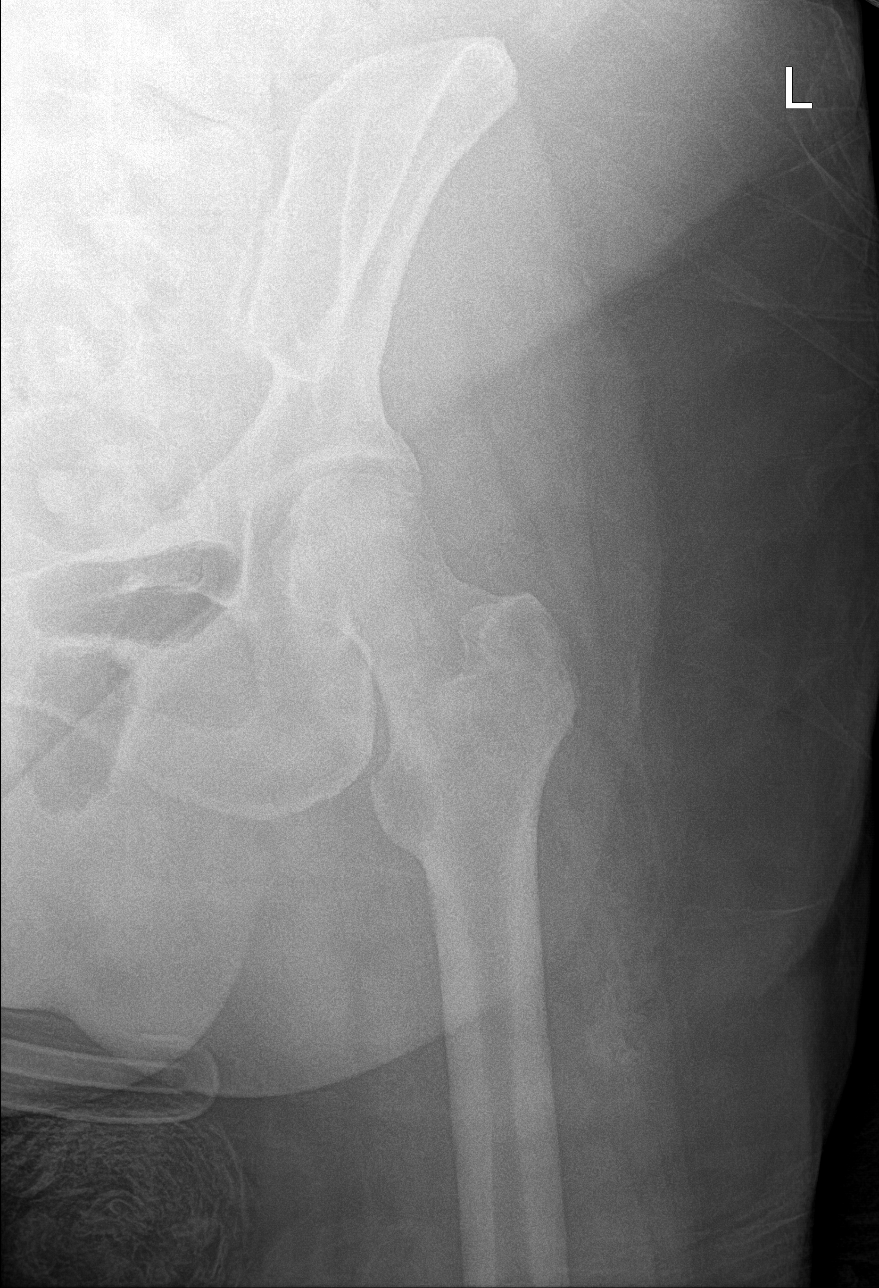

[femur ap proximal (2 of 2)]
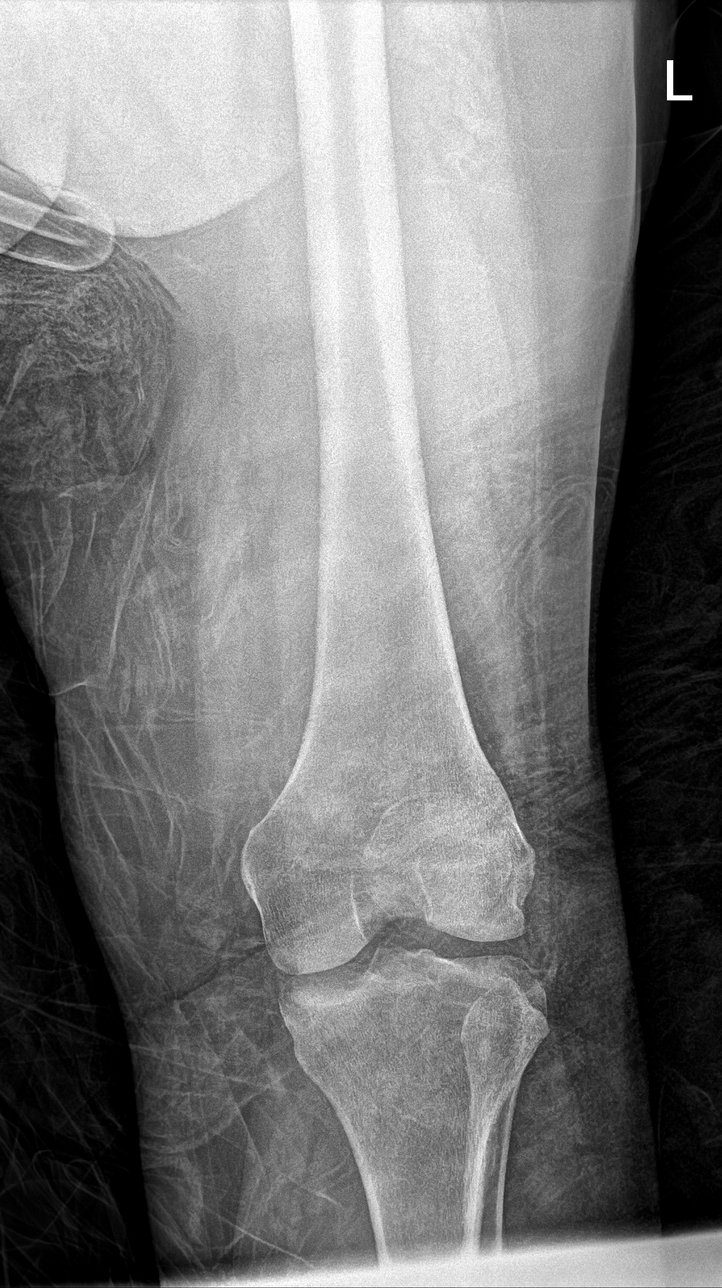

[femur lat (1 of 2)]
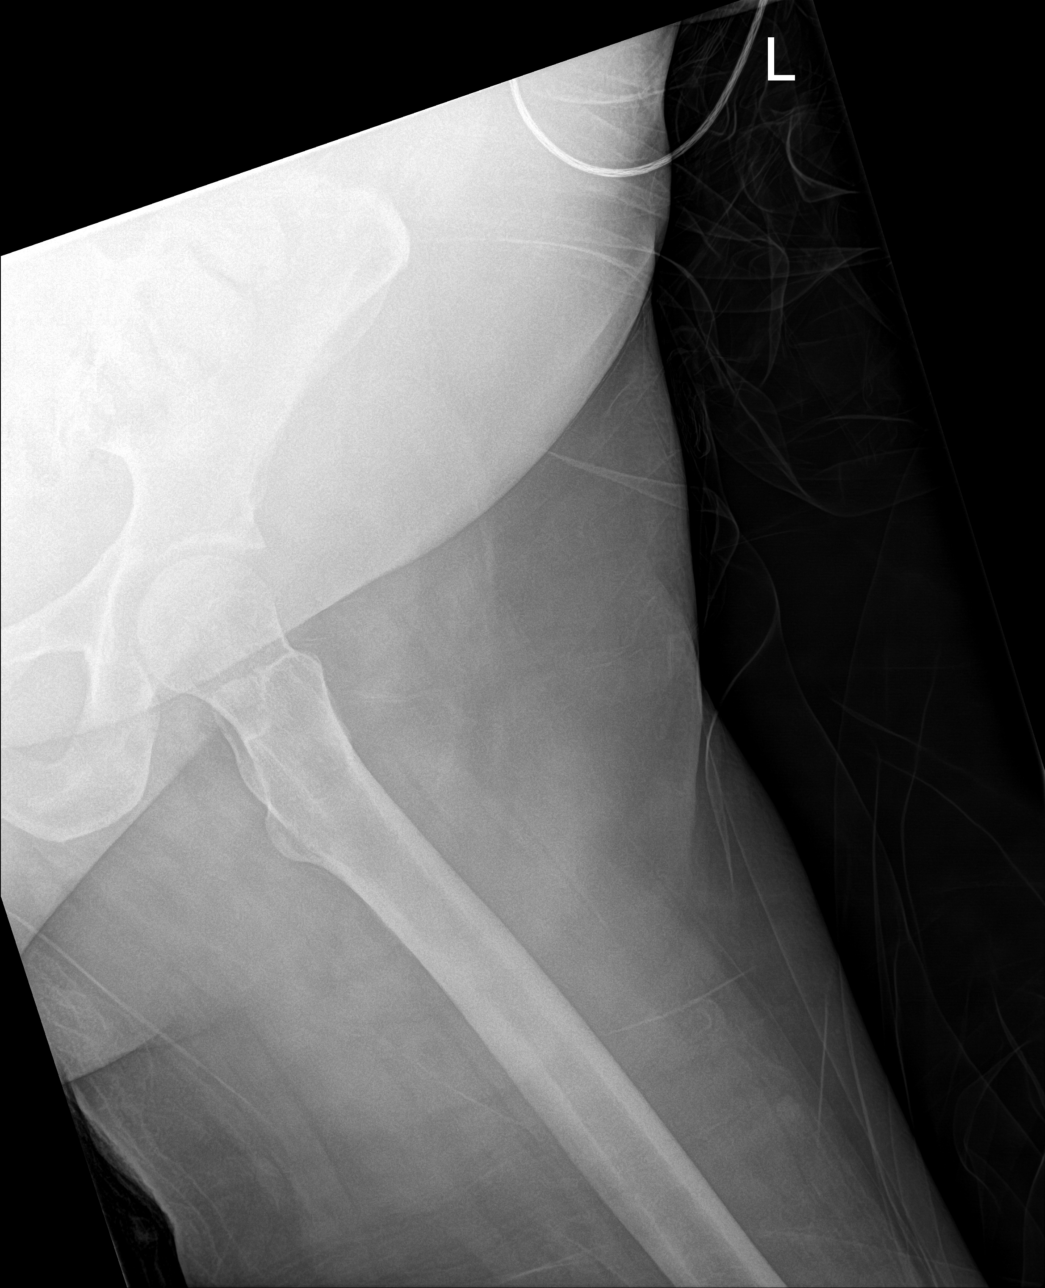

[femur lat (2 of 2)]
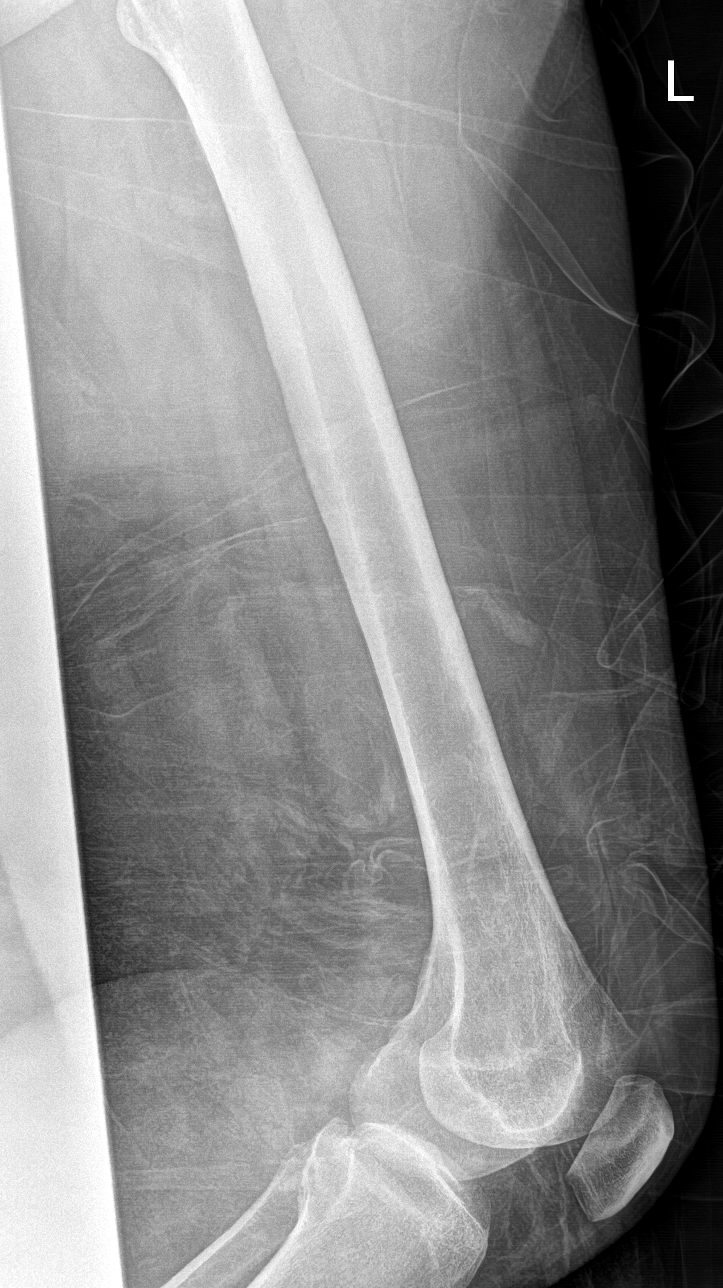

[4 of 4 positions shown; findings below may reference images not displayed]

FINDINGS: Lucent lesion in the lesser trochanter was better appreciated on CT
earlier today. There are multiple small lucencies involving the
femoral head and intertrochanteric femur that are not well seen by
radiograph. No obvious destructive lesion in the femoral shaft. No
evidence of pathologic fracture.
IMPRESSION: Proximal femoral lesions including a lesion of the lesser
trochanter, majority of these are better seen on CT earlier today.
There is no evidence of acute femur fracture or large destructive
lesion. Consider MRI for more detailed assessment.

## 2022-10-16 IMAGING — DX DG FEMUR 2+V PORT*R*
4 series · 4 of 4 positions shown · non-contrast
Comparison: Included portion from bone survey earlier today.
Included portion from pelvis CT earlier today.

CLINICAL DATA: Right thigh pain.  Myeloma.

EXAM:
RIGHT FEMUR PORTABLE 2 VIEW

[femur ap proximal (1 of 2)]
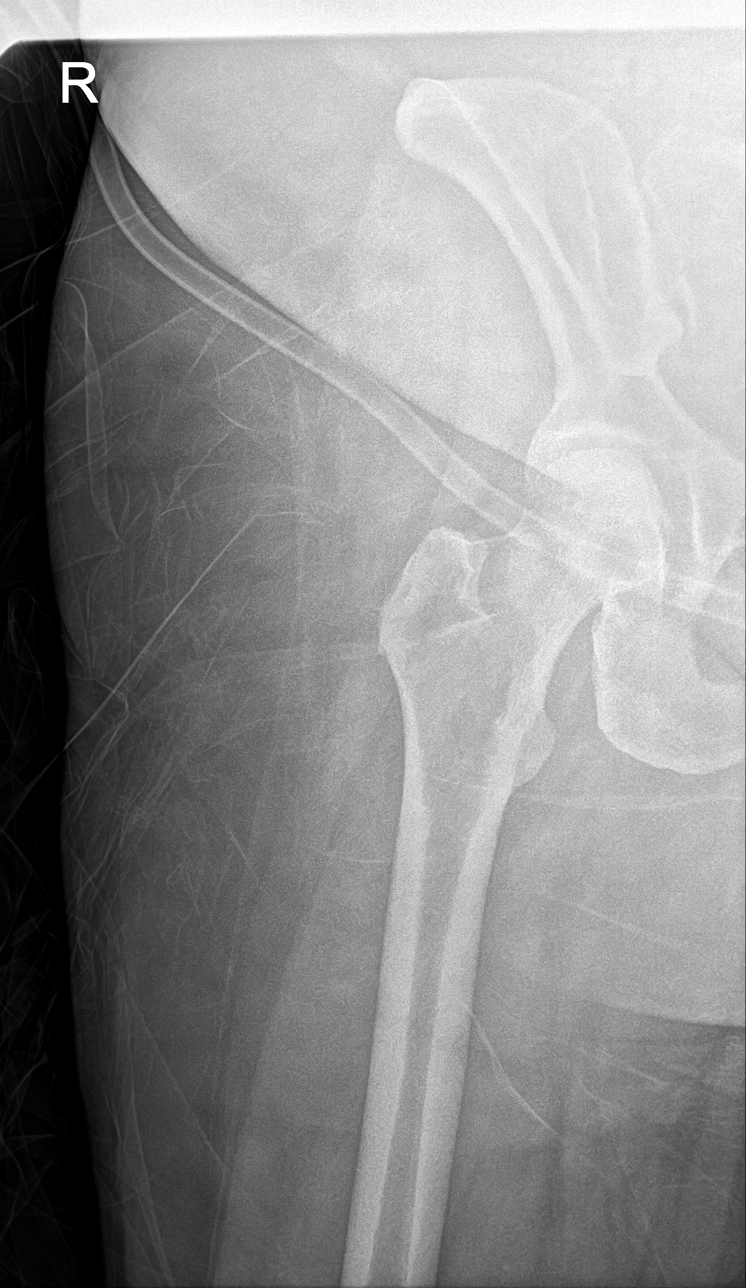

[femur ap proximal (2 of 2)]
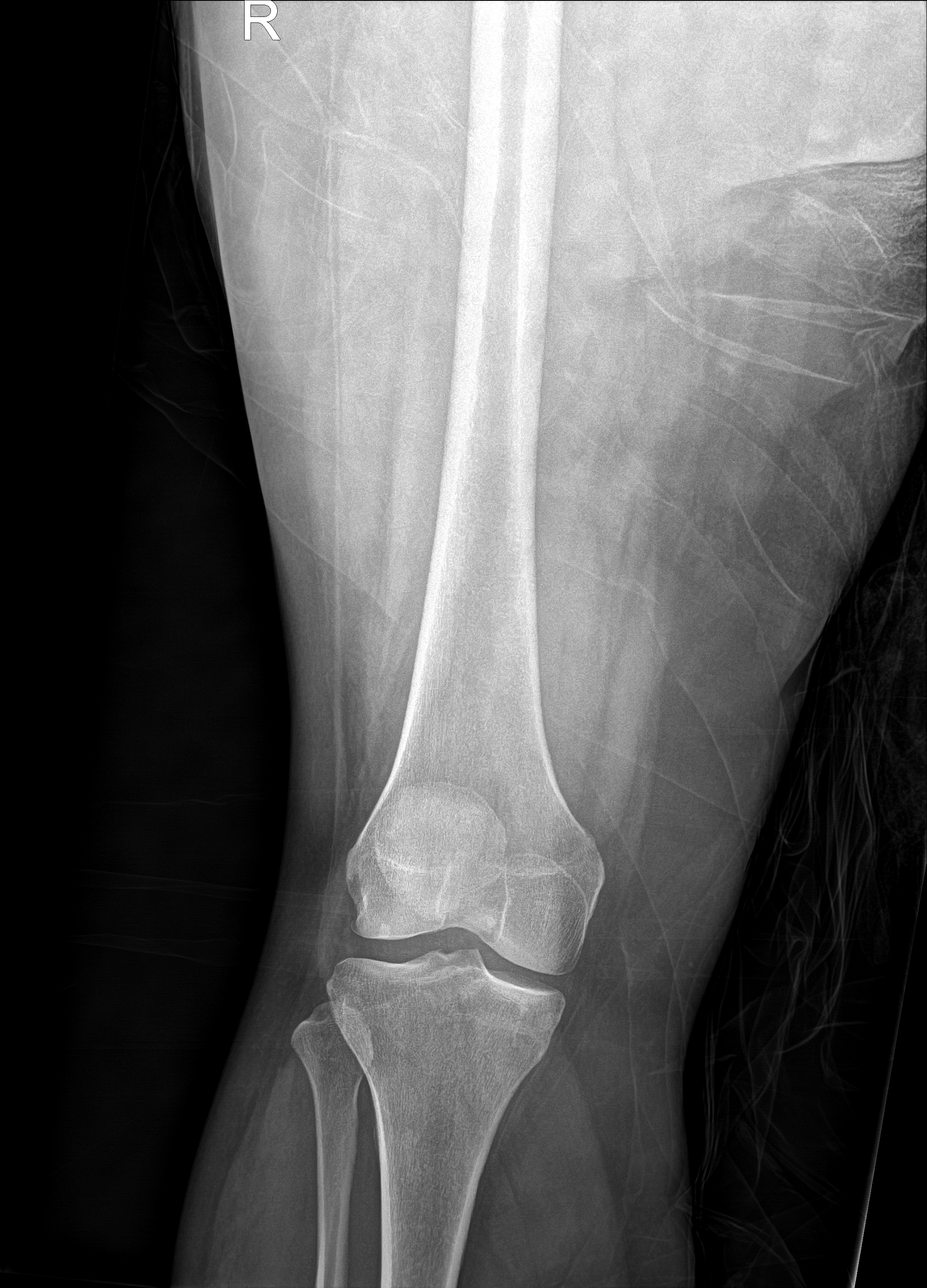

[femur lat (1 of 2)]
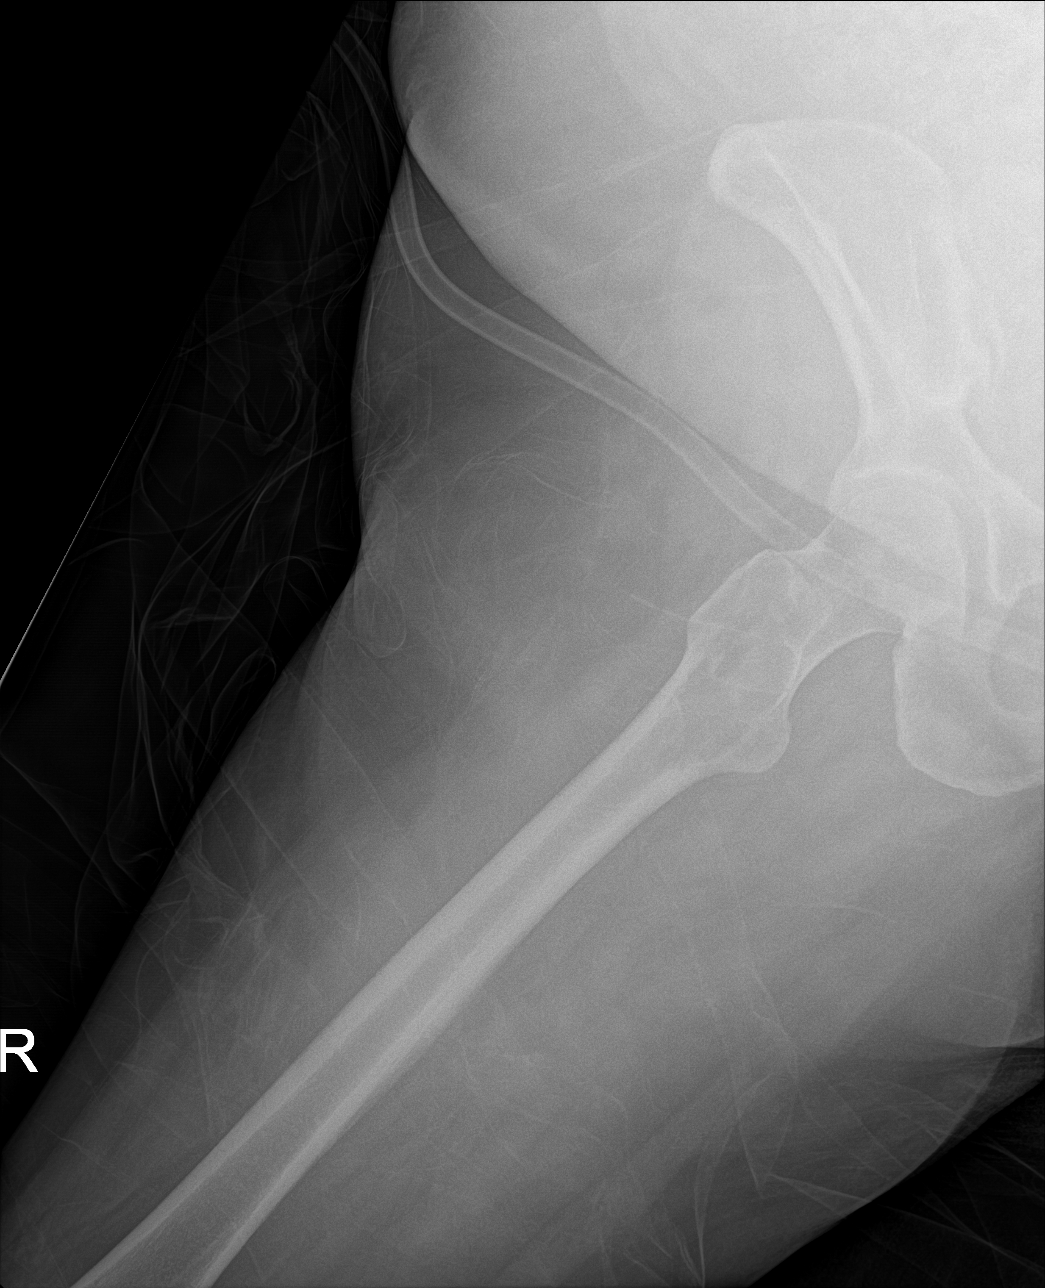

[femur lat (2 of 2)]
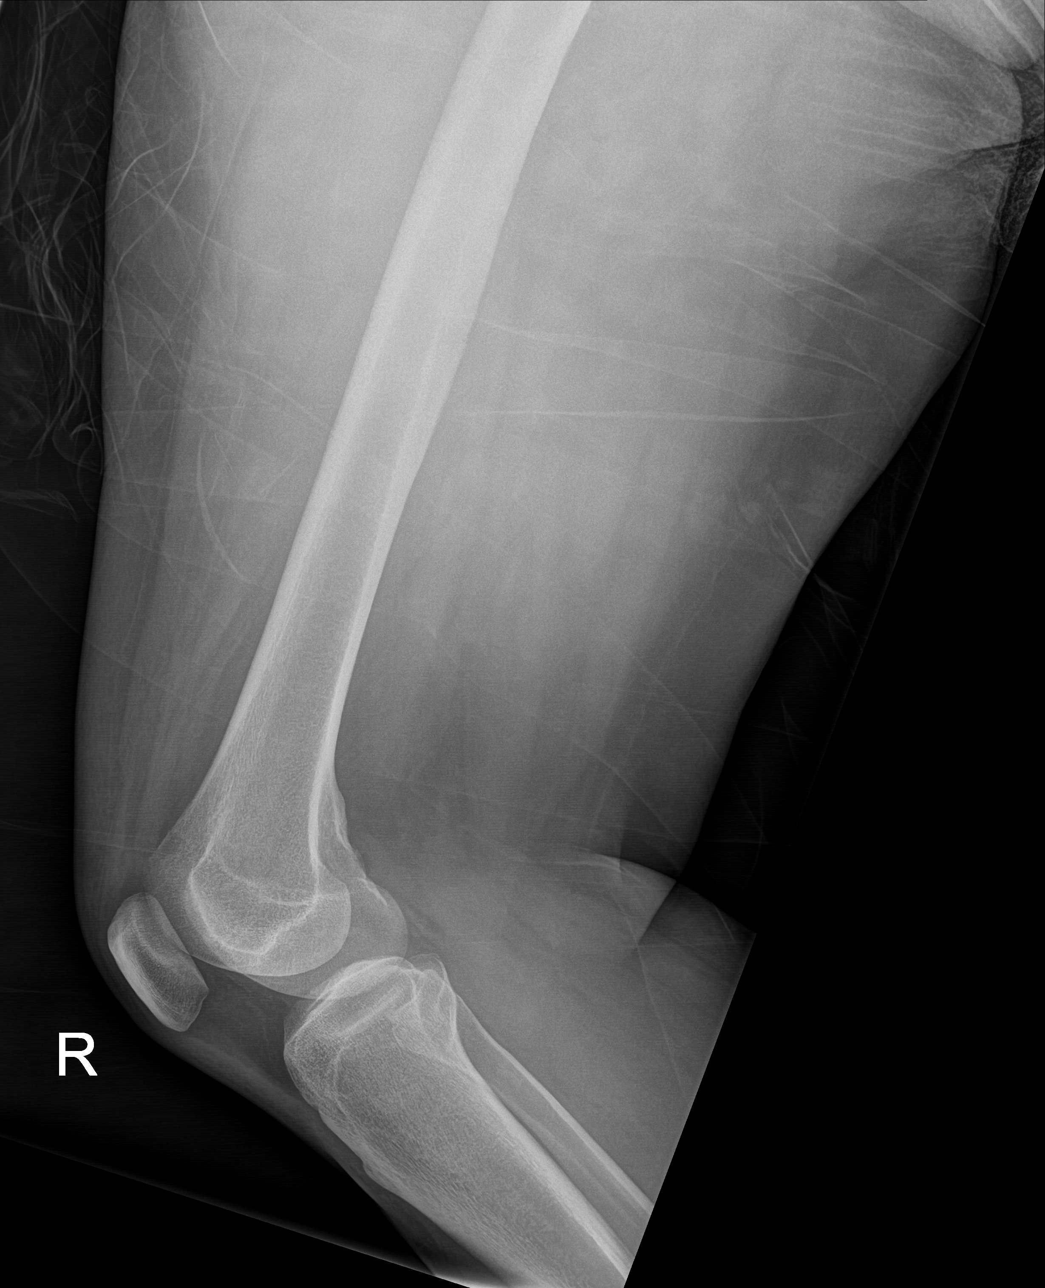

[4 of 4 positions shown; findings below may reference images not displayed]

FINDINGS: Lucent lesion in the intertrochanteric femur on CT earlier today is
only faintly visualized, this is at risk for pathologic fracture.
There is some thinning of the lateral cortex of the greater
trochanter. There are multiple additional small lucencies in the
femoral head on CT that are not well seen by radiograph. Small
lesion noted in the distal lateral aspect of the femur in the region
of the physeal scar. No evidence of acute fracture.
IMPRESSION: Relatively large lucent lesion in the intertrochanteric femur on CT
earlier today is only faintly visualized, this is at risk for
pathologic fracture. There are multiple additional lucencies in the
femoral head on CT are not well seen by radiograph. There is a small
lesion in the distal lateral femoral metaphysis. MRI may be of value
for more detailed assessment.

## 2022-10-16 NOTE — Progress Notes (Signed)
[facility] NEPHROLOGY PROGRESS NOTE  Assessment/ Plan: Pt is a 47 y.o. yo female  with AKI after presenting with loculated parapneumonic effusion, hx/o autologous SCT for MM   # Acute kidney injury on CKD 3, oliguric and now dialysis dependent.  This is likely ATN due to Toradol, + fluid shift from draining pleural effusion/hemodynamic changes.  UA, US renal unremarkable.  Hep B, hep C, HIV, kappa lambda ratio unremarkable  RIJ temp HD catheter placed on 8/24 by CCM and received first HD and second HD on 8/26. The urine output  significantly improved - 3.7 L however, the creatinine level is trending up-  but maybe trying to plateau today.   without any sign or symptoms of uremia.  continue to hold further dialysis and watch for renal recovery.   keep HD catheter in place for next few days. I have discussed with the patient   # Loculated parapneumonic effusion status post chest tube and pleurodesis per pulmonary team.  # Hx/o MM and autologous SCT at [facility] 2022; neg rpt studies here  # Hyponatremia, hypervolemic: UF with HD.  Continue fluid restriction- resolved.  # Hypokalemia: Improved.  # Hypocalcemia: Corrected calcium level is acceptable  # Anemia: Iron saturation 10%.  ordered IV iron.  Received blood transfusion as well. Monitor CBC.  Not sure what the rules are re ESA  # Hypertension/volume: On metoprolol.  Also having auto diuresis.  # Hyperphosphatemia: increasing calcium acetate to 2 pills with each meal and 1 with snacks.  Subjective: Seen and examined at the bedside.  Urine output is recorded around 3.8 L- no diuretics.  BUN and crt similar to yesterday-  no uremic sxms   Objective Vital signs in last 24 hours: Vitals:   10/15/22 2352 10/16/22 0611 10/16/22 0758 10/16/22 0835  BP: (!) 151/92 (!) 150/72 (!) 151/81   Pulse: 94 91 84 85  Resp: 15 13 20 14   Temp: 99.2 F (37.3 C) 98.6 F (37 C) 98.8 F (37.1 C)   TempSrc: Oral Oral Oral   SpO2: 95% 97%  98% 98%  Weight:  117.2 kg    Height:       Weight change: -0.991 kg  Intake/Output Summary (Last 24 hours) at 10/16/2022 0911 Last data filed at 10/16/2022 0300 Gross per 24 hour  Intake 120 ml  Output 3800 ml  Net -3680 ml       Labs: RENAL PANEL Recent Labs  Lab 10/09/22 1552 10/10/22 0434 10/11/22 0355 10/12/22 0623 10/13/22 0548 10/14/22 0418 10/15/22 0425 10/16/22 0510  NA 119* 121*   < > 129* 131* 133* 135 139  K 3.8 4.0   < > 3.4* 3.3* 4.1 4.4 4.3  CL 89* 89*   < > 93* 94* 98 102 107  CO2 14* 14*   < > 16* 20* 18* 18* 18*  GLUCOSE 93 96   < > 92 94 96 96 105*  BUN 77* 79*   < > 78* 67* 82* 95* 104*  CREATININE 6.97* 7.75*   < > 7.79* 6.55* 7.29* 7.85* 7.88*  CALCIUM 6.1* 6.0*   < > 6.8* 7.2* 7.8* 8.0* 8.5*  MG  --  1.7  --   --   --   --   --   --   PHOS 8.5*  --   --   --  7.5* 9.1* 9.8* 9.2*  ALBUMIN 1.8* 1.8*  --   --   --  1.9* 1.9* 2.0*   < > =  values in this interval not displayed.    Liver Function Tests: Recent Labs  Lab 10/10/22 0434 10/14/22 0418 10/15/22 0425 10/16/22 0510  AST 43*  --   --   --   ALT 61*  --   --   --   ALKPHOS 117  --   --   --   BILITOT 1.0  --   --   --   PROT 4.9*  --   --   --   ALBUMIN 1.8* 1.9* 1.9* 2.0*   No results for input(s): "LIPASE", "AMYLASE" in the last 168 hours. No results for input(s): "AMMONIA" in the last 168 hours. CBC: Recent Labs    10/12/22 0623 10/13/22 0548 10/13/22 1518 10/14/22 0418 10/16/22 0510  HGB 7.2* 6.8* 8.0* 8.7* 7.9*  MCV 91.6 92.9  --  92.7 94.5  FERRITIN  --  708*  --   --   --   TIBC  --  199*  --   --   --   IRON  --  20*  --   --   --     Cardiac Enzymes: No results for input(s): "CKTOTAL", "CKMB", "CKMBINDEX", "TROPONINI" in the last 168 hours.  CBG: Recent Labs  Lab 10/11/22 2007 10/12/22 0557  GLUCAP 104* 85    Iron Studies:  No results for input(s): "IRON", "TIBC", "TRANSFERRIN", "FERRITIN" in the last 72 hours.  Studies/Results: No results  found.  Medications: Infusions:  cefTRIAXone (ROCEPHIN)  IV 2 g (10/15/22 1728)    Scheduled Medications:  acyclovir  400 mg Oral BID   arformoterol  15 mcg Nebulization BID   budesonide (PULMICORT) nebulizer solution  0.5 mg Nebulization BID   calcium acetate  1,334 mg Oral BID WC   Chlorhexidine Gluconate Cloth  6 each Topical Daily   docusate sodium  100 mg Oral BID   gabapentin  100 mg Oral TID   heparin injection (subcutaneous)  5,000 Units Subcutaneous Q8H   medroxyPROGESTERone  10 mg Oral Daily   metoprolol tartrate  25 mg Oral BID   nicotine  21 mg Transdermal Q24H   pantoprazole  40 mg Oral Daily   polyethylene glycol  17 g Oral Daily   pravastatin  40 mg Oral Daily   revefenacin  175 mcg Nebulization Daily   sodium chloride flush  10 mL Intrapleural Q8H    have reviewed scheduled and prn medications.  Physical Exam: General:NAD, comfortable Heart:RRR, s1s2 nl Lungs: Clear bilateral. Abdomen:soft, Non-tender, non-distended Extremities:min edema Dialysis Access: Right IJ temporary HD catheter in place for 5 days , site clean.  Stacey Montoya 10/16/2022,9:11 AM  LOS: 11 days

## 2022-10-16 NOTE — Progress Notes (Signed)
Mobility Specialist Progress Note:   10/16/22 1100  Mobility  Activity Ambulated with assistance in hallway  Level of Assistance Modified independent, requires aide device or extra time  Assistive Device Four wheel walker  Distance Ambulated (ft) 940 ft  Activity Response Tolerated well  Mobility Referral Yes  $Mobility charge 1 Mobility  Mobility Specialist Start Time (ACUTE ONLY) 1050  Mobility Specialist Stop Time (ACUTE ONLY) 1102  Mobility Specialist Time Calculation (min) (ACUTE ONLY) 12 min    Pre Mobility: 94 HR,  96% SpO2 During Mobility: 130 HR Post Mobility:  116 HR  Pt received in bed, agreeable to mobility. Asymptomatic throughout w/ no complaints. Pt left in bed with call bell and all needs met.  Stacey Montoya Mobility Specialist Please contact via Special educational needs teacher or Rehab office at [phone]

## 2022-10-16 NOTE — Progress Notes (Signed)
[facility]                                                                              Stacey Montoya, is a 47 y.o. female, DOB - [birth year], QIH:[id number] Admit date - 10/05/2022    Outpatient Primary MD for the patient is Stacey Aye, [name], MD  LOS - 11  days  Chief Complaint  Patient presents with   Shortness of Breath       Brief summary   Patient is a 47 year old female with HTN, HLP, multiple myeloma s/p chemotherapy, now in remission, DVT on Eliquis presented to ED with 3 days of worsening shortness of breath.  She was found to have right-sided upper lobe pulmonary consolidation, loculated pleural effusion, AKI.  She was placed on antibiotics, underwent right chest tube placement on 10/06/2022.  Nephrology, cardiothoracic surgery also consulted.  She had insertion of nontunneled CVC catheter placement by PCCM on 8/24, HD started on 10/10/2022.   Assessment & Plan    Principal Problem:   Acute respiratory failure with hypoxia (HCC) Community-acquired pneumonia, right-sided with loculated parapneumonic effusion -chest tube was placed and removed on 10/12/2022.  Patient received pleural fibrinolytics as per pulmonology -Completed doxycycline, currently on Rocephin -Continue bronchodilators, pain control, mobilize, outpatient PFTs -Stable, sats 96% on room air  Active Problems:   AKI (acute kidney injury) (HCC) on CKD stage IIIa, non-anion gap metabolic acidosis -Nephrology consulted, likely ATN due to Toradol and fluid shift with draining pleural effusion, hemodynamic changes. -Renal ultrasound unremarkable. -Right IJ temp HD catheter placed on 8/24 by PCCM, started on HD on 8/24 -Received second HD on 8/26. -Urine output stable and improving, CLIP process on hold -Creatinine at 7.8, will follow trend, likely plateau today, expecting renal recovery    Normocytic anemia, anemia of chronic disease -From renal failure, multiple myeloma, chronic  illness -Transfused 1 unit packed RBCs on 8/25, 8/27 -hb 7.9, follow, ESA per nephrology.  May need packed RBC transfusion if hemoglobin less than 7    Multiple myeloma (HCC) -Revlimid on hold until reevaluation by Dr. Candise Montoya as outpatient  Leukopenia, -Resolved  Thrombocytopenia (HCC) -Improving  Hyponatremia -Improving  Hypocalcemia -Albumin on 8/24 1.8 -Improving    Obesity, Class III, BMI 40-49.9 (morbid obesity) (HCC) Estimated body mass index is 45.41 kg/m as calculated from the following:   Height as of this encounter: 5' 3.25" (1.607 m).   Weight as of this encounter: 117.2 kg.  Code Status: Full code DVT Prophylaxis:  heparin injection 5,000 Units Start: 10/14/22 1430 SCDs Start: 10/05/22 1639   Level of Care: Level of care: Progressive Family Communication: Updated patient's mother at bedside Disposition Plan:      Remains inpatient appropriate:   Will likely need a few days until renal function recovery.  If patient does not need HD, will need HD catheter removal.   Procedures:  Chest tube placement and removal HD  Consultants:   Pulmonary critical care CT surgery Nephrology  Antimicrobials:   Anti-infectives (From admission, onward)    Start     Dose/Rate Route Frequency Ordered Stop   10/05/22 2200  acyclovir (ZOVIRAX) tablet 400 mg  400 mg Oral 2 times daily 10/05/22 1640     10/05/22 2000  cefTRIAXone (ROCEPHIN) 2 g in sodium chloride 0.9 % 100 mL IVPB        2 g 200 mL/hr over 30 Minutes Intravenous Daily-1800 10/05/22 1909     10/05/22 1800  doxycycline (VIBRAMYCIN) 100 mg in sodium chloride 0.9 % 250 mL IVPB        100 mg 125 mL/hr over 120 Minutes Intravenous Every 12 hours 10/05/22 1647 10/10/22 1759   10/05/22 1645  levofloxacin (LEVAQUIN) IVPB 500 mg  Status:  Discontinued        500 mg 100 mL/hr over 60 Minutes Intravenous Every 24 hours 10/05/22 1641 10/05/22 1641   10/05/22 1645  Levofloxacin (LEVAQUIN) IVPB 250 mg  Status:   Discontinued        250 mg 50 mL/hr over 60 Minutes Intravenous Every 24 hours 10/05/22 1641 10/05/22 1647          Medications  acyclovir  400 mg Oral BID   arformoterol  15 mcg Nebulization BID   budesonide (PULMICORT) nebulizer solution  0.5 mg Nebulization BID   calcium acetate  1,334 mg Oral BID WC   Chlorhexidine Gluconate Cloth  6 each Topical Daily   docusate sodium  100 mg Oral BID   gabapentin  100 mg Oral TID   heparin injection (subcutaneous)  5,000 Units Subcutaneous Q8H   medroxyPROGESTERone  10 mg Oral Daily   metoprolol tartrate  25 mg Oral BID   nicotine  21 mg Transdermal Q24H   pantoprazole  40 mg Oral Daily   polyethylene glycol  17 g Oral Daily   pravastatin  40 mg Oral Daily   revefenacin  175 mcg Nebulization Daily   sodium chloride flush  10 mL Intrapleural Q8H      Subjective:   Stacey Montoya was seen and examined today.  No acute complaints.  Hopeful for kidneys to recover.  No nausea vomiting, abdominal pain, fevers.    Objective:   Vitals:   10/16/22 0611 10/16/22 0758 10/16/22 0835 10/16/22 1229  BP: (!) 150/72 (!) 151/81  (!) 151/78  Pulse: 91 84 85   Resp: 13 20 14 14   Temp: 98.6 F (37 C) 98.8 F (37.1 C)  98.2 F (36.8 C)  TempSrc: Oral Oral  Oral  SpO2: 97% 98% 98%   Weight: 117.2 kg     Height:        Intake/Output Summary (Last 24 hours) at 10/16/2022 1330 Last data filed at 10/16/2022 0300 Gross per 24 hour  Intake 120 ml  Output 2800 ml  Net -2680 ml     Wt Readings from Last 3 Encounters:  10/16/22 117.2 kg  09/16/22 118.5 kg  07/14/22 117.1 kg   Physical Exam General: Alert and oriented x 3, NAD Cardiovascular: S1 S2 clear, RRR.  Respiratory: CTAB, no wheezing Gastrointestinal: Soft, nontender, nondistended, NBS Ext: no pedal edema bilaterally, compression socks + Neuro: no new deficits Skin: Right IJ HD cath Psych: Normal affect     Data Reviewed:  I have personally reviewed following labs     CBC Lab Results  Component Value Date   WBC 4.4 10/16/2022   RBC 2.53 (L) 10/16/2022   HGB 7.9 (L) 10/16/2022   HCT 23.9 (L) 10/16/2022   MCV 94.5 10/16/2022   MCH 31.2 10/16/2022   PLT 122 (L) 10/16/2022   MCHC 33.1 10/16/2022   RDW 20.2 (H) 10/16/2022   LYMPHSABS 0.5 (L)  10/14/2022   MONOABS 0.6 10/14/2022   EOSABS 0.2 10/14/2022   BASOSABS 0.0 10/14/2022     Last metabolic panel Lab Results  Component Value Date   NA 139 10/16/2022   K 4.3 10/16/2022   CL 107 10/16/2022   CO2 18 (L) 10/16/2022   BUN 104 (H) 10/16/2022   CREATININE 7.88 (H) 10/16/2022   GLUCOSE 105 (H) 10/16/2022   GFRNONAA 6 (L) 10/16/2022   CALCIUM 8.5 (L) 10/16/2022   PHOS 9.2 (H) 10/16/2022   PROT 4.9 (L) 10/10/2022   ALBUMIN 2.0 (L) 10/16/2022   LABGLOB 3.0 09/16/2022   AGRATIO 0.5 (L) 04/03/2020   BILITOT 1.0 10/10/2022   ALKPHOS 117 10/10/2022   AST 43 (H) 10/10/2022   ALT 61 (H) 10/10/2022   ANIONGAP 14 10/16/2022    CBG (last 3)  No results for input(s): "GLUCAP" in the last 72 hours.     Coagulation Profile: No results for input(s): "INR", "PROTIME" in the last 168 hours.   Radiology Studies: I have personally reviewed the imaging studies  No results found.     Thad Ranger M.D. [facility] 10/16/2022, 1:30 PM  Available via Epic secure chat 7am-7pm After 7 pm, please refer to night coverage provider listed on amion.

## 2022-10-17 ENCOUNTER — Other Ambulatory Visit: Payer: Self-pay | Admitting: Hematology

## 2022-10-17 DIAGNOSIS — N179 Acute kidney failure, unspecified: Secondary | ICD-10-CM | POA: Diagnosis not present

## 2022-10-17 DIAGNOSIS — C9001 Multiple myeloma in remission: Secondary | ICD-10-CM

## 2022-10-17 DIAGNOSIS — J9601 Acute respiratory failure with hypoxia: Secondary | ICD-10-CM | POA: Diagnosis not present

## 2022-10-17 DIAGNOSIS — J90 Pleural effusion, not elsewhere classified: Secondary | ICD-10-CM | POA: Diagnosis not present

## 2022-10-17 LAB — RENAL FUNCTION PANEL
Albumin: 2 g/dL — ABNORMAL LOW (ref 3.5–5.0)
Anion gap: 13 (ref 5–15)
BUN: 94 mg/dL — ABNORMAL HIGH (ref 6–20)
CO2: 18 mmol/L — ABNORMAL LOW (ref 22–32)
Calcium: 8.5 mg/dL — ABNORMAL LOW (ref 8.9–10.3)
Chloride: 109 mmol/L (ref 98–111)
Creatinine, Ser: 7.33 mg/dL — ABNORMAL HIGH (ref 0.44–1.00)
GFR, Estimated: 6 mL/min — ABNORMAL LOW (ref >60)
Glucose, Bld: 124 mg/dL — ABNORMAL HIGH (ref 70–99)
Phosphorus: 7.8 mg/dL — ABNORMAL HIGH (ref 2.5–4.6)
Potassium: 3.9 mmol/L (ref 3.5–5.1)
Sodium: 140 mmol/L (ref 135–145)

## 2022-10-17 NOTE — Progress Notes (Signed)
[facility] NEPHROLOGY PROGRESS NOTE  Assessment/ Plan: Pt is a 47 y.o. yo female  with AKI after presenting with loculated parapneumonic effusion, hx/o autologous SCT for MM   # Acute kidney injury on CKD 3. This is likely ATN due to Toradol, + fluid shift from draining pleural effusion/hemodynamic changes.  UA, US renal unremarkable.  Hep B, hep C, HIV, kappa lambda ratio unremarkable  RIJ temp HD catheter placed on 8/24 by CCM and received first HD and second HD on 8/26. The urine output  significantly improved , the creatinine level was trending up-  but down for the first time today.   without any sign or symptoms of uremia.  continue to hold further dialysis and watch for renal recovery.   keep HD catheter in place. I have discussed with the patient   # Loculated parapneumonic effusion status post chest tube and pleurodesis per pulmonary team.  # Hx/o MM and autologous SCT at [facility] 2022; neg rpt studies here  # Hyponatremia, hypervolemic: resolved  # Hypokalemia: Improved.  # Anemia: Iron saturation 10%.  ordered IV iron.  Received blood transfusion as well. Monitor CBC.  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  Also having auto diuresis.  Take her off of fluid restriction  # Hyperphosphatemia: increasing calcium acetate to 2 pills with each meal and 1 with snacks. Still appropriate to keep on board  Subjective: Seen and examined at the bedside.  Urine output is 4500-  BUN and crt down !  Is thirsty   Objective Vital signs in last 24 hours: Vitals:   10/16/22 2307 10/17/22 0344 10/17/22 0753 10/17/22 0916  BP: (!) 155/72 (!) 155/76 (!) 147/83 (!) 165/85  Pulse: 98 79 92 94  Resp: 16 20 18    Temp: 98.5 F (36.9 C) 98.5 F (36.9 C) 98.8 F (37.1 C)   TempSrc: Oral Oral Oral   SpO2: 97% 99% 100%   Weight:  116.4 kg    Height:       Weight change: -0.771 kg  Intake/Output Summary (Last 24 hours) at  10/17/2022 0944 Last data filed at 10/17/2022 0700 Gross per 24 hour  Intake 240 ml  Output 4500 ml  Net -4260 ml       Labs: RENAL PANEL Recent Labs  Lab 10/13/22 0548 10/14/22 0418 10/15/22 0425 10/16/22 0510 10/17/22 0309  NA 131* 133* 135 139 140  K 3.3* 4.1 4.4 4.3 3.9  CL 94* 98 102 107 109  CO2 20* 18* 18* 18* 18*  GLUCOSE 94 96 96 105* 124*  BUN 67* 82* 95* 104* 94*  CREATININE 6.55* 7.29* 7.85* 7.88* 7.33*  CALCIUM 7.2* 7.8* 8.0* 8.5* 8.5*  PHOS 7.5* 9.1* 9.8* 9.2* 7.8*  ALBUMIN  --  1.9* 1.9* 2.0* 2.0*    Liver Function Tests: Recent Labs  Lab 10/15/22 0425 10/16/22 0510 10/17/22 0309  ALBUMIN 1.9* 2.0* 2.0*   No results for input(s): "LIPASE", "AMYLASE" in the last 168 hours. No results for input(s): "AMMONIA" in the last 168 hours. CBC: Recent Labs    10/12/22 0623 10/13/22 0548 10/13/22 1518 10/14/22 0418 10/16/22 0510  HGB 7.2* 6.8* 8.0* 8.7* 7.9*  MCV 91.6 92.9  --  92.7 94.5  FERRITIN  --  708*  --   --   --   TIBC  --  199*  --   --   --   IRON  --  20*  --   --   --  Cardiac Enzymes: No results for input(s): "CKTOTAL", "CKMB", "CKMBINDEX", "TROPONINI" in the last 168 hours.  CBG: Recent Labs  Lab 10/11/22 2007 10/12/22 0557  GLUCAP 104* 85    Iron Studies:  No results for input(s): "IRON", "TIBC", "TRANSFERRIN", "FERRITIN" in the last 72 hours.  Studies/Results: No results found.  Medications: Infusions:  cefTRIAXone (ROCEPHIN)  IV 2 g (10/16/22 1641)    Scheduled Medications:  acyclovir  400 mg Oral BID   arformoterol  15 mcg Nebulization BID   budesonide (PULMICORT) nebulizer solution  0.5 mg Nebulization BID   calcium acetate  1,334 mg Oral BID WC   Chlorhexidine Gluconate Cloth  6 each Topical Daily   docusate sodium  100 mg Oral BID   gabapentin  100 mg Oral TID   heparin injection (subcutaneous)  5,000 Units Subcutaneous Q8H   medroxyPROGESTERone  10 mg Oral Daily   metoprolol tartrate  25 mg Oral BID    nicotine  21 mg Transdermal Q24H   pantoprazole  40 mg Oral Daily   polyethylene glycol  17 g Oral Daily   pravastatin  40 mg Oral Daily   revefenacin  175 mcg Nebulization Daily    have reviewed scheduled and prn medications.  Physical Exam: General:NAD, comfortable Heart:RRR, s1s2 nl Lungs: Clear bilateral. Abdomen:soft, Non-tender, non-distended Extremities:min edema Dialysis Access: Right IJ temporary HD catheter in place for 7 days , site clean.  Denaja Verhoeven A Emmajean Ratledge 10/17/2022,9:44 AM  LOS: 12 days

## 2022-10-17 NOTE — Plan of Care (Signed)

## 2022-10-17 NOTE — Progress Notes (Signed)
[facility]                                                                              Stacey Montoya, is a 47 y.o. female, DOB - [birth year], KGM:[id number] Admit date - 10/05/2022    Outpatient Primary MD for the patient is [name], [name], MD  LOS - 12  days  Chief Complaint  Patient presents with   Shortness of Breath       Brief summary   Patient is a 47 year old female with HTN, HLP, multiple myeloma s/p chemotherapy, now in remission, DVT on Eliquis presented to ED with 3 days of worsening shortness of breath.  She was found to have right-sided upper lobe pulmonary consolidation, loculated pleural effusion, AKI.  She was placed on antibiotics, underwent right chest tube placement on 10/06/2022.  Nephrology, cardiothoracic surgery also consulted.  She had insertion of nontunneled CVC catheter placement by PCCM on 8/24, HD started on 10/10/2022.   Assessment & Plan    Principal Problem:   Acute respiratory failure with hypoxia (HCC) Community-acquired pneumonia, right-sided with loculated parapneumonic effusion -chest tube was placed and removed on 10/12/2022.  Patient received pleural fibrinolytics as per pulmonology -Completed doxycycline, currently on Rocephin -Continue bronchodilators, pain control, mobilize, outpatient PFTs -No acute complaints, O2 sats 96% to 98% on room air  Active Problems:   AKI (acute kidney injury) (HCC) on CKD stage IIIa, non-anion gap metabolic acidosis -Nephrology consulted, likely ATN due to Toradol and fluid shift with draining pleural effusion, hemodynamic changes. -Renal ultrasound unremarkable. -Right IJ temp HD catheter placed on 8/24 by PCCM, started on HD on 8/24 -Received second HD on 8/26. -Urine output stable and improving, CLIP process on hold -Creatinine plateaued at 7.8, now improving, continue to wait and watch for renal recovery -Creatinine 7.3 today    Normocytic anemia, anemia of chronic disease -From renal  failure, multiple myeloma, chronic illness -Transfused 1 unit packed RBCs on 8/25, 8/27 -ESA per nephrology.  May need packed RBC transfusion if hemoglobin less than 7 -Follow CBC in a.m.    Multiple myeloma (HCC) -Revlimid on hold until reevaluation by Dr. Candise Che as outpatient  Leukopenia, -Resolved  Thrombocytopenia (HCC) -Improving  Hyponatremia -Improving  Hypocalcemia -Albumin on 8/24 1.8 -Improving    Obesity, Class III, BMI 40-49.9 (morbid obesity) (HCC) Estimated body mass index is 45.11 kg/m as calculated from the following:   Height as of this encounter: 5' 3.25" (1.607 m).   Weight as of this encounter: 116.4 kg.  Code Status: Full code DVT Prophylaxis:  heparin injection 5,000 Units Start: 10/14/22 1430 SCDs Start: 10/05/22 1639   Level of Care: Level of care: Progressive Family Communication: Updated patient's mother at bedside Disposition Plan:      Remains inpatient appropriate:   Will likely need a few days until renal function recovery.  If patient does not need HD, will need HD catheter removal.   Procedures:  Chest tube placement and removal HD  Consultants:   Pulmonary critical care CT surgery Nephrology  Antimicrobials:   Anti-infectives (From admission, onward)    Start     Dose/Rate Route Frequency Ordered  Stop   10/05/22 2200  acyclovir (ZOVIRAX) tablet 400 mg        400 mg Oral 2 times daily 10/05/22 1640     10/05/22 2000  cefTRIAXone (ROCEPHIN) 2 g in sodium chloride 0.9 % 100 mL IVPB        2 g 200 mL/hr over 30 Minutes Intravenous Daily-1800 10/05/22 1909     10/05/22 1800  doxycycline (VIBRAMYCIN) 100 mg in sodium chloride 0.9 % 250 mL IVPB        100 mg 125 mL/hr over 120 Minutes Intravenous Every 12 hours 10/05/22 1647 10/10/22 1759   10/05/22 1645  levofloxacin (LEVAQUIN) IVPB 500 mg  Status:  Discontinued        500 mg 100 mL/hr over 60 Minutes Intravenous Every 24 hours 10/05/22 1641 10/05/22 1641   10/05/22 1645   Levofloxacin (LEVAQUIN) IVPB 250 mg  Status:  Discontinued        250 mg 50 mL/hr over 60 Minutes Intravenous Every 24 hours 10/05/22 1641 10/05/22 1647          Medications  acyclovir  400 mg Oral BID   arformoterol  15 mcg Nebulization BID   budesonide (PULMICORT) nebulizer solution  0.5 mg Nebulization BID   calcium acetate  1,334 mg Oral BID WC   Chlorhexidine Gluconate Cloth  6 each Topical Daily   docusate sodium  100 mg Oral BID   gabapentin  100 mg Oral TID   heparin injection (subcutaneous)  5,000 Units Subcutaneous Q8H   medroxyPROGESTERone  10 mg Oral Daily   metoprolol tartrate  25 mg Oral BID   nicotine  21 mg Transdermal Q24H   pantoprazole  40 mg Oral Daily   polyethylene glycol  17 g Oral Daily   pravastatin  40 mg Oral Daily   revefenacin  175 mcg Nebulization Daily      Subjective:   Stacey Montoya was seen and examined today.  Happy today that creatinine is improving, mother at the bedside.  No acute complaints.  No abdominal pain nausea vomiting or any fevers. UOP continues to improve.  Objective:   Vitals:   10/17/22 0344 10/17/22 0753 10/17/22 0916 10/17/22 1205  BP: (!) 155/76 (!) 147/83 (!) 165/85 (!) 163/80  Pulse: 79 92 94 84  Resp: 20 18  18   Temp: 98.5 F (36.9 C) 98.8 F (37.1 C)  98.6 F (37 C)  TempSrc: Oral Oral  Oral  SpO2: 99% 100%  98%  Weight: 116.4 kg     Height:        Intake/Output Summary (Last 24 hours) at 10/17/2022 1315 Last data filed at 10/17/2022 1159 Gross per 24 hour  Intake 480 ml  Output 5200 ml  Net -4720 ml     Wt Readings from Last 3 Encounters:  10/17/22 116.4 kg  09/16/22 118.5 kg  07/14/22 117.1 kg    Physical Exam General: Alert and oriented x 3, NAD Cardiovascular: S1 S2 clear, RRR.  Respiratory: CTAB, no wheezing Gastrointestinal: Soft, nontender, nondistended, NBS Ext: no pedal edema bilaterally Neuro: no new deficits Skin: Right IJ HD cath    Data Reviewed:  I have personally  reviewed following labs    CBC Lab Results  Component Value Date   WBC 4.4 10/16/2022   RBC 2.53 (L) 10/16/2022   HGB 7.9 (L) 10/16/2022   HCT 23.9 (L) 10/16/2022   MCV 94.5 10/16/2022   MCH 31.2 10/16/2022   PLT 122 (L) 10/16/2022   MCHC 33.1  10/16/2022   RDW 20.2 (H) 10/16/2022   LYMPHSABS 0.5 (L) 10/14/2022   MONOABS 0.6 10/14/2022   EOSABS 0.2 10/14/2022   BASOSABS 0.0 10/14/2022     Last metabolic panel Lab Results  Component Value Date   NA 140 10/17/2022   K 3.9 10/17/2022   CL 109 10/17/2022   CO2 18 (L) 10/17/2022   BUN 94 (H) 10/17/2022   CREATININE 7.33 (H) 10/17/2022   GLUCOSE 124 (H) 10/17/2022   GFRNONAA 6 (L) 10/17/2022   CALCIUM 8.5 (L) 10/17/2022   PHOS 7.8 (H) 10/17/2022   PROT 4.9 (L) 10/10/2022   ALBUMIN 2.0 (L) 10/17/2022   LABGLOB 3.0 09/16/2022   AGRATIO 0.5 (L) 04/03/2020   BILITOT 1.0 10/10/2022   ALKPHOS 117 10/10/2022   AST 43 (H) 10/10/2022   ALT 61 (H) 10/10/2022   ANIONGAP 13 10/17/2022    CBG (last 3)  No results for input(s): "GLUCAP" in the last 72 hours.     Coagulation Profile: No results for input(s): "INR", "PROTIME" in the last 168 hours.   Radiology Studies: I have personally reviewed the imaging studies  No results found.     Thad Ranger M.D. [facility] 10/17/2022, 1:15 PM  Available via Epic secure chat 7am-7pm After 7 pm, please refer to night coverage provider listed on amion.

## 2022-10-18 DIAGNOSIS — J90 Pleural effusion, not elsewhere classified: Secondary | ICD-10-CM | POA: Diagnosis not present

## 2022-10-18 DIAGNOSIS — N179 Acute kidney failure, unspecified: Secondary | ICD-10-CM | POA: Diagnosis not present

## 2022-10-18 DIAGNOSIS — J9601 Acute respiratory failure with hypoxia: Secondary | ICD-10-CM | POA: Diagnosis not present

## 2022-10-18 LAB — RENAL FUNCTION PANEL
Albumin: 2.2 g/dL — ABNORMAL LOW (ref 3.5–5.0)
Anion gap: 14 (ref 5–15)
BUN: 78 mg/dL — ABNORMAL HIGH (ref 6–20)
CO2: 18 mmol/L — ABNORMAL LOW (ref 22–32)
Calcium: 8.6 mg/dL — ABNORMAL LOW (ref 8.9–10.3)
Chloride: 109 mmol/L (ref 98–111)
Creatinine, Ser: 6.5 mg/dL — ABNORMAL HIGH (ref 0.44–1.00)
GFR, Estimated: 7 mL/min — ABNORMAL LOW (ref >60)
Glucose, Bld: 103 mg/dL — ABNORMAL HIGH (ref 70–99)
Phosphorus: 6.8 mg/dL — ABNORMAL HIGH (ref 2.5–4.6)
Potassium: 4.1 mmol/L (ref 3.5–5.1)
Sodium: 141 mmol/L (ref 135–145)

## 2022-10-18 LAB — CBC
HCT: 24.3 % — ABNORMAL LOW (ref 36.0–46.0)
Hemoglobin: 7.7 g/dL — ABNORMAL LOW (ref 12.0–15.0)
MCH: 31.3 pg (ref 26.0–34.0)
MCHC: 31.7 g/dL (ref 30.0–36.0)
MCV: 98.8 fL (ref 80.0–100.0)
Platelets: 140 10*3/uL — ABNORMAL LOW (ref 150–400)
RBC: 2.46 MIL/uL — ABNORMAL LOW (ref 3.87–5.11)
RDW: 19.9 % — ABNORMAL HIGH (ref 11.5–15.5)
WBC: 5.2 10*3/uL (ref 4.0–10.5)
nRBC: 0 % (ref 0.0–0.2)

## 2022-10-18 MED ORDER — AMOXICILLIN-POT CLAVULANATE 875-125 MG PO TABS
1.0000 | ORAL_TABLET | Freq: Two times a day (BID) | ORAL | Status: DC
  Administered 2022-10-18 – 2022-10-19 (×2): 1 via ORAL
  Filled 2022-10-18 (×2): qty 1

## 2022-10-18 NOTE — Progress Notes (Signed)
[facility] NEPHROLOGY PROGRESS NOTE  Assessment/ Plan: Pt is a 47 y.o. yo female  with AKI after presenting with loculated parapneumonic effusion, hx/o autologous SCT for MM   # Acute kidney injury on CKD 3. This is likely ATN due to Toradol, + fluid shift from draining pleural effusion/hemodynamic changes.  UA, US renal unremarkable.  Hep B, hep C, HIV, kappa lambda ratio unremarkable  RIJ temp HD catheter placed on 8/24 by CCM and received first HD and second HD on 8/26. The urine output  significantly improved , the creatinine level was trending up-  but down for the second time today.   without any sign or symptoms of uremia.  continue to hold further dialysis -  anticipate further recovery-  if numbers better again tomorrow would feel comfortable with removal of line and discharge with follow up-  she would like to be seen in the [place] [facility] office  # Loculated parapneumonic effusion status post chest tube and pleurodesis per pulmonary team.  # Hx/o MM and autologous SCT at [facility] 2022; neg rpt studies here  # Hyponatremia, hypervolemic: resolved  # Hypokalemia: Improved.  # Anemia: Iron saturation 10%.  ordered IV iron.  Received blood transfusion as well. Monitor CBC.  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  Also having auto diuresis.  Taken her off of fluid restriction  # Hyperphosphatemia: increasing calcium acetate to 2 pills with each meal and 1 with snacks. Still appropriate to keep on board it seems   Subjective: Seen and examined at the bedside.  Urine output is 3900-  BUN and crt down again !     Objective Vital signs in last 24 hours: Vitals:   10/17/22 1952 10/17/22 2343 10/18/22 0305 10/18/22 0740  BP:  135/71 138/74 (!) 144/75  Pulse:  87 86 80  Resp:  15 14 15   Temp:  98.6 F (37 C) 99.1 F (37.3 C) 98.7 F (37.1 C)  TempSrc:  Oral Oral Oral  SpO2: 97% 95% 95% 97%  Weight:   115.6 kg    Height:       Weight change: -0.816 kg  Intake/Output Summary (Last 24 hours) at 10/18/2022 0901 Last data filed at 10/18/2022 0700 Gross per 24 hour  Intake 840 ml  Output 3900 ml  Net -3060 ml       Labs: RENAL PANEL Recent Labs  Lab 10/14/22 0418 10/15/22 0425 10/16/22 0510 10/17/22 0309 10/18/22 0302  NA 133* 135 139 140 141  K 4.1 4.4 4.3 3.9 4.1  CL 98 102 107 109 109  CO2 18* 18* 18* 18* 18*  GLUCOSE 96 96 105* 124* 103*  BUN 82* 95* 104* 94* 78*  CREATININE 7.29* 7.85* 7.88* 7.33* 6.50*  CALCIUM 7.8* 8.0* 8.5* 8.5* 8.6*  PHOS 9.1* 9.8* 9.2* 7.8* 6.8*  ALBUMIN 1.9* 1.9* 2.0* 2.0* 2.2*    Liver Function Tests: Recent Labs  Lab 10/16/22 0510 10/17/22 0309 10/18/22 0302  ALBUMIN 2.0* 2.0* 2.2*   No results for input(s): "LIPASE", "AMYLASE" in the last 168 hours. No results for input(s): "AMMONIA" in the last 168 hours. CBC: Recent Labs    10/13/22 0548 10/13/22 1518 10/14/22 0418 10/16/22 0510 10/18/22 0302  HGB 6.8* 8.0* 8.7* 7.9* 7.7*  MCV 92.9  --  92.7 94.5 98.8  FERRITIN 708*  --   --   --   --   TIBC 199*  --   --   --   --  IRON 20*  --   --   --   --     Cardiac Enzymes: No results for input(s): "CKTOTAL", "CKMB", "CKMBINDEX", "TROPONINI" in the last 168 hours.  CBG: Recent Labs  Lab 10/11/22 2007 10/12/22 0557  GLUCAP 104* 85    Iron Studies:  No results for input(s): "IRON", "TIBC", "TRANSFERRIN", "FERRITIN" in the last 72 hours.  Studies/Results: No results found.  Medications: Infusions:  cefTRIAXone (ROCEPHIN)  IV 2 g (10/17/22 1743)    Scheduled Medications:  acyclovir  400 mg Oral BID   arformoterol  15 mcg Nebulization BID   budesonide (PULMICORT) nebulizer solution  0.5 mg Nebulization BID   calcium acetate  1,334 mg Oral BID WC   Chlorhexidine Gluconate Cloth  6 each Topical Daily   docusate sodium  100 mg Oral BID   gabapentin  100 mg Oral TID   heparin injection (subcutaneous)  5,000 Units Subcutaneous  Q8H   medroxyPROGESTERone  10 mg Oral Daily   metoprolol tartrate  25 mg Oral BID   nicotine  21 mg Transdermal Q24H   pantoprazole  40 mg Oral Daily   polyethylene glycol  17 g Oral Daily   pravastatin  40 mg Oral Daily   revefenacin  175 mcg Nebulization Daily    have reviewed scheduled and prn medications.  Physical Exam: General:NAD, comfortable Heart:RRR, s1s2 nl Lungs: Clear bilateral. Abdomen:soft, Non-tender, non-distended Extremities:min edema Dialysis Access: Right IJ temporary HD catheter in place for 8 days , site clean.  Jamol Ginyard A Sya Nestler 10/18/2022,9:01 AM  LOS: 13 days

## 2022-10-18 NOTE — Progress Notes (Signed)
[facility]                                                                              Stacey Montoya, is a 47 y.o. female, DOB - 05-09-1975, ZOX:[id number] Admit date - 10/05/2022    Outpatient Primary MD for the patient is [name], [name], MD  LOS - 13  days  Chief Complaint  Patient presents with   Shortness of Breath       Brief summary   Patient is a 47 year old female with HTN, HLP, multiple myeloma s/p chemotherapy, now in remission, DVT on Eliquis presented to ED with 3 days of worsening shortness of breath.  She was found to have right-sided upper lobe pulmonary consolidation, loculated pleural effusion, AKI.  She was placed on antibiotics, underwent right chest tube placement on 10/06/2022.  Nephrology, cardiothoracic surgery also consulted.  She had insertion of nontunneled CVC catheter placement by PCCM on 8/24, HD started on 10/10/2022.   Assessment & Plan    Principal Problem:   Acute respiratory failure with hypoxia (HCC) Community-acquired pneumonia, right-sided with loculated parapneumonic effusion -chest tube was placed and removed on 10/12/2022.  Patient received pleural fibrinolytics as per pulmonology -Completed doxycycline, currently on Rocephin-> transition to oral antibiotics -Continue bronchodilators, pain control, mobilize, outpatient PFTs -No complaints, sats 96% on room air  Active Problems:   AKI (acute kidney injury) (HCC) on CKD stage IIIa, non-anion gap metabolic acidosis -Nephrology consulted, likely ATN due to Toradol and fluid shift with draining pleural effusion, hemodynamic changes. -Renal ultrasound unremarkable. -Right IJ temp HD catheter placed on 8/24 by PCCM, started on HD on 8/24 -Received second HD on 8/26. -Urine output stable and improving, CLIP process on hold -Creatinine plateaued at 7.88 -Creatinine 7.2-> 7.85-> 7.88-> 7.3-> 6.5 -If continues to improve, plan for removal of line and discharge tomorrow      Normocytic anemia, anemia of chronic disease -From renal failure, multiple myeloma, chronic illness -Transfused 1 unit packed RBCs on 8/25, 8/27 -ESA per nephrology.   -Hemoglobin 7.7, continue to follow    Multiple myeloma (HCC) -Revlimid on hold until reevaluation by Dr. Candise Che as outpatient  Leukopenia, -Resolved  Thrombocytopenia (HCC) -Improving, 140K  Hyponatremia -Improving  Hypocalcemia -Albumin on 8/24 1.8 -Improving    Obesity, Class III, BMI 40-49.9 (morbid obesity) (HCC) Estimated body mass index is 44.8 kg/m as calculated from the following:   Height as of this encounter: 5' 3.25" (1.607 m).   Weight as of this encounter: 115.6 kg.  Code Status: Full code DVT Prophylaxis:  heparin injection 5,000 Units Start: 10/14/22 1430 SCDs Start: 10/05/22 1639   Level of Care: Level of care: Progressive Family Communication: Updated patient's mother at bedside Disposition Plan:      Remains inpatient appropriate:   Possible DC home in a.m., if creatinine improving  Procedures:  Chest tube placement and removal HD  Consultants:   Pulmonary critical care CT surgery Nephrology  Antimicrobials:   Anti-infectives (From admission, onward)    Start     Dose/Rate Route Frequency Ordered Stop   10/18/22 1800  amoxicillin-clavulanate (AUGMENTIN) 875-125 MG per tablet 1 tablet  1 tablet Oral Every 12 hours 10/18/22 1221     10/05/22 2200  acyclovir (ZOVIRAX) tablet 400 mg        400 mg Oral 2 times daily 10/05/22 1640     10/05/22 2000  cefTRIAXone (ROCEPHIN) 2 g in sodium chloride 0.9 % 100 mL IVPB  Status:  Discontinued        2 g 200 mL/hr over 30 Minutes Intravenous Daily-1800 10/05/22 1909 10/18/22 1221   10/05/22 1800  doxycycline (VIBRAMYCIN) 100 mg in sodium chloride 0.9 % 250 mL IVPB        100 mg 125 mL/hr over 120 Minutes Intravenous Every 12 hours 10/05/22 1647 10/10/22 1759   10/05/22 1645  levofloxacin (LEVAQUIN) IVPB 500 mg  Status:  Discontinued         500 mg 100 mL/hr over 60 Minutes Intravenous Every 24 hours 10/05/22 1641 10/05/22 1641   10/05/22 1645  Levofloxacin (LEVAQUIN) IVPB 250 mg  Status:  Discontinued        250 mg 50 mL/hr over 60 Minutes Intravenous Every 24 hours 10/05/22 1641 10/05/22 1647          Medications  acyclovir  400 mg Oral BID   amoxicillin-clavulanate  1 tablet Oral Q12H   arformoterol  15 mcg Nebulization BID   budesonide (PULMICORT) nebulizer solution  0.5 mg Nebulization BID   calcium acetate  1,334 mg Oral BID WC   Chlorhexidine Gluconate Cloth  6 each Topical Daily   docusate sodium  100 mg Oral BID   gabapentin  100 mg Oral TID   heparin injection (subcutaneous)  5,000 Units Subcutaneous Q8H   medroxyPROGESTERone  10 mg Oral Daily   metoprolol tartrate  25 mg Oral BID   nicotine  21 mg Transdermal Q24H   pantoprazole  40 mg Oral Daily   polyethylene glycol  17 g Oral Daily   pravastatin  40 mg Oral Daily   revefenacin  175 mcg Nebulization Daily      Subjective:   Stacey Montoya was seen and examined today.  No acute complaints, looking forward to discharge tomorrow.  Mother at the bedside.  No nausea vomiting abdominal pain or any fevers.  Objective:   Vitals:   10/18/22 0305 10/18/22 0740 10/18/22 0901 10/18/22 1218  BP: 138/74 (!) 144/75 121/66 (!) 157/78  Pulse: 86 80 81 80  Resp: 14 15 14 18   Temp: 99.1 F (37.3 C) 98.7 F (37.1 C) 98.1 F (36.7 C) 98.5 F (36.9 C)  TempSrc: Oral Oral Oral Oral  SpO2: 95% 97% 96%   Weight: 115.6 kg     Height:        Intake/Output Summary (Last 24 hours) at 10/18/2022 1345 Last data filed at 10/18/2022 0700 Gross per 24 hour  Intake 600 ml  Output 3200 ml  Net -2600 ml     Wt Readings from Last 3 Encounters:  10/18/22 115.6 kg  09/16/22 118.5 kg  07/14/22 117.1 kg   Physical Exam General: Alert and oriented x 3, NAD Cardiovascular: S1 S2 clear, RRR.  Respiratory: CTAB, no wheezing Gastrointestinal: Soft, nontender,  nondistended, NBS Ext: no pedal edema bilaterally Neuro: no new deficits Skin: Rt IJ HD cath Psych: Normal affect     Data Reviewed:  I have personally reviewed following labs    CBC Lab Results  Component Value Date   WBC 5.2 10/18/2022   RBC 2.46 (L) 10/18/2022   HGB 7.7 (L) 10/18/2022   HCT 24.3 (L) 10/18/2022  MCV 98.8 10/18/2022   MCH 31.3 10/18/2022   PLT 140 (L) 10/18/2022   MCHC 31.7 10/18/2022   RDW 19.9 (H) 10/18/2022   LYMPHSABS 0.5 (L) 10/14/2022   MONOABS 0.6 10/14/2022   EOSABS 0.2 10/14/2022   BASOSABS 0.0 10/14/2022     Last metabolic panel Lab Results  Component Value Date   NA 141 10/18/2022   K 4.1 10/18/2022   CL 109 10/18/2022   CO2 18 (L) 10/18/2022   BUN 78 (H) 10/18/2022   CREATININE 6.50 (H) 10/18/2022   GLUCOSE 103 (H) 10/18/2022   GFRNONAA 7 (L) 10/18/2022   CALCIUM 8.6 (L) 10/18/2022   PHOS 6.8 (H) 10/18/2022   PROT 4.9 (L) 10/10/2022   ALBUMIN 2.2 (L) 10/18/2022   LABGLOB 3.0 09/16/2022   AGRATIO 0.5 (L) 04/03/2020   BILITOT 1.0 10/10/2022   ALKPHOS 117 10/10/2022   AST 43 (H) 10/10/2022   ALT 61 (H) 10/10/2022   ANIONGAP 14 10/18/2022    CBG (last 3)  No results for input(s): "GLUCAP" in the last 72 hours.     Coagulation Profile: No results for input(s): "INR", "PROTIME" in the last 168 hours.   Radiology Studies: I have personally reviewed the imaging studies  No results found.     Thad Ranger M.D. [facility] 10/18/2022, 1:45 PM  Available via Epic secure chat 7am-7pm After 7 pm, please refer to night coverage provider listed on amion.

## 2022-10-19 DIAGNOSIS — J90 Pleural effusion, not elsewhere classified: Secondary | ICD-10-CM | POA: Diagnosis not present

## 2022-10-19 DIAGNOSIS — J9601 Acute respiratory failure with hypoxia: Secondary | ICD-10-CM | POA: Diagnosis not present

## 2022-10-19 DIAGNOSIS — N179 Acute kidney failure, unspecified: Secondary | ICD-10-CM | POA: Diagnosis not present

## 2022-10-19 LAB — CBC
HCT: 25.2 % — ABNORMAL LOW (ref 36.0–46.0)
Hemoglobin: 7.9 g/dL — ABNORMAL LOW (ref 12.0–15.0)
MCH: 31.7 pg (ref 26.0–34.0)
MCHC: 31.3 g/dL (ref 30.0–36.0)
MCV: 101.2 fL — ABNORMAL HIGH (ref 80.0–100.0)
Platelets: 151 10*3/uL (ref 150–400)
RBC: 2.49 MIL/uL — ABNORMAL LOW (ref 3.87–5.11)
RDW: 19.8 % — ABNORMAL HIGH (ref 11.5–15.5)
WBC: 5.4 10*3/uL (ref 4.0–10.5)
nRBC: 0 % (ref 0.0–0.2)

## 2022-10-19 LAB — RENAL FUNCTION PANEL
Albumin: 2.2 g/dL — ABNORMAL LOW (ref 3.5–5.0)
Anion gap: 9 (ref 5–15)
BUN: 69 mg/dL — ABNORMAL HIGH (ref 6–20)
CO2: 18 mmol/L — ABNORMAL LOW (ref 22–32)
Calcium: 8.5 mg/dL — ABNORMAL LOW (ref 8.9–10.3)
Chloride: 110 mmol/L (ref 98–111)
Creatinine, Ser: 5.9 mg/dL — ABNORMAL HIGH (ref 0.44–1.00)
GFR, Estimated: 8 mL/min — ABNORMAL LOW (ref >60)
Glucose, Bld: 106 mg/dL — ABNORMAL HIGH (ref 70–99)
Phosphorus: 6.5 mg/dL — ABNORMAL HIGH (ref 2.5–4.6)
Potassium: 3.8 mmol/L (ref 3.5–5.1)
Sodium: 137 mmol/L (ref 135–145)

## 2022-10-19 MED ORDER — ONDANSETRON HCL 4 MG PO TABS: 8.0000 mg | ORAL_TABLET | Freq: Three times a day (TID) | ORAL | 0 refills | Status: AC | PRN

## 2022-10-19 MED ORDER — METOPROLOL TARTRATE 50 MG PO TABS: 50.0000 mg | ORAL_TABLET | Freq: Two times a day (BID) | ORAL | 3 refills | Status: DC

## 2022-10-19 MED ORDER — AMOXICILLIN-POT CLAVULANATE 875-125 MG PO TABS: 1.0000 | ORAL_TABLET | Freq: Two times a day (BID) | ORAL | 0 refills | Status: AC

## 2022-10-19 MED ORDER — METOPROLOL TARTRATE 50 MG PO TABS: 50.0000 mg | ORAL_TABLET | Freq: Two times a day (BID) | ORAL | Status: DC

## 2022-10-19 MED ORDER — ONDANSETRON HCL 4 MG PO TABS: 8.0000 mg | ORAL_TABLET | Freq: Three times a day (TID) | ORAL | 0 refills | Status: DC | PRN

## 2022-10-19 MED ORDER — GABAPENTIN 100 MG PO CAPS: 100.0000 mg | ORAL_CAPSULE | Freq: Three times a day (TID) | ORAL | 2 refills | Status: DC

## 2022-10-19 MED ORDER — FLUTICASONE-SALMETEROL 500-50 MCG/ACT IN AEPB: 1.0000 | INHALATION_SPRAY | Freq: Two times a day (BID) | RESPIRATORY_TRACT | 3 refills | Status: DC

## 2022-10-19 MED ORDER — DOCUSATE SODIUM 100 MG PO CAPS: 100.0000 mg | ORAL_CAPSULE | Freq: Two times a day (BID) | ORAL | 0 refills | Status: DC

## 2022-10-19 MED ORDER — DOCUSATE SODIUM 100 MG PO CAPS: 100.0000 mg | ORAL_CAPSULE | Freq: Two times a day (BID) | ORAL | 0 refills | Status: AC

## 2022-10-19 MED ORDER — WIXELA INHUB 500-50 MCG/ACT IN AEPB: 1.0000 | INHALATION_SPRAY | Freq: Two times a day (BID) | RESPIRATORY_TRACT | 3 refills | Status: DC

## 2022-10-19 MED ORDER — AMOXICILLIN-POT CLAVULANATE 875-125 MG PO TABS: 1.0000 | ORAL_TABLET | Freq: Two times a day (BID) | ORAL | 0 refills | Status: DC

## 2022-10-19 MED ORDER — AMLODIPINE BESYLATE 5 MG PO TABS: 5.0000 mg | ORAL_TABLET | Freq: Every day | ORAL | Status: DC

## 2022-10-19 MED ORDER — FLUTICASONE FUROATE-VILANTEROL 200-25 MCG/ACT IN AEPB: 1.0000 | INHALATION_SPRAY | Freq: Every day | RESPIRATORY_TRACT | Status: DC

## 2022-10-19 MED ORDER — ERGOCALCIFEROL 1.25 MG (50000 UT) PO CAPS: 50000.0000 [IU] | ORAL_CAPSULE | ORAL | Status: AC

## 2022-10-19 NOTE — Discharge Summary (Signed)
Physician Discharge Summary   Patient: Stacey Montoya MRN: [id number] DOB: 1975/03/22  Admit date:     10/05/2022  Discharge date: 10/19/22  Discharge Physician: Thad Ranger, MD    PCP: Benita Stabile, MD   Recommendations at discharge:   Continue to hold Lasix, outpatient CBC and bmet/renal panel in 1 week Patient will continue to hold Eliquis, discussed with her oncologist, Dr Candise Che and nephrology when to resume  Discharge Diagnoses:    Acute respiratory failure with hypoxia (HCC) Community-acquired pneumonia Right-sided loculated parapneumonic effusion Acute kidney injury on CKD stage IIIa None anion gap metabolic acidosis   Normocytic anemia Leukopenia, thrombocytopenia   Multiple myeloma (HCC) Hyponatremia Hypocalcemia     Obesity, Class III, BMI 40-49.9 (morbid obesity) (HCC)   Thrombocytopenia [facility])   Hospital Course:  Patient is a 47 year old female with HTN, HLP, multiple myeloma s/p chemotherapy, now in remission, DVT on Eliquis presented to ED with 3 days of worsening shortness of breath. She was found to have right-sided upper lobe pulmonary consolidation, loculated pleural effusion, AKI. She was placed on antibiotics, underwent right chest tube placement on 10/06/2022. Nephrology, cardiothoracic surgery also consulted. She had insertion of nontunneled CVC catheter placement by PCCM on 8/24, HD started on 10/10/2022.    Assessment and Plan:    Acute respiratory failure with hypoxia (HCC) Community-acquired pneumonia, right-sided with loculated parapneumonic effusion -chest tube was placed and removed on 10/12/2022.  Patient received pleural fibrinolytics as per pulmonology -Completed doxycycline, currently on Rocephin -Transitioned to oral Augmentin for 2 more days to complete full course of 14 days. -Continue albuterol inhaler as needed, placed on fluticasone/ salmeterol ([place]) inhaler twice daily -O2 sats improved, 100% on room air    AKI (acute kidney  injury) (HCC) on CKD stage IIIa, non-anion gap metabolic acidosis -Nephrology consulted, likely ATN due to Toradol and fluid shift with draining pleural effusion, hemodynamic changes. -Renal ultrasound unremarkable. -Right IJ temp HD catheter placed on 8/24 by PCCM, started on HD on 8/24 -Received second HD on 8/26. -Urine output stable and improving, CLIP process was placed on hold as patient started having renal recovery -Creatinine plateaued at 7.88, Creatinine 7.2-> 7.85-> 7.88-> 7.3-> 6.5 -> 5.9 upon discharge HD catheter removed, cleared for discharge by nephrology     Normocytic anemia, anemia of chronic disease -From renal failure, multiple myeloma, chronic illness -Transfused 1 unit packed RBCs on 8/25, 8/27 -Hb 7.9 at discharge     Multiple myeloma (HCC) -Revlimid on hold until reevaluation by Dr. Candise Che as outpatient -Patient will continue to hold Eliquis until renal function improved and cleared to resume by oncology and nephrology.  Patient reports that she takes eliquis for DVT prophylaxis with chemotherapy.   Leukopenia, -Resolved   Thrombocytopenia (HCC) -Resolved   Hyponatremia -Resolved 137 at discharge  Hypocalcemia -Albumin on 8/24 1.8 -Improving     Obesity, Class III, BMI 40-49.9 (morbid obesity) (HCC) Estimated body mass index is 44.8 kg/m as calculated from the following:   Height as of this encounter: 5' 3.25" (1.607 m).   Weight as of this encounter: 115.6 kg.        Pain control - [employer] Controlled Substance Reporting System database was reviewed. and patient was instructed, not to drive, operate heavy machinery, perform activities at heights, swimming or participation in water activities or provide baby-sitting services while on Pain, Sleep and Anxiety Medications; until their outpatient Physician has advised to do so again. Also recommended to not to take more than prescribed  Pain, Sleep and Anxiety Medications.  Consultants: Nephrology,  PCCM, CT surgery Procedures performed: Chest tube placement and removal, HD Disposition: Home Diet recommendation:  Discharge Diet Orders (From admission, onward)     Start     Ordered   10/19/22 0000  Diet - low sodium heart healthy        10/19/22 1005            DISCHARGE MEDICATION: Allergies as of 10/19/2022       Reactions   Duloxetine Hcl Hives   Zithromax [azithromycin] Rash        Medication List     STOP taking these medications    Eliquis 5 MG Tabs tablet Generic drug: apixaban   furosemide 20 MG tablet Commonly known as: LASIX   potassium chloride SA 20 MEQ tablet Commonly known as: KLOR-CON M       TAKE these medications    acyclovir 400 MG tablet Commonly known as: ZOVIRAX Take 1 tablet (400 mg total) by mouth 2 (two) times daily.   albuterol 108 (90 Base) MCG/ACT inhaler Commonly known as: VENTOLIN HFA Inhale 1-2 puffs into the lungs every 6 (six) hours as needed for wheezing.   amLODipine 5 MG tablet Commonly known as: NORVASC Take 1 tablet (5 mg total) by mouth daily. HOLD until follow-up appointment with your PCP. What changed: additional instructions   amoxicillin-clavulanate 875-125 MG tablet Commonly known as: AUGMENTIN Take 1 tablet by mouth 2 (two) times daily for 2 days.   b complex vitamins capsule Take 1 capsule by mouth daily.   docusate sodium 100 MG capsule Commonly known as: COLACE Take 1 capsule (100 mg total) by mouth 2 (two) times daily. Also available OTC.   ergocalciferol 1.25 MG (50000 UT) capsule Commonly known as: VITAMIN D2 Take 1 capsule (50,000 Units total) by mouth once a week. What changed: when to take this   fluticasone-salmeterol 500-50 MCG/ACT Aepb Commonly known as: Wixela Inhub Inhale 1 puff into the lungs in the morning and at bedtime.   gabapentin 100 MG capsule Commonly known as: NEURONTIN Take 1 capsule (100 mg total) by mouth 3 (three) times daily. What changed:  medication  strength how much to take how to take this when to take this additional instructions   lenalidomide 10 MG capsule Commonly known as: Revlimid TAKE 1 CAPSULE BY MOUTH DAILY  FOR 21 DAYS, THEN 7 DAYS OFF What changed:  how much to take how to take this when to take this   medroxyPROGESTERone 10 MG tablet Commonly known as: PROVERA Take 10 mg by mouth daily.   metoprolol tartrate 50 MG tablet Commonly known as: LOPRESSOR Take 1 tablet (50 mg total) by mouth 2 (two) times daily.   omeprazole 20 MG capsule Commonly known as: PRILOSEC Take 20 mg by mouth daily.   ondansetron 4 MG tablet Commonly known as: ZOFRAN Take 2 tablets (8 mg total) by mouth every 8 (eight) hours as needed for nausea or vomiting. What changed:  when to take this reasons to take this   oxyCODONE 5 MG immediate release tablet Commonly known as: Oxy IR/ROXICODONE TAKE 1 TABLET EVERY 6 HOURS AS NEEDED FOR PAIN. What changed: reasons to take this   polyethylene glycol 17 g packet Commonly known as: MIRALAX / GLYCOLAX Take 17 g by mouth daily as needed for mild constipation.   pravastatin 40 MG tablet Commonly known as: PRAVACHOL Take 40 mg by mouth daily.   prochlorperazine 10 MG tablet Commonly known  as: COMPAZINE Take 1 tablet (10 mg total) by mouth every 6 (six) hours as needed for nausea or vomiting.        Follow-up Information     Benita Stabile, MD. Schedule an appointment as soon as possible for a visit in 10 day(s).   Specialty: Internal Medicine Why: for hospital follow-up, obtain labs BMET Contact information: [address] [place] [facility] [address] [phone]         Annie Sable, MD. Schedule an appointment as soon as possible for a visit in 10 day(s).   Specialty: Nephrology Why: for hospital follow-up, obtain labs, BMET.  Ambulatory referral to nephrology office sent. Contact information: [address] [place] [address] [phone]         Coralyn Helling, MD. Schedule an appointment as soon as possible for a visit in 2 week(s).   Specialties: Pulmonary Disease, Sleep Medicine Why: for hospital follow-up.  Ambulatory referral to pulmonology office is sent.  Please call tomorrow to make an appointment within next 10 to 14 days. Contact information: [address] [place] [address] [phone]                Discharge Exam: Ceasar Mons Weights   10/17/22 0344 10/18/22 0305 10/19/22 0515  Weight: 116.4 kg 115.6 kg 113.3 kg   S: Looking forward to go home today.  Renal function improving.  Good urine output, no chest pain, shortness of breath, headache or blurred vision.  BP (!) 153/81 (BP Location: Right Arm)   Pulse 82   Temp 98.8 F (37.1 C) (Oral)   Resp 13   Ht 5' 3.25" (1.607 m)   Wt 113.3 kg   SpO2 100%   BMI 43.90 kg/m   Physical Exam General: Alert and oriented x 3, NAD Cardiovascular: S1 S2 clear, RRR.  Respiratory: CTAB, no wheezing Gastrointestinal: Soft, nontender, nondistended, NBS Ext: 1+ pedal edema bilaterally Neuro: no new deficits Psych: Normal affect    Condition at discharge: fair  The results of significant diagnostics from this hospitalization (including imaging, microbiology, ancillary and laboratory) are listed below for reference.   Imaging Studies: DG Chest 2 View  Result Date: 10/13/2022 CLINICAL DATA:  142230 Pleural effusion 142230. Shortness of breath. EXAM: CHEST - 2 VIEW COMPARISON:  Chest radiograph 10/12/2022. FINDINGS: 0611 hours. Unchanged right IJ approach central venous catheter with tip projecting over the low SVC. Interval removal of the right sided pleural drainage catheter. No residual pneumothorax. Unchanged moderate right pleural effusion with adjacent right basilar atelectasis. IMPRESSION: 1. Interval removal of the right sided pleural drainage catheter. No residual pneumothorax. 2. Unchanged moderate right pleural effusion with adjacent right basilar  atelectasis. Electronically Signed   By: Orvan Falconer M.D.   On: 10/13/2022 10:16   DG CHEST PORT 1 VIEW  Result Date: 10/12/2022 CLINICAL DATA:  Pleural effusion with pleural drain in situ EXAM: PORTABLE CHEST 1 VIEW COMPARISON:  10/10/2022 FINDINGS: Right base collapse/consolidation with effusion is similar to prior. Right pleural drain again noted. No evidence for right-sided pneumothorax. Right IJ central line tip overlies the mid SVC. Left lung clear. The cardiopericardial silhouette is within normal limits for size. Telemetry leads overlie the chest. IMPRESSION: Stable exam. Right base collapse/consolidation with effusion. No pneumothorax. Electronically Signed   By: Kennith Center M.D.   On: 10/12/2022 14:37   CT CHEST WO CONTRAST  Result Date: 10/10/2022 CLINICAL DATA:  Right-sided pleural effusion. Multiple myeloma. * Tracking Code: BO * EXAM:  CT CHEST WITHOUT CONTRAST TECHNIQUE: Multidetector CT imaging of the chest was performed following the standard protocol without IV contrast. RADIATION DOSE REDUCTION: This exam was performed according to the departmental dose-optimization program which includes automated exposure control, adjustment of the mA and/or kV according to patient size and/or use of iterative reconstruction technique. COMPARISON:  Chest radiograph on 10/10/2022 FINDINGS: Cardiovascular:  No acute findings. Mediastinum/Nodes: No masses or pathologically enlarged lymph nodes identified on this unenhanced exam. Lungs/Pleura: Pigtail catheter remains in place. Small right pleural effusion has decreased in size. Decreased compressive atelectasis seen in the right middle and lower lobes. Persistent elevation of right hemidiaphragm noted. Focal area of airspace opacity in the left anterior apex shows mild improvement since previous study. No new or worsening areas of pulmonary opacity are seen. No No evidence of central endobronchial obstruction. Upper Abdomen:  Unremarkable.  Musculoskeletal: Stable appearance of lytic bone lesions throughout the thorax, consistent with known history of multiple myeloma. IMPRESSION: Decreased size of small right pleural effusion, with decreased compressive atelectasis in right middle and lower lobes. Mild improvement in focal left apical airspace opacity, consistent with resolving infectious or inflammatory process. Stable appearance of lytic bone lesions throughout the thorax, consistent with known history of multiple myeloma. Electronically Signed   By: Danae Orleans M.D.   On: 10/10/2022 15:32   DG Chest Port 1 View  Result Date: 10/10/2022 CLINICAL DATA:  Central line placement EXAM: PORTABLE CHEST 1 VIEW COMPARISON:  10/10/2022 at 6:32 a.m. FINDINGS: Dual lumen right IJ central line tip: SVC.  No visible pneumothorax. Pigtail right pleural drainage catheter noted with large right pleural effusion. This is stable from earlier today. The left lung remains clear. Scattered abnormal osseous lucencies in the skeleton compatible with myeloma. IMPRESSION: 1. Dual lumen right IJ central line tip: SVC. No visible pneumothorax. 2. Stable large right pleural effusion with pigtail pleural drainage catheter in place. 3. Scattered osseous lucencies compatible with myeloma. Electronically Signed   By: Gaylyn Rong M.D.   On: 10/10/2022 15:25   DG CHEST PORT 1 VIEW  Result Date: 10/10/2022 CLINICAL DATA:  Chest tube in place.  Right pleural effusion. EXAM: PORTABLE CHEST 1 VIEW COMPARISON:  One-view chest x-ray 10/09/2022 FINDINGS: The heart size is normal. Lung volumes are low. A right-sided pigtail catheter is stable in position. The right pleural effusion is increasing. Associated airspace disease is noted. The left lung is clear. IMPRESSION: 1. Increasing right pleural effusion and airspace disease. 2. Stable right-sided pigtail catheter. Electronically Signed   By: Marin Roberts M.D.   On: 10/10/2022 10:33   DG Chest Port 1 View  Result  Date: 10/09/2022 CLINICAL DATA:  Chest tube in place. EXAM: PORTABLE CHEST 1 VIEW COMPARISON:  October 08, 2022. FINDINGS: The heart size and mediastinal contours are within normal limits. Right-sided chest tube is unchanged in position. No pneumothorax is noted. Mild to moderate right pleural effusion is noted which is slightly decreased compared to prior exam, with associated right basilar atelectasis. Left lung is unremarkable. Stable expansile lesions are noted in the ribs bilaterally consistent with history of multiple myeloma. IMPRESSION: Stable position of right-sided chest tube. Right pleural effusion is slightly decreased in size. Electronically Signed   By: Lupita Raider M.D.   On: 10/09/2022 09:04   DG CHEST PORT 1 VIEW  Result Date: 10/08/2022 CLINICAL DATA:  Chest pain EXAM: PORTABLE CHEST 1 VIEW COMPARISON:  Chest x-ray 10/05/2022.  Chest CT 10/07/2022. FINDINGS: Right pleural drainage catheter is  present. There is a small to moderate-sized right pleural effusion which has decreased. Underlying right basilar airspace disease appears stable. No pneumothorax. The cardiomediastinal silhouette is stable. Osseous structures are unchanged. IMPRESSION: 1. Right pleural drainage catheter in place with a small to moderate-sized right pleural effusion which has decreased. 2. Stable underlying right basilar airspace disease. Electronically Signed   By: Darliss Cheney M.D.   On: 10/08/2022 19:13   DG CHEST PORT 1 VIEW  Result Date: 10/08/2022 CLINICAL DATA:  47 year old female with loculated pleural effusion and chest tube. EXAM: PORTABLE CHEST 1 VIEW COMPARISON:  Chest CT 10/07/2022 and earlier. FINDINGS: Portable AP semi upright view at 0755 hours. Pigtail right chest tube appears stable and configuration from the CT yesterday. Compared to that scout view right upper lung region pleural effusion has progressed, with increased right upper lung compressive atelectasis. Subtotal opacification of the right  lower lung not significantly changed. Slightly increased leftward mediastinal and tracheal shift. Left lung appears stable and negative. Paucity of bowel gas in the upper abdomen. Stable visualized osseous structures. IMPRESSION: Progressive right pleural effusion and right lung compressive atelectasis since yesterday despite indwelling right pigtail chest tube. Electronically Signed   By: Odessa Fleming M.D.   On: 10/08/2022 08:55   CT CHEST WO CONTRAST  Result Date: 10/07/2022 CLINICAL DATA:  Chronic dyspnea.  Loculated pleural effusion. EXAM: CT CHEST WITHOUT CONTRAST TECHNIQUE: Multidetector CT imaging of the chest was performed following the standard protocol without IV contrast. RADIATION DOSE REDUCTION: This exam was performed according to the departmental dose-optimization program which includes automated exposure control, adjustment of the mA and/or kV according to patient size and/or use of iterative reconstruction technique. COMPARISON:  October 05, 2022. FINDINGS: Cardiovascular: No significant vascular findings. Normal heart size. No pericardial effusion. Mediastinum/Nodes: Thyroid gland is not well visualized. Esophagus is unremarkable. Stable right paratracheal adenopathy is noted which most likely is inflammatory in etiology. Lungs/Pleura: There is been interval placement of pigtail pleural drainage catheter into loculated right pleural effusion. The effusion is not significantly changed in size. Continued opacity seen in right lower lobe and upper lobe consistent with pneumonia or atelectasis. Air bronchograms are noted. No pneumothorax is noted. Minimal left lingular subsegmental atelectasis or scarring is noted. New opacity is noted anteriorly in left upper lobe concerning for pneumonia. Upper Abdomen: No acute abnormality. Musculoskeletal: Status post T12 kyphoplasty. Stable lytic and sclerotic densities throughout visualized skeleton consistent with history of multiple myeloma. IMPRESSION: Interval  placement of pigtail drainage catheter into loculated right pleural effusion which is not significantly changed in size. Continued right upper and lower lobe opacities are noted consistent with pneumonia or atelectasis. New opacity is noted anteriorly in left upper lobe concerning for pneumonia. Stable osseous lesions consistent with history of multiple myeloma. Electronically Signed   By: Lupita Raider M.D.   On: 10/07/2022 15:11   DG Chest Port 1 View  Result Date: 10/07/2022 CLINICAL DATA:  Chest tube surveillance EXAM: PORTABLE CHEST 1 VIEW COMPARISON:  10/06/2022 FINDINGS: Right basilar chest tube remains in place. Configuration of the chest tube raises the possibility for tube kinking. Moderate-large right pleural effusion is similar in size to prior. Associated right basilar opacity. No pneumothorax. Heart size is normal. Left lung is clear. IMPRESSION: Moderate-large right pleural effusion, similar in size to prior. Right basilar chest tube remains in place. Configuration of the chest tube raises the possibility for tube kinking. No pneumothorax. Electronically Signed   By: Duanne Guess D.O.  On: 10/07/2022 11:30   US RENAL  Result Date: 10/06/2022 CLINICAL DATA:  Acute kidney injury. EXAM: RENAL / URINARY TRACT ULTRASOUND COMPLETE COMPARISON:  Limited right upper quadrant abdomen ultrasound dated 10/05/2022. Chest abdomen and pelvis CT dated 04/02/2020. Nuclear medicine PET-CT dated 04/24/2020 multiple myeloma. FINDINGS: Right Kidney: Renal measurements: 11.8 x 7.2 x 5 4 cm = volume: 142 mL. Echogenicity within normal limits. No mass or hydronephrosis visualized. Left Kidney: Renal measurements: 12.0 x 6.4 x 6.4 cm = volume: 161 mL. Echogenicity within normal limits. No mass or hydronephrosis visualized. Bladder: Appears normal for degree of bladder distention. Other: Stable diffusely echogenic liver with no corresponding CT abnormality. IMPRESSION: 1. Normal renal ultrasound. 2. Stable  diffusely echogenic liver. This can be seen with steatosis, cirrhosis and chronic hepatitis. Electronically Signed   By: Beckie Salts M.D.   On: 10/06/2022 16:34   DG CHEST PORT 1 VIEW  Result Date: 10/06/2022 CLINICAL DATA:  Chest tube placement. EXAM: PORTABLE CHEST 1 VIEW COMPARISON:  October 05, 2022 FINDINGS: Right-sided chest tube has been placed. No evidence of pneumothorax. Similar opacification of most of the right lung. The left lung is clear. IMPRESSION: 1. Right-sided chest tube has been placed. No evidence of pneumothorax. 2. Similar opacification of most of the right lung. Electronically Signed   By: Ted Mcalpine M.D.   On: 10/06/2022 12:17   US Abdomen Limited RUQ (LIVER/GB)  Result Date: 10/05/2022 CLINICAL DATA:  Elevated LFTs EXAM: ULTRASOUND ABDOMEN LIMITED RIGHT UPPER QUADRANT COMPARISON:  PET/CT 04/24/2020 FINDINGS: Gallbladder: No gallstones or wall thickening visualized. No sonographic Murphy sign noted by sonographer. Common bile duct: Diameter: 3 mm Liver: No focal lesion identified. Increased parenchymal echogenicity with coarse echotexture. Portal vein is patent on color Doppler imaging with normal direction of blood flow towards the liver. Other: Exam is compromised by body habitus and bowel gas. A right pleural effusion is noted. IMPRESSION: 1. Hepatic steatosis. 2. Partially visualized right pleural effusion. Electronically Signed   By: Minerva Fester M.D.   On: 10/05/2022 18:58   US THORACENTESIS ASP PLEURAL SPACE W/IMG GUIDE  Result Date: 10/05/2022 INDICATION: Patient with history of multiple myeloma in remission; now with cough, dyspnea, right pleural effusion. Request received for diagnostic and therapeutic right thoracentesis EXAM: ULTRASOUND GUIDED DIAGNOSTIC AND THERAPEUTIC RIGHT THORACENTESIS MEDICATIONS: 8 mL of 1% lidocaine COMPLICATIONS: None immediate. PROCEDURE: An ultrasound guided thoracentesis was thoroughly discussed with the patient and questions  answered. The benefits, risks, alternatives and complications were also discussed. The patient understands and wishes to proceed with the procedure. Written consent was obtained. Ultrasound was performed to localize and mark an adequate pocket of fluid in the right chest. The area was then prepped and draped in the normal sterile fashion. 1% Lidocaine was used for local anesthesia. Under ultrasound guidance a 6 Fr Safe-T-Centesis catheter was introduced. Thoracentesis was performed. The catheter was removed and a dressing applied. FINDINGS: A total of approximately 115 cc of yellow fluid was removed. Samples were sent to the laboratory as requested by the clinical team. The pleural fluid collection was multiloculated and only the above amount could be aspirated at this time. IMPRESSION: Successful ultrasound guided diagnostic and therapeutic RIGHT thoracentesis yielding 115 mL of pleural fluid. Performed by: Artemio Aly Electronically Signed   By: Roanna Banning M.D.   On: 10/05/2022 17:05   DG Chest 1 View  Result Date: 10/05/2022 CLINICAL DATA:  [id number] S/P thoracentesis [id number] EXAM: PORTABLE CHEST 1 VIEW COMPARISON:  CT chest, 10/05/2022.  IR ultrasound, earlier same day. FINDINGS: Cardiac silhouette is within normal limits. Persistently obscured RIGHT cardiac border. The LEFT lung is clear. Similar dense consolidation of the RIGHT lower lobe. No pneumothorax. No interval osseous abnormality. IMPRESSION: 1. No pneumothorax post RIGHT thoracentesis 2. Persistent dense consolidation of the RIGHT lower lobe. Consider 4 weeks chest XR follow-up after treatment to ensure resolution Electronically Signed   By: Roanna Banning M.D.   On: 10/05/2022 16:59   CT Chest Wo Contrast  Result Date: 10/05/2022 CLINICAL DATA:  Pleural effusion. Shortness of breath. Malignancy suspected. History of multiple myeloma. EXAM: CT CHEST WITHOUT CONTRAST TECHNIQUE: Multidetector CT imaging of the chest was performed following the  standard protocol without IV contrast. RADIATION DOSE REDUCTION: This exam was performed according to the departmental dose-optimization program which includes automated exposure control, adjustment of the mA and/or kV according to patient size and/or use of iterative reconstruction technique. COMPARISON:  Chest x-ray same day. CT chest abdomen and pelvis 04/02/2020. FINDINGS: Cardiovascular: No significant vascular findings. Normal heart size. No pericardial effusion. Mediastinum/Nodes: There are enlarged paratracheal lymph nodes measuring up to 15 mm short axis. Enlarged right hilar lymph node measures up to 11 mm short axis. Visualized esophagus and thyroid gland are within normal limits. Lungs/Pleura: There is a moderate-sized loculated right pleural effusion. There is underlying atelectasis/compression of the right lower lobe and right middle lobe. There is also some patchy and strandy opacities in the right lung apex. Left lung appears clear. Upper Abdomen: No acute abnormality. Musculoskeletal: Numerous lytic lesions are seen throughout the osseous structures. These have mildly increased in size and number when compared to the prior study. Mild compression deformities of the superior endplate of T7 and T8 appear new from prior. No retropulsion of fracture fragments. IMPRESSION: 1. Moderate-sized loculated right pleural effusion 2. Atelectasis/compression of the right lower lobe and right middle lobe. Underlying pneumonia or neoplasm not excluded. 3. Patchy and strandy opacities in the right lung apex worrisome for infection. 4. Mediastinal and right hilar lymphadenopathy. 5. Diffuse lytic lesions throughout the osseous structures compatible with patient's history of multiple myeloma. These have mildly increased in size and number when compared to the prior study. 6. Mild compression deformities of the superior endplates of T7 and T8 appear new from prior. Electronically Signed   By: Darliss Cheney M.D.   On:  10/05/2022 16:00   DG Chest 2 View  Result Date: 10/05/2022 CLINICAL DATA:  Shortness of breath. EXAM: CHEST - 2 VIEW COMPARISON:  Chest radiograph 04/04/2020.  Chest CT 04/02/2020 FINDINGS: Moderate to large right pleural effusion, occupying the lower 2/3 of right hemithorax. No definite leftward mediastinal shift suggesting underlying compressive atelectasis in the right lung. There are streaky right suprahilar opacities. The heart is upper normal in size, although primarily obscured. No focal airspace disease in the left lung. There are bilateral lytic rib lesions. Many of the additional bone lesions on CT are not well-defined by radiograph. IMPRESSION: 1. Moderate to large right pleural effusion, occupying the lower 2/3 of the right hemithorax. Streaky right suprahilar opacities, favor. 2. Bilateral lytic rib lesions consistent with known multiple myeloma. Electronically Signed   By: Narda Rutherford M.D.   On: 10/05/2022 13:43    Microbiology: Results for orders placed or performed during the hospital encounter of 10/05/22  Resp panel by RT-PCR (RSV, Flu A&B, Covid) Anterior Nasal Swab     Status: None   Collection Time: 10/05/22 11:18 AM   Specimen: Anterior Nasal Swab  Result Value Ref Range Status   SARS Coronavirus 2 by RT PCR NEGATIVE NEGATIVE Final    Comment: (NOTE) SARS-CoV-2 target nucleic acids are NOT DETECTED.  The SARS-CoV-2 RNA is generally detectable in upper respiratory specimens during the acute phase of infection. The lowest concentration of SARS-CoV-2 viral copies this assay can detect is 138 copies/mL. A negative result does not preclude SARS-Cov-2 infection and should not be used as the sole basis for treatment or other patient management decisions. A negative result may occur with  improper specimen collection/handling, submission of specimen other than nasopharyngeal swab, presence of viral mutation(s) within the areas targeted by this assay, and inadequate number  of viral copies(<138 copies/mL). A negative result must be combined with clinical observations, patient history, and epidemiological information. The expected result is Negative.  Fact Sheet for Patients:  BloggerCourse.com  Fact Sheet for Healthcare Providers:  SeriousBroker.it  This test is no t yet approved or cleared by the [place] FDA and  has been authorized for detection and/or diagnosis of SARS-CoV-2 by FDA under an Emergency Use Authorization (EUA). This EUA will remain  in effect (meaning this test can be used) for the duration of the COVID-19 declaration under Section 564(b)(1) of the Act, 21 U.S.C.section 360bbb-3(b)(1), unless the authorization is terminated  or revoked sooner.       Influenza A by PCR NEGATIVE NEGATIVE Final   Influenza B by PCR NEGATIVE NEGATIVE Final    Comment: (NOTE) The Xpert Xpress SARS-CoV-2/FLU/RSV plus assay is intended as an aid in the diagnosis of influenza from Nasopharyngeal swab specimens and should not be used as a sole basis for treatment. Nasal washings and aspirates are unacceptable for Xpert Xpress SARS-CoV-2/FLU/RSV testing.  Fact Sheet for Patients: BloggerCourse.com  Fact Sheet for Healthcare Providers: SeriousBroker.it  This test is not yet approved or cleared by the [place] FDA and has been authorized for detection and/or diagnosis of SARS-CoV-2 by FDA under an Emergency Use Authorization (EUA). This EUA will remain in effect (meaning this test can be used) for the duration of the COVID-19 declaration under Section 564(b)(1) of the Act, 21 U.S.C. section 360bbb-3(b)(1), unless the authorization is terminated or revoked.     Resp Syncytial Virus by PCR NEGATIVE NEGATIVE Final    Comment: (NOTE) Fact Sheet for Patients: BloggerCourse.com  Fact Sheet for Healthcare  Providers: SeriousBroker.it  This test is not yet approved or cleared by the [place] FDA and has been authorized for detection and/or diagnosis of SARS-CoV-2 by FDA under an Emergency Use Authorization (EUA). This EUA will remain in effect (meaning this test can be used) for the duration of the COVID-19 declaration under Section 564(b)(1) of the Act, 21 U.S.C. section 360bbb-3(b)(1), unless the authorization is terminated or revoked.  Performed at [facility], [address] [place], [place] [address]   Body fluid culture w Gram Stain     Status: None   Collection Time: 10/05/22  4:55 PM   Specimen: Pleura; Body Fluid  Result Value Ref Range Status   Specimen Description   Final    PLEURAL Performed at [facility], [address] [place], [place] [address]    Special Requests   Final    NONE Performed at [facility], [address], [place], [place] [address]    Gram Stain   Final    CYTOSPIN SMEAR WBC PRESENT, PREDOMINANTLY PMN NO ORGANISMS SEEN    Culture   Final  NO GROWTH 3 DAYS Performed at [facility], [address], [place], [place] [address]    Report Status 10/10/2022 FINAL  Final  Culture, blood (Routine X 2) w Reflex to ID Panel     Status: None   Collection Time: 10/06/22  8:26 AM   Specimen: BLOOD  Result Value Ref Range Status   Specimen Description   Final    BLOOD BLOOD LEFT HAND Performed at [facility], [address], [place], [place] [address]    Special Requests   Final    BOTTLES DRAWN AEROBIC ONLY Blood Culture adequate volume Performed at [facility], [address], [place], [place] [address]    Culture   Final    NO GROWTH 5 DAYS Performed at [facility], [address] [place], [place] [address]    Report Status 10/11/2022 FINAL  Final  Culture, blood (Routine X 2) w Reflex to ID Panel      Status: None   Collection Time: 10/06/22  8:26 AM   Specimen: BLOOD  Result Value Ref Range Status   Specimen Description   Final    BLOOD BLOOD LEFT ARM Performed at [facility], [address] [place], [place] [address]    Special Requests   Final    BOTTLES DRAWN AEROBIC AND ANAEROBIC Blood Culture adequate volume Performed at [facility], [address] [place], [place] [address]    Culture   Final    NO GROWTH 5 DAYS Performed at [facility], [address], [place], [place] [address]    Report Status 10/11/2022 FINAL  Final  Expectorated Sputum Assessment w Gram Stain, Rflx to Resp Cult     Status: None   Collection Time: 10/06/22  8:50 AM   Specimen: Sputum  Result Value Ref Range Status   Specimen Description SPUTUM  Final   Special Requests NONE  Final   Sputum evaluation   Final    THIS SPECIMEN IS ACCEPTABLE FOR SPUTUM CULTURE Performed at [facility], [address] [place], [place] [address]    Report Status 10/06/2022 FINAL  Final  Culture, Respiratory w Gram Stain     Status: None   Collection Time: 10/06/22  8:50 AM   Specimen: SPU  Result Value Ref Range Status   Specimen Description   Final    SPUTUM Performed at [facility], [address] [place], [place] [address]    Special Requests   Final    NONE Reflexed from [id number] Performed at [facility], [address] [place], [place] [address]    Gram Stain   Final    RARE WBC PRESENT,BOTH PMN AND MONONUCLEAR MODERATE GRAM POSITIVE COCCI IN PAIRS FEW GRAM NEGATIVE RODS    Culture   Final    FEW Normal respiratory flora-no Staph aureus or Pseudomonas seen Performed at [facility], [address], [place], [place] [address]    Report Status 10/08/2022 FINAL  Final    Labs: CBC: Recent Labs  Lab 10/13/22 0548 10/13/22 1518 10/14/22 0418 10/16/22 0510 10/18/22 0302 10/19/22 0310   WBC 3.6*  --  4.0 4.4 5.2 5.4  NEUTROABS 2.5  --  2.7  --   --   --   HGB 6.8* 8.0* 8.7* 7.9* 7.7* 7.9*  HCT 19.5* 23.3* 25.3* 23.9* 24.3* 25.2*  MCV 92.9  --  92.7 94.5 98.8 101.2*  PLT 92*  --  104* 122* 140* 151   Basic Metabolic Panel: Recent Labs  Lab 10/15/22 0425 10/16/22 0510 10/17/22 0309 10/18/22 0302 10/19/22 0310  NA 135 139 140 141 137  K 4.4 4.3 3.9 4.1 3.8  CL 102 107 109 109 110  CO2 18* 18* 18* 18* 18*  GLUCOSE 96 105* 124* 103* 106*  BUN 95* 104* 94* 78* 69*  CREATININE 7.85* 7.88* 7.33* 6.50* 5.90*  CALCIUM 8.0* 8.5* 8.5* 8.6* 8.5*  PHOS 9.8* 9.2* 7.8* 6.8* 6.5*   Liver Function Tests: Recent Labs  Lab 10/15/22 0425 10/16/22 0510 10/17/22 0309 10/18/22 0302 10/19/22 0310  ALBUMIN 1.9* 2.0* 2.0* 2.2* 2.2*   CBG: No results for input(s): "GLUCAP" in the last 168 hours.  Discharge time spent: greater than 30 minutes.  Signed: Thad Ranger, MD [facility] 10/19/2022

## 2022-10-19 NOTE — Progress Notes (Signed)
Patient ID: Stacey Montoya, female   DOB: 06/22/1975, 47 y.o.   MRN: [id number] [facility] Progress Note   Assessment/ Plan:   1. Acute kidney Injury on chronic kidney disease stage III (baseline creatinine around 1.2): Acute injury suspected to be secondary to ketorolac use/hemodynamically mediated with fluid shifts.  Currently with excellent urine output without supplemental diuretics and downtrending BUN/creatinine noted after previously undergoing 2 dialysis treatments on 8/24 and 8/26.  Plans noted to discharge home today and will confirm she has outpatient follow-up at the [facility] office when the scheduling staff returns tomorrow. 2.  Loculated parapneumonic effusion: Status post chest tube placement and pleurodesis.  On antimicrobial coverage with Augmentin. 3.  History of multiple myeloma status post autologous stem cell transplant in 2022 at [facility].  Light chain ratio unremarkable. 4.  Anemia: Possibly secondary to acute illness/ESA resistance.  Monitor trend with outpatient follow-up by hematology. 5.  Hyperphosphatemia: Improving with renal recovery/ongoing calcium acetate use for phosphorus binding.  Subjective:   Reports to be feeling well and especially better after removal of right IJ temporary dialysis catheter today.   Objective:   BP (!) 153/81 (BP Location: Right Arm)   Pulse 82   Temp 98.8 F (37.1 C) (Oral)   Resp 13   Ht 5' 3.25" (1.607 m)   Wt 113.3 kg   SpO2 100%   BMI 43.90 kg/m   Intake/Output Summary (Last 24 hours) at 10/19/2022 0953 Last data filed at 10/19/2022 0753 Gross per 24 hour  Intake 600 ml  Output 2800 ml  Net -2200 ml   Weight change: -2.322 kg  Physical Exam: Gen: Comfortably resting in bed, speaking on cell phone CVS: Pulse regular rhythm, normal rate, S1 and S2 normal Resp: Clear to auscultation bilaterally, no rales/rhonchi Abd: Soft, obese, nontender, bowel sounds normal Ext: 1-2+  anasarca  Imaging: No results found.  Labs: BMET Recent Labs  Lab 10/13/22 0548 10/14/22 0418 10/15/22 0425 10/16/22 0510 10/17/22 0309 10/18/22 0302 10/19/22 0310  NA 131* 133* 135 139 140 141 137  K 3.3* 4.1 4.4 4.3 3.9 4.1 3.8  CL 94* 98 102 107 109 109 110  CO2 20* 18* 18* 18* 18* 18* 18*  GLUCOSE 94 96 96 105* 124* 103* 106*  BUN 67* 82* 95* 104* 94* 78* 69*  CREATININE 6.55* 7.29* 7.85* 7.88* 7.33* 6.50* 5.90*  CALCIUM 7.2* 7.8* 8.0* 8.5* 8.5* 8.6* 8.5*  PHOS 7.5* 9.1* 9.8* 9.2* 7.8* 6.8* 6.5*   CBC Recent Labs  Lab 10/13/22 0548 10/13/22 1518 10/14/22 0418 10/16/22 0510 10/18/22 0302 10/19/22 0310  WBC 3.6*  --  4.0 4.4 5.2 5.4  NEUTROABS 2.5  --  2.7  --   --   --   HGB 6.8*   < > 8.7* 7.9* 7.7* 7.9*  HCT 19.5*   < > 25.3* 23.9* 24.3* 25.2*  MCV 92.9  --  92.7 94.5 98.8 101.2*  PLT 92*  --  104* 122* 140* 151   < > = values in this interval not displayed.    Medications:     acyclovir  400 mg Oral BID   amoxicillin-clavulanate  1 tablet Oral Q12H   arformoterol  15 mcg Nebulization BID   budesonide (PULMICORT) nebulizer solution  0.5 mg Nebulization BID   calcium acetate  1,334 mg Oral BID WC   Chlorhexidine Gluconate Cloth  6 each Topical Daily   docusate sodium  100 mg Oral BID   gabapentin  100  mg Oral TID   heparin injection (subcutaneous)  5,000 Units Subcutaneous Q8H   medroxyPROGESTERone  10 mg Oral Daily   metoprolol tartrate  25 mg Oral BID   nicotine  21 mg Transdermal Q24H   pantoprazole  40 mg Oral Daily   polyethylene glycol  17 g Oral Daily   pravastatin  40 mg Oral Daily   revefenacin  175 mcg Nebulization Daily    Zetta Bills, MD 10/19/2022, 9:53 AM

## 2022-10-19 NOTE — TOC Transition Note (Signed)
Transition of Care (TOC) - CM/SW Discharge Note Donn Pierini RN, BSN Transitions of Care Unit 4E- RN Case Manager See Treatment Team for direct phone #   Patient Details  Name: JEREMIE SALTARELLI MRN: [id number] Date of Birth: [birth year]  Transition of Care [facility]) CM/SW Contact:  Darrold Span, RN Phone Number: 10/19/2022, 11:00 AM   Clinical Narrative:    Pt stable for transition home today, no TOC needs noted. Pt to follow up as per AVS instructions. Family to transport home.    Final next level of care: Home/Self Care Barriers to Discharge: Barriers Resolved   Patient Goals and CMS Choice   Choice offered to / list presented to : NA  Discharge Placement               Home          Discharge Plan and Services Additional resources added to the After Visit Summary for   In-house Referral: NA Discharge Planning Services: NA Post Acute Care Choice: NA          DME Arranged: N/A DME Agency: NA       HH Arranged: NA HH Agency: NA        Social Determinants of Health (SDOH) Interventions SDOH Screenings   Food Insecurity: No Food Insecurity (10/05/2022)  Housing: Low Risk  (10/05/2022)  Transportation Needs: No Transportation Needs (10/05/2022)  Utilities: Not At Risk (10/05/2022)  Tobacco Use: Medium Risk (10/05/2022)     Readmission Risk Interventions    10/19/2022   11:00 AM 10/19/2022   10:59 AM 10/06/2022   11:35 AM  Readmission Risk Prevention Plan  Transportation Screening  Complete Complete  PCP or Specialist Appt within 3-5 Days   Complete  HRI or Home Care Consult   Complete  Social Work Consult for Recovery Care Planning/Counseling   Complete  Palliative Care Screening   Not Applicable  Medication Review Oceanographer)  Complete Complete  PCP or Specialist appointment within 3-5 days of discharge Not Complete    PCP/Specialist Appt Not Complete comments per AVS instructions    HRI or Home Care Consult  Complete   SW Recovery  Care/Counseling Consult  Complete   Palliative Care Screening  Not Applicable   Skilled Nursing Facility  Not Applicable

## 2022-10-19 NOTE — Progress Notes (Signed)
Central line Removal Note: Central line removed from [facility] after site prepped per protocol. Catheter tip visualized and intact. Pressure held until hemostasis achieved. Pressure dressing applied. No redness, ecchymosis, edema, swelling, or drainage noted at site. Instructions provided on post PICC discharge care, including followup notification instructions.  Bedrest until 0930.

## 2022-10-19 NOTE — Care Management Important Message (Signed)
Important Message  Patient Details  Name: Stacey Montoya MRN: [id number] Date of Birth: 1975-08-15   Medicare Important Message Given:  Yes     Renie Ora 10/19/2022, 11:26 AM

## 2022-10-19 NOTE — Plan of Care (Addendum)
Discharge instructions discussed with patient.  Patient instructed on home medications, restrictions, and follow up appointments. Belongings gathered and sent with patient.  Patients medications [facility] [place], and script given for Colace  Dr. Isidoro Donning spoke to patient regarding Laxix and Eliquis.

## 2022-10-21 ENCOUNTER — Encounter: Payer: Self-pay | Admitting: Hematology

## 2022-11-02 ENCOUNTER — Other Ambulatory Visit: Payer: Self-pay | Admitting: Hematology

## 2022-11-02 DIAGNOSIS — C9001 Multiple myeloma in remission: Secondary | ICD-10-CM

## 2022-11-04 ENCOUNTER — Encounter: Payer: Self-pay | Admitting: Hematology

## 2022-11-09 ENCOUNTER — Other Ambulatory Visit: Payer: Self-pay

## 2022-11-09 DIAGNOSIS — C9000 Multiple myeloma not having achieved remission: Secondary | ICD-10-CM

## 2022-11-10 ENCOUNTER — Inpatient Hospital Stay (HOSPITAL_BASED_OUTPATIENT_CLINIC_OR_DEPARTMENT_OTHER): Payer: 59 | Admitting: Hematology

## 2022-11-10 ENCOUNTER — Inpatient Hospital Stay: Payer: 59

## 2022-11-10 ENCOUNTER — Inpatient Hospital Stay: Payer: 59 | Attending: Hematology

## 2022-11-10 VITALS — BP 107/54 | HR 82 | Temp 97.9°F | Resp 20 | Wt 245.9 lb

## 2022-11-10 DIAGNOSIS — Z801 Family history of malignant neoplasm of trachea, bronchus and lung: Secondary | ICD-10-CM | POA: Diagnosis not present

## 2022-11-10 DIAGNOSIS — D630 Anemia in neoplastic disease: Secondary | ICD-10-CM | POA: Insufficient documentation

## 2022-11-10 DIAGNOSIS — G893 Neoplasm related pain (acute) (chronic): Secondary | ICD-10-CM | POA: Diagnosis not present

## 2022-11-10 DIAGNOSIS — Z87891 Personal history of nicotine dependence: Secondary | ICD-10-CM | POA: Insufficient documentation

## 2022-11-10 DIAGNOSIS — Z800 Family history of malignant neoplasm of digestive organs: Secondary | ICD-10-CM | POA: Insufficient documentation

## 2022-11-10 DIAGNOSIS — C9001 Multiple myeloma in remission: Secondary | ICD-10-CM | POA: Diagnosis not present

## 2022-11-10 DIAGNOSIS — C9000 Multiple myeloma not having achieved remission: Secondary | ICD-10-CM

## 2022-11-10 DIAGNOSIS — R7989 Other specified abnormal findings of blood chemistry: Secondary | ICD-10-CM | POA: Diagnosis not present

## 2022-11-10 LAB — CBC WITH DIFFERENTIAL (CANCER CENTER ONLY)
Abs Immature Granulocytes: 0.05 10*3/uL (ref 0.00–0.07)
Basophils Absolute: 0 10*3/uL (ref 0.0–0.1)
Basophils Relative: 0 %
Eosinophils Absolute: 0.2 10*3/uL (ref 0.0–0.5)
Eosinophils Relative: 3 %
HCT: 28.5 % — ABNORMAL LOW (ref 36.0–46.0)
Hemoglobin: 9.3 g/dL — ABNORMAL LOW (ref 12.0–15.0)
Immature Granulocytes: 1 %
Lymphocytes Relative: 8 %
Lymphs Abs: 0.8 10*3/uL (ref 0.7–4.0)
MCH: 33 pg (ref 26.0–34.0)
MCHC: 32.6 g/dL (ref 30.0–36.0)
MCV: 101.1 fL — ABNORMAL HIGH (ref 80.0–100.0)
Monocytes Absolute: 0.7 10*3/uL (ref 0.1–1.0)
Monocytes Relative: 7 %
Neutro Abs: 8 10*3/uL — ABNORMAL HIGH (ref 1.7–7.7)
Neutrophils Relative %: 81 %
Platelet Count: 222 10*3/uL (ref 150–400)
RBC: 2.82 MIL/uL — ABNORMAL LOW (ref 3.87–5.11)
RDW: 20.1 % — ABNORMAL HIGH (ref 11.5–15.5)
WBC Count: 9.8 10*3/uL (ref 4.0–10.5)
nRBC: 0 % (ref 0.0–0.2)

## 2022-11-10 LAB — CMP (CANCER CENTER ONLY)
ALT: 13 U/L (ref 0–44)
AST: 12 U/L — ABNORMAL LOW (ref 15–41)
Albumin: 4.1 g/dL (ref 3.5–5.0)
Alkaline Phosphatase: 82 U/L (ref 38–126)
Anion gap: 10 (ref 5–15)
BUN: 31 mg/dL — ABNORMAL HIGH (ref 6–20)
CO2: 18 mmol/L — ABNORMAL LOW (ref 22–32)
Calcium: 9.3 mg/dL (ref 8.9–10.3)
Chloride: 110 mmol/L (ref 98–111)
Creatinine: 3.18 mg/dL — ABNORMAL HIGH (ref 0.44–1.00)
GFR, Estimated: 18 mL/min — ABNORMAL LOW (ref >60)
Glucose, Bld: 118 mg/dL — ABNORMAL HIGH (ref 70–99)
Potassium: 3.8 mmol/L (ref 3.5–5.1)
Sodium: 138 mmol/L (ref 135–145)
Total Bilirubin: 0.4 mg/dL (ref 0.3–1.2)
Total Protein: 7.4 g/dL (ref 6.5–8.1)

## 2022-11-10 LAB — MAGNESIUM: Magnesium: 1.6 mg/dL — ABNORMAL LOW (ref 1.7–2.4)

## 2022-11-10 NOTE — Progress Notes (Signed)
HEMATOLOGY/ONCOLOGY CLINIC NOTE  Date of Service: 11/10/22   Patient Care Team: Benita Stabile, MD as PCP - General (Internal Medicine) Gilmer Mor, DO as Consulting Physician (Interventional Radiology)  CHIEF COMPLAINTS/PURPOSE OF CONSULTATION:  Follow-up for continued evaluation management of myeloma  HISTORY OF PRESENTING ILLNESS:  Please see previous note for HPI.  INTERVAL HISTORY:  Stacey Montoya is a 47 y.o. female here for continued valuation and management of multiple myeloma.   Patient was last seen by PA Thayil on 09/16/2022 and she complained of periodic episodes of nausea and diarrhea lasting 3-4 days at a time. Nausea improves with her prescribed antiemetics.   Patient was hospitalized from 10/05/2022 to 10/19/2022 for 3 days of worsening shortness of breath. She was found to have right-sided upper lobe pulmonary consolidation, loculated pleural effusion, and AKI. She was placed on antibiotics and underwent right chest tube placement on 10/06/2022. Patient also had insertion of non-tunneled CVC catheter placement by PCCM on 10/10/2022.  Patient is accompanied by her mother during this visit. She has been doing well overall since she got discharged from the hospital. She is still tired and shortness of breath when climbing stairs. Patient notes that her cough is slowly improving. Her last X-Ray was around one month ago. She is following-up with her Pulmonologist.   She denies any fever, chills, night sweats, unexpected weight loss, chest pain, abdominal pain, or leg swelling. She complains of chronic lower back pain.   Patient has stopped vapping since our last visit, she stopped after getting discharged from the hospital.   MEDICAL HISTORY:  Past Medical History:  Diagnosis Date   Abnormal Pap smear of cervix    Back pain    Cancer (HCC) 04/02/2020   multiple myeloma   Hyperlipidemia    Hypertension     SURGICAL HISTORY: Past Surgical History:   Procedure Laterality Date   CERVICAL CONIZATION W/BX  12/02/2010   Procedure: CONIZATION CERVIX WITH BIOPSY;  Surgeon: Melony Overly;  Location: [facility];  Service: Gynecology;  Laterality: N/A;  COLD KNIFE   CESAREAN SECTION     DILATION AND CURETTAGE OF UTERUS  12/02/2010   Procedure: DILATATION AND CURETTAGE (D&C);  Surgeon: Melony Overly;  Location: [facility];  Service: Gynecology;  Laterality: N/A;   IR RADIOLOGIST EVAL & MGMT  04/16/2020   KYPHOPLASTY Bilateral 09/05/2020   Procedure: KYPHOPLASTY THORACIC TWELVE AND LUMBAR FOUR;  Surgeon: Lisbeth Renshaw, MD;  Location: [facility];  Service: Neurosurgery;  Laterality: Bilateral;   left hand     drain - infection   WISDOM TOOTH EXTRACTION      SOCIAL HISTORY: Social History   Socioeconomic History   Marital status: Married    Spouse name: Not on file   Number of children: Not on file   Years of education: Not on file   Highest education level: Not on file  Occupational History   Not on file  Tobacco Use   Smoking status: Former    Current packs/day: 0.50    Average packs/day: 0.5 packs/day for 20.0 years (10.0 ttl pk-yrs)    Types: Cigarettes   Smokeless tobacco: Never  Vaping Use   Vaping status: Some Days  Substance and Sexual Activity   Alcohol use: Not Currently   Drug use: Not Currently   Sexual activity: Not Currently    Partners: Male    Birth control/protection: Condom  Other Topics Concern   Not on file  Social History Narrative   **  Merged History Encounter **       Social Determinants of Health   Financial Resource Strain: Not on file  Food Insecurity: No Food Insecurity (10/05/2022)   Hunger Vital Sign    Worried About Running Out of Food in the Last Year: Never true    Ran Out of Food in the Last Year: Never true  Transportation Needs: No Transportation Needs (10/05/2022)   PRAPARE - Administrator, Civil Service (Medical): No    Lack of Transportation (Non-Medical): No  Physical Activity:  Not on file  Stress: Not on file  Social Connections: Not on file  Intimate Partner Violence: Not At Risk (10/05/2022)   Humiliation, Afraid, Rape, and Kick questionnaire    Fear of Current or Ex-Partner: No    Emotionally Abused: No    Physically Abused: No    Sexually Abused: No    FAMILY HISTORY: Family History  Problem Relation Age of Onset   Lung cancer Maternal Grandfather    Pancreatic cancer Paternal Grandfather     ALLERGIES:  is allergic to duloxetine hcl and zithromax [azithromycin].  MEDICATIONS:  Current Outpatient Medications  Medication Sig Dispense Refill   acyclovir (ZOVIRAX) 400 MG tablet Take 1 tablet (400 mg total) by mouth 2 (two) times daily. 60 tablet 5   albuterol (VENTOLIN HFA) 108 (90 Base) MCG/ACT inhaler Inhale 1-2 puffs into the lungs every 6 (six) hours as needed for wheezing.     amLODipine (NORVASC) 5 MG tablet Take 1 tablet (5 mg total) by mouth daily. HOLD until follow-up appointment with your PCP.     b complex vitamins capsule Take 1 capsule by mouth daily. 30 capsule    docusate sodium (COLACE) 100 MG capsule Take 1 capsule (100 mg total) by mouth 2 (two) times daily. Also available OTC. 60 capsule 0   ergocalciferol (VITAMIN D2) 1.25 MG (50000 UT) capsule Take 1 capsule (50,000 Units total) by mouth once a week.     fluticasone-salmeterol (WIXELA INHUB) 500-50 MCG/ACT AEPB Inhale 1 puff into the lungs in the morning and at bedtime. 60 each 3   gabapentin (NEURONTIN) 100 MG capsule Take 1 capsule (100 mg total) by mouth 3 (three) times daily. 90 capsule 2   lenalidomide (REVLIMID) 10 MG capsule TAKE 1 CAPSULE BY MOUTH DAILY  FOR 21 DAYS, THEN 7 DAYS OFF (Patient taking differently: Take 10 mg by mouth See admin instructions. TAKE 1 CAPSULE BY MOUTH DAILY  FOR 21 DAYS, THEN 7 DAYS OFF) 21 capsule 0   medroxyPROGESTERone (PROVERA) 10 MG tablet Take 10 mg by mouth daily.     metoprolol tartrate (LOPRESSOR) 50 MG tablet Take 1 tablet (50 mg total) by  mouth 2 (two) times daily. 60 tablet 3   omeprazole (PRILOSEC) 20 MG capsule Take 20 mg by mouth daily.     ondansetron (ZOFRAN) 4 MG tablet Take 2 tablets (8 mg total) by mouth every 8 (eight) hours as needed for nausea or vomiting. 30 tablet 0   oxyCODONE (OXY IR/ROXICODONE) 5 MG immediate release tablet Take 1 tablet (5 mg total) by mouth every 6 (six) hours as needed for moderate pain. for pain 60 tablet 0   polyethylene glycol (MIRALAX / GLYCOLAX) 17 g packet Take 17 g by mouth daily as needed for mild constipation.     pravastatin (PRAVACHOL) 40 MG tablet Take 40 mg by mouth daily.     prochlorperazine (COMPAZINE) 10 MG tablet Take 1 tablet (10 mg total) by mouth  every 6 (six) hours as needed for nausea or vomiting. 90 tablet 2   No current facility-administered medications for this visit.    REVIEW OF SYSTEMS:   . 10 Point review of Systems was done is negative except as noted above.   PHYSICAL EXAMINATION: ECOG PERFORMANCE STATUS: 2 - Symptomatic, <50% confined to bed .BP (!) 107/54   Pulse 82   Temp 97.9 F (36.6 C)   Resp 20   Wt 245 lb 14.4 oz (111.5 kg)   SpO2 98%   BMI 43.22 kg/m  NAD GENERAL:alert, in no acute distress and comfortable SKIN: no acute rashes, no significant lesions EYES: conjunctiva are pink and non-injected, sclera anicteric NECK: supple, no JVD LYMPH:  no palpable lymphadenopathy in the cervical, axillary or inguinal regions LUNGS: clear to auscultation b/l with normal respiratory effort HEART: regular rate & rhythm ABDOMEN:  normoactive bowel sounds , non tender, not distended. Extremity: no pedal edema PSYCH: alert & oriented x 3 with fluent speech NEURO: no focal motor/sensory deficits  Exam performed in chair.  LABORATORY DATA:  I have reviewed the data as listed  .Marland Kitchen    Latest Ref Rng & Units 11/10/2022   10:12 AM 10/19/2022    3:10 AM 10/18/2022    3:02 AM  CBC  WBC 4.0 - 10.5 K/uL 9.8  5.4  5.2   Hemoglobin 12.0 - 15.0 g/dL 9.3  7.9   7.7   Hematocrit 36.0 - 46.0 % 28.5  25.2  24.3   Platelets 150 - 400 K/uL 222  151  140     .    Latest Ref Rng & Units 11/10/2022   10:12 AM 10/19/2022    3:10 AM 10/18/2022    3:02 AM  CMP  Glucose 70 - 99 mg/dL 956  213  086   BUN 6 - 20 mg/dL 31  69  78   Creatinine 0.44 - 1.00 mg/dL 5.78  4.69  6.29   Sodium 135 - 145 mmol/L 138  137  141   Potassium 3.5 - 5.1 mmol/L 3.8  3.8  4.1   Chloride 98 - 111 mmol/L 110  110  109   CO2 22 - 32 mmol/L 18  18  18    Calcium 8.9 - 10.3 mg/dL 9.3  8.5  8.6   Total Protein 6.5 - 8.1 g/dL 7.4     Total Bilirubin 0.3 - 1.2 mg/dL 0.4     Alkaline Phos 38 - 126 U/L 82     AST 15 - 41 U/L 12     ALT 0 - 44 U/L 13       RADIOGRAPHIC STUDIES: I have personally reviewed the radiological images as listed and agreed with the findings in the report. DG Chest 2 View  Result Date: 10/13/2022 CLINICAL DATA:  142230 Pleural effusion 142230. Shortness of breath. EXAM: CHEST - 2 VIEW COMPARISON:  Chest radiograph 10/12/2022. FINDINGS: 0611 hours. Unchanged right IJ approach central venous catheter with tip projecting over the low SVC. Interval removal of the right sided pleural drainage catheter. No residual pneumothorax. Unchanged moderate right pleural effusion with adjacent right basilar atelectasis. IMPRESSION: 1. Interval removal of the right sided pleural drainage catheter. No residual pneumothorax. 2. Unchanged moderate right pleural effusion with adjacent right basilar atelectasis. Electronically Signed   By: Orvan Falconer M.D.   On: 10/13/2022 10:16   DG CHEST PORT 1 VIEW  Result Date: 10/12/2022 CLINICAL DATA:  Pleural effusion with pleural drain in situ EXAM:  PORTABLE CHEST 1 VIEW COMPARISON:  10/10/2022 FINDINGS: Right base collapse/consolidation with effusion is similar to prior. Right pleural drain again noted. No evidence for right-sided pneumothorax. Right IJ central line tip overlies the mid SVC. Left lung clear. The cardiopericardial  silhouette is within normal limits for size. Telemetry leads overlie the chest. IMPRESSION: Stable exam. Right base collapse/consolidation with effusion. No pneumothorax. Electronically Signed   By: Kennith Center M.D.   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  2) h/o hypercalcemia due to multiple myeloma-now resolved with IV fluids, calcitonin, pamidronate. 3) h/o anemia due to multiple myeloma becoming more apparent as the patient's hemoconcentration due to dehydration has been corrected.   4) h/o acute renal failure related to dehydration hypercalcemia and multiple myeloma.  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  PLAN: -Discussed lab results from today, 11/10/2022, in detail with the patient. CBC shows decreased but improved hemoglobin of 9.3 g/dL with hematocrit of 16.1%. CMP shows elevated glucose level at 118, elevated BUN of 31, elevated but improved Creatinine at 3.18, and slightly decreased AST of 12.  -continue to Hold revlimid and hold Zometa infusion due to elevated creatinine and kidney function.  -We will discuss about continuing Revlimid or changing medication during our next visit.  -Recommend influenza vaccine, COVID-19 Booster, and other age related vaccines.   FOLLOW UP: RTC with Dr Candise Che with labs in 6-7  weeks  The total time spent in the appointment was 30 minutes* .  All of the patient's questions were answered with apparent satisfaction. The patient knows to call the clinic with any problems, questions or concerns.   Wyvonnia Lora MD MS AAHIVMS [facility] Hematology/Oncology Physician [facility]  .*Total Encounter Time as defined by the Centers for Medicare and Medicaid Services includes, in addition to the face-to-face time of a patient visit (documented in the note above) non-face-to-face time: obtaining and reviewing outside history, ordering and reviewing medications, tests or procedures, care coordination (communications with other health care professionals or caregivers) and documentation in the medical record.   I,Param Shah,acting as a Neurosurgeon for Wyvonnia Lora, MD.,have documented all relevant documentation on the behalf of Wyvonnia Lora, MD,as directed by  Wyvonnia Lora, MD while in the presence of Wyvonnia Lora, MD.   .I have reviewed the above documentation for accuracy and completeness, and I agree with the above. Johney Maine MD

## 2022-11-11 ENCOUNTER — Encounter: Payer: Self-pay | Admitting: Hematology

## 2022-11-11 NOTE — Progress Notes (Unsigned)
Stacey Montoya, female    DOB: Jun 30, 1975    MRN: [id number]   Brief patient profile:  47  yowf  on disability for MM  quit cig smoking 2016 then vaped until admitted  referred to pulmonary clinic in [facility]  11/12/2022 by [facility] hospitalists for complex parapneumonic effusion  Sept 2022 s/p autologous BM  transplant for MM   Admit date:     10/05/2022  Discharge date: 10/19/22     Discharge Diagnoses:  Acute respiratory failure with hypoxia (HCC) Community-acquired pneumonia Right-sided loculated parapneumonic effusion Acute kidney injury on CKD stage IIIa None anion gap metabolic acidosis Normocytic anemia Leukopenia, thrombocytopenia   Multiple myeloma (HCC) Hyponatremia Hypocalcemia     Obesity, Class III, BMI 40-49.9 (morbid obesity) (HCC)   Thrombocytopenia [facility])     Hospital Course:   Patient is a 47 year old female with HTN, HLP, multiple myeloma s/p chemotherapy, now in remission, DVT on Eliquis presented to ED with 3 days of worsening shortness of breath. She was found to have right-sided upper lobe pulmonary consolidation, loculated pleural effusion, AKI. She was placed on antibiotics, underwent right chest tube placement on 10/06/2022. Nephrology, cardiothoracic surgery also consulted. She had insertion of nontunneled CVC catheter placement by PCCM on 8/24, HD started on 10/10/2022.      Assessment and Plan:    Acute respiratory failure with hypoxia (HCC) Community-acquired pneumonia, right-sided with loculated parapneumonic effusion -chest tube was placed and removed on 10/12/2022.  Patient received pleural fibrinolytics as per pulmonology -Completed doxycycline, currently on Rocephin -Transitioned to oral Augmentin for 2 more days to complete full course of 14 days. -Continue albuterol inhaler as needed, placed on fluticasone/ salmeterol ([place]) inhaler twice daily -O2 sats improved, 100% on room air     AKI (acute kidney injury) (HCC) on CKD stage IIIa,  non-anion gap metabolic acidosis -Nephrology consulted, likely ATN due to Toradol and fluid shift with draining pleural effusion, hemodynamic changes. -Renal ultrasound unremarkable. -Right IJ temp HD catheter placed on 8/24 by PCCM, started on HD on 8/24 -Received second HD on 8/26. -Urine output stable and improving, CLIP process was placed on hold as patient started having renal recovery -Creatinine plateaued at 7.88, Creatinine 7.2-> 7.85-> 7.88-> 7.3-> 6.5 -> 5.9 upon discharge HD catheter removed, cleared for discharge by nephrology     Normocytic anemia, anemia of chronic disease -From renal failure, multiple myeloma, chronic illness -Transfused 1 unit packed RBCs on 8/25, 8/27 -Hb 7.9 at discharge     Multiple myeloma (HCC) -Revlimid on hold until reevaluation by Dr. Candise Che as outpatient -Patient will continue to hold Eliquis until renal function improved and cleared to resume by oncology and nephrology.  Patient reports that she takes eliquis for DVT prophylaxis with chemotherapy.   Leukopenia, -Resolved   Thrombocytopenia (HCC) -Resolved   Hyponatremia -Resolved 137 at discharge   Hypocalcemia -Albumin on 8/24 1.8 -Improving     Obesity, Class III, BMI 40-49.9 (morbid obesity) (HCC) Estimated body mass index is 44.8 kg/m as calculated from the following:   Height as of this encounter: 5' 3.25" (1.607 m).   Weight as of this encounter: 115.6 kg.     W/in a week of d/c 10/19/22 cc R post cp and  took doxy x 7 days per Dr Scharlene Gloss > pain resolved   History of Present Illness  11/12/2022  Pulmonary/ 1st office eval/ [name] / [place] [facility]  No chief complaint on file. Dyspnea:  limited by back hips from MM x 2 years  no recent change  Does have treadmill  Cough: none  Sleep: < 30 degrees with legs 10 degrees with back / one pillow  SABA use: not using  02: none     No obvious day to day or daytime pattern/variability or assoc excess/ purulent sputum or mucus plugs or  hemoptysis or cp or chest tightness, subjective wheeze or overt sinus or hb symptoms.    Also denies any obvious fluctuation of symptoms with weather or environmental changes or other aggravating or alleviating factors except as outlined above   No unusual exposure hx or h/o childhood pna/ asthma or knowledge of premature birth.  Current Allergies, Complete Past Medical History, Past Surgical History, Family History, and Social History were reviewed in [name].  ROS  The following are not active complaints unless bolded Hoarseness, sore throat, dysphagia, dental problems, itching, sneezing,  nasal congestion or discharge of excess mucus or purulent secretions, ear ache,   fever, chills, sweats, unintended wt loss or wt gain, classically pleuritic or exertional cp,  orthopnea pnd or arm/hand swelling  or leg swelling, presyncope, palpitations, abdominal pain, anorexia, nausea, vomiting, diarrhea  or change in bowel habits or change in bladder habits, change in stools or change in urine, dysuria, hematuria,  rash, arthralgias, visual complaints, headache, numbness, weakness or ataxia or problems with walking or coordination,  change in mood or  memory.            Outpatient Medications Prior to Visit  Medication Sig Dispense Refill   acyclovir (ZOVIRAX) 400 MG tablet Take 1 tablet (400 mg total) by mouth 2 (two) times daily. 60 tablet 5   albuterol (VENTOLIN HFA) 108 (90 Base) MCG/ACT inhaler Inhale 1-2 puffs into the lungs every 6 (six) hours as needed for wheezing.     amLODipine (NORVASC) 5 MG tablet Take 1 tablet (5 mg total) by mouth daily. HOLD until follow-up appointment with your PCP.     b complex vitamins capsule Take 1 capsule by mouth daily. 30 capsule    docusate sodium (COLACE) 100 MG capsule Take 1 capsule (100 mg total) by mouth 2 (two) times daily. Also available OTC. 60 capsule 0   ergocalciferol (VITAMIN D2) 1.25 MG (50000 UT) capsule Take 1  capsule (50,000 Units total) by mouth once a week.     fluticasone-salmeterol (WIXELA INHUB) 500-50 MCG/ACT AEPB  Not using at all   3   gabapentin (NEURONTIN) 100 MG capsule Take 1 capsule (100 mg total) by mouth 3 (three) times daily. 90 capsule 2   lenalidomide (REVLIMID) 10 MG capsule TAKE 1 CAPSULE BY MOUTH DAILY  FOR 21 DAYS, THEN 7 DAYS OFF (Patient taking differently: Take 10 mg by mouth See admin instructions. TAKE 1 CAPSULE BY MOUTH DAILY  FOR 21 DAYS, THEN 7 DAYS OFF) 21 capsule 0   medroxyPROGESTERone (PROVERA) 10 MG tablet Take 10 mg by mouth daily.     metoprolol tartrate (LOPRESSOR) 50 MG tablet Take 1 tablet (50 mg total) by mouth 2 (two) times daily. 60 tablet 3   omeprazole (PRILOSEC) 20 MG capsule Take 20 mg by mouth daily.     ondansetron (ZOFRAN) 4 MG tablet Take 2 tablets (8 mg total) by mouth every 8 (eight) hours as needed for nausea or vomiting. 30 tablet 0   oxyCODONE (OXY IR/ROXICODONE) 5 MG immediate release tablet Take 1 tablet (5 mg total) by mouth every 6 (six) hours as needed for moderate pain. for pain 60 tablet 0  polyethylene glycol (MIRALAX / GLYCOLAX) 17 g packet Take 17 g by mouth daily as needed for mild constipation.     pravastatin (PRAVACHOL) 40 MG tablet Take 40 mg by mouth daily.     prochlorperazine (COMPAZINE) 10 MG tablet Take 1 tablet (10 mg total) by mouth every 6 (six) hours as needed for nausea or vomiting. 90 tablet 2   No facility-administered medications prior to visit.    Past Medical History:  Diagnosis Date   Abnormal Pap smear of cervix    Back pain    Cancer (HCC) 04/02/2020   multiple myeloma   Hyperlipidemia    Hypertension       Objective:     BP 115/76   Pulse (!) 101   Ht 5\' 3"  (1.6 m)   Wt 247 lb 6.4 oz (112.2 kg)   BMI 43.82 kg/m   Vital signs reviewed  11/12/2022  - Note at rest 02 sats  96% on RA   General appearance:    MO (by BMI) amb wf nad    HEENT : Oropharynx  clear/ dentition ok      Nasal turbinates  nl    NECK :  without  apparent JVD/ palpable Nodes/TM    LUNGS: no acc muscle use,  Nl contour chest with decreased bs/ dullness at R base    CV:  RRR  no s3 or murmur or increase in P2, and no edema   ABD:  mod obese soft and nontender    MS:  Nl gait/ ext warm without deformities Or obvious joint restrictions  calf tenderness, cyanosis or clubbing    SKIN: warm and dry without lesions    NEURO:  alert, approp, nl sensorium with  no motor or cerebellar deficits apparent.      CXR PA and Lateral:   11/12/2022 :    I personally reviewed images and impression is as follows:     Improved aeration both bases with persistent mod pleural effusion/scar     Assessment   Pleural effusion on right c/w complex paraponeumonic In setting of CAP/NOS 09/2022 and rx with lytics/ chest tube   She is clincally doing great but has extensive scarring R base and ? Still some loculated effusion so may not be in the clear yet  Rec IS/ mobilize/ no further abx for now  Full vaccines needed > to work out with Dr Scharlene Gloss office if possible   F/u with cxr in 6 weeks    Acute respiratory failure with hypoxia (HCC) 11/12/2022   Walked on RA  x  3  lap(s) =  approx 450  ft  @ mod pace, stopped due to back pain, min sob  with lowest 02 sats 93%    Obesity, Class III, BMI 40-49.9 (morbid obesity) (HCC) Body mass index is 43.82 kg/m.    Lab Results  Component Value Date   TSH 2.004 04/02/2020      Contributing to doe and risk of GERD/dvt/ PE  >>>   reviewed the need and the process to achieve and maintain neg calorie balance > defer f/u primary care including intermittently monitoring thyroid status    Each maintenance medication was reviewed in detail including emphasizing most importantly the difference between maintenance and prns and under what circumstances the prns are to be triggered using an action plan format where appropriate.  Total time for H and P, chart review, counseling,  directly  observing portions of ambulatory 02 saturation study/ and generating customized  AVS unique to this office visit / same day charting > 40 min post hosp f/u with pt new to me                    Sandrea Hughs, MD 11/12/2022

## 2022-11-12 ENCOUNTER — Ambulatory Visit (HOSPITAL_COMMUNITY)
Admission: RE | Admit: 2022-11-12 | Discharge: 2022-11-12 | Disposition: A | Discharge status: Home / Self Care | Payer: 59 | Source: Ambulatory Visit | Attending: Internal Medicine | Admitting: Internal Medicine

## 2022-11-12 ENCOUNTER — Encounter: Payer: Self-pay | Admitting: Internal Medicine

## 2022-11-12 ENCOUNTER — Ambulatory Visit (INDEPENDENT_AMBULATORY_CARE_PROVIDER_SITE_OTHER): Payer: 59 | Admitting: Internal Medicine

## 2022-11-12 VITALS — BP 115/76 | HR 101 | Ht 63.0 in | Wt 247.4 lb

## 2022-11-12 DIAGNOSIS — J90 Pleural effusion, not elsewhere classified: Secondary | ICD-10-CM | POA: Insufficient documentation

## 2022-11-12 DIAGNOSIS — J9601 Acute respiratory failure with hypoxia: Secondary | ICD-10-CM

## 2022-11-12 NOTE — Patient Instructions (Addendum)
Incentive spirometer as much you can   To get the most out of exercise, you need to be continuously aware that you are short of breath, but never out of breath, gradually building up to a minimum of 30 minutes daily. As you improve, it will actually be easier for you to do the same amount of exercise  in  30 minutes so always push to the level where you are short of breath.  Once you can do this, push for longer duration or repeat it after at least 4 hours of rest.  Make sure you check your oxygen saturations at highest level of activity - you have collect the dots to connect the dots   Please remember to go to the  x-ray department  @  [facility] for your tests - we will call you with the results when they are available     I do recommend a RSV and the new Pneumonia shot (defer to Dr Scharlene Gloss office)   Please schedule a follow up office visit in 6 weeks, call sooner if needed

## 2022-11-12 NOTE — Assessment & Plan Note (Signed)
Body mass index is 43.82 kg/m.    Lab Results  Component Value Date   TSH 2.004 04/02/2020      Contributing to doe and risk of GERD/dvt/ PE  >>>   reviewed the need and the process to achieve and maintain neg calorie balance > defer f/u primary care including intermittently monitoring thyroid status    Each maintenance medication was reviewed in detail including emphasizing most importantly the difference between maintenance and prns and under what circumstances the prns are to be triggered using an action plan format where appropriate.  Total time for H and P, chart review, counseling,  directly observing portions of ambulatory 02 saturation study/ and generating customized AVS unique to this office visit / same day charting > 40 min post hosp f/u with pt new to me

## 2022-11-12 NOTE — Assessment & Plan Note (Signed)
11/12/2022   Walked on RA  x  3  lap(s) =  approx 450  ft  @ mod pace, stopped due to back pain, min sob  with lowest 02 sats 93%

## 2022-11-12 NOTE — Assessment & Plan Note (Signed)
In setting of CAP/NOS 09/2022 and rx with lytics/ chest tube   She is clincally doing great but has extensive scarring R base and ? Still some loculated effusion so may not be in the clear yet  Rec IS/ mobilize/ no further abx for now  Full vaccines needed > to work out with Dr Scharlene Gloss office if possible   F/u with cxr in 6 weeks

## 2022-11-13 LAB — MULTIPLE MYELOMA PANEL, SERUM
Albumin SerPl Elph-Mcnc: 3.6 g/dL (ref 2.9–4.4)
Albumin/Glob SerPl: 1.1 (ref 0.7–1.7)
Alpha 1: 0.3 g/dL (ref 0.0–0.4)
Alpha2 Glob SerPl Elph-Mcnc: 1 g/dL (ref 0.4–1.0)
B-Globulin SerPl Elph-Mcnc: 1.3 g/dL (ref 0.7–1.3)
Gamma Glob SerPl Elph-Mcnc: 0.8 g/dL (ref 0.4–1.8)
Globulin, Total: 3.4 g/dL (ref 2.2–3.9)
IgA: 358 mg/dL — ABNORMAL HIGH (ref 87–352)
IgG (Immunoglobin G), Serum: 897 mg/dL (ref 586–1602)
IgM (Immunoglobulin M), Srm: 23 mg/dL — ABNORMAL LOW (ref 26–217)
Total Protein ELP: 7 g/dL (ref 6.0–8.5)

## 2022-11-14 ENCOUNTER — Other Ambulatory Visit: Payer: Self-pay

## 2022-11-17 ENCOUNTER — Encounter: Payer: Self-pay | Admitting: Hematology

## 2022-11-19 ENCOUNTER — Encounter: Payer: Self-pay | Admitting: Nephrology

## 2022-11-23 ENCOUNTER — Other Ambulatory Visit: Payer: Self-pay | Admitting: Hematology

## 2022-11-23 DIAGNOSIS — C9001 Multiple myeloma in remission: Secondary | ICD-10-CM

## 2022-11-24 ENCOUNTER — Encounter: Payer: Self-pay | Admitting: Hematology

## 2022-11-30 ENCOUNTER — Institutional Professional Consult (permissible substitution): Payer: Medicare Other | Admitting: Pulmonary Disease

## 2022-12-11 ENCOUNTER — Other Ambulatory Visit: Payer: Self-pay | Admitting: Hematology

## 2022-12-11 DIAGNOSIS — C9001 Multiple myeloma in remission: Secondary | ICD-10-CM

## 2022-12-14 ENCOUNTER — Other Ambulatory Visit: Payer: Self-pay

## 2022-12-15 ENCOUNTER — Encounter: Payer: Self-pay | Admitting: Hematology

## 2022-12-20 ENCOUNTER — Other Ambulatory Visit: Payer: Self-pay

## 2022-12-24 ENCOUNTER — Ambulatory Visit (INDEPENDENT_AMBULATORY_CARE_PROVIDER_SITE_OTHER): Payer: 59 | Admitting: Internal Medicine

## 2022-12-24 ENCOUNTER — Encounter: Payer: Self-pay | Admitting: Internal Medicine

## 2022-12-24 VITALS — BP 127/76 | HR 113 | Ht 63.0 in | Wt 252.0 lb

## 2022-12-24 DIAGNOSIS — J9601 Acute respiratory failure with hypoxia: Secondary | ICD-10-CM | POA: Diagnosis not present

## 2022-12-24 NOTE — Progress Notes (Signed)
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   Admit date:     10/05/2022  Discharge date: 10/19/22     Discharge Diagnoses:  Acute respiratory failure with hypoxia (HCC) Community-acquired pneumonia Right-sided loculated parapneumonic effusion Acute kidney injury on CKD stage IIIa None anion gap metabolic acidosis Normocytic anemia Leukopenia, thrombocytopenia   Multiple myeloma (HCC) Hyponatremia Hypocalcemia     Obesity, Class III, BMI 40-49.9 (morbid obesity) (HCC)   Thrombocytopenia [name])     Hospital Course:   Patient is a 47 year old female with HTN, HLP, multiple myeloma s/p chemotherapy, now in remission, DVT on Eliquis presented to ED with 3 days of worsening shortness of breath. She was found to have right-sided upper lobe pulmonary consolidation, loculated pleural effusion, AKI. She was placed on antibiotics, underwent right chest tube placement on 10/06/2022. Nephrology, cardiothoracic surgery also consulted. She had insertion of nontunneled CVC catheter placement by PCCM on 8/24, HD started on 10/10/2022.      Assessment and Plan:    Acute respiratory failure with hypoxia (HCC) Community-acquired pneumonia, right-sided with loculated parapneumonic effusion -chest tube was placed and removed on 10/12/2022.  Patient received pleural fibrinolytics as per pulmonology -Completed doxycycline, currently on Rocephin -Transitioned to oral Augmentin for 2 more days to complete full course of 14 days. -Continue albuterol inhaler as needed, placed on fluticasone/ salmeterol ([place]) inhaler twice daily -O2 sats improved, 100% on room air     AKI (acute kidney injury) (HCC)  on CKD stage IIIa, non-anion gap metabolic acidosis -Nephrology consulted, likely ATN due to Toradol and fluid shift with draining pleural effusion, hemodynamic changes. -Renal ultrasound unremarkable. -Right IJ temp HD catheter placed on 8/24 by PCCM, started on HD on 8/24 -Received second HD on 8/26. -Urine output stable and improving, CLIP process was placed on hold as patient started having renal recovery -Creatinine plateaued at 7.88, Creatinine 7.2-> 7.85-> 7.88-> 7.3-> 6.5 -> 5.9 upon discharge HD catheter removed, cleared for discharge by nephrology     Normocytic anemia, anemia of chronic disease -From renal failure, multiple myeloma, chronic illness -Transfused 1 unit packed RBCs on 8/25, 8/27 -Hb 7.9 at discharge     Multiple myeloma (HCC) -Revlimid on hold until reevaluation by Stacey. Candise Montoya as outpatient -Patient will continue to hold Eliquis until renal function improved and cleared to resume by oncology and nephrology.  Patient reports that she takes eliquis for DVT prophylaxis with chemotherapy.   Leukopenia, -Resolved   Thrombocytopenia (HCC) -Resolved   Hyponatremia -Resolved 137 at discharge   Hypocalcemia -Albumin on 8/24 1.8 -Improving     Obesity, Class III, BMI 40-49.9 (morbid obesity) (HCC) Estimated body mass index is 44.8 kg/m as calculated from the following:   Height as of this encounter: 5' 3.25" (1.607 m).   Weight as of this encounter: 115.6 kg.    W/in a week of d/c 10/19/22 cc R post cp and  took doxy x 7 days per Stacey Montoya > pain resolved     History of Present Illness  11/12/2022  Pulmonary/ 1st office eval/ Stacey Montoya / [name]  No chief complaint on file. Dyspnea:  limited by back hips from  MM x 2 years no recent change  Does have treadmill  Cough: none  Sleep: < 30 degrees with legs 10 degrees with back / one pillow  SABA use: not using  02: none  Rec Incentive spirometer as much you can  To get the most out of exercise, you need  to be continuously aware that you are short of breath, but never out of breath, gradually building up to a minimum of 30 minutes daily.  Make sure you check your oxygen saturations at highest level of activity - you have collect the dots to connect the dots  I do recommend a RSV and the new Pneumonia shot (defer to Stacey Montoya office)   Please schedule a follow up office visit in 6 weeks, call sooner if needed     12/24/2022  f/u ov/[name]/Stacey Montoya re: f/u acute resp failure  maint on no resp rx   Chief Complaint  Patient presents with   Shortness of Breath   Dyspnea:  treadmill x 5 min/ can do grocery store / does hc parking / no problem pushing cart  - sats never less 97%  Cough: none  Sleeping: 10 degrees s  resp cc  SABA use: none 02: none      No obvious day to day or daytime variability or assoc excess/ purulent sputum or mucus plugs or hemoptysis or cp or chest tightness, subjective wheeze or overt sinus or hb symptoms.    Also denies any obvious fluctuation of symptoms with weather or environmental changes or other aggravating or alleviating factors except as outlined above   No unusual exposure hx or h/o childhood pna/ asthma or knowledge of premature birth.  Current Allergies, Complete Past Medical History, Past Surgical History, Family History, and Social History were reviewed in [name].  ROS  The following are not active complaints unless bolded Hoarseness, sore throat, dysphagia, dental problems, itching, sneezing,  nasal congestion or discharge of excess mucus or purulent secretions, ear ache,   fever, chills, sweats, unintended wt loss or wt gain, classically pleuritic or exertional cp,  orthopnea pnd or arm/hand swelling  or leg swelling, presyncope, palpitations, abdominal pain, anorexia, nausea, vomiting, diarrhea  or change in bowel habits or change in bladder habits, change in stools or change in urine, dysuria, hematuria,  rash,  arthralgias, visual complaints, headache, numbness, weakness or ataxia or problems with walking or coordination,  change in mood or  memory.        Current Meds  Medication Sig   acyclovir (ZOVIRAX) 400 MG tablet Take 1 tablet (400 mg total) by mouth 2 (two) times daily.   albuterol (VENTOLIN HFA) 108 (90 Base) MCG/ACT inhaler Inhale 1-2 puffs into the lungs every 6 (six) hours as needed for wheezing.   b complex vitamins capsule Take 1 capsule by mouth daily.   docusate sodium (COLACE) 100 MG capsule Take 1 capsule (100 mg total) by mouth 2 (two) times daily. Also available OTC.   ergocalciferol (VITAMIN D2) 1.25 MG (50000 UT) capsule Take 1 capsule (50,000 Units total) by mouth once a week.   gabapentin (NEURONTIN) 100 MG capsule Take 1 capsule (100 mg total) by mouth 3 (three) times daily.   lenalidomide (REVLIMID) 10 MG capsule TAKE 1 CAPSULE BY MOUTH DAILY  FOR 21 DAYS, THEN 7 DAYS OFF (Patient taking differently: Take 10 mg by mouth See admin instructions. TAKE 1 CAPSULE BY MOUTH DAILY  FOR 21 DAYS, THEN 7 DAYS OFF)  medroxyPROGESTERone (PROVERA) 10 MG tablet Take 10 mg by mouth daily.   Melatonin 10 MG TABS Take 20 mg by mouth at bedtime.   omeprazole (PRILOSEC) 20 MG capsule Take 20 mg by mouth daily.   ondansetron (ZOFRAN) 4 MG tablet Take 2 tablets (8 mg total) by mouth every 8 (eight) hours as needed for nausea or vomiting.   oxyCODONE (OXY IR/ROXICODONE) 5 MG immediate release tablet Take 1 tablet (5 mg total) by mouth every 6 (six) hours as needed for severe pain (pain score 7-10).   polyethylene glycol (MIRALAX / GLYCOLAX) 17 g packet Take 17 g by mouth daily as needed for mild constipation.   pravastatin (PRAVACHOL) 40 MG tablet Take 40 mg by mouth daily.   prochlorperazine (COMPAZINE) 10 MG tablet Take 1 tablet (10 mg total) by mouth every 6 (six) hours as needed for nausea or vomiting.   [DISCONTINUED] fluticasone-salmeterol (WIXELA INHUB) 500-50 MCG/ACT AEPB Inhale 1 puff into  the lungs in the morning and at bedtime.            Past Medical History:  Diagnosis Date   Abnormal Pap smear of cervix    Back pain    Cancer (HCC) 04/02/2020   multiple myeloma   Hyperlipidemia    Hypertension       Objective:    wts   12/24/2022        252  11/12/22 247 lb 6.4 oz (112.2 kg)  11/10/22 245 lb 14.4 oz (111.5 kg)  10/19/22 249 lb 12.5 oz (113.3 kg)      Vital signs reviewed  12/24/2022  - Note at rest 02 sats  94% on RA   General appearance:    pleasant MO (by BMI) amb  wf nad      HEENT : Oropharynx  clear     Nasal turbinates nl    NECK :  without  apparent JVD/ palpable Nodes/TM    LUNGS: no acc muscle use,  Nl contour chest which is clear to A and P bilaterally without cough on insp or exp maneuvers   CV:  RRR  no s3 or murmur or increase in P2, and no edema   ABD: obese  soft and nontender with nl inspiratory excursion in the supine position. No bruits or organomegaly appreciated   MS:  Nl gait/ ext warm without deformities Or obvious joint restrictions  calf tenderness, cyanosis or clubbing    SKIN: warm and dry without lesions    NEURO:  alert, approp, nl sensorium with  no motor or cerebellar deficits apparent.       Assessment

## 2022-12-24 NOTE — Assessment & Plan Note (Addendum)
12/24/2022   Walked on RA  x  3  lap(s) =  approx 450  ft  @ fast pace, stopped due to end of study with lowest 02 sats 97% and no sob    >>>> No further w/u or rx needed - ok to continue off advair and pulmonary f/u is prn

## 2022-12-24 NOTE — Patient Instructions (Signed)
Ok to leave off Advair and just use the albuterol as needed  Follow up here is as needed

## 2022-12-24 NOTE — Assessment & Plan Note (Addendum)
Body mass index is 44.64 kg/m.  -  trending up still Lab Results  Component Value Date   TSH 2.004 04/02/2020    Contributing to doe and risk of GERD/dvt/ PE  >>>   reviewed the need and the process to achieve and maintain neg calorie balance > defer f/u primary care including intermittently monitoring thyroid status     Advised continue sub max ex/ monitor sats and f/u here prn  Each maintenance medication was reviewed in detail including emphasizing most importantly the difference between maintenance and prns and under what circumstances the prns are to be triggered using an action plan format where appropriate.  Total time for H and P, chart review, counseling, reviewing dpi/hfa device(s) , directly observing portions of ambulatory 02 saturation study/ and generating customized AVS unique to this office visit / same day charting = 25 min summary final f/u ov

## 2022-12-31 ENCOUNTER — Other Ambulatory Visit: Payer: Self-pay | Admitting: Hematology

## 2022-12-31 DIAGNOSIS — C9001 Multiple myeloma in remission: Secondary | ICD-10-CM

## 2023-01-01 ENCOUNTER — Encounter: Payer: Self-pay | Admitting: Hematology

## 2023-01-04 ENCOUNTER — Other Ambulatory Visit: Payer: Self-pay

## 2023-01-04 DIAGNOSIS — C9001 Multiple myeloma in remission: Secondary | ICD-10-CM

## 2023-01-05 ENCOUNTER — Inpatient Hospital Stay: Payer: 59

## 2023-01-05 ENCOUNTER — Inpatient Hospital Stay (HOSPITAL_BASED_OUTPATIENT_CLINIC_OR_DEPARTMENT_OTHER): Payer: 59 | Admitting: Hematology

## 2023-01-05 ENCOUNTER — Inpatient Hospital Stay: Payer: 59 | Attending: Hematology

## 2023-01-05 VITALS — BP 125/59 | HR 88 | Temp 97.9°F | Resp 14 | Ht 63.0 in | Wt 255.9 lb

## 2023-01-05 DIAGNOSIS — C9001 Multiple myeloma in remission: Secondary | ICD-10-CM | POA: Diagnosis not present

## 2023-01-05 DIAGNOSIS — Z800 Family history of malignant neoplasm of digestive organs: Secondary | ICD-10-CM | POA: Diagnosis not present

## 2023-01-05 DIAGNOSIS — Z87891 Personal history of nicotine dependence: Secondary | ICD-10-CM | POA: Diagnosis not present

## 2023-01-05 DIAGNOSIS — C9000 Multiple myeloma not having achieved remission: Secondary | ICD-10-CM | POA: Diagnosis present

## 2023-01-05 DIAGNOSIS — G629 Polyneuropathy, unspecified: Secondary | ICD-10-CM | POA: Insufficient documentation

## 2023-01-05 DIAGNOSIS — Z801 Family history of malignant neoplasm of trachea, bronchus and lung: Secondary | ICD-10-CM | POA: Diagnosis not present

## 2023-01-05 DIAGNOSIS — Z79899 Other long term (current) drug therapy: Secondary | ICD-10-CM | POA: Diagnosis not present

## 2023-01-05 LAB — CMP (CANCER CENTER ONLY)
ALT: 35 U/L (ref 0–44)
AST: 21 U/L (ref 15–41)
Albumin: 4.2 g/dL (ref 3.5–5.0)
Alkaline Phosphatase: 74 U/L (ref 38–126)
Anion gap: 9 (ref 5–15)
BUN: 33 mg/dL — ABNORMAL HIGH (ref 6–20)
CO2: 24 mmol/L (ref 22–32)
Calcium: 9.9 mg/dL (ref 8.9–10.3)
Chloride: 105 mmol/L (ref 98–111)
Creatinine: 2.28 mg/dL — ABNORMAL HIGH (ref 0.44–1.00)
GFR, Estimated: 26 mL/min — ABNORMAL LOW (ref >60)
Glucose, Bld: 113 mg/dL — ABNORMAL HIGH (ref 70–99)
Potassium: 3.7 mmol/L (ref 3.5–5.1)
Sodium: 138 mmol/L (ref 135–145)
Total Bilirubin: 0.5 mg/dL (ref <1.2)
Total Protein: 7.5 g/dL (ref 6.5–8.1)

## 2023-01-05 LAB — CBC WITH DIFFERENTIAL (CANCER CENTER ONLY)
Abs Immature Granulocytes: 0.03 10*3/uL (ref 0.00–0.07)
Basophils Absolute: 0 10*3/uL (ref 0.0–0.1)
Basophils Relative: 0 %
Eosinophils Absolute: 0.2 10*3/uL (ref 0.0–0.5)
Eosinophils Relative: 2 %
HCT: 30.7 % — ABNORMAL LOW (ref 36.0–46.0)
Hemoglobin: 10.3 g/dL — ABNORMAL LOW (ref 12.0–15.0)
Immature Granulocytes: 0 %
Lymphocytes Relative: 8 %
Lymphs Abs: 0.8 10*3/uL (ref 0.7–4.0)
MCH: 35.8 pg — ABNORMAL HIGH (ref 26.0–34.0)
MCHC: 33.6 g/dL (ref 30.0–36.0)
MCV: 106.6 fL — ABNORMAL HIGH (ref 80.0–100.0)
Monocytes Absolute: 0.6 10*3/uL (ref 0.1–1.0)
Monocytes Relative: 6 %
Neutro Abs: 8.7 10*3/uL — ABNORMAL HIGH (ref 1.7–7.7)
Neutrophils Relative %: 84 %
Platelet Count: 230 10*3/uL (ref 150–400)
RBC: 2.88 MIL/uL — ABNORMAL LOW (ref 3.87–5.11)
RDW: 16.4 % — ABNORMAL HIGH (ref 11.5–15.5)
WBC Count: 10.3 10*3/uL (ref 4.0–10.5)
nRBC: 0 % (ref 0.0–0.2)

## 2023-01-05 LAB — MAGNESIUM: Magnesium: 1.7 mg/dL (ref 1.7–2.4)

## 2023-01-05 MED ORDER — GABAPENTIN 100 MG PO CAPS: ORAL_CAPSULE | ORAL | 2 refills | Status: DC

## 2023-01-05 NOTE — Progress Notes (Signed)
HEMATOLOGY/ONCOLOGY CLINIC NOTE  Date of Service: 01/05/23   Patient Care Team: Benita Stabile, MD as PCP - General (Internal Medicine) Gilmer Mor, DO as Consulting Physician (Interventional Radiology)  CHIEF COMPLAINTS/PURPOSE OF CONSULTATION:  Follow-up for continued evaluation management of myeloma  HISTORY OF PRESENTING ILLNESS:  Please see previous note for HPI.  INTERVAL HISTORY:  Stacey Montoya is a 47 y.o. female here for continued valuation and management of multiple myeloma.   Patient was last seen by me on 11/10/2022 and she complained of SOB when climbing stairs, lethargy, cough, and chronic lower back pain.   Patient is accompanied by her mother during this visit. Patient notes she was doing well overall since our last visit.   She complains of increased bilateral leg neuropathy, worse at night. She currently takes Gabapentin 100 mg three times a day. She used to only take 900 mg at night.   She notes that her breathing has improved since our last visit. However, she complains of mild SOB when climbing stairs or exercising. She has been discharged from Pulmonologist.   She denies any new infection issues, fever, chills, night sweats, bone pain, chest pain, back pain, abdominal pain, or leg swelling. She does complain of chronic back pain,  Patient has been wearing compression socks, which has improved her leg swelling.   Patient is UTD for influenza vaccine, COVID-19 Booster, and other age related vaccines. She has not received RSV vaccine.   MEDICAL HISTORY:  Past Medical History:  Diagnosis Date   Abnormal Pap smear of cervix    Back pain    Cancer (HCC) 04/02/2020   multiple myeloma   Hyperlipidemia    Hypertension     SURGICAL HISTORY: Past Surgical History:  Procedure Laterality Date   CERVICAL CONIZATION W/BX  12/02/2010   Procedure: CONIZATION CERVIX WITH BIOPSY;  Surgeon: Melony Overly;  Location: [facility];  Service: Gynecology;  Laterality:  N/A;  COLD KNIFE   CESAREAN SECTION     DILATION AND CURETTAGE OF UTERUS  12/02/2010   Procedure: DILATATION AND CURETTAGE (D&C);  Surgeon: Melony Overly;  Location: [facility];  Service: Gynecology;  Laterality: N/A;   IR RADIOLOGIST EVAL & MGMT  04/16/2020   KYPHOPLASTY Bilateral 09/05/2020   Procedure: KYPHOPLASTY THORACIC TWELVE AND LUMBAR FOUR;  Surgeon: Lisbeth Renshaw, MD;  Location: [facility];  Service: Neurosurgery;  Laterality: Bilateral;   left hand     drain - infection   WISDOM TOOTH EXTRACTION      SOCIAL HISTORY: Social History   Socioeconomic History   Marital status: Married    Spouse name: Not on file   Number of children: Not on file   Years of education: Not on file   Highest education level: Not on file  Occupational History   Not on file  Tobacco Use   Smoking status: Former    Current packs/day: 0.50    Average packs/day: 0.5 packs/day for 20.0 years (10.0 ttl pk-yrs)    Types: Cigarettes   Smokeless tobacco: Never  Vaping Use   Vaping status: Some Days  Substance and Sexual Activity   Alcohol use: Not Currently   Drug use: Not Currently   Sexual activity: Not Currently    Partners: Male    Birth control/protection: Condom  Other Topics Concern   Not on file  Social History Narrative   ** Merged History Encounter **       Social Determinants of Corporate investment banker  Strain: Not on file  Food Insecurity: No Food Insecurity (10/05/2022)   Hunger Vital Sign    Worried About Running Out of Food in the Last Year: Never true    Ran Out of Food in the Last Year: Never true  Transportation Needs: No Transportation Needs (10/05/2022)   PRAPARE - Administrator, Civil Service (Medical): No    Lack of Transportation (Non-Medical): No  Physical Activity: Not on file  Stress: Not on file  Social Connections: Not on file  Intimate Partner Violence: Not At Risk (10/05/2022)   Humiliation, Afraid, Rape, and Kick questionnaire    Fear of Current  or Ex-Partner: No    Emotionally Abused: No    Physically Abused: No    Sexually Abused: No    FAMILY HISTORY: Family History  Problem Relation Age of Onset   Lung cancer Maternal Grandfather    Pancreatic cancer Paternal Grandfather     ALLERGIES:  is allergic to duloxetine hcl and zithromax [azithromycin].  MEDICATIONS:  Current Outpatient Medications  Medication Sig Dispense Refill   acyclovir (ZOVIRAX) 400 MG tablet Take 1 tablet (400 mg total) by mouth 2 (two) times daily. 60 tablet 5   albuterol (VENTOLIN HFA) 108 (90 Base) MCG/ACT inhaler Inhale 1-2 puffs into the lungs every 6 (six) hours as needed for wheezing.     amLODipine (NORVASC) 5 MG tablet Take 1 tablet (5 mg total) by mouth daily. HOLD until follow-up appointment with your PCP.     b complex vitamins capsule Take 1 capsule by mouth daily. 30 capsule    docusate sodium (COLACE) 100 MG capsule Take 1 capsule (100 mg total) by mouth 2 (two) times daily. Also available OTC. 60 capsule 0   ergocalciferol (VITAMIN D2) 1.25 MG (50000 UT) capsule Take 1 capsule (50,000 Units total) by mouth once a week.     gabapentin (NEURONTIN) 100 MG capsule Take 1 capsule (100 mg total) by mouth 3 (three) times daily. 90 capsule 2   lenalidomide (REVLIMID) 10 MG capsule TAKE 1 CAPSULE BY MOUTH DAILY  FOR 21 DAYS, THEN 7 DAYS OFF (Patient taking differently: Take 10 mg by mouth See admin instructions. TAKE 1 CAPSULE BY MOUTH DAILY  FOR 21 DAYS, THEN 7 DAYS OFF) 21 capsule 0   medroxyPROGESTERone (PROVERA) 10 MG tablet Take 10 mg by mouth daily.     Melatonin 10 MG TABS Take 20 mg by mouth at bedtime.     metoprolol tartrate (LOPRESSOR) 50 MG tablet Take 1 tablet (50 mg total) by mouth 2 (two) times daily. 60 tablet 3   omeprazole (PRILOSEC) 20 MG capsule Take 20 mg by mouth daily.     ondansetron (ZOFRAN) 4 MG tablet Take 2 tablets (8 mg total) by mouth every 8 (eight) hours as needed for nausea or vomiting. 30 tablet 0   oxyCODONE (OXY  IR/ROXICODONE) 5 MG immediate release tablet Take 1 tablet (5 mg total) by mouth every 6 (six) hours as needed for severe pain (pain score 7-10). 60 tablet 0   polyethylene glycol (MIRALAX / GLYCOLAX) 17 g packet Take 17 g by mouth daily as needed for mild constipation.     pravastatin (PRAVACHOL) 40 MG tablet Take 40 mg by mouth daily.     prochlorperazine (COMPAZINE) 10 MG tablet Take 1 tablet (10 mg total) by mouth every 6 (six) hours as needed for nausea or vomiting. 90 tablet 2   No current facility-administered medications for this visit.  REVIEW OF SYSTEMS:   . 10 Point review of Systems was done is negative except as noted above.   PHYSICAL EXAMINATION: ECOG PERFORMANCE STATUS: 2 - Symptomatic, <50% confined to bed .BP (!) 125/59 (BP Location: Left Arm, Patient Position: Sitting)   Pulse 88   Temp 97.9 F (36.6 C) (Temporal)   Resp 14   Ht 5\' 3"  (1.6 m)   Wt 255 lb 14.4 oz (116.1 kg)   SpO2 97%   BMI 45.33 kg/m  NAD GENERAL:alert, in no acute distress and comfortable SKIN: no acute rashes, no significant lesions EYES: conjunctiva are pink and non-injected, sclera anicteric NECK: supple, no JVD LYMPH:  no palpable lymphadenopathy in the cervical, axillary or inguinal regions LUNGS: clear to auscultation b/l with normal respiratory effort HEART: regular rate & rhythm ABDOMEN:  normoactive bowel sounds , non tender, not distended. Extremity: no pedal edema PSYCH: alert & oriented x 3 with fluent speech NEURO: no focal motor/sensory deficits  Exam performed in chair.  LABORATORY DATA:  I have reviewed the data as listed  .Marland Kitchen    Latest Ref Rng & Units 01/05/2023   11:37 AM 11/10/2022   10:12 AM 10/19/2022    3:10 AM  CBC  WBC 4.0 - 10.5 K/uL 10.3  9.8  5.4   Hemoglobin 12.0 - 15.0 g/dL 16.1  9.3  7.9   Hematocrit 36.0 - 46.0 % 30.7  28.5  25.2   Platelets 150 - 400 K/uL 230  222  151     .    Latest Ref Rng & Units 01/05/2023   11:37 AM 11/10/2022   10:12 AM  10/19/2022    3:10 AM  CMP  Glucose 70 - 99 mg/dL 096  045  409   BUN 6 - 20 mg/dL 33  31  69   Creatinine 0.44 - 1.00 mg/dL 8.11  9.14  7.82   Sodium 135 - 145 mmol/L 138  138  137   Potassium 3.5 - 5.1 mmol/L 3.7  3.8  3.8   Chloride 98 - 111 mmol/L 105  110  110   CO2 22 - 32 mmol/L 24  18  18    Calcium 8.9 - 10.3 mg/dL 9.9  9.3  8.5   Total Protein 6.5 - 8.1 g/dL 7.5  7.4    Total Bilirubin <1.2 mg/dL 0.5  0.4    Alkaline Phos 38 - 126 U/L 74  82    AST 15 - 41 U/L 21  12    ALT 0 - 44 U/L 35  13      RADIOGRAPHIC STUDIES: I have personally reviewed the radiological images as listed and agreed with the findings in the report. No results found.   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  2) h/o hypercalcemia due to multiple myeloma-now resolved with IV fluids, calcitonin, pamidronate. 3) h/o anemia due to multiple myeloma becoming more apparent as the patient's hemoconcentration due to dehydration has been corrected.   4) h/o acute renal failure related to dehydration hypercalcemia and multiple myeloma.  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  PLAN: -Discussed lab results from today, 01/05/2023, in detail with the patient. CBC shows low but improved hemoglobin of 10.3 g/dL with hematocrit of 52.8%. CMP shows CKD creatinine improved to  2.28 -Continue Gabapentin 100 mg morning and afternoon. Increase Gabapentin to 300 mg at night.  -continue to Hold revlimid and hold Zometa infusion due to elevated creatinine and kidney function.  -Discussed the option of other medication than Revlimid. -Patient wants to proceed with Revlimid if Kidney numbers are stable. She would like to start Revlimid, if everything is stable, after January.  -We will discuss about continuing Revlimid or changing medication during our next visit.   FOLLOW UP: RTC with Dr Candise Che with labs in 2 months  The total time spent in the appointment was 30 minutes* .  All of the patient's questions were answered with apparent satisfaction. The patient knows to call the clinic with any problems, questions or concerns.   Wyvonnia Lora MD MS AAHIVMS [facility] Hematology/Oncology Physician [facility]  .*Total Encounter Time as defined by the Centers for Medicare and Medicaid Services includes, in addition to the face-to-face time of a patient visit (documented in the note above) non-face-to-face time: obtaining and reviewing outside history, ordering and reviewing medications, tests or procedures, care coordination (communications with other health care professionals or caregivers) and documentation in the medical record.   I,Param Shah,acting as a Neurosurgeon for Wyvonnia Lora, MD.,have documented all relevant documentation on the behalf of Wyvonnia Lora, MD,as directed by  Wyvonnia Lora, MD while in the presence of Wyvonnia Lora, MD.  .I have reviewed the above documentation for accuracy and completeness, and I agree with the above. Johney Maine MD

## 2023-01-07 ENCOUNTER — Telehealth: Payer: Self-pay | Admitting: Hematology

## 2023-01-07 NOTE — Telephone Encounter (Signed)
Patient is aware of scheduled appointment times/dates

## 2023-01-08 ENCOUNTER — Other Ambulatory Visit: Payer: Self-pay

## 2023-01-10 LAB — MULTIPLE MYELOMA PANEL, SERUM
Albumin SerPl Elph-Mcnc: 3.8 g/dL (ref 2.9–4.4)
Albumin/Glob SerPl: 1.3 (ref 0.7–1.7)
Alpha 1: 0.3 g/dL (ref 0.0–0.4)
Alpha2 Glob SerPl Elph-Mcnc: 0.8 g/dL (ref 0.4–1.0)
B-Globulin SerPl Elph-Mcnc: 1.2 g/dL (ref 0.7–1.3)
Gamma Glob SerPl Elph-Mcnc: 0.8 g/dL (ref 0.4–1.8)
Globulin, Total: 3 g/dL (ref 2.2–3.9)
IgA: 280 mg/dL (ref 87–352)
IgG (Immunoglobin G), Serum: 891 mg/dL (ref 586–1602)
IgM (Immunoglobulin M), Srm: 24 mg/dL — ABNORMAL LOW (ref 26–217)
Total Protein ELP: 6.8 g/dL (ref 6.0–8.5)

## 2023-01-11 ENCOUNTER — Encounter: Payer: Self-pay | Admitting: Hematology

## 2023-01-17 ENCOUNTER — Other Ambulatory Visit: Payer: Self-pay | Admitting: Hematology

## 2023-01-20 ENCOUNTER — Other Ambulatory Visit: Payer: Self-pay | Admitting: Hematology

## 2023-01-20 DIAGNOSIS — C9001 Multiple myeloma in remission: Secondary | ICD-10-CM

## 2023-02-08 ENCOUNTER — Other Ambulatory Visit: Payer: Self-pay

## 2023-02-08 DIAGNOSIS — C9001 Multiple myeloma in remission: Secondary | ICD-10-CM

## 2023-02-09 ENCOUNTER — Encounter: Payer: Self-pay | Admitting: Hematology

## 2023-02-09 ENCOUNTER — Other Ambulatory Visit: Payer: Self-pay | Admitting: Hematology

## 2023-02-09 DIAGNOSIS — C9001 Multiple myeloma in remission: Secondary | ICD-10-CM

## 2023-02-26 ENCOUNTER — Other Ambulatory Visit: Payer: Self-pay | Admitting: Hematology

## 2023-02-26 DIAGNOSIS — C9001 Multiple myeloma in remission: Secondary | ICD-10-CM

## 2023-03-01 ENCOUNTER — Encounter: Payer: Self-pay | Admitting: Hematology

## 2023-03-02 ENCOUNTER — Ambulatory Visit: Payer: Medicare Other

## 2023-03-02 ENCOUNTER — Other Ambulatory Visit: Payer: Medicare Other

## 2023-03-15 ENCOUNTER — Other Ambulatory Visit: Payer: Self-pay

## 2023-03-15 DIAGNOSIS — C7951 Secondary malignant neoplasm of bone: Secondary | ICD-10-CM

## 2023-03-15 DIAGNOSIS — C9001 Multiple myeloma in remission: Secondary | ICD-10-CM

## 2023-03-16 ENCOUNTER — Encounter: Payer: Self-pay | Admitting: Hematology

## 2023-03-16 ENCOUNTER — Inpatient Hospital Stay (HOSPITAL_BASED_OUTPATIENT_CLINIC_OR_DEPARTMENT_OTHER): Payer: 59 | Admitting: Hematology

## 2023-03-16 ENCOUNTER — Inpatient Hospital Stay: Payer: 59 | Attending: Hematology

## 2023-03-16 ENCOUNTER — Other Ambulatory Visit: Payer: Self-pay

## 2023-03-16 ENCOUNTER — Other Ambulatory Visit (HOSPITAL_COMMUNITY): Payer: Self-pay

## 2023-03-16 VITALS — BP 117/72 | HR 84 | Temp 98.4°F | Resp 18 | Wt 257.1 lb

## 2023-03-16 DIAGNOSIS — C9001 Multiple myeloma in remission: Secondary | ICD-10-CM

## 2023-03-16 DIAGNOSIS — Z801 Family history of malignant neoplasm of trachea, bronchus and lung: Secondary | ICD-10-CM | POA: Diagnosis not present

## 2023-03-16 DIAGNOSIS — Z87891 Personal history of nicotine dependence: Secondary | ICD-10-CM | POA: Diagnosis not present

## 2023-03-16 DIAGNOSIS — G893 Neoplasm related pain (acute) (chronic): Secondary | ICD-10-CM | POA: Diagnosis not present

## 2023-03-16 DIAGNOSIS — Z800 Family history of malignant neoplasm of digestive organs: Secondary | ICD-10-CM | POA: Diagnosis not present

## 2023-03-16 DIAGNOSIS — C7951 Secondary malignant neoplasm of bone: Secondary | ICD-10-CM

## 2023-03-16 DIAGNOSIS — C9000 Multiple myeloma not having achieved remission: Secondary | ICD-10-CM | POA: Insufficient documentation

## 2023-03-16 LAB — CBC WITH DIFFERENTIAL (CANCER CENTER ONLY)
Abs Immature Granulocytes: 0.03 10*3/uL (ref 0.00–0.07)
Basophils Absolute: 0 10*3/uL (ref 0.0–0.1)
Basophils Relative: 1 %
Eosinophils Absolute: 0.2 10*3/uL (ref 0.0–0.5)
Eosinophils Relative: 2 %
HCT: 38 % (ref 36.0–46.0)
Hemoglobin: 12.3 g/dL (ref 12.0–15.0)
Immature Granulocytes: 0 %
Lymphocytes Relative: 11 %
Lymphs Abs: 0.9 10*3/uL (ref 0.7–4.0)
MCH: 33.2 pg (ref 26.0–34.0)
MCHC: 32.4 g/dL (ref 30.0–36.0)
MCV: 102.7 fL — ABNORMAL HIGH (ref 80.0–100.0)
Monocytes Absolute: 0.7 10*3/uL (ref 0.1–1.0)
Monocytes Relative: 8 %
Neutro Abs: 6.5 10*3/uL (ref 1.7–7.7)
Neutrophils Relative %: 78 %
Platelet Count: 251 10*3/uL (ref 150–400)
RBC: 3.7 MIL/uL — ABNORMAL LOW (ref 3.87–5.11)
RDW: 13.8 % (ref 11.5–15.5)
WBC Count: 8.4 10*3/uL (ref 4.0–10.5)
nRBC: 0 % (ref 0.0–0.2)

## 2023-03-16 LAB — CMP (CANCER CENTER ONLY)
ALT: 27 U/L (ref 0–44)
AST: 19 U/L (ref 15–41)
Albumin: 4.4 g/dL (ref 3.5–5.0)
Alkaline Phosphatase: 62 U/L (ref 38–126)
Anion gap: 7 (ref 5–15)
BUN: 28 mg/dL — ABNORMAL HIGH (ref 6–20)
CO2: 27 mmol/L (ref 22–32)
Calcium: 9.7 mg/dL (ref 8.9–10.3)
Chloride: 106 mmol/L (ref 98–111)
Creatinine: 1.93 mg/dL — ABNORMAL HIGH (ref 0.44–1.00)
GFR, Estimated: 32 mL/min — ABNORMAL LOW (ref >60)
Glucose, Bld: 112 mg/dL — ABNORMAL HIGH (ref 70–99)
Potassium: 4.6 mmol/L (ref 3.5–5.1)
Sodium: 140 mmol/L (ref 135–145)
Total Bilirubin: 0.3 mg/dL (ref 0.0–1.2)
Total Protein: 7.4 g/dL (ref 6.5–8.1)

## 2023-03-16 LAB — MAGNESIUM: Magnesium: 2.1 mg/dL (ref 1.7–2.4)

## 2023-03-16 LAB — PREGNANCY, URINE: Preg Test, Ur: NEGATIVE

## 2023-03-16 MED ORDER — APIXABAN 2.5 MG PO TABS: 2.5000 mg | ORAL_TABLET | Freq: Two times a day (BID) | ORAL | 5 refills | Status: DC

## 2023-03-16 MED ORDER — OXYCODONE HCL 5 MG PO TABS: 5.0000 mg | ORAL_TABLET | Freq: Three times a day (TID) | ORAL | 0 refills | Status: DC | PRN

## 2023-03-16 MED ORDER — TIZANIDINE HCL 2 MG PO CAPS: 2.0000 mg | ORAL_CAPSULE | Freq: Two times a day (BID) | ORAL | 1 refills | Status: DC | PRN

## 2023-03-16 MED ORDER — ACETAMINOPHEN 500 MG PO TABS: 1000.0000 mg | ORAL_TABLET | Freq: Two times a day (BID) | ORAL | Status: AC

## 2023-03-16 MED ORDER — LENALIDOMIDE 5 MG PO CAPS
5.0000 mg | ORAL_CAPSULE | Freq: Every day | ORAL | 5 refills | Status: DC
  Filled 2023-03-16: qty 21, 21d supply, fill #0

## 2023-03-16 MED ORDER — GABAPENTIN 100 MG PO CAPS: ORAL_CAPSULE | ORAL | 2 refills | Status: DC

## 2023-03-16 NOTE — Progress Notes (Signed)
HEMATOLOGY/ONCOLOGY CLINIC NOTE  Date of Service: 03/16/23   Patient Care Team: Benita Stabile, MD as PCP - General (Internal Medicine) Gilmer Mor, DO as Consulting Physician (Interventional Radiology)  CHIEF COMPLAINTS/PURPOSE OF CONSULTATION:  Follow-up for continued evaluation management of myeloma  HISTORY OF PRESENTING ILLNESS:  Please see previous note for HPI.  INTERVAL HISTORY:  Stacey Montoya is a 48 y.o. female here for continued valuation and management of multiple myeloma.   Patient was last seen by me on 01/05/2023 and she complained of worsened bilateral leg neuropathy, mild SOB, and chronic back pain.   Patient is accompanied by her mother during this visit. Patient notes she has been doing well overall. She denies any new infection issues, fever, chills, night sweats, SOB, chest pain, abdominal pain, or abnormal bowel movements. She does report occasional bilateral leg swelling when she does not wear compression socks. She does complain of chronic lower back pain.   She regularly wears her compression socks.   Patient notes she had mild cold around 1-2 weeks ago. Her symptoms included mild congestions. She visited her PCP, who prescribed her antibiotics.   She is UTD with COVID-19 Booster and Influenza vaccine. She has not received RSV vaccine.  She is compliant with all her medications. She discontinued her blood thinner in August 2024.   Patient notes her appetite has been stable and stays well-hydrated.   Patient takes Oxycodone 5 mg as needed for pain, which has been improving her back pain. She notes that her back pain worsens when she is doing some daily activities. She does not use back brace.   MEDICAL HISTORY:  Past Medical History:  Diagnosis Date   Abnormal Pap smear of cervix    Back pain    Cancer (HCC) 04/02/2020   multiple myeloma   Hyperlipidemia    Hypertension     SURGICAL HISTORY: Past Surgical History:  Procedure Laterality  Date   CERVICAL CONIZATION W/BX  12/02/2010   Procedure: CONIZATION CERVIX WITH BIOPSY;  Surgeon: Melony Overly;  Location: [facility];  Service: Gynecology;  Laterality: N/A;  COLD KNIFE   CESAREAN SECTION     DILATION AND CURETTAGE OF UTERUS  12/02/2010   Procedure: DILATATION AND CURETTAGE (D&C);  Surgeon: Melony Overly;  Location: [facility];  Service: Gynecology;  Laterality: N/A;   IR RADIOLOGIST EVAL & MGMT  04/16/2020   KYPHOPLASTY Bilateral 09/05/2020   Procedure: KYPHOPLASTY THORACIC TWELVE AND LUMBAR FOUR;  Surgeon: Lisbeth Renshaw, MD;  Location: [facility];  Service: Neurosurgery;  Laterality: Bilateral;   left hand     drain - infection   WISDOM TOOTH EXTRACTION      SOCIAL HISTORY: Social History   Socioeconomic History   Marital status: Married    Spouse name: Not on file   Number of children: Not on file   Years of education: Not on file   Highest education level: Not on file  Occupational History   Not on file  Tobacco Use   Smoking status: Former    Current packs/day: 0.50    Average packs/day: 0.5 packs/day for 20.0 years (10.0 ttl pk-yrs)    Types: Cigarettes   Smokeless tobacco: Never  Vaping Use   Vaping status: Some Days  Substance and Sexual Activity   Alcohol use: Not Currently   Drug use: Not Currently   Sexual activity: Not Currently    Partners: Male    Birth control/protection: Condom  Other Topics Concern  Not on file  Social History Narrative   ** Merged History Encounter **       Social Drivers of Health   Financial Resource Strain: Not on file  Food Insecurity: No Food Insecurity (10/05/2022)   Hunger Vital Sign    Worried About Running Out of Food in the Last Year: Never true    Ran Out of Food in the Last Year: Never true  Transportation Needs: No Transportation Needs (10/05/2022)   PRAPARE - Administrator, Civil Service (Medical): No    Lack of Transportation (Non-Medical): No  Physical Activity: Not on file  Stress: Not on  file  Social Connections: Not on file  Intimate Partner Violence: Not At Risk (10/05/2022)   Humiliation, Afraid, Rape, and Kick questionnaire    Fear of Current or Ex-Partner: No    Emotionally Abused: No    Physically Abused: No    Sexually Abused: No    FAMILY HISTORY: Family History  Problem Relation Age of Onset   Lung cancer Maternal Grandfather    Pancreatic cancer Paternal Grandfather     ALLERGIES:  is allergic to duloxetine hcl and zithromax [azithromycin].  MEDICATIONS:  Current Outpatient Medications  Medication Sig Dispense Refill   acyclovir (ZOVIRAX) 400 MG tablet TAKE (1) TABLET BY MOUTH TWICE DAILY. 60 tablet 5   albuterol (VENTOLIN HFA) 108 (90 Base) MCG/ACT inhaler Inhale 1-2 puffs into the lungs every 6 (six) hours as needed for wheezing.     amLODipine (NORVASC) 5 MG tablet Take 1 tablet (5 mg total) by mouth daily. HOLD until follow-up appointment with your PCP.     b complex vitamins capsule Take 1 capsule by mouth daily. 30 capsule    docusate sodium (COLACE) 100 MG capsule Take 1 capsule (100 mg total) by mouth 2 (two) times daily. Also available OTC. 60 capsule 0   ergocalciferol (VITAMIN D2) 1.25 MG (50000 UT) capsule Take 1 capsule (50,000 Units total) by mouth once a week.     gabapentin (NEURONTIN) 100 MG capsule 1 cap (100mg ) in the morning at 8AM and at noon and 3 caps (300mg ) at bedtime 150 capsule 2   lenalidomide (REVLIMID) 10 MG capsule TAKE 1 CAPSULE BY MOUTH DAILY  FOR 21 DAYS, THEN 7 DAYS OFF (Patient taking differently: Take 10 mg by mouth See admin instructions. TAKE 1 CAPSULE BY MOUTH DAILY  FOR 21 DAYS, THEN 7 DAYS OFF) 21 capsule 0   medroxyPROGESTERone (PROVERA) 10 MG tablet Take 10 mg by mouth daily.     Melatonin 10 MG TABS Take 20 mg by mouth at bedtime.     metoprolol tartrate (LOPRESSOR) 50 MG tablet Take 1 tablet (50 mg total) by mouth 2 (two) times daily. 60 tablet 3   omeprazole (PRILOSEC) 20 MG capsule Take 20 mg by mouth daily.      ondansetron (ZOFRAN) 4 MG tablet Take 2 tablets (8 mg total) by mouth every 8 (eight) hours as needed for nausea or vomiting. 30 tablet 0   oxyCODONE (OXY IR/ROXICODONE) 5 MG immediate release tablet Take 1 tablet (5 mg total) by mouth every 8 (eight) hours as needed for severe pain (pain score 7-10). 60 tablet 0   polyethylene glycol (MIRALAX / GLYCOLAX) 17 g packet Take 17 g by mouth daily as needed for mild constipation.     pravastatin (PRAVACHOL) 40 MG tablet Take 40 mg by mouth daily.     prochlorperazine (COMPAZINE) 10 MG tablet Take 1 tablet (10 mg  total) by mouth every 6 (six) hours as needed for nausea or vomiting. 90 tablet 2   No current facility-administered medications for this visit.    REVIEW OF SYSTEMS:   . 10 Point review of Systems was done is negative except as noted above.   PHYSICAL EXAMINATION: ECOG PERFORMANCE STATUS: 2 - Symptomatic, <50% confined to bed .BP 117/72 (BP Location: Left Arm, Patient Position: Sitting)   Pulse 84   Temp 98.4 F (36.9 C) (Temporal)   Resp 18   Wt 257 lb 1.6 oz (116.6 kg)   SpO2 97%   BMI 45.54 kg/m  NAD GENERAL:alert, in no acute distress and comfortable SKIN: no acute rashes, no significant lesions EYES: conjunctiva are pink and non-injected, sclera anicteric NECK: supple, no JVD LYMPH:  no palpable lymphadenopathy in the cervical, axillary or inguinal regions LUNGS: clear to auscultation b/l with normal respiratory effort HEART: regular rate & rhythm ABDOMEN:  normoactive bowel sounds , non tender, not distended. Extremity: no pedal edema PSYCH: alert & oriented x 3 with fluent speech NEURO: no focal motor/sensory deficits  Exam performed in chair.  LABORATORY DATA:  I have reviewed the data as listed  .Marland Kitchen    Latest Ref Rng & Units 03/16/2023   10:02 AM 01/05/2023   11:37 AM 11/10/2022   10:12 AM  CBC  WBC 4.0 - 10.5 K/uL 8.4  10.3  9.8   Hemoglobin 12.0 - 15.0 g/dL 01.0  27.2  9.3   Hematocrit 36.0 - 46.0 %  38.0  30.7  28.5   Platelets 150 - 400 K/uL 251  230  222     .    Latest Ref Rng & Units 03/16/2023   10:02 AM 01/05/2023   11:37 AM 11/10/2022   10:12 AM  CMP  Glucose 70 - 99 mg/dL 536  644  034   BUN 6 - 20 mg/dL 28  33  31   Creatinine 0.44 - 1.00 mg/dL 7.42  5.95  6.38   Sodium 135 - 145 mmol/L 140  138  138   Potassium 3.5 - 5.1 mmol/L 4.6  3.7  3.8   Chloride 98 - 111 mmol/L 106  105  110   CO2 22 - 32 mmol/L 27  24  18    Calcium 8.9 - 10.3 mg/dL 9.7  9.9  9.3   Total Protein 6.5 - 8.1 g/dL 7.4  7.5  7.4   Total Bilirubin 0.0 - 1.2 mg/dL 0.3  0.5  0.4   Alkaline Phos 38 - 126 U/L 62  74  82   AST 15 - 41 U/L 19  21  12    ALT 0 - 44 U/L 27  35  13     RADIOGRAPHIC STUDIES: I have personally reviewed the radiological images as listed and agreed with the findings in the report. No results found.   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  2) h/o hypercalcemia due to multiple myeloma-now resolved with IV fluids, calcitonin, pamidronate. 3) h/o anemia due to multiple myeloma becoming more apparent as the patient's hemoconcentration due to dehydration has been corrected.   4)  h/o acute renal failure related to dehydration hypercalcemia and multiple myeloma.  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  PLAN: -Discussed lab results from  today, 03/16/2023, in detail with the patient. CBC stable. CMP shows elevated BUN of 28 and elevated Creatine of 1.93, which has improved from 2.28. -Discussed the option of starting Revlimid back. She agrees.  -Will prescribe Revlimid at 5 mg.  -Will prescribe Eliquis 2.5 mg.  -Discussed with the patient to wear back brace to help her back pain.  -discussed the option of starting muscle relaxer as needed for back pain. Patient agrees. She can also take tylenol.  -Will prescribe tizanidine 2 mg twice daily as needed.  -Increase gabapentin 200 mg in the morning and afternoon. Continue 300 mg at night.  -Answered all of patient's questions.   FOLLOW UP: RTC with Dr Candise Che with labs in 2 months   The total time spent in the appointment was 30 minutes* .  All of the patient's questions were answered with apparent satisfaction. The patient knows to call the clinic with any problems, questions or concerns.   Wyvonnia Lora MD MS AAHIVMS [facility] Hematology/Oncology Physician [facility]  .*Total Encounter Time as defined by the Centers for Medicare and Medicaid Services includes, in addition to the face-to-face time of a patient visit (documented in the note above) non-face-to-face time: obtaining and reviewing outside history, ordering and reviewing medications, tests or procedures, care coordination (communications with other health care professionals or caregivers) and documentation in the medical record.   I,Param Shah,acting as a Neurosurgeon for Wyvonnia Lora, MD.,have documented all relevant documentation on the behalf of Wyvonnia Lora, MD,as directed by  Wyvonnia Lora, MD while in the presence of Wyvonnia Lora, MD.  .I have reviewed the above documentation for accuracy and completeness, and I agree with the above. Johney Maine MD

## 2023-03-17 ENCOUNTER — Telehealth: Payer: Self-pay

## 2023-03-17 ENCOUNTER — Other Ambulatory Visit (HOSPITAL_COMMUNITY): Payer: Self-pay

## 2023-03-18 ENCOUNTER — Other Ambulatory Visit: Payer: Self-pay

## 2023-03-18 DIAGNOSIS — C9001 Multiple myeloma in remission: Secondary | ICD-10-CM

## 2023-03-18 MED ORDER — LENALIDOMIDE 5 MG PO CAPS: 5.0000 mg | ORAL_CAPSULE | Freq: Every day | ORAL | 5 refills | Status: DC

## 2023-03-18 NOTE — Telephone Encounter (Signed)
Oral Oncology Pharmacist Encounter  Received new prescription for Revlimid (lenalidomide) for the treatment of multiple myeloma, planned duration until disease progression or unacceptable toxicity.  Labs from 03/16/2023 assessed, no interventions needed. Patients creatinine is 1.93 and she has a creatinine clearance of 67 likely due to age. Prescription dose and frequency assessed for appropriateness.   Current medication list in Epic reviewed, no significant/ relevant DDIs with Revlimid identified.  Evaluated chart and no patient barriers to medication adherence noted.   Patient agreement for treatment documented in MD note on 03/16/2023.  Prescription has been e-scribed to the [facility] for benefits analysis and approval.  Oral Oncology Clinic will continue to follow for insurance authorization, copayment issues, initial counseling and start date.  Bethel Born, PharmD Hematology/Oncology Clinical Pharmacist [facility] [phone]

## 2023-03-19 LAB — MULTIPLE MYELOMA PANEL, SERUM
Albumin SerPl Elph-Mcnc: 3.9 g/dL (ref 2.9–4.4)
Albumin/Glob SerPl: 1.4 (ref 0.7–1.7)
Alpha 1: 0.2 g/dL (ref 0.0–0.4)
Alpha2 Glob SerPl Elph-Mcnc: 0.8 g/dL (ref 0.4–1.0)
B-Globulin SerPl Elph-Mcnc: 1.1 g/dL (ref 0.7–1.3)
Gamma Glob SerPl Elph-Mcnc: 0.6 g/dL (ref 0.4–1.8)
Globulin, Total: 2.8 g/dL (ref 2.2–3.9)
IgA: 200 mg/dL (ref 87–352)
IgG (Immunoglobin G), Serum: 693 mg/dL (ref 586–1602)
IgM (Immunoglobulin M), Srm: 22 mg/dL — ABNORMAL LOW (ref 26–217)
Total Protein ELP: 6.7 g/dL (ref 6.0–8.5)

## 2023-03-22 ENCOUNTER — Encounter: Payer: Self-pay | Admitting: Hematology

## 2023-03-23 NOTE — Telephone Encounter (Signed)
 Oral Chemotherapy Pharmacist Encounter  I spoke with patient for overview of: Revlimid  for the treatment of multiple myeloma, planned duration until disease progression or unacceptable toxicity. Patient has been on revlimid  previously and is restarting.   Counseled patient on administration, dosing, side effects, monitoring, drug-food interactions, safe handling, storage, and disposal.  Patient will take Revlimid  5mg  capsules, 1 capsule by mouth once daily, without regard to food, with a full glass of water . Revlimid  will be given 21 days on, 7 days off, repeat every 28 days. Patient is on low-dose eliquis  for vte prophylaxis.  Revlimid  start date: 03/21/2023  Adverse effects of Revlimid  include but are not limited to: nausea, constipation, diarrhea, abdominal pain, rash, fatigue, drug fever, and decreased blood counts.    Reviewed with patient importance of keeping a medication schedule and plan for any missed doses. No barriers to medication adherence identified.  Medication reconciliation performed and medication/allergy list updated.  All questions answered. Patient voiced understanding and appreciation. Medication education handout placed in mail for patient. Patient knows to call the office with questions or concerns. Oral Chemotherapy Clinic phone number provided to patient.   Shareece Bultman, PharmD Hematology/Oncology Clinical Pharmacist [facility] [phone]

## 2023-04-05 ENCOUNTER — Other Ambulatory Visit: Payer: Self-pay | Admitting: Hematology

## 2023-04-05 DIAGNOSIS — C9001 Multiple myeloma in remission: Secondary | ICD-10-CM

## 2023-04-07 ENCOUNTER — Other Ambulatory Visit: Payer: Self-pay

## 2023-04-07 ENCOUNTER — Telehealth: Payer: Self-pay | Admitting: Hematology

## 2023-04-07 NOTE — Telephone Encounter (Signed)
Called patient/ unable to leave vm (mailbox full)

## 2023-04-09 ENCOUNTER — Other Ambulatory Visit: Payer: Self-pay

## 2023-04-10 ENCOUNTER — Other Ambulatory Visit: Payer: Self-pay | Admitting: Hematology

## 2023-04-10 DIAGNOSIS — C9001 Multiple myeloma in remission: Secondary | ICD-10-CM

## 2023-04-12 ENCOUNTER — Other Ambulatory Visit: Payer: Self-pay

## 2023-04-12 DIAGNOSIS — C9001 Multiple myeloma in remission: Secondary | ICD-10-CM

## 2023-04-13 ENCOUNTER — Encounter: Payer: Self-pay | Admitting: Hematology

## 2023-04-13 ENCOUNTER — Inpatient Hospital Stay: Payer: Medicare Other | Attending: Hematology

## 2023-04-13 DIAGNOSIS — C9000 Multiple myeloma not having achieved remission: Secondary | ICD-10-CM | POA: Insufficient documentation

## 2023-04-13 DIAGNOSIS — C9001 Multiple myeloma in remission: Secondary | ICD-10-CM

## 2023-04-13 LAB — CMP (CANCER CENTER ONLY)
ALT: 29 U/L (ref 0–44)
AST: 14 U/L — ABNORMAL LOW (ref 15–41)
Albumin: 4.3 g/dL (ref 3.5–5.0)
Alkaline Phosphatase: 57 U/L (ref 38–126)
Anion gap: 8 (ref 5–15)
BUN: 20 mg/dL (ref 6–20)
CO2: 25 mmol/L (ref 22–32)
Calcium: 10 mg/dL (ref 8.9–10.3)
Chloride: 107 mmol/L (ref 98–111)
Creatinine: 1.7 mg/dL — ABNORMAL HIGH (ref 0.44–1.00)
GFR, Estimated: 37 mL/min — ABNORMAL LOW (ref >60)
Glucose, Bld: 107 mg/dL — ABNORMAL HIGH (ref 70–99)
Potassium: 3.9 mmol/L (ref 3.5–5.1)
Sodium: 140 mmol/L (ref 135–145)
Total Bilirubin: 0.6 mg/dL (ref 0.0–1.2)
Total Protein: 7.2 g/dL (ref 6.5–8.1)

## 2023-04-13 LAB — CBC WITH DIFFERENTIAL (CANCER CENTER ONLY)
Abs Immature Granulocytes: 0.01 10*3/uL (ref 0.00–0.07)
Basophils Absolute: 0.1 10*3/uL (ref 0.0–0.1)
Basophils Relative: 1 %
Eosinophils Absolute: 0.3 10*3/uL (ref 0.0–0.5)
Eosinophils Relative: 5 %
HCT: 39.9 % (ref 36.0–46.0)
Hemoglobin: 13.1 g/dL (ref 12.0–15.0)
Immature Granulocytes: 0 %
Lymphocytes Relative: 17 %
Lymphs Abs: 1.1 10*3/uL (ref 0.7–4.0)
MCH: 33.2 pg (ref 26.0–34.0)
MCHC: 32.8 g/dL (ref 30.0–36.0)
MCV: 101.3 fL — ABNORMAL HIGH (ref 80.0–100.0)
Monocytes Absolute: 0.5 10*3/uL (ref 0.1–1.0)
Monocytes Relative: 8 %
Neutro Abs: 4.4 10*3/uL (ref 1.7–7.7)
Neutrophils Relative %: 69 %
Platelet Count: 234 10*3/uL (ref 150–400)
RBC: 3.94 MIL/uL (ref 3.87–5.11)
RDW: 14.4 % (ref 11.5–15.5)
WBC Count: 6.3 10*3/uL (ref 4.0–10.5)
nRBC: 0 % (ref 0.0–0.2)

## 2023-04-13 LAB — PREGNANCY, URINE: Preg Test, Ur: NEGATIVE

## 2023-04-15 ENCOUNTER — Other Ambulatory Visit: Payer: Self-pay | Admitting: Hematology

## 2023-04-15 DIAGNOSIS — C9001 Multiple myeloma in remission: Secondary | ICD-10-CM

## 2023-04-15 NOTE — Progress Notes (Signed)
 Received notification of prior authorization approval for Lenalidomide 5mg  Capsules. Medication is approved through 04/14/2024. No other needs or concerns noted at this time.

## 2023-04-16 ENCOUNTER — Other Ambulatory Visit: Payer: Self-pay

## 2023-04-18 ENCOUNTER — Encounter: Payer: Self-pay | Admitting: Hematology

## 2023-04-20 ENCOUNTER — Other Ambulatory Visit: Payer: Self-pay | Admitting: Obstetrics and Gynecology

## 2023-04-20 DIAGNOSIS — R928 Other abnormal and inconclusive findings on diagnostic imaging of breast: Secondary | ICD-10-CM

## 2023-05-05 ENCOUNTER — Other Ambulatory Visit: Payer: Self-pay | Admitting: Obstetrics and Gynecology

## 2023-05-05 ENCOUNTER — Ambulatory Visit
Admission: RE | Admit: 2023-05-05 | Discharge: 2023-05-05 | Disposition: A | Discharge status: Home / Self Care | Source: Ambulatory Visit | Attending: Obstetrics and Gynecology | Admitting: Obstetrics and Gynecology

## 2023-05-05 DIAGNOSIS — R928 Other abnormal and inconclusive findings on diagnostic imaging of breast: Secondary | ICD-10-CM

## 2023-05-05 DIAGNOSIS — M7989 Other specified soft tissue disorders: Secondary | ICD-10-CM

## 2023-05-06 ENCOUNTER — Other Ambulatory Visit: Payer: Self-pay

## 2023-05-09 ENCOUNTER — Other Ambulatory Visit: Payer: Self-pay | Admitting: Hematology

## 2023-05-09 DIAGNOSIS — C9001 Multiple myeloma in remission: Secondary | ICD-10-CM

## 2023-05-10 ENCOUNTER — Other Ambulatory Visit: Payer: Self-pay

## 2023-05-10 DIAGNOSIS — C9001 Multiple myeloma in remission: Secondary | ICD-10-CM

## 2023-05-11 ENCOUNTER — Inpatient Hospital Stay: Attending: Hematology

## 2023-05-11 ENCOUNTER — Encounter: Payer: Self-pay | Admitting: Hematology

## 2023-05-11 DIAGNOSIS — C9000 Multiple myeloma not having achieved remission: Secondary | ICD-10-CM | POA: Insufficient documentation

## 2023-05-11 DIAGNOSIS — C9001 Multiple myeloma in remission: Secondary | ICD-10-CM

## 2023-05-11 LAB — CBC WITH DIFFERENTIAL (CANCER CENTER ONLY)
Abs Immature Granulocytes: 0.01 10*3/uL (ref 0.00–0.07)
Basophils Absolute: 0.1 10*3/uL (ref 0.0–0.1)
Basophils Relative: 1 %
Eosinophils Absolute: 0.5 10*3/uL (ref 0.0–0.5)
Eosinophils Relative: 7 %
HCT: 40.8 % (ref 36.0–46.0)
Hemoglobin: 13.4 g/dL (ref 12.0–15.0)
Immature Granulocytes: 0 %
Lymphocytes Relative: 17 %
Lymphs Abs: 1.1 10*3/uL (ref 0.7–4.0)
MCH: 33.2 pg (ref 26.0–34.0)
MCHC: 32.8 g/dL (ref 30.0–36.0)
MCV: 101 fL — ABNORMAL HIGH (ref 80.0–100.0)
Monocytes Absolute: 0.6 10*3/uL (ref 0.1–1.0)
Monocytes Relative: 10 %
Neutro Abs: 4.2 10*3/uL (ref 1.7–7.7)
Neutrophils Relative %: 65 %
Platelet Count: 219 10*3/uL (ref 150–400)
RBC: 4.04 MIL/uL (ref 3.87–5.11)
RDW: 15.1 % (ref 11.5–15.5)
WBC Count: 6.4 10*3/uL (ref 4.0–10.5)
nRBC: 0 % (ref 0.0–0.2)

## 2023-05-11 LAB — MAGNESIUM: Magnesium: 1.8 mg/dL (ref 1.7–2.4)

## 2023-05-11 LAB — CMP (CANCER CENTER ONLY)
ALT: 34 U/L (ref 0–44)
AST: 20 U/L (ref 15–41)
Albumin: 4.3 g/dL (ref 3.5–5.0)
Alkaline Phosphatase: 60 U/L (ref 38–126)
Anion gap: 8 (ref 5–15)
BUN: 21 mg/dL — ABNORMAL HIGH (ref 6–20)
CO2: 25 mmol/L (ref 22–32)
Calcium: 9.5 mg/dL (ref 8.9–10.3)
Chloride: 107 mmol/L (ref 98–111)
Creatinine: 1.64 mg/dL — ABNORMAL HIGH (ref 0.44–1.00)
GFR, Estimated: 39 mL/min — ABNORMAL LOW (ref >60)
Glucose, Bld: 118 mg/dL — ABNORMAL HIGH (ref 70–99)
Potassium: 3.9 mmol/L (ref 3.5–5.1)
Sodium: 140 mmol/L (ref 135–145)
Total Bilirubin: 0.5 mg/dL (ref 0.0–1.2)
Total Protein: 7.3 g/dL (ref 6.5–8.1)

## 2023-05-11 LAB — PREGNANCY, URINE: Preg Test, Ur: NEGATIVE

## 2023-05-13 LAB — MULTIPLE MYELOMA PANEL, SERUM
Albumin SerPl Elph-Mcnc: 3.5 g/dL (ref 2.9–4.4)
Albumin/Glob SerPl: 1.3 (ref 0.7–1.7)
Alpha 1: 0.2 g/dL (ref 0.0–0.4)
Alpha2 Glob SerPl Elph-Mcnc: 0.7 g/dL (ref 0.4–1.0)
B-Globulin SerPl Elph-Mcnc: 1.1 g/dL (ref 0.7–1.3)
Gamma Glob SerPl Elph-Mcnc: 0.8 g/dL (ref 0.4–1.8)
Globulin, Total: 2.9 g/dL (ref 2.2–3.9)
IgA: 216 mg/dL (ref 87–352)
IgG (Immunoglobin G), Serum: 915 mg/dL (ref 586–1602)
IgM (Immunoglobulin M), Srm: 18 mg/dL — ABNORMAL LOW (ref 26–217)
Total Protein ELP: 6.4 g/dL (ref 6.0–8.5)

## 2023-05-17 ENCOUNTER — Other Ambulatory Visit: Payer: Self-pay | Admitting: Hematology

## 2023-05-17 DIAGNOSIS — C9001 Multiple myeloma in remission: Secondary | ICD-10-CM

## 2023-05-18 ENCOUNTER — Encounter: Payer: Self-pay | Admitting: Hematology

## 2023-05-18 ENCOUNTER — Other Ambulatory Visit: Payer: Self-pay

## 2023-05-22 ENCOUNTER — Other Ambulatory Visit: Payer: Self-pay | Admitting: Hematology

## 2023-05-24 ENCOUNTER — Encounter: Payer: Self-pay | Admitting: Hematology

## 2023-06-05 ENCOUNTER — Other Ambulatory Visit: Payer: Self-pay | Admitting: Hematology

## 2023-06-05 DIAGNOSIS — C9001 Multiple myeloma in remission: Secondary | ICD-10-CM

## 2023-06-07 ENCOUNTER — Other Ambulatory Visit: Payer: Self-pay

## 2023-06-07 DIAGNOSIS — C9001 Multiple myeloma in remission: Secondary | ICD-10-CM

## 2023-06-08 ENCOUNTER — Encounter: Payer: Self-pay | Admitting: Hematology

## 2023-06-08 ENCOUNTER — Inpatient Hospital Stay: Attending: Hematology

## 2023-06-08 ENCOUNTER — Inpatient Hospital Stay (HOSPITAL_BASED_OUTPATIENT_CLINIC_OR_DEPARTMENT_OTHER): Admitting: Hematology

## 2023-06-08 ENCOUNTER — Other Ambulatory Visit: Payer: Self-pay

## 2023-06-08 VITALS — BP 126/74 | HR 90 | Temp 97.8°F | Resp 16 | Ht 63.0 in | Wt 270.4 lb

## 2023-06-08 DIAGNOSIS — Z79899 Other long term (current) drug therapy: Secondary | ICD-10-CM | POA: Diagnosis not present

## 2023-06-08 DIAGNOSIS — M7989 Other specified soft tissue disorders: Secondary | ICD-10-CM | POA: Insufficient documentation

## 2023-06-08 DIAGNOSIS — G893 Neoplasm related pain (acute) (chronic): Secondary | ICD-10-CM | POA: Diagnosis not present

## 2023-06-08 DIAGNOSIS — Z801 Family history of malignant neoplasm of trachea, bronchus and lung: Secondary | ICD-10-CM | POA: Diagnosis not present

## 2023-06-08 DIAGNOSIS — C9001 Multiple myeloma in remission: Secondary | ICD-10-CM | POA: Diagnosis not present

## 2023-06-08 DIAGNOSIS — C9000 Multiple myeloma not having achieved remission: Secondary | ICD-10-CM | POA: Diagnosis present

## 2023-06-08 DIAGNOSIS — G8929 Other chronic pain: Secondary | ICD-10-CM | POA: Insufficient documentation

## 2023-06-08 DIAGNOSIS — Z87891 Personal history of nicotine dependence: Secondary | ICD-10-CM | POA: Insufficient documentation

## 2023-06-08 DIAGNOSIS — M549 Dorsalgia, unspecified: Secondary | ICD-10-CM | POA: Insufficient documentation

## 2023-06-08 DIAGNOSIS — Z800 Family history of malignant neoplasm of digestive organs: Secondary | ICD-10-CM | POA: Diagnosis not present

## 2023-06-08 LAB — CMP (CANCER CENTER ONLY)
ALT: 49 U/L — ABNORMAL HIGH (ref 0–44)
AST: 23 U/L (ref 15–41)
Albumin: 4.2 g/dL (ref 3.5–5.0)
Alkaline Phosphatase: 60 U/L (ref 38–126)
Anion gap: 8 (ref 5–15)
BUN: 26 mg/dL — ABNORMAL HIGH (ref 6–20)
CO2: 26 mmol/L (ref 22–32)
Calcium: 9.5 mg/dL (ref 8.9–10.3)
Chloride: 106 mmol/L (ref 98–111)
Creatinine: 1.55 mg/dL — ABNORMAL HIGH (ref 0.44–1.00)
GFR, Estimated: 42 mL/min — ABNORMAL LOW
Glucose, Bld: 99 mg/dL (ref 70–99)
Potassium: 3.7 mmol/L (ref 3.5–5.1)
Sodium: 140 mmol/L (ref 135–145)
Total Bilirubin: 0.4 mg/dL (ref 0.0–1.2)
Total Protein: 7 g/dL (ref 6.5–8.1)

## 2023-06-08 LAB — CBC WITH DIFFERENTIAL (CANCER CENTER ONLY)
Abs Immature Granulocytes: 0.01 10*3/uL (ref 0.00–0.07)
Basophils Absolute: 0.1 10*3/uL (ref 0.0–0.1)
Basophils Relative: 1 %
Eosinophils Absolute: 0.6 10*3/uL — ABNORMAL HIGH (ref 0.0–0.5)
Eosinophils Relative: 12 %
HCT: 38.6 % (ref 36.0–46.0)
Hemoglobin: 12.8 g/dL (ref 12.0–15.0)
Immature Granulocytes: 0 %
Lymphocytes Relative: 22 %
Lymphs Abs: 1.1 10*3/uL (ref 0.7–4.0)
MCH: 32.2 pg (ref 26.0–34.0)
MCHC: 33.2 g/dL (ref 30.0–36.0)
MCV: 97 fL (ref 80.0–100.0)
Monocytes Absolute: 0.5 10*3/uL (ref 0.1–1.0)
Monocytes Relative: 10 %
Neutro Abs: 2.7 10*3/uL (ref 1.7–7.7)
Neutrophils Relative %: 55 %
Platelet Count: 196 10*3/uL (ref 150–400)
RBC: 3.98 MIL/uL (ref 3.87–5.11)
RDW: 15.7 % — ABNORMAL HIGH (ref 11.5–15.5)
WBC Count: 4.9 10*3/uL (ref 4.0–10.5)
nRBC: 0 % (ref 0.0–0.2)

## 2023-06-08 LAB — PREGNANCY, URINE: Preg Test, Ur: NEGATIVE

## 2023-06-08 LAB — MAGNESIUM: Magnesium: 1.7 mg/dL (ref 1.7–2.4)

## 2023-06-08 MED ORDER — TRAMADOL HCL 50 MG PO TABS: 50.0000 mg | ORAL_TABLET | Freq: Four times a day (QID) | ORAL | 0 refills | Status: DC | PRN

## 2023-06-08 NOTE — Progress Notes (Signed)
 HEMATOLOGY/ONCOLOGY CLINIC NOTE  Date of Service: 06/08/23   Patient Care Team: Omie Bickers, MD as PCP - General (Internal Medicine) Myrlene Asper, DO as Consulting Physician (Interventional Radiology)  CHIEF COMPLAINTS/PURPOSE OF CONSULTATION:  Follow-up for continued evaluation management of myeloma  HISTORY OF PRESENTING ILLNESS:  Please see previous note for HPI.  INTERVAL HISTORY:  Stacey Montoya is a 48 y.o. female here for continued valuation and management of multiple myeloma.   Patient was last seen by me on 03/16/2023 and she complained of occasional bilateral leg swelling, which improves with compression socks, and chronic back pain. She noted that her back pain worsens with some daily activities.   Patient is accompanied by her mother during this visit. Patient notes she has been doing well overall since our last visit. She notes she has been tolerating her current Revlimid  dose, 5 mg, with mild toxicities of upset stomach, mild diarrhea, and occasional nausea in the morning.   She has been tolerating her current Eliquis  dosage well without any new or severe toxicities.   Patient currently takes 1-2 Oxycodone  daily, which helps her pain. She notes that tiZANidine  2 mg has significantly improved her muscle spasms.   She complains of bilateral leg muscle cramps, which affects her sleep. She currently takes Gabapentin  200 mg in the morning, and 300 mg at night.   Patient recently had mammogram on 05/05/2023, which showed Small area of probable fat necrosis within the UPPER INNER LEFT breast. She will follow-up with her physician in 34-months.   She denies any new infection issues, fever, chills, night sweats, unexpected weight loss, chest pain, abdominal pain, or leg swelling. She does complain of chronic lower back pain.   Patient notes she is inclined to start weight loss medication.    MEDICAL HISTORY:  Past Medical History:  Diagnosis Date   Abnormal Pap  smear of cervix    Back pain    Cancer (HCC) 04/02/2020   multiple myeloma   Hyperlipidemia    Hypertension     SURGICAL HISTORY: Past Surgical History:  Procedure Laterality Date   CERVICAL CONIZATION W/BX  12/02/2010   Procedure: CONIZATION CERVIX WITH BIOPSY;  Surgeon: Lawton Price;  Location: [facility];  Service: Gynecology;  Laterality: N/A;  COLD KNIFE   CESAREAN SECTION     DILATION AND CURETTAGE OF UTERUS  12/02/2010   Procedure: DILATATION AND CURETTAGE (D&C);  Surgeon: Lawton Price;  Location: [facility];  Service: Gynecology;  Laterality: N/A;   IR RADIOLOGIST EVAL & MGMT  04/16/2020   KYPHOPLASTY Bilateral 09/05/2020   Procedure: KYPHOPLASTY THORACIC TWELVE AND LUMBAR FOUR;  Surgeon: Augusto Blonder, MD;  Location: [facility];  Service: Neurosurgery;  Laterality: Bilateral;   left hand     drain - infection   WISDOM TOOTH EXTRACTION      SOCIAL HISTORY: Social History   Socioeconomic History   Marital status: Married    Spouse name: Not on file   Number of children: Not on file   Years of education: Not on file   Highest education level: Not on file  Occupational History   Not on file  Tobacco Use   Smoking status: Former    Current packs/day: 0.50    Average packs/day: 0.5 packs/day for 20.0 years (10.0 ttl pk-yrs)    Types: Cigarettes   Smokeless tobacco: Never  Vaping Use   Vaping status: Some Days  Substance and Sexual Activity   Alcohol use: Not Currently  Drug use: Not Currently   Sexual activity: Not Currently    Partners: Male    Birth control/protection: Condom  Other Topics Concern   Not on file  Social History Narrative   ** Merged History Encounter **       Social Drivers of Health   Financial Resource Strain: Not on file  Food Insecurity: No Food Insecurity (10/05/2022)   Hunger Vital Sign    Worried About Running Out of Food in the Last Year: Never true    Ran Out of Food in the Last Year: Never true  Transportation Needs: No  Transportation Needs (10/05/2022)   PRAPARE - Administrator, Civil Service (Medical): No    Lack of Transportation (Non-Medical): No  Physical Activity: Not on file  Stress: Not on file  Social Connections: Not on file  Intimate Partner Violence: Not At Risk (10/05/2022)   Humiliation, Afraid, Rape, and Kick questionnaire    Fear of Current or Ex-Partner: No    Emotionally Abused: No    Physically Abused: No    Sexually Abused: No    FAMILY HISTORY: Family History  Problem Relation Age of Onset   Lung cancer Maternal Grandfather    Pancreatic cancer Paternal Grandfather     ALLERGIES:  is allergic to duloxetine  hcl and zithromax [azithromycin].  MEDICATIONS:  Current Outpatient Medications  Medication Sig Dispense Refill   acetaminophen  (TYLENOL ) 500 MG tablet Take 2 tablets (1,000 mg total) by mouth in the morning and at bedtime.     acyclovir  (ZOVIRAX ) 400 MG tablet TAKE (1) TABLET BY MOUTH TWICE DAILY. 60 tablet 5   albuterol  (VENTOLIN  HFA) 108 (90 Base) MCG/ACT inhaler Inhale 1-2 puffs into the lungs every 6 (six) hours as needed for wheezing.     amLODipine  (NORVASC ) 5 MG tablet Take 1 tablet (5 mg total) by mouth daily. HOLD until follow-up appointment with your PCP.     apixaban  (ELIQUIS ) 2.5 MG TABS tablet Take 1 tablet (2.5 mg total) by mouth 2 (two) times daily. 60 tablet 5   b complex vitamins capsule Take 1 capsule by mouth daily. 30 capsule    docusate sodium  (COLACE) 100 MG capsule Take 1 capsule (100 mg total) by mouth 2 (two) times daily. Also available OTC. 60 capsule 0   ergocalciferol  (VITAMIN D2) 1.25 MG (50000 UT) capsule Take 1 capsule (50,000 Units total) by mouth once a week.     gabapentin  (NEURONTIN ) 100 MG capsule 2 cap (200mg ) in the morning at 8AM and at noon and 3 caps (300mg ) at bedtime 210 capsule 2   lenalidomide  (REVLIMID ) 5 MG capsule TAKE 1 CAPSULE BY MOUTH DAILY  FOR 21 DAYS, THEN 7 DAYS OFF 21 capsule 5   medroxyPROGESTERone   (PROVERA ) 10 MG tablet Take 10 mg by mouth daily.     Melatonin 10 MG TABS Take 20 mg by mouth at bedtime.     metoprolol  tartrate (LOPRESSOR ) 50 MG tablet Take 1 tablet (50 mg total) by mouth 2 (two) times daily. 60 tablet 3   omeprazole (PRILOSEC) 20 MG capsule Take 20 mg by mouth daily.     ondansetron  (ZOFRAN ) 4 MG tablet Take 2 tablets (8 mg total) by mouth every 8 (eight) hours as needed for nausea or vomiting. 30 tablet 0   oxyCODONE  (OXY IR/ROXICODONE ) 5 MG immediate release tablet Take 1 tablet (5 mg total) by mouth every 8 (eight) hours as needed for severe pain (pain score 7-10). 60 tablet 0  polyethylene glycol (MIRALAX  / GLYCOLAX ) 17 g packet Take 17 g by mouth daily as needed for mild constipation.     pravastatin  (PRAVACHOL ) 40 MG tablet Take 40 mg by mouth daily.     prochlorperazine  (COMPAZINE ) 10 MG tablet Take 1 tablet (10 mg total) by mouth every 6 (six) hours as needed for nausea or vomiting. 30 tablet 2   tiZANidine  (ZANAFLEX ) 2 MG tablet TAKE ONE TABLET BY MOUTH TWICE DAILY AS NEEDED FOR MUSCLE SPASMS 30 tablet 1   No current facility-administered medications for this visit.    REVIEW OF SYSTEMS:   . 10 Point review of Systems was done is negative except as noted above.   PHYSICAL EXAMINATION: ECOG PERFORMANCE STATUS: 2 - Symptomatic, <50% confined to bed .BP 126/74 (BP Location: Left Arm, Patient Position: Sitting)   Pulse 90   Temp 97.8 F (36.6 C) (Oral)   Resp 16   Ht 5\' 3"  (1.6 m)   Wt 270 lb 6.4 oz (122.7 kg)   SpO2 97%   BMI 47.90 kg/m  NAD GENERAL:alert, in no acute distress and comfortable SKIN: no acute rashes, no significant lesions EYES: conjunctiva are pink and non-injected, sclera anicteric NECK: supple, no JVD LYMPH:  no palpable lymphadenopathy in the cervical, axillary or inguinal regions LUNGS: clear to auscultation b/l with normal respiratory effort HEART: regular rate & rhythm ABDOMEN:  normoactive bowel sounds , non tender, not  distended. Extremity: no pedal edema PSYCH: alert & oriented x 3 with fluent speech NEURO: no focal motor/sensory deficits  Exam performed in chair.  LABORATORY DATA:  I have reviewed the data as listed  .Aaron Aas    Latest Ref Rng & Units 05/11/2023   10:13 AM 04/13/2023   10:28 AM 03/16/2023   10:02 AM  CBC  WBC 4.0 - 10.5 K/uL 6.4  6.3  8.4   Hemoglobin 12.0 - 15.0 g/dL 44.0  10.2  72.5   Hematocrit 36.0 - 46.0 % 40.8  39.9  38.0   Platelets 150 - 400 K/uL 219  234  251     .    Latest Ref Rng & Units 05/11/2023   10:13 AM 04/13/2023   10:28 AM 03/16/2023   10:02 AM  CMP  Glucose 70 - 99 mg/dL 366  440  347   BUN 6 - 20 mg/dL 21  20  28    Creatinine 0.44 - 1.00 mg/dL 4.25  9.56  3.87   Sodium 135 - 145 mmol/L 140  140  140   Potassium 3.5 - 5.1 mmol/L 3.9  3.9  4.6   Chloride 98 - 111 mmol/L 107  107  106   CO2 22 - 32 mmol/L 25  25  27    Calcium  8.9 - 10.3 mg/dL 9.5  56.4  9.7   Total Protein 6.5 - 8.1 g/dL 7.3  7.2  7.4   Total Bilirubin 0.0 - 1.2 mg/dL 0.5  0.6  0.3   Alkaline Phos 38 - 126 U/L 60  57  62   AST 15 - 41 U/L 20  14  19    ALT 0 - 44 U/L 34  29  27     RADIOGRAPHIC STUDIES: I have personally reviewed the radiological images as listed and agreed with the findings in the report. No results found.   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  2) h/o hypercalcemia due to multiple myeloma-now resolved with IV fluids, calcitonin, pamidronate . 3) h/o anemia due to multiple myeloma becoming more apparent as the patient's hemoconcentration due to dehydration has been corrected.    4) h/o acute renal failure related to dehydration hypercalcemia and multiple myeloma.  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  PLAN: -Discussed lab results from today, 06/08/2023, in detail with the patient. CBC stable. CMP shows elevated Bun of 26, elevated but improved creatinine of 1.55, and elevated ALT of 49.  Myeloma panel -- non detectable M spike. -We will increased Gabapentin  dosage to 200 mg in the morning, 300 mg in the afternoon, and 500 mg at night to see if helps muscle cramps.  -Continue tiZANidine  2 mg.  -Patient has been tolerating her current dose of Revlimid , 5 mg, with mild toxicities of upset stomach, diarrhea, and occasional nausea in the morning.  -Patient has been tolerating her current dose of Eliquis , 2.5 mg.  -Continue Revlimid , 5 mg.  -Continue Eliquis , 2.5 mg.  -Will prescribe Tramadol . Discontinue Oxycodone .  -Recommend to follow-up with PCP regarding weight loss medication.  -Answered all of patient's questions.   FOLLOW UP: RTC with dr Salomon Cree with labs in 2 months  The total time spent in the appointment was 30 minutes* .  All of the patient's questions were answered with apparent satisfaction. The patient knows to call the clinic with any problems, questions or concerns.   Jacquelyn Matt MD MS AAHIVMS [facility] Hematology/Oncology Physician [facility]  .*Total Encounter Time as defined by the Centers for Medicare and Medicaid Services includes, in addition to the face-to-face time of a patient visit (documented in the note above) non-face-to-face time: obtaining and reviewing outside history, ordering and reviewing medications, tests or procedures, care coordination (communications with other  health care professionals or caregivers) and documentation in the medical record.   I,Param Shah,acting as a Neurosurgeon for Jacquelyn Matt, MD.,have documented all relevant documentation on the behalf of Jacquelyn Matt, MD,as directed by  Jacquelyn Matt, MD while in the presence of Jacquelyn Matt, MD.  .I have reviewed the above documentation for accuracy and completeness, and I agree with the above. .Schneider Warchol Kishore Jhonathan Desroches MD

## 2023-06-09 ENCOUNTER — Telehealth: Payer: Self-pay | Admitting: Hematology

## 2023-06-09 NOTE — Telephone Encounter (Signed)
 Left patient a vm regarding upcoming appointment

## 2023-06-10 ENCOUNTER — Other Ambulatory Visit: Payer: Self-pay

## 2023-06-11 ENCOUNTER — Other Ambulatory Visit: Payer: Medicare Other

## 2023-06-11 ENCOUNTER — Ambulatory Visit: Payer: Medicare Other | Admitting: Hematology

## 2023-06-11 ENCOUNTER — Ambulatory Visit: Admitting: Hematology

## 2023-06-11 LAB — MULTIPLE MYELOMA PANEL, SERUM
Albumin SerPl Elph-Mcnc: 3.7 g/dL (ref 2.9–4.4)
Albumin/Glob SerPl: 1.3 (ref 0.7–1.7)
Alpha 1: 0.2 g/dL (ref 0.0–0.4)
Alpha2 Glob SerPl Elph-Mcnc: 0.8 g/dL (ref 0.4–1.0)
B-Globulin SerPl Elph-Mcnc: 1.1 g/dL (ref 0.7–1.3)
Gamma Glob SerPl Elph-Mcnc: 0.8 g/dL (ref 0.4–1.8)
Globulin, Total: 2.9 g/dL (ref 2.2–3.9)
IgA: 207 mg/dL (ref 87–352)
IgG (Immunoglobin G), Serum: 905 mg/dL (ref 586–1602)
IgM (Immunoglobulin M), Srm: 13 mg/dL — ABNORMAL LOW (ref 26–217)
Total Protein ELP: 6.6 g/dL (ref 6.0–8.5)

## 2023-06-13 ENCOUNTER — Other Ambulatory Visit: Payer: Self-pay | Admitting: Hematology

## 2023-06-14 ENCOUNTER — Encounter: Payer: Self-pay | Admitting: Hematology

## 2023-06-21 ENCOUNTER — Encounter: Payer: Self-pay | Admitting: Hematology

## 2023-06-21 ENCOUNTER — Other Ambulatory Visit: Payer: Self-pay | Admitting: Hematology

## 2023-06-21 MED ORDER — GABAPENTIN 100 MG PO CAPS: ORAL_CAPSULE | ORAL | 2 refills | Status: DC

## 2023-06-21 NOTE — Progress Notes (Signed)
 Gabapentin refill

## 2023-06-23 ENCOUNTER — Other Ambulatory Visit (HOSPITAL_COMMUNITY): Payer: Self-pay | Admitting: Nurse Practitioner

## 2023-06-23 DIAGNOSIS — R051 Acute cough: Secondary | ICD-10-CM

## 2023-07-03 ENCOUNTER — Other Ambulatory Visit: Payer: Self-pay | Admitting: Hematology

## 2023-07-03 DIAGNOSIS — C9001 Multiple myeloma in remission: Secondary | ICD-10-CM

## 2023-07-04 ENCOUNTER — Other Ambulatory Visit: Payer: Self-pay | Admitting: Hematology

## 2023-07-05 ENCOUNTER — Other Ambulatory Visit: Payer: Self-pay | Admitting: Hematology

## 2023-07-05 ENCOUNTER — Other Ambulatory Visit: Payer: Self-pay

## 2023-07-05 DIAGNOSIS — C9001 Multiple myeloma in remission: Secondary | ICD-10-CM

## 2023-07-06 ENCOUNTER — Encounter: Payer: Self-pay | Admitting: Hematology

## 2023-07-06 ENCOUNTER — Inpatient Hospital Stay: Attending: Hematology

## 2023-07-06 DIAGNOSIS — C9000 Multiple myeloma not having achieved remission: Secondary | ICD-10-CM | POA: Insufficient documentation

## 2023-07-06 DIAGNOSIS — C9001 Multiple myeloma in remission: Secondary | ICD-10-CM

## 2023-07-06 LAB — CBC WITH DIFFERENTIAL (CANCER CENTER ONLY)
Abs Immature Granulocytes: 0.03 10*3/uL (ref 0.00–0.07)
Basophils Absolute: 0.1 10*3/uL (ref 0.0–0.1)
Basophils Relative: 1 %
Eosinophils Absolute: 0.6 10*3/uL — ABNORMAL HIGH (ref 0.0–0.5)
Eosinophils Relative: 8 %
HCT: 38.5 % (ref 36.0–46.0)
Hemoglobin: 13 g/dL (ref 12.0–15.0)
Immature Granulocytes: 0 %
Lymphocytes Relative: 19 %
Lymphs Abs: 1.3 10*3/uL (ref 0.7–4.0)
MCH: 32.5 pg (ref 26.0–34.0)
MCHC: 33.8 g/dL (ref 30.0–36.0)
MCV: 96.3 fL (ref 80.0–100.0)
Monocytes Absolute: 0.6 10*3/uL (ref 0.1–1.0)
Monocytes Relative: 9 %
Neutro Abs: 4.2 10*3/uL (ref 1.7–7.7)
Neutrophils Relative %: 63 %
Platelet Count: 201 10*3/uL (ref 150–400)
RBC: 4 MIL/uL (ref 3.87–5.11)
RDW: 16.3 % — ABNORMAL HIGH (ref 11.5–15.5)
WBC Count: 6.9 10*3/uL (ref 4.0–10.5)
nRBC: 0 % (ref 0.0–0.2)

## 2023-07-06 LAB — CMP (CANCER CENTER ONLY)
ALT: 54 U/L — ABNORMAL HIGH (ref 0–44)
AST: 24 U/L (ref 15–41)
Albumin: 4 g/dL (ref 3.5–5.0)
Alkaline Phosphatase: 69 U/L (ref 38–126)
Anion gap: 10 (ref 5–15)
BUN: 19 mg/dL (ref 6–20)
CO2: 24 mmol/L (ref 22–32)
Calcium: 9.4 mg/dL (ref 8.9–10.3)
Chloride: 106 mmol/L (ref 98–111)
Creatinine: 1.59 mg/dL — ABNORMAL HIGH (ref 0.44–1.00)
GFR, Estimated: 41 mL/min — ABNORMAL LOW (ref >60)
Glucose, Bld: 113 mg/dL — ABNORMAL HIGH (ref 70–99)
Potassium: 3.6 mmol/L (ref 3.5–5.1)
Sodium: 140 mmol/L (ref 135–145)
Total Bilirubin: 0.5 mg/dL (ref 0.0–1.2)
Total Protein: 6.8 g/dL (ref 6.5–8.1)

## 2023-07-06 LAB — PREGNANCY, URINE: Preg Test, Ur: NEGATIVE

## 2023-07-06 LAB — MAGNESIUM: Magnesium: 1.6 mg/dL — ABNORMAL LOW (ref 1.7–2.4)

## 2023-07-07 ENCOUNTER — Encounter: Payer: Self-pay | Admitting: Hematology

## 2023-07-09 LAB — MULTIPLE MYELOMA PANEL, SERUM
Albumin SerPl Elph-Mcnc: 3.3 g/dL (ref 2.9–4.4)
Albumin/Glob SerPl: 1.2 (ref 0.7–1.7)
Alpha 1: 0.2 g/dL (ref 0.0–0.4)
Alpha2 Glob SerPl Elph-Mcnc: 0.9 g/dL (ref 0.4–1.0)
B-Globulin SerPl Elph-Mcnc: 1.1 g/dL (ref 0.7–1.3)
Gamma Glob SerPl Elph-Mcnc: 0.6 g/dL (ref 0.4–1.8)
Globulin, Total: 2.8 g/dL (ref 2.2–3.9)
IgA: 177 mg/dL (ref 87–352)
IgG (Immunoglobin G), Serum: 742 mg/dL (ref 586–1602)
IgM (Immunoglobulin M), Srm: 13 mg/dL — ABNORMAL LOW (ref 26–217)
Total Protein ELP: 6.1 g/dL (ref 6.0–8.5)

## 2023-07-19 ENCOUNTER — Other Ambulatory Visit: Payer: Self-pay | Admitting: Hematology

## 2023-07-23 ENCOUNTER — Other Ambulatory Visit (HOSPITAL_COMMUNITY): Payer: Self-pay | Admitting: Family Medicine

## 2023-07-23 ENCOUNTER — Ambulatory Visit (HOSPITAL_COMMUNITY)
Admission: RE | Admit: 2023-07-23 | Discharge: 2023-07-23 | Disposition: A | Discharge status: Home / Self Care | Source: Ambulatory Visit | Attending: Family Medicine | Admitting: Family Medicine

## 2023-07-23 DIAGNOSIS — R062 Wheezing: Secondary | ICD-10-CM | POA: Diagnosis present

## 2023-07-29 ENCOUNTER — Other Ambulatory Visit: Payer: Self-pay | Admitting: Hematology

## 2023-07-30 ENCOUNTER — Encounter: Payer: Self-pay | Admitting: Hematology

## 2023-08-01 ENCOUNTER — Other Ambulatory Visit: Payer: Self-pay | Admitting: Hematology

## 2023-08-01 DIAGNOSIS — C9001 Multiple myeloma in remission: Secondary | ICD-10-CM

## 2023-08-02 ENCOUNTER — Other Ambulatory Visit: Payer: Self-pay

## 2023-08-02 DIAGNOSIS — C9001 Multiple myeloma in remission: Secondary | ICD-10-CM

## 2023-08-03 ENCOUNTER — Inpatient Hospital Stay: Attending: Hematology

## 2023-08-03 ENCOUNTER — Other Ambulatory Visit: Payer: Self-pay | Admitting: Hematology

## 2023-08-03 ENCOUNTER — Encounter: Payer: Self-pay | Admitting: Hematology

## 2023-08-03 DIAGNOSIS — C9000 Multiple myeloma not having achieved remission: Secondary | ICD-10-CM | POA: Diagnosis present

## 2023-08-03 DIAGNOSIS — C9001 Multiple myeloma in remission: Secondary | ICD-10-CM

## 2023-08-03 LAB — CBC WITH DIFFERENTIAL (CANCER CENTER ONLY)
Abs Immature Granulocytes: 0.09 10*3/uL — ABNORMAL HIGH (ref 0.00–0.07)
Basophils Absolute: 0.1 10*3/uL (ref 0.0–0.1)
Basophils Relative: 0 %
Eosinophils Absolute: 0.5 10*3/uL (ref 0.0–0.5)
Eosinophils Relative: 4 %
HCT: 38.5 % (ref 36.0–46.0)
Hemoglobin: 13.1 g/dL (ref 12.0–15.0)
Immature Granulocytes: 1 %
Lymphocytes Relative: 18 %
Lymphs Abs: 2 10*3/uL (ref 0.7–4.0)
MCH: 32.9 pg (ref 26.0–34.0)
MCHC: 34 g/dL (ref 30.0–36.0)
MCV: 96.7 fL (ref 80.0–100.0)
Monocytes Absolute: 1.4 10*3/uL — ABNORMAL HIGH (ref 0.1–1.0)
Monocytes Relative: 13 %
Neutro Abs: 7.2 10*3/uL (ref 1.7–7.7)
Neutrophils Relative %: 64 %
Platelet Count: 221 10*3/uL (ref 150–400)
RBC: 3.98 MIL/uL (ref 3.87–5.11)
RDW: 17.8 % — ABNORMAL HIGH (ref 11.5–15.5)
WBC Count: 11.3 10*3/uL — ABNORMAL HIGH (ref 4.0–10.5)
nRBC: 0.2 % (ref 0.0–0.2)

## 2023-08-03 LAB — CMP (CANCER CENTER ONLY)
ALT: 56 U/L — ABNORMAL HIGH (ref 0–44)
AST: 17 U/L (ref 15–41)
Albumin: 4.1 g/dL (ref 3.5–5.0)
Alkaline Phosphatase: 50 U/L (ref 38–126)
Anion gap: 9 (ref 5–15)
BUN: 45 mg/dL — ABNORMAL HIGH (ref 6–20)
CO2: 26 mmol/L (ref 22–32)
Calcium: 9.4 mg/dL (ref 8.9–10.3)
Chloride: 105 mmol/L (ref 98–111)
Creatinine: 1.79 mg/dL — ABNORMAL HIGH (ref 0.44–1.00)
GFR, Estimated: 35 mL/min — ABNORMAL LOW (ref >60)
Glucose, Bld: 94 mg/dL (ref 70–99)
Potassium: 4 mmol/L (ref 3.5–5.1)
Sodium: 140 mmol/L (ref 135–145)
Total Bilirubin: 0.8 mg/dL (ref 0.0–1.2)
Total Protein: 6.9 g/dL (ref 6.5–8.1)

## 2023-08-03 LAB — MAGNESIUM: Magnesium: 2 mg/dL (ref 1.7–2.4)

## 2023-08-03 LAB — PREGNANCY, URINE: Preg Test, Ur: NEGATIVE

## 2023-08-05 ENCOUNTER — Ambulatory Visit
Admission: RE | Admit: 2023-08-05 | Discharge: 2023-08-05 | Disposition: A | Discharge status: Home / Self Care | Source: Ambulatory Visit | Attending: Obstetrics and Gynecology | Admitting: Obstetrics and Gynecology

## 2023-08-05 ENCOUNTER — Encounter: Payer: Self-pay | Admitting: Hematology

## 2023-08-05 DIAGNOSIS — M7989 Other specified soft tissue disorders: Secondary | ICD-10-CM

## 2023-08-05 LAB — MULTIPLE MYELOMA PANEL, SERUM
Albumin SerPl Elph-Mcnc: 3.3 g/dL (ref 2.9–4.4)
Albumin/Glob SerPl: 1.2 (ref 0.7–1.7)
Alpha 1: 0.3 g/dL (ref 0.0–0.4)
Alpha2 Glob SerPl Elph-Mcnc: 0.9 g/dL (ref 0.4–1.0)
B-Globulin SerPl Elph-Mcnc: 1.1 g/dL (ref 0.7–1.3)
Gamma Glob SerPl Elph-Mcnc: 0.7 g/dL (ref 0.4–1.8)
Globulin, Total: 3 g/dL (ref 2.2–3.9)
IgA: 177 mg/dL (ref 87–352)
IgG (Immunoglobin G), Serum: 649 mg/dL (ref 586–1602)
IgM (Immunoglobulin M), Srm: 14 mg/dL — ABNORMAL LOW (ref 26–217)
Total Protein ELP: 6.3 g/dL (ref 6.0–8.5)

## 2023-08-13 ENCOUNTER — Other Ambulatory Visit (HOSPITAL_COMMUNITY): Payer: Self-pay | Admitting: Radiology

## 2023-08-13 DIAGNOSIS — R0600 Dyspnea, unspecified: Secondary | ICD-10-CM

## 2023-08-28 ENCOUNTER — Other Ambulatory Visit: Payer: Self-pay | Admitting: Hematology

## 2023-08-28 DIAGNOSIS — C9001 Multiple myeloma in remission: Secondary | ICD-10-CM

## 2023-08-30 ENCOUNTER — Other Ambulatory Visit: Payer: Self-pay

## 2023-08-30 ENCOUNTER — Other Ambulatory Visit: Payer: Self-pay | Admitting: Physician Assistant

## 2023-08-30 DIAGNOSIS — C9001 Multiple myeloma in remission: Secondary | ICD-10-CM

## 2023-08-31 ENCOUNTER — Inpatient Hospital Stay: Attending: Hematology

## 2023-08-31 ENCOUNTER — Encounter: Payer: Self-pay | Admitting: Hematology

## 2023-08-31 DIAGNOSIS — Z801 Family history of malignant neoplasm of trachea, bronchus and lung: Secondary | ICD-10-CM | POA: Diagnosis not present

## 2023-08-31 DIAGNOSIS — C9001 Multiple myeloma in remission: Secondary | ICD-10-CM

## 2023-08-31 DIAGNOSIS — G8929 Other chronic pain: Secondary | ICD-10-CM | POA: Diagnosis not present

## 2023-08-31 DIAGNOSIS — Z800 Family history of malignant neoplasm of digestive organs: Secondary | ICD-10-CM | POA: Insufficient documentation

## 2023-08-31 DIAGNOSIS — M549 Dorsalgia, unspecified: Secondary | ICD-10-CM | POA: Diagnosis not present

## 2023-08-31 DIAGNOSIS — C9000 Multiple myeloma not having achieved remission: Secondary | ICD-10-CM | POA: Diagnosis present

## 2023-08-31 DIAGNOSIS — Z87891 Personal history of nicotine dependence: Secondary | ICD-10-CM | POA: Diagnosis not present

## 2023-08-31 LAB — CBC WITH DIFFERENTIAL (CANCER CENTER ONLY)
Abs Immature Granulocytes: 0.04 K/uL (ref 0.00–0.07)
Basophils Absolute: 0.1 K/uL (ref 0.0–0.1)
Basophils Relative: 1 %
Eosinophils Absolute: 0.4 K/uL (ref 0.0–0.5)
Eosinophils Relative: 5 %
HCT: 39.1 % (ref 36.0–46.0)
Hemoglobin: 13 g/dL (ref 12.0–15.0)
Immature Granulocytes: 1 %
Lymphocytes Relative: 16 %
Lymphs Abs: 1.3 K/uL (ref 0.7–4.0)
MCH: 33.8 pg (ref 26.0–34.0)
MCHC: 33.2 g/dL (ref 30.0–36.0)
MCV: 101.6 fL — ABNORMAL HIGH (ref 80.0–100.0)
Monocytes Absolute: 0.6 K/uL (ref 0.1–1.0)
Monocytes Relative: 7 %
Neutro Abs: 5.9 K/uL (ref 1.7–7.7)
Neutrophils Relative %: 70 %
Platelet Count: 194 K/uL (ref 150–400)
RBC: 3.85 MIL/uL — ABNORMAL LOW (ref 3.87–5.11)
RDW: 17.4 % — ABNORMAL HIGH (ref 11.5–15.5)
WBC Count: 8.3 K/uL (ref 4.0–10.5)
nRBC: 0 % (ref 0.0–0.2)

## 2023-08-31 LAB — CMP (CANCER CENTER ONLY)
ALT: 26 U/L (ref 0–44)
AST: 13 U/L — ABNORMAL LOW (ref 15–41)
Albumin: 4 g/dL (ref 3.5–5.0)
Alkaline Phosphatase: 62 U/L (ref 38–126)
Anion gap: 6 (ref 5–15)
BUN: 18 mg/dL (ref 6–20)
CO2: 28 mmol/L (ref 22–32)
Calcium: 9.4 mg/dL (ref 8.9–10.3)
Chloride: 108 mmol/L (ref 98–111)
Creatinine: 1.5 mg/dL — ABNORMAL HIGH (ref 0.44–1.00)
GFR, Estimated: 44 mL/min — ABNORMAL LOW (ref >60)
Glucose, Bld: 95 mg/dL (ref 70–99)
Potassium: 3.5 mmol/L (ref 3.5–5.1)
Sodium: 142 mmol/L (ref 135–145)
Total Bilirubin: 0.3 mg/dL (ref 0.0–1.2)
Total Protein: 6.8 g/dL (ref 6.5–8.1)

## 2023-08-31 LAB — PREGNANCY, URINE: Preg Test, Ur: NEGATIVE

## 2023-08-31 LAB — MAGNESIUM: Magnesium: 1.6 mg/dL — ABNORMAL LOW (ref 1.7–2.4)

## 2023-09-03 LAB — MULTIPLE MYELOMA PANEL, SERUM
Albumin SerPl Elph-Mcnc: 3.6 g/dL (ref 2.9–4.4)
Albumin/Glob SerPl: 1.4 (ref 0.7–1.7)
Alpha 1: 0.3 g/dL (ref 0.0–0.4)
Alpha2 Glob SerPl Elph-Mcnc: 0.8 g/dL (ref 0.4–1.0)
B-Globulin SerPl Elph-Mcnc: 1.1 g/dL (ref 0.7–1.3)
Gamma Glob SerPl Elph-Mcnc: 0.5 g/dL (ref 0.4–1.8)
Globulin, Total: 2.6 g/dL (ref 2.2–3.9)
IgA: 187 mg/dL (ref 87–352)
IgG (Immunoglobin G), Serum: 599 mg/dL (ref 586–1602)
IgM (Immunoglobulin M), Srm: 13 mg/dL — ABNORMAL LOW (ref 26–217)
Total Protein ELP: 6.2 g/dL (ref 6.0–8.5)

## 2023-09-05 ENCOUNTER — Other Ambulatory Visit: Payer: Self-pay | Admitting: Hematology

## 2023-09-06 ENCOUNTER — Encounter: Payer: Self-pay | Admitting: Hematology

## 2023-09-07 NOTE — Progress Notes (Signed)
 HEMATOLOGY/ONCOLOGY CLINIC NOTE  Date of Service: 09/08/2023  Patient Care Team: Shona Norleen PEDLAR, MD as PCP - General (Internal Medicine) Alona Corners, DO (Inactive) as Consulting Physician (Interventional Radiology)  CHIEF COMPLAINTS/PURPOSE OF CONSULTATION:  Follow-up for continued evaluation management of myeloma  HISTORY OF PRESENTING ILLNESS:  Please see previous note for HPI.  INTERVAL HISTORY:  Stacey Montoya is a 48 y.o. female here for continued valuation and management of multiple myeloma.   Patient was last seen by me on 06/08/2023 and complained of mild upset stomach, mild diarrhea, occasional nausea in the morning, bilateral leg muscle cramps, which affects her sleep, and chronic lower back pain.   Patient is accompanied by her mother during today's visit. She denies any new concerns since her last clinical visit.   Patient notes that summer heat has been bothersome.   She is noted to have chronic back pain. Patient reports stable discomfort when standing for extended periods such as with cooking. She notes that she switched to Tramadol , which is fairly effective. She denies any other new bone pain.   Patient has been tolerating her low-dose revlimid  with no major toxicities. She reports stable diarrhea 2-3x a day, which is unchanged. Patient sometimes needs to use imodium. On average, she has daily very soft/loose bowels. Her diarrhea is not significantly limiting. Patient notes that her diarrhea starts to improve during her week off of revlimid .  She reports that she is staying very well-hydrated. Patient notes that she has been drinking diet green tea which she reports is okay with her nephrologist.   She notes that she has been taken off of lasix  since Aug 2024 in the hospital. Patient notes that she has not worn her compression socks in the last 3 months. She denies any leg swelling at this time.    Patient is taking Vitamin D  supplements.   Patient reports  that she enjoys swimming. She notes that certain swim strokes can cause back discomfort.   MEDICAL HISTORY:  Past Medical History:  Diagnosis Date   Abnormal Pap smear of cervix    Back pain    Cancer (HCC) 04/02/2020   multiple myeloma   Hyperlipidemia    Hypertension     SURGICAL HISTORY: Past Surgical History:  Procedure Laterality Date   CERVICAL CONIZATION W/BX  12/02/2010   Procedure: CONIZATION CERVIX WITH BIOPSY;  Surgeon: Bobie DELENA Cary;  Location: [facility];  Service: Gynecology;  Laterality: N/A;  COLD KNIFE   CESAREAN SECTION     DILATION AND CURETTAGE OF UTERUS  12/02/2010   Procedure: DILATATION AND CURETTAGE (D&C);  Surgeon: Bobie DELENA Cary;  Location: [facility];  Service: Gynecology;  Laterality: N/A;   IR RADIOLOGIST EVAL & MGMT  04/16/2020   KYPHOPLASTY Bilateral 09/05/2020   Procedure: KYPHOPLASTY THORACIC TWELVE AND LUMBAR FOUR;  Surgeon: Lanis Pupa, MD;  Location: [facility];  Service: Neurosurgery;  Laterality: Bilateral;   left hand     drain - infection   WISDOM TOOTH EXTRACTION      SOCIAL HISTORY: Social History   Socioeconomic History   Marital status: Married    Spouse name: Not on file   Number of children: Not on file   Years of education: Not on file   Highest education level: Not on file  Occupational History   Not on file  Tobacco Use   Smoking status: Former    Current packs/day: 0.50    Average packs/day: 0.5 packs/day for 20.0 years (10.0 ttl  pk-yrs)    Types: Cigarettes   Smokeless tobacco: Never  Vaping Use   Vaping status: Some Days  Substance and Sexual Activity   Alcohol use: Not Currently   Drug use: Not Currently   Sexual activity: Not Currently    Partners: Male    Birth control/protection: Condom  Other Topics Concern   Not on file  Social History Narrative   ** Merged History Encounter **       Social Drivers of Health   Financial Resource Strain: Not on file  Food Insecurity: No Food Insecurity (10/05/2022)   Hunger  Vital Sign    Worried About Running Out of Food in the Last Year: Never true    Ran Out of Food in the Last Year: Never true  Transportation Needs: No Transportation Needs (10/05/2022)   PRAPARE - Administrator, Civil Service (Medical): No    Lack of Transportation (Non-Medical): No  Physical Activity: Not on file  Stress: Not on file  Social Connections: Not on file  Intimate Partner Violence: Not At Risk (10/05/2022)   Humiliation, Afraid, Rape, and Kick questionnaire    Fear of Current or Ex-Partner: No    Emotionally Abused: No    Physically Abused: No    Sexually Abused: No    FAMILY HISTORY: Family History  Problem Relation Age of Onset   Lung cancer Maternal Grandfather    Pancreatic cancer Paternal Grandfather     ALLERGIES:  is allergic to duloxetine  hcl and zithromax [azithromycin].  MEDICATIONS:  Current Outpatient Medications  Medication Sig Dispense Refill   acetaminophen  (TYLENOL ) 500 MG tablet Take 2 tablets (1,000 mg total) by mouth in the morning and at bedtime.     acyclovir  (ZOVIRAX ) 400 MG tablet TAKE (1) TABLET BY MOUTH TWICE DAILY. 60 tablet 5   albuterol  (VENTOLIN  HFA) 108 (90 Base) MCG/ACT inhaler Inhale 1-2 puffs into the lungs every 6 (six) hours as needed for wheezing.     amLODipine  (NORVASC ) 5 MG tablet Take 1 tablet (5 mg total) by mouth daily. HOLD until follow-up appointment with your PCP.     b complex vitamins capsule Take 1 capsule by mouth daily. 30 capsule    docusate sodium  (COLACE) 100 MG capsule Take 1 capsule (100 mg total) by mouth 2 (two) times daily. Also available OTC. 60 capsule 0   ELIQUIS  2.5 MG TABS tablet TAKE ONE TABLET BY MOUTH TWICE DAILY 60 tablet 5   ergocalciferol  (VITAMIN D2) 1.25 MG (50000 UT) capsule Take 1 capsule (50,000 Units total) by mouth once a week.     gabapentin  (NEURONTIN ) 100 MG capsule 2 cap (200mg ) in the morning at 8AM, 3 caps (300mg )at noon and 5 caps (500mg ) at bedtime 300 capsule 2    lenalidomide  (REVLIMID ) 5 MG capsule TAKE 1 CAPSULE BY MOUTH DAILY  FOR 21 DAYS, THEN 7 DAYS OFF 21 capsule 0   medroxyPROGESTERone  (PROVERA ) 10 MG tablet Take 10 mg by mouth daily.     Melatonin 10 MG TABS Take 20 mg by mouth at bedtime.     metoprolol  tartrate (LOPRESSOR ) 50 MG tablet Take 1 tablet (50 mg total) by mouth 2 (two) times daily. 60 tablet 3   omeprazole (PRILOSEC) 20 MG capsule Take 20 mg by mouth daily.     ondansetron  (ZOFRAN ) 4 MG tablet Take 2 tablets (8 mg total) by mouth every 8 (eight) hours as needed for nausea or vomiting. 30 tablet 0   polyethylene glycol (MIRALAX  / GLYCOLAX )  17 g packet Take 17 g by mouth daily as needed for mild constipation.     pravastatin  (PRAVACHOL ) 40 MG tablet Take 40 mg by mouth daily.     prochlorperazine  (COMPAZINE ) 10 MG tablet Take 1 tablet (10 mg total) by mouth every 6 (six) hours as needed for nausea or vomiting. 30 tablet 2   tiZANidine  (ZANAFLEX ) 2 MG tablet TAKE ONE TABLET BY MOUTH TWICE DAILY AS NEEDED FOR MUSCLE SPASMS 30 tablet 1   traMADol  (ULTRAM ) 50 MG tablet Take 1 tablet (50 mg total) by mouth every 6 (six) hours as needed. 60 tablet 0   No current facility-administered medications for this visit.    REVIEW OF SYSTEMS:   . 10 Point review of Systems was done is negative except as noted above.   PHYSICAL EXAMINATION: ECOG PERFORMANCE STATUS: 2 - Symptomatic, <50% confined to bed .BP 119/78   Pulse 89   Temp 97.9 F (36.6 C)   Resp 20   Wt 261 lb 11.2 oz (118.7 kg)   SpO2 95%   BMI 46.36 kg/m   GENERAL:alert, in no acute distress and comfortable SKIN: no acute rashes, no significant lesions EYES: conjunctiva are pink and non-injected, sclera anicteric OROPHARYNX: MMM, no exudates, no oropharyngeal erythema or ulceration NECK: supple, no JVD LYMPH:  no palpable lymphadenopathy in the cervical, axillary or inguinal regions LUNGS: clear to auscultation b/l with normal respiratory effort HEART: regular rate &  rhythm ABDOMEN:  normoactive bowel sounds , non tender, not distended. Extremity: no pedal edema PSYCH: alert & oriented x 3 with fluent speech NEURO: no focal motor/sensory deficits   LABORATORY DATA:  I have reviewed the data as listed  .SABRA    Latest Ref Rng & Units 08/31/2023   11:27 AM 08/03/2023   11:03 AM 07/06/2023    9:42 AM  CBC  WBC 4.0 - 10.5 K/uL 8.3  11.3  6.9   Hemoglobin 12.0 - 15.0 g/dL 86.9  86.8  86.9   Hematocrit 36.0 - 46.0 % 39.1  38.5  38.5   Platelets 150 - 400 K/uL 194  221  201     .    Latest Ref Rng & Units 08/31/2023   11:27 AM 08/03/2023   11:03 AM 07/06/2023    9:42 AM  CMP  Glucose 70 - 99 mg/dL 95  94  886   BUN 6 - 20 mg/dL 18  45  19   Creatinine 0.44 - 1.00 mg/dL 8.49  8.20  8.40   Sodium 135 - 145 mmol/L 142  140  140   Potassium 3.5 - 5.1 mmol/L 3.5  4.0  3.6   Chloride 98 - 111 mmol/L 108  105  106   CO2 22 - 32 mmol/L 28  26  24    Calcium  8.9 - 10.3 mg/dL 9.4  9.4  9.4   Total Protein 6.5 - 8.1 g/dL 6.8  6.9  6.8   Total Bilirubin 0.0 - 1.2 mg/dL 0.3  0.8  0.5   Alkaline Phos 38 - 126 U/L 62  50  69   AST 15 - 41 U/L 13  17  24    ALT 0 - 44 U/L 26  56  54     RADIOGRAPHIC STUDIES: I have personally reviewed the radiological images as listed and agreed with the findings in the report. No results found.   04/03/2020 Cytogenetics Report   04/03/2020 Molecular Pathology FISH Analysis   ASSESSMENT & PLAN:    48  y.o. 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  2) h/o hypercalcemia due to multiple myeloma-now resolved with IV fluids, calcitonin, pamidronate . 3) h/o anemia due to multiple myeloma becoming more apparent as the patient's hemoconcentration due to  dehydration has been corrected.   4) h/o acute renal failure related to dehydration hypercalcemia and multiple myeloma.  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  PLAN:  -Discussed lab results from 08/31/2023 in detail with patient. CBC normal, showed WBC of 8.3K, hemoglobin of 13, and platelets of 194K. -CMP shows stable CKD -creatinine improved to 1.5 mg/dL -electrolytes normal -myeloma labs shows no detectable M protein and patient continues to be in remission  -magnesium  low-normal, likely from acid suppressants -discussed that Revlimid  can cause some low potassium, though not necessarily magnesium  -discussed that if she has muscle cramps, she could choose to take low-dose magnesium , SlowMag, or optimize diet with magnesium -rich foods to improve her energy levels and support muscle activity  -patient reports mild diarrhea, which is not significantly limiting -Continue to use imodium as needed for diarrhea -patient denies any major toxicities with maintenance revlimid  5 MG -Continue maintenance Revlimid  5 MG at current dose at this time -recommend staying physically active -continue Tramadol  50 MG -discussed option to use orthopedic appropriate foot wear with memory foam inside the home with hardwood flooring to improve her pain -she can wear compression socks once in a while as needed such as when traveling long distance  -continue Eliquis , 2.5 mg.  -discussed option of drinking organic brands of green tea -will continue to monitor her potassium and magnesium  levels -she shall return to clinic in 3 months -answered all of patient's questions in detail  FOLLOW UP: Labs in 12 weeks RTC with Dr Onesimo in 14 weeks  The total time spent in  the appointment was 30 minutes* .  All of the patient's questions were answered with apparent satisfaction. The patient knows to call the clinic with any problems, questions or concerns.   Emaline Onesimo MD MS AAHIVMS [facility] Hematology/Oncology Physician [facility]  .*Total Encounter Time as defined by the Centers for Medicare and Medicaid Services includes, in addition to the face-to-face time of a patient visit (documented in the note above) non-face-to-face time: obtaining and reviewing outside history, ordering and reviewing medications, tests or procedures, care coordination (communications with other health care professionals or caregivers) and documentation in the medical record.    I,Mitra Faeizi,acting as a Neurosurgeon for Emaline Onesimo, MD.,have documented all relevant documentation on the behalf of Emaline Onesimo, MD,as directed by  Emaline Onesimo, MD while in the presence of Emaline Onesimo, MD.  .I have reviewed the above documentation for accuracy and completeness, and I agree with the above. .Margia Wiesen Kishore Itzia Cunliffe MD

## 2023-09-08 ENCOUNTER — Other Ambulatory Visit

## 2023-09-08 ENCOUNTER — Inpatient Hospital Stay (HOSPITAL_BASED_OUTPATIENT_CLINIC_OR_DEPARTMENT_OTHER): Admitting: Hematology

## 2023-09-08 VITALS — BP 119/78 | HR 89 | Temp 97.9°F | Resp 20 | Wt 261.7 lb

## 2023-09-08 DIAGNOSIS — C9001 Multiple myeloma in remission: Secondary | ICD-10-CM

## 2023-09-08 DIAGNOSIS — C7951 Secondary malignant neoplasm of bone: Secondary | ICD-10-CM

## 2023-09-08 DIAGNOSIS — C9000 Multiple myeloma not having achieved remission: Secondary | ICD-10-CM | POA: Diagnosis not present

## 2023-09-10 ENCOUNTER — Other Ambulatory Visit: Payer: Self-pay

## 2023-09-15 ENCOUNTER — Encounter: Payer: Self-pay | Admitting: Hematology

## 2023-09-25 ENCOUNTER — Other Ambulatory Visit: Payer: Self-pay | Admitting: Hematology

## 2023-09-25 DIAGNOSIS — C9001 Multiple myeloma in remission: Secondary | ICD-10-CM

## 2023-09-26 ENCOUNTER — Other Ambulatory Visit: Payer: Self-pay | Admitting: Hematology

## 2023-09-27 ENCOUNTER — Other Ambulatory Visit: Payer: Self-pay

## 2023-09-27 DIAGNOSIS — C7951 Secondary malignant neoplasm of bone: Secondary | ICD-10-CM

## 2023-09-27 DIAGNOSIS — C9001 Multiple myeloma in remission: Secondary | ICD-10-CM

## 2023-09-28 ENCOUNTER — Inpatient Hospital Stay: Attending: Hematology

## 2023-09-28 ENCOUNTER — Encounter: Payer: Self-pay | Admitting: Hematology

## 2023-09-28 DIAGNOSIS — C9000 Multiple myeloma not having achieved remission: Secondary | ICD-10-CM | POA: Insufficient documentation

## 2023-09-28 DIAGNOSIS — C7951 Secondary malignant neoplasm of bone: Secondary | ICD-10-CM

## 2023-09-28 DIAGNOSIS — C9001 Multiple myeloma in remission: Secondary | ICD-10-CM

## 2023-09-28 LAB — CBC WITH DIFFERENTIAL (CANCER CENTER ONLY)
Abs Immature Granulocytes: 0.01 K/uL (ref 0.00–0.07)
Basophils Absolute: 0 K/uL (ref 0.0–0.1)
Basophils Relative: 1 %
Eosinophils Absolute: 0.5 K/uL (ref 0.0–0.5)
Eosinophils Relative: 8 %
HCT: 40.2 % (ref 36.0–46.0)
Hemoglobin: 13.5 g/dL (ref 12.0–15.0)
Immature Granulocytes: 0 %
Lymphocytes Relative: 15 %
Lymphs Abs: 0.9 K/uL (ref 0.7–4.0)
MCH: 34.2 pg — ABNORMAL HIGH (ref 26.0–34.0)
MCHC: 33.6 g/dL (ref 30.0–36.0)
MCV: 101.8 fL — ABNORMAL HIGH (ref 80.0–100.0)
Monocytes Absolute: 0.6 K/uL (ref 0.1–1.0)
Monocytes Relative: 10 %
Neutro Abs: 4.2 K/uL (ref 1.7–7.7)
Neutrophils Relative %: 66 %
Platelet Count: 216 K/uL (ref 150–400)
RBC: 3.95 MIL/uL (ref 3.87–5.11)
RDW: 17.3 % — ABNORMAL HIGH (ref 11.5–15.5)
WBC Count: 6.3 K/uL (ref 4.0–10.5)
nRBC: 0 % (ref 0.0–0.2)

## 2023-09-28 LAB — CMP (CANCER CENTER ONLY)
ALT: 56 U/L — ABNORMAL HIGH (ref 0–44)
AST: 31 U/L (ref 15–41)
Albumin: 4.2 g/dL (ref 3.5–5.0)
Alkaline Phosphatase: 70 U/L (ref 38–126)
Anion gap: 9 (ref 5–15)
BUN: 24 mg/dL — ABNORMAL HIGH (ref 6–20)
CO2: 24 mmol/L (ref 22–32)
Calcium: 9.6 mg/dL (ref 8.9–10.3)
Chloride: 105 mmol/L (ref 98–111)
Creatinine: 1.48 mg/dL — ABNORMAL HIGH (ref 0.44–1.00)
GFR, Estimated: 44 mL/min — ABNORMAL LOW (ref >60)
Glucose, Bld: 102 mg/dL — ABNORMAL HIGH (ref 70–99)
Potassium: 4 mmol/L (ref 3.5–5.1)
Sodium: 138 mmol/L (ref 135–145)
Total Bilirubin: 0.5 mg/dL (ref 0.0–1.2)
Total Protein: 7 g/dL (ref 6.5–8.1)

## 2023-09-28 LAB — MAGNESIUM: Magnesium: 1.7 mg/dL (ref 1.7–2.4)

## 2023-09-28 LAB — PREGNANCY, URINE: Preg Test, Ur: NEGATIVE

## 2023-09-30 ENCOUNTER — Other Ambulatory Visit: Payer: Self-pay

## 2023-09-30 LAB — MULTIPLE MYELOMA PANEL, SERUM
Albumin SerPl Elph-Mcnc: 3.4 g/dL (ref 2.9–4.4)
Albumin/Glob SerPl: 1.1 (ref 0.7–1.7)
Alpha 1: 0.3 g/dL (ref 0.0–0.4)
Alpha2 Glob SerPl Elph-Mcnc: 0.9 g/dL (ref 0.4–1.0)
B-Globulin SerPl Elph-Mcnc: 1.2 g/dL (ref 0.7–1.3)
Gamma Glob SerPl Elph-Mcnc: 0.7 g/dL (ref 0.4–1.8)
Globulin, Total: 3.1 g/dL (ref 2.2–3.9)
IgA: 205 mg/dL (ref 87–352)
IgG (Immunoglobin G), Serum: 689 mg/dL (ref 586–1602)
IgM (Immunoglobulin M), Srm: 14 mg/dL — ABNORMAL LOW (ref 26–217)
Total Protein ELP: 6.5 g/dL (ref 6.0–8.5)

## 2023-09-30 MED ORDER — TRAMADOL HCL 50 MG PO TABS: 50.0000 mg | ORAL_TABLET | Freq: Four times a day (QID) | ORAL | 0 refills | Status: DC | PRN

## 2023-10-23 ENCOUNTER — Other Ambulatory Visit: Payer: Self-pay | Admitting: Hematology

## 2023-10-23 DIAGNOSIS — C9001 Multiple myeloma in remission: Secondary | ICD-10-CM

## 2023-10-25 ENCOUNTER — Other Ambulatory Visit: Payer: Self-pay

## 2023-10-25 DIAGNOSIS — C9001 Multiple myeloma in remission: Secondary | ICD-10-CM

## 2023-10-26 ENCOUNTER — Inpatient Hospital Stay: Attending: Hematology

## 2023-10-26 DIAGNOSIS — C9001 Multiple myeloma in remission: Secondary | ICD-10-CM | POA: Insufficient documentation

## 2023-10-26 LAB — CBC WITH DIFFERENTIAL (CANCER CENTER ONLY)
Abs Immature Granulocytes: 0.01 K/uL (ref 0.00–0.07)
Basophils Absolute: 0 K/uL (ref 0.0–0.1)
Basophils Relative: 1 %
Eosinophils Absolute: 0.4 K/uL (ref 0.0–0.5)
Eosinophils Relative: 7 %
HCT: 39.8 % (ref 36.0–46.0)
Hemoglobin: 13.4 g/dL (ref 12.0–15.0)
Immature Granulocytes: 0 %
Lymphocytes Relative: 23 %
Lymphs Abs: 1.3 K/uL (ref 0.7–4.0)
MCH: 34 pg (ref 26.0–34.0)
MCHC: 33.7 g/dL (ref 30.0–36.0)
MCV: 101 fL — ABNORMAL HIGH (ref 80.0–100.0)
Monocytes Absolute: 0.6 K/uL (ref 0.1–1.0)
Monocytes Relative: 10 %
Neutro Abs: 3.2 K/uL (ref 1.7–7.7)
Neutrophils Relative %: 59 %
Platelet Count: 237 K/uL (ref 150–400)
RBC: 3.94 MIL/uL (ref 3.87–5.11)
RDW: 16 % — ABNORMAL HIGH (ref 11.5–15.5)
WBC Count: 5.5 K/uL (ref 4.0–10.5)
nRBC: 0 % (ref 0.0–0.2)

## 2023-10-26 LAB — CMP (CANCER CENTER ONLY)
ALT: 37 U/L (ref 0–44)
AST: 21 U/L (ref 15–41)
Albumin: 4.3 g/dL (ref 3.5–5.0)
Alkaline Phosphatase: 61 U/L (ref 38–126)
Anion gap: 9 (ref 5–15)
BUN: 25 mg/dL — ABNORMAL HIGH (ref 6–20)
CO2: 24 mmol/L (ref 22–32)
Calcium: 9.6 mg/dL (ref 8.9–10.3)
Chloride: 108 mmol/L (ref 98–111)
Creatinine: 1.47 mg/dL — ABNORMAL HIGH (ref 0.44–1.00)
GFR, Estimated: 44 mL/min — ABNORMAL LOW (ref >60)
Glucose, Bld: 96 mg/dL (ref 70–99)
Potassium: 3.6 mmol/L (ref 3.5–5.1)
Sodium: 141 mmol/L (ref 135–145)
Total Bilirubin: 0.4 mg/dL (ref 0.0–1.2)
Total Protein: 7.3 g/dL (ref 6.5–8.1)

## 2023-10-26 LAB — PREGNANCY, URINE: Preg Test, Ur: NEGATIVE

## 2023-10-27 ENCOUNTER — Encounter: Payer: Self-pay | Admitting: Hematology

## 2023-10-27 ENCOUNTER — Other Ambulatory Visit: Payer: Self-pay

## 2023-10-27 DIAGNOSIS — C9001 Multiple myeloma in remission: Secondary | ICD-10-CM

## 2023-10-29 ENCOUNTER — Encounter: Payer: Self-pay | Admitting: Hematology

## 2023-10-29 ENCOUNTER — Other Ambulatory Visit: Payer: Self-pay

## 2023-10-29 MED ORDER — TRAMADOL HCL 50 MG PO TABS: 50.0000 mg | ORAL_TABLET | Freq: Four times a day (QID) | ORAL | 0 refills | Status: DC | PRN

## 2023-10-29 MED ORDER — TIZANIDINE HCL 2 MG PO TABS: 2.0000 mg | ORAL_TABLET | Freq: Two times a day (BID) | ORAL | 1 refills | Status: DC | PRN

## 2023-11-18 ENCOUNTER — Encounter: Payer: Self-pay | Admitting: Hematology

## 2023-11-20 ENCOUNTER — Other Ambulatory Visit: Payer: Self-pay | Admitting: Hematology

## 2023-11-20 DIAGNOSIS — C9001 Multiple myeloma in remission: Secondary | ICD-10-CM

## 2023-11-22 ENCOUNTER — Other Ambulatory Visit: Payer: Self-pay

## 2023-11-22 DIAGNOSIS — C9001 Multiple myeloma in remission: Secondary | ICD-10-CM

## 2023-11-23 ENCOUNTER — Inpatient Hospital Stay: Attending: Hematology

## 2023-11-23 DIAGNOSIS — C9001 Multiple myeloma in remission: Secondary | ICD-10-CM | POA: Diagnosis present

## 2023-11-23 LAB — CBC WITH DIFFERENTIAL (CANCER CENTER ONLY)
Abs Immature Granulocytes: 0.02 K/uL (ref 0.00–0.07)
Basophils Absolute: 0.1 K/uL (ref 0.0–0.1)
Basophils Relative: 1 %
Eosinophils Absolute: 0.3 K/uL (ref 0.0–0.5)
Eosinophils Relative: 5 %
HCT: 42.3 % (ref 36.0–46.0)
Hemoglobin: 14.3 g/dL (ref 12.0–15.0)
Immature Granulocytes: 0 %
Lymphocytes Relative: 18 %
Lymphs Abs: 1.2 K/uL (ref 0.7–4.0)
MCH: 33.7 pg (ref 26.0–34.0)
MCHC: 33.8 g/dL (ref 30.0–36.0)
MCV: 99.8 fL (ref 80.0–100.0)
Monocytes Absolute: 0.8 K/uL (ref 0.1–1.0)
Monocytes Relative: 12 %
Neutro Abs: 4 K/uL (ref 1.7–7.7)
Neutrophils Relative %: 64 %
Platelet Count: 230 K/uL (ref 150–400)
RBC: 4.24 MIL/uL (ref 3.87–5.11)
RDW: 15.4 % (ref 11.5–15.5)
WBC Count: 6.4 K/uL (ref 4.0–10.5)
nRBC: 0 % (ref 0.0–0.2)

## 2023-11-23 LAB — CMP (CANCER CENTER ONLY)
ALT: 28 U/L (ref 0–44)
AST: 19 U/L (ref 15–41)
Albumin: 4.3 g/dL (ref 3.5–5.0)
Alkaline Phosphatase: 57 U/L (ref 38–126)
Anion gap: 9 (ref 5–15)
BUN: 24 mg/dL — ABNORMAL HIGH (ref 6–20)
CO2: 25 mmol/L (ref 22–32)
Calcium: 9.9 mg/dL (ref 8.9–10.3)
Chloride: 107 mmol/L (ref 98–111)
Creatinine: 1.4 mg/dL — ABNORMAL HIGH (ref 0.44–1.00)
GFR, Estimated: 47 mL/min — ABNORMAL LOW (ref >60)
Glucose, Bld: 108 mg/dL — ABNORMAL HIGH (ref 70–99)
Potassium: 3.4 mmol/L — ABNORMAL LOW (ref 3.5–5.1)
Sodium: 141 mmol/L (ref 135–145)
Total Bilirubin: 0.5 mg/dL (ref 0.0–1.2)
Total Protein: 7.6 g/dL (ref 6.5–8.1)

## 2023-11-23 LAB — PREGNANCY, URINE: Preg Test, Ur: NEGATIVE

## 2023-11-24 ENCOUNTER — Encounter: Payer: Self-pay | Admitting: Hematology

## 2023-11-26 LAB — MULTIPLE MYELOMA PANEL, SERUM
Albumin SerPl Elph-Mcnc: 3.5 g/dL (ref 2.9–4.4)
Albumin/Glob SerPl: 1.2 (ref 0.7–1.7)
Alpha 1: 0.3 g/dL (ref 0.0–0.4)
Alpha2 Glob SerPl Elph-Mcnc: 0.8 g/dL (ref 0.4–1.0)
B-Globulin SerPl Elph-Mcnc: 1.2 g/dL (ref 0.7–1.3)
Gamma Glob SerPl Elph-Mcnc: 0.8 g/dL (ref 0.4–1.8)
Globulin, Total: 3.1 g/dL (ref 2.2–3.9)
IgA: 236 mg/dL (ref 87–352)
IgG (Immunoglobin G), Serum: 870 mg/dL (ref 586–1602)
IgM (Immunoglobulin M), Srm: 11 mg/dL — ABNORMAL LOW (ref 26–217)
Total Protein ELP: 6.6 g/dL (ref 6.0–8.5)

## 2023-11-29 ENCOUNTER — Other Ambulatory Visit: Payer: Self-pay

## 2023-11-29 MED ORDER — GABAPENTIN 100 MG PO CAPS: ORAL_CAPSULE | ORAL | 2 refills | Status: DC

## 2023-11-30 ENCOUNTER — Other Ambulatory Visit: Payer: Self-pay

## 2023-11-30 DIAGNOSIS — C9001 Multiple myeloma in remission: Secondary | ICD-10-CM

## 2023-12-01 ENCOUNTER — Other Ambulatory Visit

## 2023-12-01 ENCOUNTER — Encounter: Payer: Self-pay | Admitting: Hematology

## 2023-12-01 MED ORDER — TRAMADOL HCL 50 MG PO TABS: 50.0000 mg | ORAL_TABLET | Freq: Four times a day (QID) | ORAL | 0 refills | Status: DC | PRN

## 2023-12-06 ENCOUNTER — Other Ambulatory Visit: Payer: Self-pay | Admitting: Physician Assistant

## 2023-12-06 DIAGNOSIS — C9001 Multiple myeloma in remission: Secondary | ICD-10-CM

## 2023-12-15 ENCOUNTER — Inpatient Hospital Stay: Admitting: Hematology

## 2023-12-15 VITALS — BP 113/66 | HR 78 | Temp 97.5°F | Resp 20 | Wt 262.9 lb

## 2023-12-15 DIAGNOSIS — C9001 Multiple myeloma in remission: Secondary | ICD-10-CM

## 2023-12-15 DIAGNOSIS — G893 Neoplasm related pain (acute) (chronic): Secondary | ICD-10-CM | POA: Diagnosis not present

## 2023-12-15 NOTE — Progress Notes (Signed)
 HEMATOLOGY ONCOLOGY PROGRESS NOTE  Date of service: 12/15/2023  Patient Care Team: Shona Norleen PEDLAR, MD as PCP - General (Internal Medicine) Alona Corners, DO (Inactive) as Consulting Physician (Interventional Radiology)  CHIEF COMPLAINT/PURPOSE OF CONSULTATION: Follow-up for continued evaluation management of myeloma  HISTORY OF PRESENTING ILLNESS: (04/15/2020) Stacey Montoya is a wonderful 48 y.o. female who is here today for evaluation and management of newly diagnosed Myeloma. The patient's last visit with us  was inpatient on 04/02/2020. The pt reports that she is doing well overall. We are joined today by her husband.   The pt reports that she has been enjoying being back home, but notes more pain in her leg due to increased activities. She note some soreness in her throat. The pt notes her back pain has improved, and she is very hungry thus eating and drinking well. The pt notes minor abdominal pain. The pt notes this pain in her left leg is combination of both pain and weakness. The pt notes no issues getting into and out of the car for travels. She feels a deep burn down the side of her thigh that is somewhat positional. The pt notes more pain going up stairs than down stairs.   Lab results today 04/15/2020 of CBC w/diff and CMP is as follows: all values are WNL except for WBC of 3.6, RBC of 2.84, Hgb of 9.0, HCT of 28.3, RDW of 16.1, Plt of 142K, Lymphs Abs of 0.2K, Sodium of 134, Glucose of 132, Calcium  of 8.1, Albumin of 2.6, AST of 10, Alkaline Phosphatase of 325. 04/15/2020 MMP in progress. 04/15/2020 K/L Light Chains in progress.   On review of systems, pt reports leg pain, leg weakness and denies changes in bowel habits, difficulty urinating, decreased appetite, back pain, abdominal pain, leg swelling, thigh pain and any other symptoms.  SUMMARY OF ONCOLOGIC HISTORY: Oncology History  Multiple myeloma (HCC)  04/02/2020 Initial Diagnosis   Multiple myeloma (HCC)   04/08/2020 -  08/20/2020 Chemotherapy         09/10/2020 -  Chemotherapy   Patient is on Treatment Plan : MYELOMA NEWLY DIAGNOSED TRANSPLANT CANDIDATE DaraVRd (Daratumumab IV) q21d x 6 Cycles (Induction/Consolidation)     Malignant neoplasm metastatic to bone (HCC)  04/15/2020 Initial Diagnosis   Bone metastases (HCC)   09/10/2020 -  Chemotherapy   Patient is on Treatment Plan : MYELOMA NEWLY DIAGNOSED TRANSPLANT CANDIDATE DaraVRd (Daratumumab IV) q21d x 6 Cycles (Induction/Consolidation)      INTERVAL HISTORY:  Stacey Montoya is a 48 y.o. female who is here today for continued evaluation and management of myeloma. accompanied by mother. she was last seen by me on 09/08/2023; at the time she mentioned experiencing stable chronic back pain, and stable diarrhea 2-3x a day, which was unchanged.  Today, she says that she is having difficulty sleeping due to severe neuropathy (burning and stabbing pains) in feet, but occasionally radiates up to her knees, and this is in spite of taking Gabapentin  1000 mg daily with regards to kidneys (tolerating without drowsiness). This does not bother her during the day  No issues tolerating Revlimid , diarrhea has improved after going on a fast and changing her diet.   Has been trying to remain as active. Expressed frustration of with weight gain after fasting and dietary change.  REVIEW OF SYSTEMS:    10 Point review of systems of done and is negative except as noted above.  MEDICAL HISTORY Past Medical History:  Diagnosis Date   Abnormal Pap  smear of cervix    Back pain    Cancer (HCC) 04/02/2020   multiple myeloma   Hyperlipidemia    Hypertension     SURGICAL HISTORY Past Surgical History:  Procedure Laterality Date   CERVICAL CONIZATION W/BX  12/02/2010   Procedure: CONIZATION CERVIX WITH BIOPSY;  Surgeon: Bobie DELENA Cary;  Location: [facility];  Service: Gynecology;  Laterality: N/A;  COLD KNIFE   CESAREAN SECTION     DILATION AND CURETTAGE OF UTERUS   12/02/2010   Procedure: DILATATION AND CURETTAGE (D&C);  Surgeon: Bobie DELENA Cary;  Location: [facility];  Service: Gynecology;  Laterality: N/A;   IR RADIOLOGIST EVAL & MGMT  04/16/2020   KYPHOPLASTY Bilateral 09/05/2020   Procedure: KYPHOPLASTY THORACIC TWELVE AND LUMBAR FOUR;  Surgeon: Lanis Pupa, MD;  Location: [facility];  Service: Neurosurgery;  Laterality: Bilateral;   left hand     drain - infection   WISDOM TOOTH EXTRACTION      SOCIAL HISTORY Social History   Tobacco Use   Smoking status: Former    Current packs/day: 0.50    Average packs/day: 0.5 packs/day for 20.0 years (10.0 ttl pk-yrs)    Types: Cigarettes   Smokeless tobacco: Never  Vaping Use   Vaping status: Some Days  Substance Use Topics   Alcohol use: Not Currently   Drug use: Not Currently    Social History   Social History Narrative   ** Merged History Encounter **        SOCIAL DRIVERS OF HEALTH SDOH Screenings   Food Insecurity: No Food Insecurity (10/05/2022)  Housing: Low Risk  (10/05/2022)  Transportation Needs: No Transportation Needs (10/05/2022)  Utilities: Not At Risk (10/05/2022)  Tobacco Use: Medium Risk (12/24/2022)     FAMILY HISTORY Family History  Problem Relation Age of Onset   Lung cancer Maternal Grandfather    Pancreatic cancer Paternal Grandfather      ALLERGIES: is allergic to duloxetine  hcl and zithromax [azithromycin].  MEDICATIONS  Current Outpatient Medications  Medication Sig Dispense Refill   acetaminophen  (TYLENOL ) 500 MG tablet Take 2 tablets (1,000 mg total) by mouth in the morning and at bedtime.     acyclovir  (ZOVIRAX ) 400 MG tablet TAKE (1) TABLET BY MOUTH TWICE DAILY. 60 tablet 5   albuterol  (VENTOLIN  HFA) 108 (90 Base) MCG/ACT inhaler Inhale 1-2 puffs into the lungs every 6 (six) hours as needed for wheezing.     amLODipine  (NORVASC ) 5 MG tablet Take 1 tablet (5 mg total) by mouth daily. HOLD until follow-up appointment with your PCP.     b complex vitamins  capsule Take 1 capsule by mouth daily. 30 capsule    docusate sodium  (COLACE) 100 MG capsule Take 1 capsule (100 mg total) by mouth 2 (two) times daily. Also available OTC. 60 capsule 0   ELIQUIS  2.5 MG TABS tablet TAKE ONE TABLET BY MOUTH TWICE DAILY 60 tablet 5   ergocalciferol  (VITAMIN D2) 1.25 MG (50000 UT) capsule Take 1 capsule (50,000 Units total) by mouth once a week.     gabapentin  (NEURONTIN ) 100 MG capsule TAKE 2 capSULES (200mg ) in the morning at 8AM, 3 capsULES (300mg ) at noon and 5 capsULES (500mg ) at bedtime 300 capsule 2   lenalidomide  (REVLIMID ) 5 MG capsule Take 1 capsule (5 mg total) by mouth daily. Take 1 capsule (5 mg total) by mouth daily for 21 days and then take 7 days off. Repeat cycle. 21 capsule 0   medroxyPROGESTERone  (PROVERA ) 10 MG tablet Take 10  mg by mouth daily.     Melatonin 10 MG TABS Take 20 mg by mouth at bedtime.     metoprolol  tartrate (LOPRESSOR ) 50 MG tablet Take 1 tablet (50 mg total) by mouth 2 (two) times daily. 60 tablet 3   omeprazole (PRILOSEC) 20 MG capsule Take 20 mg by mouth daily.     ondansetron  (ZOFRAN ) 4 MG tablet Take 2 tablets (8 mg total) by mouth every 8 (eight) hours as needed for nausea or vomiting. 30 tablet 0   polyethylene glycol (MIRALAX  / GLYCOLAX ) 17 g packet Take 17 g by mouth daily as needed for mild constipation.     pravastatin  (PRAVACHOL ) 40 MG tablet Take 40 mg by mouth daily.     prochlorperazine  (COMPAZINE ) 10 MG tablet Take 1 tablet (10 mg total) by mouth every 6 (six) hours as needed for nausea or vomiting. 30 tablet 2   tiZANidine  (ZANAFLEX ) 2 MG tablet Take 1 tablet (2 mg total) by mouth 2 (two) times daily as needed for muscle spasms. 30 tablet 1   traMADol  (ULTRAM ) 50 MG tablet Take 1 tablet (50 mg total) by mouth every 6 (six) hours as needed. 60 tablet 0   No current facility-administered medications for this visit.    PHYSICAL EXAMINATION: ECOG PERFORMANCE STATUS: 1 - Symptomatic but completely  ambulatory VITALS: Vitals:   12/15/23 1018  BP: 113/66  Pulse: 78  Resp: 20  Temp: (!) 97.5 F (36.4 C)  SpO2: 96%   Filed Weights   12/15/23 1018  Weight: 262 lb 14.4 oz (119.3 kg)   Body mass index is 46.57 kg/m.  GENERAL: alert, in no acute distress and comfortable SKIN: no acute rashes, no significant lesions EYES: conjunctiva are pink and non-injected, sclera anicteric OROPHARYNX: MMM, no exudates, no oropharyngeal erythema or ulceration NECK: supple, no JVD LYMPH:  no palpable lymphadenopathy in the cervical, axillary or inguinal regions LUNGS: clear to auscultation b/l with normal respiratory effort HEART: regular rate & rhythm ABDOMEN:  normoactive bowel sounds , non tender, not distended, no hepatosplenomegaly Extremity: no pedal edema PSYCH: alert & oriented x 3 with fluent speech NEURO: no focal motor/sensory deficits  LABORATORY DATA:   I have reviewed the data as listed     Latest Ref Rng & Units 11/23/2023   11:03 AM 10/26/2023   11:19 AM 09/28/2023   11:36 AM  CBC EXTENDED  WBC 4.0 - 10.5 K/uL 6.4  5.5  6.3   RBC 3.87 - 5.11 MIL/uL 4.24  3.94  3.95   Hemoglobin 12.0 - 15.0 g/dL 85.6  86.5  86.4   HCT 36.0 - 46.0 % 42.3  39.8  40.2   Platelets 150 - 400 K/uL 230  237  216   NEUT# 1.7 - 7.7 K/uL 4.0  3.2  4.2   Lymph# 0.7 - 4.0 K/uL 1.2  1.3  0.9        Latest Ref Rng & Units 11/23/2023   11:03 AM 10/26/2023   11:19 AM 09/28/2023   11:36 AM  CMP  Glucose 70 - 99 mg/dL 891  96  897   BUN 6 - 20 mg/dL 24  25  24    Creatinine 0.44 - 1.00 mg/dL 8.59  8.52  8.51   Sodium 135 - 145 mmol/L 141  141  138   Potassium 3.5 - 5.1 mmol/L 3.4  3.6  4.0   Chloride 98 - 111 mmol/L 107  108  105   CO2 22 - 32 mmol/L 25  24  24   Calcium  8.9 - 10.3 mg/dL 9.9  9.6  9.6   Total Protein 6.5 - 8.1 g/dL 7.6  7.3  7.0   Total Bilirubin 0.0 - 1.2 mg/dL 0.5  0.4  0.5   Alkaline Phos 38 - 126 U/L 57  61  70   AST 15 - 41 U/L 19  21  31    ALT 0 - 44 U/L 28  37  56     MULTIPLE MYELOMA 04/2023 - 11/2023  IFE: The immunofixation pattern appears unremarkable. Evidence of monoclonal protein is not apparent.   RADIOGRAPHIC STUDIES: I have personally reviewed the radiological images as listed and agreed with the findings in the report. No results found.  04/03/2020 Cytogenetics Report    04/03/2020 Molecular Pathology FISH Analysis    ASSESSMENT & PLAN:   48 y.o. 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   2) h/o hypercalcemia due to multiple myeloma-now resolved with IV fluids, calcitonin, pamidronate . 3) h/o anemia due to multiple myeloma becoming more apparent as the patient's hemoconcentration due to dehydration has been corrected.   4) h/o acute renal failure related to dehydration hypercalcemia and multiple myeloma.  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   PLAN: - Discussed lab results on 12/15/2023 in detail with patient: CBC stable with WBC 6.4K, Hemoglobin 14.3, and PLTs 230. CMP with Creatinine 1.40 decreased from 1.47 and Potassium 3.4 decreased from 3.6.   Stable CKD. No longer taking Potassium supplement due to kidney issues.   Suggest increasing dietary Potassium intake as Revlimid  can contribute to hypokalemia  Multiple Myeloma panel stable with no detectable M protein. Myeloma continues to be in remission. Continue maintenance Revlimid   - For her neuropathy, for which she is currently taking Gabapentin  -- we  increased the dose to 300mg  in AM and afternoon and 600mg  at bedtime, I have allowed her to increase this with the improvement in her kidney functions.  Additionally suggested using weighted blanket, compression socks, massages, or TENS unit.   FOLLOW-UP Labs in 12 weeks RTC with Dr Onesimo in 14 weeks  The total time spent in the appointment was 30 minutes* .  All of the patient's questions were answered and the patient knows to call the clinic with any problems, questions, or concerns.  Emaline Onesimo MD MS AAHIVMS [facility] Hematology/Oncology Physician [facility]  *Total Encounter Time as defined by the Centers for Medicare and Medicaid Services includes, in addition to the face-to-face time of a patient visit (documented in the note above) non-face-to-face time: obtaining and reviewing outside history, ordering and reviewing medications, tests or procedures, care coordination (communications with other health care professionals or caregivers) and documentation in the medical record.  I,Emily Lagle,acting as a neurosurgeon for Emaline Onesimo, MD.,have documented all relevant documentation on the behalf of Emaline Onesimo, MD,as directed by  Emaline Onesimo, MD while in the presence of Emaline Onesimo, MD.  I have reviewed the above documentation for accuracy and completeness, and I agree  with the above.  Kaeley Vinje, MD

## 2023-12-16 ENCOUNTER — Other Ambulatory Visit: Payer: Self-pay

## 2023-12-16 ENCOUNTER — Encounter: Payer: Self-pay | Admitting: Hematology

## 2023-12-17 ENCOUNTER — Other Ambulatory Visit: Payer: Self-pay

## 2023-12-17 DIAGNOSIS — C9001 Multiple myeloma in remission: Secondary | ICD-10-CM

## 2023-12-19 ENCOUNTER — Other Ambulatory Visit: Payer: Self-pay | Admitting: Hematology

## 2023-12-19 DIAGNOSIS — C9001 Multiple myeloma in remission: Secondary | ICD-10-CM

## 2023-12-21 ENCOUNTER — Encounter: Payer: Self-pay | Admitting: Hematology

## 2023-12-21 ENCOUNTER — Inpatient Hospital Stay: Attending: Hematology

## 2023-12-21 DIAGNOSIS — C9001 Multiple myeloma in remission: Secondary | ICD-10-CM | POA: Insufficient documentation

## 2023-12-21 LAB — PREGNANCY, URINE: Preg Test, Ur: NEGATIVE

## 2023-12-27 ENCOUNTER — Encounter: Payer: Self-pay | Admitting: Hematology

## 2023-12-27 ENCOUNTER — Other Ambulatory Visit: Payer: Self-pay | Admitting: Hematology

## 2023-12-27 DIAGNOSIS — C9001 Multiple myeloma in remission: Secondary | ICD-10-CM

## 2023-12-27 MED ORDER — GABAPENTIN 300 MG PO CAPS: ORAL_CAPSULE | ORAL | 3 refills | Status: DC

## 2024-01-14 ENCOUNTER — Other Ambulatory Visit: Payer: Self-pay | Admitting: Hematology

## 2024-01-15 ENCOUNTER — Other Ambulatory Visit: Payer: Self-pay | Admitting: Hematology

## 2024-01-15 DIAGNOSIS — C9001 Multiple myeloma in remission: Secondary | ICD-10-CM

## 2024-01-17 ENCOUNTER — Encounter: Payer: Self-pay | Admitting: Hematology

## 2024-01-18 ENCOUNTER — Inpatient Hospital Stay: Attending: Hematology

## 2024-01-18 ENCOUNTER — Other Ambulatory Visit: Payer: Self-pay

## 2024-01-18 DIAGNOSIS — C9001 Multiple myeloma in remission: Secondary | ICD-10-CM

## 2024-01-18 LAB — PREGNANCY, URINE: Preg Test, Ur: NEGATIVE

## 2024-01-19 ENCOUNTER — Other Ambulatory Visit: Payer: Self-pay | Admitting: Hematology

## 2024-01-19 ENCOUNTER — Encounter: Payer: Self-pay | Admitting: Hematology

## 2024-01-19 MED ORDER — GABAPENTIN 300 MG PO CAPS: ORAL_CAPSULE | ORAL | 3 refills | Status: AC

## 2024-01-21 ENCOUNTER — Other Ambulatory Visit: Payer: Self-pay | Admitting: Hematology

## 2024-01-21 DIAGNOSIS — C9001 Multiple myeloma in remission: Secondary | ICD-10-CM

## 2024-01-31 ENCOUNTER — Other Ambulatory Visit: Payer: Self-pay | Admitting: Hematology

## 2024-01-31 DIAGNOSIS — C9001 Multiple myeloma in remission: Secondary | ICD-10-CM

## 2024-02-08 ENCOUNTER — Other Ambulatory Visit (HOSPITAL_COMMUNITY): Payer: Self-pay | Admitting: Family Medicine

## 2024-02-08 DIAGNOSIS — R0989 Other specified symptoms and signs involving the circulatory and respiratory systems: Secondary | ICD-10-CM

## 2024-02-10 ENCOUNTER — Inpatient Hospital Stay (HOSPITAL_COMMUNITY)
Admission: EM | Admit: 2024-02-10 | Discharge: 2024-02-15 | DRG: 871 | Disposition: A | Discharge status: Home / Self Care | Attending: Family Medicine | Admitting: Family Medicine

## 2024-02-10 ENCOUNTER — Encounter (HOSPITAL_COMMUNITY): Payer: Self-pay | Admitting: *Deleted

## 2024-02-10 ENCOUNTER — Emergency Department (HOSPITAL_COMMUNITY)

## 2024-02-10 ENCOUNTER — Other Ambulatory Visit: Payer: Self-pay

## 2024-02-10 DIAGNOSIS — J9601 Acute respiratory failure with hypoxia: Secondary | ICD-10-CM | POA: Diagnosis present

## 2024-02-10 DIAGNOSIS — F17290 Nicotine dependence, other tobacco product, uncomplicated: Secondary | ICD-10-CM | POA: Diagnosis present

## 2024-02-10 DIAGNOSIS — N179 Acute kidney failure, unspecified: Secondary | ICD-10-CM | POA: Diagnosis present

## 2024-02-10 DIAGNOSIS — A419 Sepsis, unspecified organism: Principal | ICD-10-CM | POA: Diagnosis present

## 2024-02-10 DIAGNOSIS — J188 Other pneumonia, unspecified organism: Principal | ICD-10-CM

## 2024-02-10 DIAGNOSIS — J440 Chronic obstructive pulmonary disease with acute lower respiratory infection: Secondary | ICD-10-CM | POA: Diagnosis present

## 2024-02-10 DIAGNOSIS — Z1152 Encounter for screening for COVID-19: Secondary | ICD-10-CM

## 2024-02-10 DIAGNOSIS — J122 Parainfluenza virus pneumonia: Secondary | ICD-10-CM | POA: Diagnosis present

## 2024-02-10 DIAGNOSIS — F17210 Nicotine dependence, cigarettes, uncomplicated: Secondary | ICD-10-CM | POA: Diagnosis present

## 2024-02-10 DIAGNOSIS — Z8701 Personal history of pneumonia (recurrent): Secondary | ICD-10-CM

## 2024-02-10 DIAGNOSIS — R7401 Elevation of levels of liver transaminase levels: Secondary | ICD-10-CM | POA: Diagnosis present

## 2024-02-10 DIAGNOSIS — Z716 Tobacco abuse counseling: Secondary | ICD-10-CM

## 2024-02-10 DIAGNOSIS — Z6841 Body Mass Index (BMI) 40.0 and over, adult: Secondary | ICD-10-CM

## 2024-02-10 DIAGNOSIS — Z7901 Long term (current) use of anticoagulants: Secondary | ICD-10-CM

## 2024-02-10 DIAGNOSIS — C9000 Multiple myeloma not having achieved remission: Secondary | ICD-10-CM | POA: Diagnosis present

## 2024-02-10 DIAGNOSIS — E785 Hyperlipidemia, unspecified: Secondary | ICD-10-CM | POA: Diagnosis present

## 2024-02-10 DIAGNOSIS — I129 Hypertensive chronic kidney disease with stage 1 through stage 4 chronic kidney disease, or unspecified chronic kidney disease: Secondary | ICD-10-CM | POA: Diagnosis present

## 2024-02-10 DIAGNOSIS — E860 Dehydration: Secondary | ICD-10-CM | POA: Diagnosis present

## 2024-02-10 DIAGNOSIS — G629 Polyneuropathy, unspecified: Secondary | ICD-10-CM | POA: Diagnosis present

## 2024-02-10 DIAGNOSIS — J441 Chronic obstructive pulmonary disease with (acute) exacerbation: Secondary | ICD-10-CM | POA: Diagnosis present

## 2024-02-10 MED ORDER — SODIUM CHLORIDE 0.9 % IV SOLN
2.0000 g | Freq: Once | INTRAVENOUS | Status: AC
  Administered 2024-02-11: 2 g via INTRAVENOUS
  Filled 2024-02-10: qty 20

## 2024-02-10 NOTE — ED Triage Notes (Signed)
 Pt recently dx'd with bronchitis and was to have oral steroid called in but was not done. Pt c/o SOB

## 2024-02-11 ENCOUNTER — Encounter (HOSPITAL_COMMUNITY): Payer: Self-pay | Admitting: Acute Care

## 2024-02-11 ENCOUNTER — Encounter: Payer: Self-pay | Admitting: Hematology

## 2024-02-11 ENCOUNTER — Inpatient Hospital Stay (HOSPITAL_COMMUNITY)

## 2024-02-11 DIAGNOSIS — J122 Parainfluenza virus pneumonia: Secondary | ICD-10-CM | POA: Diagnosis present

## 2024-02-11 DIAGNOSIS — N179 Acute kidney failure, unspecified: Secondary | ICD-10-CM

## 2024-02-11 DIAGNOSIS — Z6841 Body Mass Index (BMI) 40.0 and over, adult: Secondary | ICD-10-CM | POA: Diagnosis not present

## 2024-02-11 DIAGNOSIS — E860 Dehydration: Secondary | ICD-10-CM | POA: Diagnosis present

## 2024-02-11 DIAGNOSIS — E785 Hyperlipidemia, unspecified: Secondary | ICD-10-CM | POA: Diagnosis present

## 2024-02-11 DIAGNOSIS — J188 Other pneumonia, unspecified organism: Principal | ICD-10-CM

## 2024-02-11 DIAGNOSIS — J189 Pneumonia, unspecified organism: Secondary | ICD-10-CM

## 2024-02-11 DIAGNOSIS — I129 Hypertensive chronic kidney disease with stage 1 through stage 4 chronic kidney disease, or unspecified chronic kidney disease: Secondary | ICD-10-CM | POA: Diagnosis present

## 2024-02-11 DIAGNOSIS — Z7901 Long term (current) use of anticoagulants: Secondary | ICD-10-CM | POA: Diagnosis not present

## 2024-02-11 DIAGNOSIS — C9000 Multiple myeloma not having achieved remission: Secondary | ICD-10-CM | POA: Diagnosis present

## 2024-02-11 DIAGNOSIS — J9601 Acute respiratory failure with hypoxia: Secondary | ICD-10-CM

## 2024-02-11 DIAGNOSIS — J440 Chronic obstructive pulmonary disease with acute lower respiratory infection: Secondary | ICD-10-CM | POA: Diagnosis present

## 2024-02-11 DIAGNOSIS — R0902 Hypoxemia: Secondary | ICD-10-CM

## 2024-02-11 DIAGNOSIS — F17210 Nicotine dependence, cigarettes, uncomplicated: Secondary | ICD-10-CM | POA: Diagnosis present

## 2024-02-11 DIAGNOSIS — Z1152 Encounter for screening for COVID-19: Secondary | ICD-10-CM | POA: Diagnosis not present

## 2024-02-11 DIAGNOSIS — G629 Polyneuropathy, unspecified: Secondary | ICD-10-CM | POA: Diagnosis present

## 2024-02-11 DIAGNOSIS — A419 Sepsis, unspecified organism: Secondary | ICD-10-CM | POA: Diagnosis present

## 2024-02-11 DIAGNOSIS — R7401 Elevation of levels of liver transaminase levels: Secondary | ICD-10-CM

## 2024-02-11 DIAGNOSIS — F17290 Nicotine dependence, other tobacco product, uncomplicated: Secondary | ICD-10-CM | POA: Diagnosis present

## 2024-02-11 DIAGNOSIS — Z716 Tobacco abuse counseling: Secondary | ICD-10-CM | POA: Diagnosis not present

## 2024-02-11 DIAGNOSIS — J441 Chronic obstructive pulmonary disease with (acute) exacerbation: Secondary | ICD-10-CM | POA: Diagnosis present

## 2024-02-11 DIAGNOSIS — Z8701 Personal history of pneumonia (recurrent): Secondary | ICD-10-CM | POA: Diagnosis not present

## 2024-02-11 LAB — CBC WITH DIFFERENTIAL/PLATELET
Abs Immature Granulocytes: 0.06 K/uL (ref 0.00–0.07)
Basophils Absolute: 0 K/uL (ref 0.0–0.1)
Basophils Relative: 0 %
Eosinophils Absolute: 0.1 K/uL (ref 0.0–0.5)
Eosinophils Relative: 3 %
HCT: 35 % — ABNORMAL LOW (ref 36.0–46.0)
Hemoglobin: 11.6 g/dL — ABNORMAL LOW (ref 12.0–15.0)
Immature Granulocytes: 1 %
Lymphocytes Relative: 7 %
Lymphs Abs: 0.4 K/uL — ABNORMAL LOW (ref 0.7–4.0)
MCH: 33.4 pg (ref 26.0–34.0)
MCHC: 33.1 g/dL (ref 30.0–36.0)
MCV: 100.9 fL — ABNORMAL HIGH (ref 80.0–100.0)
Monocytes Absolute: 0.3 K/uL (ref 0.1–1.0)
Monocytes Relative: 6 %
Neutro Abs: 4.2 K/uL (ref 1.7–7.7)
Neutrophils Relative %: 83 %
Platelets: 153 K/uL (ref 150–400)
RBC: 3.47 MIL/uL — ABNORMAL LOW (ref 3.87–5.11)
RDW: 16.7 % — ABNORMAL HIGH (ref 11.5–15.5)
WBC: 5.1 K/uL (ref 4.0–10.5)
nRBC: 0 % (ref 0.0–0.2)

## 2024-02-11 LAB — RESP PANEL BY RT-PCR (RSV, FLU A&B, COVID)  RVPGX2
Influenza A by PCR: NEGATIVE
Influenza B by PCR: NEGATIVE
Resp Syncytial Virus by PCR: NEGATIVE
SARS Coronavirus 2 by RT PCR: NEGATIVE

## 2024-02-11 LAB — RESPIRATORY PANEL BY PCR

## 2024-02-11 LAB — CBC
HCT: 43.4 % (ref 36.0–46.0)
Hemoglobin: 13.9 g/dL (ref 12.0–15.0)
MCH: 33.5 pg (ref 26.0–34.0)
MCHC: 32 g/dL (ref 30.0–36.0)
MCV: 104.6 fL — ABNORMAL HIGH (ref 80.0–100.0)
Platelets: 165 K/uL (ref 150–400)
RBC: 4.15 MIL/uL (ref 3.87–5.11)
RDW: 16.8 % — ABNORMAL HIGH (ref 11.5–15.5)
WBC: 5.9 K/uL (ref 4.0–10.5)
nRBC: 0 % (ref 0.0–0.2)

## 2024-02-11 LAB — COMPREHENSIVE METABOLIC PANEL WITH GFR
ALT: 45 U/L — ABNORMAL HIGH (ref 0–44)
ALT: 39 U/L (ref 0–44)
AST: 58 U/L — ABNORMAL HIGH (ref 15–41)
AST: 31 U/L (ref 15–41)
Albumin: 3.8 g/dL (ref 3.5–5.0)
Albumin: 3.4 g/dL — ABNORMAL LOW (ref 3.5–5.0)
Alkaline Phosphatase: 74 U/L (ref 38–126)
Alkaline Phosphatase: 61 U/L (ref 38–126)
Anion gap: 14 (ref 5–15)
Anion gap: 9 (ref 5–15)
BUN: 12 mg/dL (ref 6–20)
BUN: 11 mg/dL (ref 6–20)
CO2: 22 mmol/L (ref 22–32)
CO2: 26 mmol/L (ref 22–32)
Calcium: 9.1 mg/dL (ref 8.9–10.3)
Calcium: 8 mg/dL — ABNORMAL LOW (ref 8.9–10.3)
Chloride: 98 mmol/L (ref 98–111)
Chloride: 102 mmol/L (ref 98–111)
Creatinine, Ser: 1.62 mg/dL — ABNORMAL HIGH (ref 0.44–1.00)
Creatinine, Ser: 1.45 mg/dL — ABNORMAL HIGH (ref 0.44–1.00)
GFR, Estimated: 39 mL/min — ABNORMAL LOW
GFR, Estimated: 45 mL/min — ABNORMAL LOW
Glucose, Bld: 114 mg/dL — ABNORMAL HIGH (ref 70–99)
Glucose, Bld: 104 mg/dL — ABNORMAL HIGH (ref 70–99)
Potassium: 4.8 mmol/L (ref 3.5–5.1)
Potassium: 3.9 mmol/L (ref 3.5–5.1)
Sodium: 133 mmol/L — ABNORMAL LOW (ref 135–145)
Sodium: 136 mmol/L (ref 135–145)
Total Bilirubin: 0.5 mg/dL (ref 0.0–1.2)
Total Bilirubin: 0.3 mg/dL (ref 0.0–1.2)
Total Protein: 7.2 g/dL (ref 6.5–8.1)
Total Protein: 5.8 g/dL — ABNORMAL LOW (ref 6.5–8.1)

## 2024-02-11 LAB — PHOSPHORUS: Phosphorus: 2.5 mg/dL (ref 2.5–4.6)

## 2024-02-11 LAB — ECHOCARDIOGRAM COMPLETE
Area-P 1/2: 4.41 cm2
Height: 63 in
S' Lateral: 2.8 cm
Weight: 4160 [oz_av]

## 2024-02-11 LAB — MRSA NEXT GEN BY PCR, NASAL: MRSA by PCR Next Gen: NOT DETECTED

## 2024-02-11 LAB — POC URINE PREG, ED: Preg Test, Ur: NEGATIVE

## 2024-02-11 LAB — PROTIME-INR
INR: 1.1 (ref 0.8–1.2)
Prothrombin Time: 14.7 s (ref 11.4–15.2)

## 2024-02-11 LAB — PRO BRAIN NATRIURETIC PEPTIDE: Pro Brain Natriuretic Peptide: 769 pg/mL — ABNORMAL HIGH

## 2024-02-11 LAB — MAGNESIUM: Magnesium: 1.6 mg/dL — ABNORMAL LOW (ref 1.7–2.4)

## 2024-02-11 LAB — LACTIC ACID, PLASMA: Lactic Acid, Venous: 0.8 mmol/L (ref 0.5–1.9)

## 2024-02-11 MED ORDER — SENNOSIDES-DOCUSATE SODIUM 8.6-50 MG PO TABS: 1.0000 | ORAL_TABLET | Freq: Every evening | ORAL | Status: DC | PRN

## 2024-02-11 MED ORDER — PREDNISOLONE 5 MG PO TABS
60.0000 mg | ORAL_TABLET | Freq: Every day | ORAL | Status: DC
  Administered 2024-02-11 – 2024-02-12 (×2): 60 mg via ORAL
  Filled 2024-02-11 (×3): qty 12

## 2024-02-11 MED ORDER — MEDROXYPROGESTERONE ACETATE 5 MG PO TABS
10.0000 mg | ORAL_TABLET | Freq: Every day | ORAL | Status: DC
  Administered 2024-02-11 – 2024-02-15 (×5): 10 mg via ORAL
  Filled 2024-02-11: qty 2
  Filled 2024-02-11: qty 1
  Filled 2024-02-11 (×2): qty 2

## 2024-02-11 MED ORDER — TRAMADOL HCL 50 MG PO TABS
50.0000 mg | ORAL_TABLET | Freq: Four times a day (QID) | ORAL | Status: DC | PRN
  Administered 2024-02-11 – 2024-02-14 (×6): 50 mg via ORAL
  Filled 2024-02-11 (×5): qty 1

## 2024-02-11 MED ORDER — B COMPLEX VITAMINS PO CAPS: 1.0000 | ORAL_CAPSULE | Freq: Every day | ORAL | Status: DC

## 2024-02-11 MED ORDER — METHYLPREDNISOLONE SODIUM SUCC 125 MG IJ SOLR
125.0000 mg | Freq: Once | INTRAMUSCULAR | Status: AC
  Administered 2024-02-11: 125 mg via INTRAVENOUS
  Filled 2024-02-11: qty 2

## 2024-02-11 MED ORDER — CHLORHEXIDINE GLUCONATE CLOTH 2 % EX PADS
6.0000 | MEDICATED_PAD | Freq: Every day | CUTANEOUS | Status: DC
  Administered 2024-02-11 – 2024-02-15 (×5): 6 via TOPICAL

## 2024-02-11 MED ORDER — METOPROLOL TARTRATE 50 MG PO TABS
50.0000 mg | ORAL_TABLET | Freq: Two times a day (BID) | ORAL | Status: DC
  Administered 2024-02-11 – 2024-02-15 (×9): 50 mg via ORAL
  Filled 2024-02-11 (×6): qty 1

## 2024-02-11 MED ORDER — ALBUTEROL SULFATE (2.5 MG/3ML) 0.083% IN NEBU
2.5000 mg | INHALATION_SOLUTION | RESPIRATORY_TRACT | Status: DC | PRN
  Administered 2024-02-12 – 2024-02-15 (×3): 2.5 mg via RESPIRATORY_TRACT
  Filled 2024-02-11 (×2): qty 3

## 2024-02-11 MED ORDER — SODIUM CHLORIDE 0.9 % IV BOLUS
1000.0000 mL | Freq: Once | INTRAVENOUS | Status: AC
  Administered 2024-02-11: 1000 mL via INTRAVENOUS

## 2024-02-11 MED ORDER — SODIUM CHLORIDE 0.9 % IV SOLN
100.0000 mg | Freq: Two times a day (BID) | INTRAVENOUS | Status: DC
  Administered 2024-02-11 – 2024-02-12 (×3): 100 mg via INTRAVENOUS
  Filled 2024-02-11 (×6): qty 100

## 2024-02-11 MED ORDER — ORAL CARE MOUTH RINSE: 15.0000 mL | OROMUCOSAL | Status: DC | PRN

## 2024-02-11 MED ORDER — ACYCLOVIR 800 MG PO TABS
400.0000 mg | ORAL_TABLET | Freq: Two times a day (BID) | ORAL | Status: DC
  Administered 2024-02-11 – 2024-02-15 (×9): 400 mg via ORAL
  Filled 2024-02-11 (×6): qty 1

## 2024-02-11 MED ORDER — IOHEXOL 350 MG/ML SOLN
60.0000 mL | Freq: Once | INTRAVENOUS | Status: AC | PRN
  Administered 2024-02-11: 60 mL via INTRAVENOUS

## 2024-02-11 MED ORDER — SODIUM CHLORIDE 0.9 % IV BOLUS
2500.0000 mL | Freq: Once | INTRAVENOUS | Status: AC
  Administered 2024-02-11: 1000 mL via INTRAVENOUS

## 2024-02-11 MED ORDER — MELATONIN 3 MG PO TABS
6.0000 mg | ORAL_TABLET | Freq: Every day | ORAL | Status: DC
  Administered 2024-02-11 – 2024-02-14 (×4): 6 mg via ORAL
  Filled 2024-02-11 (×3): qty 2

## 2024-02-11 MED ORDER — SODIUM CHLORIDE 0.9 % IV SOLN
2.0000 g | INTRAVENOUS | Status: DC
  Administered 2024-02-11 – 2024-02-14 (×4): 2 g via INTRAVENOUS
  Filled 2024-02-11 (×3): qty 20

## 2024-02-11 MED ORDER — PROCHLORPERAZINE MALEATE 5 MG PO TABS: 10.0000 mg | ORAL_TABLET | Freq: Four times a day (QID) | ORAL | Status: DC | PRN

## 2024-02-11 MED ORDER — LACTATED RINGERS IV SOLN: INTRAVENOUS | Status: AC

## 2024-02-11 MED ORDER — IPRATROPIUM-ALBUTEROL 0.5-2.5 (3) MG/3ML IN SOLN: 3.0000 mL | Freq: Four times a day (QID) | RESPIRATORY_TRACT | Status: DC

## 2024-02-11 MED ORDER — BISACODYL 5 MG PO TBEC: 5.0000 mg | DELAYED_RELEASE_TABLET | Freq: Every day | ORAL | Status: DC | PRN

## 2024-02-11 MED ORDER — ACETAMINOPHEN 500 MG PO TABS
1000.0000 mg | ORAL_TABLET | Freq: Four times a day (QID) | ORAL | Status: DC | PRN
  Administered 2024-02-11 – 2024-02-14 (×8): 1000 mg via ORAL
  Filled 2024-02-11 (×7): qty 2

## 2024-02-11 MED ORDER — AMLODIPINE BESYLATE 5 MG PO TABS
5.0000 mg | ORAL_TABLET | Freq: Every day | ORAL | Status: DC
  Administered 2024-02-11 – 2024-02-15 (×5): 5 mg via ORAL
  Filled 2024-02-11 (×3): qty 1

## 2024-02-11 MED ORDER — IPRATROPIUM-ALBUTEROL 0.5-2.5 (3) MG/3ML IN SOLN
3.0000 mL | Freq: Four times a day (QID) | RESPIRATORY_TRACT | Status: DC
  Administered 2024-02-11 – 2024-02-12 (×5): 3 mL via RESPIRATORY_TRACT
  Filled 2024-02-11 (×8): qty 3

## 2024-02-11 MED ORDER — LENALIDOMIDE 5 MG PO CAPS: 5.0000 mg | ORAL_CAPSULE | Freq: Every day | ORAL | Status: DC

## 2024-02-11 MED ORDER — VITAMIN D (ERGOCALCIFEROL) 1.25 MG (50000 UNIT) PO CAPS
50000.0000 [IU] | ORAL_CAPSULE | ORAL | Status: DC
  Administered 2024-02-11: 50000 [IU] via ORAL
  Filled 2024-02-11 (×2): qty 1

## 2024-02-11 MED ORDER — ENSURE PLUS HIGH PROTEIN PO LIQD
237.0000 mL | Freq: Two times a day (BID) | ORAL | Status: DC
  Administered 2024-02-11 – 2024-02-15 (×7): 237 mL via ORAL

## 2024-02-11 MED ORDER — VITAMIN D (ERGOCALCIFEROL) 1.25 MG (50000 UNIT) PO CAPS: 50000.0000 [IU] | ORAL_CAPSULE | ORAL | Status: DC

## 2024-02-11 MED ORDER — B COMPLEX-C PO TABS
1.0000 | ORAL_TABLET | Freq: Every day | ORAL | Status: DC
  Administered 2024-02-11 – 2024-02-15 (×5): 1 via ORAL
  Filled 2024-02-11 (×4): qty 1

## 2024-02-11 MED ORDER — TIZANIDINE HCL 2 MG PO TABS
2.0000 mg | ORAL_TABLET | Freq: Two times a day (BID) | ORAL | Status: DC | PRN
  Administered 2024-02-11 – 2024-02-14 (×5): 2 mg via ORAL
  Filled 2024-02-11 (×6): qty 1

## 2024-02-11 MED ORDER — MAGNESIUM SULFATE 2 GM/50ML IV SOLN
2.0000 g | Freq: Once | INTRAVENOUS | Status: AC
  Administered 2024-02-11: 2 g via INTRAVENOUS
  Filled 2024-02-11: qty 50

## 2024-02-11 MED ORDER — PREDNISOLONE 5 MG PO TABS: 40.0000 mg | ORAL_TABLET | Freq: Every day | ORAL | Status: DC

## 2024-02-11 MED ORDER — NICOTINE 14 MG/24HR TD PT24
14.0000 mg | MEDICATED_PATCH | Freq: Every day | TRANSDERMAL | Status: DC
  Administered 2024-02-11 – 2024-02-15 (×5): 14 mg via TRANSDERMAL
  Filled 2024-02-11 (×3): qty 1

## 2024-02-11 MED ORDER — IPRATROPIUM-ALBUTEROL 0.5-2.5 (3) MG/3ML IN SOLN
3.0000 mL | RESPIRATORY_TRACT | Status: DC | PRN
  Administered 2024-02-11: 3 mL via RESPIRATORY_TRACT
  Filled 2024-02-11: qty 3

## 2024-02-11 MED ORDER — APIXABAN 2.5 MG PO TABS
2.5000 mg | ORAL_TABLET | Freq: Two times a day (BID) | ORAL | Status: DC
  Administered 2024-02-11 – 2024-02-15 (×9): 2.5 mg via ORAL
  Filled 2024-02-11 (×6): qty 1

## 2024-02-11 NOTE — TOC Initial Note (Signed)
 Transition of Care [facility]) - Initial/Assessment Note    Patient Details  Name: Stacey Montoya MRN: [id number] Date of Birth: 13-Jun-1975  Transition of Care [facility]) CM/SW Contact:    Hoy DELENA Bigness, LCSW Phone Number: 02/11/2024, 12:30 PM  Clinical Narrative:                 Pt assessed due to high risk for readmission. Pt lives at home with her spouse and is independent for ADL's and ambulation. Pt has RW, cane and shower chair at home that she has used in the past. Pt does not currently receive any home services. Pt is able to drive self to appointments. Pt is not on home oxygen at baseline. ICM will continue to follow for needs.   Expected Discharge Plan: Home/Self Care Barriers to Discharge: Continued Medical Work up   Patient Goals and CMS Choice Patient states their goals for this hospitalization and ongoing recovery are:: To return home CMS Medicare.gov Compare Post Acute Care list provided to:: Patient        Expected Discharge Plan and Services In-house Referral: Clinical Social Work Discharge Planning Services: NA Post Acute Care Choice: NA Living arrangements for the past 2 months: Single Family Home                                      Prior Living Arrangements/Services Living arrangements for the past 2 months: Single Family Home Lives with:: Spouse Patient language and need for interpreter reviewed:: Yes Do you feel safe going back to the place where you live?: Yes      Need for Family Participation in Patient Care: No (Comment) Care giver support system in place?: No (comment) Current home services: DME (RW, cane, shower chair) Criminal Activity/Legal Involvement Pertinent to Current Situation/Hospitalization: No - Comment as needed  Activities of Daily Living   ADL Screening (condition at time of admission) Independently performs ADLs?: Yes (appropriate for developmental age) Is the patient deaf or have difficulty hearing?: No Does the  patient have difficulty seeing, even when wearing glasses/contacts?: No Does the patient have difficulty concentrating, remembering, or making decisions?: Yes  Permission Sought/Granted Permission sought to share information with : Family Supports Permission granted to share information with : Yes, Verbal Permission Granted              Emotional Assessment Appearance:: Appears stated age Attitude/Demeanor/Rapport: Engaged Affect (typically observed): Accepting, Pleasant Orientation: : Oriented to Self, Oriented to Place, Oriented to  Time, Oriented to Situation Alcohol / Substance Use: Not Applicable Psych Involvement: No (comment)  Admission diagnosis:  Sepsis (HCC) [A41.9] Multifocal pneumonia [J18.8] Patient Active Problem List   Diagnosis Date Noted   Sepsis (HCC) 02/11/2024   Multifocal pneumonia 02/11/2024   Transaminitis 02/11/2024   Acute respiratory failure with hypoxia (HCC) 10/14/2022   Obesity, Class III, BMI 40-49.9 (morbid obesity) (HCC) 10/14/2022   Thrombocytopenia 10/14/2022   Pleural effusion on right c/w complex paraponeumonic 10/05/2022   Tachycardia 08/27/2021   Vitamin D  deficiency 08/27/2021   Chronic kidney disease, stage 3a (HCC) 08/25/2021   Gastroesophageal reflux disease without esophagitis 04/24/2021   Cough 04/18/2021   Lymphocytopenia 11/15/2020   Compression fracture of vertebral column (HCC) 07/17/2020   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   Essential hypertension 07/17/2020   Hyperlipidemia 07/17/2020   Morbid obesity (HCC) 07/17/2020   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   Tobacco user 07/17/2020   Malignant neoplasm  metastatic to bone (HCC) 04/15/2020   Encounter for antineoplastic chemotherapy    Counseling regarding advance care planning and goals of care 04/05/2020   Drug-induced constipation    Neoplasm related pain    Hypercalcemia of malignancy 04/02/2020   Lytic bone lesions on xray 04/02/2020   Hypertensive urgency 04/02/2020   Normocytic  anemia 04/02/2020   AKI (acute kidney injury) 04/02/2020   Hyponatremia 04/02/2020   Multiple myeloma (HCC)    PCP:  Shona Norleen PEDLAR, MD Pharmacy:   [facility] [place], [place] - [address] [place] [address] Phone: [phone] Fax: [phone]     Social Drivers of Health (SDOH) Social History: SDOH Screenings   Food Insecurity: No Food Insecurity (02/11/2024)  Housing: Low Risk (02/11/2024)  Transportation Needs: No Transportation Needs (02/11/2024)  Utilities: Not At Risk (02/11/2024)  Tobacco Use: Medium Risk (02/10/2024)   SDOH Interventions:     Readmission Risk Interventions    02/11/2024   12:29 PM 10/19/2022   11:00 AM 10/19/2022   10:59 AM  Readmission Risk Prevention Plan  Transportation Screening Complete  Complete  PCP or Specialist Appt within 3-5 Days Complete    HRI or Home Care Consult Complete    Social Work Consult for Recovery Care Planning/Counseling Complete    Palliative Care Screening Not Applicable    Medication Review Oceanographer) Complete  Complete  PCP or Specialist appointment within 3-5 days of discharge  Not Complete   PCP/Specialist Appt Not Complete comments  per AVS instructions   HRI or Home Care Consult   Complete  SW Recovery Care/Counseling Consult   Complete  Palliative Care Screening   Not Applicable  Skilled Nursing Facility   Not Applicable

## 2024-02-11 NOTE — Progress Notes (Addendum)
" °  PROGRESS NOTE    Stacey Montoya  FMW:[id number] DOB: 03-29-1975 DOA: 02/10/2024 PCP: Shona Norleen PEDLAR, MD     Same-day admission note:  Patient was seen and examined at the bedside.  Patient's family was also present at bedside.  She said that she has history of multiple myeloma, in remission and follows up with an oncologist at [facility].  She said that her symptoms started day before yesterday all of a sudden.  She denies any fever but had shortness of breath and generalized weakness. She started smoking at age 18 and smoked until 34.  Then, she switched to vaping.  She was vaping until 1-1/2 years back and then switched back to smoking cigarettes.  She currently smokes half pack a day.  She has not been diagnosed with COPD. He is currently on high flow oxygen at 8 L. MRSA PCR is negative.  Tested negative for COVID-19, RSV and influenza panel. I spoke to her about getting transthoracic echocardiogram as her proBNP 769. She said that she lives at home with her husband and does not use oxygen at baseline. He did tell me about her history of recurrent pneumonias.  She used to follow-up with pulmonologist, Dr. Darlean  She will be closely monitored in the ICU setting.    Deliliah Room, MD [facility]  If 7PM-7AM, please contact night-coverage  02/11/2024, 8:55 AM  "

## 2024-02-11 NOTE — Plan of Care (Signed)

## 2024-02-11 NOTE — ED Notes (Signed)
 Pt left for CT.

## 2024-02-11 NOTE — ED Notes (Signed)
 This RN gave the antibiotics at 0010 before the blood cultures were ordered.

## 2024-02-11 NOTE — Plan of Care (Signed)
 Patient doing well today.  Up to chair and bedside commode. Currently on 6L HFNC.

## 2024-02-11 NOTE — ED Provider Notes (Signed)
 " AP-EMERGENCY DEPT [facility] Emergency Department Provider Note MRN:  [id number]  Arrival date & time: 02/11/2024     Chief Complaint   Nasal Congestion   History of Present Illness   Stacey Montoya is a 48 y.o. year-old female with a history of hypertension, multiple Aloma presenting to the ED with chief complaint of nasal congestion.  Nasal congestion and URI symptoms for the past few days, today experiencing worsening cough and shortness of breath.  Malaise and fatigue.  Review of Systems  A thorough review of systems was obtained and all systems are negative except as noted in the HPI and PMH.   Patient's Health History    Past Medical History:  Diagnosis Date   Abnormal Pap smear of cervix    Back pain    Cancer (HCC) 04/02/2020   multiple myeloma   Hyperlipidemia    Hypertension     Past Surgical History:  Procedure Laterality Date   CERVICAL CONIZATION W/BX  12/02/2010   Procedure: CONIZATION CERVIX WITH BIOPSY;  Surgeon: Bobie DELENA Cary;  Location: [facility];  Service: Gynecology;  Laterality: N/A;  COLD KNIFE   CESAREAN SECTION     DILATION AND CURETTAGE OF UTERUS  12/02/2010   Procedure: DILATATION AND CURETTAGE (D&C);  Surgeon: Bobie DELENA Cary;  Location: [facility];  Service: Gynecology;  Laterality: N/A;   IR RADIOLOGIST EVAL & MGMT  04/16/2020   KYPHOPLASTY Bilateral 09/05/2020   Procedure: KYPHOPLASTY THORACIC TWELVE AND LUMBAR FOUR;  Surgeon: Lanis Pupa, MD;  Location: [facility];  Service: Neurosurgery;  Laterality: Bilateral;   left hand     drain - infection   WISDOM TOOTH EXTRACTION      Family History  Problem Relation Age of Onset   Lung cancer Maternal Grandfather    Pancreatic cancer Paternal Grandfather     Social History   Socioeconomic History   Marital status: Married    Spouse name: Not on file   Number of children: Not on file   Years of education: Not on file   Highest education level: Not on file  Occupational History   Not on  file  Tobacco Use   Smoking status: Former    Current packs/day: 0.50    Average packs/day: 0.5 packs/day for 20.0 years (10.0 ttl pk-yrs)    Types: Cigarettes   Smokeless tobacco: Never  Vaping Use   Vaping status: Some Days  Substance and Sexual Activity   Alcohol use: Not Currently   Drug use: Not Currently   Sexual activity: Not Currently    Partners: Male    Birth control/protection: Condom  Other Topics Concern   Not on file  Social History Narrative   ** Merged History Encounter **       Social Drivers of Health   Tobacco Use: Medium Risk (02/10/2024)   Patient History    Smoking Tobacco Use: Former    Smokeless Tobacco Use: Never    Passive Exposure: Not on Actuary Strain: Not on file  Food Insecurity: No Food Insecurity (10/05/2022)   Hunger Vital Sign    Worried About Running Out of Food in the Last Year: Never true    Ran Out of Food in the Last Year: Never true  Transportation Needs: No Transportation Needs (10/05/2022)   PRAPARE - Administrator, Civil Service (Medical): No    Lack of Transportation (Non-Medical): No  Physical Activity: Not on file  Stress: Not on file  Social  Connections: Not on file  Intimate Partner Violence: Not At Risk (10/05/2022)   Humiliation, Afraid, Rape, and Kick questionnaire    Fear of Current or Ex-Partner: No    Emotionally Abused: No    Physically Abused: No    Sexually Abused: No  Depression (PHQ2-9): Not on file  Alcohol Screen: Not on file  Housing: Low Risk (10/05/2022)   Housing    Last Housing Risk Score: 0  Utilities: Not At Risk (10/05/2022)   AHC Utilities    Threatened with loss of utilities: No  Health Literacy: Not on file     Physical Exam   Vitals:   02/11/24 0033 02/11/24 0100  BP: 106/63 105/64  Pulse: 100 100  Resp: (!) 26 (!) 30  Temp:    SpO2: 91% 94%    CONSTITUTIONAL: Well-appearing, NAD NEURO/PSYCH:  Alert and oriented x 3, no focal deficits EYES:  eyes equal  and reactive ENT/NECK:  no LAD, no JVD CARDIO: Tachycardic rate, well-perfused, normal S1 and S2 PULM:  CTAB no wheezing or rhonchi GI/GU:  non-distended, non-tender MSK/SPINE:  No gross deformities, no edema SKIN:  no rash, atraumatic   *Additional and/or pertinent findings included in MDM below  Diagnostic and Interventional Summary    EKG Interpretation Date/Time:  Thursday February 10 2024 23:58:02 EST Ventricular Rate:  101 PR Interval:  156 QRS Duration:  79 QT Interval:  338 QTC Calculation: 439 R Axis:   48  Text Interpretation: Sinus tachycardia Baseline wander in lead(s) II V3 V4 V5 Confirmed by Theadore Sharper [phone]) on 02/11/2024 12:16:21 AM       Labs Reviewed  CBC - Abnormal; Notable for the following components:      Result Value   MCV 104.6 (*)    RDW 16.8 (*)    All other components within normal limits  COMPREHENSIVE METABOLIC PANEL WITH GFR - Abnormal; Notable for the following components:   Sodium 133 (*)    Glucose, Bld 114 (*)    Creatinine, Ser 1.62 (*)    AST 58 (*)    ALT 45 (*)    GFR, Estimated 39 (*)    All other components within normal limits  RESP PANEL BY RT-PCR (RSV, FLU A&B, COVID)  RVPGX2  CULTURE, BLOOD (ROUTINE X 2)  CULTURE, BLOOD (ROUTINE X 2)  LACTIC ACID, PLASMA  PROTIME-INR  POC URINE PREG, ED    DG Chest Port 1 View  Final Result      Medications  cefTRIAXone  (ROCEPHIN ) 2 g in sodium chloride  0.9 % 100 mL IVPB (0 g Intravenous Stopped 02/11/24 0040)  sodium chloride  0.9 % bolus 1,000 mL (1,000 mLs Intravenous New Bag/Given 02/11/24 0031)     Procedures  /  Critical Care .Critical Care  Performed by: Theadore Sharper HERO, MD Authorized by: Theadore Sharper HERO, MD   Critical care provider statement:    Critical care time (minutes):  45   Critical care was necessary to treat or prevent imminent or life-threatening deterioration of the following conditions:  Respiratory failure   Critical care was time spent personally by  me on the following activities:  Development of treatment plan with patient or surrogate, discussions with consultants, evaluation of patient's response to treatment, examination of patient, ordering and review of laboratory studies, ordering and review of radiographic studies, ordering and performing treatments and interventions, pulse oximetry, re-evaluation of patient's condition and review of old charts   ED Course and Medical Decision Making  Initial Impression and Ddx Patient  presenting with hypoxic respiratory failure, concern for flu versus pneumonia based on patient's constellation of symptoms.  Well-appearing, currently on 5 L nasal cannula, new oxygen requirement.  Not having any chest pain, overall doubt PE, doubt ACS.  Patient's most updated vitals revealing tachycardia up to 120, oral temperature 99.3, with obvious signs of infection on chest x-ray patient is now a code sepsis alert.  Past medical/surgical history that increases complexity of ED encounter: History of multiple myeloma  Interpretation of Diagnostics I personally reviewed the EKG and my interpretation is as follows: Sinus tachycardia  No significant blood count or electrolyte disturbance.  Patient Reassessment and Ultimate Disposition/Management     Plans for hospitalist admission.  Patient management required discussion with the following services or consulting groups:  Hospitalist Service  Complexity of Problems Addressed Acute illness or injury that poses threat of life of bodily function  Additional Data Reviewed and Analyzed Further history obtained from: Further history from spouse/family member  Additional Factors Impacting ED Encounter Risk Consideration of hospitalization  Ozell HERO. Theadore, MD [facility] Emergency Medicine [facility] [email or web address]  Final Clinical Impressions(s) / ED Diagnoses     ICD-10-CM   1. Multifocal pneumonia  J18.8       ED Discharge Orders      None        Discharge Instructions Discussed with and Provided to Patient:   Discharge Instructions   None      Theadore Ozell HERO, MD 02/11/24 0150  "

## 2024-02-11 NOTE — H&P (Addendum)
 " History and Physical    Patient: Stacey Montoya FMW:[id number] DOB: 1975/08/27 DOA: 02/10/2024 DOS: the patient was seen and examined on 02/11/2024 PCP: Shona Norleen PEDLAR, MD  Patient coming from: Home  Chief Complaint:  Chief Complaint  Patient presents with   Nasal Congestion   HPI: Stacey Montoya is a 48 y.o. female with medical history significant of multiple myeloma, VTE, hypertension, hyperlipidemia , severe peripheral neuropathy, resp failure from complex parapneumonic effusion in 09/2022, elevated creatinine (CKD not stated as official outpatient dx but per patient baseline creatinine 1.4). and vaping history.She presented to ED with reports of worsening cougj and congestion this evening. Sh states cough and congstion started on Monday with malaise and fatigue She was started on antibiotic and one dose steroid given in office. Symptoms continued to worsen Course in ED patient met SIRS criteria, Labs without leukocytosis (on revlimad), elevated creatinin 1.62, afebrile, negative covid/flu/rsv,  mild transaminitis,  multifocal pneumonia on chest xray resp rate in the 30's and new oxygen requirement up to 6L HFNC Review of Systems: As mentioned in the history of present illness. All other systems reviewed and are negative. Past Medical History:  Diagnosis Date   Abnormal Pap smear of cervix    Back pain    Cancer (HCC) 04/02/2020   multiple myeloma   Hyperlipidemia    Hypertension    Past Surgical History:  Procedure Laterality Date   CERVICAL CONIZATION W/BX  12/02/2010   Procedure: CONIZATION CERVIX WITH BIOPSY;  Surgeon: Bobie DELENA Cary;  Location: [facility];  Service: Gynecology;  Laterality: N/A;  COLD KNIFE   CESAREAN SECTION     DILATION AND CURETTAGE OF UTERUS  12/02/2010   Procedure: DILATATION AND CURETTAGE (D&C);  Surgeon: Bobie DELENA Cary;  Location: [facility];  Service: Gynecology;  Laterality: N/A;   IR RADIOLOGIST EVAL & MGMT  04/16/2020   KYPHOPLASTY Bilateral 09/05/2020    Procedure: KYPHOPLASTY THORACIC TWELVE AND LUMBAR FOUR;  Surgeon: Lanis Pupa, MD;  Location: [facility];  Service: Neurosurgery;  Laterality: Bilateral;   left hand     drain - infection   WISDOM TOOTH EXTRACTION     Social History:  reports that she has quit smoking. Her smoking use included cigarettes. She has a 10 pack-year smoking history. She has never used smokeless tobacco. She reports that she does not currently use alcohol. She reports that she does not currently use drugs. Noted vaping history per pulmonology evaluation outpatient 12/24/2022 Allergies[1]  Family History  Problem Relation Age of Onset   Lung cancer Maternal Grandfather    Pancreatic cancer Paternal Grandfather     Prior to Admission medications  Medication Sig Start Date End Date Taking? Authorizing Provider  acyclovir  (ZOVIRAX ) 400 MG tablet TAKE (1) TABLET BY MOUTH TWICE DAILY. 01/17/24  Yes Onesimo Emaline Brink, MD  albuterol  (VENTOLIN  HFA) [phone] Base) MCG/ACT inhaler Inhale 1-2 puffs into the lungs every 6 (six) hours as needed for wheezing. 02/01/20  Yes [provider]  amLODipine  (NORVASC ) 5 MG tablet Take 1 tablet (5 mg total) by mouth daily. HOLD until follow-up appointment with your PCP. 10/19/22 02/11/24 Yes Rai, Ripudeep K, MD  b complex vitamins capsule Take 1 capsule by mouth daily. 06/10/20  Yes Onesimo Emaline Brink, MD  ELIQUIS  2.5 MG TABS tablet TAKE ONE TABLET BY MOUTH TWICE DAILY 09/06/23  Yes Kale, Gautam Kishore, MD  ergocalciferol  (VITAMIN D2) 1.25 MG (50000 UT) capsule Take 1 capsule (50,000 Units total) by mouth once a  week. 10/19/22  Yes Rai, Ripudeep K, MD  gabapentin  (NEURONTIN ) 300 MG capsule TAKE 1 capSULES (300mg ) in the morning at 8AM, 1 capsULE (300mg ) at noon and 3 capsULES (900mg ) at bedtime 01/19/24  Yes Onesimo Emaline Brink, MD  lenalidomide  (REVLIMID ) 5 MG capsule TAKE 1 CAPSULE BY MOUTH DAILY  FOR 21 DAYS, THEN 7 DAYS OFF 01/19/24  Yes Onesimo Emaline Brink, MD   medroxyPROGESTERone  (PROVERA ) 10 MG tablet Take 10 mg by mouth daily.   Yes [provider]  Melatonin 10 MG TABS Take 20 mg by mouth at bedtime.   Yes [provider]  metoprolol  tartrate (LOPRESSOR ) 50 MG tablet Take 1 tablet (50 mg total) by mouth 2 (two) times daily. 10/19/22 02/11/24 Yes Rai, Ripudeep K, MD  pravastatin  (PRAVACHOL ) 40 MG tablet Take 40 mg by mouth daily.   Yes [provider]  prochlorperazine  (COMPAZINE ) 10 MG tablet Take 1 tablet (10 mg total) by mouth every 6 (six) hours as needed for nausea or vomiting. 04/05/23  Yes Onesimo Emaline Brink, MD  tiZANidine  (ZANAFLEX ) 2 MG tablet Take 1 tablet (2 mg total) by mouth 2 (two) times daily as needed for muscle spasms. 12/16/23  Yes Onesimo Emaline Brink, MD  traMADol  (ULTRAM ) 50 MG tablet TAKE ONE TABLET BY MOUTH EVERY 6 HOURS AS NEEDED. 01/21/24  Yes Onesimo Emaline Brink, MD  acetaminophen  (TYLENOL ) 500 MG tablet Take 2 tablets (1,000 mg total) by mouth in the morning and at bedtime. 03/16/23   Onesimo Emaline Brink, MD  docusate sodium  (COLACE) 100 MG capsule Take 1 capsule (100 mg total) by mouth 2 (two) times daily. Also available OTC. 10/19/22   Rai, Nydia POUR, MD  omeprazole (PRILOSEC) 20 MG capsule Take 20 mg by mouth daily. 09/11/22   [provider]  ondansetron  (ZOFRAN ) 4 MG tablet Take 2 tablets (8 mg total) by mouth every 8 (eight) hours as needed for nausea or vomiting. 10/19/22   Rai, Nydia POUR, MD  polyethylene glycol (MIRALAX  / GLYCOLAX ) 17 g packet Take 17 g by mouth daily as needed for mild constipation. 09/05/20   Lanis Pupa, MD    Physical Exam: Vitals:   02/11/24 0033 02/11/24 0100 02/11/24 0130 02/11/24 0200  BP: 106/63 105/64 (!) 123/99 107/70  Pulse: 100 100 99 96  Resp: (!) 26 (!) 30 (!) 33 (!) 27  Temp:      TempSrc:      SpO2: 91% 94% 91% 91%  Weight:      Height:       GENERAL- alert, co-operative, appears as stated age, mod distress HEENT- Atraumatic,  normocephalic, PERRL, EOMI, oral mucosa appears moist CARDIAC- tachycardia, no murmurs, rubs or gallops. RESP- Moving equal volumes of air, course through our, frequent deep  productive cough, requiring 6L NFNC at rest - desats quickly with movement in bed and rr 30 at rest ABDOMEN- Soft, nontender, bowel sounds present. NEURO- No obvious Cr N abnormality. EXTREMITIES- pulse 2+, symmetric, no pedal edema. SKIN- Warm, dry, No rash or PSYCH- Normal mood and affect, appropriate thought content and speech.   Data Reviewed: Chest xray reported with patchy airspace disease, unable to access image. EKG reviewed ST 119 no ischemic changes  Assessment and Plan: Sepsis  Multifocal pneumonia Received 1 L ns Additional 2.5 liters ordered to meet sepsis protocol rec  Lactic normal afebrile  CTA r/o PE and eval for complex effusions given history Resp viral panel 20 pathogen ordered given her risk factors - noted covid, flu and  rsv negative  Acute resp failure with hypoxia As above +  Duo nebs Solumedrol 125 mg IVx 1 f/b 60 mg daily Heated high flow oxygen supplementation, wean as tolerated Patient requests her pulmonologist be notified of her admission - Dr Darlean  AKI  2.5 L bolus f/b LR 75 ml /h Hold gabapentin  Avoid nephrotoxic agents  Transaminitis Mild likely dehydration/sepsis Fluid replenish and monitor Held statin  Multiple myeloma Continue revlimid   Neuropathy Gabapentin  on hold Continue tizanidine  and tramadol     Advance Care Planning: full  Consults: pulmonology per patient request  Family Communication: none at bedside  CRITICAL CARE Performed by: Erminio Cone   Total critical care time: 60 minutes  Critical care time was exclusive of separately billable procedures and treating other patients.  Critical care was necessary to treat or prevent imminent or life-threatening deterioration.  Critical care was time spent personally by me on the following  activities: development of treatment plan with patient and/or surrogate as well as nursing, discussions with consultants, evaluation of patient's response to treatment, examination of patient, obtaining history from patient or surrogate, ordering and performing treatments and interventions, ordering and review of laboratory studies, ordering and review of radiographic studies, pulse oximetry and re-evaluation of patient's condition.   Severity of Illness: The appropriate patient status for this patient is INPATIENT. Inpatient status is judged to be reasonable and necessary in order to provide the required intensity of service to ensure the patient's safety. The patient's presenting symptoms, physical exam findings, and initial radiographic and laboratory data in the context of their chronic comorbidities is felt to place them at high risk for further clinical deterioration. Furthermore, it is not anticipated that the patient will be medically stable for discharge from the hospital within 2 midnights of admission.   * I certify that at the point of admission it is my clinical judgment that the patient will require inpatient hospital care spanning beyond 2 midnights from the point of admission due to high intensity of service, high risk for further deterioration and high frequency of surveillance required.*  Author: Erminio Cone, NP 02/11/2024 3:12 AM  For on call review [email or web address].      [1]  Allergies Allergen Reactions   Duloxetine  Hcl Hives   Zithromax [Azithromycin] Rash   "

## 2024-02-11 NOTE — Progress Notes (Signed)
 Elink monitoring for the code sepsis protocol.

## 2024-02-11 NOTE — ED Notes (Addendum)
 This RN noted the pts SpO2 continued to drop to 91% on 5L [place]. This RN called Luke with Respiratory and made her aware and asked if we could place the pt on salter so the pts O2 could be increased to 6L [place] without causing the pts nose to dry out.

## 2024-02-12 ENCOUNTER — Other Ambulatory Visit: Payer: Self-pay | Admitting: Hematology

## 2024-02-12 DIAGNOSIS — J188 Other pneumonia, unspecified organism: Secondary | ICD-10-CM

## 2024-02-12 DIAGNOSIS — C9001 Multiple myeloma in remission: Secondary | ICD-10-CM

## 2024-02-12 DIAGNOSIS — R7401 Elevation of levels of liver transaminase levels: Secondary | ICD-10-CM | POA: Diagnosis not present

## 2024-02-12 DIAGNOSIS — N179 Acute kidney failure, unspecified: Secondary | ICD-10-CM | POA: Diagnosis not present

## 2024-02-12 LAB — COMPREHENSIVE METABOLIC PANEL WITH GFR
ALT: 45 U/L — ABNORMAL HIGH (ref 0–44)
AST: 32 U/L (ref 15–41)
Albumin: 3.3 g/dL — ABNORMAL LOW (ref 3.5–5.0)
Alkaline Phosphatase: 62 U/L (ref 38–126)
Anion gap: 6 (ref 5–15)
BUN: 15 mg/dL (ref 6–20)
CO2: 28 mmol/L (ref 22–32)
Calcium: 8.9 mg/dL (ref 8.9–10.3)
Chloride: 104 mmol/L (ref 98–111)
Creatinine, Ser: 1.32 mg/dL — ABNORMAL HIGH (ref 0.44–1.00)
GFR, Estimated: 50 mL/min — ABNORMAL LOW
Glucose, Bld: 133 mg/dL — ABNORMAL HIGH (ref 70–99)
Potassium: 4.6 mmol/L (ref 3.5–5.1)
Sodium: 138 mmol/L (ref 135–145)
Total Bilirubin: <0.2 mg/dL (ref 0.0–1.2)
Total Protein: 6.2 g/dL — ABNORMAL LOW (ref 6.5–8.1)

## 2024-02-12 LAB — CBC WITH DIFFERENTIAL/PLATELET
Abs Immature Granulocytes: 0.09 K/uL — ABNORMAL HIGH (ref 0.00–0.07)
Basophils Absolute: 0 K/uL (ref 0.0–0.1)
Basophils Relative: 0 %
Eosinophils Absolute: 0 K/uL (ref 0.0–0.5)
Eosinophils Relative: 0 %
HCT: 38 % (ref 36.0–46.0)
Hemoglobin: 12.2 g/dL (ref 12.0–15.0)
Immature Granulocytes: 2 %
Lymphocytes Relative: 6 %
Lymphs Abs: 0.4 K/uL — ABNORMAL LOW (ref 0.7–4.0)
MCH: 33 pg (ref 26.0–34.0)
MCHC: 32.1 g/dL (ref 30.0–36.0)
MCV: 102.7 fL — ABNORMAL HIGH (ref 80.0–100.0)
Monocytes Absolute: 0.4 K/uL (ref 0.1–1.0)
Monocytes Relative: 6 %
Neutro Abs: 5.2 K/uL (ref 1.7–7.7)
Neutrophils Relative %: 86 %
Platelets: 181 K/uL (ref 150–400)
RBC: 3.7 MIL/uL — ABNORMAL LOW (ref 3.87–5.11)
RDW: 17 % — ABNORMAL HIGH (ref 11.5–15.5)
WBC: 6 K/uL (ref 4.0–10.5)
nRBC: 0 % (ref 0.0–0.2)

## 2024-02-12 LAB — MAGNESIUM: Magnesium: 2.4 mg/dL (ref 1.7–2.4)

## 2024-02-12 LAB — PHOSPHORUS: Phosphorus: 2.4 mg/dL — ABNORMAL LOW (ref 2.5–4.6)

## 2024-02-12 LAB — GLUCOSE, CAPILLARY: Glucose-Capillary: 144 mg/dL — ABNORMAL HIGH (ref 70–99)

## 2024-02-12 MED ORDER — DM-GUAIFENESIN ER 30-600 MG PO TB12
1.0000 | ORAL_TABLET | Freq: Two times a day (BID) | ORAL | Status: DC
  Administered 2024-02-12 – 2024-02-15 (×7): 1 via ORAL
  Filled 2024-02-12 (×4): qty 1

## 2024-02-12 MED ORDER — METHYLPREDNISOLONE SODIUM SUCC 125 MG IJ SOLR
62.5000 mg | Freq: Two times a day (BID) | INTRAMUSCULAR | Status: DC
  Administered 2024-02-13 – 2024-02-14 (×4): 62.5 mg via INTRAVENOUS
  Filled 2024-02-12 (×2): qty 2

## 2024-02-12 MED ORDER — IPRATROPIUM-ALBUTEROL 0.5-2.5 (3) MG/3ML IN SOLN
3.0000 mL | Freq: Three times a day (TID) | RESPIRATORY_TRACT | Status: DC
  Administered 2024-02-12 – 2024-02-14 (×7): 3 mL via RESPIRATORY_TRACT
  Filled 2024-02-12 (×5): qty 3

## 2024-02-12 MED ORDER — SODIUM CHLORIDE 0.9 % IV SOLN
500.0000 mg | INTRAVENOUS | Status: DC
  Administered 2024-02-12 – 2024-02-14 (×3): 500 mg via INTRAVENOUS
  Filled 2024-02-12 (×2): qty 5

## 2024-02-12 MED ORDER — METHYLPREDNISOLONE SODIUM SUCC 40 MG IJ SOLR
40.0000 mg | Freq: Two times a day (BID) | INTRAMUSCULAR | Status: DC
  Administered 2024-02-12: 40 mg via INTRAVENOUS
  Filled 2024-02-12: qty 1

## 2024-02-12 MED ORDER — METHYLPREDNISOLONE SODIUM SUCC 125 MG IJ SOLR: 62.5000 mg | Freq: Two times a day (BID) | INTRAMUSCULAR | Status: DC

## 2024-02-12 NOTE — Plan of Care (Signed)
  Problem: Education: Goal: Knowledge of General Education information will improve Description: Including pain rating scale, medication(s)/side effects and non-pharmacologic comfort measures Outcome: Progressing   Problem: Coping: Goal: Level of anxiety will decrease Outcome: Progressing   Problem: Elimination: Goal: Will not experience complications related to urinary retention Outcome: Progressing   Problem: Safety: Goal: Ability to remain free from injury will improve Outcome: Progressing   Problem: Skin Integrity: Goal: Risk for impaired skin integrity will decrease Outcome: Progressing   

## 2024-02-12 NOTE — Plan of Care (Signed)
   Problem: Education: Goal: Knowledge of General Education information will improve Description: Including pain rating scale, medication(s)/side effects and non-pharmacologic comfort measures Outcome: Progressing   Problem: Clinical Measurements: Goal: Respiratory complications will improve Outcome: Progressing   Problem: Activity: Goal: Risk for activity intolerance will decrease Outcome: Progressing

## 2024-02-12 NOTE — Progress Notes (Addendum)
 " PROGRESS NOTE  Stacey Montoya, is a 48 y.o. female, DOB - 1975/02/19, FMW:[id number]  Admit date - 02/10/2024   Admitting Physician Posey Maier, DO  Outpatient Primary MD for the patient is Shona Norleen PEDLAR, MD  LOS - 1  Chief Complaint  Patient presents with   Nasal Congestion      Brief Narrative:  48 y.o. 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 , hypertension, hyperlipidemia , severe peripheral neuropathy, morbid obesity ongoing tobacco abuse admitted on 02/05/2024 with acute COPD exacerbation leading to acute hypoxic respiratory failure in the setting of parainfluenza virus infection    -Assessment and Plan: 1) acute COPD exacerbation--due to parainfluenza virus infection - COVID, RSV and flu negative - Respiratory panel with parainfluenza noted - Give Solu-Medrol , mucolytics, bronchodilators - Add azithromycin  for COPD exacerbation  2) parainfluenza 3 virus infection--viral respiratory panel noted from 02/11/2024 -- Symptomatic and supportive treatment  3) post parainfluenza pneumonia--patient is very symptomatic - Suspect superimposed bacterial infection - CT angio chest shows-Extensive bilateral pneumonia, with appearance favoring viral/atypical etiology.  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  Status is: Inpatient   Disposition: The patient is from: Home              Anticipated d/c is to: Home              Anticipated d/c date is: > 3 days              Patient currently is not medically stable to d/c. Barriers: Not Clinically Stable-   Code Status :  -  Code Status: Prior   Family Communication:    NA (patient is alert, awake and coherent)  -- Discussed with patient's mother and husband at bedside DVT Prophylaxis  :   - SCDs   apixaban  (ELIQUIS ) tablet 2.5 mg Start: 02/11/24 1000 apixaban  (ELIQUIS ) tablet 2.5 mg   Lab Results  Component Value Date   PLT 181 02/12/2024    Inpatient Medications  Scheduled Meds:  acyclovir   400 mg Oral BID   amLODipine   5 mg Oral Daily   apixaban   2.5 mg Oral BID   B-complex with vitamin C  1 tablet Oral Daily   Chlorhexidine  Gluconate Cloth  6 each Topical Daily   dextromethorphan -guaiFENesin   1 tablet Oral BID   feeding supplement  237 mL Oral BID BM   ipratropium-albuterol   3 mL Nebulization Q6H   medroxyPROGESTERone   10 mg Oral Daily   melatonin  6 mg Oral QHS   methylPREDNISolone  (SOLU-MEDROL ) injection  40 mg Intravenous Q12H   metoprolol  tartrate  50 mg Oral BID   nicotine   14 mg Transdermal Daily   [START ON 02/18/2024] Vitamin D  (Ergocalciferol )  50,000 Units Oral Weekly   Continuous Infusions:  azithromycin  500 mg (02/12/24 1152)   cefTRIAXone  (ROCEPHIN )  IV Stopped (02/11/24 2334)   PRN Meds:.acetaminophen , albuterol , bisacodyl , mouth rinse, prochlorperazine , senna-docusate, tiZANidine , traMADol    Anti-infectives (From admission, onward)    Start      Dose/Rate Route Frequency Ordered Stop   02/12/24 1200  azithromycin  (ZITHROMAX ) 500 mg in sodium chloride  0.9 % 250 mL IVPB        500 mg 250 mL/hr over 60 Minutes Intravenous Every 24 hours 02/12/24 1025     02/11/24 2300  cefTRIAXone  (ROCEPHIN ) 2 g in sodium chloride  0.9 % 100 mL IVPB        2 g 200 mL/hr over 30 Minutes Intravenous Every 24 hours 02/11/24 0417     02/11/24 1000  acyclovir  (ZOVIRAX ) tablet 400 mg        400 mg Oral 2 times daily 02/11/24 0437     02/11/24 0416  doxycycline  (VIBRAMYCIN ) 100 mg in sodium chloride  0.9 % 250 mL IVPB  Status:  Discontinued        100 mg 125 mL/hr over 120 Minutes Intravenous Every 12 hours 02/11/24 0417 02/12/24 1025   02/11/24 0000  cefTRIAXone  (ROCEPHIN ) 2 g in sodium chloride  0.9 % 100 mL IVPB        2 g 200 mL/hr over 30 Minutes Intravenous  Once 02/10/24 2345 02/11/24 0040         Subjective: [name] today has no fevers, no emesis,  No chest pain,  - Cough dyspnea and hypoxia persist - Patient's husband and mother at bedside, questions answered   Objective: Vitals:   02/12/24 1400 02/12/24 1500 02/12/24 1600 02/12/24 1616  BP: 119/78 (!) 146/80 (!) 153/78   Pulse: 98 100 (!) 105   Resp: (!) 27 (!) 28 (!) 31   Temp:    97.8 F (36.6 C)  TempSrc:    Axillary  SpO2: 92% 92% 95%   Weight:      Height:        Intake/Output Summary (Last 24 hours) at 02/12/2024 1710 Last data filed at 02/12/2024 0435 Gross per 24 hour  Intake 326.43 ml  Output --  Net 326.43 ml   Filed Weights   02/10/24 2252  Weight: 117.9 kg    Physical Exam  Gen:- Awake Alert, morbidly  obese with conversational dyspnea HEENT:- [place].AT, No sclera icterus Nose-- 8L/min Neck-Supple Neck,No JVD,.  Lungs-diminished breath sounds with few scattered wheezes  CV- S1, S2 normal, regular  Abd-  +ve B.Sounds, Abd Soft, No tenderness, increased truncal adiposity Extremity/Skin:- No  edema, pedal pulses present  Psych-affect is appropriate,  oriented x3 Neuro-no new focal deficits, no tremors  Data Reviewed: I have personally reviewed following labs and imaging studies  CBC: Recent Labs  Lab 02/11/24 0013 02/11/24 0517 02/12/24 0241  WBC 5.9 5.1 6.0  NEUTROABS  --  4.2 5.2  HGB 13.9 11.6* 12.2  HCT 43.4 35.0* 38.0  MCV 104.6* 100.9* 102.7*  PLT 165 153 181   Basic Metabolic Panel: Recent Labs  Lab 02/11/24 0013 02/11/24 0517 02/12/24 0241  NA 133* 136 138  K 4.8 3.9 4.6  CL 98 102 104  CO2 22 26 28   GLUCOSE 114* 104* 133*  BUN 12 11 15   CREATININE 1.62* 1.45* 1.32*  CALCIUM  9.1 8.0* 8.9  MG  --  1.6* 2.4  PHOS  --  2.5 2.4*   GFR: Estimated Creatinine Clearance: 66.1 mL/min (A) (by C-G formula based on SCr of 1.32 mg/dL (H)). Liver Function Tests: Recent Labs  Lab 02/11/24 0013 02/11/24 0517 02/12/24 0241  AST 58* 31 32  ALT 45* 39 45*  ALKPHOS 74 61 62  BILITOT 0.5 0.3 <0.2  PROT 7.2 5.8* 6.2*  ALBUMIN 3.8 3.4* 3.3*   BNP (last 3 results) Recent Labs    02/11/24 0517  PROBNP 769.0*   Recent Results (from the past 240 hours)  Resp panel by RT-PCR (RSV, Flu A&B, Covid) Anterior Nasal Swab     Status: None   Collection Time: 02/10/24 10:57 PM   Specimen: Anterior Nasal Swab  Result Value Ref Range Status   SARS Coronavirus 2 by RT PCR NEGATIVE NEGATIVE Final    Comment: (NOTE) SARS-CoV-2 target nucleic acids are NOT DETECTED.  The SARS-CoV-2 RNA is generally detectable in upper respiratory specimens during the acute phase of infection. The lowest concentration of SARS-CoV-2 viral copies this assay can detect is 138 copies/mL. A negative result does not preclude SARS-Cov-2 infection and should not be used as the sole basis for treatment or other patient management decisions. A negative result may occur with  improper specimen collection/handling, submission of specimen other than nasopharyngeal swab, presence of viral mutation(s) within the areas targeted by this assay, and inadequate  number of viral copies(<138 copies/mL). A negative result must be combined with clinical observations, patient history, and epidemiological information. The expected result is Negative.  Fact Sheet for Patients:  bloggercourse.com  Fact Sheet for Healthcare Providers:  seriousbroker.it  This test is no t yet approved or cleared by the [place]  FDA and  has been authorized for detection and/or diagnosis of SARS-CoV-2 by FDA under an Emergency Use Authorization (EUA). This EUA will remain  in effect (meaning this test can be used) for the duration of the COVID-19 declaration under Section 564(b)(1) of the Act, 21 U.S.C.section 360bbb-3(b)(1), unless the authorization is terminated  or revoked sooner.       Influenza A by PCR NEGATIVE NEGATIVE Final   Influenza B by PCR NEGATIVE NEGATIVE Final    Comment: (NOTE) The Xpert Xpress SARS-CoV-2/FLU/RSV plus assay is intended as an aid in the diagnosis of influenza from Nasopharyngeal swab specimens and should not be used as a sole basis for treatment. Nasal washings and aspirates are unacceptable for Xpert Xpress SARS-CoV-2/FLU/RSV testing.  Fact Sheet for Patients: bloggercourse.com  Fact Sheet for Healthcare Providers: seriousbroker.it  This test is not yet approved or cleared by the [place]  FDA and has been authorized for detection and/or diagnosis of SARS-CoV-2 by FDA under an Emergency Use Authorization (EUA). This EUA will remain in effect (meaning this test can be used) for the duration of the COVID-19 declaration under Section 564(b)(1) of the Act, 21 U.S.C. section 360bbb-3(b)(1), unless the authorization is terminated or revoked.     Resp Syncytial Virus by PCR NEGATIVE NEGATIVE Final    Comment: (NOTE) Fact Sheet for Patients: bloggercourse.com  Fact Sheet for Healthcare  Providers: seriousbroker.it  This test is not yet approved or cleared by the [place]  FDA and has been authorized for detection and/or diagnosis of SARS-CoV-2 by FDA under an Emergency Use Authorization (EUA). This EUA will remain in effect (meaning this test can be used) for the duration of the COVID-19 declaration under Section 564(b)(1) of the Act, 21 U.S.C. section 360bbb-3(b)(1), unless the authorization is terminated or  revoked.  Performed at [facility], [address] [place], [place] [address]   Blood Culture (routine x 2)     Status: None (Preliminary result)   Collection Time: 02/11/24 12:45 AM   Specimen: BLOOD  Result Value Ref Range Status   Specimen Description BLOOD BLOOD RIGHT HAND  Final   Special Requests   Final    BOTTLES DRAWN AEROBIC AND ANAEROBIC Blood Culture adequate volume   Culture   Final    NO GROWTH < 12 HOURS Performed at [facility], [address], [place], [place] [address]    Report Status PENDING  Incomplete  Blood Culture (routine x 2)     Status: None (Preliminary result)   Collection Time: 02/11/24 12:45 AM   Specimen: BLOOD  Result Value Ref Range Status   Specimen Description BLOOD BLOOD LEFT ARM  Final   Special Requests   Final    BOTTLES DRAWN AEROBIC AND ANAEROBIC Blood Culture adequate volume   Culture   Final    NO GROWTH < 12 HOURS Performed at [facility], [address], [place], [place] [address]    Report Status PENDING  Incomplete  MRSA Next Gen by PCR, Nasal     Status: None   Collection Time: 02/11/24  4:59 AM   Specimen: Nasal Mucosa; Nasal Swab  Result Value Ref Range Status   MRSA by PCR Next Gen NOT DETECTED NOT DETECTED Final    Comment: (NOTE) The GeneXpert MRSA Assay (FDA approved for NASAL specimens only), is one component of a comprehensive MRSA colonization surveillance program. It is not intended to diagnose MRSA infection nor to guide or monitor treatment for MRSA  infections. Test performance is not FDA approved in patients less than 30 years old. Performed at [facility], [address], [place], [place] [address]   Respiratory (~20 pathogens) panel by PCR     Status: Abnormal   Collection Time: 02/11/24  5:19 AM   Specimen: Nasopharyngeal Swab; Respiratory  Result Value Ref Range Status   Adenovirus NOT DETECTED NOT DETECTED Final   Coronavirus 229E NOT DETECTED NOT DETECTED Final    Comment: (NOTE) The Coronavirus on the Respiratory Panel, DOES NOT test for the novel  Coronavirus (2019 nCoV)    Coronavirus HKU1 NOT DETECTED NOT DETECTED Final   Coronavirus NL63 NOT DETECTED NOT DETECTED Final   Coronavirus OC43 NOT DETECTED NOT DETECTED Final   Metapneumovirus NOT DETECTED NOT DETECTED Final   Rhinovirus / Enterovirus NOT DETECTED NOT DETECTED Final   Influenza A NOT DETECTED NOT DETECTED Final   Influenza B NOT DETECTED NOT DETECTED Final   Parainfluenza Virus 1 NOT DETECTED NOT DETECTED Final   Parainfluenza Virus 2 NOT DETECTED NOT DETECTED Final   Parainfluenza Virus 3 DETECTED (A) NOT DETECTED Final   Parainfluenza Virus 4 NOT DETECTED NOT DETECTED Final   Respiratory Syncytial Virus NOT DETECTED NOT DETECTED Final   Bordetella pertussis NOT DETECTED NOT DETECTED Final   Bordetella Parapertussis NOT DETECTED NOT DETECTED Final   Chlamydophila pneumoniae NOT DETECTED NOT DETECTED Final   Mycoplasma pneumoniae NOT DETECTED NOT DETECTED Final    Comment: Performed at [facility], [address] [place], [place] [address]    Radiology Studies: ECHOCARDIOGRAM COMPLETE Result Date: 02/11/2024    ECHOCARDIOGRAM REPORT   Patient Name:   Yaiza M Skoda Date of Exam: 02/11/2024 Medical Rec #:  [id number]         Height:  63.0 in Accession #:    [id number]        Weight:       260.0 lb Date of Birth:  [birth year]         BSA:          2.162 m Patient Age:    48 years          BP:           118/64 mmHg Patient Gender: F                  HR:           88 bpm. Exam Location:  [facility] Procedure: 2D Echo, Cardiac Doppler and Color Doppler (Both Spectral and Color            Flow Doppler were utilized during procedure). Indications:    CHF  History:        Patient has prior history of Echocardiogram examinations, most                 recent 04/06/2020. Signs/Symptoms:Shortness of Breath.  Sonographer:    Philomena Daring Referring Phys: [id number] QJMYJW RASHID IMPRESSIONS  1. Left ventricular ejection fraction, by estimation, is 60 to 65%. The left ventricle has normal function. Left ventricular endocardial border not optimally defined to evaluate regional wall motion. Left ventricular diastolic parameters were normal.  2. Right ventricular systolic function is normal. The right ventricular size is normal. There is mildly elevated pulmonary artery systolic pressure. The estimated right ventricular systolic pressure is 40.0 mmHg.  3. The mitral valve is normal in structure. Mild mitral valve regurgitation. No evidence of mitral stenosis.  4. The aortic valve was not well visualized. Aortic valve regurgitation is not visualized. No aortic stenosis is present.  5. The inferior vena cava is dilated in size with <50% respiratory variability, suggesting right atrial pressure of 15 mmHg. Comparison(s): No significant change from prior study. FINDINGS  Left Ventricle: Left ventricular ejection fraction, by estimation, is 60 to 65%. The left ventricle has normal function. Left ventricular endocardial border not optimally defined to evaluate regional wall motion. Strain was performed and the global longitudinal strain is indeterminate. The left ventricular internal cavity size was normal in size. There is no left ventricular hypertrophy. Left ventricular diastolic parameters were normal. Right Ventricle: The right ventricular size is normal. No increase in right ventricular wall thickness. Right ventricular systolic function is normal. There is mildly elevated  pulmonary artery systolic pressure. The tricuspid regurgitant velocity is 2.50  m/s, and with an assumed right atrial pressure of 15 mmHg, the estimated right ventricular systolic pressure is 40.0 mmHg. Left Atrium: Left atrial size was normal in size. Right Atrium: Right atrial size was normal in size. Pericardium: There is no evidence of pericardial effusion. Mitral Valve: The mitral valve is normal in structure. Mild mitral valve regurgitation. No evidence of mitral valve stenosis. Tricuspid Valve: The tricuspid valve is not well visualized. Tricuspid valve regurgitation is mild . No evidence of tricuspid stenosis. Aortic Valve: The aortic valve was not well visualized. Aortic valve regurgitation is not visualized. No aortic stenosis is present. Pulmonic Valve: The pulmonic valve was not well visualized. Pulmonic valve regurgitation is not visualized. No evidence of pulmonic stenosis. Aorta: The aortic root is normal in size and structure. Venous: The inferior vena cava is dilated in size with less than 50% respiratory variability, suggesting right atrial pressure of 15 mmHg. IAS/Shunts: No atrial level shunt detected by color  flow Doppler. Additional Comments: 3D was performed not requiring image post processing on an independent workstation and was indeterminate.  LEFT VENTRICLE PLAX 2D LVIDd:         4.00 cm   Diastology LVIDs:         2.80 cm   LV e' medial:    6.96 cm/s LV PW:         1.00 cm   LV E/e' medial:  16.4 LV IVS:        1.10 cm   LV e' lateral:   10.10 cm/s LVOT diam:     2.00 cm   LV E/e' lateral: 11.3 LV SV:         80 LV SV Index:   37 LVOT Area:     3.14 cm  RIGHT VENTRICLE             IVC RV S prime:     11.50 cm/s  IVC diam: 2.30 cm TAPSE (M-mode): 2.4 cm LEFT ATRIUM             Index        RIGHT ATRIUM           Index LA diam:        3.70 cm 1.71 cm/m   RA Area:     11.30 cm LA Vol (A2C):   31.8 ml 14.71 ml/m  RA Volume:   23.30 ml  10.78 ml/m LA Vol (A4C):   33.3 ml 15.40 ml/m LA  Biplane Vol: 34.1 ml 15.77 ml/m  AORTIC VALVE LVOT Vmax:   133.00 cm/s LVOT Vmean:  89.900 cm/s LVOT VTI:    0.256 m  AORTA Ao Root diam: 3.10 cm Ao Asc diam:  3.20 cm MITRAL VALVE                TRICUSPID VALVE MV Area (PHT): 4.41 cm     TR Peak grad:   25.0 mmHg MV Decel Time: 172 msec     TR Vmax:        250.00 cm/s MV E velocity: 114.00 cm/s MV A velocity: 102.00 cm/s  SHUNTS MV E/A ratio:  1.12         Systemic VTI:  0.26 m                             Systemic Diam: 2.00 cm Vishnu Priya Mallipeddi Electronically signed by Diannah Late Mallipeddi Signature Date/Time: 02/11/2024/2:06:38 PM    Final    CT Angio Chest Pulmonary Embolism (PE) W or WO Contrast Result Date: 02/11/2024 EXAM: CTA CHEST 02/11/2024 04:41:22 AM TECHNIQUE: CTA of the chest was performed without and with the administration of 60 mL of iohexol  (OMNIPAQUE ) 350 MG/ML injection. Multiplanar reformatted images are provided for review. MIP images are provided for review. Automated exposure control, iterative reconstruction, and/or weight based adjustment of the mA/kV was utilized to reduce the radiation dose to as low as reasonably achievable. COMPARISON: Portable chest x-ray 02/10/2024. Prior chest CT 10/10/2022. CLINICAL HISTORY: 48 year old female with shortness of breath and congestion and history of multiple myeloma. FINDINGS: PULMONARY ARTERIES: Pulmonary arteries demonstrate suboptimal contrast bolus, but are adequately opacified for evaluation. No acute pulmonary embolus. Main pulmonary artery is normal in caliber. MEDIASTINUM: Normal heart size. No pericardial effusion. Calcified coronary artery atherosclerosis on series 4 image 69. Mild visible aortic atherosclerosis. LYMPH NODES: Increased, reactive appearing right paratracheal and mediastinal lymph nodes (individually up to 17 mm  short axis series 4 image 40). LUNGS AND PLEURA: Widespread bilateral abnormal lung opacity primarily in the form of peribronchial and geographic ground  glass (series 14 image 40) which is extensive in the upper lobes and especially on the right side. At both costophrenic angles there is early peribronchial consolidation. The major airways are patent with some airway thickening. Only trace pleural fluid. No pneumothorax. UPPER ABDOMEN: Early postcontrast appearance of the visible upper abdominal viscera is negative. SOFT TISSUES AND BONES: Widespread abnormal bone mineralization with numerous mildly expansile and lytic bone lesions in the posterior ribs, vertebral bodies, redemonstrated and not significantly changed from last year. Stable pathologic vertebral compression fractures at T7, T8, and T12 (that level previously augmented). IMPRESSION: 1. Extensive bilateral pneumonia, with appearance favoring viral/atypical etiology. Only trace pleural fluid, and no significant consolidation at this time. 2. No pulmonary embolism. 3. Reactive-appearing mediastinal lymphadenopathy. 4. Widespread multiple myeloma with extensive bone lesions not significantly progressed from last year. Electronically signed by: Helayne Hurst MD 02/11/2024 05:16 AM EST RP Workstation: [id number]   DG Chest Port 1 View Result Date: 02/10/2024 EXAM: PA AND LATERAL (2) VIEW(S) XRAY OF THE CHEST 02/10/2024 11:16:44 PM COMPARISON: PA and lateral chest 07/23/2023. CLINICAL HISTORY: SOB (shortness of breath). FINDINGS: LUNGS AND PLEURA: There is patchy airspace disease throughout the lungs consistent with multifocal pneumonia in this clinical setting. There is no substantial pleural effusion. No pneumothorax. HEART AND MEDIASTINUM: No acute abnormality of the cardiac and mediastinal silhouettes. BONES AND SOFT TISSUES: There are chronic healed fractures of the right rib cage. IMPRESSION: 1. Patchy airspace disease throughout the lungs consistent with multifocal pneumonia. 2. Follow up  study recommended after treatment to ensure clearing. Electronically signed by: Francis Quam MD 02/10/2024 11:21  PM EST RP Workstation: [id number]   Scheduled Meds:  acyclovir   400 mg Oral BID   amLODipine   5 mg Oral Daily   apixaban   2.5 mg Oral BID   B-complex with vitamin C  1 tablet Oral Daily   Chlorhexidine  Gluconate Cloth  6 each Topical Daily   dextromethorphan -guaiFENesin   1 tablet Oral BID   feeding supplement  237 mL Oral BID BM   ipratropium-albuterol   3 mL Nebulization Q6H   medroxyPROGESTERone   10 mg Oral Daily   melatonin  6 mg Oral QHS   methylPREDNISolone  (SOLU-MEDROL ) injection  40 mg Intravenous Q12H   metoprolol  tartrate  50 mg Oral BID   nicotine   14 mg Transdermal Daily   [START ON 02/18/2024] Vitamin D  (Ergocalciferol )  50,000 Units Oral Weekly   Continuous Infusions:  azithromycin  500 mg (02/12/24 1152)   cefTRIAXone  (ROCEPHIN )  IV Stopped (02/11/24 2334)    LOS: 1 day   Rendall Carwin M.D on 02/12/2024 at 5:10 PM  Go to [email or web address] - for contact info  [facility] - Office  [phone]  If 7PM-7AM, please contact night-coverage [email or web address] 02/12/2024, 5:10 PM    "

## 2024-02-13 ENCOUNTER — Inpatient Hospital Stay (HOSPITAL_COMMUNITY)

## 2024-02-13 DIAGNOSIS — N179 Acute kidney failure, unspecified: Secondary | ICD-10-CM | POA: Diagnosis not present

## 2024-02-13 DIAGNOSIS — J188 Other pneumonia, unspecified organism: Secondary | ICD-10-CM | POA: Diagnosis not present

## 2024-02-13 DIAGNOSIS — R7401 Elevation of levels of liver transaminase levels: Secondary | ICD-10-CM | POA: Diagnosis not present

## 2024-02-13 LAB — CBC WITH DIFFERENTIAL/PLATELET
Abs Immature Granulocytes: 0.03 K/uL (ref 0.00–0.07)
Basophils Absolute: 0 K/uL (ref 0.0–0.1)
Basophils Relative: 0 %
Eosinophils Absolute: 0 K/uL (ref 0.0–0.5)
Eosinophils Relative: 0 %
HCT: 35.9 % — ABNORMAL LOW (ref 36.0–46.0)
Hemoglobin: 11.6 g/dL — ABNORMAL LOW (ref 12.0–15.0)
Immature Granulocytes: 1 %
Lymphocytes Relative: 11 %
Lymphs Abs: 0.6 K/uL — ABNORMAL LOW (ref 0.7–4.0)
MCH: 32.7 pg (ref 26.0–34.0)
MCHC: 32.3 g/dL (ref 30.0–36.0)
MCV: 101.1 fL — ABNORMAL HIGH (ref 80.0–100.0)
Monocytes Absolute: 0.4 K/uL (ref 0.1–1.0)
Monocytes Relative: 8 %
Neutro Abs: 4.1 K/uL (ref 1.7–7.7)
Neutrophils Relative %: 80 %
Platelets: 183 K/uL (ref 150–400)
RBC: 3.55 MIL/uL — ABNORMAL LOW (ref 3.87–5.11)
RDW: 17.2 % — ABNORMAL HIGH (ref 11.5–15.5)
WBC: 5.1 K/uL (ref 4.0–10.5)
nRBC: 0 % (ref 0.0–0.2)

## 2024-02-13 LAB — COMPREHENSIVE METABOLIC PANEL WITH GFR
ALT: 91 U/L — ABNORMAL HIGH (ref 0–44)
AST: 54 U/L — ABNORMAL HIGH (ref 15–41)
Albumin: 3.3 g/dL — ABNORMAL LOW (ref 3.5–5.0)
Alkaline Phosphatase: 70 U/L (ref 38–126)
Anion gap: 7 (ref 5–15)
BUN: 26 mg/dL — ABNORMAL HIGH (ref 6–20)
CO2: 28 mmol/L (ref 22–32)
Calcium: 9.2 mg/dL (ref 8.9–10.3)
Chloride: 103 mmol/L (ref 98–111)
Creatinine, Ser: 1.27 mg/dL — ABNORMAL HIGH (ref 0.44–1.00)
GFR, Estimated: 53 mL/min — ABNORMAL LOW
Glucose, Bld: 116 mg/dL — ABNORMAL HIGH (ref 70–99)
Potassium: 4.5 mmol/L (ref 3.5–5.1)
Sodium: 138 mmol/L (ref 135–145)
Total Bilirubin: <0.2 mg/dL (ref 0.0–1.2)
Total Protein: 6.2 g/dL — ABNORMAL LOW (ref 6.5–8.1)

## 2024-02-13 LAB — GLUCOSE, CAPILLARY
Glucose-Capillary: 114 mg/dL — ABNORMAL HIGH (ref 70–99)
Glucose-Capillary: 122 mg/dL — ABNORMAL HIGH (ref 70–99)
Glucose-Capillary: 141 mg/dL — ABNORMAL HIGH (ref 70–99)
Glucose-Capillary: 159 mg/dL — ABNORMAL HIGH (ref 70–99)
Glucose-Capillary: 97 mg/dL (ref 70–99)

## 2024-02-13 LAB — PHOSPHORUS: Phosphorus: 3.9 mg/dL (ref 2.5–4.6)

## 2024-02-13 LAB — MAGNESIUM: Magnesium: 2.3 mg/dL (ref 1.7–2.4)

## 2024-02-13 MED ORDER — HYDROCOD POLI-CHLORPHE POLI ER 10-8 MG/5ML PO SUER
5.0000 mL | Freq: Two times a day (BID) | ORAL | Status: DC | PRN
  Administered 2024-02-13 – 2024-02-14 (×2): 5 mL via ORAL
  Filled 2024-02-13: qty 5

## 2024-02-13 NOTE — Plan of Care (Signed)

## 2024-02-13 NOTE — Progress Notes (Signed)
 " PROGRESS NOTE  Stacey Montoya, is a 48 y.o. female, DOB - 08/03/1975, FMW:[id number]  Admit date - 02/10/2024   Admitting Physician Stacey Maier, DO  Outpatient Primary MD for the patient is Stacey Norleen PEDLAR, MD  LOS - 2  Chief Complaint  Patient presents with   Nasal Congestion      Brief Narrative:  48 y.o. 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 , hypertension, hyperlipidemia , severe peripheral neuropathy, morbid obesity ongoing tobacco abuse admitted on 02/05/2024 with acute COPD exacerbation leading to acute hypoxic respiratory failure in the setting of parainfluenza virus infection    -Assessment and Plan: 1) acute COPD exacerbation--due to parainfluenza virus infection - COVID, RSV and flu negative - Respiratory panel with parainfluenza noted - Continue Solu-Medrol , mucolytics, bronchodilators - Added azithromycin  for COPD exacerbation - Respiratory status and oxygen requirement improving as noted below in #4  2)Parainfluenza 3 virus infection--viral respiratory panel noted from 02/11/2024 -- Symptomatic and supportive treatment  3) post parainfluenza pneumonia--patient is very symptomatic - Suspect superimposed bacterial infection - CT angio chest shows-Extensive bilateral pneumonia, with appearance favoring viral/atypical etiology.  - Continue IV Rocephin  and azithromycin  - MRSA PCR negative so hold off on Vanco  4) acute hypoxic respiratory failure--due to #1 #2 #3 above- -Cough, dyspnea and hypoxia improving --Weaning down oxygen at this time--was on 8 L via nasal cannula overnight -Currently weaned down to 4 L =--Management as above  5) tobacco abuse--- complete abstinence from tobacco advised, nicotine  patch ordered  6) multiple myeloma--hold Revlimid  given acute infection as above  7)AKI----acute kidney injury on CKD stage -3A--due to dehydration - creatinine on admission= 1.62,, - baseline creatinine = 1.3-1.4   ,   -creatinine is now= back to baseline,  --renally adjust medications, avoid nephrotoxic agents / dehydration  / hypotension  8)H/o VTE-continue apixaban  - CTA chest this admission without acute PE  9)HTN--continue amlodipine  and metoprolol   10) hypophosphatemia/hypomagnesemia--replace and recheck  11)Morbid Obesity- -Low calorie diet, portion control and increase physical activity discussed with patient after resolution of acute illness -Body mass index is 46.06 kg/m.  Status is: Inpatient   Disposition: The patient is from: Home              Anticipated d/c is to: Home              Anticipated d/c date is: > 3 days              Patient currently is not medically stable to d/c. Barriers: Not Clinically Stable-   Code Status :  -  Code Status: Prior   Family Communication:    NA (patient is alert, awake and coherent)  -- Discussed with patient's mother and husband at bedside DVT Prophylaxis  :   - SCDs   apixaban  (ELIQUIS ) tablet 2.5 mg Start: 02/11/24 1000 apixaban  (ELIQUIS ) tablet 2.5 mg   Lab Results  Component Value Date   PLT 183 02/13/2024   Inpatient Medications  Scheduled Meds:  acyclovir   400 mg Oral BID   amLODipine   5 mg Oral Daily   apixaban   2.5 mg Oral BID   B-complex with vitamin C  1 tablet Oral Daily   Chlorhexidine  Gluconate Cloth  6 each Topical Daily   dextromethorphan -guaiFENesin   1 tablet Oral BID   feeding supplement  237 mL Oral BID BM   ipratropium-albuterol   3 mL Nebulization TID   medroxyPROGESTERone   10 mg Oral Daily   melatonin  6 mg Oral  QHS   methylPREDNISolone  (SOLU-MEDROL ) injection  62.5 mg Intravenous Q12H   metoprolol  tartrate  50 mg Oral BID   nicotine   14 mg Transdermal Daily   [START ON 02/18/2024] Vitamin D  (Ergocalciferol )  50,000 Units Oral Weekly   Continuous Infusions:  azithromycin  500 mg (02/13/24 1124)   cefTRIAXone  (ROCEPHIN )  IV Stopped (02/12/24 2355)   PRN Meds:.acetaminophen , albuterol , bisacodyl , mouth rinse,  prochlorperazine , senna-docusate, tiZANidine , traMADol    Anti-infectives (From admission, onward)    Start     Dose/Rate Route Frequency Ordered Stop   02/12/24 1200  azithromycin  (ZITHROMAX ) 500 mg in sodium chloride  0.9 % 250 mL IVPB        500 mg 250 mL/hr over 60 Minutes Intravenous Every 24 hours 02/12/24 1025     02/11/24 2300  cefTRIAXone  (ROCEPHIN ) 2 g in sodium chloride  0.9 % 100 mL IVPB        2 g 200 mL/hr over 30 Minutes Intravenous Every 24 hours 02/11/24 0417     02/11/24 1000  acyclovir  (ZOVIRAX ) tablet 400 mg        400 mg Oral 2 times daily 02/11/24 0437     02/11/24 0416  doxycycline  (VIBRAMYCIN ) 100 mg in sodium chloride  0.9 % 250 mL IVPB  Status:  Discontinued        100 mg 125 mL/hr over 120 Minutes Intravenous Every 12 hours 02/11/24 0417 02/12/24 1025   02/11/24 0000  cefTRIAXone  (ROCEPHIN ) 2 g in sodium chloride  0.9 % 100 mL IVPB        2 g 200 mL/hr over 30 Minutes Intravenous  Once 02/10/24 2345 02/11/24 0040      Subjective: Stacey Montoya today has no fevers, no emesis,  No chest pain,  - Cough, dyspnea and hypoxia improving --Weaning down oxygen at this time--was on 8 L via nasal cannula overnight -Currently weaned down to 4 L - Patient's mother at bedside, questions answered   Objective: Vitals:   02/13/24 1500 02/13/24 1559 02/13/24 1600 02/13/24 1615  BP: (!) 149/77  (!) 144/78   Pulse: 87  88   Resp: (!) 26  20   Temp:  97.8 F (36.6 C)    TempSrc:  Oral    SpO2: 97%  100% 94%  Weight:      Height:        Intake/Output Summary (Last 24 hours) at 02/13/2024 1642 Last data filed at 02/13/2024 1559 Gross per 24 hour  Intake 1190 ml  Output 1200 ml  Net -10 ml   Filed Weights   02/10/24 2252  Weight: 117.9 kg    Physical Exam  Gen:- Awake Alert, morbidly  obese with conversational dyspnea HEENT:- [place].AT, No sclera icterus Nose--[name] 8L/min Neck-Supple Neck,No JVD,.  Lungs-diminished breath sounds with few scattered wheezes   CV- S1, S2 normal, regular  Abd-  +ve B.Sounds, Abd Soft, No tenderness, increased truncal adiposity Extremity/Skin:- No  edema, pedal pulses present  Psych-affect is appropriate, oriented x3 Neuro-no new focal deficits, no tremors  Data Reviewed: I have personally reviewed following labs and imaging studies  CBC: Recent Labs  Lab 02/11/24 0013 02/11/24 0517 02/12/24 0241 02/13/24 0525  WBC 5.9 5.1 6.0 5.1  NEUTROABS  --  4.2 5.2 4.1  HGB 13.9 11.6* 12.2 11.6*  HCT 43.4 35.0* 38.0 35.9*  MCV 104.6* 100.9* 102.7* 101.1*  PLT 165 153 181 183   Basic Metabolic Panel: Recent Labs  Lab 02/11/24 0013 02/11/24 0517 02/12/24 0241 02/13/24 0525  NA 133* 136 138 138  K 4.8 3.9 4.6 4.5  CL 98 102 104 103  CO2 22 26 28 28   GLUCOSE 114* 104* 133* 116*  BUN 12 11 15  26*  CREATININE 1.62* 1.45* 1.32* 1.27*  CALCIUM  9.1 8.0* 8.9 9.2  MG  --  1.6* 2.4 2.3  PHOS  --  2.5 2.4* 3.9   GFR: Estimated Creatinine Clearance: 68.7 mL/min (A) (by C-G formula based on SCr of 1.27 mg/dL (H)). Liver Function Tests: Recent Labs  Lab 02/11/24 0013 02/11/24 0517 02/12/24 0241 02/13/24 0525  AST 58* 31 32 54*  ALT 45* 39 45* 91*  ALKPHOS 74 61 62 70  BILITOT 0.5 0.3 <0.2 <0.2  PROT 7.2 5.8* 6.2* 6.2*  ALBUMIN 3.8 3.4* 3.3* 3.3*   BNP (last 3 results) Recent Labs    02/11/24 0517  PROBNP 769.0*   Recent Results (from the past 240 hours)  Resp panel by RT-PCR (RSV, Flu A&B, Covid) Anterior Nasal Swab     Status: None   Collection Time: 02/10/24 10:57 PM   Specimen: Anterior Nasal Swab  Result Value Ref Range Status   SARS Coronavirus 2 by RT PCR NEGATIVE NEGATIVE Final    Comment: (NOTE) SARS-CoV-2 target nucleic acids are NOT DETECTED.  The SARS-CoV-2 RNA is generally detectable in upper respiratory specimens during the acute phase of infection. The lowest concentration of SARS-CoV-2 viral copies this assay can detect is 138 copies/mL. A negative result does not preclude  SARS-Cov-2 infection and should not be used as the sole basis for treatment or other patient management decisions. A negative result may occur with  improper specimen collection/handling, submission of specimen other than nasopharyngeal swab, presence of viral mutation(s) within the areas targeted by this assay, and inadequate number of viral copies(<138 copies/mL). A negative result must be combined with clinical observations, patient history, and epidemiological information. The expected result is Negative.  Fact Sheet for Patients:  bloggercourse.com  Fact Sheet for Healthcare Providers:  seriousbroker.it  This test is no t yet approved or cleared by the [place]  FDA and  has been authorized for detection and/or diagnosis of SARS-CoV-2 by FDA under an Emergency Use Authorization (EUA). This EUA will remain  in effect (meaning this test can be used) for the duration of the COVID-19 declaration under Section 564(b)(1) of the Act, 21 U.S.C.section 360bbb-3(b)(1), unless the authorization is terminated  or revoked sooner.       Influenza A by PCR NEGATIVE NEGATIVE Final   Influenza B by PCR NEGATIVE NEGATIVE Final    Comment: (NOTE) The Xpert Xpress SARS-CoV-2/FLU/RSV plus assay is intended as an aid in the diagnosis of influenza from Nasopharyngeal swab specimens and should not be used as a sole basis for treatment. Nasal washings and aspirates are unacceptable for Xpert Xpress SARS-CoV-2/FLU/RSV testing.  Fact Sheet for Patients: bloggercourse.com  Fact Sheet for Healthcare Providers: seriousbroker.it  This test is not yet approved or cleared by the [place]  FDA and has been authorized for detection and/or diagnosis of SARS-CoV-2 by FDA under an Emergency Use Authorization (EUA). This EUA will remain in effect (meaning this test can be used) for the duration of  the COVID-19 declaration under Section 564(b)(1) of the Act, 21 U.S.C. section 360bbb-3(b)(1), unless the authorization is terminated or revoked.     Resp Syncytial Virus by PCR NEGATIVE NEGATIVE Final    Comment: (NOTE) Fact Sheet for Patients: bloggercourse.com  Fact Sheet for Healthcare Providers: seriousbroker.it  This test is not yet approved or cleared by the  [place]  FDA and has been authorized for detection and/or diagnosis of SARS-CoV-2 by FDA under an Emergency Use Authorization (EUA). This EUA will remain in effect (meaning this test can be used) for the duration of the COVID-19 declaration under Section 564(b)(1) of the Act, 21 U.S.C. section 360bbb-3(b)(1), unless the authorization is terminated or revoked.  Performed at [facility], [address], [place], [place] [address]   Blood Culture (routine x 2)     Status: None (Preliminary result)   Collection Time: 02/11/24 12:45 AM   Specimen: BLOOD  Result Value Ref Range Status   Specimen Description BLOOD BLOOD RIGHT HAND  Final   Special Requests   Final    BOTTLES DRAWN AEROBIC AND ANAEROBIC Blood Culture adequate volume   Culture   Final    NO GROWTH 2 DAYS Performed at [facility], [address] [place], [place] [address]    Report Status PENDING  Incomplete  Blood Culture (routine x 2)     Status: None (Preliminary result)   Collection Time: 02/11/24 12:45 AM   Specimen: BLOOD  Result Value Ref Range Status   Specimen Description BLOOD BLOOD LEFT ARM  Final   Special Requests   Final    BOTTLES DRAWN AEROBIC AND ANAEROBIC Blood Culture adequate volume   Culture   Final    NO GROWTH 2 DAYS Performed at [facility], [address], [place], [place] [address]    Report Status PENDING  Incomplete  MRSA Next Gen by PCR, Nasal     Status: None   Collection Time: 02/11/24  4:59 AM   Specimen: Nasal Mucosa; Nasal Swab  Result Value Ref Range Status    MRSA by PCR Next Gen NOT DETECTED NOT DETECTED Final    Comment: (NOTE) The GeneXpert MRSA Assay (FDA approved for NASAL specimens only), is one component of a comprehensive MRSA colonization surveillance program. It is not intended to diagnose MRSA infection nor to guide or monitor treatment for MRSA infections. Test performance is not FDA approved in patients less than 106 years old. Performed at [facility], [address] [place], [place] [address]   Respiratory (~20 pathogens) panel by PCR     Status: Abnormal   Collection Time: 02/11/24  5:19 AM   Specimen: Nasopharyngeal Swab; Respiratory  Result Value Ref Range Status   Adenovirus NOT DETECTED NOT DETECTED Final   Coronavirus 229E NOT DETECTED NOT DETECTED Final    Comment: (NOTE) The Coronavirus on the Respiratory Panel, DOES NOT test for the novel  Coronavirus (2019 nCoV)    Coronavirus HKU1 NOT DETECTED NOT DETECTED Final   Coronavirus NL63 NOT DETECTED NOT DETECTED Final   Coronavirus OC43 NOT DETECTED NOT DETECTED Final   Metapneumovirus NOT DETECTED NOT DETECTED Final   Rhinovirus / Enterovirus NOT DETECTED NOT DETECTED Final   Influenza A NOT DETECTED NOT DETECTED Final   Influenza B NOT DETECTED NOT DETECTED Final   Parainfluenza Virus 1 NOT DETECTED NOT DETECTED Final   Parainfluenza Virus 2 NOT DETECTED NOT DETECTED Final   Parainfluenza Virus 3 DETECTED (A) NOT DETECTED Final   Parainfluenza Virus 4 NOT DETECTED NOT DETECTED Final   Respiratory Syncytial Virus NOT DETECTED NOT DETECTED Final   Bordetella pertussis NOT DETECTED NOT DETECTED Final   Bordetella Parapertussis NOT DETECTED NOT DETECTED Final   Chlamydophila pneumoniae NOT DETECTED NOT DETECTED Final   Mycoplasma pneumoniae NOT DETECTED NOT DETECTED Final    Comment: Performed at [facility], [address] [place], [place] [address]    Radiology Studies: No results found.  Scheduled Meds:  acyclovir   400 mg Oral BID   amLODipine   5 mg  Oral Daily   apixaban   2.5 mg Oral BID   B-complex with vitamin C  1 tablet Oral Daily   Chlorhexidine  Gluconate Cloth  6 each Topical Daily   dextromethorphan -guaiFENesin   1 tablet Oral BID   feeding supplement  237 mL Oral BID BM   ipratropium-albuterol   3 mL Nebulization TID   medroxyPROGESTERone   10 mg Oral Daily   melatonin  6 mg Oral QHS   methylPREDNISolone  (SOLU-MEDROL ) injection  62.5 mg Intravenous Q12H   metoprolol  tartrate  50 mg Oral BID   nicotine   14 mg Transdermal Daily   [START ON 02/18/2024] Vitamin D  (Ergocalciferol )  50,000 Units Oral Weekly   Continuous Infusions:  azithromycin  500 mg (02/13/24 1124)   cefTRIAXone  (ROCEPHIN )  IV Stopped (02/12/24 2355)    LOS: 2 days   Rendall Carwin M.D on 02/13/2024 at 4:42 PM  Go to [email or web address] - for contact info  [facility] - Office  [phone]  If 7PM-7AM, please contact night-coverage [email or web address] 02/13/2024, 4:42 PM    "

## 2024-02-13 NOTE — Progress Notes (Signed)
" °   02/13/24 2110  Provider Notification  Provider Name/Title Dr. Manfred  Date Provider Notified 02/13/24  Time Provider Notified 2110  Method of Notification Page  Notification Reason Other (Comment) (Pt states,  I'm having a hard time catching my breath. I'm having trouble recovering after I cough.)  Provider response See new orders    "

## 2024-02-14 ENCOUNTER — Telehealth: Payer: Self-pay

## 2024-02-14 DIAGNOSIS — N179 Acute kidney failure, unspecified: Secondary | ICD-10-CM | POA: Diagnosis not present

## 2024-02-14 DIAGNOSIS — J188 Other pneumonia, unspecified organism: Secondary | ICD-10-CM | POA: Diagnosis not present

## 2024-02-14 DIAGNOSIS — R7401 Elevation of levels of liver transaminase levels: Secondary | ICD-10-CM | POA: Diagnosis not present

## 2024-02-14 LAB — COMPREHENSIVE METABOLIC PANEL WITH GFR
ALT: 92 U/L — ABNORMAL HIGH (ref 0–44)
AST: 32 U/L (ref 15–41)
Albumin: 3.4 g/dL — ABNORMAL LOW (ref 3.5–5.0)
Alkaline Phosphatase: 63 U/L (ref 38–126)
Anion gap: 7 (ref 5–15)
BUN: 30 mg/dL — ABNORMAL HIGH (ref 6–20)
CO2: 30 mmol/L (ref 22–32)
Calcium: 9.3 mg/dL (ref 8.9–10.3)
Chloride: 101 mmol/L (ref 98–111)
Creatinine, Ser: 1.21 mg/dL — ABNORMAL HIGH (ref 0.44–1.00)
GFR, Estimated: 56 mL/min — ABNORMAL LOW
Glucose, Bld: 130 mg/dL — ABNORMAL HIGH (ref 70–99)
Potassium: 5 mmol/L (ref 3.5–5.1)
Sodium: 138 mmol/L (ref 135–145)
Total Bilirubin: 0.2 mg/dL (ref 0.0–1.2)
Total Protein: 6.2 g/dL — ABNORMAL LOW (ref 6.5–8.1)

## 2024-02-14 LAB — CBC WITH DIFFERENTIAL/PLATELET
Abs Immature Granulocytes: 0.02 K/uL (ref 0.00–0.07)
Basophils Absolute: 0 K/uL (ref 0.0–0.1)
Basophils Relative: 0 %
Eosinophils Absolute: 0 K/uL (ref 0.0–0.5)
Eosinophils Relative: 0 %
HCT: 35.4 % — ABNORMAL LOW (ref 36.0–46.0)
Hemoglobin: 11.5 g/dL — ABNORMAL LOW (ref 12.0–15.0)
Immature Granulocytes: 0 %
Lymphocytes Relative: 18 %
Lymphs Abs: 0.8 K/uL (ref 0.7–4.0)
MCH: 32.4 pg (ref 26.0–34.0)
MCHC: 32.5 g/dL (ref 30.0–36.0)
MCV: 99.7 fL (ref 80.0–100.0)
Monocytes Absolute: 0.4 K/uL (ref 0.1–1.0)
Monocytes Relative: 9 %
Neutro Abs: 3.4 K/uL (ref 1.7–7.7)
Neutrophils Relative %: 73 %
Platelets: 206 K/uL (ref 150–400)
RBC: 3.55 MIL/uL — ABNORMAL LOW (ref 3.87–5.11)
RDW: 17.2 % — ABNORMAL HIGH (ref 11.5–15.5)
WBC: 4.7 K/uL (ref 4.0–10.5)
nRBC: 0 % (ref 0.0–0.2)

## 2024-02-14 LAB — GLUCOSE, CAPILLARY
Glucose-Capillary: 119 mg/dL — ABNORMAL HIGH (ref 70–99)
Glucose-Capillary: 153 mg/dL — ABNORMAL HIGH (ref 70–99)
Glucose-Capillary: 160 mg/dL — ABNORMAL HIGH (ref 70–99)
Glucose-Capillary: 191 mg/dL — ABNORMAL HIGH (ref 70–99)

## 2024-02-14 LAB — PHOSPHORUS: Phosphorus: 5.4 mg/dL — ABNORMAL HIGH (ref 2.5–4.6)

## 2024-02-14 LAB — MAGNESIUM: Magnesium: 2.4 mg/dL (ref 1.7–2.4)

## 2024-02-14 MED ORDER — METHYLPREDNISOLONE SODIUM SUCC 40 MG IJ SOLR
40.0000 mg | Freq: Two times a day (BID) | INTRAMUSCULAR | Status: DC
  Administered 2024-02-15: 40 mg via INTRAVENOUS
  Filled 2024-02-14: qty 1

## 2024-02-14 NOTE — Plan of Care (Signed)

## 2024-02-14 NOTE — Telephone Encounter (Signed)
 T/C from pt to let us  know she has been in the hospital since 12/25 for double pneumonia.  She has not been taking her Revlimid  while in the hospital. Her physician wanted to monitor her kidney function and felt she should not be taking it Pt advised to contact the office at discharge and we will go from there with appts, meds etc. Pt with VU and agreed to this plan and had no further questions at this time.

## 2024-02-14 NOTE — Progress Notes (Signed)
 " PROGRESS NOTE  Stacey Montoya, is a 48 y.o. female, DOB - March 02, 1975, FMW:[id number]  Admit date - 02/10/2024   Admitting Physician Posey Maier, DO  Outpatient Primary MD for the patient is Shona Norleen PEDLAR, MD  LOS - 3  Chief Complaint  Patient presents with   Nasal Congestion      Brief Narrative:  48 y.o. 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 , hypertension, hyperlipidemia , severe peripheral neuropathy, morbid obesity ongoing tobacco abuse admitted on 02/05/2024 with acute COPD exacerbation leading to acute hypoxic respiratory failure in the setting of parainfluenza virus infection    -Assessment and Plan: 1) acute COPD exacerbation--due to parainfluenza virus infection - COVID, RSV and flu negative - Respiratory panel with parainfluenza noted - Continue Solu-Medrol , mucolytics, bronchodilators - Added azithromycin  for COPD exacerbation - Respiratory status and oxygen requirement improving as noted below in #4  2)Parainfluenza 3 virus infection--viral respiratory panel noted from 02/11/2024 -- Symptomatic and supportive treatment  3) post parainfluenza pneumonia--patient is very symptomatic - Suspect superimposed bacterial infection - CT angio chest shows-Extensive bilateral pneumonia, with appearance favoring viral/atypical etiology.  - Continue IV Rocephin  and azithromycin  - MRSA PCR negative so hold off on Vanco  4) acute hypoxic respiratory failure--due to #1 #2 #3 above- --Patient used CPAP overnight and feels much better today Cough and dyspnea has improved significantly -Hypoxia also improving nonresolving patient is being weaned off O2 =--Management as above  5) tobacco abuse--- complete abstinence from tobacco advised, nicotine  patch ordered  6) multiple myeloma--hold Revlimid  given acute infection as above  7)AKI----acute kidney injury on CKD stage -3A--due to dehydration - creatinine on admission= 1.62,, - baseline  creatinine = 1.3-1.4   ,  -creatinine is now= back to baseline,  --renally adjust medications, avoid nephrotoxic agents / dehydration  / hypotension  8)H/o VTE-continue apixaban  - CTA chest this admission without acute PE  9)HTN--continue amlodipine  and metoprolol   10) hypophosphatemia/hypomagnesemia--replace and recheck  11)Morbid Obesity- -Low calorie diet, portion control and increase physical activity discussed with patient after resolution of acute illness -Body mass index is 46.06 kg/m.  Status is: Inpatient   Disposition: The patient is from: Home              Anticipated d/c is to: Home              Anticipated d/c date is: 1 day              Patient currently is not medically stable to d/c. Barriers: Not Clinically Stable-   Code Status :  -  Code Status: Prior   Family Communication:    NA (patient is alert, awake and coherent)  -- Discussed with patient's mother and husband at bedside DVT Prophylaxis  :   - SCDs   apixaban  (ELIQUIS ) tablet 2.5 mg Start: 02/11/24 1000 apixaban  (ELIQUIS ) tablet 2.5 mg   Lab Results  Component Value Date   PLT 206 02/14/2024   Inpatient Medications  Scheduled Meds:  acyclovir   400 mg Oral BID   amLODipine   5 mg Oral Daily   apixaban   2.5 mg Oral BID   B-complex with vitamin C  1 tablet Oral Daily   Chlorhexidine  Gluconate Cloth  6 each Topical Daily   dextromethorphan -guaiFENesin   1 tablet Oral BID   feeding supplement  237 mL Oral BID BM   ipratropium-albuterol   3 mL Nebulization TID   medroxyPROGESTERone   10 mg Oral Daily   melatonin  6 mg Oral  QHS   [START ON 02/15/2024] methylPREDNISolone  (SOLU-MEDROL ) injection  40 mg Intravenous Q12H   metoprolol  tartrate  50 mg Oral BID   nicotine   14 mg Transdermal Daily   [START ON 02/18/2024] Vitamin D  (Ergocalciferol )  50,000 Units Oral Weekly   Continuous Infusions:  azithromycin  500 mg (02/14/24 1147)   cefTRIAXone  (ROCEPHIN )  IV 2 g (02/13/24 2325)   PRN  Meds:.acetaminophen , albuterol , bisacodyl , chlorpheniramine-HYDROcodone, mouth rinse, prochlorperazine , senna-docusate, tiZANidine , traMADol    Anti-infectives (From admission, onward)    Start     Dose/Rate Route Frequency Ordered Stop   02/12/24 1200  azithromycin  (ZITHROMAX ) 500 mg in sodium chloride  0.9 % 250 mL IVPB        500 mg 250 mL/hr over 60 Minutes Intravenous Every 24 hours 02/12/24 1025     02/11/24 2300  cefTRIAXone  (ROCEPHIN ) 2 g in sodium chloride  0.9 % 100 mL IVPB        2 g 200 mL/hr over 30 Minutes Intravenous Every 24 hours 02/11/24 0417     02/11/24 1000  acyclovir  (ZOVIRAX ) tablet 400 mg        400 mg Oral 2 times daily 02/11/24 0437     02/11/24 0416  doxycycline  (VIBRAMYCIN ) 100 mg in sodium chloride  0.9 % 250 mL IVPB  Status:  Discontinued        100 mg 125 mL/hr over 120 Minutes Intravenous Every 12 hours 02/11/24 0417 02/12/24 1025   02/11/24 0000  cefTRIAXone  (ROCEPHIN ) 2 g in sodium chloride  0.9 % 100 mL IVPB        2 g 200 mL/hr over 30 Minutes Intravenous  Once 02/10/24 2345 02/11/24 0040      Subjective: [name] today has no fevers, no emesis,  No chest pain,  - -Patient used CPAP overnight and feels much better today Cough and dyspnea has improved significantly -Hypoxia also improving nonresolving patient is being weaned off O2  - Patient's mother at bedside, questions answered No fever  Or chills   Objective: Vitals:   02/14/24 1400 02/14/24 1500 02/14/24 1600 02/14/24 1700  BP: 139/82 137/82 (!) 140/78 (!) 144/83  Pulse: 81 88 80 99  Resp:      Temp:      TempSrc:      SpO2: 92% 93% 92% 92%  Weight:      Height:        Intake/Output Summary (Last 24 hours) at 02/14/2024 1815 Last data filed at 02/14/2024 0956 Gross per 24 hour  Intake 460 ml  Output 1800 ml  Net -1340 ml   Filed Weights   02/10/24 2252  Weight: 117.9 kg    Physical Exam  Gen:- Awake Alert, morbidly  obese with conversational dyspnea HEENT:- [place].AT,  No sclera icterus Neck-Supple Neck,No JVD,.  Lungs-improving air movement , wheezing improving  CV- S1, S2 normal, regular  Abd-  +ve B.Sounds, Abd Soft, No tenderness, increased truncal adiposity Extremity/Skin:- No  edema, pedal pulses present  Psych-affect is appropriate, oriented x3 Neuro-no new focal deficits, no tremors  Data Reviewed: I have personally reviewed following labs and imaging studies  CBC: Recent Labs  Lab 02/11/24 0013 02/11/24 0517 02/12/24 0241 02/13/24 0525 02/14/24 0339  WBC 5.9 5.1 6.0 5.1 4.7  NEUTROABS  --  4.2 5.2 4.1 3.4  HGB 13.9 11.6* 12.2 11.6* 11.5*  HCT 43.4 35.0* 38.0 35.9* 35.4*  MCV 104.6* 100.9* 102.7* 101.1* 99.7  PLT 165 153 181 183 206   Basic Metabolic Panel: Recent Labs  Lab 02/11/24  0013 02/11/24 0517 02/12/24 0241 02/13/24 0525 02/14/24 0339  NA 133* 136 138 138 138  K 4.8 3.9 4.6 4.5 5.0  CL 98 102 104 103 101  CO2 22 26 28 28 30   GLUCOSE 114* 104* 133* 116* 130*  BUN 12 11 15  26* 30*  CREATININE 1.62* 1.45* 1.32* 1.27* 1.21*  CALCIUM  9.1 8.0* 8.9 9.2 9.3  MG  --  1.6* 2.4 2.3 2.4  PHOS  --  2.5 2.4* 3.9 5.4*   GFR: Estimated Creatinine Clearance: 72.1 mL/min (A) (by C-G formula based on SCr of 1.21 mg/dL (H)). Liver Function Tests: Recent Labs  Lab 02/11/24 0013 02/11/24 0517 02/12/24 0241 02/13/24 0525 02/14/24 0339  AST 58* 31 32 54* 32  ALT 45* 39 45* 91* 92*  ALKPHOS 74 61 62 70 63  BILITOT 0.5 0.3 <0.2 <0.2 0.2  PROT 7.2 5.8* 6.2* 6.2* 6.2*  ALBUMIN 3.8 3.4* 3.3* 3.3* 3.4*   BNP (last 3 results) Recent Labs    02/11/24 0517  PROBNP 769.0*   Recent Results (from the past 240 hours)  Resp panel by RT-PCR (RSV, Flu A&B, Covid) Anterior Nasal Swab     Status: None   Collection Time: 02/10/24 10:57 PM   Specimen: Anterior Nasal Swab  Result Value Ref Range Status   SARS Coronavirus 2 by RT PCR NEGATIVE NEGATIVE Final    Comment: (NOTE) SARS-CoV-2 target nucleic acids are NOT DETECTED.  The  SARS-CoV-2 RNA is generally detectable in upper respiratory specimens during the acute phase of infection. The lowest concentration of SARS-CoV-2 viral copies this assay can detect is 138 copies/mL. A negative result does not preclude SARS-Cov-2 infection and should not be used as the sole basis for treatment or other patient management decisions. A negative result may occur with  improper specimen collection/handling, submission of specimen other than nasopharyngeal swab, presence of viral mutation(s) within the areas targeted by this assay, and inadequate number of viral copies(<138 copies/mL). A negative result must be combined with clinical observations, patient history, and epidemiological information. The expected result is Negative.  Fact Sheet for Patients:  bloggercourse.com  Fact Sheet for Healthcare Providers:  seriousbroker.it  This test is no t yet approved or cleared by the [place]  FDA and  has been authorized for detection and/or diagnosis of SARS-CoV-2 by FDA under an Emergency Use Authorization (EUA). This EUA will remain  in effect (meaning this test can be used) for the duration of the COVID-19 declaration under Section 564(b)(1) of the Act, 21 U.S.C.section 360bbb-3(b)(1), unless the authorization is terminated  or revoked sooner.       Influenza A by PCR NEGATIVE NEGATIVE Final   Influenza B by PCR NEGATIVE NEGATIVE Final    Comment: (NOTE) The Xpert Xpress SARS-CoV-2/FLU/RSV plus assay is intended as an aid in the diagnosis of influenza from Nasopharyngeal swab specimens and should not be used as a sole basis for treatment. Nasal washings and aspirates are unacceptable for Xpert Xpress SARS-CoV-2/FLU/RSV testing.  Fact Sheet for Patients: bloggercourse.com  Fact Sheet for Healthcare Providers: seriousbroker.it  This test is not yet approved or  cleared by the [place]  FDA and has been authorized for detection and/or diagnosis of SARS-CoV-2 by FDA under an Emergency Use Authorization (EUA). This EUA will remain in effect (meaning this test can be used) for the duration of the COVID-19 declaration under Section 564(b)(1) of the Act, 21 U.S.C. section 360bbb-3(b)(1), unless the authorization is terminated or revoked.     Resp Syncytial  Virus by PCR NEGATIVE NEGATIVE Final    Comment: (NOTE) Fact Sheet for Patients: bloggercourse.com  Fact Sheet for Healthcare Providers: seriousbroker.it  This test is not yet approved or cleared by the [place]  FDA and has been authorized for detection and/or diagnosis of SARS-CoV-2 by FDA under an Emergency Use Authorization (EUA). This EUA will remain in effect (meaning this test can be used) for the duration of the COVID-19 declaration under Section 564(b)(1) of the Act, 21 U.S.C. section 360bbb-3(b)(1), unless the authorization is terminated or revoked.  Performed at [facility], [address], [place], [place] [address]   Blood Culture (routine x 2)     Status: None (Preliminary result)   Collection Time: 02/11/24 12:45 AM   Specimen: BLOOD  Result Value Ref Range Status   Specimen Description BLOOD BLOOD RIGHT HAND  Final   Special Requests   Final    BOTTLES DRAWN AEROBIC AND ANAEROBIC Blood Culture adequate volume   Culture   Final    NO GROWTH 3 DAYS Performed at [facility], [address], [place], [place] [address]    Report Status PENDING  Incomplete  Blood Culture (routine x 2)     Status: None (Preliminary result)   Collection Time: 02/11/24 12:45 AM   Specimen: BLOOD  Result Value Ref Range Status   Specimen Description BLOOD BLOOD LEFT ARM  Final   Special Requests   Final    BOTTLES DRAWN AEROBIC AND ANAEROBIC Blood Culture adequate volume   Culture   Final    NO GROWTH 3 DAYS Performed at [facility], [address] [place], [place] [address]    Report Status PENDING  Incomplete  MRSA Next Gen by PCR, Nasal     Status: None   Collection Time: 02/11/24  4:59 AM   Specimen: Nasal Mucosa; Nasal Swab  Result Value Ref Range Status   MRSA by PCR Next Gen NOT DETECTED NOT DETECTED Final    Comment: (NOTE) The GeneXpert MRSA Assay (FDA approved for NASAL specimens only), is one component of a comprehensive MRSA colonization surveillance program. It is not intended to diagnose MRSA infection nor to guide or monitor treatment for MRSA infections. Test performance is not FDA approved in patients less than 78 years old. Performed at [facility], [address] [place], [place] [address]   Respiratory (~20 pathogens) panel by PCR     Status: Abnormal   Collection Time: 02/11/24  5:19 AM   Specimen: Nasopharyngeal Swab; Respiratory  Result Value Ref Range Status   Adenovirus NOT DETECTED NOT DETECTED Final   Coronavirus 229E NOT DETECTED NOT DETECTED Final    Comment: (NOTE) The Coronavirus on the Respiratory Panel, DOES NOT test for the novel  Coronavirus (2019 nCoV)    Coronavirus HKU1 NOT DETECTED NOT DETECTED Final   Coronavirus NL63 NOT DETECTED NOT DETECTED Final   Coronavirus OC43 NOT DETECTED NOT DETECTED Final   Metapneumovirus NOT DETECTED NOT DETECTED Final   Rhinovirus / Enterovirus NOT DETECTED NOT DETECTED Final   Influenza A NOT DETECTED NOT DETECTED Final   Influenza B NOT DETECTED NOT DETECTED Final   Parainfluenza Virus 1 NOT DETECTED NOT DETECTED Final   Parainfluenza Virus 2 NOT DETECTED NOT DETECTED Final   Parainfluenza Virus 3 DETECTED (A) NOT DETECTED Final   Parainfluenza Virus 4 NOT DETECTED NOT DETECTED Final   Respiratory Syncytial Virus NOT DETECTED NOT DETECTED Final   Bordetella pertussis NOT DETECTED NOT DETECTED Final   Bordetella Parapertussis  NOT DETECTED NOT DETECTED Final   Chlamydophila pneumoniae NOT DETECTED NOT DETECTED Final   Mycoplasma  pneumoniae NOT DETECTED NOT DETECTED Final    Comment: Performed at [facility], [address], [place], [place] [address]    Radiology Studies: DG CHEST PORT 1 VIEW Result Date: 02/13/2024 EXAM: 1 VIEW(S) XRAY OF THE CHEST 02/13/2024 10:00:11 PM COMPARISON: Portable chest from 02/10/2024. CLINICAL HISTORY: Shortness of breath. History of multiple myeloma. FINDINGS: LUNGS AND PLEURA: There is patchy bilateral hazy airspace disease predominating in the lower zones, and having increased in the lower lung fields since 02/10/2024. No pleural effusion. No pneumothorax. HEART AND MEDIASTINUM: No acute abnormality of the cardiac and mediastinal silhouettes. BONES AND SOFT TISSUES: There is osteopenia. Expansile lesions of the posterior right 3rd and 4th and left posterior 3rd ribs are again shown consistent with multiple myeloma. Lytic lesion of the left scapular glenoid is again also noted. IMPRESSION: 1. Patchy bilateral hazy airspace disease predominating in the lower zones, increased since December 25. 2. Expansile lesions of the posterior right 3rd and 4th and left posterior 3rd ribs, consistent with multiple myeloma. 3. Lytic lesion of the left scapular glenoid, consistent with multiple myeloma. Electronically signed by: Francis Quam MD 02/13/2024 10:39 PM EST RP Workstation: [id number]   Scheduled Meds:  acyclovir   400 mg Oral BID   amLODipine   5 mg Oral Daily   apixaban   2.5 mg Oral BID   B-complex with vitamin C  1 tablet Oral Daily   Chlorhexidine  Gluconate Cloth  6 each Topical Daily   dextromethorphan -guaiFENesin   1 tablet Oral BID   feeding supplement  237 mL Oral BID BM   ipratropium-albuterol   3 mL Nebulization TID   medroxyPROGESTERone   10 mg Oral Daily   melatonin  6 mg Oral QHS   [START ON 02/15/2024] methylPREDNISolone  (SOLU-MEDROL ) injection  40 mg Intravenous Q12H   metoprolol  tartrate  50 mg Oral BID   nicotine   14 mg Transdermal Daily   [START ON 02/18/2024] Vitamin D   (Ergocalciferol )  50,000 Units Oral Weekly   Continuous Infusions:  azithromycin  500 mg (02/14/24 1147)   cefTRIAXone  (ROCEPHIN )  IV 2 g (02/13/24 2325)    LOS: 3 days   Rendall Carwin M.D on 02/14/2024 at 6:15 PM  Go to [email or web address] - for contact info  [facility] - Office  [phone]  If 7PM-7AM, please contact night-coverage [email or web address] 02/14/2024, 6:15 PM    "

## 2024-02-14 NOTE — Progress Notes (Signed)
" °   02/14/24 2210  BiPAP/CPAP/SIPAP  BiPAP/CPAP/SIPAP Pt Type Adult  BiPAP/CPAP/SIPAP DREAMSTATIOND  Mask Type Nasal mask  Mask Size Medium  IPAP 10 cmH20  EPAP 4 cmH2O  Flow Rate 3 lpm  Patient Home Machine No  Patient Home Mask No  Patient Home Tubing No  Auto Titrate No  Device Plugged into RED Power Outlet Yes  BiPAP/CPAP /SiPAP Vitals  Pulse Rate 91  Resp 20  SpO2 93 %  Bilateral Breath Sounds Expiratory wheezes;Diminished (wheeze is slight)    "

## 2024-02-14 NOTE — Evaluation (Signed)
 Physical Therapy Evaluation Patient Details Name: Stacey Montoya MRN: [id number] DOB: 02-05-1976 Today's Date: 02/14/2024  History of Present Illness  Stacey Montoya is a 48 y.o. female with medical history significant of multiple myeloma, VTE, hypertension, hyperlipidemia , severe peripheral neuropathy, resp failure from complex parapneumonic effusion in 09/2022, elevated creatinine (CKD not stated as official outpatient dx but per patient baseline creatinine 1.4). and vaping history.She presented to ED with reports of worsening cougj and congestion this evening. Sh states cough and congstion started on Monday with malaise and fatigue She was started on antibiotic and one dose steroid given in office. Symptoms continued to worsen  Course in ED patient met SIRS criteria, Labs without leukocytosis (on revlimad), elevated creatinin 1.62, afebrile, negative covid/flu/rsv,  mild transaminitis,  multifocal pneumonia on chest xray resp rate in the 30's and new oxygen requirement up to 6L HFNC   Clinical Impression  Patient functioning near baseline for functional mobility and gait other than limited mostly due to SOB with SpO2 dropping from 95% to 87% during ambulation, otherwise patient demonstrates good return for bed mobility, transfers and ambulating in room without AD. Patient encouraged to ambulate ad lib in room and with nursing staff as tolerated. Plan:  Patient discharged from physical therapy to care of nursing for ambulation daily as tolerated for length of stay.          If plan is discharge home, recommend the following: A little help with walking and/or transfers;Help with stairs or ramp for entrance;Assistance with cooking/housework   Can travel by private vehicle        Equipment Recommendations None recommended by PT  Recommendations for Other Services       Functional Status Assessment Patient has had a recent decline in their functional status and/or demonstrates limited  ability to make significant improvements in function in a reasonable and predictable amount of time     Precautions / Restrictions Precautions Precautions: None Restrictions Weight Bearing Restrictions Per Provider Order: No      Mobility  Bed Mobility Overal bed mobility: Modified Independent                  Transfers Overall transfer level: Independent                      Ambulation/Gait Ambulation/Gait assistance: Modified independent (Device/Increase time) Gait Distance (Feet): 25 Feet Assistive device: None Gait Pattern/deviations: WFL(Within Functional Limits) Gait velocity: decreased     General Gait Details: grossly WFL with good return for ambulating in room without AD, but limited mostly due to SOB with SpO2 dropping from 95% to 87%  Stairs            Wheelchair Mobility     Tilt Bed    Modified Rankin (Stroke Patients Only)       Balance Overall balance assessment: Independent                                           Pertinent Vitals/Pain Pain Assessment Pain Assessment: Faces Faces Pain Scale: Hurts a little bit Pain Location: back Pain Descriptors / Indicators: Discomfort Pain Intervention(s): Monitored during session    Home Living Family/patient expects to be discharged to:: Private residence Living Arrangements: Spouse/significant other Available Help at Discharge: Family;Available 24 hours/day Type of Home: House Home Access: Level entry     Alternate  Level Stairs-Number of Steps: 8 Home Layout: Two level;Able to live on main level with bedroom/bathroom;Bed/bath upstairs Home Equipment: Agricultural Consultant (2 wheels);Cane - single point;BSC/3in1;Shower seat - built in      Prior Function Prior Level of Function : Independent/Modified Independent             Mobility Comments: Community ambulation without AD ADLs Comments: Independent     Extremity/Trunk Assessment   Upper Extremity  Assessment Upper Extremity Assessment: Overall WFL for tasks assessed    Lower Extremity Assessment Lower Extremity Assessment: Overall WFL for tasks assessed    Cervical / Trunk Assessment Cervical / Trunk Assessment: Normal  Communication   Communication Communication: No apparent difficulties    Cognition Arousal: Alert Behavior During Therapy: WFL for tasks assessed/performed   PT - Cognitive impairments: No apparent impairments                         Following commands: Intact       Cueing Cueing Techniques: Verbal cues     General Comments      Exercises     Assessment/Plan    PT Assessment Patient does not need any further PT services  PT Problem List         PT Treatment Interventions      PT Goals (Current goals can be found in the Care Plan section)  Acute Rehab PT Goals Patient Stated Goal: return home with family to assist PT Goal Formulation: With patient/family Time For Goal Achievement: 02/14/24 Potential to Achieve Goals: Good    Frequency       Co-evaluation               AM-PAC PT 6 Clicks Mobility  Outcome Measure Help needed turning from your back to your side while in a flat bed without using bedrails?: None Help needed moving from lying on your back to sitting on the side of a flat bed without using bedrails?: None Help needed moving to and from a bed to a chair (including a wheelchair)?: None Help needed standing up from a chair using your arms (e.g., wheelchair or bedside chair)?: None Help needed to walk in hospital room?: A Little Help needed climbing 3-5 steps with a railing? : A Little 6 Click Score: 22    End of Session   Activity Tolerance: Patient tolerated treatment well;Patient limited by fatigue Patient left: in chair;with call bell/phone within reach;with family/visitor present Nurse Communication: Mobility status PT Visit Diagnosis: Unsteadiness on feet (R26.81);Other abnormalities of gait and  mobility (R26.89);Muscle weakness (generalized) (M62.81)    Time: 8866-8846 PT Time Calculation (min) (ACUTE ONLY): 20 min   Charges:   PT Evaluation $PT Eval Moderate Complexity: 1 Mod PT Treatments $Therapeutic Activity: 8-22 mins PT General Charges $$ ACUTE PT VISIT: 1 Visit         12:09 PM, 02/14/2024 Lynwood Music, MPT Physical Therapist with [facility] 336 [phone] office [phone] mobile phone

## 2024-02-15 ENCOUNTER — Inpatient Hospital Stay

## 2024-02-15 ENCOUNTER — Encounter: Payer: Self-pay | Admitting: Hematology

## 2024-02-15 LAB — CBC WITH DIFFERENTIAL/PLATELET
Abs Immature Granulocytes: 0.06 K/uL (ref 0.00–0.07)
Basophils Absolute: 0 K/uL (ref 0.0–0.1)
Basophils Relative: 0 %
Eosinophils Absolute: 0.2 K/uL (ref 0.0–0.5)
Eosinophils Relative: 3 %
HCT: 36.3 % (ref 36.0–46.0)
Hemoglobin: 12 g/dL (ref 12.0–15.0)
Immature Granulocytes: 1 %
Lymphocytes Relative: 20 %
Lymphs Abs: 1.3 K/uL (ref 0.7–4.0)
MCH: 32.6 pg (ref 26.0–34.0)
MCHC: 33.1 g/dL (ref 30.0–36.0)
MCV: 98.6 fL (ref 80.0–100.0)
Monocytes Absolute: 0.6 K/uL (ref 0.1–1.0)
Monocytes Relative: 9 %
Neutro Abs: 4.4 K/uL (ref 1.7–7.7)
Neutrophils Relative %: 67 %
Platelets: 209 K/uL (ref 150–400)
RBC: 3.68 MIL/uL — ABNORMAL LOW (ref 3.87–5.11)
RDW: 17.1 % — ABNORMAL HIGH (ref 11.5–15.5)
Smear Review: NORMAL
WBC: 6.6 K/uL (ref 4.0–10.5)
nRBC: 0 % (ref 0.0–0.2)

## 2024-02-15 LAB — GLUCOSE, CAPILLARY: Glucose-Capillary: 142 mg/dL — ABNORMAL HIGH (ref 70–99)

## 2024-02-15 LAB — MAGNESIUM: Magnesium: 2.3 mg/dL (ref 1.7–2.4)

## 2024-02-15 LAB — PHOSPHORUS: Phosphorus: 5.2 mg/dL — ABNORMAL HIGH (ref 2.5–4.6)

## 2024-02-15 MED ORDER — AMLODIPINE BESYLATE 5 MG PO TABS: 5.0000 mg | ORAL_TABLET | Freq: Every day | ORAL | 5 refills | Status: AC

## 2024-02-15 MED ORDER — NICOTINE 14 MG/24HR TD PT24: 14.0000 mg | MEDICATED_PATCH | Freq: Every day | TRANSDERMAL | 0 refills | Status: AC

## 2024-02-15 MED ORDER — GUAIFENESIN ER 600 MG PO TB12: 600.0000 mg | ORAL_TABLET | Freq: Two times a day (BID) | ORAL | 0 refills | Status: AC

## 2024-02-15 MED ORDER — CEPHALEXIN 500 MG PO CAPS: 500.0000 mg | ORAL_CAPSULE | Freq: Three times a day (TID) | ORAL | 0 refills | Status: AC

## 2024-02-15 MED ORDER — ALBUTEROL SULFATE HFA 108 (90 BASE) MCG/ACT IN AERS: 2.0000 | INHALATION_SPRAY | Freq: Four times a day (QID) | RESPIRATORY_TRACT | 2 refills | Status: AC | PRN

## 2024-02-15 MED ORDER — AZITHROMYCIN 500 MG PO TABS: 500.0000 mg | ORAL_TABLET | Freq: Every day | ORAL | 0 refills | Status: AC

## 2024-02-15 MED ORDER — METOPROLOL TARTRATE 50 MG PO TABS: 50.0000 mg | ORAL_TABLET | Freq: Two times a day (BID) | ORAL | 5 refills | Status: AC

## 2024-02-15 MED ORDER — PREDNISONE 20 MG PO TABS: 40.0000 mg | ORAL_TABLET | Freq: Every day | ORAL | 0 refills | Status: AC

## 2024-02-15 MED ORDER — OMEPRAZOLE 20 MG PO CPDR: 20.0000 mg | DELAYED_RELEASE_CAPSULE | Freq: Every day | ORAL | 5 refills | Status: AC

## 2024-02-15 NOTE — Discharge Instructions (Signed)
 1)Complete Abstinence from Tobacco/Nicotine  advised--okay to use over-the-counter Nicotine  patch to help you quit 2) please note that there has been changes to her medications 3) follow-up to primary care physician in about a week or so for recheck and reevaluation 4)Watch for bleeding while on Blood Thinners--watch for blood in your stool which can make your stool black, maroon, mahogany or red---, blood in your urine which can make your urine pink or red, nosebleeds , also watch for possible bruising -You are taking Apixaban /Eliquis --- which is a blood thinner--- be careful to avoid injury or falls 5)Avoid ibuprofen /Advil /Aleve/Motrin /Goody Powders/Naproxen/BC powders/Meloxicam/Diclofenac/Indomethacin and other Nonsteroidal anti-inflammatory medications as these will make you more likely to bleed and can cause stomach ulcers, can also cause Kidney problems.

## 2024-02-15 NOTE — Discharge Summary (Signed)
 "                                                                                 Stacey Montoya, is a 48 y.o. female  DOB Jun 27, 1975  MRN [id number].  Admission date:  02/10/2024  Admitting Physician  Posey Maier, DO  Discharge Date:  02/15/2024   Primary MD  Shona Norleen PEDLAR, MD  Recommendations for primary care physician for things to follow:  1)Complete Abstinence from Tobacco/Nicotine  advised--okay to use over-the-counter Nicotine  patch to help you quit 2) please note that there has been changes to her medications 3) follow-up to primary care physician in about a week or so for recheck and reevaluation 4)Watch for bleeding while on Blood Thinners--watch for blood in your stool which can make your stool black, maroon, mahogany or red---, blood in your urine which can make your urine pink or red, nosebleeds , also watch for possible bruising -You are taking Apixaban /Eliquis --- which is a blood thinner--- be careful to avoid injury or falls 5)Avoid ibuprofen /Advil /Aleve/Motrin Stacey Montoya/Naproxen/BC Montoya/Meloxicam/Diclofenac/Indomethacin and other Nonsteroidal anti-inflammatory medications as these will make you more likely to bleed and can cause stomach ulcers, can also cause Kidney problems.   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    Past Surgical History:  Procedure Laterality Date   CERVICAL CONIZATION W/BX  12/02/2010   Procedure: CONIZATION CERVIX WITH BIOPSY;  Surgeon: Bobie DELENA Cary;  Location: [facility];  Service:  Gynecology;  Laterality: N/A;  COLD KNIFE   CESAREAN SECTION     DILATION AND CURETTAGE OF UTERUS  12/02/2010   Procedure: DILATATION AND CURETTAGE (D&C);  Surgeon: Bobie DELENA Cary;  Location: [facility];  Service: Gynecology;  Laterality: N/A;   IR RADIOLOGIST EVAL & MGMT  04/16/2020   KYPHOPLASTY Bilateral 09/05/2020   Procedure: KYPHOPLASTY THORACIC TWELVE AND LUMBAR FOUR;  Surgeon: Lanis Pupa, MD;  Location: [facility];  Service: Neurosurgery;  Laterality: Bilateral;   left hand     drain - infection   WISDOM TOOTH EXTRACTION       HPI  from the history and physical done on the day of admission:   HPI: Stacey Montoya is a 48 y.o. female with medical history significant of multiple myeloma, VTE, hypertension, hyperlipidemia , severe peripheral neuropathy, resp failure from complex parapneumonic effusion in 09/2022, elevated creatinine (CKD not stated as official outpatient dx but per patient baseline creatinine 1.4). and vaping history.She presented to ED with reports of worsening cougj and congestion this evening. Sh states cough and congstion started on Monday with malaise and fatigue She was started on antibiotic and one dose steroid given in office. Symptoms continued to worsen Course  in ED patient met SIRS criteria, Labs without leukocytosis (on revlimad), elevated creatinin 1.62, afebrile, negative covid/flu/rsv,  mild transaminitis,  multifocal pneumonia on chest xray resp rate in the 30's and new oxygen requirement up to 6L HFNC Review of Systems: As mentioned in the history of present illness. All other systems reviewed and are negative.    Hospital Course:   Brief Narrative:  48 y.o. 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 , hypertension, hyperlipidemia , severe peripheral neuropathy, morbid obesity ongoing tobacco abuse admitted on 02/05/2024 with acute COPD exacerbation leading to acute hypoxic respiratory failure in the setting of parainfluenza  virus infection     -Assessment and Plan: 1) acute COPD exacerbation--due to parainfluenza virus infection - COVID, RSV and flu negative - Respiratory panel with parainfluenza noted - Treated with Solu-Medrol , mucolytics, bronchodilators - Added azithromycin  for COPD exacerbation - Respiratory status improved significantly with above measures - oxygen requirement improved----Hypoxia Resolved -Oxygen Sats 92% on RA-- No desaturation with ambulation on RA Ok to dc home on Prednisone    2)Parainfluenza 3 virus infection--Viral respiratory panel noted from 02/11/2024 -- Symptomatic and supportive treatment   3) post parainfluenza pneumonia--patient was very symptomatic - Suspect superimposed bacterial infection - CT angio chest shows-Extensive bilateral pneumonia, with appearance favoring viral/atypical etiology.  -Treated with  IV Rocephin  and azithromycin  - MRSA PCR negative so hold off on Vanco Overall much improved with above measures -Ok to dc home on Keflex , azithromycin  and Prednisone    4) acute hypoxic respiratory failure--due to #1 #2 #3 above- --Patient used CPAP overnight and feels much better today Cough and dyspnea has improved significantly Hypoxia Resolved -Oxygen Sats 92% on RA-- No desaturation with ambulation on RA =--Management as above   5)Tobacco abuse--- complete abstinence from tobacco advised, nicotine  patch ordered   6) multiple myeloma--hold Revlimid  given acute infection as above   7)AKI----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  12)Possible OSA-- -Patient used CPAP overnight and feels much better today -Recommend outpatient Sleep Studies    Disposition: The patient is from: Home              Anticipated d/c is to: Home  Discharge Condition: stable w/o Hypoxia  Follow UP   Follow-up Information     Shona Norleen PEDLAR, MD. Schedule an appointment as soon as possible for a visit.   Specialty: Internal Medicine Why: If symptoms worsen, As needed Contact information: [address] [place] [place] [address] [phone]                 Diet and Activity recommendation:  As advised  Discharge Instructions   Discharge Instructions     Call MD for:  difficulty breathing, headache or visual disturbances   Complete by: As directed    Call MD for:  persistant dizziness or light-headedness   Complete by: As directed    Call MD for:  persistant nausea and vomiting   Complete by: As directed    Call MD  for:  temperature >100.4   Complete by: As directed    Diet - low sodium heart healthy   Complete by: As directed    Discharge instructions   Complete by: As directed    1)Complete Abstinence from Tobacco/Nicotine  advised--okay to use over-the-counter Nicotine  patch to help you quit 2) please note that there has been changes to her medications 3) follow-up to primary care physician in about a week or so for recheck and reevaluation 4)Watch for bleeding while on Blood Thinners--watch for blood in your stool which can make your stool black, maroon, mahogany or red---, blood in your urine which can make your urine pink or red, nosebleeds , also watch for possible bruising -You are taking Apixaban /Eliquis --- which is a blood thinner--- be careful to avoid injury or falls 5)Avoid ibuprofen /Advil /Aleve/Motrin /Goody Montoya/Naproxen/BC Montoya/Meloxicam/Diclofenac/Indomethacin and  other Nonsteroidal anti-inflammatory medications as these will make you more likely to bleed and can cause stomach ulcers, can also cause Kidney problems.   Increase activity slowly   Complete by: As directed         Discharge Medications     Allergies as of 02/15/2024       Reactions   Duloxetine  Hcl Hives        Medication List     PAUSE taking these medications    lenalidomide  5 MG capsule Wait to take this until: February 28, 2024 Please hold Revlimid  until acute infection resolves completely Commonly known as: REVLIMID  TAKE 1 CAPSULE BY MOUTH DAILY  FOR 21 DAYS, THEN 7 DAYS OFF       TAKE these medications    acetaminophen  500 MG tablet Commonly known as: TYLENOL  Take 2 tablets (1,000 mg total) by mouth in the morning and at bedtime.   acyclovir  400 MG tablet Commonly known as: ZOVIRAX  TAKE (1) TABLET BY MOUTH TWICE DAILY.   albuterol  108 (90 Base) MCG/ACT inhaler Commonly known as: VENTOLIN  HFA Inhale 2 puffs into the lungs every 6 (six) hours as needed for wheezing. What changed: how much to take   amLODipine  5 MG tablet Commonly known as: NORVASC  Take 1 tablet (5 mg total) by mouth daily. For BP What changed: additional instructions   azithromycin  500 MG tablet Commonly known as: ZITHROMAX  Take 1 tablet (500 mg total) by mouth daily for 3 days.   b complex vitamins capsule Take 1 capsule by mouth daily.   cephALEXin  500 MG capsule Commonly known as: KEFLEX  Take 1 capsule (500 mg total) by mouth 3 (three) times daily for 5 days.   docusate sodium  100 MG capsule Commonly known as: COLACE Take 1 capsule (100 mg total) by mouth 2 (two) times daily. Also available OTC.   Eliquis  2.5 MG Tabs tablet Generic drug: apixaban  TAKE ONE TABLET BY MOUTH TWICE DAILY   ergocalciferol  1.25 MG (50000 UT) capsule Commonly known as: VITAMIN D2 Take 1 capsule (50,000 Units total) by mouth once a week. What changed:  when to take this additional  instructions   gabapentin  300 MG capsule Commonly known as: NEURONTIN  TAKE 1 capSULES (300mg ) in the morning at 8AM, 1 capsULE (300mg ) at noon and 3 capsULES (900mg ) at bedtime   guaiFENesin  600 MG 12 hr tablet Commonly known as: Mucinex  Take 1 tablet (600 mg total) by mouth 2 (two) times daily.   medroxyPROGESTERone  10 MG tablet Commonly known as: PROVERA  Take 10 mg by mouth daily.   Melatonin 10 MG Tabs Take 20 mg by mouth at bedtime.   metoprolol  tartrate 50 MG tablet  Commonly known as: LOPRESSOR  Take 1 tablet (50 mg total) by mouth 2 (two) times daily.   nicotine  14 mg/24hr patch Commonly known as: NICODERM CQ  - dosed in mg/24 hours Place 1 patch (14 mg total) onto the skin daily. Start taking on: February 16, 2024   omeprazole  20 MG capsule Commonly known as: PRILOSEC Take 1 capsule (20 mg total) by mouth daily.   ondansetron  4 MG tablet Commonly known as: ZOFRAN  Take 2 tablets (8 mg total) by mouth every 8 (eight) hours as needed for nausea or vomiting.   phentermine 37.5 MG tablet Commonly known as: ADIPEX-P Take 37.5 mg by mouth daily before breakfast.   polyethylene glycol 17 g packet Commonly known as: MIRALAX  / GLYCOLAX  Take 17 g by mouth daily as needed for mild constipation.   pravastatin  40 MG tablet Commonly known as: PRAVACHOL  Take 40 mg by mouth daily.   predniSONE  20 MG tablet Commonly known as: DELTASONE  Take 2 tablets (40 mg total) by mouth daily with breakfast for 5 days.   prochlorperazine  10 MG tablet Commonly known as: COMPAZINE  Take 1 tablet (10 mg total) by mouth every 6 (six) hours as needed for nausea or vomiting.   tiZANidine  2 MG tablet Commonly known as: ZANAFLEX  Take 1 tablet (2 mg total) by mouth 2 (two) times daily as needed for muscle spasms.   traMADol  50 MG tablet Commonly known as: ULTRAM  TAKE ONE TABLET BY MOUTH EVERY 6 HOURS AS NEEDED.       Major procedures and Radiology Reports - PLEASE review detailed and  final reports for all details, in brief -   DG CHEST PORT 1 VIEW Result Date: 02/13/2024 EXAM: 1 VIEW(S) XRAY OF THE CHEST 02/13/2024 10:00:11 PM COMPARISON: Portable chest from 02/10/2024. CLINICAL HISTORY: Shortness of breath. History of multiple myeloma. FINDINGS: LUNGS AND PLEURA: There is patchy bilateral hazy airspace disease predominating in the lower zones, and having increased in the lower lung fields since 02/10/2024. No pleural effusion. No pneumothorax. HEART AND MEDIASTINUM: No acute abnormality of the cardiac and mediastinal silhouettes. BONES AND SOFT TISSUES: There is osteopenia. Expansile lesions of the posterior right 3rd and 4th and left posterior 3rd ribs are again shown consistent with multiple myeloma. Lytic lesion of the left scapular glenoid is again also noted. IMPRESSION: 1. Patchy bilateral hazy airspace disease predominating in the lower zones, increased since December 25. 2. Expansile lesions of the posterior right 3rd and 4th and left posterior 3rd ribs, consistent with multiple myeloma. 3. Lytic lesion of the left scapular glenoid, consistent with multiple myeloma. Electronically signed by: Francis Quam MD 02/13/2024 10:39 PM EST RP Workstation: [id number]   ECHOCARDIOGRAM COMPLETE Result Date: 02/11/2024    ECHOCARDIOGRAM REPORT   Patient Name:   Rhyanna M Perlman Date of Exam: 02/11/2024 Medical Rec #:  [id number]         Height:       63.0 in Accession #:    [id number]        Weight:       260.0 lb Date of Birth:  06/19/1975         BSA:          2.162 m Patient Age:    48 years          BP:           118/64 mmHg Patient Gender: F                 HR:  88 bpm. Exam Location:  [facility] Procedure: 2D Echo, Cardiac Doppler and Color Doppler (Both Spectral and Color            Flow Doppler were utilized during procedure). Indications:    CHF  History:        Patient has prior history of Echocardiogram examinations, most                 recent 04/06/2020.  Signs/Symptoms:Shortness of Breath.  Sonographer:    Philomena Daring Referring Phys: [id number] QJMYJW RASHID IMPRESSIONS  1. Left ventricular ejection fraction, by estimation, is 60 to 65%. The left ventricle has normal function. Left ventricular endocardial border not optimally defined to evaluate regional wall motion. Left ventricular diastolic parameters were normal.  2. Right ventricular systolic function is normal. The right ventricular size is normal. There is mildly elevated pulmonary artery systolic pressure. The estimated right ventricular systolic pressure is 40.0 mmHg.  3. The mitral valve is normal in structure. Mild mitral valve regurgitation. No evidence of mitral stenosis.  4. The aortic valve was not well visualized. Aortic valve regurgitation is not visualized. No aortic stenosis is present.  5. The inferior vena cava is dilated in size with <50% respiratory variability, suggesting right atrial pressure of 15 mmHg. Comparison(s): No significant change from prior study. FINDINGS  Left Ventricle: Left ventricular ejection fraction, by estimation, is 60 to 65%. The left ventricle has normal function. Left ventricular endocardial border not optimally defined to evaluate regional wall motion. Strain was performed and the global longitudinal strain is indeterminate. The left ventricular internal cavity size was normal in size. There is no left ventricular hypertrophy. Left ventricular diastolic parameters were normal. Right Ventricle: The right ventricular size is normal. No increase in right ventricular wall thickness. Right ventricular systolic function is normal. There is mildly elevated pulmonary artery systolic pressure. The tricuspid regurgitant velocity is 2.50  m/s, and with an assumed right atrial pressure of 15 mmHg, the estimated right ventricular systolic pressure is 40.0 mmHg. Left Atrium: Left atrial size was normal in size. Right Atrium: Right atrial size was normal in size. Pericardium: There  is no evidence of pericardial effusion. Mitral Valve: The mitral valve is normal in structure. Mild mitral valve regurgitation. No evidence of mitral valve stenosis. Tricuspid Valve: The tricuspid valve is not well visualized. Tricuspid valve regurgitation is mild . No evidence of tricuspid stenosis. Aortic Valve: The aortic valve was not well visualized. Aortic valve regurgitation is not visualized. No aortic stenosis is present. Pulmonic Valve: The pulmonic valve was not well visualized. Pulmonic valve regurgitation is not visualized. No evidence of pulmonic stenosis. Aorta: The aortic root is normal in size and structure. Venous: The inferior vena cava is dilated in size with less than 50% respiratory variability, suggesting right atrial pressure of 15 mmHg. IAS/Shunts: No atrial level shunt detected by color flow Doppler. Additional Comments: 3D was performed not requiring image post processing on an independent workstation and was indeterminate.  LEFT VENTRICLE PLAX 2D LVIDd:         4.00 cm   Diastology LVIDs:         2.80 cm   LV e' medial:    6.96 cm/s LV PW:         1.00 cm   LV E/e' medial:  16.4 LV IVS:        1.10 cm   LV e' lateral:   10.10 cm/s LVOT diam:     2.00 cm  LV E/e' lateral: 11.3 LV SV:         80 LV SV Index:   37 LVOT Area:     3.14 cm  RIGHT VENTRICLE             IVC RV S prime:     11.50 cm/s  IVC diam: 2.30 cm TAPSE (M-mode): 2.4 cm LEFT ATRIUM             Index        RIGHT ATRIUM           Index LA diam:        3.70 cm 1.71 cm/m   RA Area:     11.30 cm LA Vol (A2C):   31.8 ml 14.71 ml/m  RA Volume:   23.30 ml  10.78 ml/m LA Vol (A4C):   33.3 ml 15.40 ml/m LA Biplane Vol: 34.1 ml 15.77 ml/m  AORTIC VALVE LVOT Vmax:   133.00 cm/s LVOT Vmean:  89.900 cm/s LVOT VTI:    0.256 m  AORTA Ao Root diam: 3.10 cm Ao Asc diam:  3.20 cm MITRAL VALVE                TRICUSPID VALVE MV Area (PHT): 4.41 cm     TR Peak grad:   25.0 mmHg MV Decel Time: 172 msec     TR Vmax:        250.00 cm/s MV  E velocity: 114.00 cm/s MV A velocity: 102.00 cm/s  SHUNTS MV E/A ratio:  1.12         Systemic VTI:  0.26 m                             Systemic Diam: 2.00 cm Vishnu Priya Mallipeddi Electronically signed by Diannah Late Mallipeddi Signature Date/Time: 02/11/2024/2:06:38 PM    Final    CT Angio Chest Pulmonary Embolism (PE) W or WO Contrast Result Date: 02/11/2024 EXAM: CTA CHEST 02/11/2024 04:41:22 AM TECHNIQUE: CTA of the chest was performed without and with the administration of 60 mL of iohexol  (OMNIPAQUE ) 350 MG/ML injection. Multiplanar reformatted images are provided for review. MIP images are provided for review. Automated exposure control, iterative reconstruction, and/or weight based adjustment of the mA/kV was utilized to reduce the radiation dose to as low as reasonably achievable. COMPARISON: Portable chest x-ray 02/10/2024. Prior chest CT 10/10/2022. CLINICAL HISTORY: 48 year old female with shortness of breath and congestion and history of multiple myeloma. FINDINGS: PULMONARY ARTERIES: Pulmonary arteries demonstrate suboptimal contrast bolus, but are adequately opacified for evaluation. No acute pulmonary embolus. Main pulmonary artery is normal in caliber. MEDIASTINUM: Normal heart size. No pericardial effusion. Calcified coronary artery atherosclerosis on series 4 image 69. Mild visible aortic atherosclerosis. LYMPH NODES: Increased, reactive appearing right paratracheal and mediastinal lymph nodes (individually up to 17 mm short axis series 4 image 40). LUNGS AND PLEURA: Widespread bilateral abnormal lung opacity primarily in the form of peribronchial and geographic ground glass (series 14 image 40) which is extensive in the upper lobes and especially on the right side. At both costophrenic angles there is early peribronchial consolidation. The major airways are patent with some airway thickening. Only trace pleural fluid. No pneumothorax. UPPER ABDOMEN: Early postcontrast appearance of the  visible upper abdominal viscera is negative. SOFT TISSUES AND BONES: Widespread abnormal bone mineralization with numerous mildly expansile and lytic bone lesions in the posterior ribs, vertebral bodies, redemonstrated and not significantly changed from last  year. Stable pathologic vertebral compression fractures at T7, T8, and T12 (that level previously augmented). IMPRESSION: 1. Extensive bilateral pneumonia, with appearance favoring viral/atypical etiology. Only trace pleural fluid, and no significant consolidation at this time. 2. No pulmonary embolism. 3. Reactive-appearing mediastinal lymphadenopathy. 4. Widespread multiple myeloma with extensive bone lesions not significantly progressed from last year. Electronically signed by: Helayne Hurst MD 02/11/2024 05:16 AM EST RP Workstation: [id number]   DG Chest Port 1 View Result Date: 02/10/2024 EXAM: PA AND LATERAL (2) VIEW(S) XRAY OF THE CHEST 02/10/2024 11:16:44 PM COMPARISON: PA and lateral chest 07/23/2023. CLINICAL HISTORY: SOB (shortness of breath). FINDINGS: LUNGS AND PLEURA: There is patchy airspace disease throughout the lungs consistent with multifocal pneumonia in this clinical setting. There is no substantial pleural effusion. No pneumothorax. HEART AND MEDIASTINUM: No acute abnormality of the cardiac and mediastinal silhouettes. BONES AND SOFT TISSUES: There are chronic healed fractures of the right rib cage. IMPRESSION: 1. Patchy airspace disease throughout the lungs consistent with multifocal pneumonia. 2. Follow up  study recommended after treatment to ensure clearing. Electronically signed by: Francis Quam MD 02/10/2024 11:21 PM EST RP Workstation: [id number]   Micro Results  Recent Results (from the past 240 hours)  Resp panel by RT-PCR (RSV, Flu A&B, Covid) Anterior Nasal Swab     Status: None   Collection Time: 02/10/24 10:57 PM   Specimen: Anterior Nasal Swab  Result Value Ref Range Status   SARS Coronavirus 2 by RT PCR NEGATIVE  NEGATIVE Final    Comment: (NOTE) SARS-CoV-2 target nucleic acids are NOT DETECTED.  The SARS-CoV-2 RNA is generally detectable in upper respiratory specimens during the acute phase of infection. The lowest concentration of SARS-CoV-2 viral copies this assay can detect is 138 copies/mL. A negative result does not preclude SARS-Cov-2 infection and should not be used as the sole basis for treatment or other patient management decisions. A negative result may occur with  improper specimen collection/handling, submission of specimen other than nasopharyngeal swab, presence of viral mutation(s) within the areas targeted by this assay, and inadequate number of viral copies(<138 copies/mL). A negative result must be combined with clinical observations, patient history, and epidemiological information. The expected result is Negative.  Fact Sheet for Patients:  bloggercourse.com  Fact Sheet for Healthcare Providers:  seriousbroker.it  This test is no t yet approved or cleared by the [place]  FDA and  has been authorized for detection and/or diagnosis of SARS-CoV-2 by FDA under an Emergency Use Authorization (EUA). This EUA will remain  in effect (meaning this test can be used) for the duration of the COVID-19 declaration under Section 564(b)(1) of the Act, 21 U.S.C.section 360bbb-3(b)(1), unless the authorization is terminated  or revoked sooner.       Influenza A by PCR NEGATIVE NEGATIVE Final   Influenza B by PCR NEGATIVE NEGATIVE Final    Comment: (NOTE) The Xpert Xpress SARS-CoV-2/FLU/RSV plus assay is intended as an aid in the diagnosis of influenza from Nasopharyngeal swab specimens and should not be used as a sole basis for treatment. Nasal washings and aspirates are unacceptable for Xpert Xpress SARS-CoV-2/FLU/RSV testing.  Fact Sheet for Patients: bloggercourse.com  Fact Sheet for Healthcare  Providers: seriousbroker.it  This test is not yet approved or cleared by the [place]  FDA and has been authorized for detection and/or diagnosis of SARS-CoV-2 by FDA under an Emergency Use Authorization (EUA). This EUA will remain in effect (meaning this test can be used) for the duration of the COVID-19 declaration under  Section 564(b)(1) of the Act, 21 U.S.C. section 360bbb-3(b)(1), unless the authorization is terminated or revoked.     Resp Syncytial Virus by PCR NEGATIVE NEGATIVE Final    Comment: (NOTE) Fact Sheet for Patients: bloggercourse.com  Fact Sheet for Healthcare Providers: seriousbroker.it  This test is not yet approved or cleared by the [place]  FDA and has been authorized for detection and/or diagnosis of SARS-CoV-2 by FDA under an Emergency Use Authorization (EUA). This EUA will remain in effect (meaning this test can be used) for the duration of the COVID-19 declaration under Section 564(b)(1) of the Act, 21 U.S.C. section 360bbb-3(b)(1), unless the authorization is terminated or revoked.  Performed at [facility], [address], [place], [place] [address]   Blood Culture (routine x 2)     Status: None (Preliminary result)   Collection Time: 02/11/24 12:45 AM   Specimen: BLOOD  Result Value Ref Range Status   Specimen Description BLOOD BLOOD RIGHT HAND  Final   Special Requests   Final    BOTTLES DRAWN AEROBIC AND ANAEROBIC Blood Culture adequate volume   Culture   Final    NO GROWTH 4 DAYS Performed at [facility], [address], [place], [place] [address]    Report Status PENDING  Incomplete  Blood Culture (routine x 2)     Status: None (Preliminary result)   Collection Time: 02/11/24 12:45 AM   Specimen: BLOOD  Result Value Ref Range Status   Specimen Description BLOOD BLOOD LEFT ARM  Final   Special Requests   Final    BOTTLES DRAWN AEROBIC AND ANAEROBIC Blood  Culture adequate volume   Culture   Final    NO GROWTH 4 DAYS Performed at [facility], [address], [place], [place] [address]    Report Status PENDING  Incomplete  MRSA Next Gen by PCR, Nasal     Status: None   Collection Time: 02/11/24  4:59 AM   Specimen: Nasal Mucosa; Nasal Swab  Result Value Ref Range Status   MRSA by PCR Next Gen NOT DETECTED NOT DETECTED Final    Comment: (NOTE) The GeneXpert MRSA Assay (FDA approved for NASAL specimens only), is one component of a comprehensive MRSA colonization surveillance program. It is not intended to diagnose MRSA infection nor to guide or monitor treatment for MRSA infections. Test performance is not FDA approved in patients less than 14 years old. Performed at [facility], [address] [place], [place] [address]   Respiratory (~20 pathogens) panel by PCR     Status: Abnormal   Collection Time: 02/11/24  5:19 AM   Specimen: Nasopharyngeal Swab; Respiratory  Result Value Ref Range Status   Adenovirus NOT DETECTED NOT DETECTED Final   Coronavirus 229E NOT DETECTED NOT DETECTED Final    Comment: (NOTE) The Coronavirus on the Respiratory Panel, DOES NOT test for the novel  Coronavirus (2019 nCoV)    Coronavirus HKU1 NOT DETECTED NOT DETECTED Final   Coronavirus NL63 NOT DETECTED NOT DETECTED Final   Coronavirus OC43 NOT DETECTED NOT DETECTED Final   Metapneumovirus NOT DETECTED NOT DETECTED Final   Rhinovirus / Enterovirus NOT DETECTED NOT DETECTED Final   Influenza A NOT DETECTED NOT DETECTED Final   Influenza B NOT DETECTED NOT DETECTED Final   Parainfluenza Virus 1 NOT DETECTED NOT DETECTED Final   Parainfluenza Virus 2 NOT DETECTED NOT DETECTED Final   Parainfluenza Virus 3 DETECTED (A) NOT DETECTED Final   Parainfluenza Virus 4 NOT DETECTED NOT DETECTED Final  Respiratory Syncytial Virus NOT DETECTED NOT DETECTED Final   Bordetella pertussis NOT DETECTED NOT DETECTED Final   Bordetella Parapertussis NOT DETECTED NOT  DETECTED Final   Chlamydophila pneumoniae NOT DETECTED NOT DETECTED Final   Mycoplasma pneumoniae NOT DETECTED NOT DETECTED Final    Comment: Performed at [facility], [address] [place], [place] [address]    Today   Subjective    Aneli Zara today has no new concerns   No fever  Or chills   No Nausea, Vomiting or Diarrhea  Ambulating in the hallways without desaturation O2 sat is 92% on room air -No further hypoxia      Patient has been seen and examined prior to discharge   Objective   Blood pressure 115/77, pulse 69, temperature 97.7 F (36.5 C), temperature source Oral, resp. rate 20, height 5' 3 (1.6 m), weight 117.3 kg, SpO2 97%.   Intake/Output Summary (Last 24 hours) at 02/15/2024 1220 Last data filed at 02/15/2024 0920 Gross per 24 hour  Intake 1230.42 ml  Output --  Net 1230.42 ml   Exam Gen:- Awake Alert, morbidly obese, no acute distress HEENT:- [place].AT, No sclera icterus Neck-Supple Neck,No JVD,.  Lungs-improved air movement, no wheezing  CV- S1, S2 normal, regular Abd-  +ve B.Sounds, Abd Soft, No tenderness, increased truncal adiposity    Extremity/Skin:- No  edema,   good pulses Psych-affect is appropriate, oriented x3 Neuro-no new focal deficits, no tremors    Data Review   CBC w Diff:  Lab Results  Component Value Date   WBC 6.6 02/15/2024   HGB 12.0 02/15/2024   HGB 14.3 11/23/2023   HCT 36.3 02/15/2024   PLT 209 02/15/2024   PLT 230 11/23/2023   LYMPHOPCT 20 02/15/2024   MONOPCT 9 02/15/2024   EOSPCT 3 02/15/2024   BASOPCT 0 02/15/2024   CMP:  Lab Results  Component Value Date   NA 138 02/14/2024   K 5.0 02/14/2024   CL 101 02/14/2024   CO2 30 02/14/2024   BUN 30 (H) 02/14/2024   CREATININE 1.21 (H) 02/14/2024   CREATININE 1.40 (H) 11/23/2023   PROT 6.2 (L) 02/14/2024   ALBUMIN 3.4 (L) 02/14/2024   BILITOT 0.2 02/14/2024   BILITOT 0.5 11/23/2023   ALKPHOS 63 02/14/2024   AST 32 02/14/2024   AST 19  11/23/2023   ALT 92 (H) 02/14/2024   ALT 28 11/23/2023   Total Discharge time is about 33 minutes  Rendall Carwin M.D on 02/15/2024 at 12:20 PM  Go to [email or web address] -  for contact info  [facility] - Office  [phone]   "

## 2024-02-15 NOTE — Progress Notes (Signed)
 Mobility Specialist Progress Note:    02/15/24 1000  Mobility  Activity Ambulated with assistance  Level of Assistance Independent  Assistive Device None  Distance Ambulated (ft) 40 ft  Range of Motion/Exercises Active;All extremities  Activity Response Tolerated well  Mobility Referral Yes  Mobility visit 1 Mobility  Mobility Specialist Start Time (ACUTE ONLY) 1000  Mobility Specialist Stop Time (ACUTE ONLY) 1020  Mobility Specialist Time Calculation (min) (ACUTE ONLY) 20 min   Pt received sitting EOB, MD requesting O2 test. Independently able to stand and ambulate with no AD. Tolerated well, SpO2 92% on RA at rest and HR 64 at rest. SpO2 dropped to 97% on Ra during ambulation and HR peaked at 90. Once in room pt took a break and O2 recovered to 92% on RA. Returned sitting EOB, family in room. All needs met.  Chaeli Judy Mobility Specialist Please contact via Special Educational Needs Teacher or  Rehab office at [phone]

## 2024-02-16 LAB — CULTURE, BLOOD (ROUTINE X 2)
Culture: NO GROWTH
Culture: NO GROWTH
Special Requests: ADEQUATE
Special Requests: ADEQUATE

## 2024-02-18 ENCOUNTER — Other Ambulatory Visit: Payer: Self-pay | Admitting: Hematology

## 2024-02-18 DIAGNOSIS — C9001 Multiple myeloma in remission: Secondary | ICD-10-CM

## 2024-02-28 ENCOUNTER — Encounter: Payer: Self-pay | Admitting: Hematology

## 2024-03-07 ENCOUNTER — Inpatient Hospital Stay

## 2024-03-09 ENCOUNTER — Other Ambulatory Visit: Payer: Self-pay

## 2024-03-12 ENCOUNTER — Other Ambulatory Visit: Payer: Self-pay | Admitting: Hematology

## 2024-03-15 ENCOUNTER — Other Ambulatory Visit: Payer: Self-pay

## 2024-03-15 ENCOUNTER — Encounter: Payer: Self-pay | Admitting: Hematology

## 2024-03-15 DIAGNOSIS — C9001 Multiple myeloma in remission: Secondary | ICD-10-CM

## 2024-03-16 ENCOUNTER — Encounter: Payer: Self-pay | Admitting: Hematology

## 2024-03-16 MED ORDER — TRAMADOL HCL 50 MG PO TABS: 50.0000 mg | ORAL_TABLET | Freq: Four times a day (QID) | ORAL | 0 refills | Status: AC | PRN

## 2024-03-17 ENCOUNTER — Other Ambulatory Visit: Payer: Self-pay

## 2024-03-17 DIAGNOSIS — C9001 Multiple myeloma in remission: Secondary | ICD-10-CM

## 2024-03-17 DIAGNOSIS — C7951 Secondary malignant neoplasm of bone: Secondary | ICD-10-CM

## 2024-03-21 ENCOUNTER — Other Ambulatory Visit: Payer: Self-pay

## 2024-03-21 ENCOUNTER — Inpatient Hospital Stay: Admitting: Hematology

## 2024-03-21 ENCOUNTER — Inpatient Hospital Stay

## 2024-04-17 ENCOUNTER — Inpatient Hospital Stay: Attending: Hematology

## 2024-04-17 ENCOUNTER — Inpatient Hospital Stay: Admitting: Hematology
